# Patient Record
Sex: Male | Born: 1945 | Race: White | Hispanic: No | Smoking: Former smoker
Health system: Southern US, Community
[De-identification: ages and names within clinical notes are randomized; demographics above are authoritative.]

## PROBLEM LIST (undated history)

## (undated) DIAGNOSIS — G4730 Sleep apnea, unspecified: Secondary | ICD-10-CM

## (undated) DIAGNOSIS — IMO0001 Reserved for inherently not codable concepts without codable children: Secondary | ICD-10-CM

## (undated) DIAGNOSIS — Z973 Presence of spectacles and contact lenses: Secondary | ICD-10-CM

## (undated) DIAGNOSIS — I2699 Other pulmonary embolism without acute cor pulmonale: Principal | ICD-10-CM

## (undated) DIAGNOSIS — I82409 Acute embolism and thrombosis of unspecified deep veins of unspecified lower extremity: Secondary | ICD-10-CM

## (undated) DIAGNOSIS — N138 Other obstructive and reflux uropathy: Secondary | ICD-10-CM

## (undated) DIAGNOSIS — D693 Immune thrombocytopenic purpura: Secondary | ICD-10-CM

## (undated) DIAGNOSIS — N401 Enlarged prostate with lower urinary tract symptoms: Secondary | ICD-10-CM

## (undated) DIAGNOSIS — M1990 Unspecified osteoarthritis, unspecified site: Secondary | ICD-10-CM

## (undated) DIAGNOSIS — Z974 Presence of external hearing-aid: Secondary | ICD-10-CM

## (undated) DIAGNOSIS — D6851 Activated protein C resistance: Secondary | ICD-10-CM

## (undated) DIAGNOSIS — G20 Parkinson's disease: Principal | ICD-10-CM

## (undated) DIAGNOSIS — K219 Gastro-esophageal reflux disease without esophagitis: Secondary | ICD-10-CM

## (undated) DIAGNOSIS — C801 Malignant (primary) neoplasm, unspecified: Secondary | ICD-10-CM

## (undated) DIAGNOSIS — H269 Unspecified cataract: Secondary | ICD-10-CM

## (undated) DIAGNOSIS — Z7901 Long term (current) use of anticoagulants: Secondary | ICD-10-CM

## (undated) DIAGNOSIS — I499 Cardiac arrhythmia, unspecified: Secondary | ICD-10-CM

## (undated) DIAGNOSIS — D126 Benign neoplasm of colon, unspecified: Secondary | ICD-10-CM

## (undated) DIAGNOSIS — K449 Diaphragmatic hernia without obstruction or gangrene: Secondary | ICD-10-CM

## (undated) DIAGNOSIS — L408 Other psoriasis: Secondary | ICD-10-CM

## (undated) DIAGNOSIS — K5792 Diverticulitis of intestine, part unspecified, without perforation or abscess without bleeding: Secondary | ICD-10-CM

## (undated) HISTORY — DX: Benign neoplasm of colon, unspecified: D12.6

## (undated) HISTORY — DX: Diverticulitis of intestine, part unspecified, without perforation or abscess without bleeding: K57.92

## (undated) HISTORY — PX: PROSTATE SURGERY: SHX751

## (undated) HISTORY — DX: Activated protein C resistance: D68.51

## (undated) HISTORY — PX: HERNIA REPAIR: SHX51

## (undated) HISTORY — DX: Long term (current) use of anticoagulants: Z79.01

## (undated) HISTORY — DX: Parkinson's disease: G20

## (undated) HISTORY — DX: Gastro-esophageal reflux disease without esophagitis: K21.9

## (undated) HISTORY — DX: Benign prostatic hyperplasia with lower urinary tract symptoms: N40.1

## (undated) HISTORY — DX: Other pulmonary embolism without acute cor pulmonale: I26.99

## (undated) HISTORY — DX: Other obstructive and reflux uropathy: N13.8

## (undated) HISTORY — DX: Diaphragmatic hernia without obstruction or gangrene: K44.9

## (undated) HISTORY — PX: COLONOSCOPY: SHX174

## (undated) HISTORY — DX: Other psoriasis: L40.8

---

## 1998-06-16 DIAGNOSIS — I2699 Other pulmonary embolism without acute cor pulmonale: Secondary | ICD-10-CM

## 1998-06-16 DIAGNOSIS — I82409 Acute embolism and thrombosis of unspecified deep veins of unspecified lower extremity: Secondary | ICD-10-CM

## 1998-06-16 HISTORY — DX: Acute embolism and thrombosis of unspecified deep veins of unspecified lower extremity: I82.409

## 1998-06-16 HISTORY — DX: Other pulmonary embolism without acute cor pulmonale: I26.99

## 1999-02-07 ENCOUNTER — Ambulatory Visit (HOSPITAL_COMMUNITY): Admission: RE | Admit: 1999-02-07 | Discharge: 1999-02-07 | Payer: Self-pay | Admitting: Gastroenterology

## 1999-02-07 ENCOUNTER — Encounter (INDEPENDENT_AMBULATORY_CARE_PROVIDER_SITE_OTHER): Payer: Self-pay | Admitting: Specialist

## 1999-06-03 ENCOUNTER — Ambulatory Visit (HOSPITAL_COMMUNITY): Admission: RE | Admit: 1999-06-03 | Discharge: 1999-06-03 | Payer: Self-pay | Admitting: Gastroenterology

## 1999-06-03 ENCOUNTER — Encounter (INDEPENDENT_AMBULATORY_CARE_PROVIDER_SITE_OTHER): Payer: Self-pay

## 1999-10-22 ENCOUNTER — Inpatient Hospital Stay (HOSPITAL_COMMUNITY): Admission: EM | Admit: 1999-10-22 | Discharge: 1999-10-28 | Payer: Self-pay | Admitting: Internal Medicine

## 2000-10-01 ENCOUNTER — Encounter (INDEPENDENT_AMBULATORY_CARE_PROVIDER_SITE_OTHER): Payer: Self-pay | Admitting: Specialist

## 2000-10-01 ENCOUNTER — Ambulatory Visit (HOSPITAL_COMMUNITY): Admission: RE | Admit: 2000-10-01 | Discharge: 2000-10-01 | Payer: Self-pay | Admitting: Gastroenterology

## 2001-06-16 DIAGNOSIS — D693 Immune thrombocytopenic purpura: Secondary | ICD-10-CM

## 2001-06-16 HISTORY — DX: Immune thrombocytopenic purpura: D69.3

## 2001-10-03 ENCOUNTER — Inpatient Hospital Stay (HOSPITAL_COMMUNITY): Admission: AD | Admit: 2001-10-03 | Discharge: 2001-10-07 | Payer: Self-pay | Admitting: Family Medicine

## 2001-10-14 ENCOUNTER — Encounter: Admission: RE | Admit: 2001-10-14 | Discharge: 2001-10-14 | Payer: Self-pay | Admitting: Infectious Diseases

## 2002-06-16 HISTORY — PX: LUNG REMOVAL, PARTIAL: SHX233

## 2003-01-06 ENCOUNTER — Ambulatory Visit (HOSPITAL_COMMUNITY): Admission: RE | Admit: 2003-01-06 | Discharge: 2003-01-06 | Payer: Self-pay | Admitting: Internal Medicine

## 2003-01-29 ENCOUNTER — Inpatient Hospital Stay (HOSPITAL_COMMUNITY): Admission: RE | Admit: 2003-01-29 | Discharge: 2003-02-04 | Payer: Self-pay | Admitting: Thoracic Surgery

## 2003-01-30 ENCOUNTER — Encounter (INDEPENDENT_AMBULATORY_CARE_PROVIDER_SITE_OTHER): Payer: Self-pay | Admitting: *Deleted

## 2003-01-30 ENCOUNTER — Encounter: Payer: Self-pay | Admitting: Thoracic Surgery

## 2003-01-31 ENCOUNTER — Encounter: Payer: Self-pay | Admitting: Thoracic Surgery

## 2003-02-01 ENCOUNTER — Encounter: Payer: Self-pay | Admitting: Thoracic Surgery

## 2003-02-02 ENCOUNTER — Encounter: Payer: Self-pay | Admitting: Thoracic Surgery

## 2003-02-03 ENCOUNTER — Encounter: Payer: Self-pay | Admitting: Thoracic Surgery

## 2003-02-04 ENCOUNTER — Encounter: Payer: Self-pay | Admitting: Thoracic Surgery

## 2003-02-10 ENCOUNTER — Encounter: Admission: RE | Admit: 2003-02-10 | Discharge: 2003-02-10 | Payer: Self-pay | Admitting: Thoracic Surgery

## 2003-02-10 ENCOUNTER — Encounter: Payer: Self-pay | Admitting: Thoracic Surgery

## 2003-02-15 ENCOUNTER — Encounter: Admission: RE | Admit: 2003-02-15 | Discharge: 2003-02-15 | Payer: Self-pay | Admitting: Infectious Diseases

## 2003-03-02 ENCOUNTER — Encounter: Admission: RE | Admit: 2003-03-02 | Discharge: 2003-03-02 | Payer: Self-pay | Admitting: Thoracic Surgery

## 2003-03-02 ENCOUNTER — Encounter: Payer: Self-pay | Admitting: Thoracic Surgery

## 2003-03-30 ENCOUNTER — Encounter: Payer: Self-pay | Admitting: Thoracic Surgery

## 2003-03-30 ENCOUNTER — Encounter: Admission: RE | Admit: 2003-03-30 | Discharge: 2003-03-30 | Payer: Self-pay | Admitting: Thoracic Surgery

## 2003-04-17 ENCOUNTER — Encounter: Admission: RE | Admit: 2003-04-17 | Discharge: 2003-04-17 | Payer: Self-pay | Admitting: Infectious Diseases

## 2003-06-01 ENCOUNTER — Encounter: Admission: RE | Admit: 2003-06-01 | Discharge: 2003-06-01 | Payer: Self-pay | Admitting: Thoracic Surgery

## 2003-06-29 ENCOUNTER — Ambulatory Visit (HOSPITAL_BASED_OUTPATIENT_CLINIC_OR_DEPARTMENT_OTHER): Admission: RE | Admit: 2003-06-29 | Discharge: 2003-06-29 | Payer: Self-pay | Admitting: Emergency Medicine

## 2003-10-18 ENCOUNTER — Ambulatory Visit (HOSPITAL_COMMUNITY): Admission: RE | Admit: 2003-10-18 | Discharge: 2003-10-18 | Payer: Self-pay | Admitting: Gastroenterology

## 2003-10-18 ENCOUNTER — Encounter (INDEPENDENT_AMBULATORY_CARE_PROVIDER_SITE_OTHER): Payer: Self-pay | Admitting: Specialist

## 2003-11-29 ENCOUNTER — Encounter: Admission: RE | Admit: 2003-11-29 | Discharge: 2003-11-29 | Payer: Self-pay | Admitting: Thoracic Surgery

## 2004-05-30 ENCOUNTER — Ambulatory Visit: Payer: Self-pay | Admitting: *Deleted

## 2004-07-02 ENCOUNTER — Ambulatory Visit: Payer: Self-pay | Admitting: Internal Medicine

## 2004-07-30 ENCOUNTER — Ambulatory Visit: Payer: Self-pay | Admitting: Cardiology

## 2004-08-29 ENCOUNTER — Ambulatory Visit: Payer: Self-pay | Admitting: Cardiology

## 2004-10-01 ENCOUNTER — Ambulatory Visit: Payer: Self-pay | Admitting: Cardiology

## 2004-11-05 ENCOUNTER — Ambulatory Visit: Payer: Self-pay | Admitting: Internal Medicine

## 2004-12-10 ENCOUNTER — Ambulatory Visit: Payer: Self-pay | Admitting: Cardiology

## 2004-12-31 ENCOUNTER — Ambulatory Visit: Payer: Self-pay | Admitting: Cardiology

## 2005-01-28 ENCOUNTER — Ambulatory Visit: Payer: Self-pay | Admitting: Cardiology

## 2005-01-30 ENCOUNTER — Ambulatory Visit: Payer: Self-pay | Admitting: Internal Medicine

## 2005-02-25 ENCOUNTER — Ambulatory Visit: Payer: Self-pay | Admitting: *Deleted

## 2005-03-10 ENCOUNTER — Ambulatory Visit: Payer: Self-pay | Admitting: Internal Medicine

## 2005-04-01 ENCOUNTER — Ambulatory Visit: Payer: Self-pay | Admitting: Cardiology

## 2005-04-29 ENCOUNTER — Ambulatory Visit: Payer: Self-pay | Admitting: Internal Medicine

## 2005-05-27 ENCOUNTER — Ambulatory Visit: Payer: Self-pay | Admitting: Cardiology

## 2005-06-24 ENCOUNTER — Ambulatory Visit: Payer: Self-pay | Admitting: Cardiology

## 2005-07-22 ENCOUNTER — Ambulatory Visit: Payer: Self-pay | Admitting: Cardiology

## 2005-08-19 ENCOUNTER — Ambulatory Visit: Payer: Self-pay | Admitting: *Deleted

## 2005-08-28 ENCOUNTER — Ambulatory Visit: Payer: Self-pay | Admitting: Cardiology

## 2005-09-05 ENCOUNTER — Ambulatory Visit: Payer: Self-pay | Admitting: Cardiology

## 2005-09-16 ENCOUNTER — Ambulatory Visit: Payer: Self-pay

## 2005-10-02 ENCOUNTER — Ambulatory Visit: Payer: Self-pay | Admitting: Internal Medicine

## 2005-11-04 ENCOUNTER — Ambulatory Visit: Payer: Self-pay | Admitting: *Deleted

## 2005-12-01 ENCOUNTER — Ambulatory Visit: Payer: Self-pay | Admitting: Cardiology

## 2005-12-26 ENCOUNTER — Ambulatory Visit: Payer: Self-pay | Admitting: Cardiology

## 2006-01-23 ENCOUNTER — Ambulatory Visit: Payer: Self-pay | Admitting: Cardiology

## 2006-02-20 ENCOUNTER — Ambulatory Visit: Payer: Self-pay | Admitting: Cardiology

## 2006-03-17 ENCOUNTER — Ambulatory Visit: Payer: Self-pay | Admitting: *Deleted

## 2006-04-07 ENCOUNTER — Ambulatory Visit: Payer: Self-pay | Admitting: Internal Medicine

## 2006-05-05 ENCOUNTER — Ambulatory Visit: Payer: Self-pay | Admitting: Cardiovascular Disease

## 2006-06-02 ENCOUNTER — Ambulatory Visit: Payer: Self-pay | Admitting: Cardiology

## 2006-06-26 ENCOUNTER — Ambulatory Visit: Payer: Self-pay | Admitting: Internal Medicine

## 2006-07-23 ENCOUNTER — Ambulatory Visit: Payer: Self-pay | Admitting: Cardiology

## 2006-08-07 ENCOUNTER — Ambulatory Visit: Payer: Self-pay | Admitting: Internal Medicine

## 2007-03-03 ENCOUNTER — Observation Stay (HOSPITAL_COMMUNITY): Admission: AD | Admit: 2007-03-03 | Discharge: 2007-03-04 | Payer: Self-pay | Admitting: Emergency Medicine

## 2007-03-03 ENCOUNTER — Ambulatory Visit: Payer: Self-pay | Admitting: Family Medicine

## 2007-03-04 ENCOUNTER — Encounter: Payer: Self-pay | Admitting: Family Medicine

## 2007-03-04 ENCOUNTER — Ambulatory Visit: Payer: Self-pay | Admitting: Surgery

## 2007-03-08 ENCOUNTER — Encounter: Payer: Self-pay | Admitting: Cardiology

## 2007-03-08 LAB — CONVERTED CEMR LAB
Basophils Absolute: 0 10*3/uL (ref 0.0–0.1)
Basophils Relative: 0.4 % (ref 0.0–1.0)
Eosinophils Absolute: 0.8 10*3/uL — ABNORMAL HIGH (ref 0.0–0.6)
Eosinophils Relative: 13.5 % — ABNORMAL HIGH (ref 0.0–5.0)
HCT: 39.2 % (ref 39.0–52.0)
Hemoglobin: 13.3 g/dL (ref 13.0–17.0)
INR: 2.2 — ABNORMAL HIGH (ref 0.8–1.0)
Lymphocytes Relative: 29.4 % (ref 12.0–46.0)
MCHC: 33.9 g/dL (ref 30.0–36.0)
MCV: 95.6 fL (ref 78.0–100.0)
Monocytes Absolute: 0.5 10*3/uL (ref 0.2–0.7)
Monocytes Relative: 8.5 % (ref 3.0–11.0)
Neutro Abs: 2.7 10*3/uL (ref 1.4–7.7)
Neutrophils Relative %: 48.2 % (ref 43.0–77.0)
Platelets: 241 10*3/uL (ref 150–400)
Prothrombin Time: 18.6 s — ABNORMAL HIGH (ref 10.9–13.3)
RBC: 4.1 M/uL — ABNORMAL LOW (ref 4.22–5.81)
RDW: 12.7 % (ref 11.5–14.6)
WBC: 5.6 10*3/uL (ref 4.5–10.5)

## 2007-03-15 ENCOUNTER — Ambulatory Visit: Payer: Self-pay | Admitting: Cardiovascular Disease

## 2007-03-26 ENCOUNTER — Ambulatory Visit: Payer: Self-pay | Admitting: Cardiology

## 2007-04-06 ENCOUNTER — Ambulatory Visit: Payer: Self-pay | Admitting: Cardiology

## 2007-04-27 ENCOUNTER — Ambulatory Visit: Payer: Self-pay | Admitting: Cardiovascular Disease

## 2007-05-27 ENCOUNTER — Ambulatory Visit: Payer: Self-pay | Admitting: Cardiology

## 2007-06-24 ENCOUNTER — Ambulatory Visit: Payer: Self-pay | Admitting: Internal Medicine

## 2007-07-22 ENCOUNTER — Ambulatory Visit: Payer: Self-pay | Admitting: Cardiology

## 2007-08-19 ENCOUNTER — Ambulatory Visit: Payer: Self-pay | Admitting: Cardiovascular Disease

## 2007-09-16 ENCOUNTER — Ambulatory Visit: Payer: Self-pay | Admitting: Internal Medicine

## 2007-10-14 ENCOUNTER — Ambulatory Visit: Payer: Self-pay | Admitting: Cardiology

## 2007-11-11 ENCOUNTER — Ambulatory Visit: Payer: Self-pay | Admitting: Cardiology

## 2007-12-09 ENCOUNTER — Ambulatory Visit: Payer: Self-pay | Admitting: Cardiology

## 2007-12-09 LAB — CONVERTED CEMR LAB
INR: 6.5 (ref 0.8–1.0)
Prothrombin Time: 62.2 s (ref 10.9–13.3)

## 2007-12-14 ENCOUNTER — Ambulatory Visit: Payer: Self-pay | Admitting: Internal Medicine

## 2007-12-20 ENCOUNTER — Ambulatory Visit: Payer: Self-pay | Admitting: Internal Medicine

## 2007-12-24 ENCOUNTER — Ambulatory Visit: Payer: Self-pay | Admitting: Cardiovascular Disease

## 2007-12-31 ENCOUNTER — Ambulatory Visit: Payer: Self-pay | Admitting: Cardiology

## 2008-01-14 ENCOUNTER — Ambulatory Visit: Payer: Self-pay | Admitting: Cardiology

## 2008-02-11 ENCOUNTER — Ambulatory Visit: Payer: Self-pay | Admitting: Cardiology

## 2008-02-18 ENCOUNTER — Ambulatory Visit: Payer: Self-pay | Admitting: Cardiovascular Disease

## 2008-02-25 ENCOUNTER — Ambulatory Visit: Payer: Self-pay | Admitting: Internal Medicine

## 2008-03-03 ENCOUNTER — Ambulatory Visit: Payer: Self-pay | Admitting: Cardiology

## 2008-03-17 ENCOUNTER — Ambulatory Visit: Payer: Self-pay | Admitting: Cardiovascular Disease

## 2008-03-31 ENCOUNTER — Ambulatory Visit: Payer: Self-pay | Admitting: Cardiology

## 2008-04-28 ENCOUNTER — Ambulatory Visit: Payer: Self-pay | Admitting: Internal Medicine

## 2008-05-26 ENCOUNTER — Ambulatory Visit: Payer: Self-pay | Admitting: Cardiovascular Disease

## 2008-06-19 ENCOUNTER — Ambulatory Visit: Payer: Self-pay | Admitting: Internal Medicine

## 2008-07-17 ENCOUNTER — Ambulatory Visit: Payer: Self-pay | Admitting: Cardiology

## 2008-08-14 ENCOUNTER — Ambulatory Visit: Payer: Self-pay | Admitting: Cardiology

## 2008-09-08 ENCOUNTER — Ambulatory Visit: Payer: Self-pay | Admitting: Cardiology

## 2008-10-06 ENCOUNTER — Ambulatory Visit: Payer: Self-pay | Admitting: Cardiology

## 2008-10-27 ENCOUNTER — Ambulatory Visit: Payer: Self-pay | Admitting: Cardiology

## 2008-11-14 ENCOUNTER — Encounter: Payer: Self-pay | Admitting: *Deleted

## 2008-11-24 ENCOUNTER — Ambulatory Visit: Payer: Self-pay | Admitting: Cardiovascular Disease

## 2008-11-24 LAB — CONVERTED CEMR LAB
POC INR: 2.2
Protime: 18.2

## 2008-12-19 ENCOUNTER — Ambulatory Visit: Payer: Self-pay | Admitting: Cardiology

## 2008-12-19 LAB — CONVERTED CEMR LAB
POC INR: 2.4
Prothrombin Time: 18.8 s

## 2008-12-20 ENCOUNTER — Encounter: Payer: Self-pay | Admitting: *Deleted

## 2009-01-16 ENCOUNTER — Ambulatory Visit: Payer: Self-pay | Admitting: Cardiology

## 2009-01-16 LAB — CONVERTED CEMR LAB
POC INR: 1.9
Prothrombin Time: 17.1 s

## 2009-02-14 ENCOUNTER — Ambulatory Visit: Payer: Self-pay | Admitting: Internal Medicine

## 2009-02-14 LAB — CONVERTED CEMR LAB: POC INR: 3.1

## 2009-03-14 ENCOUNTER — Ambulatory Visit: Payer: Self-pay | Admitting: Cardiovascular Disease

## 2009-03-14 LAB — CONVERTED CEMR LAB: POC INR: 3

## 2009-04-06 ENCOUNTER — Ambulatory Visit: Payer: Self-pay | Admitting: Cardiology

## 2009-04-06 LAB — CONVERTED CEMR LAB: POC INR: 2.9

## 2009-05-04 ENCOUNTER — Ambulatory Visit: Payer: Self-pay | Admitting: Cardiology

## 2009-05-04 LAB — CONVERTED CEMR LAB: POC INR: 3.3

## 2009-06-01 ENCOUNTER — Ambulatory Visit: Payer: Self-pay | Admitting: Cardiovascular Disease

## 2009-06-01 LAB — CONVERTED CEMR LAB: POC INR: 2.9

## 2009-06-29 ENCOUNTER — Ambulatory Visit: Payer: Self-pay | Admitting: Internal Medicine

## 2009-07-27 ENCOUNTER — Encounter (INDEPENDENT_AMBULATORY_CARE_PROVIDER_SITE_OTHER): Payer: Self-pay | Admitting: Cardiology

## 2009-07-27 ENCOUNTER — Ambulatory Visit: Payer: Self-pay | Admitting: Internal Medicine

## 2009-08-16 ENCOUNTER — Ambulatory Visit: Payer: Self-pay | Admitting: Cardiology

## 2009-09-13 ENCOUNTER — Ambulatory Visit: Payer: Self-pay | Admitting: Internal Medicine

## 2009-10-11 ENCOUNTER — Ambulatory Visit: Payer: Self-pay | Admitting: Cardiology

## 2009-11-08 ENCOUNTER — Ambulatory Visit: Payer: Self-pay | Admitting: Cardiology

## 2009-11-08 LAB — CONVERTED CEMR LAB: POC INR: 2.4

## 2009-12-03 ENCOUNTER — Ambulatory Visit: Payer: Self-pay | Admitting: Vascular Surgery

## 2009-12-03 ENCOUNTER — Encounter (INDEPENDENT_AMBULATORY_CARE_PROVIDER_SITE_OTHER): Payer: Self-pay | Admitting: General Surgery

## 2009-12-03 ENCOUNTER — Inpatient Hospital Stay (HOSPITAL_COMMUNITY): Admission: EM | Admit: 2009-12-03 | Discharge: 2009-12-06 | Payer: Self-pay | Admitting: Emergency Medicine

## 2009-12-25 ENCOUNTER — Ambulatory Visit: Payer: Self-pay | Admitting: Cardiovascular Disease

## 2009-12-25 LAB — CONVERTED CEMR LAB: POC INR: 1.9

## 2010-01-18 ENCOUNTER — Ambulatory Visit: Payer: Self-pay | Admitting: Cardiovascular Disease

## 2010-02-15 ENCOUNTER — Ambulatory Visit: Payer: Self-pay | Admitting: Cardiology

## 2010-02-15 LAB — CONVERTED CEMR LAB
INR: 3.7
POC INR: 3.7

## 2010-02-25 ENCOUNTER — Ambulatory Visit: Payer: Self-pay | Admitting: Internal Medicine

## 2010-03-22 ENCOUNTER — Ambulatory Visit: Payer: Self-pay | Admitting: Cardiology

## 2010-04-19 ENCOUNTER — Ambulatory Visit: Payer: Self-pay | Admitting: Cardiology

## 2010-04-19 LAB — CONVERTED CEMR LAB: POC INR: 1.8

## 2010-05-14 ENCOUNTER — Telehealth: Payer: Self-pay | Admitting: Cardiology

## 2010-05-17 ENCOUNTER — Ambulatory Visit: Payer: Self-pay | Admitting: Cardiovascular Disease

## 2010-06-03 DIAGNOSIS — D6851 Activated protein C resistance: Secondary | ICD-10-CM | POA: Insufficient documentation

## 2010-06-03 DIAGNOSIS — N401 Enlarged prostate with lower urinary tract symptoms: Secondary | ICD-10-CM | POA: Insufficient documentation

## 2010-06-03 DIAGNOSIS — K219 Gastro-esophageal reflux disease without esophagitis: Secondary | ICD-10-CM | POA: Insufficient documentation

## 2010-06-03 DIAGNOSIS — K449 Diaphragmatic hernia without obstruction or gangrene: Secondary | ICD-10-CM | POA: Insufficient documentation

## 2010-06-03 DIAGNOSIS — D682 Hereditary deficiency of other clotting factors: Secondary | ICD-10-CM

## 2010-06-03 DIAGNOSIS — L408 Other psoriasis: Secondary | ICD-10-CM | POA: Insufficient documentation

## 2010-06-03 DIAGNOSIS — D126 Benign neoplasm of colon, unspecified: Secondary | ICD-10-CM | POA: Insufficient documentation

## 2010-06-03 DIAGNOSIS — N138 Other obstructive and reflux uropathy: Secondary | ICD-10-CM | POA: Insufficient documentation

## 2010-06-04 ENCOUNTER — Ambulatory Visit: Payer: Self-pay | Admitting: Cardiology

## 2010-06-04 ENCOUNTER — Encounter: Payer: Self-pay | Admitting: Cardiology

## 2010-06-11 ENCOUNTER — Telehealth: Payer: Self-pay | Admitting: Cardiology

## 2010-06-14 ENCOUNTER — Ambulatory Visit: Payer: Self-pay | Admitting: Cardiovascular Disease

## 2010-06-28 ENCOUNTER — Ambulatory Visit
Admission: RE | Admit: 2010-06-28 | Discharge: 2010-06-28 | Payer: Self-pay | Source: Home / Self Care | Attending: Urology | Admitting: Urology

## 2010-06-28 ENCOUNTER — Ambulatory Visit (HOSPITAL_COMMUNITY)
Admission: EM | Admit: 2010-06-28 | Discharge: 2010-06-29 | Payer: Self-pay | Source: Home / Self Care | Attending: Urology | Admitting: Urology

## 2010-07-01 LAB — PROTIME-INR
INR: 1.02 (ref 0.00–1.49)
Prothrombin Time: 13.6 seconds (ref 11.6–15.2)

## 2010-07-01 LAB — POCT HEMOGLOBIN-HEMACUE: Hemoglobin: 15.2 g/dL (ref 13.0–17.0)

## 2010-07-08 ENCOUNTER — Ambulatory Visit: Admission: RE | Admit: 2010-07-08 | Discharge: 2010-07-08 | Payer: Self-pay | Source: Home / Self Care

## 2010-07-16 NOTE — Medication Information (Signed)
Summary: rov/tm  Anticoagulant Therapy  Managed by: Cloyde Reams, RN, BSN Referring MD: Olga Millers MD Supervising MD: Excell Seltzer MD, Casimiro Needle Indication 1: Pulmonary embolus (ICD-415.19) Lab Used: [facility] Site: [facility] INR POC 1.9 INR RANGE 2 - 3  Dietary changes: no    Health status changes: no    Bleeding/hemorrhagic complications: no    Recent/future hospitalizations: no    Any changes in medication regimen? no    Recent/future dental: no  Any missed doses?: no       Is patient compliant with meds? yes       Allergies: No Known Drug Allergies  Anticoagulation Management History:      The patient is taking warfarin and comes in today for a routine follow up visit.  Negative risk factors for bleeding include an age less than 65 years old.  The bleeding index is 'low risk'.  Negative CHADS2 values include Age > 65 years old.  The start date was 10/24/1999.  His last INR was 6.5 RATIO.  Anticoagulation responsible provider: Excell Seltzer MD, Casimiro Needle.  INR POC: 1.9.  Cuvette Lot#: [id number].  Exp: 03/2011.    Anticoagulation Management Assessment/Plan:      The patient's current anticoagulation dose is Warfarin sodium 5 mg tabs: Take as directed.  The target INR is 2 - 3.  The next INR is due 02/15/2010.  Anticoagulation instructions were given to patient.  Results were reviewed/authorized by Cloyde Reams, RN, BSN.  He was notified by Cloyde Reams RN.         Prior Anticoagulation Instructions: INR 1.9 Today take 7.5mg s then resume 5mg s everyday except 7.5mg s on Mondays and Fridays. REcheck in 4 weeks.   Current Anticoagulation Instructions: INR 1.9  Take 10mg  today, then resume same dosage 5mg  daily except 7.5mg  on Mondays and Fridays.  Recheck in 4 weeks.

## 2010-07-16 NOTE — Medication Information (Signed)
Summary: rov/ewj  Anticoagulant Therapy  Managed by: Shelby Dubin, PharmD, BCPS, CPP Referring MD: Olga Millers MD Supervising MD: Gala Romney MD, Reuel Boom Indication 1: Pulmonary embolus (ICD-415.19) Lab Used: [facility] Site: [facility] INR POC 2.6 INR RANGE 2 - 3  Dietary changes: no    Health status changes: no    Bleeding/hemorrhagic complications: no    Recent/future hospitalizations: no    Any changes in medication regimen? no    Recent/future dental: no  Any missed doses?: no       Is patient compliant with meds? yes       Allergies (verified): No Known Drug Allergies  Anticoagulation Management History:      The patient is taking warfarin and comes in today for a routine follow up visit.  Negative risk factors for bleeding include an age less than 55 years old.  The bleeding index is 'low risk'.  Negative CHADS2 values include Age > 12 years old.  The start date was 10/24/1999.  His last INR was 6.5 RATIO.  Anticoagulation responsible provider: Bensimhon MD, Reuel Boom.  INR POC: 2.6.  Cuvette Lot#: [id number].  Exp: 09/2010.    Anticoagulation Management Assessment/Plan:      The patient's current anticoagulation dose is Warfarin sodium 5 mg tabs: Take as directed.  The target INR is 2 - 3.  The next INR is due 07/27/2009.  Anticoagulation instructions were given to patient.  Results were reviewed/authorized by Shelby Dubin, PharmD, BCPS, CPP.  He was notified by Ysidro Evert, Pharm D Candidate.         Prior Anticoagulation Instructions: INR 2.9  Continue on same dosage 1 tablet daily except 1.5 tablets on Mondays and Fridays.   Recheck in 4 weeks.    Current Anticoagulation Instructions: INR: 2.6 Continue with same dosage of 5mg  daily except 7.5mg  on Mondays and Fridays Recheck in 4 weeks

## 2010-07-16 NOTE — Medication Information (Signed)
Summary: rov.mp  Anticoagulant Therapy  Managed by: Cloyde Reams, RN, BSN Referring MD: Olga Millers MD Supervising MD: Jens Som MD, Arlys John Indication 1: Pulmonary embolus (ICD-415.19) Lab Used: [facility] Site: [facility] INR POC 2.5 INR RANGE 2 - 3  Dietary changes: no    Health status changes: no    Bleeding/hemorrhagic complications: no    Recent/future hospitalizations: no    Any changes in medication regimen? no    Recent/future dental: no  Any missed doses?: no       Is patient compliant with meds? yes       Allergies (verified): No Known Drug Allergies  Anticoagulation Management History:      The patient is taking warfarin and comes in today for a routine follow up visit.  Negative risk factors for bleeding include an age less than 63 years old.  The bleeding index is 'low risk'.  Negative CHADS2 values include Age > 37 years old.  The start date was 10/24/1999.  His last INR was 6.5 RATIO.  Anticoagulation responsible provider: Jens Som MD, Arlys John.  INR POC: 2.5.  Cuvette Lot#: [id number].  Exp: 10/2010.    Anticoagulation Management Assessment/Plan:      The patient's current anticoagulation dose is Warfarin sodium 5 mg tabs: Take as directed.  The target INR is 2 - 3.  The next INR is due 09/13/2009.  Anticoagulation instructions were given to patient.  Results were reviewed/authorized by Cloyde Reams, RN, BSN.  He was notified by Cloyde Reams RN.         Prior Anticoagulation Instructions: INR 2.7  Continue 1.5 tabs each Monday and Friday and 1 tab on all other days.  Recheck in 3 - 4 weeks.    Current Anticoagulation Instructions: INR 2.5  Continue on same dosage 1 tablet daily except 1.5 tablets on Mondays and Fridays.  Recheck in 4 weeks.   Prescriptions: WARFARIN SODIUM 5 MG TABS (WARFARIN SODIUM) Take as directed  #135 x 1   Entered by:   Cloyde Reams RN   Authorized by:   Ferman Hamming, MD, [facility]   Signed by:   Cloyde Reams RN on  08/16/2009   Method used:   Electronically to        [facility] (retail)       [address]       [place], [place]  [address]       Ph: [phone]       Fax: [phone]   RxID:   [id number]

## 2010-07-16 NOTE — Medication Information (Signed)
Summary: rov/ewj  Anticoagulant Therapy  Managed by: Weston Brass, PharmD Referring MD: Olga Millers MD Supervising MD: Excell Seltzer MD, Casimiro Needle Indication 1: Pulmonary embolus (ICD-415.19) Lab Used: [facility] Site: [facility] INR POC 3.0 INR RANGE 2 - 3  Dietary changes: no    Health status changes: yes       Details: pending procedure by Dr. Warner Mccreedy  Bleeding/hemorrhagic complications: no    Recent/future hospitalizations: no    Any changes in medication regimen? no    Recent/future dental: no  Any missed doses?: no       Is patient compliant with meds? yes       Allergies: No Known Drug Allergies  Anticoagulation Management History:      The patient is taking warfarin and comes in today for a routine follow up visit.  Negative risk factors for bleeding include an age less than 51 years old.  The bleeding index is 'low risk'.  Negative CHADS2 values include Age > 54 years old.  The start date was 10/24/1999.  His last INR was 3.7.  Anticoagulation responsible provider: Excell Seltzer MD, Casimiro Needle.  INR POC: 3.0.  Cuvette Lot#: [id number].  Exp: 05/2011.    Anticoagulation Management Assessment/Plan:      The patient's current anticoagulation dose is Warfarin sodium 5 mg tabs: Take as directed.  The target INR is 2 - 3.  The next INR is due 06/14/2010.  Anticoagulation instructions were given to patient.  Results were reviewed/authorized by Weston Brass, PharmD.  He was notified by Weston Brass PharmD.         Prior Anticoagulation Instructions: INR 1.8  Take 2 tablets today, then resume same dosage 1 tablet daily except  1.5 tablets on Mondays and Fridays.  Recheck in 4 weeks.   Current Anticoagulation Instructions: INR 3.0  Continue same dose of 1 tablet every day except 1 1/2 tablets on Monday and Friday.  Recheck INR in 4 weeks.
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
## 2010-07-16 NOTE — Medication Information (Signed)
Summary: rov.mp  Anticoagulant Therapy  Managed by: Shelby Dubin, PharmD, BCPS, CPP Referring MD: Olga Millers MD Supervising MD: Tenny Craw MD, Gunnar Fusi Indication 1: Pulmonary embolus (ICD-415.19) Lab Used: [facility] Site: [facility] INR POC 2.7 INR RANGE 2 - 3  Dietary changes: no    Health status changes: no    Bleeding/hemorrhagic complications: no    Recent/future hospitalizations: no    Any changes in medication regimen? yes       Details: current med update:  continues on naproxen and flexeril for cervical pain, has finished amoxicillin course and continues on flonase for URI/ear infection.    Recent/future dental: no    Comments: have finished oral typhoid vaccination round.    Current Medications (verified): 1)  Warfarin Sodium 5 Mg Tabs (Warfarin Sodium) .... Take As Directed 2)  Flomax 0.4 Mg Xr24h-Cap (Tamsulosin Hcl) .... Take 2 Tablets By Mouth Once Daily  Allergies: No Known Drug Allergies  Anticoagulation Management History:      The patient is taking warfarin and comes in today for a routine follow up visit.  Negative risk factors for bleeding include an age less than 20 years old.  The bleeding index is 'low risk'.  Negative CHADS2 values include Age > 5 years old.  The start date was 10/24/1999.  His last INR was 6.5 RATIO.  Anticoagulation responsible provider: Tenny Craw MD, Gunnar Fusi.  INR POC: 2.7.  Cuvette Lot#: [id number].  Exp: 09/2010.    Anticoagulation Management Assessment/Plan:      The patient's current anticoagulation dose is Warfarin sodium 5 mg tabs: Take as directed.  The target INR is 2 - 3.  The next INR is due 08/24/2009.  Anticoagulation instructions were given to patient.  Results were reviewed/authorized by Shelby Dubin, PharmD, BCPS, CPP.  He was notified by Shelby Dubin PharmD, BCPS, CPP.         Prior Anticoagulation Instructions: INR: 2.6 Continue with same dosage of 5mg  daily except 7.5mg  on Mondays and Fridays Recheck in 4 weeks  Current  Anticoagulation Instructions: INR 2.7  Continue 1.5 tabs each Monday and Friday and 1 tab on all other days.  Recheck in 3 - 4 weeks.

## 2010-07-16 NOTE — Medication Information (Signed)
Summary: rov/ewj  Anticoagulant Therapy  Managed by: Cloyde Reams, RN, BSN Referring MD: Olga Millers MD Supervising MD: Daleen Squibb MD,Thomas Indication 1: Pulmonary embolus (ICD-415.19) Lab Used: [facility] Site: [facility] INR POC 3.7 INR RANGE 2 - 3  Dietary changes: no    Health status changes: yes       Details: diverticulitis flareup  Bleeding/hemorrhagic complications: no    Recent/future hospitalizations: no    Any changes in medication regimen? yes       Details: started cipro on 08/29 stop date 9/07  Recent/future dental: no  Any missed doses?: no       Is patient compliant with meds? yes       Allergies: No Known Drug Allergies  Anticoagulation Management History:      The patient is taking warfarin and comes in today for a routine follow up visit.  Negative risk factors for bleeding include an age less than 64 years old.  The bleeding index is 'low risk'.  Negative CHADS2 values include Age > 59 years old.  The start date was 10/24/1999.  His last INR was 6.5 RATIO and today's INR is 3.7.  Anticoagulation responsible provider: Wall MD,Thomas.  INR POC: 3.7.  Cuvette Lot#: [id number].  Exp: 03/2011.    Anticoagulation Management Assessment/Plan:      The patient's current anticoagulation dose is Warfarin sodium 5 mg tabs: Take as directed.  The target INR is 2 - 3.  The next INR is due 02/25/2010.  Anticoagulation instructions were given to patient.  Results were reviewed/authorized by Cloyde Reams, RN, BSN.         Prior Anticoagulation Instructions: INR 1.9  Take 10mg  today, then resume same dosage 5mg  daily except 7.5mg  on Mondays and Fridays.  Recheck in 4 weeks.    Current Anticoagulation Instructions: INR 3.7 Do not take your dose today. Do a 1/2 tablet on saturday. Keep sunday the same at 1 tablet. On monday go down to 1 tablet instead of the 1.5. Continue 1 tablet on tuesday, wednesday, and thursday, and 1.5 tablets on friday.

## 2010-07-16 NOTE — Medication Information (Signed)
Summary: rov coumadin - [facility]  Anticoagulant Therapy  Managed by: Eda Keys, PharmD Referring MD: Olga Millers MD Supervising MD: Daleen Squibb MD, Maisie Fus Indication 1: Pulmonary embolus (ICD-415.19) Lab Used: [facility] Site: [facility] INR POC 2.4 INR RANGE 2 - 3  Dietary changes: no    Health status changes: no    Bleeding/hemorrhagic complications: no    Recent/future hospitalizations: no    Any changes in medication regimen? no    Recent/future dental: no  Any missed doses?: no       Is patient compliant with meds? yes       Allergies: No Known Drug Allergies  Anticoagulation Management History:      The patient is taking warfarin and comes in today for a routine follow up visit.  Negative risk factors for bleeding include an age less than 65 years old.  The bleeding index is 'low risk'.  Negative CHADS2 values include Age > 89 years old.  The start date was 10/24/1999.  His last INR was 6.5 RATIO.  Anticoagulation responsible provider: Daleen Squibb MD, Maisie Fus.  INR POC: 2.4.  Cuvette Lot#: [id number].  Exp: 01/2011.    Anticoagulation Management Assessment/Plan:      The patient's current anticoagulation dose is Warfarin sodium 5 mg tabs: Take as directed.  The target INR is 2 - 3.  The next INR is due 12/06/2009.  Anticoagulation instructions were given to patient.  Results were reviewed/authorized by Eda Keys, PharmD.  He was notified by Eda Keys.         Prior Anticoagulation Instructions: INR 2.0  Coumadin 1 and 1/2 tab on Mon and Fri 1 tab = 5mg  all other days  Current Anticoagulation Instructions: INR 2.4  Continue taking 1.5 tablets (7.5 mg) on Monday and Friday, and take 1 tablet (5 mg) all other days.  Return to clinic in 4 weeks.

## 2010-07-16 NOTE — Medication Information (Signed)
Summary: rov/sel  Anticoagulant Therapy  Managed by: Cloyde Reams, RN, BSN Referring MD: Olga Millers MD Supervising MD: Juanda Chance MD, Bruce Indication 1: Pulmonary embolus (ICD-415.19) Lab Used: [facility] Site: [facility] INR POC 1.8 INR RANGE 2 - 3  Dietary changes: no    Health status changes: no    Bleeding/hemorrhagic complications: no    Recent/future hospitalizations: no    Any changes in medication regimen? no    Recent/future dental: no  Any missed doses?: no       Is patient compliant with meds? yes       Allergies: No Known Drug Allergies  Anticoagulation Management History:      The patient is taking warfarin and comes in today for a routine follow up visit.  Negative risk factors for bleeding include an age less than 22 years old.  The bleeding index is 'low risk'.  Negative CHADS2 values include Age > 4 years old.  The start date was 10/24/1999.  His last INR was 3.7.  Anticoagulation responsible Tinsleigh Slovacek: Juanda Chance MD, Smitty Cords.  INR POC: 1.8.  Cuvette Lot#: [id number].  Exp: 04/2011.    Anticoagulation Management Assessment/Plan:      The patient's current anticoagulation dose is Warfarin sodium 5 mg tabs: Take as directed.  The target INR is 2 - 3.  The next INR is due 05/17/2010.  Anticoagulation instructions were given to patient.  Results were reviewed/authorized by Cloyde Reams, RN, BSN.  He was notified by Cloyde Reams RN.         Prior Anticoagulation Instructions: INR 2.1  Continue taking 1 tablet everyday except 1 1/2 tablets on Monday and Friday. Recheck in 4 weeks.   Current Anticoagulation Instructions: INR 1.8  Take 2 tablets today, then resume same dosage 1 tablet daily except  1.5 tablets on Mondays and Fridays.  Recheck in 4 weeks.  Prescriptions: WARFARIN SODIUM 5 MG TABS (WARFARIN SODIUM) Take as directed  #135 x 1   Entered by:   Cloyde Reams RN   Authorized by:   Ferman Hamming, MD, [facility]   Signed by:   Cloyde Reams RN on  04/19/2010   Method used:   Electronically to        [facility] (retail)       [address]       [place], [place]  [address]       Ph: [phone]       Fax: [phone]   RxID:   [id number]

## 2010-07-16 NOTE — Letter (Signed)
Summary: Handout Printed  Printed Handout:  - Coumadin Instructions-w/out Meds 

## 2010-07-16 NOTE — Progress Notes (Signed)
Summary: stop coumadin  Phone Note From Other Clinic   Caller: nurse pam Summary of Call: Per Pam Dr Warner Mccreedy pt needs to stop coumadin 5 days prior to his TURP possible 12/9 for procedure [phone] fax [phone] Initial call taken by: Edman Circle,  May 14, 2010 2:02 PM  Follow-up for Phone Call        [facility] for pt stating will need appt with Dr. Jens Som before he can be cleared for surgery.  Has not seen Dr. Jens Som for several years.  Pt has made appt on 12/20.  Follow-up by: Weston Brass PharmD,  May 15, 2010 3:10 PM

## 2010-07-16 NOTE — Medication Information (Signed)
Summary: rov/tm  Anticoagulant Therapy  Managed by: Bethena Midget, RN, BSN Referring MD: Olga Millers MD Supervising MD: Clifton James MD, Cristal Deer Indication 1: Pulmonary embolus (ICD-415.19) Lab Used: [facility] Site: [facility] INR POC 3.0 INR RANGE 2 - 3  Dietary changes: no    Health status changes: no    Bleeding/hemorrhagic complications: no    Recent/future hospitalizations: no    Any changes in medication regimen? no    Recent/future dental: no  Any missed doses?: no       Is patient compliant with meds? yes       Allergies (verified): No Known Drug Allergies  Anticoagulation Management History:      The patient is taking warfarin and comes in today for a routine follow up visit.  Negative risk factors for bleeding include an age less than 25 years old.  The bleeding index is 'low risk'.  Negative CHADS2 values include Age > 10 years old.  The start date was 10/24/1999.  His last INR was 6.5 RATIO.  Anticoagulation responsible provider: Clifton James MD, Cristal Deer.  INR POC: 3.0.  Cuvette Lot#: [id number].  Exp: 04/2010.    Anticoagulation Management Assessment/Plan:      The patient's current anticoagulation dose is Warfarin sodium 5 mg tabs: Take as directed.  The target INR is 2 - 3.  The next INR is due 04/06/2009.  Anticoagulation instructions were given to patient.  Results were reviewed/authorized by Bethena Midget, RN, BSN.  He was notified by Bethena Midget, RN, BSN.         Prior Anticoagulation Instructions: Today take 7.5mg s then resume  Current Anticoagulation Instructions: INR 3.0 Today take 2.5mg s then :  Continue 5mg s everyday except 7.5mg s on Mondays and Fridays.

## 2010-07-16 NOTE — Medication Information (Signed)
Summary: [facility]  Anticoagulant Therapy  Managed by: Weston Brass, PharmD Referring MD: Olga Millers MD Supervising MD: Juanda Chance MD, Bruce Indication 1: Pulmonary embolus (ICD-415.19) Lab Used: [facility] Site: [facility] INR POC 2.1 INR RANGE 2 - 3  Dietary changes: no    Health status changes: no    Bleeding/hemorrhagic complications: no    Recent/future hospitalizations: no    Any changes in medication regimen? no    Recent/future dental: no  Any missed doses?: no       Is patient compliant with meds? yes       Allergies: No Known Drug Allergies  Anticoagulation Management History:      The patient is taking warfarin and comes in today for a routine follow up visit.  Negative risk factors for bleeding include an age less than 59 years old.  The bleeding index is 'low risk'.  Negative CHADS2 values include Age > 70 years old.  The start date was 10/24/1999.  His last INR was 3.7.  Anticoagulation responsible provider: Juanda Chance MD, Smitty Cords.  INR POC: 2.1.  Cuvette Lot#: [id number].  Exp: 04/2011.    Anticoagulation Management Assessment/Plan:      The patient's current anticoagulation dose is Warfarin sodium 5 mg tabs: Take as directed.  The target INR is 2 - 3.  The next INR is due 04/19/2010.  Anticoagulation instructions were given to patient.  Results were reviewed/authorized by Weston Brass, PharmD.  He was notified by Ilean Skill D.         Prior Anticoagulation Instructions: INR 2.1  Continue taking 1 tablet everyday except take 1 1/2 tablets on Mondays and Fridays. Re-check INR in 4 weeks.   Current Anticoagulation Instructions: INR 2.1  Continue taking 1 tablet everyday except 1 1/2 tablets on Monday and Friday. Recheck in 4 weeks.

## 2010-07-16 NOTE — Medication Information (Signed)
Summary: rov/eac  Anticoagulant Therapy  Managed by: Leota Sauers, PharmD, BCPS, CPP Referring MD: Olga Millers MD Supervising MD: Daleen Squibb MD, Maisie Fus Indication 1: Pulmonary embolus (ICD-415.19) Lab Used: [facility] Site: [facility] INR POC 2.0 INR RANGE 2 - 3  Dietary changes: no    Health status changes: no    Bleeding/hemorrhagic complications: no    Recent/future hospitalizations: no    Any changes in medication regimen? no    Recent/future dental: no  Any missed doses?: no       Is patient compliant with meds? yes       Current Medications (verified): 1)  Warfarin Sodium 5 Mg Tabs (Warfarin Sodium) .... Take As Directed 2)  Flomax 0.4 Mg Xr24h-Cap (Tamsulosin Hcl) .... Take 2 Tablets By Mouth Once Daily  Allergies (verified): No Known Drug Allergies  Anticoagulation Management History:      The patient is taking warfarin and comes in today for a routine follow up visit.  Negative risk factors for bleeding include an age less than 60 years old.  The bleeding index is 'low risk'.  Negative CHADS2 values include Age > 100 years old.  The start date was 10/24/1999.  His last INR was 6.5 RATIO.  Anticoagulation responsible provider: Daleen Squibb MD, Maisie Fus.  INR POC: 2.0.  Cuvette Lot#: [id number].  Exp: 10/2010.    Anticoagulation Management Assessment/Plan:      The patient's current anticoagulation dose is Warfarin sodium 5 mg tabs: Take as directed.  The target INR is 2 - 3.  The next INR is due 11/08/2009.  Anticoagulation instructions were given to patient.  Results were reviewed/authorized by Leota Sauers, PharmD, BCPS, CPP.         Prior Anticoagulation Instructions: INR 2.2  Continue taking 1.5 tablets on Monday and Friday, and 1 tablet all other days.  Return to clinic in 4 weeks.    Current Anticoagulation Instructions: INR 2.0  Coumadin 1 and 1/2 tab on Mon and Fri 1 tab = 5mg  all other days

## 2010-07-16 NOTE — Medication Information (Signed)
Summary: rov/ewj  Anticoagulant Therapy  Managed by: Eda Keys, PharmD Referring MD: Olga Millers MD Supervising MD: Ladona Ridgel MD, Sharlot Gowda Indication 1: Pulmonary embolus (ICD-415.19) Lab Used: [facility] Site: [facility] INR POC 2.2 INR RANGE 2 - 3  Dietary changes: no    Health status changes: no    Bleeding/hemorrhagic complications: no    Recent/future hospitalizations: no    Any changes in medication regimen? no    Recent/future dental: no  Any missed doses?: no       Is patient compliant with meds? yes       Allergies: No Known Drug Allergies  Anticoagulation Management History:      The patient is taking warfarin and comes in today for a routine follow up visit.  Negative risk factors for bleeding include an age less than 37 years old.  The bleeding index is 'low risk'.  Negative CHADS2 values include Age > 37 years old.  The start date was 10/24/1999.  His last INR was 6.5 RATIO.  Anticoagulation responsible provider: Ladona Ridgel MD, Sharlot Gowda.  INR POC: 2.2.  Cuvette Lot#: [id number].  Exp: 10/2010.    Anticoagulation Management Assessment/Plan:      The patient's current anticoagulation dose is Warfarin sodium 5 mg tabs: Take as directed.  The target INR is 2 - 3.  The next INR is due 10/11/2009.  Anticoagulation instructions were given to patient.  Results were reviewed/authorized by Eda Keys, PharmD.  He was notified by Eda Keys.         Prior Anticoagulation Instructions: INR 2.5  Continue on same dosage 1 tablet daily except 1.5 tablets on Mondays and Fridays.  Recheck in 4 weeks.    Current Anticoagulation Instructions: INR 2.2  Continue taking 1.5 tablets on Monday and Friday, and 1 tablet all other days.  Return to clinic in 4 weeks.

## 2010-07-16 NOTE — Medication Information (Signed)
Summary: rov/mw  Anticoagulant Therapy  Managed by: Cloyde Reams, RN, BSN Referring MD: Olga Millers MD Supervising MD: Tenny Craw MD, Gunnar Fusi Indication 1: Pulmonary embolus (ICD-415.19) Lab Used: [facility] Site: [facility] INR POC 2.1 INR RANGE 2 - 3  Dietary changes: no    Health status changes: no    Bleeding/hemorrhagic complications: no    Recent/future hospitalizations: no    Any changes in medication regimen? yes       Details: Finished abx course last week  Recent/future dental: no  Any missed doses?: no       Is patient compliant with meds? yes       Allergies: No Known Drug Allergies  Anticoagulation Management History:      The patient is taking warfarin and comes in today for a routine follow up visit.  Negative risk factors for bleeding include an age less than 2 years old.  The bleeding index is 'low risk'.  Negative CHADS2 values include Age > 38 years old.  The start date was 10/24/1999.  His last INR was 3.7.  Anticoagulation responsible provider: Tenny Craw MD, Gunnar Fusi.  INR POC: 2.1.  Cuvette Lot#: [id number].  Exp: 04/2011.    Anticoagulation Management Assessment/Plan:      The patient's current anticoagulation dose is Warfarin sodium 5 mg tabs: Take as directed.  The target INR is 2 - 3.  The next INR is due 03/22/2010.  Anticoagulation instructions were given to patient.  Results were reviewed/authorized by Cloyde Reams, RN, BSN.  He was notified by Harrel Carina, PharmD candidate.         Prior Anticoagulation Instructions: INR 3.7 Do not take your dose today. Do a 1/2 tablet on saturday. Keep sunday the same at 1 tablet. On monday go down to 1 tablet instead of the 1.5. Continue 1 tablet on tuesday, wednesday, and thursday, and 1.5 tablets on friday.  Current Anticoagulation Instructions: INR 2.1  Continue taking 1 tablet everyday except take 1 1/2 tablets on Mondays and Fridays. Re-check INR in 4 weeks.

## 2010-07-18 NOTE — Assessment & Plan Note (Signed)
Summary: ec6/ gd   Visit Type:  Follow-up   History of Present Illness: 65 yo male for preoperative evaluation prior to prostate surgery. 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 He is on chronic Coumadin. He denies dyspnea on exertion, orthopnea, PND, palpitations, syncope or chest pain. He occasionally has mild pedal edema. Because of his pending surgery we were asked to evaluate.  Current Medications (verified): 1)  Warfarin Sodium 5 Mg Tabs (Warfarin Sodium) .... Take As Directed 2)  Flomax 0.4 Mg Xr24h-Cap (Tamsulosin Hcl) .... Take 2 Tablets By Mouth Once Daily 3)  Daily-Vitamin  Tabs (Multiple Vitamin) .... Take One Daily  Allergies: No Known Drug Allergies  Past History:  Past Medical History: COLONIC POLYPS HIATAL HERNIA PSORIASIS BENIGN PROSTATIC HYPERTROPHY, WITH OBSTRUCTION GERD FACTOR V LEIDEN History of pulmonary emboli in 2001 OSA  Past Surgical History: History of histoplasmosis with removal of pulmonary nodules.  Hernia repair Colonoscopy and EGD in 2005.   Family History: Reviewed history from 06/03/2010 and no changes required.  His mother died of COPD.  His father had lung cancer,   and he is a smoker.  His brother had an MI at age 51.  There is no history of diabetes, PEs in the family, coronary artery disease, hypertension, or hypercoagulable blood state.      Social History: Reviewed history from 06/03/2010 and no changes required.  He is divorced.  He has two children; one lives here in   the area.  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  He denies any alcohol or drugs.  He was a former   smoker.     Alcohol Use - no  Review of Systems       no fevers or chills, productive cough, hemoptysis, dysphasia, odynophagia, melena, hematochezia, dysuria, hematuria, rash, seizure activity,  orthopnea, PND,  claudication. Remaining systems are negative.  Vital Signs:  Patient profile:   65 year old male Height:      72 inches Weight:      268 pounds BMI:     36.48 Pulse rate:   64 / minute Pulse rhythm:   regular BP sitting:   132 / 86  (right arm)  Vitals Entered By: Jacquelin Hawking, CMA (June 04, 2010 10:04 AM)  Physical Exam  General:  Well developed/well nourished in NAD Skin warm/dry Patient not depressed No peripheral clubbing Back-normal HEENT-normal/normal eyelids Neck supple/normal carotid upstroke bilaterally; no bruits; no JVD; no thyromegaly chest - CTA/ normal expansion CV - RRR/normal S1 and S2; no murmurs, rubs or gallops;  PMI nondisplaced Abdomen -NT/ND, no HSM, no mass, + bowel sounds, no bruit 2+ femoral pulses, no bruits Ext-trace to 1+ ankle edema, no chords, 2+ DP Neuro-grossly nonfocal     EKG  Procedure date:  06/04/2010  Findings:      Sinus rhythm with no significant ST changes.  Impression & Recommendations:  Problem # 1:  PREOPERATIVE EXAMINATION (ICD-V72.84) Patient will have her upcoming prostate surgery. He does not have significant risk factors including no diabetes, hypertension, hyperlipidemia, family history and he does not smoke. He has no cardiac symptoms. I do not think he requires other ischemia evaluation prior to his surgery. Given his history of factor V Leiden and 2 previous pulmonary emboli he will need to be bridged with Lovenox while off Coumadin. I  will relate this to Coumadin clinic.  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  Problem # 3:  COUMADIN THERAPY (ICD-V58.61) Monitored in the Coumadin clinic. Goal INR 2-3. Hemoglobin monitored by his primary care. Orders: EKG w/ Interpretation (93000)  Problem # 4:  BENIGN PROSTATIC HYPERTROPHY, WITH OBSTRUCTION (ICD-600.01)  Management per urology.  Problem # 5:  GERD  (ICD-530.81)  Patient Instructions: 1)  Your physician recommends that you schedule a follow-up appointment in: YEAR WITH DR CRENSHAW 2)  Your physician recommends that you continue on your current medications as directed. Please refer to the Current Medication list given to you today. 3)  NEEDS TO HOLD COUMADIN AND BE BRIDGED WITH LOVENOX  WHEN IS SHCEDULED TO HAVE SX.

## 2010-07-18 NOTE — Medication Information (Signed)
Summary: rov/sp  Anticoagulant Therapy  Managed by: Weston Brass, PharmD Referring MD: Olga Millers MD Supervising MD: Clifton James MD, Cristal Deer Indication 1: Pulmonary embolus (ICD-415.19) Lab Used: [facility] Site: [facility] INR POC 1.7 INR RANGE 2 - 3  Dietary changes: no    Health status changes: yes       Details: Has normal blood in urine after procedure  Bleeding/hemorrhagic complications: no    Recent/future hospitalizations: yes       Details: Had procedure to remove prostate on 01/13  Any changes in medication regimen? no    Recent/future dental: no  Any missed doses?: no       Is patient compliant with meds? yes       Allergies: No Known Drug Allergies  Anticoagulation Management History:      The patient is taking warfarin and comes in today for a routine follow up visit.  Negative risk factors for bleeding include an age less than 59 years old.  The bleeding index is 'low risk'.  Negative CHADS2 values include Age > 1 years old.  The start date was 10/24/1999.  His last INR was 3.7.  Anticoagulation responsible provider: Clifton James MD, Cristal Deer.  INR POC: 1.7.  Cuvette Lot#: [id number].  Exp: 03/2011.    Anticoagulation Management Assessment/Plan:      The patient's current anticoagulation dose is Warfarin sodium 5 mg tabs: Take as directed.  The target INR is 2 - 3.  The next INR is due 07/19/2010.  Anticoagulation instructions were given to patient.  Results were reviewed/authorized by Weston Brass, PharmD.  He was notified by Linward Headland, PharmD candidate.         Prior Anticoagulation Instructions: INR 2.7  Continue same dose of 1 tablet every day except 1 1/2 tablets on Monday and Friday.   1/8- Last dose of Coumadin 1/9- No Coumadin or Lovenox 1/10- Lovenox 120mg  injection every 12 hours 1/11- Lovenox 120mg  injection every 12 hours 1/12- Lovenox 120mg  injection in AM only 1/13- Procedure.  When okay with MD, restart Coumadin.  Take 2 tablets on the  first day and 1 1/2 tablets the 2nd day then resume same dose.  Recheck INR  1 week after restarting.   Current Anticoagulation Instructions: INR 1.7 (INR goal: 2-3)  Take 1 tablet everyday except 1 and 1/2 tablets on Mondays.  Recheck in 10 days.

## 2010-07-18 NOTE — Medication Information (Signed)
Summary: rov/sp  Anticoagulant Therapy  Managed by: Weston Brass, PharmD Referring MD: Olga Millers MD Supervising MD: Clifton James MD, Cristal Deer Indication 1: Pulmonary embolus (ICD-415.19) Lab Used: [facility] Site: [facility] INR POC 2.7 INR RANGE 2 - 3  Dietary changes: no    Health status changes: no    Bleeding/hemorrhagic complications: no    Recent/future hospitalizations: yes       Details: having prostate surgery on 1/13  Any changes in medication regimen? no    Recent/future dental: no  Any missed doses?: no       Is patient compliant with meds? yes       Allergies: No Known Drug Allergies  Anticoagulation Management History:      The patient is taking warfarin and comes in today for a routine follow up visit.  Negative risk factors for bleeding include an age less than 69 years old.  The bleeding index is 'low risk'.  Negative CHADS2 values include Age > 53 years old.  The start date was 10/24/1999.  His last INR was 3.7.  Anticoagulation responsible provider: Clifton James MD, Cristal Deer.  INR POC: 2.7.  Cuvette Lot#: [id number].  Exp: 03/2011.    Anticoagulation Management Assessment/Plan:      The patient's current anticoagulation dose is Warfarin sodium 5 mg tabs: Take as directed.  The target INR is 2 - 3.  The next INR is due 07/08/2010.  Anticoagulation instructions were given to patient.  Results were reviewed/authorized by Weston Brass, PharmD.  He was notified by Weston Brass PharmD.         Prior Anticoagulation Instructions: INR 3.0  Continue same dose of 1 tablet every day except 1 1/2 tablets on Monday and Friday.  Recheck INR in 4 weeks.   Current Anticoagulation Instructions: INR 2.7  Continue same dose of 1 tablet every day except 1 1/2 tablets on Monday and Friday.   1/8- Last dose of Coumadin 1/9- No Coumadin or Lovenox 1/10- Lovenox 120mg  injection every 12 hours 1/11- Lovenox 120mg  injection every 12 hours 1/12- Lovenox 120mg  injection in AM  only 1/13- Procedure.  When okay with MD, restart Coumadin.  Take 2 tablets on the first day and 1 1/2 tablets the 2nd day then resume same dose.  Recheck INR  1 week after restarting.  Prescriptions: ENOXAPARIN SODIUM 120 MG/0.8ML SOLN (ENOXAPARIN SODIUM) Inject 1 syringe subcutaneously two times a day as directed  #5 x 0   Entered by:   Weston Brass PharmD   Authorized by:   Ferman Hamming, MD, [facility]   Signed by:   Weston Brass PharmD on 06/14/2010   Method used:   Electronically to        [facility] (retail)       [address]       [place], [place]  [address]       Ph: [phone]       Fax: [phone]   RxID:   [id number]

## 2010-07-18 NOTE — Progress Notes (Signed)
Summary: come off cumadin 5 days prior   Phone Note From Other Clinic   Caller: [facility] [phone] Request: Talk with Nurse Summary of Call: PT NEED TO COME OFF COUMADIN 5 DAY PRIOR DUE TO SURGERY- ON 1/13.  TURP. Initial call taken by: Lorne Skeens,  June 11, 2010 1:38 PM  Follow-up for Phone Call        spoke with [facility], per dr Jens Som note the pt will need to be bridged with lovenox prior to procedure. pt has an appt with the coumadin clinic on friday 06-14-10. will make sure coumadin clinic is aware Deliah Goody, RN  June 11, 2010 3:31 PM

## 2010-07-19 ENCOUNTER — Encounter (INDEPENDENT_AMBULATORY_CARE_PROVIDER_SITE_OTHER): Payer: Medicare Other

## 2010-07-19 ENCOUNTER — Encounter: Payer: Self-pay | Admitting: Cardiology

## 2010-07-19 DIAGNOSIS — Z7901 Long term (current) use of anticoagulants: Secondary | ICD-10-CM

## 2010-07-19 LAB — CONVERTED CEMR LAB: POC INR: 2.3

## 2010-07-24 NOTE — Medication Information (Signed)
Summary: rov/ewj  Anticoagulant Therapy  Managed by: Weston Brass, PharmD Referring MD: Olga Millers MD Supervising MD: Jens Som MD, Arlys John Indication 1: Pulmonary embolus (ICD-415.19) Lab Used: [facility] Site: [facility] INR POC 2.3 INR RANGE 2 - 3  Dietary changes: yes       Details: Plans on starting a "Juice diet" tomorrow.  Advised patient that this was not his best weight loss option.  Health status changes: no    Bleeding/hemorrhagic complications: yes       Details: Bleeding from prostate surgery is resolving  Recent/future hospitalizations: no    Any changes in medication regimen? yes       Details: No longer taking flomax  Recent/future dental: no  Any missed doses?: no       Is patient compliant with meds? yes       Allergies: No Known Drug Allergies  Anticoagulation Management History:      The patient is taking warfarin and comes in today for a routine follow up visit.  Negative risk factors for bleeding include an age less than 36 years old.  The bleeding index is 'low risk'.  Negative CHADS2 values include Age > 68 years old.  The start date was 10/24/1999.  His last INR was 3.7.  Anticoagulation responsible provider: Jens Som MD, Arlys John.  INR POC: 2.3.  Cuvette Lot#: [id number].  Exp: 06/2011.    Anticoagulation Management Assessment/Plan:      The patient's current anticoagulation dose is Warfarin sodium 5 mg tabs: Take as directed.  The target INR is 2 - 3.  The next INR is due 08/02/2010.  Anticoagulation instructions were given to patient.  Results were reviewed/authorized by Weston Brass, PharmD.  He was notified by Margot Chimes PharmD Candidate.         Prior Anticoagulation Instructions: INR 1.7 (INR goal: 2-3)  Take 1 tablet everyday except 1 and 1/2 tablets on Mondays.  Recheck in 10 days.  Current Anticoagulation Instructions: INR 2.3   Continue current Coumadin regimen of 1 tablet daily except for 1 1/2 tablets on Mondays

## 2010-07-30 DIAGNOSIS — D682 Hereditary deficiency of other clotting factors: Secondary | ICD-10-CM

## 2010-07-30 DIAGNOSIS — Z7901 Long term (current) use of anticoagulants: Secondary | ICD-10-CM

## 2010-08-02 ENCOUNTER — Encounter (INDEPENDENT_AMBULATORY_CARE_PROVIDER_SITE_OTHER): Payer: Medicare Other

## 2010-08-02 ENCOUNTER — Encounter: Payer: Self-pay | Admitting: Cardiology

## 2010-08-02 DIAGNOSIS — I2699 Other pulmonary embolism without acute cor pulmonale: Secondary | ICD-10-CM

## 2010-08-02 DIAGNOSIS — Z7901 Long term (current) use of anticoagulants: Secondary | ICD-10-CM

## 2010-08-07 NOTE — Medication Information (Signed)
Summary: rov/tm  Anticoagulant Therapy  Managed by: Weston Brass, PharmD Referring MD: Olga Millers MD Supervising MD: Daleen Squibb MD, Maisie Fus Indication 1: Pulmonary embolus (ICD-415.19) Lab Used: [facility] Site: [facility] INR POC 3.9 INR RANGE 2 - 3  Dietary changes: yes       Details: Started juice diet the day after his last visit.  He plans to continue this diet until 2/29.  At that point he will begin resuming his normal diet.    Health status changes: no    Bleeding/hemorrhagic complications: yes       Details: Increased bleeding since last visit but patient claims it was not enough to alarm him.  Patient has had prostate gland surgery recently.   Recent/future hospitalizations: no    Any changes in medication regimen? no    Recent/future dental: no  Any missed doses?: no       Is patient compliant with meds? yes       Allergies: No Known Drug Allergies  Anticoagulation Management History:      The patient is taking warfarin and comes in today for a routine follow up visit.  Negative risk factors for bleeding include an age less than 61 years old.  The bleeding index is 'low risk'.  Negative CHADS2 values include Age > 23 years old.  The start date was 10/24/1999.  His last INR was 3.7.  Anticoagulation responsible provider: Daleen Squibb MD, Maisie Fus.  INR POC: 3.9.  Cuvette Lot#: [id number].  Exp: 06/2011.    Anticoagulation Management Assessment/Plan:      The patient's current anticoagulation dose is Warfarin sodium 5 mg tabs: Take as directed.  The target INR is 2 - 3.  The next INR is due 08/14/2010.  Anticoagulation instructions were given to patient.  Results were reviewed/authorized by Weston Brass, PharmD.  He was notified by Margot Chimes PharmD Candidate.         Prior Anticoagulation Instructions: INR 2.3   Continue current Coumadin regimen of 1 tablet daily except for 1 1/2 tablets on Mondays  Current Anticoagulation Instructions: INR 3.9  Skip todays dose.  Then  start your new schedule of 1 tablet everyday until you come back in to have your INR rechecked in 2 weeks.

## 2010-08-14 ENCOUNTER — Encounter: Payer: Self-pay | Admitting: Cardiovascular Disease

## 2010-08-14 ENCOUNTER — Encounter (INDEPENDENT_AMBULATORY_CARE_PROVIDER_SITE_OTHER): Payer: Medicare Other

## 2010-08-14 DIAGNOSIS — I2699 Other pulmonary embolism without acute cor pulmonale: Secondary | ICD-10-CM

## 2010-08-14 DIAGNOSIS — Z7901 Long term (current) use of anticoagulants: Secondary | ICD-10-CM

## 2010-08-14 LAB — CONVERTED CEMR LAB: POC INR: 2.8

## 2010-08-22 NOTE — Medication Information (Signed)
Summary: rov/sp  Anticoagulant Therapy  Managed by: Weston Brass, PharmD Referring MD: Olga Millers MD Supervising MD: Excell Seltzer MD, Casimiro Needle Indication 1: Pulmonary embolus (ICD-415.19) Lab Used: [facility] Site: [facility] INR POC 2.8 INR RANGE 2 - 3  Dietary changes: yes       Details: Started eating his normal diet on Saturday night.  Stopped his juice diet  Health status changes: no    Bleeding/hemorrhagic complications: yes       Details: still having bleeding from his prostate  Recent/future hospitalizations: no    Any changes in medication regimen? no    Recent/future dental: no  Any missed doses?: no       Is patient compliant with meds? yes      Comments: Patient has been on an all juice diet for 20 days and has restarted eating regular food since Saturday night. Patient was also advised to follow up with MD who did his prostate surgery because he is still having bleeding.  He does not think that he will because he does not see the need to at this time.  I reinforced that to have bleeding this far out from his surgery may not be normal but it did not seem to make an impression on the patient.    Allergies: No Known Drug Allergies  Anticoagulation Management History:      The patient is taking warfarin and comes in today for a routine follow up visit.  Positive risk factors for bleeding include an age of 2 years or older.  The bleeding index is 'intermediate risk'.  Negative CHADS2 values include Age > 14 years old.  The start date was 10/24/1999.  His last INR was 3.7.  Anticoagulation responsible provider: Excell Seltzer MD, Casimiro Needle.  INR POC: 2.8.  Cuvette Lot#: [id number].  Exp: 06/2011.    Anticoagulation Management Assessment/Plan:      The patient's current anticoagulation dose is Warfarin sodium 5 mg tabs: Take as directed.  The target INR is 2 - 3.  The next INR is due 08/27/2010.  Anticoagulation instructions were given to patient.  Results were reviewed/authorized by  Weston Brass, PharmD.  He was notified by Margot Chimes PharmD Candidate.         Prior Anticoagulation Instructions: INR 3.9  Skip todays dose.  Then start your new schedule of 1 tablet everyday until you come back in to have your INR rechecked in 2 weeks.    Current Anticoagulation Instructions: INR 2.8  Continue to take 1 tablet daily.  Recheck INR in 2 weeks.

## 2010-08-27 ENCOUNTER — Encounter (INDEPENDENT_AMBULATORY_CARE_PROVIDER_SITE_OTHER): Payer: Medicare Other

## 2010-08-27 ENCOUNTER — Encounter: Payer: Self-pay | Admitting: Cardiovascular Disease

## 2010-08-27 DIAGNOSIS — I2699 Other pulmonary embolism without acute cor pulmonale: Secondary | ICD-10-CM

## 2010-08-27 DIAGNOSIS — Z7901 Long term (current) use of anticoagulants: Secondary | ICD-10-CM

## 2010-08-27 LAB — CONVERTED CEMR LAB: POC INR: 1.8

## 2010-09-01 LAB — PROTIME-INR
INR: 2.14 — ABNORMAL HIGH (ref 0.00–1.49)
INR: 2.18 — ABNORMAL HIGH (ref 0.00–1.49)
INR: 2.67 — ABNORMAL HIGH (ref 0.00–1.49)
Prothrombin Time: 23.7 seconds — ABNORMAL HIGH (ref 11.6–15.2)
Prothrombin Time: 27.6 seconds — ABNORMAL HIGH (ref 11.6–15.2)
Prothrombin Time: 28.2 seconds — ABNORMAL HIGH (ref 11.6–15.2)

## 2010-09-01 LAB — URINALYSIS, ROUTINE W REFLEX MICROSCOPIC
Bilirubin Urine: NEGATIVE
Hgb urine dipstick: NEGATIVE
Nitrite: NEGATIVE
Specific Gravity, Urine: 1.017 (ref 1.005–1.030)
pH: 6 (ref 5.0–8.0)

## 2010-09-01 LAB — CBC
HCT: 38.1 % — ABNORMAL LOW (ref 39.0–52.0)
HCT: 41.5 % (ref 39.0–52.0)
Hemoglobin: 13.1 g/dL (ref 13.0–17.0)
Hemoglobin: 13.9 g/dL (ref 13.0–17.0)
Hemoglobin: 14.3 g/dL (ref 13.0–17.0)
MCHC: 34.6 g/dL (ref 30.0–36.0)
RBC: 3.9 MIL/uL — ABNORMAL LOW (ref 4.22–5.81)
RBC: 4.16 MIL/uL — ABNORMAL LOW (ref 4.22–5.81)
RBC: 4.32 MIL/uL (ref 4.22–5.81)
RDW: 14.1 % (ref 11.5–15.5)
WBC: 7.6 10*3/uL (ref 4.0–10.5)
WBC: 7.6 10*3/uL (ref 4.0–10.5)

## 2010-09-01 LAB — BASIC METABOLIC PANEL
CO2: 25 mEq/L (ref 19–32)
Calcium: 8.2 mg/dL — ABNORMAL LOW (ref 8.4–10.5)
GFR calc Af Amer: >60 mL/min (ref >60)
GFR calc Af Amer: >60 mL/min (ref >60)
GFR calc non Af Amer: >60 mL/min (ref >60)
Glucose, Bld: 165 mg/dL — ABNORMAL HIGH (ref 70–99)
Potassium: 4.3 mEq/L (ref 3.5–5.1)
Sodium: 136 mEq/L (ref 135–145)
Sodium: 137 mEq/L (ref 135–145)

## 2010-09-01 LAB — DIFFERENTIAL
Eosinophils Relative: 0 % (ref 0–5)
Lymphocytes Relative: 12 % (ref 12–46)
Lymphs Abs: 0.9 10*3/uL (ref 0.7–4.0)
Monocytes Absolute: 0.8 10*3/uL (ref 0.1–1.0)

## 2010-09-01 LAB — APTT: aPTT: 31 seconds (ref 24–37)

## 2010-09-01 LAB — COMPREHENSIVE METABOLIC PANEL
ALT: 81 U/L — ABNORMAL HIGH (ref 0–53)
Alkaline Phosphatase: 55 U/L (ref 39–117)
BUN: 11 mg/dL (ref 6–23)
CO2: 26 mEq/L (ref 19–32)
Chloride: 107 mEq/L (ref 96–112)
Glucose, Bld: 122 mg/dL — ABNORMAL HIGH (ref 70–99)
Potassium: 3.9 mEq/L (ref 3.5–5.1)
Sodium: 138 mEq/L (ref 135–145)
Total Bilirubin: 0.7 mg/dL (ref 0.3–1.2)
Total Protein: 6.3 g/dL (ref 6.0–8.3)

## 2010-09-03 NOTE — Medication Information (Signed)
Summary: rov/cb  Anticoagulant Therapy  Managed by: Cloyde Reams, RN, BSN Referring MD: Olga Millers MD Supervising MD: Clifton James MD, Cristal Deer Indication 1: Pulmonary embolus (ICD-415.19) Lab Used: [facility] Site: [facility] INR POC 1.8 INR RANGE 2 - 3  Dietary changes: yes       Details: Resumed juice diet on Sunday.    Health status changes: no    Bleeding/hemorrhagic complications: yes       Details: Bleeding resolved from prostate surgery.   Recent/future hospitalizations: no    Any changes in medication regimen? no    Recent/future dental: no  Any missed doses?: no       Is patient compliant with meds? yes      Comments: [place] been [place] of [place] went off juice diet, but has since resumed.   Allergies: No Known Drug Allergies  Anticoagulation Management History:      The patient is taking warfarin and comes in today for a routine follow up visit.  Positive risk factors for bleeding include an age of 19 years or older.  The bleeding index is 'intermediate risk'.  Negative CHADS2 values include Age > 68 years old.  The start date was 10/24/1999.  His last INR was 3.7.  Anticoagulation responsible provider: Clifton James MD, Cristal Deer.  INR POC: 1.8.  Cuvette Lot#: [id number].  Exp: 08/2011.    Anticoagulation Management Assessment/Plan:      The patient's current anticoagulation dose is Warfarin sodium 5 mg tabs: Take as directed.  The target INR is 2 - 3.  The next INR is due 09/12/2010.  Anticoagulation instructions were given to patient.  Results were reviewed/authorized by Cloyde Reams, RN, BSN.  He was notified by Cloyde Reams RN.         Prior Anticoagulation Instructions: INR 2.8  Continue to take 1 tablet daily.  Recheck INR in 2 weeks.    Current Anticoagulation Instructions: INR 1.8  Take 1.5 tablets today, then resume same dosage 1 tablet daily.  Recheck in 2 weeks.

## 2010-09-12 ENCOUNTER — Ambulatory Visit (INDEPENDENT_AMBULATORY_CARE_PROVIDER_SITE_OTHER): Payer: Medicare Other | Admitting: *Deleted

## 2010-09-12 DIAGNOSIS — Z7901 Long term (current) use of anticoagulants: Secondary | ICD-10-CM

## 2010-09-12 DIAGNOSIS — D682 Hereditary deficiency of other clotting factors: Secondary | ICD-10-CM

## 2010-09-12 LAB — POCT INR: INR: 2.3

## 2010-09-12 NOTE — Patient Instructions (Signed)
INR 2.3 Continue taking 1 tablet (5mg ) daily.  Recheck in 4 weeks.

## 2010-09-17 NOTE — Op Note (Signed)
  NAMEJAYCOB, MCCLENTON               ACCOUNT NO.:  [id number]  MEDICAL RECORD NO.:  [id number]          PATIENT TYPE:  AMB  LOCATION:  [facility]                         FACILITY:  [facility]  PHYSICIAN:  Tiannah Greenly C. Vernie Ammons, M.D.  DATE OF BIRTH:  03-19-1946  DATE OF PROCEDURE: DATE OF DISCHARGE:                              OPERATIVE REPORT   PREOPERATIVE DIAGNOSIS:  Benign prostatic hypertrophy with bladder outlet obstruction.  POSTOPERATIVE DIAGNOSIS:  Benign prostatic hypertrophy with bladder outlet obstruction.  PROCEDURE:  Transurethral resection of the prostate (gyrus).  SURGEON:  Samba Cumba C. Vernie Ammons, M.D.  ANESTHESIA:  General.  DRAINS:  A 24-French three-way Foley catheter.  BLOOD LOSS:  Minimal.  SPECIMENS:  None.  COMPLICATIONS:  None.  INDICATIONS:  The patient is a 65 year old male with a known history of bladder outlet obstruction secondary to BPH.  He has been managed with Flomax, which was effective, but this decreased his ejaculate volume, and he wanted to get off the Flomax.  We discussed transurethral resection, which he understands will also result in retrograde ejaculation.  He would like to get off the Flomax and has elected to proceed with surgery being fully informed.  DESCRIPTION OF OPERATION:  After informed consent, the patient was brought to the major OR, placed on the table, administered general anesthesia, and then moved to the dorsal lithotomy position.  His genitalia was then sterilely prepped and draped, and an official time- out was then performed.  The 28-French resectoscope sheath with Timberlake obturator was introduced in the urethra and passed down to the area the prostatic urethra where the obturator was removed, and I inserted the 12-degree lens with resectoscope element.  This allowed me to pass the scope through the prostatic urethra, which revealed bilateral lateral lobe hypertrophy.  There was minimal median lobe present.  There was a  fairly high bladder neck.  Upon entering the bladder, I noted the mucosa appeared to be normal.  Ureteral orifices were of normal configuration and position as well.  There no tumor, stones or inflammatory lesions seen within the bladder.  I used the gyrus button to vaporize the prostate beginning at the 6 o'clock position and vaporizing back to the level of veru.  I then proceeded to treat the left lobe back to the surgical capsule and then the right lobe in a similar fashion.  Apical tissue was then vaporized, and the entire procedure was maintained proximal to the veru at all times.  At the end of the procedure, the bladder was reinspected and noted to be intact, and I therefore removed the resectoscope and inserted the 24-French three-way Foley catheter.  The patient was awakened and taken to recovery room in stable and satisfactory condition.  He tolerated the procedure well, and there were no intraoperative complications.     Graylon Amory C. Vernie Ammons, M.D.     MCO/MEDQ  D:  06/28/2010  T:  06/28/2010  Job:  [id number]  Electronically Signed by Ihor Gully M.D. on 07/03/2010 08:54:11 PM

## 2010-10-10 ENCOUNTER — Ambulatory Visit (INDEPENDENT_AMBULATORY_CARE_PROVIDER_SITE_OTHER): Payer: Medicare Other | Admitting: *Deleted

## 2010-10-10 DIAGNOSIS — D682 Hereditary deficiency of other clotting factors: Secondary | ICD-10-CM

## 2010-10-10 DIAGNOSIS — Z7901 Long term (current) use of anticoagulants: Secondary | ICD-10-CM

## 2010-10-29 NOTE — H&P (Signed)
NAMESHAWAN, CORELLA               ACCOUNT NO.:  [id number]   MEDICAL RECORD NO.:  [id number]          PATIENT TYPE:  INP   LOCATION:  3729                         FACILITY:  [facility]   PHYSICIAN:  Nestor Ramp, MD        DATE OF BIRTH:  02/06/1946   DATE OF ADMISSION:  03/03/2007  DATE OF DISCHARGE:                              HISTORY & PHYSICAL   PRIMARY CARE PHYSICIAN:  [facility], Stan Head. Cleta Alberts, M.D.   CHIEF COMPLAINT:  Bilateral pulmonary embolus.   HISTORY OF PRESENT ILLNESS:  The patient is a 65 year old white male who  presented with a past medical history significant for factor V Leiden  deficiency, history of PE in 2001, who presented to [facility]  today with a 3-day history of cough, subjective fever, a 58-month history  of myalgias, right leg swelling x1 week, which was very minimal.  The  patient had a chest x-ray performed at [facility] and noted to have  tachycardia with some decreased oxygenation, and sent to [facility] for  a CT angiogram to rule out a PE.  Noted there, the patient was noted to  have bilateral PEs on CT angiogram, with the largest being in the right  main pulmonary artery, with areas of infarction in the right lower lobe,  with no evidence of right heart strain.  The patient was admitted to the  family practice teaching service to begin anticoagulation.  Per the  patient's report, he was seen 10 months ago by [facility].  Dr.  Shelva Majestic is his doctor there.  They decided together to lower his  Coumadin dosage at experiment to see if this would be manageable without  having to have a repeat pulmonary embolus.  The patient has not been  back to the Coumadin Clinic, however, in 3 months.  He has not had his  INR checked in 3 months, so it is thought that he is subtherapeutic on  his Coumadin at this time.   REVIEW OF SYSTEMS:  No chest pain, no shortness of breath.  No nausea,  vomiting, or diarrhea.  No abdominal pain.  Positive  myalgias.  Positive  dyspnea after the CT and with oxygen in place, and positive tachycardia.   PAST MEDICAL HISTORY:  1. History of PE in 2001.  2. Factor V Leiden deficiency.  3. History of histoplasmosis, status post removal of pulmonary      nodules.  4. Gastroesophageal reflux disease.  5. Benign prostatic hypertrophy.  6. Psoriasis.  7. History of colonic polyps.  8. History of hemorrhoids.  9. Hiatal hernia.   PAST SURGICAL HISTORY:  1. Right thoracoscopic surgery in 2004 for removal of a pulmonary      nodule.  2. Colonoscopy and EGD in 2005.   ALLERGIES:  No known drug allergies.   MEDICATIONS AT HOME:  1. Flomax 0.4 mg p.o. b.i.d.  2. Coumadin 2.5 mg p.o. daily.  3. Prilosec 20 mg daily.  4. Vitamin C.  5. Vitamin B12.  6. Garlic salt tablets.  7. Apple cider vinegar tablets.  SOCIAL HISTORY:  He is divorced.  He has two children; one lives here in  the area.  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  He denies any alcohol or drugs.  He was a former  smoker.   FAMILY HISTORY:  His mother died of COPD.  His father had lung cancer,  and he is a smoker.  His brother had an MI.  There is no history of  diabetes, PEs in the family, coronary artery disease, hypertension, or  hypercoagulable blood state.   PHYSICAL EXAMINATION:  VITAL SIGNS:  Were pending at the time of  admission.  GENERAL:  He is in no acute distress, pleasant appearing, sitting up in  bed talking.  HEENT: Normocephalic, atraumatic.  Moist mucous membranes.  External  ocular muscles intact.  CARDIOVASCULAR:  He is mildly tachycardia.  Normal S1, S2.  No murmurs,  rubs or gallops.  LUNGS:  Clear to auscultation bilaterally.  No wheezes, no deficits.  ABDOMEN:  Soft, nontender, nondistended.  Positive bowel sounds.  EXTREMITIES:  He has mild peripheral changes consistent with  psoriasis.  Negative Homans' sign.  No edema.  NEUROLOGIC:  He is afocal.   LABORATORY DATA:  Creatinine was 0.93 prior to CT angiogram, BUN was 8.  CT angiogram showed bilateral PEs, largest in the main right pulmonary  artery, areas of infarction in the right lower lobe.  No evidence of  right heart strain, and small right pleural effusion.   ASSESSMENT AND PLAN:  This is a 65 year old with diagnosis of bilateral  pulmonary emboli, thought to be secondary to factor V Leiden deficiency  as well as being subtherapeutic on Coumadin; however, the INR is not  available at this time.  1. For the bilateral PEs, we will go ahead and bring this patient in      for 23-hour observation.  We will perform lower extremity Dopplers.      We will begin this patient on treatment with Lovenox at 1mg /kg q.      12 h., and then send him home with home health to have Lovenox      injections to be bridging him with his Coumadin for 5 days once he      is therapeutic.  He will begin Coumadin with a 10 mg dose today and      then continue 7.5 mg, to be followed up in the Coumadin Clinic at      [facility] later this week.  Most likely, this patient will      be here just for 23-hour observation depending on what his lower      extremity Dopplers show, and at that time then we will send him      over to [facility] for further followup of his Coumadin and      back to [facility] for followup of his other medical      conditions.  2. For his gastroesophageal reflux disease, we will continue him on      Prilosec.  3. For his BPH, we will continue him on his Flomax.  4. For prophylaxis, this patient has a factor V Leiden deficiency.  He      is not currently an IVC filter candidate due to the fact that he      does not have any cardiovascular compromise and no significant risk  for bleeding.   DISPOSITION:  Is pending the results of his lower extremity Dopplers.      Wilhemina Bonito, M.D.  Electronically Signed      Nestor Ramp, MD  Electronically Signed    ML/MEDQ  D:  03/03/2007  T:  03/04/2007  Job:  [id number]   cc:   Brett Canales A. Cleta Alberts, M.D.

## 2010-10-29 NOTE — Discharge Summary (Signed)
Stanley Wright, Stanley Wright               ACCOUNT NO.:  [id number]   MEDICAL RECORD NO.:  [id number]          PATIENT TYPE:  INP   LOCATION:  3729                         FACILITY:  [facility]   PHYSICIAN:  Nestor Ramp, MD        DATE OF BIRTH:  [birth year]   DATE OF ADMISSION:  03/03/2007  DATE OF DISCHARGE:  03/04/2007                               DISCHARGE SUMMARY   DISCHARGE DIAGNOSES:  1. Bilateral pulmonary embolism.  2. Factor V Leiden deficiency.   SECONDARY DIAGNOSES AT DISCHARGE:  1. Gastroesophageal reflux disease.  2. Benign prostatic hypertrophy.   CONSULTS:  [facility].   STUDIES:  March 03, 2007, CT angiogram of the chest revealed  bilateral pulmonary emboli with areas of infarction in the right lower  lobe.  No evidence of right heart strain.  Small right pleural  effusions.  Lower extremity Dopplers on March 04, 2007, revealed a  DVT at the right distal femoral vein, popliteal vein and proximal calf  vein, also with small scattered DVT throughout the calf veins.  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   DISCHARGE LABS:  INR at discharge was 1.2.  CBC revealed white blood  cell count 7.2, hemoglobin 13.3, platelet count 213.  BMP revealed  sodium 137, potassium 3.6, chloride 105, bicarb 28, glucose 100, BUN 9,  creatinine 0.92.   BRIEF HOSPITAL H&P:  Stanley Wright is a 65 year old white male who  presented with a past medical history significant for factor V Leiden  deficiency and history of PE in 2001, to [facility] with a 3-day  history of cough, subjective fevers and a 82-month history of myalgia and  right leg swelling.  He was noted at [facility] to have tachycardia and  decreased oxygenation, and thus was sent to [facility] for a CT  angiogram to rule out PE.   HOSPITAL COURSE:  1. Bilateral pulmonary emboli.  Patient was found to, in fact, have      bilateral pulmonary emboli on CT angiogram, with the largest  being      in the right main pulmonary artery and with areas of infarction in      the right lower lobe but no evidence of right heart strain.      Patient was admitted to begin anticoagulation.  Patient was started      on Lovenox 1 mg/kg b.i.d. subcutaneously as well as Coumadin 10 mg      for 2 days and then 7.5 mg thereafter.  Patient was subtherapeutic      with regard to his Coumadin, that he had been decreasing on his own      as an outpatient, and came in with an INR of 1.1.  At discharge, it      was up to just 1.2 but patient had just been started on his      anticoagulation.  Patient was taught how to self administer Lovenox      injections at home.  Patient was agreeable to doing these as a  bridge with his Coumadin until followup with [facility].      [facility] was consulted, as they will be following his INR      with regard to his Lovenox and Coumadin.  2. Gastroesophageal reflux disease.  Patient was continued on his      proton pump inhibitor.  3. Benign prostatic hypertrophy.  Patient was continued on his      outpatient Flomax.   DISCHARGE MEDICATIONS:  1. Flomax 0.4 mg 2 tabs p.o. daily.  2. Lovenox 1 mg/kg every 12 hours subcutaneously until followup.  3. Coumadin 10 mg on Thursday, March 04, 2007, and Friday,      March 05, 2007, followed by 7.5 mg on September 20 and 21,      2008.  Dose will be re assessed on Monday.  4. Prilosec 20 mg p.o. daily.  5. Vitamin C p.o. daily.   INSTRUCTIONS:  Diet:  No restrictions.  Activity:  No restrictions.  Followup:  Patient is to follow up his PT and INR at the lab at Dr.  Ludwig Clarks office on Monday, March 08, 2007, at 2:33 p.m., phone  number [phone].  He is also to follow up with Dr. Perrin Maltese at [facility], phone number [phone] on March 15, 2007, at  10:45 a.m.      Ancil Boozer, MD  Electronically Signed      Nestor Ramp, MD  Electronically Signed     SA/MEDQ  D:  03/04/2007  T:  03/05/2007  Job:  [id number]   cc:   [facility]  [facility]

## 2010-11-01 NOTE — Consult Note (Signed)
[facility]  Patient:    NED, KAKAR Visit Number: [id number] MRN: [id number]          Service Type: MED Location: [id number] Attending Physician:  Doneta Public Dictated by:   Lorette Ang, N.P. Proc. Date: 10/04/01 Admit Date:  10/03/2001   CC:         Dr. Vanita Ingles L. Deirdre Priest, M.D.  Dr. Ninetta Lights   Consultation Report  DATE OF BIRTH:  [birth year]  REASON FOR CONSULTATION:  Thrombocytopenia.  REFERRING PHYSICIAN:  Marshall L. Deirdre Priest, M.D.  HISTORY OF PRESENT ILLNESS:  The patient is an 65 year old man with a history of a pulmonary embolus 2001, factor V Leiden positive, BPH and GERD who was admitted on September 15, 2001 with thrombocytopenia. He is on chronic Coumadin. He reports that he was in [place] from August 23, 2001 to September 09, 2001 on a mission trip. In preparation for this trip he was started on mefloquine on August 16, 2001 on a weekly basis for four doses. He discontinued the medication on September 06, 2001 secondary to pedal edema. He reports that he had taken mefloquine on two other occasions (May of 2001 and January of 2003) with no problems. Approximately four days after returning home from [place], he developed a persistent headache and began taking Tylenol around the clock. On September 17, 2001, he developed fever with occasional chills and sweats as well as body aches. On September 14, 2001, he began Biaxin for three days with no improvement. This was followed by Cipro for three days with no change in his symptoms. On September 19, 2001, he presented to urgent care at [facility] with lab work at that time showing a hemoglobin 14.1, white blood cell count 5, platelet count 151,000. Sodium 140, potassium 3.9, creatinine 1.1, calcium 9.1, total protein 7.3, albumin 4, total bilirubin 0.4, alkaline phosphatase 160, SGOT 69, SGPT 91, GGT 209, hepatitis B negative, hepatitis C negative, and a negative  malarial smear. Followup labs on September 24, 2001 showed a hemoglobin 13.5, white blood cell count 4.3, platelets 234,000, PT 44.9/INR 4.2, alkaline phosphatase 203, SGOT 61, SGPT 87, hepatitis A negative, C and B negative, EBV negative, and a second negative malarial smear. On October 02, 2001, he noticed "blood blisters" in his mouth as well as gum bleeding, rectal bleeding with wiping after a bowel movement, blood with nose blowing and rash over his lower extremities. He presented to the urgent medical center again on October 03, 2001 with lab work at that time showing a hemoglobin of 17, white blood cell count 6.4, and a platelet count of 59,000. Hematology consult has been requested to further evaluate this patient with thrombocytopenia.  PAST MEDICAL HISTORY: 1. Pulmonary embolus 2001. 2. Factor V Leiden positive -- heterozygote. 3. BPH. 4. GERD/hiatal hernia. 5. Cirrhosis.  CURRENT MEDICATIONS:  None.  HOME MEDICATIONS: 1. Flomax. 2. Coumadin. 3. Prilosec. 4. Tylenol.  ALLERGIES:  No known drug allergies.  FAMILY HISTORY:  Mother deceased age 55 with COPD; father deceased age 65 with lung cancer; brother with history of GERD, brother who is healthy; and sister who has a history of breast cancer.  SOCIAL HISTORY:  The patient lives in [place]. He is divorced. He has two healthy children ages 38 and 23. He is a retired Emergency planning/management officer. He denies any EtOH use.  REVIEW OF SYSTEMS:  The patient denies any weight loss or anorexia. His energy has been poor. He has  had an intermittent low-grade fever for the past three weeks usually occurring 1-2 times per day. He reports occasional chills and sweats with the fevers. He had been experiencing a headache but this has resolved. He denies any vision changes. He has noticed some bleeding after blowing his nose. He has also noticed some gum bleeding. He had "blood blisters" in his mouth prior to admission. He does report some dyspnea  on exertion. He has had no cough. He denies any chest pain or peripheral edema. He has had no recent change in his bowel habits. He has noticed some blood with wiping after a bowel movement. He denies any gross hematuria. He does report his urine has been "dark."  PHYSICAL EXAMINATION:  VITAL SIGNS:  Temperature 98, heart rate 68, respirations 24, blood pressure 116/58. Oxygen saturation 95% on room air.  GENERAL:  Well-nourished very pleasant Caucasian male in no acute distress.  HEENT:  Normocephalic, atraumatic. Pupils equal, round, and reactive to light; extraocular movements are intact; sclerae anicteric; conjunctival hemorrhage left eye. A few resolving petechia over buccal mucosa; no active bleeding.  LYMPHATICS:  No palpable cervical, supraclavicular, axillary, or inguinal lymph nodes.  CHEST:  Lungs are clear bilaterally; no wheezes or rales.  CARDIOVASCULAR:  Regular rate and rhythm.  ABDOMEN:  Soft and nontender. No hepatosplenomegaly.  EXTREMITIES:  No edema. Petechia over lower extremities.  NEUROLOGICAL:  Alert and oriented x3. Follows commands. Motor strength is 5/5.  LABORATORY DATA:  Hemoglobin 13.4, white count 5.7, platelets 15,000. PT 17.8, INR 1.7, PTT 38, fibrinogen 468, D-dimer 0.23. Sodium 138, potassium 4.1, BUN 16, creatinine 1.1, glucose 110, total bilirubin 0.6, alkaline phosphatase 147, SGOT 43, SGPT 58, total protein 7.2, albumin 3.5, calcium 8.8.  Peripheral blood smear: 1. RBCs: Rare teardrop; a few targets; no increased polychromasia; no    schistocytes. 2. White blood cells: Normal. 3. Platelets: Markedly decreased; rare large platelet.  IMPRESSION: 1. Severe thrombocytopenia. 2. Febrile illness. 3. Recent travel out of the country. 4. History of pulmonary embolus 2001.  5. Factor V Leiden positive, heterozygote. 6. Elevated liver enzymes.  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 This appears to be associated  with a current febrile illness (question viral). The differential diagnosis also includes thrombocytopenia secondary to a drug effect. We believe this and a primary hematologic diagnosis such as a malignancy are less likely.  RECOMMENDATIONS: 1. Prednisone. 2. IVIG. Dr. Truett Perna discussed the risk/potential toxicity with the patient    and he agrees to proceed. 3. Followup CBC daily. 4. ID workup per Dr. Ninetta Lights. 5. Hold Coumadin.  We will continue to follow with you.  The patient seen and examined by Dr. Truett Perna; labs and peripheral blood smear reviewed. Dictated by:   Lorette Ang, N.P. Attending Physician:  Doneta Public DD:  10/04/01 TD:  10/04/01 Job: [id number]

## 2010-11-01 NOTE — Discharge Summary (Signed)
Stanley Wright. [facility]  Patient:    Stanley Wright, Stanley Wright Visit Number: [id number] MRN: [id number]          Service Type: MED Location: [phone] Attending Physician:  Doneta Public Dictated by:   Juanell Fairly, M.D. Admit Date:  10/03/2001 Discharge Date: 10/07/2001   CC:         Dr. Andee Poles; [facility] [place] Dr.  Jillyn Hidden B. Truett Perna, M.D.; Hematology  Dr. Ninetta Lights; Infectious diseases   Discharge Summary  DATE OF BIRTH:  [birth year]  ATTENDING:  Gaynell Face L. Deirdre Priest, M.D.  INTERN:  Dr. Remonia Richter.  DISCHARGE DIAGNOSES: 1. Thrombocytopenia. 2. Fevers. 3. Factor V Leiden deficiency.  DISCHARGE MEDICATIONS: 1. Prednisone 40 mg p.o. b.i.d. with meals. 2. Doxycycline 100 mg p.o. b.i.d. 3. Protonix 40 mg p.o. q.d.  FOLLOW-UP APPOINTMENTS: 1. See Dr. Ninetta Lights with infectious diseases May 1 at 4 p.m. 2. He was instructed that Dr. Danielle Dess office will call him for an    appointment on Monday. 3. He was instructed to [facility] on [facility]    or Tuesday.  HOSPITAL COURSE:  Mr. Giuliani is a 65 year old white male who recently returned from [place].  Shortly after returning, he began feeling ill. Approximately four days after returning, he had headaches for several days and then began having low grade fevers with generalized chills and some flu-like aches.  After 5-6 days of this, he went to [facility] for [facility].  At this visit, his LFTs were elevated and a malaria smear was done which was negative.  The patient was sent home with plan for getting an abdominal ultrasound.  Ultrasound was never done and the patient continued to have symptoms.  He returned to [facility] on April 11, which again showed mildly increased LFTs with negative serology for EBV, CMV, hepatitis, and malaria.  CBCs on both visits were normal.  The patients symptoms persisted and one day prior to  presentation at [facility], he began having what he described as blisters and welts in his mouth.  He also has noted small bruises on his arms and face and bright red blood per rectum, and blood from his nose.  His urine also appeared darker to him.  He returned to [facility] and was found to have a decrease in platelet count from 234 on April 11, to 59 on April 19.  Of note, the patient has Factor V Leiden deficiency and had a pulmonary embolus in the year 2000 after which he was placed on Coumadin and his INR at [facility] was 1.9.  Please see the admission H&P for further history.  PHYSICAL EXAMINATION:  VITAL SIGNS:  On admission, the patients temperature was 101.4, pulse 108, blood pressure 134/92, respirations 18, weight 227.6 pounds.  His oxygen saturation was 94% on room air.  HEENT:  He had dried blood in his nasal passages bilaterally and his oral cavity had multiple 0.5-1 cm ecchymosis on the tongue and buccal mucosa, as well as scattered petechiae.  The patient had no gross bleeding.  LUNGS:  Clear to auscultation.  CARDIOVASCULAR:  Regular rate and rhythm with no murmurs, rubs, or gallops.  EXTREMITIES:  The skin on his extremities showed multiple scattered petechiae on both upper and lower extremities, as well as his back.  RECTAL:  Dried blood around the anus that was hem positive.  LABORATORY DATA: 1. On admission, from [facility].  On April 6, his LDH was    296, AST 69, ALP 91.  GGT 209, and alkaline phosphatase 160.  His    platelet count was 151, WBC 5000, hemoglobin 14.1. 2. Hepatitis virus panel and malaria smear were negative. 3. On September 24, 2001, alkaline phosphatase was 203, AST 61, ALT 87,    platelets 234, WBC 4.3, hemoglobin 13.5, total bilirubin 0.4. 4. Again, a hepatitis panel was negative, EBV was negative, CMV was    negative, and malaria smear was negative. 5. On April 20, at [facility],  his platelet count was 59,000,    WBC 6.4, hemoglobin 17, ESR 62, INR 1.9. 6. Fever virus titer was pending at that time.  PROBLEM LIST: #1 - THROMBOCYTOPENIA:  The patients platelet count eventually dropped to 6. The patient was subsequently given a total of 10 units of platelets. Hematology and infectious diseases were both consulted and the patient was placed on prednisone and doxycycline, as well as IVIG.  The patients platelet count rose and on April 23, his platelet count was 63, at which time the IVIG was discontinued.  At the time of discharge, the patients platelet count was 94.  The patient will be discharged, therefore, on prednisone and doxycycline. He will be taking 80 mg of prednisone a day.  He is to see Dr. Truett Perna on Monday and he will be tapered off his prednisone by Dr. Danielle Dess instruction.  #2 - FEVERS:  The patient had a low grade fever during hospitalization but never after the initial presentation did he have a temperature greater than 100.5.  Multiple titers were drawn including [facility] Spotted Fever which was negative, urine culture which was negative, blood cultures which were negative to date on the day of admission, which is a total of four days so far, HIV which was negative, serum IgG which was 1160, serum IgM which was 75 to SRM.  His malaria smear showed no Plasmodium.  Pending tests include influenza A and B, adenovirus antibodies, Coccidia A, Coccidia B, three miscellaneous tests.  The patient is to see Dr. Ninetta Lights to follow up on these on May 1 at 4 p.m.  #3 - FACTOR V LEIDEN DEFICIENCY:  The patient is to remain off Coumadin until his platelet count gets greater than 100.  Hopefully, this will occur by Monday when he sees Dr. Truett Perna. Dictated by:   Juanell Fairly, M.D. Attending Physician:  Doneta Public DD:  10/07/01 TD:  10/07/01  Job: [id number]

## 2010-11-01 NOTE — H&P (Signed)
[place]  Patient:    Stanley Wright, Stanley Wright Visit Number: [id number] MRN: [id number]          Service Type: MED Location: [phone] Attending Physician:  Doneta Public Dictated by:   Michell Heinrich, M.D. Admit Date:  10/03/2001 Discharge Date: 10/07/2001                           History and Physical  SERVICE:  [facility].  ATTENDING:  Estill Batten. Deirdre Priest, M.D.  RESIDENT:  Michell Heinrich, M.D.  CHIEF COMPLAINT:  Fever and low platelets.  HISTORY OF PRESENT ILLNESS:  Mr. Fils is a 65 year old white man who recently took a trip to [place] and shortly after returning began feeling ill.  Approximately four days after returning he began having headaches for several days in a row and then began having low-grade fever with generalized chills and some flu-like aches.  After five to six days of this [facility] (September 19, 2001).  At this visit LFTs were elevated and malaria smear was done and other labs were unremarkable, and the patient was sent home with the plan for getting an abdominal ultrasound.  The ultrasound was never done and the patient continued to have the previously-stated symptoms and he represented to [facility] on September 24, 2001.  Labs at that time showed continued mildly elevated LFTs with negative serology for EBV, CMV, hepatitis viruses, and a malaria smear was again negative.  Again, CBC was normal.  The patients symptoms persisted and the night before presentation here he had onset of blisters and "welts" in his mouth.  He also noted small bruises on his arms and face, bright red blood per rectum, and blood from blowing or wiping his nose.  His urine also appeared darker to him.  He then went back [facility] and labs there showed that his platelet count had dropped from 234,000 on April 11 to  59,000.  Also of note, the patient has been on Coumadin since having a pulmonary embolus in 2000 but INR today [facility] was 1.9.  PAST MEDICAL HISTORY: 1. Pulmonary embolism in 2000; Coumadin therapy since. 2. Factor V Leiden. 3. BPH. 4. GERD. 5. Psoriasis.  PAST SURGICAL HISTORY:  Left inguinal herniorrhaphy x2 in 1998 and 1999.  MEDICATIONS: 1. Coumadin 5 mg alternating with 7.5 mg. 2. Flomax 4 mg daily. 3. Prilosec daily. 4. Tylenol 500 mg q.3-4h. for the last three weeks. 5. Mefloquine.  (He took this several days in the end of March after his    return from [place] but this was discontinued secondary to pedal edema.)  ALLERGIES:  Questionable SULFA allergy - unknown reaction.  HABITS AND SOCIAL HISTORY:  The patient is divorced for the last 10 years and lives in [place].  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  He does yearly to twice yearly mission trips to third world countries.  He quit smoking 30 years ago and denies any alcohol or drug use.  No exercise regimen and says, "I eat too much."  REVIEW OF SYSTEMS:  No weight loss, no pain, no photophobia.  His appetite is normal and he is without cough or shortness of breath.  No melena, no clouding of sensorium.  He does note worsening of his psoriasis recently.  PHYSICAL EXAMINATION:  VITAL SIGNS:  Temperature  101.4, pulse 108, blood pressure 134/92, respirations 18, weight 227.6 pounds, O2 saturation 94% on room air.  GENERAL:  He is alert and oriented x4, no apparent distress, pleasant, with appropriate conversation.  HEENT:  Pupils equal, round, and reactive to light and accommodation. Extraocular movements intact.  Nasal passages with dried blood bilaterally. Oral cavity with multiple 0.5 to 1 cm ecchymoses on the tongue and buccal mucosa and many scattered petechia.  No gross bleeding noted.  NECK:  Supple with nontender submandibular lymphadenopathy.  No thyromegaly, no JVD.  LUNGS:   Clear to auscultation bilaterally.  Nonlabored respirations.  CARDIOVASCULAR:  Showed regular rhythm and rate.  No murmur, rub, or gallop.  ABDOMEN:  Soft, nontender, nondistended.  Bowel sounds normoactive.  No hepatosplenomegaly, no masses.  EXTREMITIES:  No clubbing, cyanosis, or edema.  SKIN:  Multiple scattered petechiae on the upper and lower extremities, abdomen, and trunk.  RECTAL:  Showed dried red blood around the anus, heme positive.  LABORATORY DATA:  All of the following labs are from [facility]. The date for the following labs is September 19, 2001:  Alkaline phosphatase 160, LDH 296, AST 69, ALT 91, GGT 209.  Platelets 151,000; white blood cell count 5000; hemoglobin 14.1.  Hepatitis virus panel and malaria smear negative.  From [facility] on September 24, 2001:  Alkaline phosphatase 203, AST 61, ALT 87.  Platelets 234,000; white blood cell count 4300; hemoglobin 13.5.  T bili 0.4.  Again, hepatitis viral panel, EBV, CMV, and malaria smear negative.  From October 03, 2001 at [facility]:  Platelets 59,000; white blood cell count 6400; hemoglobin 17.  ESR 62.  INR 1.9.  Dengue fever virus titer done and pending at [facility].  IMPRESSION:  A 65 year old white male with recent trip to [place] who now has prolonged febrile illness, elevated transaminases, and thrombocytopenia.  1. Thrombocytopenia.  The differential diagnosis includes idiopathic    thrombocytopenic purpura, thrombotic thrombocytopenic purpura, hemolytic    uremic syndrome, [facility] spotted fever, dengue fever, malaria, or    other viral infections.  We will evaluate further with complete metabolic    panel.  Will check RMSF IgM and IgG, repeat malaria smear, and will avoid    any NSAIDS and hold Coumadin for now. 2. Elevated liver function tests.  Differential includes viral infection,    Tylenol toxicity, cholelithiasis/cholecystitis, alcohol ingestion, and     other rare causes.  Will need imaging of liver with either ultrasound or CT     scan.  Will follow LFTs and hold Tylenol for now. 3. Fever.  Differential is broad, including malaria, viral infection,    bacterial infection (although no clear focus of infection), and fungal    infection.  Will check blood cultures, malaria smear with fevers.  Hold    antibiotics until we have something to treat or the patient appears more    acutely ill. 4. Factor V Leiden.  This is a hypercoagulable state but we have to hold    Coumadin in the face of present bleeding potential. 5. Benign prostatic hypertrophy.  Will continue Flomax. 6. Gastroesophageal reflux disease.  Will continue PPI. Dictated by:   Michell Heinrich, M.D. Attending Physician:  Doneta Public DD:  10/04/01 TD:  10/04/01 Job: [id number]

## 2010-11-01 NOTE — H&P (Signed)
NAME:  Stanley Wright, Stanley Wright                         ACCOUNT NO.:  [id number]   MEDICAL RECORD NO.:  [id number]                   PATIENT TYPE:  INP   LOCATION:  NA                                   FACILITY:  [facility]   PHYSICIAN:  Ines Bloomer, M.D.              DATE OF BIRTH:  Jan 28, 1946   DATE OF ADMISSION:  01/29/2003  DATE OF DISCHARGE:                                HISTORY & PHYSICAL   REFERRING PHYSICIANS:  Dr. Mancel Bale and Dr. Sandrea Hughs.   CHIEF COMPLAINT:  Right middle lobe lesion, rule out carcinoma.   BRIEF HISTORY:  The patient is a 65 year old white male with a history of  pulmonary embolus in May 2000 and finding of a Leiden factor mutation in  2001 also.  He presented with left-sided chest discomfort, and a nodule was  found in 1996.  A chest x-ray obtained on December 30, 2002, found a new right  middle lobe lesion.  The patient presented with a dry cough which would not  go away.  A CT scan showed a 2 x 2 cm right middle lobe nodule, a 2.5 mm  nodule left lower lobe with tiny calcifications appearing benign, and a 3 mm  nodule of the right upper lobe.  There was mediastinal or hilar adenopathy  present.  At this point he is referred to [facility] and Dr. Edwyna Shell.  It was his  opinion that the patient should undergo right video-assisted thoracoscopy  and wedge resection of his nodule.  He is admitted for the same at this  time.   PAST MEDICAL HISTORY:  1. Leiden factor V deficiency, on chronic Coumadin.  2. History of pulmonary embolus.  3. History of idiopathic thrombocytopenic purpura, treated with steroids,     thought to be viral in origin.  4. BPH.  5. Gastroesophageal reflux disease.   SURGERY:  Inguinal hernia repair with a redo in 1999.   MEDICATIONS ON ADMISSION:  1. Coumadin 7.5 mg daily.  This was discontinued on January 25, 2003.  2. Flomax 0.4 mg p.o. q.a.m. daily.  3. Prilosec 20 mg p.o. daily q.a.m.   ALLERGIES:  SULFA DRUGS were the source of a  reported allergy, but he says  that he took something when he was in [place] in the mountains to prevent  altitude sickness, and it was sulfa-based, with no problems.   REVIEW OF SYSTEMS:  No problems with his thyroid, swallowing.  His weight  has been stable, around 222 pounds, since age 70.  No history of diabetes,  kidney problems, asthma, COPD.  No history of TIAs or CVA.  No history of  coronary artery disease.  No angina, arrhythmias, or myocardial infarction.  He did have pulmonary embolus.  No history of DVT, GI bleeding, dysuria,  hematuria.  He does have a history of gastroesophageal reflux disease.  No  history of congestive heart failure, hypertension, shortness  of breath,  dyspnea on exertion, PND, or orthopnea.   FAMILY HISTORY:  Father died with lung cancer, age 2.  His mom had COPD and  died at age 46.  One sister with breast cancer who is still living at age  60.   SOCIAL HISTORY:  He is divorced.  He has two children in good health.  He is  a retired Retail banker.  He does not use alcohol.  He used  cigarettes for approximately 8-10 years, two packs a day, quit 25 years ago.   PHYSICAL EXAMINATION:  GENERAL:  Well-nourished, well-developed white male.  Obese.  In no acute distress.  Alert and oriented.  VITAL SIGNS:  Blood pressure is 120/80 in the right arm, 124/90 in the left  arm, pulse was 84 and regular, respirations were 16 and unlabored.  HEENT:  Head:  Normocephalic.  Eyes:  PERRLA.  EOMs intact.  Fundi normal.  Ears, nose, throat, mouth:  Grossly within normal limits.  CHEST:  Clear to auscultation and percussion.  Respirations are not labored.  CARDIAC:  Regular rate and rhythm.  No murmurs, rubs, or gallops.  ABDOMEN:  Soft and nontender.  Positive bowel sounds.  No  hepatosplenomegaly.  No masses.  No bruits.  GENITOURINARY/RECTAL:  Deferred.  EXTREMITIES:  No clubbing, cyanosis, or edema.  He does have psoriasis, more  in his right leg  than his left leg.  Pulses are +2 and equal throughout  their distribution.  NEUROLOGIC:  within normal limits.   IMPRESSION:  1. Right middle lobe lesion, rule out cancer.  2. Leiden factor V deficiency, on chronic Coumadin.  3. History of pulmonary embolus.  4. History of idiopathic thrombocytopenic purpura.  5. Gastroesophageal reflux disease.  6. Benign prostatic hypertrophy.   PLAN:  The patient is scheduled for right video-assisted thoracoscopy, wedge  resection.  He will be admitted overnight and be placed on heparin prior to  his surgery.  His Coumadin has been discontinued on January 25, 2003.      Eber Hong, P.A.                 Ines Bloomer, M.D.    WDJ/MEDQ  D:  01/27/2003  T:  01/27/2003  Job:  [id number]

## 2010-11-01 NOTE — Discharge Summary (Signed)
[facility]  Patient:    Stanley Wright, Stanley Wright                      MRN: [id number] Adm. Date:  [id number] Disc. Date: 10/28/99 Attending:  Avie Echevaria Dictator:   Earley Favor, RN, MSN, ACNP CC:         Dr. Earl Lites                           Discharge Summary  DATE OF BIRTH:  14-May-1946  DISCHARGE DIAGNOSES:  Pulmonary emboli.  PROCEDURE PERFORMED:  None.  Mr. Mchan is a 65 year old white male patient of Dr. Cleta Alberts of [facility] who Mr. Minerd uses as his primary care physician.  He presented initially with upper respiratory symptoms approximately six weeks ago and was treated for  upper respiratory infection with appropriate antibiotics.  He did not improve. He continued to have shortness of breath.  Returned to see Dr. Cleta Alberts, who ordered a CT scan which was positive for pulmonary embolism.  As he has seen Dr. Sherene Sires in the  past for benign lung nodule, he was admitted to the [facility] under Dr. Rolin Barry care on Oct 22, 1999.  LABORATORY DATA:  Sodium 130, potassium 3.9, chloride 105, CO2 27, BUN 16, creatinine 0.9, glucose 90.  Hemoglobin 13.5, hematocrit 39.8, white blood cell  count 5000, platelet count 219,000.  INR 3.2 on Oct 28, 1999.  PTT was 86. Factor V ____________ is positive.  Protein C is 115.  Protein S is 170. Anticardiolipid enzymes were pending.  ALT is 23, ALT is 55.  Total bilirubin 0.7.  CK is 212, CK-MB 2.7, troponon I is less than 0.03.  CEA is less than 0.5.  12-lead EKG shows sinus rhythm.  CT of the chest shows extensive thrombi distal to the left pulmonary artery extending to the right intralobar artery.  With filling defects noted in  segmental branches of left lower lobe as well.  Also noted 5 mm nodule at left ase which has been followed in the past.  HOSPITAL COURSE: 1 - Pulmonary emboli.  The patient was admitted to [facility] with a  diagnosis of right pulmonary embolism.  Coagulopathic studies were initiated prior to anticoagulation with heparin.  Noted to have a factor V ____________ antiobody.  He was changed from heparin to Coumadin by Oct 28, 1998.  He had been on Coumadin for four days with an INR of 3.2 by date of discharge on Oct 28, 1999. He remained hemodynamically stable throughout this admission.  He will be followed up on an outpatient basis.  He also is scheduled at the Coumadin clinic on Oct 20, 1999 to have his Coumadin regulated by Dr. Jimmey Ralph.  MEDICATIONS:  1. Coumadin 5 mg 1 q.d.  2. Flomax 0.4 mg 1 q.d.  He has got a follow-up with Dr. Sherene Sires on Nov 08, 1999 at 3:45 p.m.  Diet is as tolerated.  SPECIAL INSTRUCTIONS:  Call for any problems.  DISPOSITION/CONDITION ON DISCHARGE:  Cardiovascular hemodynamically stable.  No  further complaint of shortness of breath.   Currently therapeutic on his INR with treatment with Coumadin. He will be followed up on an outpatient basis. DD:  10/28/99 TD:  10/28/99 Job: [id number]

## 2010-11-01 NOTE — H&P (Signed)
[place]  Patient:    Stanley Wright, Stanley Wright Visit Number: [id number] MRN: [id number]          Service Type: MED Location: [id number] Attending Physician:  Doneta Public Dictated by:   Michell Heinrich, M.D. Admit Date:  10/03/2001 Discharge Date: 10/07/2001                           History and Physical  SERVICE:  [facility].  CHIEF COMPLAINT:  Fever and lot platelets.  HISTORY OF PRESENT ILLNESS:  Mr. Stanley Wright is a 65 year old white man who recently took a trip to [place] and shortly after returning, began feeling ill.  Approximately four days after returning, he began having headaches for several days in a row and then began having low-grade fever with generalized chills and some fells aches.  After five to six days of this, he went to [facility] for evaluation (September 19, 2001).  At this visit, LFTs were elevated and malaria smear was done and other labs were unremarkable and the patient was sent home with the plan for getting an abdominal ultrasound.  The ultrasound was never done, the patient continued to have the previously stated symptoms and he re-presented to [facility] on September 24, 2001.  Labs at that time showed continued mildly elevated LFTs with negative serology for EBV, CMV, hepatitis viruses and a malaria smear was again negative.  Again, CBC was normal.  The patients symptoms persisted and the night before presentation here, he had onset of blisters and "whelps" in his mouth; he also noted small bruises on his arms and face, bright red blood per rectum, and blood from blowing or wiping his nose.  His urine also appeared darker to him.  He then went back to [facility] and labs there showed that his platelet count had dropped from 234,000 on September 24, 2001 to 59,000.  Also of note, the patient has been on Coumadin since having a pulmonary  embolus in 2000 but INR today at [facility] was 1.9.  PAST MEDICAL HISTORY: 1. Pulmonary embolism in 2000, on Coumadin therapy since. 2. Factor V leiden. 3. BPH. 4. GERD. 5. Psoriasis.  PAST SURGICAL HISTORY:  Left inguinal herniorrhaphy x2, in 1998 and 1999.  MEDICATIONS: 1. Coumadin 5 mg alternating with 7.5 mg. 2. Flomax 4 mg daily. 3. Prilosec daily. 4. Tylenol 500 mg every three to four hours for the last three weeks. 5. Mefloquine (he took this several days in the end of March after his return    from [place], but this was discontinued secondary to pedal edema).  ALLERGIES:  Questionable SULFA allergy, unknown reaction.  HABITS AND SOCIAL HISTORY:  The patient is divorced for the last 10 years and lives in [place].  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  He does yearly or twice-yearly mission trips to third world countries.  He quit smoking 30 years ago and denies any alcohol or drug use.  No exercise regimen and says, "I eat too much."  REVIEW OF SYSTEMS:  No weight loss.  No pain.  No photophobia.  His appetite is normal and he is without cough or shortness of breath.  No melena.  No clouding of sensorium.  He does note worsening of his psoriasis recently.  PHYSICAL EXAMINATION:  VITAL SIGNS:  Temperature 101.4, pulse 108, blood pressure 134/92, respirations 18, weight  227.6 pounds and O2 saturation 94% on room air.  GENERAL:  He is alert and oriented x4, in no apparent distress, pleasant with appropriate conversation.  HEENT:  Pupils equal, round and reactive to light and accommodation. Extraocular movements intact.  Nasal passages with dried blood bilaterally, oral cavity with multiple 0.5- to 1.0-cm ecchymoses on the tongue and buccal mucosa and many scattered petechiae.  No gross bleeding noted.  NECK:  Supple with nontender submandibular lymphadenopathy.  No thyromegaly. No JVD.  LUNGS:  Clear to auscultation bilaterally, nonlabored  respirations.  CARDIOVASCULAR:  Regular rhythm and rate.  No murmur, rub or gallop.  ABDOMEN:  Soft, nontender, nondistended.  Bowel sounds normoactive.  No hepatosplenomegaly.  No masses.  EXTREMITIES:  No clubbing, cyanosis or edema.  SKIN:  Multiple scattered petechiae on the upper and lower extremities, abdomen, and trunk.  RECTAL:  Dried red blood around the anus, heme-positive.  LABORATORY AND ACCESSORY DATA:  All the following labs are from [facility].  The dates for the following labs are September 19, 2001 and that is alkaline phosphatase 160, LDH 296, AST 69, ALT 91, GGT 209; platelets 151,000, white blood cell count 5000, hemoglobin 14.1; hepatitis viral panel and malaria smear negative.  Now from [facility] on September 24, 2001, the following labs:  Alkaline phosphatase 203, AST 61, ALT 87; platelets 234,000, white blood cell count 4300 and hemoglobin 13.5, with a total bilirubin of 0.4.  Again, hepatitis viral panel, EBV, CMV, and malaria smear negative.  Next labs from October 03, 2001 at [facility]: Platelets of 59,000, white blood cell count 6400, hemoglobin 17; ESR 62 and INR 1.9.  Dengue fever virus titer done and pending at [facility].  IMPRESSION:  A 65 year old white man with recent trip to [place] who now has prolonged febrile illness, elevated transaminases and thrombocytopenia.  1. Thrombocytopenia.  The differential diagnosis includes idiopathic    thrombocytopenia, thrombotic thrombocytopenic purpura, hemolytic uremic    syndrome, [facility] spotted fever, dengue fever, malaria, or other    viral infections.  We will evaluate further with complete metabolic panel.    We will check [facility] spotted fever IgM and IgG, repeat malaria    smear, and will avoid any nonsteroidal anti-inflammatory drugs and hold    Coumadin for now. 2. Elevated liver function tests.  Differential includes viral infection,     Tylenol toxicity, cholelithiasis/cholecystitis, alcohol ingestion, and     other rare causes.  Will need imaging of liver with either ultrasound or CT    scan.  Will follow liver function tests and hold Tylenol for now. 3. Fever.  Differential is broad including malaria, viral infection, bacterial    infection (although no clear focus of infection), and fungal infection.  We    will check blood cultures, malaria smear with fevers, hold antibiotics    until we have something to treat or the patient appears more acutely ill. 4. Factor V leiden.  This is a hypercoagulable state but we have to hold    Coumadin in the face of present bleeding potential. 5. Benign prostatic hypertrophy.  We will continue Flomax. 6. Gastroesophageal reflux disease.  We will continue proton pump inhibitor. Dictated by:   Michell Heinrich, M.D. Attending Physician:  Doneta Public DD:  10/04/01 TD:  10/04/01 Job: [id number]

## 2010-11-01 NOTE — Op Note (Signed)
NAME:  Stanley Wright, Stanley Wright                         ACCOUNT NO.:  [id number]   MEDICAL RECORD NO.:  [id number]                   PATIENT TYPE:  AMB   LOCATION:  ENDO                                 FACILITY[facility]  [facility]   PHYSICIAN:  Petra Kuba, M.D.                 DATE OF BIRTH:  1945-09-14   DATE OF PROCEDURE:  10/18/2003  DATE OF DISCHARGE:                                 OPERATIVE REPORT   PROCEDURE:  EGD with biopsy.   INDICATIONS:  A patient with abnormal gastric folds, want to recheck.  Consent was signed prior to having premedications given and after risks,  benefits, methods and options were thoroughly discussed in the office in the  past.   MEDICATIONS USED:  (Additionally)  Demerol 20 mg only.   DESCRIPTION OF PROCEDURE:  The scope was inserted by direct vision.  He did  have a small hiatal hernia.  The esophagus was normal, no sight of  esophagitis.  The scope was then passed into the stomach; advanced through a  normal antrum, normal pylorus to a normal duodenal bulb, around the celiac  to a normal second portion of the duodenum.  The ampulla was seen, which was  slightly colorless, but no other abnormality.  The scope was withdrawn back  to the bulb and a good look there ruled out abnormalities in that location.  The scope was then withdrawn back to the stomach, which was evaluated on  straight and retroflex visualization.  The abnormal folds along the greater  curve were confirmed.  Photo documentation was obtained.  High in the cardia  the hiatal hernia was confirmed.  The annularis and fundus, and remainder of  the lesser and greater curve were normal.  A few biopsies of these abnormal  folds were obtained.  The scope was then slowly withdrawn.  After air was  suctioned again,  a good look at the esophagus was normal.  The scope  removed.  The patient tolerated the procedure well and there were no obvious  immediate complications.   ENDOSCOPIC DIAGNOSES:  1. Small  hiatal hernia.  2. Greater curve abnormal folds, unchanged.  Status post biopsy.  3. Otherwise normal esophagogastroduodenoscopy.   PLAN:  Await pathology.  Continue Prilosec.  Follow up with me p.r.n.  Otherwise, return care to Dr. Cleta Alberts for the customary health care  maintenance, to include yearly rectals and guaiacs.                                               Petra Kuba, M.D.    MEM/MEDQ  D:  10/18/2003  T:  10/18/2003  Job:  [id number]   cc:   Brett Canales A. Cleta Alberts, M.D.  [address]  [place]  [place] [address]  Fax: [phone]

## 2010-11-01 NOTE — Discharge Summary (Signed)
Stanley Wright, Stanley Wright                         ACCOUNT NO.:  [id number]   MEDICAL RECORD NO.:  [id number]                   PATIENT TYPE:  INP   LOCATION:  2041                                 FACILITY:  [facility]   PHYSICIAN:  Ines Bloomer, M.D.              DATE OF BIRTH:  04/03/1946   DATE OF ADMISSION:  01/29/2003  DATE OF DISCHARGE:  02/04/2003                                 DISCHARGE SUMMARY   REFERRING PHYSICIANS:  1. Dr. Nyoka Cowden.  2. Dr. Jillyn Hidden B. Sherrill.   ADMISSION DIAGNOSES:  1. Right middle lobe lesion.  2. Leiden [factor V] on chronic Coumadin.  3. History of pulmonary embolus.  4. History of thrombocytopenia.  5. Benign prostatic hypertrophy.  6. Gastroesophageal reflux disease.   DISCHARGE DIAGNOSES:  1. 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  2. Postoperative atrial fibrillation.  3. Leiden Factor V on chronic Coumadin.  4. History of thrombocytopenia.  5. Benign prostatic hypertrophy.  6. Gastroesophageal reflux disease.  7. 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   PROCEDURE:  Right video assisted thoracoscopy, mini-thoracotomy wedge  resection of the right middle lobe January 29, 2003.   BRIEF HISTORY:  The patient is a 65 year old white male with a history of  pulmonary embolus in 2001.  Found to have a Leiden Factor mutation at that  time.  He developed left sided chest discomfort, a nodule was found in 1996.  Chest x-ray, obtained December 30, 2002 secondary to a chronic dry cough, showed  a new right middle lobe nodule.  A CT scan showed a 2 x 2-cm right middle  lobe nodule and a 2.5-mm nodule in the left lower lobe with tiny  calcifications, appearing benign and a 3-mm nodule in the right upper lobe.  No mediastinal or hilar adenopathy was found.  He continues to have a cough.  He was referred to [facility] and Dr. Edwyna Shell and it was his opinion he  should  undergo video assisted thoracoscopy and wedge resection of his nodule.  He  was admitted for the same at this time.   PAST MEDICAL HISTORY:  As above.   SURGERIES:  He has had an inguinal hernia repair in 1998 and re-done with  mesh in 1999.   MEDICATIONS:  1. Coumadin 7.5 mg every day.  2. Flomax 0.4 mg every day.  3. Prilosec 20 mg every day.   ALLERGIES:  SULFA drugs was reported to him as a child.   Further history and physical please see the dictated note.   HOSPITAL COURSE:  The patient was admitted, taken to the operating room at  which time he underwent video assisted thoracoscopy, mini-thoracotomy and  wedge resection of the nodule from the right middle lobe.  He tolerated the  procedure well.  Initial pathology showed a granuloma.  Final pathology  came  back showing yeast with either histoplasmosis or cryptococcus.  Everything  was cultured and the final pathology is pending.  He tolerated the procedure  well and returned to the recovery room 3300 in satisfactory condition.   His postoperative course was fairly benign except for some postoperative  atrial fibrillation which was treated with digoxin and amiodarone.  He was  seen in consultation by Dr. Rockey Situ. Flavia Shipper. of the infectious  disease service.  Initially it was recommended that he be started on  itraconazole.  Shortly after that, he developed rapid atrial fibrillation,  medications had to be adjusted for that.  It was subsequently determined to  hold off on any treatment for the histoplasmosis until his atrial  fibrillation was controlled and Dr. Roxan Hockey would determine whether to  initiate treatment after he returned for followup as an outpatient.  His  atrial fibrillation converted.  His chest tubes were removed and he has made  good progress without any other significant problems.   By February 03, 2003, his wounds were healing nicely.  He was mobilizing well  and it was Dr. Scheryl Darter opinion  he would be ready for discharge in the a.m.  on February 04, 2003.   He is scheduled to go home on:  1. Amiodarone 200 mg every day.  2. Digoxin 0.125 mg every day.  3. Prilosec 20 mg every day.  4. Flomax 0.4 mg every day.  5. Coumadin 7.5 mg every day.  6. Tylox 1-2 p.o. q.4h. p.r.n.   He will followup with Dr. Edwyna Shell on Friday, February 10, 2003 at 9 a.m. with a  chest x-ray at [facility] at 8 a.m. He will return to the [facility]  on February 06, 2003 or February 07, 2003 for a pro time and he is followed by  Shelby Dubin in the [facility].   DISCHARGE LABS:  As of February 01, 2003 hemoglobin is 14.1 with hematocrit of  40, white count of 8.0 and a platelet count of 132,000.  B-MET shows a  sodium of 136, potassium of 4, chloride of 102, CO2 of 27 and BUN of 11,  creatinine 1 and a glucose of 151.  Pro time on February 03, 2003 is 14.8 with  an INR of 1.2.  Patient has been restarted and has been receiving Coumadin  7.5 mg every day since his first postoperative day January 30, 2003.   CONDITION ON DISCHARGE:  Improving.        Eber Hong, P.A.                 Ines Bloomer, M.D.    WDJ/MEDQ  D:  02/03/2003  T:  02/05/2003  Job:  [id number]   cc:   Leighton Roach. Truett Perna, M.D.  [address] [facility]  [place]  [place]  [address]  Fax: [phone]   Charlaine Dalton. Sherene Sires, M.D. [facility]   Rockey Situ. Flavia Shipper., M.D.  [address]  [place]  [place] [address]  Fax: [phone]

## 2010-11-01 NOTE — Op Note (Signed)
NAME:  Stanley Wright, Stanley Wright                         ACCOUNT NO.:  [id number]   MEDICAL RECORD NO.:  [id number]                   PATIENT TYPE:  AMB   LOCATION:  ENDO                                 FACILITY:  [facility]   PHYSICIAN:  Petra Kuba, M.D.                 DATE OF BIRTH:  1945-09-03   DATE OF PROCEDURE:  10/18/2003  DATE OF DISCHARGE:                                 OPERATIVE REPORT   PROCEDURE:  Colonoscopy with hot biopsy.   INDICATION:  History of colon polyps, due for repeat screening.  Consent was  signed after risks, benefits, methods, options thoroughly discussed in the  office on multiple occasions.   MEDICINES USED:  1. Demerol 50.  2. Versed 10.  3. Phenergan 12.5.   DESCRIPTION OF PROCEDURE:  Rectal inspection was pertinent for external  hemorrhoids, small.  Digital exam was negative.  The video colonoscope was  inserted, easily advanced around the colon to the cecum.  This did require  rolling him on his back and some abdominal pressure.  On insertion, some  left-sided diverticula were seen only.  Cecum was identified by the  appendiceal orifice and the ileocecal valve.  Scope was slowly withdrawn.  The prep was adequate.  There was some liquid stool that required washing  and suctioning.  On slow withdrawal through the colon, a distal ascending  tiny polyp was seen and was hot biopsied x 1, also a distal transverse tiny  to small polyp was seen and hot biopsied x 1.  Both were put in the same  container.  Scope was slowly withdrawn.  The left-sided diverticula were  confirmed, but no other polyps, tumors, masses, or other abnormalities as we  slowly withdrew back to the rectum.  Anorectal pull-through and retroflexion  confirmed some small hemorrhoids.  The scope was reinserted a short ways up  the left side of the colon; air was suctioned and scope removed.  The  patient tolerated the procedure well.  There was no obvious immediate  complication.   ENDOSCOPIC  DIAGNOSES:  1. Internal-external small hemorrhoids.  2. Left-sided diverticula.  3. Two tiny to small polyps, one in the distal ascending, one in the distal     transverse, both hot biopsied.  4. Otherwise, within normal limits to the cecum.   PLAN:  1. Await pathology to determine future colonic screening.  2. Start half dose of Coumadin tomorrow if no problem and in one week go to     full dose.  3. Continue work-up with a recheck EGD of his normal gastric folds.                                               Petra Kuba, M.D.    MEM/MEDQ  D:  10/18/2003  T:  10/18/2003  Job:  [id number]   cc:   Brett Canales A. Cleta Alberts, M.D.  [address]  [place]  [place] [address]  Fax: [phone]

## 2010-11-01 NOTE — Op Note (Signed)
NAME:  Stanley Wright, Stanley Wright                         ACCOUNT NO.:  [id number]   MEDICAL RECORD NO.:  [id number]                   PATIENT TYPE:  INP   LOCATION:  2550                                 FACILITY:  [facility]   PHYSICIAN:  Ines Bloomer, M.D.              DATE OF BIRTH:  Dec 17, 1945   DATE OF PROCEDURE:  DATE OF DISCHARGE:                                 OPERATIVE REPORT   PREOPERATIVE DIAGNOSIS:  Right middle lobe lesion.   POSTOPERATIVE DIAGNOSIS:  Right granulomatous process, right middle lobe.   OPERATION PERFORMED:  Right video assisted thoracoscopic surgery  minithoracotomy wedge,  right middle lobe lesion.   SURGEON:  Ines Bloomer, M.D.   ASSISTANT:  Eber Hong, P.A.   ANESTHESIA:  General.   DESCRIPTION OF PROCEDURE:  After percutaneous insertion of all monitoring  lines, the patient underwent general anesthesia, was turned to the right  lateral thoracotomy position.  A dual lumen tube was inserted.  He was  prepped and draped in the usual sterile manner.  Two trocar sites were made  in the anterior and posterior axillary line in the seventh intercostal  space.  Two trocars were inserted.  A 30 degree scope was inserted and the  lesion could be seen in the medial segment of the right middle lobe.  It was  approximately 2 cm in size.  In order to resect it, a 3 to 4 cm incision was  made over the fifth intercostal space between the anterior axillary line and  toward the midaxillary line.  The latissimus was partially divided.  The  serratus was split.  A small Tuffier was inserted.  The lesion was grasped  with a Duval lung clamp and resected with the EZ-45 stapler with several  applications.  It was sent for frozen section.  While it was in for frozen  section, the fissure was dissected out and partially opened with one  application of the EZ-45 stapler.  Frozen section came back benign.  It was  a granulomatous process.  No air  leak was seen.  Two  chest tubes were placed into the trocar sites and tied  in place with 0 silk.  The chest was closed with two pericostals, #1 Vicryl  in the muscle layer, 2-0 Vicryl in the subcutaneous tissue, 3-0 Vicryl  subcuticular stitch and Dermabond for the skin.  The patient was then  transferred to the recovery room in stable condition.                                               Ines Bloomer, M.D.    DPB/MEDQ  D:  01/30/2003  T:  01/30/2003  Job:  [id number]   cc:   Casimiro Needle B. Sherene Sires, M.D. [facility]   Jillyn Hidden  Doyce Loose, M.D.  [address] [name]  [place]  [place]  [address]  Fax: [phone]

## 2010-11-01 NOTE — Procedures (Signed)
[facility]  Patient:    Stanley Wright, Stanley Wright                      MRN: [id number] Proc. Date: 10/01/00 Adm. Date:  [id number] Attending:  Nelda Marseille CC:         Lesle Chris, M.D.  Charlaine Dalton. Sherene Sires, M.D. [facility]   Procedure Report  PROCEDURE:  Esophagogastroduodenoscopy with biopsy.  ENDOSCOPIST:  Petra Kuba, M.D.  INDICATION:  Abnormal gastric folds with metaplasia on previous biopsies. Consent was signed after risks, benefits, methods, and options were thoroughly discussed in the past.  MEDICATIONS USED:  Demerol 60, Versed 7.5.  PROCEDURE:  Video endoscope was inserted by direct vision.  The esophagus was normal.  There was a moderate size hiatal hernia seen and the scope was inserted into the stomach, advanced through a normal antrum, normal pylorus, into a normal duodenal bulb, and around the celiac to a normal second portion of the duodenum.  Scope was withdrawn back to the bulb and a good look there ruled out ulcer _____ location.   Scope was withdrawn back to the stomach and along the mid greater curve, the abnormal folds were seen and appeared to be unchanged.  Photo documentation and multiple biopsies were obtained.  The proximal part of the stomach was normal.  He had some difficulty holding air.  The scope was retroflexed which did not reveal any obvious other abnormality.  After the biopsies were done, the scope was slowly withdrawn.  Again, a good look at the esophagus was normal without signs of Barretts or significant esophagitis.  The scope was removed.  The patient tolerated the procedure well.  There was no obvious immediate complication.  ENDOSCOPIC DIAGNOSES: 1. Moderate hiatal hernia. 2. Abnormal greater curve, abnormal folds, essentially unchanged, status post    biopsy. 3. Otherwise normal esophagogastroduodenoscopy.  PLAN:  Will await pathology to determine future endoscopic surveillance.  I would be happy to see  back p.r.n.  Okay to start Coumadin tomorrow and return care to Dr. Sherene Sires and Dr. Cleta Alberts. DD:  10/01/00 TD:  10/02/00 Job: [id number]

## 2010-11-08 ENCOUNTER — Ambulatory Visit (INDEPENDENT_AMBULATORY_CARE_PROVIDER_SITE_OTHER): Payer: Medicare Other | Admitting: *Deleted

## 2010-11-08 DIAGNOSIS — D682 Hereditary deficiency of other clotting factors: Secondary | ICD-10-CM

## 2010-11-08 DIAGNOSIS — Z7901 Long term (current) use of anticoagulants: Secondary | ICD-10-CM

## 2010-12-06 ENCOUNTER — Encounter: Payer: Medicare Other | Admitting: *Deleted

## 2010-12-13 ENCOUNTER — Ambulatory Visit (INDEPENDENT_AMBULATORY_CARE_PROVIDER_SITE_OTHER): Payer: Medicare Other | Admitting: *Deleted

## 2010-12-13 DIAGNOSIS — D682 Hereditary deficiency of other clotting factors: Secondary | ICD-10-CM

## 2010-12-13 DIAGNOSIS — Z7901 Long term (current) use of anticoagulants: Secondary | ICD-10-CM

## 2010-12-27 ENCOUNTER — Other Ambulatory Visit: Payer: Self-pay | Admitting: Pharmacist

## 2010-12-27 MED ORDER — WARFARIN SODIUM 5 MG PO TABS: ORAL_TABLET | ORAL | Status: DC

## 2011-01-10 ENCOUNTER — Ambulatory Visit (INDEPENDENT_AMBULATORY_CARE_PROVIDER_SITE_OTHER): Payer: Medicare Other | Admitting: *Deleted

## 2011-01-10 DIAGNOSIS — D682 Hereditary deficiency of other clotting factors: Secondary | ICD-10-CM

## 2011-01-10 DIAGNOSIS — Z7901 Long term (current) use of anticoagulants: Secondary | ICD-10-CM

## 2011-01-10 LAB — POCT INR: INR: 1.5

## 2011-01-31 ENCOUNTER — Ambulatory Visit (INDEPENDENT_AMBULATORY_CARE_PROVIDER_SITE_OTHER): Payer: Medicare Other | Admitting: *Deleted

## 2011-01-31 DIAGNOSIS — Z7901 Long term (current) use of anticoagulants: Secondary | ICD-10-CM

## 2011-01-31 DIAGNOSIS — D682 Hereditary deficiency of other clotting factors: Secondary | ICD-10-CM
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
## 2011-02-21 ENCOUNTER — Ambulatory Visit (INDEPENDENT_AMBULATORY_CARE_PROVIDER_SITE_OTHER): Payer: Medicare Other | Admitting: *Deleted

## 2011-02-21 DIAGNOSIS — Z7901 Long term (current) use of anticoagulants: Secondary | ICD-10-CM

## 2011-02-21 DIAGNOSIS — D682 Hereditary deficiency of other clotting factors: Secondary | ICD-10-CM

## 2011-02-21 LAB — POCT INR: INR: 2.1

## 2011-03-21 ENCOUNTER — Ambulatory Visit (INDEPENDENT_AMBULATORY_CARE_PROVIDER_SITE_OTHER): Payer: Medicare Other | Admitting: *Deleted

## 2011-03-21 DIAGNOSIS — Z7901 Long term (current) use of anticoagulants: Secondary | ICD-10-CM

## 2011-03-21 DIAGNOSIS — D682 Hereditary deficiency of other clotting factors: Secondary | ICD-10-CM

## 2011-03-21 LAB — POCT INR: INR: 1.9

## 2011-03-27 LAB — BASIC METABOLIC PANEL
CO2: 28
Chloride: 105
Creatinine, Ser: 0.92
GFR calc Af Amer: >60
Potassium: 3.6
Sodium: 137

## 2011-03-27 LAB — COMPREHENSIVE METABOLIC PANEL
Albumin: 3.4 — ABNORMAL LOW
Alkaline Phosphatase: 91
BUN: 8
Calcium: 9.1
Potassium: 4.2
Total Protein: 7.4

## 2011-03-27 LAB — CBC
HCT: 38.5 — ABNORMAL LOW
HCT: 41.3
Hemoglobin: 13.3
MCHC: 34
MCHC: 34.5
MCV: 93.1
Platelets: 224
RBC: 4.14 — ABNORMAL LOW
RDW: 13.2
WBC: 7.2

## 2011-03-27 LAB — BUN: BUN: 8

## 2011-03-27 LAB — PROTIME-INR
INR: 1.1
Prothrombin Time: 14

## 2011-03-27 LAB — CREATININE, SERUM
Creatinine, Ser: 0.93
GFR calc non Af Amer: >60

## 2011-04-18 ENCOUNTER — Ambulatory Visit (INDEPENDENT_AMBULATORY_CARE_PROVIDER_SITE_OTHER): Payer: Medicare Other | Admitting: *Deleted

## 2011-04-18 DIAGNOSIS — Z7901 Long term (current) use of anticoagulants: Secondary | ICD-10-CM

## 2011-04-18 DIAGNOSIS — D682 Hereditary deficiency of other clotting factors: Secondary | ICD-10-CM

## 2011-05-05 ENCOUNTER — Other Ambulatory Visit: Payer: Self-pay | Admitting: Cardiology

## 2011-05-05 MED ORDER — WARFARIN SODIUM 5 MG PO TABS: 5.0000 mg | ORAL_TABLET | ORAL | Status: DC

## 2011-05-16 ENCOUNTER — Ambulatory Visit (INDEPENDENT_AMBULATORY_CARE_PROVIDER_SITE_OTHER): Payer: Medicare Other | Admitting: *Deleted

## 2011-05-16 DIAGNOSIS — Z7901 Long term (current) use of anticoagulants: Secondary | ICD-10-CM

## 2011-05-16 DIAGNOSIS — D682 Hereditary deficiency of other clotting factors: Secondary | ICD-10-CM

## 2011-05-16 LAB — POCT INR: INR: 1.9

## 2011-06-13 ENCOUNTER — Ambulatory Visit (INDEPENDENT_AMBULATORY_CARE_PROVIDER_SITE_OTHER): Payer: Medicare Other | Admitting: *Deleted

## 2011-06-13 DIAGNOSIS — Z7901 Long term (current) use of anticoagulants: Secondary | ICD-10-CM

## 2011-06-13 DIAGNOSIS — D682 Hereditary deficiency of other clotting factors: Secondary | ICD-10-CM

## 2011-06-13 LAB — POCT INR: INR: 3

## 2011-07-11 ENCOUNTER — Ambulatory Visit (INDEPENDENT_AMBULATORY_CARE_PROVIDER_SITE_OTHER): Payer: Medicare Other | Admitting: *Deleted

## 2011-07-11 DIAGNOSIS — D682 Hereditary deficiency of other clotting factors: Secondary | ICD-10-CM

## 2011-07-11 DIAGNOSIS — Z7901 Long term (current) use of anticoagulants: Secondary | ICD-10-CM

## 2011-07-11 LAB — POCT INR: INR: 2.4

## 2011-08-08 ENCOUNTER — Ambulatory Visit (INDEPENDENT_AMBULATORY_CARE_PROVIDER_SITE_OTHER): Payer: Medicare Other

## 2011-08-08 DIAGNOSIS — D682 Hereditary deficiency of other clotting factors: Secondary | ICD-10-CM

## 2011-08-08 DIAGNOSIS — Z7901 Long term (current) use of anticoagulants: Secondary | ICD-10-CM

## 2011-08-08 LAB — POCT INR: INR: 2.2

## 2011-09-05 ENCOUNTER — Ambulatory Visit (INDEPENDENT_AMBULATORY_CARE_PROVIDER_SITE_OTHER): Payer: Medicare Other

## 2011-09-05 DIAGNOSIS — D682 Hereditary deficiency of other clotting factors: Secondary | ICD-10-CM

## 2011-09-05 DIAGNOSIS — Z7901 Long term (current) use of anticoagulants: Secondary | ICD-10-CM

## 2011-09-05 LAB — POCT INR: INR: 2.4

## 2011-09-08 ENCOUNTER — Other Ambulatory Visit: Payer: Self-pay | Admitting: Cardiology

## 2011-10-24 ENCOUNTER — Ambulatory Visit (INDEPENDENT_AMBULATORY_CARE_PROVIDER_SITE_OTHER): Payer: Medicare Other | Admitting: *Deleted

## 2011-10-24 DIAGNOSIS — D682 Hereditary deficiency of other clotting factors: Secondary | ICD-10-CM

## 2011-10-24 DIAGNOSIS — Z7901 Long term (current) use of anticoagulants: Secondary | ICD-10-CM

## 2011-10-24 LAB — POCT INR: INR: 1.8

## 2011-11-21 ENCOUNTER — Ambulatory Visit (INDEPENDENT_AMBULATORY_CARE_PROVIDER_SITE_OTHER): Payer: Medicare Other | Admitting: Pharmacist

## 2011-11-21 DIAGNOSIS — Z7901 Long term (current) use of anticoagulants: Secondary | ICD-10-CM

## 2011-11-21 DIAGNOSIS — D682 Hereditary deficiency of other clotting factors: Secondary | ICD-10-CM

## 2012-01-02 ENCOUNTER — Ambulatory Visit (INDEPENDENT_AMBULATORY_CARE_PROVIDER_SITE_OTHER): Payer: Medicare Other | Admitting: *Deleted

## 2012-01-02 DIAGNOSIS — D682 Hereditary deficiency of other clotting factors: Secondary | ICD-10-CM

## 2012-01-02 DIAGNOSIS — Z7901 Long term (current) use of anticoagulants: Secondary | ICD-10-CM

## 2012-01-20 ENCOUNTER — Other Ambulatory Visit: Payer: Self-pay | Admitting: Cardiology

## 2012-02-13 ENCOUNTER — Ambulatory Visit (INDEPENDENT_AMBULATORY_CARE_PROVIDER_SITE_OTHER): Payer: Medicare Other | Admitting: Pharmacist

## 2012-02-13 DIAGNOSIS — Z7901 Long term (current) use of anticoagulants: Secondary | ICD-10-CM

## 2012-02-13 DIAGNOSIS — D682 Hereditary deficiency of other clotting factors: Secondary | ICD-10-CM

## 2012-03-26 ENCOUNTER — Ambulatory Visit (INDEPENDENT_AMBULATORY_CARE_PROVIDER_SITE_OTHER): Payer: Medicare Other | Admitting: *Deleted

## 2012-03-26 DIAGNOSIS — D682 Hereditary deficiency of other clotting factors: Secondary | ICD-10-CM

## 2012-03-26 DIAGNOSIS — Z7901 Long term (current) use of anticoagulants: Secondary | ICD-10-CM

## 2012-05-07 ENCOUNTER — Ambulatory Visit (INDEPENDENT_AMBULATORY_CARE_PROVIDER_SITE_OTHER): Payer: Medicare Other | Admitting: *Deleted

## 2012-05-07 DIAGNOSIS — D682 Hereditary deficiency of other clotting factors: Secondary | ICD-10-CM

## 2012-05-07 DIAGNOSIS — Z7901 Long term (current) use of anticoagulants: Secondary | ICD-10-CM

## 2012-05-28 ENCOUNTER — Other Ambulatory Visit: Payer: Self-pay | Admitting: Cardiology

## 2012-06-18 ENCOUNTER — Ambulatory Visit (INDEPENDENT_AMBULATORY_CARE_PROVIDER_SITE_OTHER): Payer: Medicare Other

## 2012-06-18 DIAGNOSIS — Z7901 Long term (current) use of anticoagulants: Secondary | ICD-10-CM

## 2012-06-18 DIAGNOSIS — D682 Hereditary deficiency of other clotting factors: Secondary | ICD-10-CM

## 2012-06-18 LAB — POCT INR: INR: 2.4

## 2012-08-06 ENCOUNTER — Ambulatory Visit (INDEPENDENT_AMBULATORY_CARE_PROVIDER_SITE_OTHER): Payer: Medicare Other | Admitting: *Deleted

## 2012-08-06 DIAGNOSIS — Z7901 Long term (current) use of anticoagulants: Secondary | ICD-10-CM

## 2012-08-06 DIAGNOSIS — D682 Hereditary deficiency of other clotting factors: Secondary | ICD-10-CM

## 2012-08-06 LAB — POCT INR: INR: 3

## 2012-09-17 ENCOUNTER — Ambulatory Visit (INDEPENDENT_AMBULATORY_CARE_PROVIDER_SITE_OTHER): Payer: Medicare Other | Admitting: *Deleted

## 2012-09-17 DIAGNOSIS — Z7901 Long term (current) use of anticoagulants: Secondary | ICD-10-CM

## 2012-09-17 DIAGNOSIS — D682 Hereditary deficiency of other clotting factors: Secondary | ICD-10-CM

## 2012-09-17 LAB — POCT INR: INR: 2.7

## 2012-10-19 ENCOUNTER — Other Ambulatory Visit: Payer: Self-pay | Admitting: Cardiology

## 2012-10-29 ENCOUNTER — Ambulatory Visit (INDEPENDENT_AMBULATORY_CARE_PROVIDER_SITE_OTHER): Payer: Medicare Other | Admitting: Pharmacist

## 2012-10-29 DIAGNOSIS — Z7901 Long term (current) use of anticoagulants: Secondary | ICD-10-CM

## 2012-10-29 DIAGNOSIS — D682 Hereditary deficiency of other clotting factors: Secondary | ICD-10-CM

## 2012-12-10 ENCOUNTER — Ambulatory Visit (INDEPENDENT_AMBULATORY_CARE_PROVIDER_SITE_OTHER): Payer: Medicare Other | Admitting: *Deleted

## 2012-12-10 DIAGNOSIS — Z7901 Long term (current) use of anticoagulants: Secondary | ICD-10-CM

## 2012-12-10 DIAGNOSIS — D682 Hereditary deficiency of other clotting factors: Secondary | ICD-10-CM

## 2012-12-10 LAB — POCT INR: INR: 2.4

## 2012-12-21 ENCOUNTER — Other Ambulatory Visit: Payer: Self-pay | Admitting: Cardiology

## 2013-01-21 ENCOUNTER — Ambulatory Visit (INDEPENDENT_AMBULATORY_CARE_PROVIDER_SITE_OTHER): Payer: Medicare Other

## 2013-01-21 DIAGNOSIS — D682 Hereditary deficiency of other clotting factors: Secondary | ICD-10-CM

## 2013-01-21 DIAGNOSIS — Z7901 Long term (current) use of anticoagulants: Secondary | ICD-10-CM

## 2013-01-21 LAB — POCT INR: INR: 2.8

## 2013-03-04 ENCOUNTER — Ambulatory Visit (INDEPENDENT_AMBULATORY_CARE_PROVIDER_SITE_OTHER): Payer: Medicare Other | Admitting: Pharmacist

## 2013-03-04 DIAGNOSIS — Z7901 Long term (current) use of anticoagulants: Secondary | ICD-10-CM

## 2013-03-04 DIAGNOSIS — D682 Hereditary deficiency of other clotting factors: Secondary | ICD-10-CM

## 2013-04-15 ENCOUNTER — Ambulatory Visit (INDEPENDENT_AMBULATORY_CARE_PROVIDER_SITE_OTHER): Payer: Medicare Other | Admitting: Pharmacist

## 2013-04-15 DIAGNOSIS — Z7901 Long term (current) use of anticoagulants: Secondary | ICD-10-CM

## 2013-04-15 DIAGNOSIS — D682 Hereditary deficiency of other clotting factors: Secondary | ICD-10-CM

## 2013-04-15 LAB — POCT INR: INR: 2.7

## 2013-04-16 ENCOUNTER — Ambulatory Visit (INDEPENDENT_AMBULATORY_CARE_PROVIDER_SITE_OTHER): Payer: Medicare Other | Admitting: Emergency Medicine

## 2013-04-16 ENCOUNTER — Ambulatory Visit: Payer: Medicare Other

## 2013-04-16 VITALS — BP 132/76 | HR 79 | Temp 98.0°F | Resp 16 | Ht 72.0 in | Wt 258.0 lb

## 2013-04-16 DIAGNOSIS — M25562 Pain in left knee: Secondary | ICD-10-CM

## 2013-04-16 DIAGNOSIS — M25569 Pain in unspecified knee: Secondary | ICD-10-CM

## 2013-04-16 MED ORDER — HYDROCODONE-ACETAMINOPHEN 5-325 MG PO TABS: 1.0000 | ORAL_TABLET | Freq: Four times a day (QID) | ORAL | Status: DC | PRN
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
## 2013-04-22 ENCOUNTER — Ambulatory Visit (INDEPENDENT_AMBULATORY_CARE_PROVIDER_SITE_OTHER): Payer: Medicare Other | Admitting: Emergency Medicine

## 2013-04-22 VITALS — BP 124/68 | HR 114 | Temp 98.0°F | Resp 20 | Ht 70.75 in | Wt 258.6 lb

## 2013-04-22 DIAGNOSIS — K5732 Diverticulitis of large intestine without perforation or abscess without bleeding: Secondary | ICD-10-CM

## 2013-04-22 DIAGNOSIS — Z760 Encounter for issue of repeat prescription: Secondary | ICD-10-CM

## 2013-04-22 DIAGNOSIS — R109 Unspecified abdominal pain: Secondary | ICD-10-CM

## 2013-04-22 LAB — POCT CBC
Hemoglobin: 15.5 g/dL (ref 14.1–18.1)
MCH, POC: 32.2 pg — AB (ref 27–31.2)
MCHC: 31.3 g/dL — AB (ref 31.8–35.4)
MID (cbc): 0.6 (ref 0–0.9)
MPV: 9.1 fL (ref 0–99.8)
POC Granulocyte: 4.7 (ref 2–6.9)
POC MID %: 7.7 %M (ref 0–12)
Platelet Count, POC: 243 10*3/uL (ref 142–424)
RBC: 4.81 M/uL (ref 4.69–6.13)
WBC: 7.2 10*3/uL (ref 4.6–10.2)

## 2013-04-22 LAB — POCT URINALYSIS DIPSTICK
Bilirubin, UA: NEGATIVE
Blood, UA: NEGATIVE
Leukocytes, UA: NEGATIVE
Nitrite, UA: NEGATIVE
Urobilinogen, UA: 0.2
pH, UA: 6.5

## 2013-04-22 MED ORDER — METRONIDAZOLE 500 MG PO TABS: 500.0000 mg | ORAL_TABLET | Freq: Two times a day (BID) | ORAL | Status: DC

## 2013-04-22 MED ORDER — CIPROFLOXACIN HCL 500 MG PO TABS: 500.0000 mg | ORAL_TABLET | Freq: Two times a day (BID) | ORAL | Status: DC

## 2013-04-22 NOTE — Progress Notes (Addendum)
Subjective:    Patient ID: Stanley Wright, male    DOB: [birth year], 67 y.o.   MRN: [id number]  HPI This chart was scribed for   by Ladona Ridgel Day, Scribe. This patient was seen in room   and the patient's care was started at 1:59 PM.  HPI Comments: Stanley Wright is a 67 y.o. male who lives at home and is a retired Technical sales engineer.  Today he presents to the [facility] complaining of left sided abdominal pain which he attributes to a lingering diverticulitis which he took 3 days rx of cipro and flagyl w/minimal improvement. He reports followed by GI doctor for this problem and had barring enema and per results will not preform any surgical resections of his bowel at this time. He denies any urinary symptoms. He denies fever/chills.  History reviewed. No pertinent past medical history.  History reviewed. No pertinent past surgical history.  History reviewed. No pertinent family history.  History   Social History  . Marital Status: Divorced    Spouse Name: N/A    Number of Children: N/A  . Years of Education: N/A   Occupational History  . Not on file.   Social History Main Topics  . Smoking status: Former Games developer  . Smokeless tobacco: Not on file  . Alcohol Use: No  . Drug Use: No  . Sexual Activity: Yes   Other Topics Concern  . Not on file   Social History Narrative  . No narrative on file    No Known Allergies  Patient Active Problem List   Diagnosis Date Noted  . Long term current use of anticoagulant 07/30/2010  . COLONIC POLYPS 06/03/2010  . 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 GERD 06/03/2010  . HIATAL HERNIA 06/03/2010  . BENIGN PROSTATIC HYPERTROPHY, WITH OBSTRUCTION 06/03/2010  . PSORIASIS 06/03/2010    Results for orders placed in visit on 04/15/13  POCT INR      Result Value Range   INR 2.7      No diagnosis found.  No orders of the defined types were placed in this encounter.     Review of Systems Triage Vitals: BP 124/68  Pulse  114  Temp(Src) 98 F (36.7 C) (Oral)  Resp 20  Ht 5' 10.75" (1.797 m)  Wt 258 lb 9.6 oz (117.3 kg)  BMI 36.32 kg/m2  SpO2 97%    Objective:   Physical Exam Physical Exam  Nursing note and vitals reviewed. Constitutional: Patient is oriented to person, place, and time. Patient appears well-developed and well-nourished. No distress.  HENT:  Head: Normocephalic and atraumatic.  Neck: Neck supple. No tracheal deviation present.  Cardiovascular: Normal rate, regular rhythm and normal heart sounds.   No murmur heard. Pulmonary/Chest: Effort normal and breath sounds normal. No respiratory distress. Patient has no wheezes. Patient has no rales.  Musculoskeletal: Normal range of motion.  Neurological: Patient is alert and oriented to person, place, and time.  Skin: Skin is warm and dry.  Psychiatric: Patient has a normal mood and affect. Patient's behavior is normal.  The abdomen is obese. Bowel sounds are normal. He is tenderness to palpation left mid abdomen this is without rebound. Results for orders placed in visit on 04/22/13  POCT CBC      Result Value Range   WBC 7.2  4.6 - 10.2 K/uL   Lymph, poc 1.9  0.6 - 3.4   POC LYMPH PERCENT 27.0  10 - 50 %L   MID (cbc)  0.6  0 - 0.9   POC MID % 7.7  0 - 12 %M   POC Granulocyte 4.7  2 - 6.9   Granulocyte percent 65.3  37 - 80 %G   RBC 4.81  4.69 - 6.13 M/uL   Hemoglobin 15.5  14.1 - 18.1 g/dL   HCT, POC 78.2  95.6 - 53.7 %   MCV 103.2 (*) 80 - 97 fL   MCH, POC 32.2 (*) 27 - 31.2 pg   MCHC 31.3 (*) 31.8 - 35.4 g/dL   RDW, POC 21.3     Platelet Count, POC 243  142 - 424 K/uL   MPV 9.1  0 - 99.8 fL  POCT URINALYSIS DIPSTICK      Result Value Range   Color, UA yellow     Clarity, UA clear     Glucose, UA neg     Bilirubin, UA neg     Ketones, UA neg     Spec Grav, UA 1.015     Blood, UA neg     pH, UA 6.5     Protein, UA neg     Urobilinogen, UA 0.2     Nitrite, UA neg     Leukocytes, UA Negative          Assessment &  Plan:  I suspect he has a mild flare of his diverticulitis. We'll treat with Cipro and Flagyl. He will take each of these medications for 7 days. He would then have a one-week prescription left but he can take on his next mission trip. He understands the risk of perforation of diverticula. He will contact the Coumadin clinic regarding dosage of his medication.

## 2013-04-22 NOTE — Patient Instructions (Signed)
Diverticulitis °A diverticulum is a small pouch or sac on the colon. Diverticulosis is the presence of these diverticula on the colon. Diverticulitis is the irritation (inflammation) or infection of diverticula. °CAUSES  °The colon and its diverticula contain bacteria. If food particles block the tiny opening to a diverticulum, the bacteria inside can grow and cause an increase in pressure. This leads to infection and inflammation and is called diverticulitis. °SYMPTOMS  °· Abdominal pain and tenderness. Usually, the pain is located on the left side of your abdomen. However, it could be located elsewhere. °· Fever. °· Bloating. °· Feeling sick to your stomach (nausea). °· Throwing up (vomiting). °· Abnormal stools. °DIAGNOSIS  °Your caregiver will take a history and perform a physical exam. Since many things can cause abdominal pain, other tests may be necessary. Tests may include: °· Blood tests. °· Urine tests. °· X-ray of the abdomen. °· CT scan of the abdomen. °Sometimes, surgery is needed to determine if diverticulitis or other conditions are causing your symptoms. °TREATMENT  °Most of the time, you can be treated without surgery. Treatment includes: °· Resting the bowels by only having liquids for a few days. As you improve, you will need to eat a low-fiber diet. °· Intravenous (IV) fluids if you are losing body fluids (dehydrated). °· Antibiotic medicines that treat infections may be given. °· Pain and nausea medicine, if needed. °· Surgery if the inflamed diverticulum has burst. °HOME CARE INSTRUCTIONS  °· Try a clear liquid diet (broth, tea, or water for as long as directed by your caregiver). You may then gradually begin a low-fiber diet as tolerated.  °A low-fiber diet is a diet with less than 10 grams of fiber. Choose the foods below to reduce fiber in the diet: °· White breads, cereals, rice, and pasta. °· Cooked fruits and vegetables or soft fresh fruits and vegetables without the skin. °· Ground or  well-cooked tender beef, ham, veal, lamb, pork, or poultry. °· Eggs and seafood. °· After your diverticulitis symptoms have improved, your caregiver may put you on a high-fiber diet. A high-fiber diet includes 14 grams of fiber for every 1000 calories consumed. For a standard 2000 calorie diet, you would need 28 grams of fiber. Follow these diet guidelines to help you increase the fiber in your diet. It is important to slowly increase the amount fiber in your diet to avoid gas, constipation, and bloating. °· Choose whole-grain breads, cereals, pasta, and brown rice. °· Choose fresh fruits and vegetables with the skin on. Do not overcook vegetables because the more vegetables are cooked, the more fiber is lost. °· Choose more nuts, seeds, legumes, dried peas, beans, and lentils. °· Look for food products that have greater than 3 grams of fiber per serving on the Nutrition Facts label. °· Take all medicine as directed by your caregiver. °· If your caregiver has given you a follow-up appointment, it is very important that you go. Not going could result in lasting (chronic) or permanent injury, pain, and disability. If there is any problem keeping the appointment, call to reschedule. °SEEK MEDICAL CARE IF:  °· Your pain does not improve. °· You have a hard time advancing your diet beyond clear liquids. °· Your bowel movements do not return to normal. °SEEK IMMEDIATE MEDICAL CARE IF:  °· Your pain becomes worse. °· You have an oral temperature above 102° F (38.9° C), not controlled by medicine. °· You have repeated vomiting. °· You have bloody or black, tarry stools. °·   Symptoms that brought you to your caregiver become worse or are not getting better. °MAKE SURE YOU:  °· Understand these instructions. °· Will watch your condition. °· Will get help right away if you are not doing well or get worse. °Document Released: 03/12/2005 Document Revised: 08/25/2011 Document Reviewed: 07/08/2010 °Exit[employer]® Patient Information  ©2014 [employer]. ° °

## 2013-05-27 ENCOUNTER — Ambulatory Visit (INDEPENDENT_AMBULATORY_CARE_PROVIDER_SITE_OTHER): Payer: Medicare Other | Admitting: General Practice

## 2013-05-27 DIAGNOSIS — D682 Hereditary deficiency of other clotting factors: Secondary | ICD-10-CM

## 2013-05-27 DIAGNOSIS — Z7901 Long term (current) use of anticoagulants: Secondary | ICD-10-CM

## 2013-06-14 ENCOUNTER — Other Ambulatory Visit: Payer: Self-pay | Admitting: Cardiology

## 2013-06-21 ENCOUNTER — Other Ambulatory Visit: Payer: Self-pay | Admitting: Cardiology

## 2013-06-23 ENCOUNTER — Other Ambulatory Visit: Payer: Self-pay | Admitting: *Deleted

## 2013-06-23 MED ORDER — WARFARIN SODIUM 5 MG PO TABS: ORAL_TABLET | ORAL | Status: DC

## 2013-06-28 ENCOUNTER — Encounter: Payer: Self-pay | Admitting: Emergency Medicine

## 2013-06-28 ENCOUNTER — Ambulatory Visit (INDEPENDENT_AMBULATORY_CARE_PROVIDER_SITE_OTHER): Payer: Medicare Other | Admitting: Emergency Medicine

## 2013-06-28 VITALS — BP 134/84 | HR 61 | Temp 98.6°F | Resp 16 | Ht 70.5 in | Wt 270.0 lb

## 2013-06-28 DIAGNOSIS — R635 Abnormal weight gain: Secondary | ICD-10-CM

## 2013-06-28 DIAGNOSIS — Z1211 Encounter for screening for malignant neoplasm of colon: Secondary | ICD-10-CM

## 2013-06-28 DIAGNOSIS — K5732 Diverticulitis of large intestine without perforation or abscess without bleeding: Secondary | ICD-10-CM

## 2013-06-28 DIAGNOSIS — Z136 Encounter for screening for cardiovascular disorders: Secondary | ICD-10-CM

## 2013-06-28 DIAGNOSIS — Z139 Encounter for screening, unspecified: Secondary | ICD-10-CM

## 2013-06-28 DIAGNOSIS — G4733 Obstructive sleep apnea (adult) (pediatric): Secondary | ICD-10-CM

## 2013-06-28 DIAGNOSIS — N400 Enlarged prostate without lower urinary tract symptoms: Secondary | ICD-10-CM

## 2013-06-28 DIAGNOSIS — Z0000 Encounter for general adult medical examination without abnormal findings: Secondary | ICD-10-CM

## 2013-06-28 LAB — COMPLETE METABOLIC PANEL WITH GFR
ALT: 24 U/L (ref 0–53)
AST: 24 U/L (ref 0–37)
Albumin: 4.1 g/dL (ref 3.5–5.2)
Alkaline Phosphatase: 48 U/L (ref 39–117)
BILIRUBIN TOTAL: 0.6 mg/dL (ref 0.3–1.2)
BUN: 11 mg/dL (ref 6–23)
CO2: 26 mEq/L (ref 19–32)
Calcium: 8.9 mg/dL (ref 8.4–10.5)
Chloride: 104 mEq/L (ref 96–112)
Creat: 0.85 mg/dL (ref 0.50–1.35)
GLUCOSE: 81 mg/dL (ref 70–99)
Potassium: 4 mEq/L (ref 3.5–5.3)
Sodium: 139 mEq/L (ref 135–145)
Total Protein: 6.7 g/dL (ref 6.0–8.3)

## 2013-06-28 LAB — POCT URINALYSIS DIPSTICK
Bilirubin, UA: NEGATIVE
Glucose, UA: NEGATIVE
KETONES UA: NEGATIVE
LEUKOCYTES UA: NEGATIVE
Nitrite, UA: NEGATIVE
PH UA: 6.5
PROTEIN UA: NEGATIVE
RBC UA: NEGATIVE
Spec Grav, UA: 1.025
Urobilinogen, UA: 0.2

## 2013-06-28 LAB — CBC WITH DIFFERENTIAL/PLATELET
BASOS PCT: 0 % (ref 0–1)
Basophils Absolute: 0 10*3/uL (ref 0.0–0.1)
EOS PCT: 2 % (ref 0–5)
Eosinophils Absolute: 0.2 10*3/uL (ref 0.0–0.7)
HCT: 43.2 % (ref 39.0–52.0)
Hemoglobin: 14.9 g/dL (ref 13.0–17.0)
Lymphocytes Relative: 25 % (ref 12–46)
Lymphs Abs: 1.6 10*3/uL (ref 0.7–4.0)
MCH: 32 pg (ref 26.0–34.0)
MCHC: 34.5 g/dL (ref 30.0–36.0)
MCV: 92.9 fL (ref 78.0–100.0)
MONO ABS: 0.6 10*3/uL (ref 0.1–1.0)
MONOS PCT: 10 % (ref 3–12)
NEUTROS PCT: 63 % (ref 43–77)
Neutro Abs: 3.9 10*3/uL (ref 1.7–7.7)
Platelets: 184 10*3/uL (ref 150–400)
RBC: 4.65 MIL/uL (ref 4.22–5.81)
RDW: 14.2 % (ref 11.5–15.5)
WBC: 6.2 10*3/uL (ref 4.0–10.5)

## 2013-06-28 LAB — LIPID PANEL
Cholesterol: 207 mg/dL — ABNORMAL HIGH (ref 0–200)
HDL: 53 mg/dL (ref >39)
LDL CALC: 130 mg/dL — AB (ref 0–99)
TRIGLYCERIDES: 121 mg/dL (ref <150)
Total CHOL/HDL Ratio: 3.9 Ratio
VLDL: 24 mg/dL (ref 0–40)

## 2013-06-28 LAB — TSH: TSH: 1.967 u[IU]/mL (ref 0.350–4.500)

## 2013-06-28 LAB — IFOBT (OCCULT BLOOD): IFOBT: NEGATIVE

## 2013-06-28 MED ORDER — METRONIDAZOLE 500 MG PO TABS: 500.0000 mg | ORAL_TABLET | Freq: Three times a day (TID) | ORAL | Status: DC

## 2013-06-28 MED ORDER — DOXYCYCLINE HYCLATE 100 MG PO CAPS: ORAL_CAPSULE | ORAL | Status: DC

## 2013-06-28 MED ORDER — DIPHENOXYLATE-ATROPINE 2.5-0.025 MG PO TABS: 1.0000 | ORAL_TABLET | Freq: Four times a day (QID) | ORAL | Status: DC | PRN

## 2013-06-28 MED ORDER — CIPROFLOXACIN HCL 500 MG PO TABS: 500.0000 mg | ORAL_TABLET | Freq: Two times a day (BID) | ORAL | Status: DC

## 2013-06-28 NOTE — Progress Notes (Signed)
Subjective:    Patient ID: Stanley Wright, male    DOB: Mar 29, 1946, 68 y.o.   MRN: [id number]  HPI This chart was scribed for Stanley Wright by Lovena Le Day, Scribe. This patient was seen in room 21 and the patient's care was started at 3:00 PM.  HPI Comments: Stanley Wright is a 68 y.o. 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   He reports that he has been feeling well overall. He reports that he would like to loose some weight and has fluctuated w/his weight significantly in the past while trying to loose weight.   He would also like to upgrade C pap to A pap, he has his paperwork with him for this reason.   He has not had a shingles vaccine before. He is up to date on both the PNA and flu vaccines.  He is currently on coumadin and plans to briefly discontinue this medicine so that he can have a colonoscopy sometime soon since his last was in 2005.  He was recently was on a missions trip to [place] and did a vigorous hike in which he states did okay with but was very fatigued physically and compared it to about 30 stress tests making him moderately short of breath but had not CP.   In his last visit 11/07, he was having a questionable flare up of diverticulitis. He reports no similar symptoms today and has had no recent illnesses.   Patient Active Problem List   Diagnosis Date Noted  . Long term current use of anticoagulant 07/30/2010  . COLONIC POLYPS 06/03/2010  . 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 GERD 06/03/2010  . HIATAL HERNIA 06/03/2010  . BENIGN PROSTATIC HYPERTROPHY, WITH OBSTRUCTION 06/03/2010  . PSORIASIS 06/03/2010   Past Surgical History  Procedure Laterality Date  . Prostate surgery    . Hernia repair     Family History  Problem Relation Age of Onset  . Cancer Father   . Cancer Sister   . Heart disease Brother   . Cancer Maternal Grandmother   . Stroke Paternal  Grandfather    History   Social History  . Marital Status: Divorced    Spouse Name: N/A    Number of Children: N/A  . Years of Education: N/A   Occupational History  . Not on file.   Social History Main Topics  . Smoking status: Former Research scientist (life sciences)  . Smokeless tobacco: Not on file  . Alcohol Use: No  . Drug Use: No  . Sexual Activity: Yes   Other Topics Concern  . Not on file   Social History Narrative  . No narrative on file   No Known Allergies  Results for orders placed in visit on 05/27/13  POCT INR      Result Value Range   INR 2.8     Review of Systems  Constitutional: Negative for fever and chills.  Respiratory: Negative for cough and shortness of breath.   Cardiovascular: Negative for chest pain.  Gastrointestinal: Negative for abdominal pain.  Musculoskeletal: Negative for back pain.      Objective:   Physical Exam Nursing note and vitals reviewed. Constitutional: Patient is oriented to person, place, and time. Patient appears well-developed and well-nourished. No distress.  HENT:  Head: Normocephalic and atraumatic.  Neck: Neck supple. No tracheal deviation present.  Cardiovascular: Normal rate, regular rhythm and normal heart sounds.  No murmur heard. Pulmonary/Chest: Effort normal and breath sounds normal. No respiratory distress. Patient has no wheezes. Patient has no rales.  Musculoskeletal: Normal range of motion.  Neurological: Patient is alert and oriented to person, place, and time.  Skin: Skin is warm and dry.  Psychiatric: Patient has a normal mood and affect. Patient's behavior is normal.   Triage Vitals: BP 134/84  Pulse 61  Temp(Src) 98.6 F (37 C)  Resp 16  Ht 5' 10.5" (1.791 m)  Wt 270 lb (122.471 kg)  BMI 38.18 kg/m2  SpO2 95% Results for orders placed in visit on 06/28/13  POCT URINALYSIS DIPSTICK      Result Value Range   Color, UA yellow     Clarity, UA clear     Glucose, UA neg     Bilirubin, UA neg     Ketones, UA neg      Spec Grav, UA 1.025     Blood, UA neg     pH, UA 6.5     Protein, UA neg     Urobilinogen, UA 0.2     Nitrite, UA neg     Leukocytes, UA Negative    IFOBT (OCCULT BLOOD)      Result Value Range   IFOBT Negative     DIAGNOSTIC STUDIES: Oxygen Saturation is 95% on room air, adequate by my interpretation.       Assessment & Plan:  Biggest problem is patient's weight. I did give him refills on his prescriptions carry with him on his admission trips. He will also be on doxy prophylaxis for malaria. He was given prescriptions for Cipro and Flagyl and Lomotil. Referral made to cardiology but because I do have concerns with him being a [place] and being sure he is physically okay for these strenuous mission trips. I personally performed the services described in this documentation, which was scribed in my presence. The recorded information has been reviewed and is accurate.

## 2013-06-28 NOTE — Progress Notes (Signed)
   Subjective:    Patient ID: Stanley Wright, male    DOB: [birth year], 68 y.o.   MRN: [id number]  HPI    Review of Systems  Cardiovascular: Positive for leg swelling.  Skin: Positive for rash.       Objective:   Physical Exam        Assessment & Plan:

## 2013-06-29 LAB — PSA, MEDICARE: PSA: 1.06 ng/mL (ref <=4.00)

## 2013-07-01 ENCOUNTER — Telehealth: Payer: Self-pay

## 2013-07-01 NOTE — Telephone Encounter (Signed)
Patient presented papers to Dr. Everlene Farrier at last [place] for a [facility] machine.  He did not get the papers back.  Please find and call [name] at [phone]

## 2013-07-07 ENCOUNTER — Ambulatory Visit (INDEPENDENT_AMBULATORY_CARE_PROVIDER_SITE_OTHER): Payer: Medicare Other | Admitting: *Deleted

## 2013-07-07 DIAGNOSIS — Z7901 Long term (current) use of anticoagulants: Secondary | ICD-10-CM

## 2013-07-07 DIAGNOSIS — D682 Hereditary deficiency of other clotting factors: Secondary | ICD-10-CM

## 2013-07-07 LAB — POCT INR: INR: 2.4

## 2013-07-19 ENCOUNTER — Encounter: Payer: Medicare Other | Admitting: Emergency Medicine

## 2013-08-01 ENCOUNTER — Encounter: Payer: Self-pay | Admitting: *Deleted

## 2013-08-01 ENCOUNTER — Encounter: Payer: Self-pay | Admitting: Cardiology

## 2013-08-04 ENCOUNTER — Encounter: Payer: Self-pay | Admitting: Cardiology

## 2013-08-04 ENCOUNTER — Ambulatory Visit (INDEPENDENT_AMBULATORY_CARE_PROVIDER_SITE_OTHER): Payer: Medicare Other | Admitting: Cardiology

## 2013-08-04 VITALS — BP 144/104 | HR 92 | Ht 70.5 in | Wt 272.0 lb

## 2013-08-04 DIAGNOSIS — D682 Hereditary deficiency of other clotting factors: Secondary | ICD-10-CM | POA: Diagnosis not present

## 2013-08-04 DIAGNOSIS — Z7901 Long term (current) use of anticoagulants: Secondary | ICD-10-CM

## 2013-08-04 NOTE — Assessment & Plan Note (Signed)
Patient is scheduled for colonoscopy. They would like for him to be off of Coumadin for his procedure. Given the history of 2 pulmonary emboli I think he needs a Lovenox bridge. We will arrange this with our Coumadin clinic.

## 2013-08-04 NOTE — Assessment & Plan Note (Signed)
History of pulmonary embolusx2. Last in 2008. He will need lifelong Coumadin.

## 2013-08-04 NOTE — Patient Instructions (Signed)
Your physician wants you to follow-up in: ONE YEAR WITH DR CRENSHAW You will receive a reminder letter in the mail two months in advance. If you don't receive a letter, please call our office to schedule the follow-up appointment.  

## 2013-08-04 NOTE — Progress Notes (Signed)
     HPI: 68 year old male for evaluation of dyspnea; history of factor V Leiden with 2 previous pulmonary emboli. He is on chronic Coumadin. He has been seen in his office previously but not since December of 2011. Patient has some DOE and mild bilateral pedal edema. This is not changed. No orthopnea or PND. No chest pain, palpitations or syncope. He is scheduled for a colonoscopy and we were asked to evaluate prior to the procedure for discontinuing Coumadin.  Current Outpatient Prescriptions  Medication Sig Dispense Refill  . ciprofloxacin (CIPRO) 500 MG tablet Take 1 tablet (500 mg total) by mouth 2 (two) times daily.  28 tablet  0  . diphenoxylate-atropine (LOMOTIL) 2.5-0.025 MG per tablet Take 1 tablet by mouth 4 (four) times daily as needed for diarrhea or loose stools.  30 tablet  1  . doxycycline (VIBRAMYCIN) 100 MG capsule Take one tablet daily one day prior to your trip during the entire trip and for one month after you return  60 capsule  0  . metroNIDAZOLE (FLAGYL) 500 MG tablet Take 1 tablet (500 mg total) by mouth 3 (three) times daily.  30 tablet  2  . warfarin (COUMADIN) 5 MG tablet TAKE AS DIRECTED BY COUMADIN CLINIC  40 tablet  3   No current facility-administered medications for this visit.    No Known Allergies  Past Medical History  Diagnosis Date  . GERD (gastroesophageal reflux disease)   . BENIGN PROSTATIC HYPERTROPHY, WITH OBSTRUCTION   . COLONIC POLYPS   . Factor 5 Leiden mutation, heterozygous   . Diverticulitis   . HIATAL HERNIA   . Long term current use of anticoagulant   . PSORIASIS     Past Surgical History  Procedure Laterality Date  . Prostate surgery    . Hernia repair    . Lung removal, partial      History   Social History  . Marital Status: Divorced    Spouse Name: N/A    Number of Children: 2  . Years of Education: N/A   Occupational History  .     Social History Main Topics  . Smoking status: Former Research scientist (life sciences)  . Smokeless tobacco:  Not on file  . Alcohol Use: No  . Drug Use: No  . Sexual Activity: Yes   Other Topics Concern  . Not on file   Social History Narrative  . No narrative on file    Family History  Problem Relation Age of Onset  . Cancer Father   . Cancer Sister   . Heart disease Brother   . Cancer Maternal Grandmother   . Stroke Paternal Grandfather     ROS: no fevers or chills, productive cough, hemoptysis, dysphasia, odynophagia, melena, hematochezia, dysuria, hematuria, rash, seizure activity, orthopnea, PND, pedal edema, claudication. Remaining systems are negative.  Physical Exam:   Blood pressure 144/104, pulse 92, height 5' 10.5" (1.791 m), weight 272 lb (123.378 kg).  General:  Well developed/obese in NAD Skin warm/dry Patient not depressed No peripheral clubbing Back-normal HEENT-normal/normal eyelids Neck supple/normal carotid upstroke bilaterally; no bruits; no JVD; no thyromegaly chest - CTA/ normal expansion CV - RRR/normal S1 and S2; no murmurs, rubs or gallops;  PMI nondisplaced Abdomen -NT/ND, no HSM, no mass, + bowel sounds, no bruit 2+ femoral pulses, no bruits Ext-trace to 1+ ankle edema, no chords, 2+ DP Neuro-grossly nonfocal  ECG 06/28/2013-sinus rhythm, left axis deviation, RV conduction delay.

## 2013-08-12 ENCOUNTER — Ambulatory Visit (INDEPENDENT_AMBULATORY_CARE_PROVIDER_SITE_OTHER): Payer: Medicare Other | Admitting: *Deleted

## 2013-08-12 DIAGNOSIS — Z5181 Encounter for therapeutic drug level monitoring: Secondary | ICD-10-CM

## 2013-08-12 DIAGNOSIS — Z7901 Long term (current) use of anticoagulants: Secondary | ICD-10-CM

## 2013-08-12 DIAGNOSIS — D682 Hereditary deficiency of other clotting factors: Secondary | ICD-10-CM

## 2013-08-12 LAB — POCT INR: INR: 3

## 2013-08-12 MED ORDER — ENOXAPARIN SODIUM 120 MG/0.8ML ~~LOC~~ SOLN: 120.0000 mg | Freq: Two times a day (BID) | SUBCUTANEOUS | Status: DC

## 2013-08-12 NOTE — Patient Instructions (Signed)
08/11/2013 last day to take coumadin  08/12/2013 no coumadin no Lovenox 08/13/2013 no coumadin start Lovenox 120 mg 8pm 08/14/2013 no coumadin Lovenox 120 mg 8am and Lovenox 120mg  8pm 08/15/2013 no coumadin Lovenox 120mg  8am and Lovenox 120mg  8pm 08/16/2013 no coumadin Lovenox 120mg  8am take in am only 08/17/2013 no coumadin no lovenox day of procedure and when instructed by MD doing procedure to restart coumadin and Lovenox restart Lovenox at same dose 120mg  8am and lovenox 120mg  8pm and restart coumadin at same dose except take extra 1/2 tablet for 2 days and continue Lovenox and coumadin until seen in clinic on Monday March 9th

## 2013-08-22 ENCOUNTER — Ambulatory Visit (INDEPENDENT_AMBULATORY_CARE_PROVIDER_SITE_OTHER): Payer: Medicare Other | Admitting: Pharmacist

## 2013-08-22 DIAGNOSIS — D682 Hereditary deficiency of other clotting factors: Secondary | ICD-10-CM

## 2013-08-22 DIAGNOSIS — Z5181 Encounter for therapeutic drug level monitoring: Secondary | ICD-10-CM

## 2013-08-22 DIAGNOSIS — Z7901 Long term (current) use of anticoagulants: Secondary | ICD-10-CM

## 2013-08-22 LAB — POCT INR: INR: 1.8

## 2013-08-25 ENCOUNTER — Ambulatory Visit (INDEPENDENT_AMBULATORY_CARE_PROVIDER_SITE_OTHER): Payer: Medicare Other | Admitting: Pharmacist

## 2013-08-25 DIAGNOSIS — D682 Hereditary deficiency of other clotting factors: Secondary | ICD-10-CM

## 2013-08-25 DIAGNOSIS — Z7901 Long term (current) use of anticoagulants: Secondary | ICD-10-CM

## 2013-08-25 DIAGNOSIS — Z5181 Encounter for therapeutic drug level monitoring: Secondary | ICD-10-CM

## 2013-08-25 LAB — POCT INR: INR: 2

## 2013-09-12 ENCOUNTER — Encounter: Payer: Self-pay | Admitting: Emergency Medicine

## 2013-11-17 ENCOUNTER — Ambulatory Visit (INDEPENDENT_AMBULATORY_CARE_PROVIDER_SITE_OTHER): Payer: Medicare Other

## 2013-11-17 DIAGNOSIS — Z5181 Encounter for therapeutic drug level monitoring: Secondary | ICD-10-CM

## 2013-11-17 DIAGNOSIS — Z7901 Long term (current) use of anticoagulants: Secondary | ICD-10-CM

## 2013-11-17 DIAGNOSIS — D682 Hereditary deficiency of other clotting factors: Secondary | ICD-10-CM

## 2013-11-17 LAB — POCT INR: INR: 3.2

## 2013-11-30 ENCOUNTER — Other Ambulatory Visit: Payer: Self-pay | Admitting: *Deleted

## 2013-11-30 ENCOUNTER — Other Ambulatory Visit: Payer: Self-pay | Admitting: Cardiology

## 2013-12-15 ENCOUNTER — Ambulatory Visit (INDEPENDENT_AMBULATORY_CARE_PROVIDER_SITE_OTHER): Payer: Medicare Other | Admitting: *Deleted

## 2013-12-15 DIAGNOSIS — D682 Hereditary deficiency of other clotting factors: Secondary | ICD-10-CM

## 2013-12-15 DIAGNOSIS — Z7901 Long term (current) use of anticoagulants: Secondary | ICD-10-CM

## 2013-12-15 DIAGNOSIS — Z5181 Encounter for therapeutic drug level monitoring: Secondary | ICD-10-CM

## 2013-12-15 LAB — POCT INR: INR: 3.6

## 2014-01-05 ENCOUNTER — Ambulatory Visit (INDEPENDENT_AMBULATORY_CARE_PROVIDER_SITE_OTHER): Payer: Medicare Other | Admitting: *Deleted

## 2014-01-05 DIAGNOSIS — Z7901 Long term (current) use of anticoagulants: Secondary | ICD-10-CM

## 2014-01-05 DIAGNOSIS — D682 Hereditary deficiency of other clotting factors: Secondary | ICD-10-CM

## 2014-01-05 DIAGNOSIS — Z5181 Encounter for therapeutic drug level monitoring: Secondary | ICD-10-CM

## 2014-01-05 LAB — POCT INR: INR: 2.5

## 2014-02-03 ENCOUNTER — Ambulatory Visit (INDEPENDENT_AMBULATORY_CARE_PROVIDER_SITE_OTHER): Payer: Medicare Other | Admitting: Pharmacist

## 2014-02-03 DIAGNOSIS — D682 Hereditary deficiency of other clotting factors: Secondary | ICD-10-CM

## 2014-02-03 DIAGNOSIS — Z7901 Long term (current) use of anticoagulants: Secondary | ICD-10-CM

## 2014-02-03 DIAGNOSIS — Z5181 Encounter for therapeutic drug level monitoring: Secondary | ICD-10-CM

## 2014-02-03 LAB — POCT INR: INR: 2.3

## 2014-02-13 ENCOUNTER — Other Ambulatory Visit: Payer: Self-pay

## 2014-02-13 MED ORDER — WARFARIN SODIUM 5 MG PO TABS: ORAL_TABLET | ORAL | Status: DC

## 2014-03-17 ENCOUNTER — Ambulatory Visit (INDEPENDENT_AMBULATORY_CARE_PROVIDER_SITE_OTHER): Payer: Medicare Other | Admitting: *Deleted

## 2014-03-17 ENCOUNTER — Telehealth: Payer: Self-pay | Admitting: Cardiology

## 2014-03-17 DIAGNOSIS — Z7901 Long term (current) use of anticoagulants: Secondary | ICD-10-CM

## 2014-03-17 DIAGNOSIS — D682 Hereditary deficiency of other clotting factors: Secondary | ICD-10-CM

## 2014-03-17 DIAGNOSIS — Z5181 Encounter for therapeutic drug level monitoring: Secondary | ICD-10-CM

## 2014-03-17 LAB — POCT INR: INR: 3.2

## 2014-03-17 NOTE — Telephone Encounter (Signed)
This is patient of Dr Stanford Breed

## 2014-04-28 ENCOUNTER — Ambulatory Visit (INDEPENDENT_AMBULATORY_CARE_PROVIDER_SITE_OTHER): Payer: Medicare Other | Admitting: *Deleted

## 2014-04-28 DIAGNOSIS — D682 Hereditary deficiency of other clotting factors: Secondary | ICD-10-CM

## 2014-04-28 DIAGNOSIS — Z7901 Long term (current) use of anticoagulants: Secondary | ICD-10-CM

## 2014-04-28 DIAGNOSIS — Z5181 Encounter for therapeutic drug level monitoring: Secondary | ICD-10-CM

## 2014-04-28 LAB — POCT INR: INR: 3.5

## 2014-05-10 ENCOUNTER — Ambulatory Visit (INDEPENDENT_AMBULATORY_CARE_PROVIDER_SITE_OTHER): Payer: Medicare Other | Admitting: *Deleted

## 2014-05-10 DIAGNOSIS — Z7901 Long term (current) use of anticoagulants: Secondary | ICD-10-CM

## 2014-05-10 DIAGNOSIS — D682 Hereditary deficiency of other clotting factors: Secondary | ICD-10-CM

## 2014-05-10 DIAGNOSIS — Z5181 Encounter for therapeutic drug level monitoring: Secondary | ICD-10-CM

## 2014-05-10 LAB — POCT INR: INR: 2.6

## 2014-05-31 ENCOUNTER — Ambulatory Visit (INDEPENDENT_AMBULATORY_CARE_PROVIDER_SITE_OTHER): Payer: Medicare Other | Admitting: Pharmacist

## 2014-05-31 DIAGNOSIS — D682 Hereditary deficiency of other clotting factors: Secondary | ICD-10-CM

## 2014-05-31 DIAGNOSIS — Z7901 Long term (current) use of anticoagulants: Secondary | ICD-10-CM

## 2014-05-31 DIAGNOSIS — Z5181 Encounter for therapeutic drug level monitoring: Secondary | ICD-10-CM

## 2014-05-31 LAB — POCT INR: INR: 2.2

## 2014-07-12 ENCOUNTER — Ambulatory Visit (INDEPENDENT_AMBULATORY_CARE_PROVIDER_SITE_OTHER): Payer: Medicare HMO | Admitting: *Deleted

## 2014-07-12 DIAGNOSIS — D682 Hereditary deficiency of other clotting factors: Secondary | ICD-10-CM

## 2014-07-12 DIAGNOSIS — Z7901 Long term (current) use of anticoagulants: Secondary | ICD-10-CM

## 2014-07-12 DIAGNOSIS — Z5181 Encounter for therapeutic drug level monitoring: Secondary | ICD-10-CM

## 2014-07-12 LAB — POCT INR: INR: 2.2

## 2014-07-12 MED ORDER — WARFARIN SODIUM 5 MG PO TABS: ORAL_TABLET | ORAL | Status: DC

## 2014-08-25 ENCOUNTER — Ambulatory Visit (INDEPENDENT_AMBULATORY_CARE_PROVIDER_SITE_OTHER): Payer: Medicare HMO

## 2014-08-25 DIAGNOSIS — Z5181 Encounter for therapeutic drug level monitoring: Secondary | ICD-10-CM

## 2014-08-25 DIAGNOSIS — Z7901 Long term (current) use of anticoagulants: Secondary | ICD-10-CM

## 2014-08-25 DIAGNOSIS — D682 Hereditary deficiency of other clotting factors: Secondary | ICD-10-CM

## 2014-08-25 LAB — POCT INR
INR: 1.2
INR: 1.2

## 2014-09-01 ENCOUNTER — Ambulatory Visit (INDEPENDENT_AMBULATORY_CARE_PROVIDER_SITE_OTHER): Payer: Medicare HMO | Admitting: Pharmacist

## 2014-09-01 DIAGNOSIS — Z5181 Encounter for therapeutic drug level monitoring: Secondary | ICD-10-CM

## 2014-09-01 DIAGNOSIS — Z7901 Long term (current) use of anticoagulants: Secondary | ICD-10-CM

## 2014-09-01 DIAGNOSIS — D682 Hereditary deficiency of other clotting factors: Secondary | ICD-10-CM

## 2014-09-01 LAB — POCT INR: INR: 2

## 2014-09-18 NOTE — Progress Notes (Signed)
      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 He is on chronic Coumadin. Abdominal CT 2011 showed no aneurysm. Since last seen, the patient denies any dyspnea on exertion, orthopnea, PND, pedal edema, palpitations, syncope or chest pain.   Current Outpatient Prescriptions  Medication Sig Dispense Refill  . diphenoxylate-atropine (LOMOTIL) 2.5-0.025 MG per tablet Take 1 tablet by mouth 4 (four) times daily as needed for diarrhea or loose stools. 30 tablet 1  . doxycycline (VIBRAMYCIN) 100 MG capsule Take one tablet daily one day prior to your trip during the entire trip and for one month after you return 60 capsule 0  . warfarin (COUMADIN) 5 MG tablet Take as directed by Coumadin Clinic 120 tablet 2   No current facility-administered medications for this visit.     Past Medical History  Diagnosis Date  . GERD (gastroesophageal reflux disease)   . BENIGN PROSTATIC HYPERTROPHY, WITH OBSTRUCTION   . COLONIC POLYPS   . Factor 5 Leiden mutation, heterozygous   . Diverticulitis   . HIATAL HERNIA   . Long term current use of anticoagulant   . PSORIASIS   . Pulmonary embolus     Past Surgical History  Procedure Laterality Date  . Prostate surgery    . Hernia repair    . Lung removal, partial      History   Social History  . Marital Status: Divorced    Spouse Name: N/A  . Number of Children: 2  . Years of Education: N/A   Occupational History  .     Social History Main Topics  . Smoking status: Former Research scientist (life sciences)  . Smokeless tobacco: Not on file  . Alcohol Use: No  . Drug Use: No  . Sexual Activity: Yes   Other Topics Concern  . Not on file   Social History Narrative    ROS: no fevers or chills, productive cough, hemoptysis, dysphasia, odynophagia, melena, hematochezia, dysuria, hematuria, rash, seizure activity, orthopnea, PND, pedal edema, claudication. Remaining systems are negative.  Physical Exam: Well-developed obese in no  acute distress.  Skin is warm and dry.  HEENT is normal.  Neck is supple.  Chest is clear to auscultation with normal expansion.  Cardiovascular exam is regular rate and rhythm.  Abdominal exam nontender or distended. No masses palpated. Extremities show no edema. neuro grossly intact  ECG sinus rhythm at a rate of 90. Left axis deviation. Cannot rule out prior inferior infarct.

## 2014-09-19 ENCOUNTER — Encounter: Payer: Self-pay | Admitting: Cardiology

## 2014-09-19 ENCOUNTER — Ambulatory Visit (INDEPENDENT_AMBULATORY_CARE_PROVIDER_SITE_OTHER): Payer: Medicare HMO | Admitting: Cardiology

## 2014-09-19 VITALS — BP 122/90 | HR 90 | Ht 72.0 in | Wt 273.0 lb

## 2014-09-19 DIAGNOSIS — Z7901 Long term (current) use of anticoagulants: Secondary | ICD-10-CM | POA: Diagnosis not present

## 2014-09-19 DIAGNOSIS — I2699 Other pulmonary embolism without acute cor pulmonale: Secondary | ICD-10-CM

## 2014-09-19 NOTE — Assessment & Plan Note (Signed)
Hemoglobin monitored by primary care.

## 2014-09-19 NOTE — Patient Instructions (Signed)
Your physician wants you to follow-up in: ONE YEAR WITH DR CRENSHAW You will receive a reminder letter in the mail two months in advance. If you don't receive a letter, please call our office to schedule the follow-up appointment.  

## 2014-09-19 NOTE — Assessment & Plan Note (Signed)
Continue Coumadin. Given history of pulmonary embolus 2 he will need lifelong anticoagulation. Hemoglobin monitored by primary care.

## 2014-09-22 ENCOUNTER — Ambulatory Visit (INDEPENDENT_AMBULATORY_CARE_PROVIDER_SITE_OTHER): Payer: Medicare HMO | Admitting: *Deleted

## 2014-09-22 DIAGNOSIS — D682 Hereditary deficiency of other clotting factors: Secondary | ICD-10-CM | POA: Diagnosis not present

## 2014-09-22 DIAGNOSIS — Z5181 Encounter for therapeutic drug level monitoring: Secondary | ICD-10-CM | POA: Diagnosis not present

## 2014-09-22 DIAGNOSIS — Z7901 Long term (current) use of anticoagulants: Secondary | ICD-10-CM

## 2014-09-22 LAB — POCT INR: INR: 2.8

## 2014-10-20 ENCOUNTER — Ambulatory Visit (INDEPENDENT_AMBULATORY_CARE_PROVIDER_SITE_OTHER): Payer: Medicare HMO | Admitting: Pharmacist

## 2014-10-20 DIAGNOSIS — D682 Hereditary deficiency of other clotting factors: Secondary | ICD-10-CM

## 2014-10-20 DIAGNOSIS — Z5181 Encounter for therapeutic drug level monitoring: Secondary | ICD-10-CM

## 2014-10-20 DIAGNOSIS — Z7901 Long term (current) use of anticoagulants: Secondary | ICD-10-CM | POA: Diagnosis not present

## 2014-10-20 LAB — POCT INR: INR: 2.2

## 2014-12-08 ENCOUNTER — Ambulatory Visit (INDEPENDENT_AMBULATORY_CARE_PROVIDER_SITE_OTHER): Payer: Medicare HMO

## 2014-12-08 DIAGNOSIS — Z5181 Encounter for therapeutic drug level monitoring: Secondary | ICD-10-CM

## 2014-12-08 DIAGNOSIS — D682 Hereditary deficiency of other clotting factors: Secondary | ICD-10-CM | POA: Diagnosis not present

## 2014-12-08 DIAGNOSIS — Z7901 Long term (current) use of anticoagulants: Secondary | ICD-10-CM | POA: Diagnosis not present

## 2014-12-08 LAB — POCT INR: INR: 2.8

## 2014-12-11 ENCOUNTER — Other Ambulatory Visit: Payer: Self-pay

## 2015-01-19 ENCOUNTER — Ambulatory Visit (INDEPENDENT_AMBULATORY_CARE_PROVIDER_SITE_OTHER): Payer: Medicare HMO | Admitting: *Deleted

## 2015-01-19 DIAGNOSIS — Z5181 Encounter for therapeutic drug level monitoring: Secondary | ICD-10-CM | POA: Diagnosis not present

## 2015-01-19 DIAGNOSIS — D682 Hereditary deficiency of other clotting factors: Secondary | ICD-10-CM | POA: Diagnosis not present

## 2015-01-19 DIAGNOSIS — Z7901 Long term (current) use of anticoagulants: Secondary | ICD-10-CM

## 2015-01-19 LAB — POCT INR: INR: 2.1

## 2015-03-02 ENCOUNTER — Ambulatory Visit (INDEPENDENT_AMBULATORY_CARE_PROVIDER_SITE_OTHER): Payer: Medicare HMO | Admitting: *Deleted

## 2015-03-02 DIAGNOSIS — Z5181 Encounter for therapeutic drug level monitoring: Secondary | ICD-10-CM

## 2015-03-02 DIAGNOSIS — Z7901 Long term (current) use of anticoagulants: Secondary | ICD-10-CM | POA: Diagnosis not present

## 2015-03-02 DIAGNOSIS — D682 Hereditary deficiency of other clotting factors: Secondary | ICD-10-CM | POA: Diagnosis not present

## 2015-03-02 LAB — POCT INR: INR: 2.2

## 2015-03-20 ENCOUNTER — Encounter: Payer: Self-pay | Admitting: Emergency Medicine

## 2015-04-13 ENCOUNTER — Ambulatory Visit (INDEPENDENT_AMBULATORY_CARE_PROVIDER_SITE_OTHER): Payer: Medicare HMO | Admitting: Pharmacist

## 2015-04-13 DIAGNOSIS — D682 Hereditary deficiency of other clotting factors: Secondary | ICD-10-CM | POA: Diagnosis not present

## 2015-04-13 DIAGNOSIS — Z7901 Long term (current) use of anticoagulants: Secondary | ICD-10-CM | POA: Diagnosis not present

## 2015-04-13 DIAGNOSIS — Z5181 Encounter for therapeutic drug level monitoring: Secondary | ICD-10-CM | POA: Diagnosis not present

## 2015-04-13 LAB — POCT INR: INR: 2.4

## 2015-05-25 ENCOUNTER — Ambulatory Visit (INDEPENDENT_AMBULATORY_CARE_PROVIDER_SITE_OTHER): Payer: Medicare HMO | Admitting: *Deleted

## 2015-05-25 DIAGNOSIS — D682 Hereditary deficiency of other clotting factors: Secondary | ICD-10-CM

## 2015-05-25 DIAGNOSIS — Z5181 Encounter for therapeutic drug level monitoring: Secondary | ICD-10-CM

## 2015-05-25 DIAGNOSIS — Z7901 Long term (current) use of anticoagulants: Secondary | ICD-10-CM | POA: Diagnosis not present

## 2015-05-25 LAB — POCT INR: INR: 3

## 2015-06-12 ENCOUNTER — Encounter: Payer: Self-pay | Admitting: Emergency Medicine

## 2015-06-12 ENCOUNTER — Ambulatory Visit (INDEPENDENT_AMBULATORY_CARE_PROVIDER_SITE_OTHER): Payer: Medicare HMO | Admitting: Emergency Medicine

## 2015-06-12 VITALS — BP 131/72 | HR 96 | Temp 98.0°F | Resp 16 | Ht 70.0 in | Wt 288.6 lb

## 2015-06-12 DIAGNOSIS — N400 Enlarged prostate without lower urinary tract symptoms: Secondary | ICD-10-CM | POA: Diagnosis not present

## 2015-06-12 DIAGNOSIS — Z1159 Encounter for screening for other viral diseases: Secondary | ICD-10-CM | POA: Diagnosis not present

## 2015-06-12 DIAGNOSIS — R635 Abnormal weight gain: Secondary | ICD-10-CM | POA: Diagnosis not present

## 2015-06-12 DIAGNOSIS — Z13220 Encounter for screening for lipoid disorders: Secondary | ICD-10-CM | POA: Diagnosis not present

## 2015-06-12 LAB — POCT GLYCOSYLATED HEMOGLOBIN (HGB A1C): Hemoglobin A1C: 6.2

## 2015-06-12 LAB — COMPREHENSIVE METABOLIC PANEL
ALT: 20 U/L (ref 9–46)
AST: 18 U/L (ref 10–35)
Albumin: 3.8 g/dL (ref 3.6–5.1)
Alkaline Phosphatase: 54 U/L (ref 40–115)
BILIRUBIN TOTAL: 0.4 mg/dL (ref 0.2–1.2)
BUN: 13 mg/dL (ref 7–25)
CALCIUM: 8.7 mg/dL (ref 8.6–10.3)
CO2: 27 mmol/L (ref 20–31)
Chloride: 104 mmol/L (ref 98–110)
Creat: 0.88 mg/dL (ref 0.70–1.25)
Glucose, Bld: 97 mg/dL (ref 65–99)
Potassium: 4.2 mmol/L (ref 3.5–5.3)
Sodium: 138 mmol/L (ref 135–146)
Total Protein: 6.6 g/dL (ref 6.1–8.1)

## 2015-06-12 LAB — CBC WITH DIFFERENTIAL/PLATELET
BASOS PCT: 0 % (ref 0–1)
Basophils Absolute: 0 10*3/uL (ref 0.0–0.1)
EOS PCT: 4 % (ref 0–5)
Eosinophils Absolute: 0.3 10*3/uL (ref 0.0–0.7)
HCT: 43.9 % (ref 39.0–52.0)
Hemoglobin: 14.8 g/dL (ref 13.0–17.0)
Lymphocytes Relative: 35 % (ref 12–46)
Lymphs Abs: 2.3 10*3/uL (ref 0.7–4.0)
MCH: 32.1 pg (ref 26.0–34.0)
MCHC: 33.7 g/dL (ref 30.0–36.0)
MCV: 95.2 fL (ref 78.0–100.0)
MONO ABS: 0.7 10*3/uL (ref 0.1–1.0)
MPV: 10.8 fL (ref 8.6–12.4)
Monocytes Relative: 10 % (ref 3–12)
NEUTROS ABS: 3.4 10*3/uL (ref 1.7–7.7)
Neutrophils Relative %: 51 % (ref 43–77)
Platelets: 187 10*3/uL (ref 150–400)
RBC: 4.61 MIL/uL (ref 4.22–5.81)
RDW: 13.9 % (ref 11.5–15.5)
WBC: 6.6 10*3/uL (ref 4.0–10.5)

## 2015-06-12 LAB — LIPID PANEL
Cholesterol: 205 mg/dL — ABNORMAL HIGH (ref 125–200)
HDL: 46 mg/dL (ref >=40)
LDL CALC: 104 mg/dL (ref <130)
TRIGLYCERIDES: 274 mg/dL — AB (ref <150)
Total CHOL/HDL Ratio: 4.5 Ratio (ref <=5.0)
VLDL: 55 mg/dL — ABNORMAL HIGH (ref <30)

## 2015-06-12 LAB — GLUCOSE, POCT (MANUAL RESULT ENTRY): POC Glucose: 110 mg/dl — AB (ref 70–99)

## 2015-06-12 NOTE — Progress Notes (Signed)
By signing my name below, I, Judithe Modest, attest that this documentation has been prepared under the direction and in the presence of Nena Jordan, MD. Electronic Signed: Judithe Modest, ER Scribe. 06/12/2015. 3:00 PM.  Chief Complaint:  Chief Complaint  Patient presents with  . Office visit   HPI: Stanley Wright is a 69 y.o. male who reports today for a full physical. He states he is doing well, and is happy. He has had a flu shot. He has never had prevnar 13. He was a smoker, but stopped 50 years ago. He has sleep apnea, and uses a cpap. He state she has tried to lose weight and has not been able to so far. 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 He has bilateral edema. His last colonoscopy was in March, 2015.    Past Medical History  Diagnosis Date  . GERD (gastroesophageal reflux disease)   . BENIGN PROSTATIC HYPERTROPHY, WITH OBSTRUCTION   . COLONIC POLYPS   . Factor 5 Leiden mutation, heterozygous ([place])   . Diverticulitis   . HIATAL HERNIA   . Long term current use of anticoagulant   . PSORIASIS   . Pulmonary embolus [facility])    Past Surgical History  Procedure Laterality Date  . Prostate surgery    . Hernia repair    . Lung removal, partial     Social History   Social History  . Marital Status: Divorced    Spouse Name: N/A  . Number of Children: 2  . Years of Education: N/A   Occupational History  .     Social History Main Topics  . Smoking status: Former Research scientist (life sciences)  . Smokeless tobacco: None  . Alcohol Use: No  . Drug Use: No  . Sexual Activity: Yes   Other Topics Concern  . None   Social History Narrative   Family History  Problem Relation Age of Onset  . Cancer Father   . Cancer Sister   . Heart disease Brother   . Cancer Maternal Grandmother   . Stroke Paternal Grandfather    Not on File Prior to Admission medications   Medication Sig Start Date End Date Taking? Authorizing Provider  warfarin (COUMADIN) 5 MG tablet Take as directed by  Coumadin Clinic 07/12/14  Yes Lelon Perla, MD  diphenoxylate-atropine (LOMOTIL) 2.5-0.025 MG per tablet Take 1 tablet by mouth 4 (four) times daily as needed for diarrhea or loose stools. Patient not taking: Reported on 06/12/2015 06/28/13   Darlyne Russian, MD  doxycycline (VIBRAMYCIN) 100 MG capsule Take one tablet daily one day prior to your trip during the entire trip and for one month after you return Patient not taking: Reported on 06/12/2015 06/28/13   Darlyne Russian, MD     ROS: The patient denies fevers, chills, night sweats, unintentional weight loss, chest pain, palpitations, wheezing, dyspnea on exertion, nausea, vomiting, abdominal pain, dysuria, hematuria, melena, numbness, weakness, or tingling.   All other systems have been reviewed and were otherwise negative with the exception of those mentioned in the HPI and as above.    PHYSICAL EXAM: Filed Vitals:   06/12/15 1416  BP: 131/72  Pulse: 96  Temp: 98 F (36.7 C)  Resp: 16   Body mass index is 41.41 kg/(m^2).   General: Alert, no acute distress. Morbidly obese.  HEENT:  Normocephalic, atraumatic, oropharynx patent. Eye: Juliette Mangle [facility] Cardiovascular:  Regular rate and rhythm, no rubs murmurs or gallops.  No Carotid bruits, radial  pulse intact. No pedal edema.  Respiratory: Clear to auscultation bilaterally.  No wheezes, rales, or rhonchi.  No cyanosis, no use of accessory musculature Abdominal: No organomegaly, abdomen is soft and non-tender, positive bowel sounds. 2cm umbilical hernia.  Musculoskeletal: Gait intact. No edema, tenderness Skin: No rashes. Neurologic: Facial musculature symmetric. Psychiatric: Patient acts appropriately throughout our interaction. Lymphatic: No cervical or submandibular lymphadenopathy   LABS:  EKG/XRAY:   Primary read interpreted by Dr. Everlene Farrier at [facility].  ASSESSMENT/PLAN: His main issue continues to be weight. Routine labs were done today. Will check him back in a month and discuss  follow-up at that time.I personally performed the services described in this documentation, which was scribed in my presence. The recorded information has been reviewed and is accurate.  Gross sideeffects, risk and benefits, and alternatives of medications d/w patient. Patient is aware that all medications have potential sideeffects and we are unable to predict every sideeffect or drug-drug interaction that may occur.  [name] MD 06/12/2015 3:00 PM

## 2015-06-13 LAB — PSA, MEDICARE: PSA: 0.73 ng/mL (ref <=4.00)

## 2015-06-13 LAB — HEPATITIS C ANTIBODY: HCV AB: NEGATIVE

## 2015-07-03 ENCOUNTER — Other Ambulatory Visit: Payer: Self-pay | Admitting: Cardiology

## 2015-07-06 ENCOUNTER — Ambulatory Visit (INDEPENDENT_AMBULATORY_CARE_PROVIDER_SITE_OTHER): Payer: Medicare HMO | Admitting: *Deleted

## 2015-07-06 DIAGNOSIS — D682 Hereditary deficiency of other clotting factors: Secondary | ICD-10-CM | POA: Diagnosis not present

## 2015-07-06 DIAGNOSIS — Z5181 Encounter for therapeutic drug level monitoring: Secondary | ICD-10-CM

## 2015-07-06 DIAGNOSIS — Z7901 Long term (current) use of anticoagulants: Secondary | ICD-10-CM | POA: Diagnosis not present

## 2015-07-06 LAB — POCT INR: INR: 3.1

## 2015-07-17 ENCOUNTER — Encounter: Payer: Self-pay | Admitting: Emergency Medicine

## 2015-07-17 ENCOUNTER — Ambulatory Visit (INDEPENDENT_AMBULATORY_CARE_PROVIDER_SITE_OTHER): Payer: Medicare HMO | Admitting: Emergency Medicine

## 2015-07-17 VITALS — BP 127/78 | HR 62 | Temp 98.3°F | Resp 16 | Ht 70.0 in | Wt 274.0 lb

## 2015-07-17 DIAGNOSIS — Z7184 Encounter for health counseling related to travel: Secondary | ICD-10-CM

## 2015-07-17 DIAGNOSIS — Z23 Encounter for immunization: Secondary | ICD-10-CM | POA: Diagnosis not present

## 2015-07-17 DIAGNOSIS — R635 Abnormal weight gain: Secondary | ICD-10-CM | POA: Diagnosis not present

## 2015-07-17 DIAGNOSIS — R739 Hyperglycemia, unspecified: Secondary | ICD-10-CM | POA: Diagnosis not present

## 2015-07-17 DIAGNOSIS — Z0000 Encounter for general adult medical examination without abnormal findings: Secondary | ICD-10-CM | POA: Diagnosis not present

## 2015-07-17 MED ORDER — ZOSTER VACCINE LIVE 19400 UNT/0.65ML ~~LOC~~ SOLR: 0.6500 mL | Freq: Once | SUBCUTANEOUS | Status: DC

## 2015-07-17 MED ORDER — DIPHENOXYLATE-ATROPINE 2.5-0.025 MG PO TABS: 1.0000 | ORAL_TABLET | Freq: Four times a day (QID) | ORAL | Status: DC | PRN

## 2015-07-17 MED ORDER — CIPROFLOXACIN HCL 500 MG PO TABS: 500.0000 mg | ORAL_TABLET | Freq: Two times a day (BID) | ORAL | Status: DC

## 2015-07-17 MED ORDER — DOXYCYCLINE HYCLATE 100 MG PO TABS: 100.0000 mg | ORAL_TABLET | Freq: Two times a day (BID) | ORAL | Status: DC

## 2015-07-17 MED ORDER — METRONIDAZOLE 500 MG PO TABS: 500.0000 mg | ORAL_TABLET | Freq: Two times a day (BID) | ORAL | Status: DC

## 2015-07-17 NOTE — Progress Notes (Signed)
Patient ID: Stanley Wright, male   DOB: Jun 24, 1945, 70 y.o.   MRN: [id number]     By signing my name below, I, Zola Button, attest that this documentation has been prepared under the direction and in the presence of [name], MD.  Electronically Signed: Zola Button, Medical Scribe. 07/17/2015. 12:23 PM.   Chief Complaint:  Chief Complaint  Patient presents with  . Annual Exam    HPI: Stanley Wright is a 70 y.o. male who reports to [facility] today for an annual exam. Patient has lost 14 pounds since his last visit 1 month ago. He uses a CPAP machine, but does not take it with him when he travels on mission trips to [place]. His trips are usually about 10 days; his next trip will be in March (21-29).  Wt Readings from Last 3 Encounters:  07/17/15 274 lb (124.286 kg)  06/12/15 288 lb 9.6 oz (130.908 kg)  09/19/14 273 lb (123.832 kg)    He reports being UTD on colonoscopy. He is due for tetanus.  Past Medical History  Diagnosis Date  . GERD (gastroesophageal reflux disease)   . BENIGN PROSTATIC HYPERTROPHY, WITH OBSTRUCTION   . COLONIC POLYPS   . Factor 5 Leiden mutation, heterozygous ([place])   . Diverticulitis   . HIATAL HERNIA   . Long term current use of anticoagulant   . PSORIASIS   . Pulmonary embolus [facility])    Past Surgical History  Procedure Laterality Date  . Prostate surgery    . Hernia repair    . Lung removal, partial     Social History   Social History  . Marital Status: Divorced    Spouse Name: N/A  . Number of Children: 2  . Years of Education: N/A   Occupational History  .     Social History Main Topics  . Smoking status: Former Research scientist (life sciences)  . Smokeless tobacco: None  . Alcohol Use: No  . Drug Use: No  . Sexual Activity: Yes   Other Topics Concern  . None   Social History Narrative   Family History  Problem Relation Age of Onset  . Cancer Father   . Cancer Sister   . Heart disease Brother   . Cancer Maternal Grandmother   . Stroke Paternal  Grandfather    No Known Allergies Prior to Admission medications   Medication Sig Start Date End Date Taking? Authorizing Provider  warfarin (COUMADIN) 5 MG tablet Take 1 tablet by mouth daily or as directed by coumadin clinic 07/03/15  Yes Lelon Perla, MD  zoster vaccine live, PF, (ZOSTAVAX) 16109 UNT/0.65ML injection Inject 19,400 Units into the skin once. 07/17/15   Darlyne Russian, MD     ROS: The patient denies fevers, chills, night sweats, unintentional weight loss, chest pain, palpitations, wheezing, dyspnea on exertion, nausea, vomiting, abdominal pain, dysuria, hematuria, melena, numbness, weakness, or tingling.  All other systems have been reviewed and were otherwise negative with the exception of those mentioned in the HPI and as above.    PHYSICAL EXAM: Filed Vitals:   07/17/15 1206  BP: 127/78  Pulse: 62  Temp: 98.3 F (36.8 C)  Resp: 16   Body mass index is 39.32 kg/(m^2).   General: Alert, no acute distress. Markedly overweight. HEENT:  Normocephalic, atraumatic, oropharynx patent. Eye: Juliette Mangle [facility] Cardiovascular:  Regular rate and rhythm, no rubs murmurs or gallops.  No Carotid bruits, radial pulse intact. No pedal edema.  Respiratory: Clear to auscultation bilaterally.  No  wheezes, rales, or rhonchi.  No cyanosis, no use of accessory musculature Abdominal: No organomegaly, abdomen is soft and non-tender, positive bowel sounds.  No masses. Musculoskeletal: Gait intact. No edema, tenderness Skin: No rashes. Neurologic: Facial musculature symmetric. Psychiatric: Patient acts appropriately throughout our interaction. Lymphatic: No cervical or submandibular lymphadenopathy    LABS:  Results for orders placed or performed in visit on 07/06/15  POCT INR  Result Value Ref Range   INR 3.1     EKG/XRAY:   Primary read interpreted by Dr. [facility]lene Farrier at [facility].   ASSESSMENT/PLAN: Patient is stable. He was given Prevnar and tetanus shot today. He was given  prescriptions for doxycycline, Flagyl, Cipro, and Lomotil to have for an upcoming trip to [place].I personally performed the services described in this documentation, which was scribed in my presence. The recorded information has been reviewed and is accurate.    Gross sideeffects, risk and benefits, and alternatives of medications d/w patient. Patient is aware that all medications have potential sideeffects and we are unable to predict every sideeffect or drug-drug interaction that may occur.  [name] MD 07/17/2015 12:23 PM

## 2015-07-20 ENCOUNTER — Telehealth: Payer: Self-pay

## 2015-07-20 NOTE — Telephone Encounter (Signed)
Pharm faxed req to verify that Dr Everlene Farrier does want pt to take the cipro, advising that pt is also on warfarin. They want to know whether they should dispense medication or if we want to change to another Abx.

## 2015-07-21 NOTE — Telephone Encounter (Signed)
Call the pharmacy and have them d/c the cipro. Dispence Augmentin [phone] instead to take BID.Call [name] and tell him  we will use augmentin instead of cipro because of less effect on his coumadin.He can call me with Questions. [phone]

## 2015-07-24 NOTE — Telephone Encounter (Signed)
Spoke to patient advised to stop Cipro called in Augmentin.  Patient understood

## 2015-08-17 ENCOUNTER — Ambulatory Visit (INDEPENDENT_AMBULATORY_CARE_PROVIDER_SITE_OTHER): Payer: Medicare HMO | Admitting: *Deleted

## 2015-08-17 DIAGNOSIS — D682 Hereditary deficiency of other clotting factors: Secondary | ICD-10-CM | POA: Diagnosis not present

## 2015-08-17 DIAGNOSIS — Z7901 Long term (current) use of anticoagulants: Secondary | ICD-10-CM

## 2015-08-17 DIAGNOSIS — Z5181 Encounter for therapeutic drug level monitoring: Secondary | ICD-10-CM

## 2015-08-17 LAB — POCT INR: INR: 2.7

## 2015-09-29 NOTE — Progress Notes (Signed)
      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 He is on chronic Coumadin. Abdominal CT 2011 showed no aneurysm. Since last seen, the patient has dyspnea with more extreme activities but not with routine activities. It is relieved with rest. It is not associated with chest pain. There is no orthopnea, PND or pedal edema. There is no syncope or palpitations. There is no exertional chest pain.   Current Outpatient Prescriptions  Medication Sig Dispense Refill  . warfarin (COUMADIN) 5 MG tablet Take 1 tablet by mouth daily or as directed by coumadin clinic 90 tablet 0  . zoster vaccine live, PF, (ZOSTAVAX) 24401 UNT/0.65ML injection Inject 19,400 Units into the skin once. 1 each 0   No current facility-administered medications for this visit.     Past Medical History  Diagnosis Date  . GERD (gastroesophageal reflux disease)   . BENIGN PROSTATIC HYPERTROPHY, WITH OBSTRUCTION   . COLONIC POLYPS   . Factor 5 Leiden mutation, heterozygous ([place])   . Diverticulitis   . HIATAL HERNIA   . Long term current use of anticoagulant   . PSORIASIS   . Pulmonary embolus [facility])     Past Surgical History  Procedure Laterality Date  . Prostate surgery    . Hernia repair    . Lung removal, partial      Social History   Social History  . Marital Status: Divorced    Spouse Name: N/A  . Number of Children: 2  . Years of Education: N/A   Occupational History  .     Social History Main Topics  . Smoking status: Former Research scientist (life sciences)  . Smokeless tobacco: Not on file  . Alcohol Use: No  . Drug Use: No  . Sexual Activity: Yes   Other Topics Concern  . Not on file   Social History Narrative    Family History  Problem Relation Age of Onset  . Cancer Father   . Cancer Sister   . Heart disease Brother   . Cancer Maternal Grandmother   . Stroke Paternal Grandfather     ROS: no fevers or chills, productive cough, hemoptysis, dysphasia, odynophagia, melena,  hematochezia, dysuria, hematuria, rash, seizure activity, orthopnea, PND, pedal edema, claudication. Remaining systems are negative.  Physical Exam: Well-developed obese in no acute distress.  Skin is warm and dry.  HEENT is normal.  Neck is supple.  Chest is clear to auscultation with normal expansion.  Cardiovascular exam is irregular and tachycardic Abdominal exam nontender or distended. No masses palpated. Extremities show trace edema. neuro grossly intact  ECG Atrial fibrillation at a rate of 135. RV conduction delay. Nonspecific ST changes.

## 2015-10-01 ENCOUNTER — Encounter: Payer: Self-pay | Admitting: Cardiology

## 2015-10-01 ENCOUNTER — Ambulatory Visit (INDEPENDENT_AMBULATORY_CARE_PROVIDER_SITE_OTHER): Payer: Medicare HMO | Admitting: Cardiology

## 2015-10-01 DIAGNOSIS — I4891 Unspecified atrial fibrillation: Secondary | ICD-10-CM | POA: Diagnosis not present

## 2015-10-01 MED ORDER — DILTIAZEM HCL ER COATED BEADS 120 MG PO CP24: 120.0000 mg | ORAL_CAPSULE | Freq: Every day | ORAL | Status: DC

## 2015-10-01 NOTE — Assessment & Plan Note (Signed)
Continue Coumadin. 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

## 2015-10-01 NOTE — Assessment & Plan Note (Signed)
Patient with newly diagnosed atrial fibrillation. Check echocardiogram for LV function and TSH. He is on long-term Coumadin for his history of factor V Leiden and recurrent pulmonary emboli. We will continue. Add Cardizem CD 120 mg daily for rate control. I will have him return in the next 48 hours to see one of our extenders to make sure heart rate is controlled. I'm not sure that he is symptomatic although he states he notices some fatigue. We will reassess at future office visit to make decision concerning cardioversion versus rate control/continued anticoagulation. If he is asymptomatic I would favor rate control as he will require long-term anticoagulation regardless.

## 2015-10-01 NOTE — Patient Instructions (Signed)
Medication Instructions:   START DILTIAZEM CD 120 MG ONCE DAILY= 1ST DOSE TONIGHT  Labwork:  Your physician recommends that you return for lab work WITH ECHOCARDIOGRAM  Testing/Procedures:  Your physician has requested that you have an echocardiogram. Echocardiography is a painless test that uses sound waves to create images of your heart. It provides your doctor with information about the size and shape of your heart and how well your heart's chambers and valves are working. This procedure takes approximately one hour. There are no restrictions for this procedure.    Follow-Up:  Your physician recommends that you schedule a follow-up appointment in: ONE TO TWO DAYS WITH APP TO [place]  Your physician recommends that you schedule a follow-up appointment in: [place]

## 2015-10-03 ENCOUNTER — Ambulatory Visit (INDEPENDENT_AMBULATORY_CARE_PROVIDER_SITE_OTHER): Payer: Medicare HMO | Admitting: Cardiology

## 2015-10-03 ENCOUNTER — Encounter: Payer: Self-pay | Admitting: Cardiology

## 2015-10-03 VITALS — BP 122/84 | HR 56 | Ht 72.0 in | Wt 262.0 lb

## 2015-10-03 DIAGNOSIS — I4891 Unspecified atrial fibrillation: Secondary | ICD-10-CM

## 2015-10-03 MED ORDER — DILTIAZEM HCL ER COATED BEADS 120 MG PO CP24: 240.0000 mg | ORAL_CAPSULE | Freq: Every day | ORAL | Status: DC

## 2015-10-03 NOTE — Patient Instructions (Signed)
Medication Instructions:  Your physician recommends that you continue on your current medications as directed. Please refer to the Current Medication list given to you today.   Labwork: None ordered  Testing/Procedures: None ordered  Follow-Up: Follow up as planned with Dr.Crenshaw  May 2017  Any Other Special Instructions Will Be Listed Below (If Applicable).     If you need a refill on your cardiac medications before your next appointment, please call your pharmacy.

## 2015-10-03 NOTE — Progress Notes (Signed)
10/03/2015 Stanley Wright   [birth year]  [id number]  Primary Physician Jenny Reichmann, MD Primary Cardiologist: Dr. Stanford Breed   Reason for Visit/CC: F/u for atrial fibrillation  HPI:  The patient is a 70 year old male, followed by Dr. Stanford Breed, with a history of factor V Leiden and recurrent pulmonary emboli, on chronic anticoagulation with Coumadin which will need to be continued lifelong. Patient was recently seen by Dr. Stanford Breed 2 days ago on April 17 with complaint of dyspnea. EKG revealed newly diagnosed atrial fibrillation. Rate was elevated in the 130s and Dr. Stanford Breed added Cardizem CD 120 mg daily for rate control. He also ordered  a 2-D echocardiogram to assess LV function as well as a TSH. Dr. Stanford Breed recommended that the patient return in 48 hours to reassess heart rate.  EKG today continues to show atrial fibrillation however his heart rate is much improved at 100 bpm. His symptoms have also improved. He is less dyspneic. Blood pressure is 122/84. He reports that he has been tolerating the Cardizem well without any unusual side effects. He denies dizziness, lightheadedness, syncope/near-syncope. Echocardiogram is scheduled for April 24. He is also scheduled to get laboratory work to assess thyroid function at that time. He has been fully compliant with Coumadin.   Current Outpatient Prescriptions  Medication Sig Dispense Refill  . diltiazem (CARDIZEM CD) 120 MG 24 hr capsule Take 1 capsule (120 mg total) by mouth daily. 90 capsule 3  . warfarin (COUMADIN) 5 MG tablet Take 1 tablet by mouth daily or as directed by coumadin clinic 90 tablet 0   No current facility-administered medications for this visit.    No Known Allergies  Social History   Social History  . Marital Status: Divorced    Spouse Name: N/A  . Number of Children: 2  . Years of Education: N/A   Occupational History  .     Social History Main Topics  . Smoking status: Former Research scientist (life sciences)  . Smokeless tobacco:  Not on file  . Alcohol Use: No  . Drug Use: No  . Sexual Activity: Yes   Other Topics Concern  . Not on file   Social History Narrative     Review of Systems: General: negative for chills, fever, night sweats or weight changes.  Cardiovascular: negative for chest pain, dyspnea on exertion, edema, orthopnea, palpitations, paroxysmal nocturnal dyspnea or shortness of breath Dermatological: negative for rash Respiratory: negative for cough or wheezing Urologic: negative for hematuria Abdominal: negative for nausea, vomiting, diarrhea, bright red blood per rectum, melena, or hematemesis Neurologic: negative for visual changes, syncope, or dizziness All other systems reviewed and are otherwise negative except as noted above.    Blood pressure 122/84, pulse 56, height 6' (1.829 m), weight 262 lb (118.842 kg).  General appearance: alert, cooperative and no distress Neck: no carotid bruit and no JVD Lungs: clear to auscultation bilaterally Heart: irregularly irregular rhythm and regular rate Extremities: no LEE Pulses: 2+ and symmetric Skin: warm and dry Neurologic: Grossly normal  EKG atrial fibrillation. VR 100 bpm  ASSESSMENT AND PLAN:   1. Atrial Fibrillation: HR is improved at 100 bpm, compared to 135 bpm 2 days ago. He is tolerating Cardizem well w/o side effects. BP is stable. I've briefly discussed with Dr. Marlou Porch, DOD. He has recommended further increasing Cardizem to 240 mg daily. Patient is to notify us of any side effects or low BP. TSH and 2D echo pending. Continue Coumadin for a/c.   PLAN  Keep 1 month  f/u with Dr. Stanford Breed.   Lyda Jester PA-C 10/03/2015 2:41 PM

## 2015-10-08 ENCOUNTER — Other Ambulatory Visit (INDEPENDENT_AMBULATORY_CARE_PROVIDER_SITE_OTHER): Payer: Medicare HMO | Admitting: *Deleted

## 2015-10-08 ENCOUNTER — Ambulatory Visit (HOSPITAL_COMMUNITY): Payer: Medicare HMO | Attending: Cardiovascular Disease

## 2015-10-08 ENCOUNTER — Other Ambulatory Visit: Payer: Self-pay

## 2015-10-08 DIAGNOSIS — Z6835 Body mass index (BMI) 35.0-35.9, adult: Secondary | ICD-10-CM | POA: Insufficient documentation

## 2015-10-08 DIAGNOSIS — I517 Cardiomegaly: Secondary | ICD-10-CM | POA: Diagnosis not present

## 2015-10-08 DIAGNOSIS — I480 Paroxysmal atrial fibrillation: Secondary | ICD-10-CM | POA: Insufficient documentation

## 2015-10-08 DIAGNOSIS — I4891 Unspecified atrial fibrillation: Secondary | ICD-10-CM

## 2015-10-08 DIAGNOSIS — E669 Obesity, unspecified: Secondary | ICD-10-CM | POA: Insufficient documentation

## 2015-10-08 LAB — TSH: TSH: 2.88 mIU/L (ref 0.40–4.50)

## 2015-10-16 ENCOUNTER — Other Ambulatory Visit: Payer: Self-pay | Admitting: Cardiology

## 2015-10-16 ENCOUNTER — Telehealth: Payer: Self-pay | Admitting: *Deleted

## 2015-10-16 MED ORDER — WARFARIN SODIUM 5 MG PO TABS: ORAL_TABLET | ORAL | Status: DC

## 2015-10-18 NOTE — Progress Notes (Signed)
      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 He is on chronic Coumadin. Abdominal CT 2011 showed no aneurysm. Found to be in atrial fibrillation on recent electrocardiogram. Echocardiogram April 2017 showed mild global reduction in LV function with EF 45-50; moderate left atrial enlargement. TSH 2.88. Patient placed on Cardizem for rate control. Since last seen, He has dyspnea with vigorous activities are not routine activities. No orthopnea, PND, pedal edema, chest pain, syncope or bleeding.  Current Outpatient Prescriptions  Medication Sig Dispense Refill  . diltiazem (CARDIZEM CD) 120 MG 24 hr capsule Take 2 capsules (240 mg total) by mouth daily.    Marland Kitchen warfarin (COUMADIN) 5 MG tablet Take 1 tablet by mouth daily or as directed by coumadin clinic 90 tablet 0   No current facility-administered medications for this visit.     Past Medical History  Diagnosis Date  . GERD (gastroesophageal reflux disease)   . BENIGN PROSTATIC HYPERTROPHY, WITH OBSTRUCTION   . COLONIC POLYPS   . Factor 5 Leiden mutation, heterozygous ([place])   . Diverticulitis   . HIATAL HERNIA   . Long term current use of anticoagulant   . PSORIASIS   . Pulmonary embolus [facility])     Past Surgical History  Procedure Laterality Date  . Prostate surgery    . Hernia repair    . Lung removal, partial      Social History   Social History  . Marital Status: Divorced    Spouse Name: N/A  . Number of Children: 2  . Years of Education: N/A   Occupational History  .     Social History Main Topics  . Smoking status: Former Research scientist (life sciences)  . Smokeless tobacco: Not on file  . Alcohol Use: No  . Drug Use: No  . Sexual Activity: Yes   Other Topics Concern  . Not on file   Social History Narrative    Family History  Problem Relation Age of Onset  . Cancer Father   . Cancer Sister   . Heart disease Brother   . Cancer Maternal Grandmother   . Stroke Paternal  Grandfather     ROS: no fevers or chills, productive cough, hemoptysis, dysphasia, odynophagia, melena, hematochezia, dysuria, hematuria, rash, seizure activity, orthopnea, PND, pedal edema, claudication. Remaining systems are negative.  Physical Exam: Well-developed obese in no acute distress.  Skin is warm and dry.  HEENT is normal.  Neck is supple.  Chest is clear to auscultation with normal expansion.  Cardiovascular exam is irregular Abdominal exam nontender or distended. No masses palpated. Extremities show no edema. neuro grossly intact

## 2015-10-24 ENCOUNTER — Encounter: Payer: Self-pay | Admitting: Cardiology

## 2015-10-24 ENCOUNTER — Ambulatory Visit (INDEPENDENT_AMBULATORY_CARE_PROVIDER_SITE_OTHER): Payer: Medicare HMO | Admitting: *Deleted

## 2015-10-24 ENCOUNTER — Ambulatory Visit (INDEPENDENT_AMBULATORY_CARE_PROVIDER_SITE_OTHER): Payer: Medicare HMO | Admitting: Cardiology

## 2015-10-24 VITALS — BP 121/78 | HR 98 | Ht 72.0 in | Wt 260.1 lb

## 2015-10-24 DIAGNOSIS — Z5181 Encounter for therapeutic drug level monitoring: Secondary | ICD-10-CM

## 2015-10-24 DIAGNOSIS — D682 Hereditary deficiency of other clotting factors: Secondary | ICD-10-CM

## 2015-10-24 DIAGNOSIS — Z7901 Long term (current) use of anticoagulants: Secondary | ICD-10-CM

## 2015-10-24 DIAGNOSIS — I480 Paroxysmal atrial fibrillation: Secondary | ICD-10-CM | POA: Diagnosis not present

## 2015-10-24 LAB — POCT INR: INR: 3

## 2015-10-24 NOTE — Assessment & Plan Note (Signed)
Patient remains in atrial fibrillation. Continue Cardizem for rate control. Continue Coumadin. I discussed options at length today including rate control versus rhythm control. The duration of his atrial fibrillation is unknown. I'm not convinced he is symptomatic from this. However he would like one chance at holding sinus rhythm. We will check his INR. If greater than 2 then he will have been therapeutic for greater than 3 weeks. We will arrange cardioversion. If he does not hold sinus rhythm would favor rate control. He is on long-term Coumadin for factor V Leiden regardless. He will need a Holter monitor to make sure that rate is controlled if he pursue rate control in the future.

## 2015-10-24 NOTE — Patient Instructions (Signed)
Your physician recommends that you schedule a follow-up appointment in: 4 MONTHS WITH DR CRENSHAW  

## 2015-10-24 NOTE — Assessment & Plan Note (Signed)
Continue Coumadin. 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

## 2015-11-06 ENCOUNTER — Ambulatory Visit: Payer: Medicare HMO | Admitting: Cardiology

## 2015-11-19 ENCOUNTER — Telehealth: Payer: Self-pay | Admitting: Cardiology

## 2015-11-19 NOTE — Telephone Encounter (Signed)
Patient called states he was ready to schedule cardioversion  patient aware he will call tomorrow or th next from [place]

## 2015-11-19 NOTE — Telephone Encounter (Signed)
New message     Talk to Stanley Wright regarding being scheduled for a cardioversion

## 2015-11-20 ENCOUNTER — Other Ambulatory Visit: Payer: Self-pay | Admitting: *Deleted

## 2015-11-20 DIAGNOSIS — I4891 Unspecified atrial fibrillation: Secondary | ICD-10-CM

## 2015-11-20 NOTE — Telephone Encounter (Signed)
Spoke with pt, DCCV scheduled for 11-22-15 @ 1pm with dr croitoru. Per dr Stanford Breed the pt was instructed not to take his diltiazem Thursday morning. Instructions discussed in detail with pt over the phone. Patient voiced understanding of instructions.

## 2015-11-21 ENCOUNTER — Ambulatory Visit (INDEPENDENT_AMBULATORY_CARE_PROVIDER_SITE_OTHER): Payer: Medicare HMO | Admitting: Emergency Medicine

## 2015-11-21 ENCOUNTER — Ambulatory Visit (INDEPENDENT_AMBULATORY_CARE_PROVIDER_SITE_OTHER): Payer: Medicare HMO

## 2015-11-21 VITALS — BP 116/78 | HR 85 | Temp 97.9°F | Resp 16 | Ht 72.0 in | Wt 259.2 lb

## 2015-11-21 DIAGNOSIS — Z7189 Other specified counseling: Secondary | ICD-10-CM | POA: Diagnosis not present

## 2015-11-21 DIAGNOSIS — R739 Hyperglycemia, unspecified: Secondary | ICD-10-CM | POA: Diagnosis not present

## 2015-11-21 DIAGNOSIS — K429 Umbilical hernia without obstruction or gangrene: Secondary | ICD-10-CM | POA: Diagnosis not present

## 2015-11-21 DIAGNOSIS — M25551 Pain in right hip: Secondary | ICD-10-CM

## 2015-11-21 DIAGNOSIS — R0602 Shortness of breath: Secondary | ICD-10-CM

## 2015-11-21 DIAGNOSIS — I4819 Other persistent atrial fibrillation: Secondary | ICD-10-CM

## 2015-11-21 DIAGNOSIS — I481 Persistent atrial fibrillation: Secondary | ICD-10-CM

## 2015-11-21 DIAGNOSIS — R635 Abnormal weight gain: Secondary | ICD-10-CM | POA: Diagnosis not present

## 2015-11-21 DIAGNOSIS — M5441 Lumbago with sciatica, right side: Secondary | ICD-10-CM

## 2015-11-21 DIAGNOSIS — Z7184 Encounter for health counseling related to travel: Secondary | ICD-10-CM

## 2015-11-21 LAB — POCT GLYCOSYLATED HEMOGLOBIN (HGB A1C): HEMOGLOBIN A1C: 6.3

## 2015-11-21 LAB — PULMONARY FUNCTION TEST

## 2015-11-21 LAB — POCT CBC
GRANULOCYTE PERCENT: 57.9 % (ref 37–80)
HEMATOCRIT: 45.1 % (ref 43.5–53.7)
HEMOGLOBIN: 15.9 g/dL (ref 14.1–18.1)
Lymph, poc: 1.9 (ref 0.6–3.4)
MCH: 32.3 pg — AB (ref 27–31.2)
MCHC: 35.3 g/dL (ref 31.8–35.4)
MCV: 91.5 fL (ref 80–97)
MID (cbc): 0.5 (ref 0–0.9)
MPV: 7.9 fL (ref 0–99.8)
PLATELET COUNT, POC: 140 10*3/uL — AB (ref 142–424)
POC GRANULOCYTE: 3.4 (ref 2–6.9)
POC LYMPH PERCENT: 32.8 %L (ref 10–50)
POC MID %: 9.3 %M (ref 0–12)
RBC: 4.92 M/uL (ref 4.69–6.13)
RDW, POC: 14.6 %
WBC: 5.9 10*3/uL (ref 4.6–10.2)

## 2015-11-21 LAB — BRAIN NATRIURETIC PEPTIDE: BRAIN NATRIURETIC PEPTIDE: 99.5 pg/mL (ref <100)

## 2015-11-21 NOTE — Patient Instructions (Signed)
     IF you received an x-ray today, you will receive an invoice from [facility]. Please contact [facility] at [phone] with questions or concerns regarding your invoice.   IF you received labwork today, you will receive an invoice from [facility]. Please contact [facility] at [phone] with questions or concerns regarding your invoice.   Our billing staff will not be able to assist you with questions regarding bills from these companies.  You will be contacted with the lab results as soon as they are available. The fastest way to get your results is to activate your My Chart account. Instructions are located on the last page of this paperwork. If you have not heard from us regarding the results in 2 weeks, please contact this office.      

## 2015-11-21 NOTE — Progress Notes (Signed)
By signing my name below, I, Moises Blood, attest that this documentation has been prepared under the direction and in the presence of [name], MD. Electronically Signed: Moises Blood, [place]. 11/21/2015 , 8:31 AM .  Patient was seen in room 10 .  Chief Complaint:  Chief Complaint  Patient presents with  . Hip Pain    right hip pain, on going for the past couple of months    HPI: Stanley Wright is a 70 y.o. male who reports to [facility] today complaining of right hip pain ongoing for the past months.  Patient is having a cardioversion tomorrow with Dr. Sallyanne Kuster. He's taking coumadin and cardizem. He's been traveling and just returned 5 days ago. He notes heavy chest tightness with breathing. He hasn't informed his cardiologist about this tightness. He's a former smoker, quit several years ago.   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 He was moving them by using his buttocks to push them causing right hip pain.   He mentions an umbilical hernia over his navel.   Past Medical History  Diagnosis Date  . GERD (gastroesophageal reflux disease)   . BENIGN PROSTATIC HYPERTROPHY, WITH OBSTRUCTION   . COLONIC POLYPS   . Factor 5 Leiden mutation, heterozygous ([place])   . Diverticulitis   . HIATAL HERNIA   . Long term current use of anticoagulant   . PSORIASIS   . Pulmonary embolus [facility])    Past Surgical History  Procedure Laterality Date  . Prostate surgery    . Hernia repair    . Lung removal, partial     Social History   Social History  . Marital Status: Divorced    Spouse Name: N/A  . Number of Children: 2  . Years of Education: N/A   Occupational History  .     Social History Main Topics  . Smoking status: Former Research scientist (life sciences)  . Smokeless tobacco: None  . Alcohol Use: No  . Drug Use: No  . Sexual Activity: Yes   Other Topics Concern  . None   Social History Narrative   Family History  Problem Relation Age of Onset  . Cancer Father   . Cancer  Sister   . Heart disease Brother   . Cancer Maternal Grandmother   . Stroke Paternal Grandfather    No Known Allergies Prior to Admission medications   Medication Sig Start Date End Date Taking? Authorizing Provider  diltiazem (CARDIZEM CD) 120 MG 24 hr capsule Take 2 capsules (240 mg total) by mouth daily. 10/03/15  Yes Brittainy Erie Noe, PA-C  warfarin (COUMADIN) 5 MG tablet Take 1 tablet by mouth daily or as directed by coumadin clinic 10/16/15  Yes Lelon Perla, MD     ROS:  Constitutional: negative for fever, chills, night sweats, weight changes, or fatigue  HEENT: negative for vision changes, hearing loss, congestion, rhinorrhea, ST, epistaxis, or sinus pressure Cardiovascular: negative for chest pain or palpitations Respiratory: negative for hemoptysis, wheezing, shortness of breath, or cough; positive for chest tightness Abdominal: negative for abdominal pain, nausea, vomiting, diarrhea, or constipation Dermatological: negative for rash Musc: positive for arthralgia (right hip) Neurologic: negative for headache, dizziness, or syncope All other systems reviewed and are otherwise negative with the exception to those above and in the HPI.  PHYSICAL EXAM: Filed Vitals:   11/21/15 0815  BP: 116/78  Pulse: 85  Temp: 97.9 F (36.6 C)  Resp: 16   Body mass index is 35.15 kg/(m^2).  General: Alert, no acute distress HEENT:  Normocephalic, atraumatic, oropharynx patent. Eye: Gaylene Brooks; Overlap upper lids bilaterally Neck: supple.  Cardiovascular:  heart very irregular, no rubs murmurs or gallops.  No Carotid bruits, radial pulse intact. No pedal edema.  Respiratory: Clear to auscultation bilaterally.  No wheezes, rales, or rhonchi.  No cyanosis, no use of accessory musculature Abdominal: No organomegaly, abdomen is soft and non-tender, positive bowel sounds. No masses; obese with umbilical hernia Musculoskeletal: Gait intact. No edema, tenderness lower extremities has  varicose veins with trace edema Skin: No rashes. Neurologic: Facial musculature symmetric. Psychiatric: Patient acts appropriately throughout our interaction.  Lymphatic: No cervical or submandibular lymphadenopathy Genitourinary/Anorectal: No acute findings   LABS: Results for orders placed or performed in visit on 11/21/15  POCT CBC  Result Value Ref Range   WBC 5.9 4.6 - 10.2 K/uL   Lymph, poc 1.9 0.6 - 3.4   POC LYMPH PERCENT 32.8 10 - 50 %L   MID (cbc) 0.5 0 - 0.9   POC MID % 9.3 0 - 12 %M   POC Granulocyte 3.4 2 - 6.9   Granulocyte percent 57.9 37 - 80 %G   RBC 4.92 4.69 - 6.13 M/uL   Hemoglobin 15.9 14.1 - 18.1 g/dL   HCT, POC 45.1 43.5 - 53.7 %   MCV 91.5 80 - 97 fL   MCH, POC 32.3 (A) 27 - 31.2 pg   MCHC 35.3 31.8 - 35.4 g/dL   RDW, POC 14.6 %   Platelet Count, POC 140 (A) 142 - 424 K/uL   MPV 7.9 0 - 99.8 fL  POCT glycosylated hemoglobin (Hb A1C)  Result Value Ref Range   Hemoglobin A1C 6.3      EKG/XRAY:   Dg Chest 2 View  11/21/2015  CLINICAL DATA:  Shortness of breath EXAM: CHEST  2 VIEW COMPARISON:  PA and lateral chest x-ray of January 10, 2010 FINDINGS: The lungs are well-expanded. There is no focal infiltrate. There is apical pleural thickening bilaterally. The cardiac silhouette is mildly enlarged. The pulmonary vascularity is normal. The mediastinum is normal in width. There is mild tortuosity of the descending thoracic aorta. There is multilevel degenerative disc disease of the thoracic spine. IMPRESSION: COPD with mild interstitial prominence, stable. Stable bilateral pleural thickening. Mild cardiomegaly without pulmonary vascular congestion. Electronically Signed   By: David  Martinique M.D.   On: 11/21/2015 09:12   Dg Lumbar Spine 2-3 Views  11/21/2015  CLINICAL DATA:  Midline low back pain with right-sided sciatic symptoms. EXAM: LUMBAR SPINE - 2-3 VIEW COMPARISON:  Coronal and sagittal images through the lumbar spine from an abdominal and pelvic CT scan dated  December 03, 2009 FINDINGS: The lumbar vertebral bodies are preserved in height. There is moderate to severe degenerative disc change at L2-3 and at L5-S1. These findings have progressed since the previous CT scan. There is multilevel moderate facet joint hypertrophy. There is no spondylolisthesis. The pedicles and transverse processes are intact. The observed portions of the sacrum appear normal. IMPRESSION: Moderate to severe degenerative disc disease centered at L2-3 and L5-S1 with multilevel facet joint hypertrophy. There is no compression fracture. Electronically Signed   By: David  Martinique M.D.   On: 11/21/2015 09:15   Dg Hip Unilat W Or W/o Pelvis 2-3 Views Right  11/21/2015  CLINICAL DATA:  Midline low back pain with right-sided sciatic symptoms EXAM: DG HIP (WITH OR WITHOUT PELVIS) 2-3V RIGHT COMPARISON:  None in PACs FINDINGS: The bony pelvis is reasonably  well mineralized for age. There is no lytic or blastic lesion. The observed portions of the sacrum appear normal. The SI joints are unremarkable. AP and lateral views of the right hip reveal preservation of the joint space. The articular surfaces of the femoral head and acetabulum remains smoothly rounded. The femoral neck, intertrochanteric, and subtrochanteric regions appear normal. IMPRESSION: There is no acute or significant chronic bony abnormality of the right hip. Electronically Signed   By: David  Martinique M.D.   On: 11/21/2015 09:13     ASSESSMENT/PLAN:  Patient has 2 level severe degenerative disc disease with multiple levels of arthritis. I suspect his hip pain is more related to his back and a pinched nerve to his right hip. He is not interested in medications. He's going to see his chiropractor first and see if working with chiropractor would alleviate his pain. If he continues to have trouble he will let me know and I will make an orthopedic referral. He shows no signs of COPD on his pulmonary function test. He will follow up with  cardiologist tomorrow regarding his cardioversion if the shortness of breath is persistent we'll make a referral to pulmonary. He does have a umbilical hernia and referral made to Dr. Elinor Parkinson to have this treated.I personally performed the services described in this documentation, which was scribed in my presence. The recorded information has been reviewed and is accurate.  Gross sideeffects, risk and benefits, and alternatives of medications d/w patient. Patient is aware that all medications have potential sideeffects and we are unable to predict every sideeffect or drug-drug interaction that may occur.  [name] MD 11/21/2015 8:17 AM

## 2015-11-22 ENCOUNTER — Ambulatory Visit (HOSPITAL_COMMUNITY): Payer: Medicare HMO | Admitting: Anesthesiology

## 2015-11-22 ENCOUNTER — Encounter (HOSPITAL_COMMUNITY)
Admission: RE | Disposition: A | Discharge status: Home / Self Care | Payer: Self-pay | Source: Ambulatory Visit | Attending: Cardiovascular Disease

## 2015-11-22 ENCOUNTER — Ambulatory Visit (HOSPITAL_COMMUNITY)
Admission: RE | Admit: 2015-11-22 | Discharge: 2015-11-22 | Disposition: A | Discharge status: Home / Self Care | Payer: Medicare HMO | Source: Ambulatory Visit | Attending: Cardiovascular Disease | Admitting: Cardiovascular Disease

## 2015-11-22 ENCOUNTER — Encounter (HOSPITAL_COMMUNITY): Payer: Self-pay | Admitting: *Deleted

## 2015-11-22 DIAGNOSIS — Z7901 Long term (current) use of anticoagulants: Secondary | ICD-10-CM | POA: Diagnosis not present

## 2015-11-22 DIAGNOSIS — I481 Persistent atrial fibrillation: Secondary | ICD-10-CM

## 2015-11-22 DIAGNOSIS — Z6835 Body mass index (BMI) 35.0-35.9, adult: Secondary | ICD-10-CM | POA: Diagnosis not present

## 2015-11-22 DIAGNOSIS — Z86711 Personal history of pulmonary embolism: Secondary | ICD-10-CM | POA: Insufficient documentation

## 2015-11-22 DIAGNOSIS — I4819 Other persistent atrial fibrillation: Secondary | ICD-10-CM | POA: Insufficient documentation

## 2015-11-22 DIAGNOSIS — Z87891 Personal history of nicotine dependence: Secondary | ICD-10-CM | POA: Insufficient documentation

## 2015-11-22 DIAGNOSIS — D6851 Activated protein C resistance: Secondary | ICD-10-CM | POA: Diagnosis not present

## 2015-11-22 DIAGNOSIS — I4891 Unspecified atrial fibrillation: Secondary | ICD-10-CM | POA: Diagnosis present

## 2015-11-22 DIAGNOSIS — E669 Obesity, unspecified: Secondary | ICD-10-CM | POA: Insufficient documentation

## 2015-11-22 DIAGNOSIS — Z79899 Other long term (current) drug therapy: Secondary | ICD-10-CM | POA: Insufficient documentation

## 2015-11-22 HISTORY — PX: CARDIOVERSION: SHX1299

## 2015-11-22 LAB — PROTIME-INR
INR: 2.04 — AB (ref 0.00–1.49)
Prothrombin Time: 22.9 seconds — ABNORMAL HIGH (ref 11.6–15.2)

## 2015-11-22 SURGERY — CARDIOVERSION
Anesthesia: General

## 2015-11-22 MED ORDER — PROPOFOL 10 MG/ML IV BOLUS
INTRAVENOUS | Status: DC | PRN
  Administered 2015-11-22: 70 mg via INTRAVENOUS

## 2015-11-22 MED ORDER — SODIUM CHLORIDE 0.9 % IV SOLN: INTRAVENOUS | Status: DC

## 2015-11-22 MED ORDER — SODIUM CHLORIDE 0.9 % IV SOLN
INTRAVENOUS | Status: DC
  Administered 2015-11-22: 13:00:00 via INTRAVENOUS

## 2015-11-22 MED ORDER — LIDOCAINE HCL (CARDIAC) 20 MG/ML IV SOLN
INTRAVENOUS | Status: DC | PRN
  Administered 2015-11-22: 20 mg via INTRAVENOUS

## 2015-11-22 NOTE — Discharge Instructions (Signed)
Monitored Anesthesia Care °Monitored anesthesia care is an anesthesia service for a medical procedure. Anesthesia is the loss of the ability to feel pain. It is produced by medicines called anesthetics. It may affect a small area of your body (local anesthesia), a large area of your body (regional anesthesia), or your entire body (general anesthesia). The need for monitored anesthesia care depends your procedure, your condition, and the potential need for regional or general anesthesia. It is often provided during procedures where:  °· General anesthesia may be needed if there are complications. This is because you need special care when you are under general anesthesia.   °· You will be under local or regional anesthesia. This is so that you are able to have higher levels of anesthesia if needed.   °· You will receive calming medicines (sedatives). This is especially the case if sedatives are given to put you in a semi-conscious state of relaxation (deep sedation). This is because the amount of sedative needed to produce this state can be hard to predict. Too much of a sedative can produce general anesthesia. °Monitored anesthesia care is performed by one or more health care providers who have special training in all types of anesthesia. You will need to meet with these health care providers before your procedure. During this meeting, they will ask you about your medical history. They will also give you instructions to follow. (For example, you will need to stop eating and drinking before your procedure. You may also need to stop or change medicines you are taking.) During your procedure, your health care providers will stay with you. They will:  °· Watch your condition. This includes watching your blood pressure, breathing, and level of pain.   °· Diagnose and treat problems that occur.   °· Give medicines if they are needed. These may include calming medicines (sedatives) and anesthetics.   °· Make sure you are  comfortable.   °Having monitored anesthesia care does not necessarily mean that you will be under anesthesia. It does mean that your health care providers will be able to manage anesthesia if you need it or if it occurs. It also means that you will be able to have a different type of anesthesia than you are having if you need it. When your procedure is complete, your health care providers will continue to watch your condition. They will make sure any medicines wear off before you are allowed to go home.  °  °This information is not intended to replace advice given to you by your health care provider. Make sure you discuss any questions you have with your health care provider. °  °Document Released: 02/26/2005 Document Revised: 06/23/2014 Document Reviewed: 07/14/2012 °Elsevier Interactive Patient Education ©2016 [employer] °Electrical Cardioversion, Care After °Refer to this sheet in the next few weeks. These instructions provide you with information on caring for yourself after your procedure. Your health care provider may also give you more specific instructions. Your treatment has been planned according to current medical practices, but problems sometimes occur. Call your health care provider if you have any problems or questions after your procedure. °WHAT TO EXPECT AFTER THE PROCEDURE °After your procedure, it is typical to have the following sensations: °· Some redness on the skin where the shocks were delivered. If this is tender, a sunburn lotion or hydrocortisone cream may help. °· Possible return of an abnormal heart rhythm within hours or days after the procedure. °HOME CARE INSTRUCTIONS °· Take medicines only as directed by your health care provider.   Be sure you understand how and when to take your medicine. °· Learn how to feel your pulse and check it often. °· Limit your activity for 48 hours after the procedure or as directed by your health care provider. °· Avoid or minimize caffeine and other  stimulants as directed by your health care provider. °SEEK MEDICAL CARE IF: °· You feel like your heart is beating too fast or your pulse is not regular. °· You have any questions about your medicines. °· You have bleeding that will not stop. °SEEK IMMEDIATE MEDICAL CARE IF: °· You are dizzy or feel faint. °· It is hard to breathe or you feel short of breath. °· There is a change in discomfort in your chest. °· Your speech is slurred or you have trouble moving an arm or leg on one side of your body. °· You get a serious muscle cramp that does not go away. °· Your fingers or toes turn cold or blue. °  °This information is not intended to replace advice given to you by your health care provider. Make sure you discuss any questions you have with your health care provider. °  °Document Released: 03/23/2013 Document Revised: 06/23/2014 Document Reviewed: 03/23/2013 °Elsevier Interactive Patient Education ©2016 [employer] Inc[employer] ° °

## 2015-11-22 NOTE — Interval H&P Note (Signed)
History and Physical Interval Note:  11/22/2015 12:00 PM  Stanley Wright  has presented today for surgery, with the diagnosis of AFIB  The various methods of treatment have been discussed with the patient and family. After consideration of risks, benefits and other options for treatment, the patient has consented to  Procedure(s): CARDIOVERSION (N/A) as a surgical intervention .  The patient's history has been reviewed, patient examined, no change in status, stable for surgery.  I have reviewed the patient's chart and labs.  Questions were answered to the patient's satisfaction.     [name]

## 2015-11-22 NOTE — Anesthesia Preprocedure Evaluation (Addendum)
Anesthesia Evaluation  Patient identified by MRN, date of birth, ID band Patient awake    Reviewed: Allergy & Precautions, NPO status , Patient's Chart, lab work & pertinent test results  Airway Mallampati: II       Dental  (+) Teeth Intact   Pulmonary former smoker,    breath sounds clear to auscultation       Cardiovascular  Rhythm:Irregular Rate:Normal     Neuro/Psych    GI/Hepatic   Endo/Other    Renal/GU      Musculoskeletal   Abdominal   Peds  Hematology   Anesthesia Other Findings   Reproductive/Obstetrics                          Anesthesia Physical Anesthesia Plan  ASA: II  Anesthesia Plan: General   Post-op Pain Management:    Induction: Intravenous  Airway Management Planned: Mask  Additional Equipment:   Intra-op Plan:   Post-operative Plan:   Informed Consent: I have reviewed the patients History and Physical, chart, labs and discussed the procedure including the risks, benefits and alternatives for the proposed anesthesia with the patient or authorized representative who has indicated his/her understanding and acceptance.   Dental advisory given  Plan Discussed with: Anesthesiologist  Anesthesia Plan Comments:         Anesthesia Quick Evaluation

## 2015-11-22 NOTE — H&P (View-Only) (Signed)
By signing my name below, I, Stanley Wright, attest that this documentation has been prepared under the direction and in the presence of [name], MD. Electronically Signed: [name], Rives. 11/21/2015 , 8:31 AM .  Patient was seen in room 10 .  Chief Complaint:  Chief Complaint  Patient presents with  . Hip Pain    right hip pain, on going for the past couple of months    HPI: Stanley Wright is a 70 y.o. male who reports to [facility] today complaining of right hip pain ongoing for the past months.  Patient is having a cardioversion tomorrow with Dr. Sallyanne Kuster. He's taking coumadin and cardizem. He's been traveling and just returned 5 days ago. He notes heavy chest tightness with breathing. He hasn't informed his cardiologist about this tightness. He's a former smoker, quit several years ago.   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 He was moving them by using his buttocks to push them causing right hip pain.   He mentions an umbilical hernia over his navel.   Past Medical History  Diagnosis Date  . GERD (gastroesophageal reflux disease)   . BENIGN PROSTATIC HYPERTROPHY, WITH OBSTRUCTION   . COLONIC POLYPS   . Factor 5 Leiden mutation, heterozygous ([place])   . Diverticulitis   . HIATAL HERNIA   . Long term current use of anticoagulant   . PSORIASIS   . Pulmonary embolus [facility])    Past Surgical History  Procedure Laterality Date  . Prostate surgery    . Hernia repair    . Lung removal, partial     Social History   Social History  . Marital Status: Divorced    Spouse Name: N/A  . Number of Children: 2  . Years of Education: N/A   Occupational History  .     Social History Main Topics  . Smoking status: Former Research scientist (life sciences)  . Smokeless tobacco: None  . Alcohol Use: No  . Drug Use: No  . Sexual Activity: Yes   Other Topics Concern  . None   Social History Narrative   Family History  Problem Relation Age of Onset  . Cancer Father   . Cancer  Sister   . Heart disease Brother   . Cancer Maternal Grandmother   . Stroke Paternal Grandfather    No Known Allergies Prior to Admission medications   Medication Sig Start Date End Date Taking? Authorizing Provider  diltiazem (CARDIZEM CD) 120 MG 24 hr capsule Take 2 capsules (240 mg total) by mouth daily. 10/03/15  Yes Brittainy Erie Noe, PA-C  warfarin (COUMADIN) 5 MG tablet Take 1 tablet by mouth daily or as directed by coumadin clinic 10/16/15  Yes Lelon Perla, MD     ROS:  Constitutional: negative for fever, chills, night sweats, weight changes, or fatigue  HEENT: negative for vision changes, hearing loss, congestion, rhinorrhea, ST, epistaxis, or sinus pressure Cardiovascular: negative for chest pain or palpitations Respiratory: negative for hemoptysis, wheezing, shortness of breath, or cough; positive for chest tightness Abdominal: negative for abdominal pain, nausea, vomiting, diarrhea, or constipation Dermatological: negative for rash Musc: positive for arthralgia (right hip) Neurologic: negative for headache, dizziness, or syncope All other systems reviewed and are otherwise negative with the exception to those above and in the HPI.  PHYSICAL EXAM: Filed Vitals:   11/21/15 0815  BP: 116/78  Pulse: 85  Temp: 97.9 F (36.6 C)  Resp: 16   Body mass index is 35.15 kg/(m^2).  General: Alert, no acute distress HEENT:  Normocephalic, atraumatic, oropharynx patent. Eye: Gaylene Brooks; Overlap upper lids bilaterally Neck: supple.  Cardiovascular:  heart very irregular, no rubs murmurs or gallops.  No Carotid bruits, radial pulse intact. No pedal edema.  Respiratory: Clear to auscultation bilaterally.  No wheezes, rales, or rhonchi.  No cyanosis, no use of accessory musculature Abdominal: No organomegaly, abdomen is soft and non-tender, positive bowel sounds. No masses; obese with umbilical hernia Musculoskeletal: Gait intact. No edema, tenderness lower extremities has  varicose veins with trace edema Skin: No rashes. Neurologic: Facial musculature symmetric. Psychiatric: Patient acts appropriately throughout our interaction.  Lymphatic: No cervical or submandibular lymphadenopathy Genitourinary/Anorectal: No acute findings   LABS: Results for orders placed or performed in visit on 11/21/15  POCT CBC  Result Value Ref Range   WBC 5.9 4.6 - 10.2 K/uL   Lymph, poc 1.9 0.6 - 3.4   POC LYMPH PERCENT 32.8 10 - 50 %L   MID (cbc) 0.5 0 - 0.9   POC MID % 9.3 0 - 12 %M   POC Granulocyte 3.4 2 - 6.9   Granulocyte percent 57.9 37 - 80 %G   RBC 4.92 4.69 - 6.13 M/uL   Hemoglobin 15.9 14.1 - 18.1 g/dL   HCT, POC 45.1 43.5 - 53.7 %   MCV 91.5 80 - 97 fL   MCH, POC 32.3 (A) 27 - 31.2 pg   MCHC 35.3 31.8 - 35.4 g/dL   RDW, POC 14.6 %   Platelet Count, POC 140 (A) 142 - 424 K/uL   MPV 7.9 0 - 99.8 fL  POCT glycosylated hemoglobin (Hb A1C)  Result Value Ref Range   Hemoglobin A1C 6.3      EKG/XRAY:   Dg Chest 2 View  11/21/2015  CLINICAL DATA:  Shortness of breath EXAM: CHEST  2 VIEW COMPARISON:  PA and lateral chest x-ray of January 10, 2010 FINDINGS: The lungs are well-expanded. There is no focal infiltrate. There is apical pleural thickening bilaterally. The cardiac silhouette is mildly enlarged. The pulmonary vascularity is normal. The mediastinum is normal in width. There is mild tortuosity of the descending thoracic aorta. There is multilevel degenerative disc disease of the thoracic spine. IMPRESSION: COPD with mild interstitial prominence, stable. Stable bilateral pleural thickening. Mild cardiomegaly without pulmonary vascular congestion. Electronically Signed   By: David  Martinique M.D.   On: 11/21/2015 09:12   Dg Lumbar Spine 2-3 Views  11/21/2015  CLINICAL DATA:  Midline low back pain with right-sided sciatic symptoms. EXAM: LUMBAR SPINE - 2-3 VIEW COMPARISON:  Coronal and sagittal images through the lumbar spine from an abdominal and pelvic CT scan dated  December 03, 2009 FINDINGS: The lumbar vertebral bodies are preserved in height. There is moderate to severe degenerative disc change at L2-3 and at L5-S1. These findings have progressed since the previous CT scan. There is multilevel moderate facet joint hypertrophy. There is no spondylolisthesis. The pedicles and transverse processes are intact. The observed portions of the sacrum appear normal. IMPRESSION: Moderate to severe degenerative disc disease centered at L2-3 and L5-S1 with multilevel facet joint hypertrophy. There is no compression fracture. Electronically Signed   By: David  Martinique M.D.   On: 11/21/2015 09:15   Dg Hip Unilat W Or W/o Pelvis 2-3 Views Right  11/21/2015  CLINICAL DATA:  Midline low back pain with right-sided sciatic symptoms EXAM: DG HIP (WITH OR WITHOUT PELVIS) 2-3V RIGHT COMPARISON:  None in PACs FINDINGS: The bony pelvis is reasonably  well mineralized for age. There is no lytic or blastic lesion. The observed portions of the sacrum appear normal. The SI joints are unremarkable. AP and lateral views of the right hip reveal preservation of the joint space. The articular surfaces of the femoral head and acetabulum remains smoothly rounded. The femoral neck, intertrochanteric, and subtrochanteric regions appear normal. IMPRESSION: There is no acute or significant chronic bony abnormality of the right hip. Electronically Signed   By: David  Martinique M.D.   On: 11/21/2015 09:13     ASSESSMENT/PLAN:  Patient has 2 level severe degenerative disc disease with multiple levels of arthritis. I suspect his hip pain is more related to his back and a pinched nerve to his right hip. He is not interested in medications. He's going to see his chiropractor first and see if working with chiropractor would alleviate his pain. If he continues to have trouble he will let me know and I will make an orthopedic referral. He shows no signs of COPD on his pulmonary function test. He will follow up with  cardiologist tomorrow regarding his cardioversion if the shortness of breath is persistent we'll make a referral to pulmonary. He does have a umbilical hernia and referral made to Dr. Elinor Parkinson to have this treated.I personally performed the services described in this documentation, which was scribed in my presence. The recorded information has been reviewed and is accurate.  Gross sideeffects, risk and benefits, and alternatives of medications d/w patient. Patient is aware that all medications have potential sideeffects and we are unable to predict every sideeffect or drug-drug interaction that may occur.  [name] MD 11/21/2015 8:17 AM

## 2015-11-22 NOTE — Op Note (Signed)
Procedure: Electrical Cardioversion Indications:  Atrial Fibrillation  Procedure Details:  Consent: Risks of procedure as well as the alternatives and risks of each were explained to the (patient/caregiver).  Consent for procedure obtained.  Time Out: Verified patient identification, verified procedure, site/side was marked, verified correct patient position, special equipment/implants available, medications/allergies/relevent history reviewed, required imaging and test results available.  Performed  Patient placed on cardiac monitor, pulse oximetry, supplemental oxygen as necessary.  Sedation given: IV propofol , Dr. Linna Caprice Pacer pads placed anterior and posterior chest.  Cardioverted 1 time(s).  Cardioversion with synchronized biphasic 150J shock.  Evaluation: Findings: Post procedure EKG shows: NSR Complications: None Patient did tolerate procedure well.  Time Spent Directly with the Patient:  40 minutes   Ashlynd Michna 11/22/2015, 12:56 PM

## 2015-11-22 NOTE — Anesthesia Postprocedure Evaluation (Signed)
Anesthesia Post Note  Patient: Stanley Wright  Procedure(s) Performed: Procedure(s) (LRB): CARDIOVERSION (N/A)  Patient location during evaluation: Endoscopy Anesthesia Type: General Level of consciousness: awake, awake and alert and oriented Pain management: pain level controlled Vital Signs Assessment: post-procedure vital signs reviewed and stable Respiratory status: spontaneous breathing, respiratory function stable and nonlabored ventilation Anesthetic complications: no    Last Vitals:  Filed Vitals:   11/22/15 1315 11/22/15 1320  BP: 111/77 109/74  Pulse: 88 86  Temp:    Resp: 14 22    Last Pain: There were no vitals filed for this visit.               Enez Monahan COKER

## 2015-11-22 NOTE — Anesthesia Procedure Notes (Signed)
Procedure Name: MAC Date/Time: 11/22/2015 12:48 PM Performed by: Kyung Rudd Pre-anesthesia Checklist: Patient identified, Emergency Drugs available, Suction available, Patient being monitored and Timeout performed Patient Re-evaluated:Patient Re-evaluated prior to inductionOxygen Delivery Method: Ambu bag Preoxygenation: Pre-oxygenation with 100% oxygen Intubation Type: IV induction Ventilation: Mask ventilation without difficulty

## 2015-11-22 NOTE — Transfer of Care (Signed)
Immediate Anesthesia Transfer of Care Note  Patient: Revonda Standard  Procedure(s) Performed: Procedure(s): CARDIOVERSION (N/A)  Patient Location: Endoscopy Unit  Anesthesia Type:MAC  Level of Consciousness: awake, alert  and oriented  Airway & Oxygen Therapy: Patient Spontanous Breathing and Patient connected to nasal cannula oxygen  Post-op Assessment: Report given to RN, Post -op Vital signs reviewed and stable and Patient moving all extremities  Post vital signs: Reviewed and stable  Last Vitals:  Filed Vitals:   11/22/15 1147 11/22/15 1302  BP: 132/82   Pulse: 109 90  Temp: 36.8 C   Resp: 21 23    Last Pain: There were no vitals filed for this visit.       Complications: No apparent anesthesia complications

## 2015-11-23 ENCOUNTER — Encounter (HOSPITAL_COMMUNITY): Payer: Self-pay | Admitting: Cardiovascular Disease

## 2015-12-07 ENCOUNTER — Ambulatory Visit (INDEPENDENT_AMBULATORY_CARE_PROVIDER_SITE_OTHER): Payer: Medicare HMO | Admitting: *Deleted

## 2015-12-07 DIAGNOSIS — Z7901 Long term (current) use of anticoagulants: Secondary | ICD-10-CM

## 2015-12-07 DIAGNOSIS — D682 Hereditary deficiency of other clotting factors: Secondary | ICD-10-CM

## 2015-12-07 DIAGNOSIS — Z5181 Encounter for therapeutic drug level monitoring: Secondary | ICD-10-CM

## 2015-12-07 LAB — POCT INR: INR: 2.3

## 2015-12-10 ENCOUNTER — Telehealth: Payer: Self-pay | Admitting: *Deleted

## 2015-12-10 NOTE — Telephone Encounter (Signed)
Clearance is requested for pt to have Laparoscopic umbilical hernia repair under general anesthesia. They are also asking for directions regarding warfarin. Will forward for dr Stanford Breed review

## 2015-12-10 NOTE — Telephone Encounter (Signed)
Pt cannot stop coumadin for full 4 weeks following dccv performed 11/22/15. Then could stop 5 days prior to procedure and resume day after with lovenox bridge Kirk Ruths

## 2015-12-10 NOTE — Telephone Encounter (Signed)
Will fax this note to the number provided. 

## 2015-12-22 NOTE — Addendum Note (Signed)
Addendum  created 12/22/15 1115 by Roberts Gaudy, MD   Modules edited: Anesthesia Attestations

## 2015-12-25 ENCOUNTER — Other Ambulatory Visit: Payer: Self-pay | Admitting: General Surgery

## 2016-01-03 ENCOUNTER — Ambulatory Visit (INDEPENDENT_AMBULATORY_CARE_PROVIDER_SITE_OTHER): Payer: Medicare HMO | Admitting: *Deleted

## 2016-01-03 DIAGNOSIS — D682 Hereditary deficiency of other clotting factors: Secondary | ICD-10-CM

## 2016-01-03 DIAGNOSIS — Z5181 Encounter for therapeutic drug level monitoring: Secondary | ICD-10-CM

## 2016-01-03 DIAGNOSIS — Z7901 Long term (current) use of anticoagulants: Secondary | ICD-10-CM | POA: Diagnosis not present

## 2016-01-03 LAB — POCT INR: INR: 2.7

## 2016-01-17 ENCOUNTER — Telehealth: Payer: Self-pay | Admitting: *Deleted

## 2016-01-17 NOTE — Telephone Encounter (Signed)
Hold coumadin 5 days prior to procedure; resume day of; lovenox bridge. Kirk Ruths

## 2016-01-17 NOTE — Telephone Encounter (Signed)
Will forward this note to the number provided. 

## 2016-01-17 NOTE — Telephone Encounter (Signed)
Pt is needing clearance for bilateral upper eyelid ptosis repair. They are aslo asking for directions regarding warfarin. Will forward for dr Stanford Breed review

## 2016-01-18 ENCOUNTER — Other Ambulatory Visit: Payer: Self-pay | Admitting: Cardiology

## 2016-01-23 ENCOUNTER — Telehealth: Payer: Self-pay | Admitting: Pharmacist

## 2016-01-23 NOTE — Telephone Encounter (Signed)
Pt returned call and states that procedure is actually scheduled for this Friday (2 days from now) He has been taking his coumadin until today.   Called eye surgeon's office and LM that patient has been on Coumadin until today to see how they would like to proceed.   Will have pt remain off coumadin tonight until we hear back from surgeon tomorrow as we would not start lovenox today anyway.

## 2016-01-23 NOTE — Telephone Encounter (Signed)
Morey Hummingbird Nurse with Dr. Katharine Look White's office called to check on status of clearance.   Eye surgery is scheduled for next Friday. Per telephone note on 01/17/16 Dr. Stanford Breed cleared to hold for 5 days with lovenox bridge.   They are wanting [place] to coordinate this for the patient.

## 2016-01-23 NOTE — Telephone Encounter (Signed)
LM to go over lovenox dosing and schedule follow up appt for eye procedure.

## 2016-01-24 NOTE — Telephone Encounter (Signed)
Spoke with Morey Hummingbird at Dr. Orest Dikes.  MD is willing to do surgery tomorrow, ok with patient having held Wed/Thurs doses of warfarin.  They have already spoken with patient, and he is aware that he will restart warfarin Friday evening unless any complications arise during procedure.  Since he only held x 2 days, we will not set up any bridging with Lovenox.

## 2016-01-24 NOTE — Telephone Encounter (Signed)
LM on Morey Hummingbird - nurse with Dr. Dema Severin VM about pt still being on warfarin. Have requested call back to discuss how to proceed since procedure is scheduled for this Friday 01/25/16.

## 2016-01-24 NOTE — Telephone Encounter (Signed)
Returned call to patient about procedure - he has also not received call from office. Instructed him to call to see if he has better luck getting in contact with someone as we have left 2 messages and called 3 times.   He states he understands and will call once he speaks to someone at the [facility] office.

## 2016-02-04 ENCOUNTER — Telehealth: Payer: Self-pay | Admitting: Cardiology

## 2016-02-04 NOTE — Telephone Encounter (Signed)
Stanley Wright is calling to give the surgery Date for his hernia repair 02/25/16 he needs to have a Lovenox bridge..  Thanks

## 2016-02-04 NOTE — Telephone Encounter (Signed)
Will forward to pharm-md. 

## 2016-02-07 ENCOUNTER — Encounter: Payer: Self-pay | Admitting: Cardiology

## 2016-02-14 ENCOUNTER — Ambulatory Visit (INDEPENDENT_AMBULATORY_CARE_PROVIDER_SITE_OTHER): Payer: Medicare HMO | Admitting: *Deleted

## 2016-02-14 DIAGNOSIS — Z5181 Encounter for therapeutic drug level monitoring: Secondary | ICD-10-CM | POA: Diagnosis not present

## 2016-02-14 DIAGNOSIS — Z7901 Long term (current) use of anticoagulants: Secondary | ICD-10-CM | POA: Diagnosis not present

## 2016-02-14 DIAGNOSIS — D682 Hereditary deficiency of other clotting factors: Secondary | ICD-10-CM

## 2016-02-14 LAB — POCT INR: INR: 2.3

## 2016-02-14 MED ORDER — ENOXAPARIN SODIUM 120 MG/0.8ML ~~LOC~~ SOLN: 120.0000 mg | Freq: Two times a day (BID) | SUBCUTANEOUS | 0 refills | Status: DC

## 2016-02-14 NOTE — Patient Instructions (Addendum)
02/19/2016 Last day to take coumadin  02/20/2016 no coumadin no Lovenox 02/21/2016 no coumadin Lovenox 120mg  take 8am and then 12 hours later take Lovenox 120mg  8pm 02/22/2016 no coumadin Lovenox 120mg  take 8am and then 12 hours later take Lovenox 120 mg 8pm 02/23/2016 no coumadin Lovenox 120mg  take 8am and then 12 hours later take Lovenox 120mg  8pm 02/24/2016 no coumadin Lovenox 120mg  take 8am  Do not take lovenox in the pm 02/25/2016 day of surgery no coumadin no lovenox ask MD doing surgery when to restart coumadin and Lovenox and then restart Lovenox at same dose 120mg  at 8am and 120mg  at 8pm and continue coumadin at same dose except take extra 1/2 tablet for 2 days and continue Lovenox and  Coumadin until rechecked in coumadin clinic Sept 15th

## 2016-02-16 NOTE — Progress Notes (Signed)
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 He is on chronic Coumadin. Abdominal CT 2011 showed no aneurysm. Found to be in atrial fibrillation on recent electrocardiogram. Echocardiogram April 2017 showed mild global reduction in LV function with EF 45-50; moderate left atrial enlargement. TSH 2.88. Patient placed on Cardizem for rate control. Had successful DCCV 11/22/15. Since last seen, He notes for the last several days mild increased dyspnea. He denies pedal edema, chest pain, palpitations or syncope. Note he is only taking 120 mg of Cardizem instead of prescribed 240.  Current Outpatient Prescriptions  Medication Sig Dispense Refill  . diltiazem (CARDIZEM CD) 120 MG 24 hr capsule Take 2 capsules (240 mg total) by mouth daily. (Patient taking differently: Take 120 mg by mouth daily. )    . enoxaparin (LOVENOX) 120 MG/0.8ML injection Inject 0.8 mLs (120 mg total) into the skin every 12 (twelve) hours. 20 Syringe 0  . warfarin (COUMADIN) 5 MG tablet TAKE ONE TABLET BY MOUTH ONCE DAILY OR  AS  DIRECTED  BY  COUMADIN  CLINIC (Patient taking differently: TAKE ONE TABLET BY MOUTH ONCE DAILY) 90 tablet 0   No current facility-administered medications for this visit.      Past Medical History:  Diagnosis Date  . Arthritis   . BENIGN PROSTATIC HYPERTROPHY, WITH OBSTRUCTION   . Cancer (HCC)    skin, basal, squamous  . COLONIC POLYPS   . Diverticulitis   . DVT (deep venous thrombosis) ([place]) 2000  . Dysrhythmia    Hx Afib- 2017  . Factor 5 Leiden mutation, heterozygous ([place])   . GERD (gastroesophageal reflux disease)    not on medication  . HIATAL HERNIA   . ITP (idiopathic thrombocytopenic purpura) 2003  . Long term current use of anticoagulant   . PSORIASIS   . Pulmonary embolus ([place]) 2000  . Shortness of breath dyspnea    at times - Talking alot  . Sleep apnea     Past Surgical History:  Procedure Laterality Date  .  CARDIOVERSION N/A 11/22/2015   Procedure: CARDIOVERSION;  Surgeon: Sanda Klein, MD;  Location: [facility] ENDOSCOPY;  Service: Cardiovascular;  Laterality: N/A;  . COLONOSCOPY    . HERNIA REPAIR Left    Inguinal- x2 . mesh x1  . LUNG REMOVAL, PARTIAL Right 2004  . PROSTATE SURGERY      Social History   Social History  . Marital status: Divorced    Spouse name: N/A  . Number of children: 2  . Years of education: N/A   Occupational History  .  Retired   Social History Main Topics  . Smoking status: Former Smoker    Years: 4.00  . Smokeless tobacco: Never Used     Comment: quit approx age 78-   . Alcohol use No  . Drug use: No  . Sexual activity: Yes   Other Topics Concern  . Not on file   Social History Narrative  . No narrative on file    Family History  Problem Relation Age of Onset  . Cancer Father   . Cancer Sister   . Heart disease Brother   . Cancer Maternal Grandmother   . Stroke Paternal Grandfather     ROS: no fevers or chills, productive cough, hemoptysis, dysphasia, odynophagia, melena, hematochezia, dysuria, hematuria, rash, seizure activity, orthopnea, PND, pedal edema, claudication. Remaining systems are negative.  Physical Exam: Well-developed obese in no acute distress.  Skin is warm  and dry.  HEENT is normal.  Neck is supple.  Chest is clear to auscultation with normal expansion.  Cardiovascular exam is irregular and tachycardic Abdominal exam nontender or distended. No masses palpated. Extremities show no edema. neuro grossly intact  ECG-Atrial fibrillation at a rate of 107. No ST changes.  A/P  1 Factor V Leiden-continue Coumadin. 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  2 paroxysmal atrial fibrillation-patient is status post cardioversion. However he has developed recurrent atrial fibrillation. His heart rate is elevated today and he has some dyspnea but he is only taking 120 mg of Cardizem CD.  Increase dose to 240 mg daily. In 1 week placed 24-hour Holter monitor to make sure that rate is adequately controlled. I believe his symptoms will improve once his rate is controlled. I will see him back in 8 weeks. If he is asymptomatic with rate control we will plan rate control and anticoagulation long-term. He requires Coumadin for his factor V Leiden.  3 mild cardiomyopathy-LV function mildly reduced on recent echocardiogram. Question related to atrial fibrillation and tachycardia. Check 24-hour Holter monitor after adjustment of Cardizem. Once rate is adequately controlled we will repeat echocardiogram 3 months later to see if LV function has improved. If not we will proceed with nuclear study to screen for ischemia.  Kirk Ruths, MD

## 2016-02-19 ENCOUNTER — Encounter (HOSPITAL_COMMUNITY)
Admission: RE | Admit: 2016-02-19 | Discharge: 2016-02-19 | Disposition: A | Discharge status: Home / Self Care | Payer: Medicare HMO | Source: Ambulatory Visit | Attending: General Surgery | Admitting: General Surgery

## 2016-02-19 ENCOUNTER — Encounter (HOSPITAL_COMMUNITY): Payer: Self-pay

## 2016-02-19 DIAGNOSIS — Z86718 Personal history of other venous thrombosis and embolism: Secondary | ICD-10-CM | POA: Diagnosis not present

## 2016-02-19 DIAGNOSIS — Z01818 Encounter for other preprocedural examination: Secondary | ICD-10-CM | POA: Insufficient documentation

## 2016-02-19 DIAGNOSIS — Z86711 Personal history of pulmonary embolism: Secondary | ICD-10-CM | POA: Insufficient documentation

## 2016-02-19 DIAGNOSIS — K219 Gastro-esophageal reflux disease without esophagitis: Secondary | ICD-10-CM | POA: Diagnosis not present

## 2016-02-19 DIAGNOSIS — I4891 Unspecified atrial fibrillation: Secondary | ICD-10-CM | POA: Diagnosis not present

## 2016-02-19 DIAGNOSIS — D693 Immune thrombocytopenic purpura: Secondary | ICD-10-CM | POA: Diagnosis not present

## 2016-02-19 DIAGNOSIS — D6851 Activated protein C resistance: Secondary | ICD-10-CM | POA: Insufficient documentation

## 2016-02-19 DIAGNOSIS — Z7901 Long term (current) use of anticoagulants: Secondary | ICD-10-CM | POA: Insufficient documentation

## 2016-02-19 DIAGNOSIS — Z87891 Personal history of nicotine dependence: Secondary | ICD-10-CM | POA: Diagnosis not present

## 2016-02-19 DIAGNOSIS — G4733 Obstructive sleep apnea (adult) (pediatric): Secondary | ICD-10-CM | POA: Diagnosis not present

## 2016-02-19 DIAGNOSIS — Z01812 Encounter for preprocedural laboratory examination: Secondary | ICD-10-CM | POA: Diagnosis not present

## 2016-02-19 DIAGNOSIS — Z79899 Other long term (current) drug therapy: Secondary | ICD-10-CM | POA: Insufficient documentation

## 2016-02-19 DIAGNOSIS — K429 Umbilical hernia without obstruction or gangrene: Secondary | ICD-10-CM | POA: Diagnosis not present

## 2016-02-19 HISTORY — DX: Sleep apnea, unspecified: G47.30

## 2016-02-19 HISTORY — DX: Reserved for inherently not codable concepts without codable children: IMO0001

## 2016-02-19 HISTORY — DX: Acute embolism and thrombosis of unspecified deep veins of unspecified lower extremity: I82.409

## 2016-02-19 HISTORY — DX: Immune thrombocytopenic purpura: D69.3

## 2016-02-19 HISTORY — DX: Cardiac arrhythmia, unspecified: I49.9

## 2016-02-19 HISTORY — DX: Malignant (primary) neoplasm, unspecified: C80.1

## 2016-02-19 HISTORY — DX: Unspecified osteoarthritis, unspecified site: M19.90

## 2016-02-19 LAB — CBC WITH DIFFERENTIAL/PLATELET
BASOS ABS: 0.1 10*3/uL (ref 0.0–0.1)
Basophils Relative: 1 %
Eosinophils Absolute: 0.2 10*3/uL (ref 0.0–0.7)
Eosinophils Relative: 3 %
HCT: 46.2 % (ref 39.0–52.0)
HEMOGLOBIN: 15.4 g/dL (ref 13.0–17.0)
LYMPHS PCT: 36 %
Lymphs Abs: 2.2 10*3/uL (ref 0.7–4.0)
MCH: 32.5 pg (ref 26.0–34.0)
MCHC: 33.3 g/dL (ref 30.0–36.0)
MCV: 97.5 fL (ref 78.0–100.0)
MONOS PCT: 10 %
Monocytes Absolute: 0.6 10*3/uL (ref 0.1–1.0)
Neutro Abs: 3 10*3/uL (ref 1.7–7.7)
Neutrophils Relative %: 50 %
PLATELETS: 169 10*3/uL (ref 150–400)
RBC: 4.74 MIL/uL (ref 4.22–5.81)
RDW: 13.8 % (ref 11.5–15.5)
WBC: 6.1 10*3/uL (ref 4.0–10.5)

## 2016-02-19 LAB — BASIC METABOLIC PANEL
ANION GAP: 6 (ref 5–15)
BUN: 11 mg/dL (ref 6–20)
CO2: 25 mmol/L (ref 22–32)
Calcium: 9.1 mg/dL (ref 8.9–10.3)
Chloride: 108 mmol/L (ref 101–111)
Creatinine, Ser: 0.93 mg/dL (ref 0.61–1.24)
Glucose, Bld: 106 mg/dL — ABNORMAL HIGH (ref 65–99)
POTASSIUM: 4.1 mmol/L (ref 3.5–5.1)
SODIUM: 139 mmol/L (ref 135–145)

## 2016-02-19 NOTE — Progress Notes (Addendum)
Anesthesia Chart Review:  Pt is a 70 year old male scheduled for laparoscopic umbilical hernia repair, insertion of mesh on 02/25/2016 with Rolm Bookbinder, MD.   - Cardiologist is Kirk Ruths, MD who is aware pt to have surgery.  - PCP is [name], MD.   PMH includes:  Atrial fibrillation (s/p cardioversion 11/22/15), OSA, PE, DVT, Factor V Leiden, ITP, GERD. Former smoker. BMI 35  Medications include: diltiazem, lovenox, coumadin. Pt to stop coumadin 02/19/16 and start lovenox 02/21/16.   Preoperative labs reviewed.  PT/INR will be obtained DOS.   CXR 11/21/15: COPD with mild interstitial prominence, stable. Stable bilateral pleural thickening. Mild cardiomegaly without pulmonary vascular congestion.  EKG 11/22/15: Sinus rhythm with 1st degree A-V block. Left axis deviation. Inferior infarct, age undetermined  Echo 10/08/15:  - Left ventricle: Consider repeat EF evaluation when not in rapid afib. The cavity size was mildly dilated. Wall thickness was normal. Systolic function was mildly reduced. The estimated ejection fraction was in the range of 45% to 50%. Diffuse hypokinesis. - Left atrium: The atrium was moderately dilated. - Atrial septum: No defect or patent foramen ovale was identified. --Dr. Stanford Breed comments: "Mildly reduced LV function likely related to tachycardia; will repeat echo in the future when HR controlled"  Willeen Cass, FNP-BC [facility]/Anesthesiology Phone: [phone] 02/19/2016 1:36 PM

## 2016-02-19 NOTE — Progress Notes (Signed)
Mr Stanley Wright denies chest pain, does have some shortness of breath at times, especially when talking a lot or exerting himself. Mr Stanley Wright is stopping Coumadin today and starting Lovenox on September 7. PT/INR will be checked DOS.  Mr Stanley Wright has an appointment with Dr Stanford Breed tomorrow.

## 2016-02-19 NOTE — Pre-Procedure Instructions (Signed)
    Stanley Wright  02/19/2016    Your procedure is scheduled on Monday, September 11.  Report to [facility] Admitting at 8:00 AM               Your surgery or procedure is scheduled for 10:00 AM   Call this number if you have problems the morning of surgery:[phone]               For any other questions, please call [phone], Monday - Friday 8 AM - 4 PM.   Remember:  Do not eat food or drink liquids after midnight Sunday, September 10.  Take these medicines the morning of surgery with A SIP OF WATER:diltiazem (CARDIZEM CD).                 Stop Coumadin and begin Lovenox as instructed by Dr Donne Hazel.   Do not wear jewelry, make-up or nail polish.  Do not wear lotions, powders, or perfumes, or deodorant.  Men may shave face and neck.  Do not bring valuables to the hospital.  [facility] is not responsible for any belongings or valuables.  Contacts, dentures or bridgework may not be worn into surgery.  Leave your suitcase in the car.  After surgery it may be brought to your room.  For patients admitted to the hospital, discharge time will be determined by your treatment team.  Patients discharged the day of surgery will not be allowed to drive home.   Name and phone number of your driver:   - Special instructions: Review  [place] - Preparing For Surgery.  Please read over the following fact sheets that you were given. Review  [place] - Preparing For Surgery, Coughing and Deep Breathing, Pain Management.

## 2016-02-20 ENCOUNTER — Encounter: Payer: Self-pay | Admitting: Cardiology

## 2016-02-20 ENCOUNTER — Ambulatory Visit (INDEPENDENT_AMBULATORY_CARE_PROVIDER_SITE_OTHER): Payer: Medicare HMO | Admitting: Cardiology

## 2016-02-20 VITALS — BP 128/97 | HR 107 | Ht 72.0 in | Wt 256.0 lb

## 2016-02-20 DIAGNOSIS — D682 Hereditary deficiency of other clotting factors: Secondary | ICD-10-CM | POA: Diagnosis not present

## 2016-02-20 DIAGNOSIS — I480 Paroxysmal atrial fibrillation: Secondary | ICD-10-CM | POA: Diagnosis not present

## 2016-02-20 DIAGNOSIS — I4891 Unspecified atrial fibrillation: Secondary | ICD-10-CM

## 2016-02-20 MED ORDER — DILTIAZEM HCL ER COATED BEADS 240 MG PO CP24: 240.0000 mg | ORAL_CAPSULE | Freq: Every day | ORAL | 3 refills | Status: DC

## 2016-02-20 MED ORDER — SPIROMETER KIT
1.0000 | PACK | 1 refills | Status: DC | PRN
Start: 2016-02-20 — End: 2016-04-30

## 2016-02-20 NOTE — Patient Instructions (Signed)
Your physician has recommended you make the following change in your medication:  INCREASE DILTIAZEM  TO [place]  Your physician recommends that you schedule a follow-up appointment in:  [place] has recommended that you wear a holter monitor. Holter monitors are medical devices that record the heart's electrical activity. Doctors most often use these monitors to diagnose arrhythmias. Arrhythmias are problems with the speed or rhythm of the heartbeat. The monitor is a small, portable device. You can wear one while you do your normal daily activities. This is usually used to diagnose what is causing palpitations/syncope (passing out). IN  1  WEEK

## 2016-02-22 MED ORDER — CEFAZOLIN SODIUM-DEXTROSE 2-4 GM/100ML-% IV SOLN
2.0000 g | INTRAVENOUS | Status: AC
  Administered 2016-02-25: 2 g via INTRAVENOUS
  Filled 2016-02-22: qty 100

## 2016-02-25 ENCOUNTER — Observation Stay (HOSPITAL_COMMUNITY)
Admission: RE | Admit: 2016-02-25 | Discharge: 2016-02-26 | Disposition: A | Discharge status: Home / Self Care | Payer: Medicare HMO | Source: Ambulatory Visit | Attending: General Surgery | Admitting: General Surgery

## 2016-02-25 ENCOUNTER — Encounter (HOSPITAL_COMMUNITY)
Admission: RE | Disposition: A | Discharge status: Home / Self Care | Payer: Self-pay | Source: Ambulatory Visit | Attending: General Surgery

## 2016-02-25 ENCOUNTER — Encounter (HOSPITAL_COMMUNITY): Payer: Self-pay | Admitting: *Deleted

## 2016-02-25 ENCOUNTER — Ambulatory Visit (HOSPITAL_COMMUNITY): Payer: Medicare HMO | Admitting: Emergency Medicine

## 2016-02-25 ENCOUNTER — Ambulatory Visit (HOSPITAL_COMMUNITY): Payer: Medicare HMO | Admitting: Certified Registered Nurse Anesthetist

## 2016-02-25 DIAGNOSIS — Z86711 Personal history of pulmonary embolism: Secondary | ICD-10-CM | POA: Insufficient documentation

## 2016-02-25 DIAGNOSIS — E669 Obesity, unspecified: Secondary | ICD-10-CM | POA: Diagnosis not present

## 2016-02-25 DIAGNOSIS — G4730 Sleep apnea, unspecified: Secondary | ICD-10-CM | POA: Insufficient documentation

## 2016-02-25 DIAGNOSIS — I4891 Unspecified atrial fibrillation: Secondary | ICD-10-CM | POA: Diagnosis not present

## 2016-02-25 DIAGNOSIS — Z85820 Personal history of malignant melanoma of skin: Secondary | ICD-10-CM | POA: Diagnosis not present

## 2016-02-25 DIAGNOSIS — Z7901 Long term (current) use of anticoagulants: Secondary | ICD-10-CM | POA: Diagnosis not present

## 2016-02-25 DIAGNOSIS — Z87891 Personal history of nicotine dependence: Secondary | ICD-10-CM | POA: Diagnosis not present

## 2016-02-25 DIAGNOSIS — Z6834 Body mass index (BMI) 34.0-34.9, adult: Secondary | ICD-10-CM | POA: Diagnosis not present

## 2016-02-25 DIAGNOSIS — K429 Umbilical hernia without obstruction or gangrene: Secondary | ICD-10-CM | POA: Diagnosis not present

## 2016-02-25 HISTORY — PX: UMBILICAL HERNIA REPAIR: SHX196

## 2016-02-25 HISTORY — PX: INSERTION OF MESH: SHX5868

## 2016-02-25 LAB — PROTIME-INR
INR: 1.04
Prothrombin Time: 13.7 seconds (ref 11.4–15.2)

## 2016-02-25 SURGERY — REPAIR, HERNIA, UMBILICAL, LAPAROSCOPIC
Anesthesia: General | Site: Abdomen

## 2016-02-25 MED ORDER — DILTIAZEM HCL ER COATED BEADS 240 MG PO CP24
240.0000 mg | ORAL_CAPSULE | Freq: Every day | ORAL | Status: DC
  Administered 2016-02-26: 240 mg via ORAL
  Filled 2016-02-25: qty 1

## 2016-02-25 MED ORDER — MIDAZOLAM HCL 5 MG/5ML IJ SOLN
INTRAMUSCULAR | Status: DC | PRN
  Administered 2016-02-25: 1 mg via INTRAVENOUS

## 2016-02-25 MED ORDER — ONDANSETRON HCL 4 MG/2ML IJ SOLN: 4.0000 mg | Freq: Four times a day (QID) | INTRAMUSCULAR | Status: DC | PRN

## 2016-02-25 MED ORDER — PROPOFOL 10 MG/ML IV BOLUS
INTRAVENOUS | Status: AC
  Filled 2016-02-25: qty 20

## 2016-02-25 MED ORDER — ROCURONIUM BROMIDE 10 MG/ML (PF) SYRINGE
PREFILLED_SYRINGE | INTRAVENOUS | Status: DC | PRN
  Administered 2016-02-25: 50 mg via INTRAVENOUS
  Administered 2016-02-25: 20 mg via INTRAVENOUS

## 2016-02-25 MED ORDER — FENTANYL CITRATE (PF) 100 MCG/2ML IJ SOLN
INTRAMUSCULAR | Status: AC
  Administered 2016-02-25: 50 ug via INTRAVENOUS
  Filled 2016-02-25: qty 2

## 2016-02-25 MED ORDER — ACETAMINOPHEN 650 MG RE SUPP: 650.0000 mg | Freq: Four times a day (QID) | RECTAL | Status: DC | PRN

## 2016-02-25 MED ORDER — MORPHINE SULFATE (PF) 2 MG/ML IV SOLN
2.0000 mg | INTRAVENOUS | Status: DC | PRN
  Administered 2016-02-25: 2 mg via INTRAVENOUS

## 2016-02-25 MED ORDER — PROPOFOL 10 MG/ML IV BOLUS
INTRAVENOUS | Status: DC | PRN
  Administered 2016-02-25: 200 mg via INTRAVENOUS

## 2016-02-25 MED ORDER — PHENYLEPHRINE HCL 10 MG/ML IJ SOLN
INTRAVENOUS | Status: DC | PRN
  Administered 2016-02-25: 25 ug/min via INTRAVENOUS

## 2016-02-25 MED ORDER — SODIUM CHLORIDE 0.9 % IV SOLN: INTRAVENOUS | Status: DC

## 2016-02-25 MED ORDER — WARFARIN - PHYSICIAN DOSING INPATIENT
Freq: Every day | Status: DC
  Administered 2016-02-25: 18:00:00

## 2016-02-25 MED ORDER — BUPIVACAINE-EPINEPHRINE 0.25% -1:200000 IJ SOLN
INTRAMUSCULAR | Status: DC | PRN
  Administered 2016-02-25: 5 mL

## 2016-02-25 MED ORDER — SUGAMMADEX SODIUM 500 MG/5ML IV SOLN
INTRAVENOUS | Status: DC | PRN
  Administered 2016-02-25: 232.2 mg via INTRAVENOUS

## 2016-02-25 MED ORDER — METHOCARBAMOL 500 MG PO TABS
500.0000 mg | ORAL_TABLET | Freq: Four times a day (QID) | ORAL | Status: DC | PRN
  Administered 2016-02-25: 500 mg via ORAL
  Filled 2016-02-25 (×2): qty 1

## 2016-02-25 MED ORDER — WARFARIN SODIUM 5 MG PO TABS
5.0000 mg | ORAL_TABLET | Freq: Every day | ORAL | Status: DC
  Administered 2016-02-25: 5 mg via ORAL
  Filled 2016-02-25: qty 1

## 2016-02-25 MED ORDER — FENTANYL CITRATE (PF) 100 MCG/2ML IJ SOLN
INTRAMUSCULAR | Status: AC
  Filled 2016-02-25: qty 2

## 2016-02-25 MED ORDER — ONDANSETRON HCL 4 MG/2ML IJ SOLN: 4.0000 mg | Freq: Once | INTRAMUSCULAR | Status: DC | PRN

## 2016-02-25 MED ORDER — MORPHINE SULFATE (PF) 2 MG/ML IV SOLN
INTRAVENOUS | Status: AC
  Filled 2016-02-25: qty 1

## 2016-02-25 MED ORDER — FENTANYL CITRATE (PF) 100 MCG/2ML IJ SOLN
25.0000 ug | INTRAMUSCULAR | Status: DC | PRN
  Administered 2016-02-25: 25 ug via INTRAVENOUS
  Administered 2016-02-25: 50 ug via INTRAVENOUS
  Administered 2016-02-25 (×3): 25 ug via INTRAVENOUS

## 2016-02-25 MED ORDER — CHLORHEXIDINE GLUCONATE CLOTH 2 % EX PADS: 6.0000 | MEDICATED_PAD | Freq: Once | CUTANEOUS | Status: DC

## 2016-02-25 MED ORDER — PHENYLEPHRINE 40 MCG/ML (10ML) SYRINGE FOR IV PUSH (FOR BLOOD PRESSURE SUPPORT)
PREFILLED_SYRINGE | INTRAVENOUS | Status: DC | PRN
  Administered 2016-02-25: 80 ug via INTRAVENOUS

## 2016-02-25 MED ORDER — SIMETHICONE 80 MG PO CHEW: 40.0000 mg | CHEWABLE_TABLET | Freq: Four times a day (QID) | ORAL | Status: DC | PRN

## 2016-02-25 MED ORDER — 0.9 % SODIUM CHLORIDE (POUR BTL) OPTIME
TOPICAL | Status: DC | PRN
  Administered 2016-02-25: 1000 mL

## 2016-02-25 MED ORDER — LACTATED RINGERS IV SOLN
INTRAVENOUS | Status: DC
  Administered 2016-02-25 (×3): via INTRAVENOUS

## 2016-02-25 MED ORDER — FENTANYL CITRATE (PF) 100 MCG/2ML IJ SOLN
INTRAMUSCULAR | Status: DC | PRN
  Administered 2016-02-25 (×3): 50 ug via INTRAVENOUS

## 2016-02-25 MED ORDER — OXYCODONE HCL 5 MG PO TABS
ORAL_TABLET | ORAL | Status: AC
  Filled 2016-02-25: qty 2

## 2016-02-25 MED ORDER — OXYCODONE HCL 5 MG PO TABS
5.0000 mg | ORAL_TABLET | ORAL | Status: DC | PRN
  Administered 2016-02-25 – 2016-02-26 (×5): 10 mg via ORAL
  Filled 2016-02-25 (×4): qty 2

## 2016-02-25 MED ORDER — ENOXAPARIN SODIUM 120 MG/0.8ML ~~LOC~~ SOLN
120.0000 mg | Freq: Two times a day (BID) | SUBCUTANEOUS | Status: DC
  Administered 2016-02-25: 120 mg via SUBCUTANEOUS
  Filled 2016-02-25 (×2): qty 0.8

## 2016-02-25 MED ORDER — BUPIVACAINE-EPINEPHRINE (PF) 0.25% -1:200000 IJ SOLN
INTRAMUSCULAR | Status: AC
  Filled 2016-02-25: qty 30

## 2016-02-25 MED ORDER — ONDANSETRON HCL 4 MG/2ML IJ SOLN
INTRAMUSCULAR | Status: DC | PRN
Start: 2016-02-25 — End: 2016-02-25
  Administered 2016-02-25: 4 mg via INTRAVENOUS

## 2016-02-25 MED ORDER — MIDAZOLAM HCL 2 MG/2ML IJ SOLN
INTRAMUSCULAR | Status: AC
  Filled 2016-02-25: qty 2

## 2016-02-25 MED ORDER — ONDANSETRON 4 MG PO TBDP
4.0000 mg | ORAL_TABLET | Freq: Four times a day (QID) | ORAL | Status: DC | PRN
  Filled 2016-02-25: qty 1

## 2016-02-25 MED ORDER — FENTANYL CITRATE (PF) 100 MCG/2ML IJ SOLN
INTRAMUSCULAR | Status: AC
  Filled 2016-02-25: qty 4

## 2016-02-25 MED ORDER — LIDOCAINE 2% (20 MG/ML) 5 ML SYRINGE
INTRAMUSCULAR | Status: DC | PRN
  Administered 2016-02-25: 100 mg via INTRAVENOUS

## 2016-02-25 MED ORDER — ACETAMINOPHEN 325 MG PO TABS: 650.0000 mg | ORAL_TABLET | Freq: Four times a day (QID) | ORAL | Status: DC | PRN

## 2016-02-25 SURGICAL SUPPLY — 53 items
APPLIER CLIP 5 13 M/L LIGAMAX5 (MISCELLANEOUS)
APR CLP MED LRG 5 ANG JAW (MISCELLANEOUS)
CANISTER SUCTION 2500CC (MISCELLANEOUS) ×2 IMPLANT
CHLORAPREP W/TINT 26ML (MISCELLANEOUS) ×3 IMPLANT
CLIP APPLIE 5 13 M/L LIGAMAX5 (MISCELLANEOUS) IMPLANT
CLOSURE STERI-STRIP 1/2X4 (GAUZE/BANDAGES/DRESSINGS) ×2
CLOSURE WOUND 1/2 X4 (GAUZE/BANDAGES/DRESSINGS) ×1
CLSR STERI-STRIP ANTIMIC 1/2X4 (GAUZE/BANDAGES/DRESSINGS) ×2 IMPLANT
COVER SURGICAL LIGHT HANDLE (MISCELLANEOUS) ×3 IMPLANT
DEVICE RELIATACK FIXATION (MISCELLANEOUS) ×5 IMPLANT
DEVICE TROCAR PUNCTURE CLOSURE (ENDOMECHANICALS) ×1 IMPLANT
DRAPE INCISE IOBAN 66X45 STRL (DRAPES) ×3 IMPLANT
ELECT CAUTERY BLADE 6.4 (BLADE) ×2 IMPLANT
ELECT REM PT RETURN 9FT ADLT (ELECTROSURGICAL) ×3
ELECTRODE REM PT RTRN 9FT ADLT (ELECTROSURGICAL) ×1 IMPLANT
GAUZE SPONGE 2X2 8PLY STRL LF (GAUZE/BANDAGES/DRESSINGS) ×1 IMPLANT
GLOVE BIO SURGEON STRL SZ7 (GLOVE) ×3 IMPLANT
GLOVE BIOGEL PI IND STRL 7.5 (GLOVE) IMPLANT
GLOVE BIOGEL PI INDICATOR 7.5 (GLOVE)
GOWN STRL REUS W/ TWL LRG LVL3 (GOWN DISPOSABLE) ×3 IMPLANT
GOWN STRL REUS W/TWL LRG LVL3 (GOWN DISPOSABLE) ×9
KIT BASIN OR (CUSTOM PROCEDURE TRAY) ×3 IMPLANT
KIT ROOM TURNOVER OR (KITS) ×3 IMPLANT
LIQUID BAND (GAUZE/BANDAGES/DRESSINGS) ×5 IMPLANT
MARKER SKIN DUAL TIP RULER LAB (MISCELLANEOUS) ×3 IMPLANT
MESH VENTRALIGHT ST 6IN CRC (Mesh General) ×2 IMPLANT
NDL SPNL 22GX3.5 QUINCKE BK (NEEDLE) ×1 IMPLANT
NEEDLE SPNL 22GX3.5 QUINCKE BK (NEEDLE) ×3 IMPLANT
NS IRRIG 1000ML POUR BTL (IV SOLUTION) ×3 IMPLANT
PAD ARMBOARD 7.5X6 YLW CONV (MISCELLANEOUS) ×3 IMPLANT
PENCIL BUTTON HOLSTER BLD 10FT (ELECTRODE) ×2 IMPLANT
RELOAD ENDO RELIATCK 10 HERNIA (MISCELLANEOUS) IMPLANT
RELOAD ENDO RELIATCK 5 HERNIA (MISCELLANEOUS) IMPLANT
RELOAD RELIATACK 10 (MISCELLANEOUS) IMPLANT
RELOAD RELIATACK 5 (MISCELLANEOUS) ×9 IMPLANT
SCALPEL HARMONIC ACE (MISCELLANEOUS) IMPLANT
SCISSORS LAP 5X35 DISP (ENDOMECHANICALS) ×3 IMPLANT
SET IRRIG TUBING LAPAROSCOPIC (IRRIGATION / IRRIGATOR) IMPLANT
SLEEVE ENDOPATH XCEL 5M (ENDOMECHANICALS) ×6 IMPLANT
SPONGE GAUZE 2X2 STER 10/PKG (GAUZE/BANDAGES/DRESSINGS) ×2
STRIP CLOSURE SKIN 1/2X4 (GAUZE/BANDAGES/DRESSINGS) ×1 IMPLANT
SUT ETHIBOND NAB CT1 #1 30IN (SUTURE) ×2 IMPLANT
SUT MNCRL AB 4-0 PS2 18 (SUTURE) ×5 IMPLANT
SUT PROLENE 0 CT 1 CR/8 (SUTURE) ×3 IMPLANT
SUT VIC AB 3-0 SH 27 (SUTURE) ×3
SUT VIC AB 3-0 SH 27X BRD (SUTURE) IMPLANT
TOWEL OR 17X24 6PK STRL BLUE (TOWEL DISPOSABLE) ×3 IMPLANT
TOWEL OR 17X26 10 PK STRL BLUE (TOWEL DISPOSABLE) ×3 IMPLANT
TRAY FOLEY CATH 14FRSI W/METER (CATHETERS) ×2 IMPLANT
TRAY LAPAROSCOPIC MC (CUSTOM PROCEDURE TRAY) ×3 IMPLANT
TROCAR XCEL NON-BLD 11X100MML (ENDOMECHANICALS) IMPLANT
TROCAR XCEL NON-BLD 5MMX100MML (ENDOMECHANICALS) ×3 IMPLANT
TUBING INSUFFLATION (TUBING) ×3 IMPLANT

## 2016-02-25 NOTE — Interval H&P Note (Signed)
History and Physical Interval Note:  02/25/2016 9:19 AM  Stanley Wright  has presented today for surgery, with the diagnosis of umbilical hernia  The various methods of treatment have been discussed with the patient and family. After consideration of risks, benefits and other options for treatment, the patient has consented to  Procedure(s): [place] (N/A) INSERTION OF MESH (N/A) as a surgical intervention .  The patient's history has been reviewed, patient examined, no change in status, stable for surgery.  I have reviewed the patient's chart and labs.  Questions were answered to the patient's satisfaction.     Estefana Taylor

## 2016-02-25 NOTE — Transfer of Care (Signed)
Immediate Anesthesia Transfer of Care Note  Patient: Stanley Wright  Procedure(s) Performed: Procedure(s): LAPAROSCOPIC UMBILICAL HERNIA REPAIR (N/A) INSERTION OF MESH (N/A)  Patient Location: PACU  Anesthesia Type:General  Level of Consciousness: awake, alert , oriented and patient cooperative  Airway & Oxygen Therapy: Patient Spontanous Breathing and Patient connected to face mask oxygen  Post-op Assessment: Report given to RN and Post -op Vital signs reviewed and stable  Post vital signs: Reviewed and stable  Last Vitals:  Vitals:   02/25/16 0752 02/25/16 1130  BP: (!) 115/92   Pulse: 80   Resp: 20   Temp: 37.2 C 36.6 C    Last Pain:  Vitals:   02/25/16 0752  TempSrc: Oral      Patients Stated Pain Goal: 5 ([id number])  Complications: No apparent anesthesia complications

## 2016-02-25 NOTE — H&P (Signed)
  57 yom who has 1-2 year history of reducible umbilical hernia that mostly bothers him due to its presence. It is mostly out. he does do mission trips. He has history of afib and has recent cardioversion. 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 He is here to discuss repair. seen as consult from Dr Everlene Farrier  Other Problems Atrial Fibrillation Gastroesophageal Reflux Disease Hemorrhoids Inguinal Hernia Melanoma Sleep Apnea Umbilical Hernia Repair  Past Surgical History  Colon Polyp Removal - Colonoscopy Lung Surgery Right. Open Inguinal Hernia Surgery Left. multiple Vasectomy  Diagnostic Studies History  Colonoscopy 1-5 years ago  Allergies No Known Drug Allergies  Medication History  Cartia XT (120MG  Capsule ER 24HR, Oral) Active. Warfarin Sodium (5MG  Tablet, Oral) Active. PriLOSEC OTC (20MG  Tablet DR, Oral) Active. Medications Reconciled  Social History Alcohol use Occasional alcohol use. Caffeine use Coffee, Tea. No drug use Tobacco use Former smoker.  Family History  Breast Cancer Sister. Cancer Father. Heart Disease Brother. Respiratory Condition Father.   Review of Systems  General Present- Weight Loss. Not Present- Appetite Loss, Chills, Fatigue, Fever, Night Sweats and Weight Gain. Skin Not Present- Change in Wart/Mole, Dryness, Hives, Jaundice, New Lesions, Non-Healing Wounds, Rash and Ulcer. HEENT Present- Hearing Loss and Wears glasses/contact lenses. Not Present- Earache, Hoarseness, Nose Bleed, Oral Ulcers, Ringing in the Ears, Seasonal Allergies, Sinus Pain, Sore Throat, Visual Disturbances and Yellow Eyes. Respiratory Not Present- Bloody sputum, Chronic Cough, Difficulty Breathing, Snoring and Wheezing. Breast Not Present- Breast Mass, Breast Pain, Nipple Discharge and Skin Changes. Cardiovascular Not Present- Chest Pain, Difficulty Breathing Lying Down, Leg Cramps, Palpitations, Rapid Heart Rate, Shortness of  Breath and Swelling of Extremities. Gastrointestinal Not Present- Abdominal Pain, Bloating, Bloody Stool, Change in Bowel Habits, Chronic diarrhea, Constipation, Difficulty Swallowing, Excessive gas, Gets full quickly at meals, Hemorrhoids, Indigestion, Nausea, Rectal Pain and Vomiting. Male Genitourinary Not Present- Blood in Urine, Change in Urinary Stream, Frequency, Impotence, Nocturia, Painful Urination, Urgency and Urine Leakage. Musculoskeletal Present- Back Pain and Joint Pain. Not Present- Joint Stiffness, Muscle Pain, Muscle Weakness and Swelling of Extremities. Neurological Not Present- Decreased Memory, Fainting, Headaches, Numbness, Seizures, Tingling, Tremor, Trouble walking and Weakness. Psychiatric Not Present- Anxiety, Bipolar, Change in Sleep Pattern, Depression, Fearful and Frequent crying. Endocrine Not Present- Cold Intolerance, Excessive Hunger, Hair Changes, Heat Intolerance, Hot flashes and New Diabetes. Hematology Present- Blood Thinners. Not Present- Easy Bruising, Excessive bleeding, Gland problems, HIV and Persistent Infections.  Vitals Weight: 253 lb Height: 72in Body Surface Area: 2.35 m Body Mass Index: 34.31 kg/m  Temp.: 2F(Temporal)  Pulse: 83 (Regular)  BP: 128/82 (Sitting, Left Arm, Standard)  Physical Exam  General Mental Status-Alert. Orientation-Oriented X3. Eye Sclera/Conjunctiva - Bilateral-No scleral icterus. Chest and Lung Exam Chest and lung exam reveals -on auscultation, normal breath sounds, no adventitious sounds and normal vocal resonance. Cardiovascular Cardiovascular examination reveals -normal heart sounds, regular rate and rhythm with no murmurs. Abdomen Note: obese soft nt/nd nontender reducible umbo hernia with thinned out skin overlying it   Assessment & Plan  UMBILICAL HERNIA ([id number]) Story: Laparoscopic umbilical hernia repair with primary closure and removal of skin I do this should be prepared as long  as medically reasonable given comorbities. I think with his habitus it would be best repaired with mesh laparoscopically. we discussed this procedure, risks and recovery. I also will close defect and then remove the skin that is thin overlying the hernia.

## 2016-02-25 NOTE — Anesthesia Preprocedure Evaluation (Addendum)
Anesthesia Evaluation  Patient identified by MRN, date of birth, ID band Patient awake    Reviewed: Allergy & Precautions, NPO status , Patient's Chart, lab work & pertinent test results  Airway Mallampati: II  TM Distance: <3 FB Neck ROM: Full    Dental  (+) Teeth Intact, Dental Advisory Given   Pulmonary sleep apnea and Continuous Positive Airway Pressure Ventilation , former smoker, PE   Pulmonary exam normal breath sounds clear to auscultation       Cardiovascular + DVT  + dysrhythmias (Atrial fibrillation (s/p cardioversion 11/22/15)) Atrial Fibrillation  Rhythm:Irregular Rate:Normal  EKG 11/22/15: Sinus rhythm with 1st degree A-V block. Left axis deviation. Inferior infarct, age undetermined  Echo 10/08/15:  - Left ventricle: Consider repeat EF evaluation when not in rapid afib. The cavity size was mildly dilated. Wall thickness was normal. Systolic function was mildly reduced. The estimated ejection fraction was in the range of 45% to 50%. Diffuse hypokinesis. - Left atrium: The atrium was moderately dilated. - Atrial septum: No defect or patent foramen ovale was identified. --Dr. Stanford Breed comments: "Mildly reduced LV function likely related to tachycardia; will repeat echo in the future when HR controlled"   Neuro/Psych negative neurological ROS  negative psych ROS   GI/Hepatic Neg liver ROS, GERD  Medicated,  Endo/Other  Obesity   Renal/GU negative Renal ROS     Musculoskeletal  (+) Arthritis , Osteoarthritis,    Abdominal   Peds  Hematology  (+) Blood dyscrasia (Warfarin), , Factor 5 Leiden mutation, heterozygous ITP (idiopathic thrombocytopenic purpura)   Anesthesia Other Findings Day of surgery medications reviewed with the patient.  Reproductive/Obstetrics                            Anesthesia Physical Anesthesia Plan  ASA: III  Anesthesia Plan: General   Post-op Pain  Management:    Induction: Intravenous  Airway Management Planned: Oral ETT  Additional Equipment:   Intra-op Plan:   Post-operative Plan: Extubation in OR  Informed Consent: I have reviewed the patients History and Physical, chart, labs and discussed the procedure including the risks, benefits and alternatives for the proposed anesthesia with the patient or authorized representative who has indicated his/her understanding and acceptance.   Dental advisory given  Plan Discussed with: CRNA  Anesthesia Plan Comments: (Risks/benefits of general anesthesia discussed with patient including risk of damage to teeth, lips, gum, and tongue, nausea/vomiting, allergic reactions to medications, and the possibility of heart attack, stroke and death.  All patient questions answered.  Patient wishes to proceed.)        Anesthesia Quick Evaluation

## 2016-02-25 NOTE — Discharge Instructions (Signed)
[facility] LAPAROSCOPIC SURGERY: POST OP INSTRUCTIONS  Always review your discharge instruction sheet given to you by the facility where your surgery was performed. IF YOU HAVE DISABILITY OR FAMILY LEAVE FORMS, YOU MUST BRING THEM TO THE OFFICE FOR PROCESSING.   DO NOT GIVE THEM TO YOUR DOCTOR.  1. A prescription for pain medication may be given to you upon discharge.  Take your pain medication as prescribed, if needed.  If narcotic pain medicine is not needed, then you may take acetaminophen (Tylenol), naprosyn (Alleve), or ibuprofen (Advil) as needed. 2. Take your usually prescribed medications unless otherwise directed. 3. If you need a refill on your pain medication, please contact your pharmacy.  They will contact our office to request authorization. Prescriptions will not be filled after 5pm or on week-ends. 4. You should follow a light diet the first few days after arrival home, such as soup and crackers, etc.  Be sure to include lots of fluids daily. 5. Most patients will experience some swelling and bruising in the area of the incisions.  Ice packs will help.  Swelling and bruising can take several days to resolve.  6. It is common to experience some constipation if taking pain medication after surgery.  Increasing fluid intake and taking a stool softener (such as Colace) will usually help or prevent this problem from occurring.  A mild laxative (Milk of Magnesia or Miralax) should be taken according to package instructions if there are no bowel movements after 48 hours. 7. Unless discharge instructions indicate otherwise, you may remove your bandages 48 hours after surgery, and you may shower at that time.  You may have steri-strips (small skin tapes) in place directly over the incision.  These strips should be left on the skin for 7-10 days.  If your surgeon used skin glue on the incision, you may shower in 24 hours.  The glue will flake  off over the next 2-3 weeks.  Any sutures or staples will be removed at the office during your follow-up visit. 8. ACTIVITIES:  You may resume regular (light) daily activities beginning the next day--such as daily self-care, walking, climbing stairs--gradually increasing activities as tolerated.  You may have sexual intercourse when it is comfortable.  Refrain from any heavy lifting or straining until approved by your doctor. a. You may drive when you are no longer taking prescription pain medication, you can comfortably wear a seatbelt, and you can safely maneuver your car and apply brakes. b. RETURN TO WORK:  __________________________________________________________ 9. You should see your doctor in the office for a follow-up appointment approximately 2-3 weeks after your surgery.  Make sure that you call for this appointment within a day or two after you arrive home to insure a convenient appointment time. 10. OTHER INSTRUCTIONS: __________________________________________________________________________________________________________________________ __________________________________________________________________________________________________________________________ WHEN TO CALL YOUR DOCTOR: 1. Fever over 101.0 2. Inability to urinate 3. Continued bleeding from incision. 4. Increased pain, redness, or drainage from the incision. 5. Increasing abdominal pain  The clinic staff is available to answer your questions during regular business hours.  Please don't hesitate to call and ask to speak to one of the nurses for clinical concerns.  If you have a medical emergency, go to the nearest emergency room or call 911.  A surgeon from [facility] is always on call at the hospital. [address], [address], [place], [place]  [address] ? [address] [address], [place], [place]   [address] [phone] ? [phone] ? [phone]   [phone] Web site: [email or web address]  

## 2016-02-25 NOTE — Anesthesia Procedure Notes (Signed)
Procedure Name: Intubation Date/Time: 02/25/2016 10:08 AM Performed by: Everlean Cherry A Pre-anesthesia Checklist: Patient identified, Emergency Drugs available, Suction available and Patient being monitored Patient Re-evaluated:Patient Re-evaluated prior to inductionOxygen Delivery Method: Circle system utilized Preoxygenation: Pre-oxygenation with 100% oxygen Intubation Type: IV induction Ventilation: Mask ventilation without difficulty and Oral airway inserted - appropriate to patient size Laryngoscope Size: Sabra Heck and 2 Grade View: Grade I Tube type: Oral Tube size: 7.5 mm Number of attempts: 1 Airway Equipment and Method: Stylet Placement Confirmation: ETT inserted through vocal cords under direct vision,  positive ETCO2 and breath sounds checked- equal and bilateral Secured at: 24 cm Tube secured with: Tape Dental Injury: Teeth and Oropharynx as per pre-operative assessment

## 2016-02-25 NOTE — Op Note (Signed)
Preoperative diagnosis: Symptomatic umbilical hernia Postoperative diagnosis: Same as above Procedure: Laparoscopic umbilical hernia repair with 15 cm ventralight mesh Surgeon: Dr. Serita Grammes Anesthesia: Gen. Complications: None Estimated blood loss: Minimal Drains: None Specimens: None Sponge and needle count was correct at completion dispostion to recovery in stable condition  Indications: This is a 70 year old male as a symptomatically umbilical hernia. He is on chronic coumadin for previous pulmonary emboli. He is being done through a Lovenox bridge. We discussed options and elected to perform a laparoscopic umbilical hernia prior with mesh associated with excision of some possible hernia sac and excess skin anteriorly.  Procedure: After informed consent was obtained the patient was taken the operating room. His INR was normal. He had taken his Lovenox the night before. He was given antibiotics and sequential compression devices were placed on both legs. He was then placed under general anesthesia without complication. A Foley catheter and orogastric tube placed. He was then prepped and draped in the standard sterile surgical fashion. Surgical timeout was then performed.  I infiltrated Marcaine in his left upper quadrant. I made an incision with an 11 blade. I then inserted a 5 mm Optiview trocar under direct vision without complication. The abdomen was then insufflated to 15 mmHg pressure. I then placed 2 further 5 mm trocars in the left side of the abdomen under direct vision without complication. He had when ended up being about a 1.5 cm hernia with a large associated diastases superiorly. I reduced the contents of the hernia which were his omentum. I then used a 15 cm round ventralight mesh. I placed 4 sutures in the cardinal positions around the mesh. Irolled mesh up and inserted this through a 5 mm site. I then used the suture passer to pull the sutures up to give adequate overlap of  the hernia and spread the mesh flat. These were tied down. I then used a Reliatack device to tack the edges. The mesh was in good position and gave good overlap. Hemostasis was observed. There was no entry injury noted.I then removed my trocars and desufflated the abdomen. I then made an infraumbilical incision and removed some excess hernia sac. I closed the defect with #1 ethibond to separate the mesh from this space. I then tacked the umbilicus down with 3-0 vicryl. I closed incisions with 4-0 monocryl and glue. He tolerated well, was extubated and transferred to recovery stable.

## 2016-02-25 NOTE — OR Nursing (Signed)
oob to chair, pt remains very stable. Continuous monitoring d/c'd.

## 2016-02-26 ENCOUNTER — Encounter (HOSPITAL_COMMUNITY): Payer: Self-pay | Admitting: General Surgery

## 2016-02-26 DIAGNOSIS — K429 Umbilical hernia without obstruction or gangrene: Secondary | ICD-10-CM | POA: Diagnosis not present

## 2016-02-26 MED ORDER — OXYCODONE HCL 5 MG PO TABS: 5.0000 mg | ORAL_TABLET | ORAL | 0 refills | Status: DC | PRN

## 2016-02-26 NOTE — Progress Notes (Signed)
Revonda Standard to be D/C'd Home per MD order.  Discussed with the patient and all questions fully answered.  VSS, Skin clean, dry and intact without evidence of skin break down, no evidence of skin tears noted. IV catheter discontinued intact. Site without signs and symptoms of complications. Dressing and pressure applied.  An After Visit Summary was printed and given to the patient. Patient received prescription.  D/c education completed with patient/family including follow up instructions, medication list, d/c activities limitations if indicated, with other d/c instructions as indicated by MD - patient able to verbalize understanding, all questions fully answered.   Patient instructed to return to ED, call 911, or call MD for any changes in condition.   Patient escorted via [place], and D/C home via private auto.  Jerry Caras 02/26/2016 11:14 AM

## 2016-02-26 NOTE — Discharge Summary (Signed)
Physician Discharge Summary  Patient ID: Stanley Wright MRN: [id number] DOB/AGE: Feb 15, 1946 70 y.o.  Admit date: 02/25/2016 Discharge date: 02/26/2016  Admission Diagnoses: Umbilical hernia Hx pe  Discharge Diagnoses:  Active Problems:   Umbilical hernia   Discharged Condition: good  Hospital Course: 87 yom with cardiac comorbidities and on coumadin for history of pe underwent laparoscopic repair of symptomatic umbilical hernia with ventralight mesh.  He remained overnight due to history.  He was done through lovenox bridge.  He is doing well the following morning. Voiding, ambulating pain controlled and tolerating his diet.  Will dc home today. Already has follow up with coumadin clinic to get inr checked Friday and will remain on lovenox until therapeutic.  Consults: None  Significant Diagnostic Studies: none  Treatments: surgery  Discharge Exam: Blood pressure 97/73, pulse 87, temperature 98.6 F (37 C), temperature source Oral, resp. rate 18, weight 116.1 kg (256 lb), SpO2 94 %. GI: approp tender soft some mild ecchymosis  Disposition: 01-Home or Self Care     Medication List    TAKE these medications   diltiazem 240 MG 24 hr capsule Commonly known as:  CARDIZEM CD Take 1 capsule (240 mg total) by mouth daily.   enoxaparin 120 MG/0.8ML injection Commonly known as:  LOVENOX Inject 0.8 mLs (120 mg total) into the skin every 12 (twelve) hours.   oxyCODONE 5 MG immediate release tablet Commonly known as:  Oxy IR/ROXICODONE Take 1-2 tablets (5-10 mg total) by mouth every 4 (four) hours as needed for moderate pain.   Spirometer Kit 1 applicator by Does not apply route as needed.   warfarin 5 MG tablet Commonly known as:  COUMADIN TAKE ONE TABLET BY MOUTH ONCE DAILY OR  AS  DIRECTED  BY  COUMADIN  CLINIC What changed:  See the new instructions.      Follow-up Information    Owain Eckerman, MD In 3 weeks.   Specialty:  General Surgery Contact  information: [address] [place] [address] [phone]           Signed: Rolm Bookbinder 02/26/2016, 10:52 AM

## 2016-02-26 NOTE — Anesthesia Postprocedure Evaluation (Signed)
Anesthesia Post Note  Patient: Stanley Wright  Procedure(s) Performed: Procedure(s) (LRB): LAPAROSCOPIC UMBILICAL HERNIA REPAIR (N/A) INSERTION OF MESH (N/A)  Patient location during evaluation: PACU Anesthesia Type: General Level of consciousness: awake and alert Pain management: pain level controlled Vital Signs Assessment: post-procedure vital signs reviewed and stable Respiratory status: spontaneous breathing, nonlabored ventilation, respiratory function stable and patient connected to nasal cannula oxygen Cardiovascular status: blood pressure returned to baseline and stable Postop Assessment: no signs of nausea or vomiting Anesthetic complications: no    Last Vitals:  Vitals:   02/26/16 0405 02/26/16 0800  BP: (!) 97/57 97/73  Pulse: 90 87  Resp: 18   Temp: 37.3 C 37 C    Last Pain:  Vitals:   02/26/16 0800  TempSrc: Oral  PainSc:                  Catalina Gravel

## 2016-02-26 NOTE — Care Management Note (Signed)
Case Management Note  Patient Details  Name: Stanley Wright MRN: [id number] Date of Birth: 08-22-1945  Subjective/Objective:   Laparoscopic Repair of umbilical hernia and ventralight mesh                 Action/Plan: Discharge Planning: AVS reviewed: NCM spoke to pt and dtr at home to assist with his care. Pt independent at home. Can afford medications. Requested NCM fax dc summary to PCP. Faxed to PCP's office via Epic.  PCP- Darlyne Russian MD   Expected Discharge Date:  02/26/2016             Expected Discharge Plan:  Home/Self Care  In-House Referral:  NA  Discharge planning Services  CM Consult  Post Acute Care Choice:  NA Choice offered to:  NA  DME Arranged:  N/A DME Agency:  NA  HH Arranged:  NA HH Agency:  NA  Status of Service:  Completed, signed off  If discussed at [place] of Stay Meetings, dates discussed:    Additional Comments:  Erenest Rasher, RN 02/26/2016, 11:32 AM

## 2016-02-26 NOTE — Care Management Obs Status (Signed)
[place] NOTIFICATION   Patient Details  Name: Stanley Wright MRN: [id number] Date of Birth: 1945-12-30   Medicare Observation Status Notification Given:  Yes    Erenest Rasher, RN 02/26/2016, 11:30 AM

## 2016-02-29 ENCOUNTER — Ambulatory Visit (INDEPENDENT_AMBULATORY_CARE_PROVIDER_SITE_OTHER): Payer: Medicare HMO | Admitting: Pharmacist

## 2016-02-29 DIAGNOSIS — Z7901 Long term (current) use of anticoagulants: Secondary | ICD-10-CM

## 2016-02-29 DIAGNOSIS — Z5181 Encounter for therapeutic drug level monitoring: Secondary | ICD-10-CM

## 2016-02-29 DIAGNOSIS — D682 Hereditary deficiency of other clotting factors: Secondary | ICD-10-CM

## 2016-02-29 LAB — POCT INR: INR: 1.5

## 2016-03-03 ENCOUNTER — Encounter: Payer: Self-pay | Admitting: Cardiovascular Disease

## 2016-03-03 ENCOUNTER — Ambulatory Visit (INDEPENDENT_AMBULATORY_CARE_PROVIDER_SITE_OTHER): Payer: Medicare HMO | Admitting: *Deleted

## 2016-03-03 DIAGNOSIS — Z5181 Encounter for therapeutic drug level monitoring: Secondary | ICD-10-CM

## 2016-03-03 DIAGNOSIS — Z7901 Long term (current) use of anticoagulants: Secondary | ICD-10-CM | POA: Diagnosis not present

## 2016-03-03 DIAGNOSIS — D682 Hereditary deficiency of other clotting factors: Secondary | ICD-10-CM | POA: Diagnosis not present

## 2016-03-03 LAB — POCT INR: INR: 2.3

## 2016-03-04 ENCOUNTER — Ambulatory Visit (INDEPENDENT_AMBULATORY_CARE_PROVIDER_SITE_OTHER): Payer: Medicare HMO

## 2016-03-04 DIAGNOSIS — I4891 Unspecified atrial fibrillation: Secondary | ICD-10-CM | POA: Diagnosis not present

## 2016-03-11 ENCOUNTER — Telehealth: Payer: Self-pay | Admitting: *Deleted

## 2016-03-11 MED ORDER — DILTIAZEM HCL ER BEADS 300 MG PO CP24: 300.0000 mg | ORAL_CAPSULE | Freq: Every day | ORAL | 3 refills | Status: DC

## 2016-03-11 NOTE — Telephone Encounter (Signed)
Spoke with pt, aware of medication change. New script sent to the pharmacy  

## 2016-03-11 NOTE — Telephone Encounter (Signed)
-----   Message from Lelon Perla, MD sent at 03/10/2016  5:09 PM EDT ----- Change cardizem to 300 mg daily Kirk Ruths

## 2016-03-11 NOTE — Telephone Encounter (Signed)
Left message for pt to call.

## 2016-04-11 ENCOUNTER — Ambulatory Visit (INDEPENDENT_AMBULATORY_CARE_PROVIDER_SITE_OTHER): Payer: Medicare HMO | Admitting: Pharmacist

## 2016-04-11 DIAGNOSIS — Z5181 Encounter for therapeutic drug level monitoring: Secondary | ICD-10-CM | POA: Diagnosis not present

## 2016-04-11 DIAGNOSIS — D682 Hereditary deficiency of other clotting factors: Secondary | ICD-10-CM | POA: Diagnosis not present

## 2016-04-11 DIAGNOSIS — Z7901 Long term (current) use of anticoagulants: Secondary | ICD-10-CM

## 2016-04-11 LAB — POCT INR: INR: 2.1

## 2016-04-15 ENCOUNTER — Encounter: Payer: Self-pay | Admitting: Cardiology

## 2016-04-23 ENCOUNTER — Ambulatory Visit: Payer: Medicare HMO | Admitting: Cardiology

## 2016-04-24 NOTE — Progress Notes (Signed)
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 He is on chronic Coumadin. Abdominal CT 2011 showed no aneurysm. Found to be in atrial fibrillation on previous electrocardiogram. Echocardiogram April 2017 showed mild global reduction in LV function with EF 45-50; moderate left atrial enlargement. TSH 2.88. Patient placed on Cardizem for rate control. Had successful DCCV 11/22/15. However atrial fibrillation recurred. Holter monitor September 2017 showed atrial fibrillation with PVCs or aberrantly conducted beats. Cardizem increased to 300 mg daily. Since last seen, he denies dyspnea, chest pain, palpitations or syncope. He traveled recently to the [place] and developed pedal edema which is not uncommon when he travels.  Current Outpatient Prescriptions  Medication Sig Dispense Refill  . diltiazem (TIAZAC) 300 MG 24 hr capsule Take 1 capsule (300 mg total) by mouth daily. 90 capsule 3  . warfarin (COUMADIN) 5 MG tablet TAKE ONE TABLET BY MOUTH ONCE DAILY OR  AS  DIRECTED  BY  COUMADIN  CLINIC (Patient taking differently: TAKE ONE TABLET BY MOUTH ONCE DAILY) 90 tablet 0   No current facility-administered medications for this visit.      Past Medical History:  Diagnosis Date  . Arthritis   . BENIGN PROSTATIC HYPERTROPHY, WITH OBSTRUCTION   . Cancer (HCC)    skin, basal, squamous  . COLONIC POLYPS   . Diverticulitis   . DVT (deep venous thrombosis) ([place]) 2000  . Dysrhythmia    Hx Afib- 2017  . Factor 5 Leiden mutation, heterozygous ([place])   . GERD (gastroesophageal reflux disease)    not on medication  . HIATAL HERNIA   . ITP (idiopathic thrombocytopenic purpura) 2003  . Long term current use of anticoagulant   . PSORIASIS   . Pulmonary embolus ([place]) 2000  . Shortness of breath dyspnea    at times - Talking alot  . Sleep apnea     Past Surgical History:  Procedure Laterality Date  . CARDIOVERSION N/A 11/22/2015   Procedure:  CARDIOVERSION;  Surgeon: Sanda Klein, MD;  [facility];  Service: Cardiovascular;  Laterality: N/A;  . COLONOSCOPY    . HERNIA REPAIR Left    Inguinal- x2 . mesh x1  . INSERTION OF MESH N/A 02/25/2016   Procedure: INSERTION OF MESH;  Surgeon: Rolm Bookbinder, MD;  Location: [facility];  Service: General;  Laterality: N/A;  . LUNG REMOVAL, PARTIAL Right 2004  . PROSTATE SURGERY    . UMBILICAL HERNIA REPAIR N/A 02/25/2016   Procedure: LAPAROSCOPIC UMBILICAL HERNIA REPAIR;  Surgeon: Rolm Bookbinder, MD;  Location: [facility];  Service: General;  Laterality: N/A;    Social History   Social History  . Marital status: Divorced    Spouse name: N/A  . Number of children: 2  . Years of education: N/A   Occupational History  .  Retired   Social History Main Topics  . Smoking status: Former Smoker    Years: 4.00  . Smokeless tobacco: Never Used     Comment: quit approx age 61-   . Alcohol use No  . Drug use: No  . Sexual activity: Yes   Other Topics Concern  . Not on file   Social History Narrative  . No narrative on file    Family History  Problem Relation Age of Onset  . Cancer Father   . Cancer Sister   . Heart disease Brother   . Cancer Maternal Grandmother   . Stroke Paternal Grandfather  ROS: no fevers or chills, productive cough, hemoptysis, dysphasia, odynophagia, melena, hematochezia, dysuria, hematuria, rash, seizure activity, orthopnea, PND, pedal edema, claudication. Remaining systems are negative.  Physical Exam: Well-developed obese in no acute distress.  Skin is warm and dry.  HEENT is normal.  Neck is supple.  Chest is clear to auscultation with normal expansion.  Cardiovascular exam is irregular Abdominal exam nontender or distended. No masses palpated. Extremities show 1+ ankle edema. neuro grossly intact  ECG  A/P  1 Factor V Leiden-continue Coumadin. Patient has had recurrent pulmonary emboli and will require lifelong  anticoagulation.  2 persistent atrial fibrillation-patient developed recurrent atrial fibrillation following cardioversion. He remains asymptomatic. I would favor rate control and anticoagulation. Continue Cardizem and coumadin.  3 Cardiomyopathy- mildly reduced LV function on previous study. Will plan repeat echocardiogram in 3 months. Question if previous reduction in LV function related to atrial fibrillation with rapid ventricular response. If LV function remains decreased will plan ischemia evaluation.  Kirk Ruths, MD

## 2016-04-30 ENCOUNTER — Encounter: Payer: Self-pay | Admitting: Cardiology

## 2016-04-30 ENCOUNTER — Ambulatory Visit (INDEPENDENT_AMBULATORY_CARE_PROVIDER_SITE_OTHER): Payer: Medicare HMO | Admitting: Cardiology

## 2016-04-30 VITALS — BP 115/77 | HR 83 | Ht 72.0 in | Wt 259.1 lb

## 2016-04-30 DIAGNOSIS — I429 Cardiomyopathy, unspecified: Secondary | ICD-10-CM

## 2016-04-30 DIAGNOSIS — I4891 Unspecified atrial fibrillation: Secondary | ICD-10-CM

## 2016-04-30 MED ORDER — WARFARIN SODIUM 5 MG PO TABS: 5.0000 mg | ORAL_TABLET | Freq: Every day | ORAL | 3 refills | Status: DC

## 2016-04-30 NOTE — Patient Instructions (Signed)

## 2016-05-23 ENCOUNTER — Ambulatory Visit (INDEPENDENT_AMBULATORY_CARE_PROVIDER_SITE_OTHER): Payer: Medicare HMO | Admitting: *Deleted

## 2016-05-23 DIAGNOSIS — Z5181 Encounter for therapeutic drug level monitoring: Secondary | ICD-10-CM

## 2016-05-23 DIAGNOSIS — Z7901 Long term (current) use of anticoagulants: Secondary | ICD-10-CM | POA: Diagnosis not present

## 2016-05-23 DIAGNOSIS — D682 Hereditary deficiency of other clotting factors: Secondary | ICD-10-CM

## 2016-05-23 LAB — POCT INR: INR: 2.3

## 2016-07-23 ENCOUNTER — Ambulatory Visit (HOSPITAL_BASED_OUTPATIENT_CLINIC_OR_DEPARTMENT_OTHER): Payer: PPO

## 2016-07-29 ENCOUNTER — Ambulatory Visit (INDEPENDENT_AMBULATORY_CARE_PROVIDER_SITE_OTHER): Payer: PPO

## 2016-07-29 ENCOUNTER — Ambulatory Visit (INDEPENDENT_AMBULATORY_CARE_PROVIDER_SITE_OTHER): Payer: PPO | Admitting: Family Medicine

## 2016-07-29 VITALS — BP 118/80 | HR 96 | Temp 98.5°F | Resp 16 | Ht 70.0 in | Wt 259.0 lb

## 2016-07-29 DIAGNOSIS — I482 Chronic atrial fibrillation, unspecified: Secondary | ICD-10-CM

## 2016-07-29 DIAGNOSIS — R0600 Dyspnea, unspecified: Secondary | ICD-10-CM

## 2016-07-29 DIAGNOSIS — J439 Emphysema, unspecified: Secondary | ICD-10-CM | POA: Diagnosis not present

## 2016-07-29 DIAGNOSIS — D6851 Activated protein C resistance: Secondary | ICD-10-CM | POA: Diagnosis not present

## 2016-07-29 DIAGNOSIS — Z7901 Long term (current) use of anticoagulants: Secondary | ICD-10-CM

## 2016-07-29 LAB — POCT CBC
GRANULOCYTE PERCENT: 57.4 % (ref 37–80)
HCT, POC: 45.1 % (ref 43.5–53.7)
Hemoglobin: 16.2 g/dL (ref 14.1–18.1)
Lymph, poc: 1.6 (ref 0.6–3.4)
MCH: 33.7 pg — AB (ref 27–31.2)
MCHC: 35.9 g/dL — AB (ref 31.8–35.4)
MCV: 93.9 fL (ref 80–97)
MID (cbc): 0.3 (ref 0–0.9)
MPV: 7.9 fL (ref 0–99.8)
PLATELET COUNT, POC: 105 10*3/uL — AB (ref 142–424)
POC Granulocyte: 2.5 (ref 2–6.9)
POC LYMPH %: 36.8 % (ref 10–50)
POC MID %: 5.8 %M (ref 0–12)
RBC: 4.8 M/uL (ref 4.69–6.13)
RDW, POC: 13.7 %
WBC: 4.4 10*3/uL — AB (ref 4.6–10.2)

## 2016-07-29 NOTE — Progress Notes (Signed)
By signing my name below, I, Mesha Guinyard, attest that this documentation has been prepared under the direction and in the presence of Merri Ray, MD.  Electronically Signed: Verlee Monte, Medical Scribe. 07/29/16. 8:32 AM.  Subjective:    Patient ID: Stanley Wright, male    DOB: 1946-04-03, 71 y.o.   MRN: [id number]  HPI Chief Complaint  Patient presents with  . Shortness of Breath    Pt. says trouble breathing for several months     HPI Comments: Stanley Wright is a 71 y.o. male with a PMHx of atrial fibrillation, GERD, and BPH who presents to the [facility] complaining of dyspnea onset months. He take diltiazem and coumadin for his A. Fib. Was last seen by cardiologist Nov 15th, 2017. Does have some CHF with EF 45-50% on echo in April 2017. DCCV on June 8th 2017, but than A. Fib reoccurred. Planned for repeat echocardiogram at that visit to determine if low EF was due to A. Fib, or if persistently low may need ischemia evaluation. It does not appear he's had the echocardiogram yet. Previous cardiology note reviewed. Planning on echo tomorrow.  Pt has had intermittent non painful, non radiating SOB starting 1.5 years ago. 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 Nonexertional. Pt suspects he's having worsening SOB from getting over the flu last week. He had fever, congestion, dry cough last week; his fever has resolved but his cough and congestion still lingers. He suspects he's getting better and he suspects he's breaking up the chest congestion. Pt had an appt with his cardiologist for an echo only tomorrow. Denies chest pain, chest tightness, weakness, light-headedness, and dizziness.  Recurrent Pulmonary Emboli with Factor V Leiden: He's on life long anticoagulation. Last INR was 2.3 05/23/16. Last Coumadin check was rescheduled due to weather.  Last pulmonary embolism was in 2008 and he was on blood thinners at that time. Has been on coumadin since 2000,  but he was under treated at time of pulmonary embolism in 2008. Pt has chronic calf swelling, but it's not worsening. Pt is no longer a smoker but when he did it was 40 years long. Denies PMHx of asthma. Deneis calf pain.  Patient Active Problem List   Diagnosis Date Noted  . Umbilical hernia [id number]  . Persistent atrial fibrillation ([place])   . Atrial fibrillation ([place]) 10/01/2015  . Encounter for therapeutic drug monitoring 08/12/2013  . Long term current use of anticoagulant 07/30/2010  . COLONIC POLYPS 06/03/2010  . 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 GERD 06/03/2010  . HIATAL HERNIA 06/03/2010  . BENIGN PROSTATIC HYPERTROPHY, WITH OBSTRUCTION 06/03/2010  . PSORIASIS 06/03/2010   Past Medical History:  Diagnosis Date  . Arthritis   . BENIGN PROSTATIC HYPERTROPHY, WITH OBSTRUCTION   . Cancer (HCC)    skin, basal, squamous  . COLONIC POLYPS   . Diverticulitis   . DVT (deep venous thrombosis) ([place]) 2000  . Dysrhythmia    Hx Afib- 2017  . Factor 5 Leiden mutation, heterozygous ([place])   . GERD (gastroesophageal reflux disease)    not on medication  . HIATAL HERNIA   . ITP (idiopathic thrombocytopenic purpura) 2003  . Long term current use of anticoagulant   . PSORIASIS   . Pulmonary embolus ([place]) 2000  . Shortness of breath dyspnea    at times - Talking alot  . Sleep apnea    Past Surgical History:  Procedure Laterality Date  . CARDIOVERSION N/A  11/22/2015   Procedure: CARDIOVERSION;  Surgeon: Sanda Klein, MD;  [facility];  Service: Cardiovascular;  Laterality: N/A;  . COLONOSCOPY    . HERNIA REPAIR Left    Inguinal- x2 . mesh x1  . INSERTION OF MESH N/A 02/25/2016   Procedure: INSERTION OF MESH;  Surgeon: Rolm Bookbinder, MD;  Location: [facility];  Service: General;  Laterality: N/A;  . LUNG REMOVAL, PARTIAL Right 2004  . PROSTATE SURGERY    . UMBILICAL HERNIA REPAIR N/A 02/25/2016   Procedure: LAPAROSCOPIC UMBILICAL HERNIA REPAIR;  Surgeon: Rolm Bookbinder, MD;  Location: [facility];  Service: General;  Laterality: N/A;   Allergies  Allergen Reactions  . No Known Allergies    Prior to Admission medications   Medication Sig Start Date End Date Taking? Authorizing Provider  diltiazem (TIAZAC) 300 MG 24 hr capsule Take 1 capsule (300 mg total) by mouth daily. 03/11/16   Lelon Perla, MD  warfarin (COUMADIN) 5 MG tablet Take 1 tablet (5 mg total) by mouth daily. 04/30/16   Lelon Perla, MD   Social History   Social History  . Marital status: Divorced    Spouse name: N/A  . Number of children: 2  . Years of education: N/A   Occupational History  .  Retired   Social History Main Topics  . Smoking status: Former Smoker    Years: 4.00  . Smokeless tobacco: Never Used     Comment: quit approx age 80-   . Alcohol use No  . Drug use: No  . Sexual activity: Yes   Other Topics Concern  . Not on file   Social History Narrative  . No narrative on file   Review of Systems  Constitutional: Negative for fatigue, fever and unexpected weight change.  HENT: Positive for congestion.   Eyes: Negative for visual disturbance.  Respiratory: Positive for cough and shortness of breath. Negative for chest tightness.   Cardiovascular: Positive for leg swelling. Negative for chest pain and palpitations.  Gastrointestinal: Negative for abdominal pain and blood in stool.  Musculoskeletal: Negative for myalgias.  Neurological: Negative for dizziness, light-headedness and headaches.   Objective:  Physical Exam  Constitutional: He is oriented to person, place, and time. He appears well-developed and well-nourished. No distress.  HENT:  Head: Normocephalic and atraumatic.  Right Ear: Tympanic membrane, external ear and ear canal normal.  Left Ear: Tympanic membrane, external ear and ear canal normal.  Nose: No rhinorrhea.  Mouth/Throat: Oropharynx is clear and moist and mucous membranes are normal. No oropharyngeal exudate or posterior  oropharyngeal erythema.  Eyes: Conjunctivae and EOM are normal. Pupils are equal, round, and reactive to light.  Neck: Neck supple. No JVD present. Carotid bruit is not present.  Cardiovascular: Normal heart sounds and intact distal pulses.  A regularly irregular rhythm present. Tachycardia present.  Exam reveals no friction rub.   No murmur heard. Overall rate is mildly elevated  Pulmonary/Chest: Effort normal and breath sounds normal. No respiratory distress. He has no wheezes. He has no rhonchi. He has no rales.  Abdominal: Soft. There is no tenderness.  Musculoskeletal: He exhibits no edema.  Lymphadenopathy:    He has no cervical adenopathy.  Neurological: He is alert and oriented to person, place, and time.  Skin: Skin is warm and dry. No rash noted.  Psychiatric: He has a normal mood and affect. His behavior is normal.  Nursing note and vitals reviewed.  BP 118/80 (BP Location: Right Arm, Patient Position:  Sitting, Cuff Size: Normal)   Pulse 96   Temp 98.5 F (36.9 C) (Oral)   Resp 16   Ht 5\' 10"  (1.778 m)   Wt 259 lb (117.5 kg)   SpO2 95%   BMI 37.16 kg/m    Dg Chest 2 View  Result Date: 07/29/2016 CLINICAL DATA:  Trouble breathing for several months EXAM: CHEST  2 VIEW COMPARISON:  11/21/2015 FINDINGS: Normal heart size, mediastinal contours, and pulmonary vascularity. Lungs emphysematous with linear scarring at RIGHT middle lobe. No acute infiltrate, pleural effusion or pneumothorax. Old healed anterior RIGHT rib fractures. Bones demineralized. IMPRESSION: Emphysematous changes with scarring at RIGHT middle lobe. Electronically Signed   By: Lavonia Dana M.D.   On: 07/29/2016 09:06   [EKG Reading]: Atrial fibrillation. Rate 91. No apparent acute changes. Results for orders placed or performed in visit on 07/29/16  POCT CBC  Result Value Ref Range   WBC 4.4 (A) 4.6 - 10.2 K/uL   Lymph, poc 1.6 0.6 - 3.4   POC LYMPH PERCENT 36.8 10 - 50 %L   MID (cbc) 0.3 0 - 0.9   POC MID  % 5.8 0 - 12 %M   POC Granulocyte 2.5 2 - 6.9   Granulocyte percent 57.4 37 - 80 %G   RBC 4.80 4.69 - 6.13 M/uL   Hemoglobin 16.2 14.1 - 18.1 g/dL   HCT, POC 45.1 43.5 - 53.7 %   MCV 93.9 80 - 97 fL   MCH, POC 33.7 (A) 27 - 31.2 pg   MCHC 35.9 (A) 31.8 - 35.4 g/dL   RDW, POC 13.7 %   Platelet Count, POC 105 (A) 142 - 424 K/uL   MPV 7.9 0 - 99.8 fL   Assessment & Plan:   Stanley Wright is a 71 y.o. male Dyspnea, unspecified type - Plan: DG Chest 2 View, EKG 12-Lead, POCT CBC, Pro b natriuretic peptide, Ambulatory referral to Pulmonology  Chronic atrial fibrillation ([place]) - Plan: EKG 12-Lead, Protime-INR  Chronic anticoagulation - Plan: Protime-INR  Factor V Leiden mutation ([place])  History of A. fib, overall appears to be fairly well rate controlled today, factor 5 Leiden mutation with history of remote PE now with dyspnea at rest for  approximately year and a half. Chest x-ray without acute findings, CBC reassuring, EKG without apparent changes.  - discussed repeat CT scanning of his chest even though he is on Coumadin as history of PE, but that was declined at this time. Advised let me know if he changes his mind, and certainly if no other source found from a cardiac standpoint, would recommend CT scanning.  - Keep appointment with cardiology for echocardiogram tomorrow, BNP ordered.  - Check PT/INR  -Refer to pulmonary to discuss dyspnea further in light of emphysematous change on chest x-ray, may have a component of COPD.  - ER/RTC precautions discussed.  No orders of the defined types were placed in this encounter.  Patient Instructions    For shortness of breath - keep echocardiogram appointment tomorrow and let them know you have been having shortness of breath to schedule appointment with cardiologist within a week if possible.   I would recommend a ct scan of your chest to rule out a blood clot in the lung. Let me know if the cardiologist does not identify a cause of your  shortness of breath, but I can order that anytime.  If any chest pain, would recommend that test right away.   I would recommend meeting  with pulmonologist to discuss shortness of breath further as there were some signs of possible emphysema on your chest xray.   Return to the clinic or go to the nearest emergency room if any of your symptoms worsen or new symptoms occur.   IF you received an x-ray today, you will receive an invoice from [facility]. Please contact [facility] at [phone] with questions or concerns regarding your invoice.   IF you received labwork today, you will receive an invoice from [facility]. Please contact [facility] at [phone] with questions or concerns regarding your invoice.   Our billing staff will not be able to assist you with questions regarding bills from these companies.  You will be contacted with the lab results as soon as they are available. The fastest way to get your results is to activate your My Chart account. Instructions are located on the last page of this paperwork. If you have not heard from [place] regarding the results in 2 weeks, please contact this office.       I personally performed the services described in this documentation, which was scribed in my presence. The recorded information has been reviewed and considered for accuracy and completeness, addended by me as needed, and agree with information above.  Signed,   Merri Ray, MD [facility] [place].  07/29/16 9:30 AM

## 2016-07-29 NOTE — Patient Instructions (Addendum)
  For shortness of breath - keep echocardiogram appointment tomorrow and let them know you have been having shortness of breath to schedule appointment with cardiologist within a week if possible.   I would recommend a ct scan of your chest to rule out a blood clot in the lung. Let me know if the cardiologist does not identify a cause of your shortness of breath, but I can order that anytime.  If any chest pain, would recommend that test right away.   I would recommend meeting with pulmonologist to discuss shortness of breath further as there were some signs of possible emphysema on your chest xray.   Return to the clinic or go to the nearest emergency room if any of your symptoms worsen or new symptoms occur.   IF you received an x-ray today, you will receive an invoice from [facility]. Please contact [facility] at [phone] with questions or concerns regarding your invoice.   IF you received labwork today, you will receive an invoice from [facility]. Please contact [facility] at [phone] with questions or concerns regarding your invoice.   Our billing staff will not be able to assist you with questions regarding bills from these companies.  You will be contacted with the lab results as soon as they are available. The fastest way to get your results is to activate your My Chart account. Instructions are located on the last page of this paperwork. If you have not heard from [place] regarding the results in 2 weeks, please contact this office.

## 2016-07-30 ENCOUNTER — Ambulatory Visit (HOSPITAL_BASED_OUTPATIENT_CLINIC_OR_DEPARTMENT_OTHER)
Admission: RE | Admit: 2016-07-30 | Discharge: 2016-07-30 | Disposition: A | Discharge status: Home / Self Care | Payer: PPO | Source: Ambulatory Visit | Attending: Cardiology | Admitting: Cardiology

## 2016-07-30 DIAGNOSIS — I340 Nonrheumatic mitral (valve) insufficiency: Secondary | ICD-10-CM | POA: Diagnosis not present

## 2016-07-30 DIAGNOSIS — I4891 Unspecified atrial fibrillation: Secondary | ICD-10-CM | POA: Insufficient documentation

## 2016-07-30 DIAGNOSIS — I429 Cardiomyopathy, unspecified: Secondary | ICD-10-CM

## 2016-07-30 LAB — PROTIME-INR
INR: 1.4 — ABNORMAL HIGH (ref 0.8–1.2)
Prothrombin Time: 14.2 s — ABNORMAL HIGH (ref 9.1–12.0)

## 2016-07-30 LAB — PRO B NATRIURETIC PEPTIDE: NT-PRO BNP: 792 pg/mL — AB (ref 0–376)

## 2016-08-01 ENCOUNTER — Telehealth: Payer: Self-pay | Admitting: *Deleted

## 2016-08-01 DIAGNOSIS — R931 Abnormal findings on diagnostic imaging of heart and coronary circulation: Secondary | ICD-10-CM

## 2016-08-01 NOTE — Telephone Encounter (Signed)
-----   Message from Lelon Perla, MD sent at 07/30/2016  3:50 PM EST ----- LV function mildly reduced; schedule lexiscan nuclear study to screen for ischemia Kirk Ruths

## 2016-08-01 NOTE — Telephone Encounter (Signed)
Spoke with pt, aware of echo results. Follow up scheduled for nuclear stress test. Instructions discussed with the patient.

## 2016-08-06 ENCOUNTER — Encounter (HOSPITAL_COMMUNITY): Payer: PPO

## 2016-08-06 ENCOUNTER — Telehealth (HOSPITAL_COMMUNITY): Payer: Self-pay

## 2016-08-06 NOTE — Telephone Encounter (Signed)
Encounter complete. 

## 2016-08-07 ENCOUNTER — Encounter (HOSPITAL_COMMUNITY): Payer: Self-pay | Admitting: *Deleted

## 2016-08-07 ENCOUNTER — Ambulatory Visit (HOSPITAL_COMMUNITY)
Admission: RE | Admit: 2016-08-07 | Discharge: 2016-08-07 | Disposition: A | Discharge status: Home / Self Care | Payer: PPO | Source: Ambulatory Visit | Attending: Cardiovascular Disease | Admitting: Cardiovascular Disease

## 2016-08-07 DIAGNOSIS — Z86711 Personal history of pulmonary embolism: Secondary | ICD-10-CM | POA: Insufficient documentation

## 2016-08-07 DIAGNOSIS — Z6837 Body mass index (BMI) 37.0-37.9, adult: Secondary | ICD-10-CM | POA: Insufficient documentation

## 2016-08-07 DIAGNOSIS — Z86718 Personal history of other venous thrombosis and embolism: Secondary | ICD-10-CM | POA: Diagnosis not present

## 2016-08-07 DIAGNOSIS — I509 Heart failure, unspecified: Secondary | ICD-10-CM | POA: Insufficient documentation

## 2016-08-07 DIAGNOSIS — G4733 Obstructive sleep apnea (adult) (pediatric): Secondary | ICD-10-CM | POA: Diagnosis not present

## 2016-08-07 DIAGNOSIS — I480 Paroxysmal atrial fibrillation: Secondary | ICD-10-CM | POA: Diagnosis not present

## 2016-08-07 DIAGNOSIS — E669 Obesity, unspecified: Secondary | ICD-10-CM | POA: Insufficient documentation

## 2016-08-07 DIAGNOSIS — Z8249 Family history of ischemic heart disease and other diseases of the circulatory system: Secondary | ICD-10-CM | POA: Diagnosis not present

## 2016-08-07 DIAGNOSIS — I429 Cardiomyopathy, unspecified: Secondary | ICD-10-CM | POA: Diagnosis not present

## 2016-08-07 DIAGNOSIS — R931 Abnormal findings on diagnostic imaging of heart and coronary circulation: Secondary | ICD-10-CM | POA: Diagnosis not present

## 2016-08-07 DIAGNOSIS — Z87891 Personal history of nicotine dependence: Secondary | ICD-10-CM | POA: Diagnosis not present

## 2016-08-07 LAB — MYOCARDIAL PERFUSION IMAGING
CHL CUP RESTING HR STRESS: 133 {beats}/min
NUC STRESS TID: 1.07
Peak HR: 157 {beats}/min
SDS: 6
SRS: 1
SSS: 6

## 2016-08-07 MED ORDER — REGADENOSON 0.4 MG/5ML IV SOLN
0.4000 mg | Freq: Once | INTRAVENOUS | Status: AC
  Administered 2016-08-07: 0.4 mg via INTRAVENOUS

## 2016-08-07 MED ORDER — AMINOPHYLLINE 25 MG/ML IV SOLN
75.0000 mg | Freq: Once | INTRAVENOUS | Status: AC
  Administered 2016-08-07: 75 mg via INTRAVENOUS

## 2016-08-07 MED ORDER — TECHNETIUM TC 99M TETROFOSMIN IV KIT
30.1000 | PACK | Freq: Once | INTRAVENOUS | Status: AC | PRN
  Administered 2016-08-07: 30.1 via INTRAVENOUS
  Filled 2016-08-07: qty 31

## 2016-08-07 MED ORDER — TECHNETIUM TC 99M TETROFOSMIN IV KIT
10.1000 | PACK | Freq: Once | INTRAVENOUS | Status: AC | PRN
  Administered 2016-08-07: 10.1 via INTRAVENOUS
  Filled 2016-08-07: qty 11

## 2016-08-07 NOTE — Progress Notes (Signed)
Dr Claiborne Billings reviewed Myoview study. Pt did not take his medicines the morning of the test. Ok per Dr Claiborne Billings to discharge pt home. Pt instructed to take his meds upon arrival home.

## 2016-08-22 ENCOUNTER — Ambulatory Visit (INDEPENDENT_AMBULATORY_CARE_PROVIDER_SITE_OTHER): Payer: PPO | Admitting: Internal Medicine

## 2016-08-22 ENCOUNTER — Encounter: Payer: Self-pay | Admitting: Internal Medicine

## 2016-08-22 VITALS — BP 112/80 | HR 62 | Ht 71.0 in | Wt 271.0 lb

## 2016-08-22 DIAGNOSIS — G4733 Obstructive sleep apnea (adult) (pediatric): Secondary | ICD-10-CM | POA: Diagnosis not present

## 2016-08-22 DIAGNOSIS — R0609 Other forms of dyspnea: Secondary | ICD-10-CM

## 2016-08-22 DIAGNOSIS — R0600 Dyspnea, unspecified: Secondary | ICD-10-CM

## 2016-08-22 DIAGNOSIS — Z9989 Dependence on other enabling machines and devices: Secondary | ICD-10-CM | POA: Diagnosis not present

## 2016-08-22 NOTE — Patient Instructions (Addendum)
Your breathing problem is not related to your lungs but more related to weight or fluid retention or reflux   Try prilosec otc 20mg   Take 30-60 min before first meal of the day and Pepcid ac (famotidine) 20 mg one @  bedtime for at least a month   GERD (REFLUX)  is an extremely common cause of respiratory symptoms just like yours , many times with no obvious heartburn at all.    It can be treated with medication, but also with lifestyle changes including elevation of the head of your bed (ideally with 6 inch  bed blocks),  Smoking cessation, avoidance of late meals, excessive alcohol, and avoid fatty foods, chocolate, peppermint, colas, red wine, and acidic juices such as orange juice.  NO MINT OR MENTHOL PRODUCTS SO NO COUGH DROPS  USE SUGARLESS CANDY INSTEAD (Jolley ranchers or Stover's or Life Savers) or even ice chips will also do - the key is to swallow to prevent all throat clearing. NO OIL BASED VITAMINS - use powdered substitutes.    If your problems at night continue, you will need to see a sleep specialist and I would happy to refer you or let your primary care doctor can do this  Pulmonary follow up is not needed

## 2016-08-22 NOTE — Progress Notes (Signed)
Subjective:     Patient ID: Stanley Wright, male   DOB: [birth year],    MRN: [id number]  HPI  89 yowm quit smoking 1987  On lifelong coumadin for F V Leiden s/p R lung bx 2007 [place] = benign/ afib with ef 45/  referred to pulmonary clinic 08/22/2016 by Dr   August Albino for sob   08/22/2016 1st [facility] office visit/ Stanley Wright   Chief Complaint  Patient presents with  . Pulmonary Consult    Referred by Dr. Merri Ray. Pt c/o SOB for the past 1 1/2 yrs, worse since he had Influenza in Feb 2018. Pt states he only gets SOB when he talks or lies down. He also c/o prod cough with clear sputum, starts once he lies down.   onset x 1.5 years prior to OV  =  MMRC1 = can walk nl pace, flat grade, can't hurry or go uphills or steps s sob    Assoc with cough non productive x worse breathing supine and with voice use   Stopped gerd rx about the time started noting problems with voice and sleep but denies HB and doesn't think he needs prilosec   No obvious day to day or daytime variability or assoc excess/ purulent sputum or mucus plugs or hemoptysis or cp or chest tightness, subjective wheeze or overt sinus or hb symptoms. No unusual exp hx or h/o childhood pna/ asthma or knowledge of premature birth.  Sleeping ok without nocturnal  or early am exacerbation  of respiratory  c/o's or need for noct saba. Also denies any obvious fluctuation of symptoms with weather or environmental changes or other aggravating or alleviating factors except as outlined above   Current Medications, Allergies, Complete Past Medical History, Past Surgical History, Family History, and Social History were reviewed in [employer] record.  ROS  The following are not active complaints unless bolded sore throat, dysphagia, dental problems, itching, sneezing,  nasal congestion or excess/ purulent secretions, ear ache,   fever, chills, sweats, unintended wt loss, classically pleuritic or exertional cp,   orthopnea pnd or leg swelling, presyncope, palpitations, abdominal pain, anorexia, nausea, vomiting, diarrhea  or change in bowel or bladder habits, change in stools or urine, dysuria,hematuria,  rash, arthralgias, visual complaints, headache, numbness, weakness or ataxia or problems with walking or coordination,  change in mood/affect or memory.             Review of Systems     Objective:   Physical Exam  amb wm very abnormal affect  Wt Readings from Last 3 Encounters:  08/22/16 271 lb (122.9 kg)  08/07/16 259 lb (117.5 kg)  07/29/16 259 lb (117.5 kg)    Vital signs reviewed    HEENT: nl dentition, turbinates bilaterally, and oropharynx. Nl external ear canals without cough reflex   NECK :  without JVD/Nodes/TM/ nl carotid upstrokes bilaterally   LUNGS: no acc muscle use,  Nl contour chest which is clear to A and P bilaterally without cough on insp or exp maneuvers   CV:  RRR  no s3 or murmur or increase in P2, and 1-2+ ptting both lower ext pitting edema   ABD:  soft and nontender with nl inspiratory excursion in the supine position. No bruits or organomegaly appreciated, bowel sounds nl  MS:  Nl gait/ ext warm without deformities, calf tenderness, cyanosis or clubbing No obvious joint restrictions   SKIN: warm and dry without lesions    NEURO:  alert, approp, nl  sensorium with  no motor or cerebellar deficits apparent.     CXR PA and Lateral:   08/22/2016 :    I personally reviewed images and agree with radiology impression as follows:    Emphysematous changes with scarring at RIGHT middle lobe.  Labs ordered/ reviewed:      Chemistry      Component Value Date/Time   NA 139 02/19/2016 0848   K 4.1 02/19/2016 0848   CL 108 02/19/2016 0848   CO2 25 02/19/2016 0848   BUN 11 02/19/2016 0848   CREATININE 0.93 02/19/2016 0848   CREATININE 0.88 06/12/2015 1505      Component Value Date/Time   CALCIUM 9.1 02/19/2016 0848   ALKPHOS 54 06/12/2015 1505   AST  18 06/12/2015 1505   ALT 20 06/12/2015 1505   BILITOT 0.4 06/12/2015 1505        Lab Results  Component Value Date   WBC 4.4 (A) 07/29/2016   HGB 16.2 07/29/2016   HCT 45.1 07/29/2016   MCV 93.9 07/29/2016   PLT 169 02/19/2016       Lab Results  Component Value Date   TSH 2.88 10/08/2015     Lab Results  Component Value Date   PROBNP 792 (H) 07/29/2016              Assessment:

## 2016-08-23 DIAGNOSIS — Z9989 Dependence on other enabling machines and devices: Secondary | ICD-10-CM | POA: Insufficient documentation

## 2016-08-23 DIAGNOSIS — G4733 Obstructive sleep apnea (adult) (pediatric): Secondary | ICD-10-CM | POA: Insufficient documentation

## 2016-08-23 NOTE — Assessment & Plan Note (Signed)
Not apparently using cpap consistently but says machine "works fine" but can't tol the mask > consider different masks or refer to sleep specialist prn

## 2016-08-23 NOTE — Assessment & Plan Note (Signed)
Body mass index is 37.8 kg/m.  Trending up  Lab Results  Component Value Date   TSH 2.88 16/38/4536     Complicated by OSA and contributing to gerd risk/ doe/reviewed the need and the process to achieve and maintain neg calorie balance > defer f/u primary care including intermittently monitoring thyroid status

## 2016-08-23 NOTE — Assessment & Plan Note (Addendum)
08/22/2016  Walked RA x 3 laps @ 185 ft each stopped due to  End of study, fast pace, no sob or desat    - Spirometry 08/22/2016  Nl s curvature   Symptoms are markedly disproportionate to objective findings and not clear this is a lung problem but pt does appear to have difficult airway management issues. DDX of  difficult airways management almost all start with A and  include Adherence, Ace Inhibitors, Acid Reflux, Active Sinus Disease, Alpha 1 Antitripsin deficiency, Anxiety masquerading as Airways dz,  ABPA,  Allergy(esp in young), Aspiration (esp in elderly), Adverse effects of meds,  Active smokers, A bunch of PE's (a small clot burden can't cause this syndrome unless there is already severe underlying pulm or vascular dz with poor reserve) plus two Bs  = Bronchiectasis and Beta blocker use..and one C= CHF   Adherence is always the initial "prime suspect" and is a multilayered concern that requires a "trust but verify" approach in every patient - starting with knowing how to use medications, especially inhalers, correctly, keeping up with refills and understanding the fundamental difference between maintenance and prns vs those medications only taken for a very short course and then stopped and not refilled.   ? Acid (or non-acid) GERD > always difficult to exclude as up to 75% of pts in some series report no assoc GI/ Heartburn symptoms> rec max (24h)  acid suppression and diet restrictions/ reviewed and instructions given in writing.   ? Allergy/ asthma > consider further w/u perhaps with Methacholine if not improving but lack of variability with weather/ seasons makes this much less likely   ? Active sinus dz/ rhinitis > consider CT sinus with allergy w/u if not better  ? Anxiety > usually at the bottom of this list of usual suspects but should be much higher on this pt's based on H and P and note already on psychotropics .    ? A bunch of PE's > Pt with [facility] leiden but very unlikely on coumadin  for AF  ? chf >  Not obvious on cxr now but he is very edematous > rx per cards planned  Total time devoted to counseling  > 50 % of initial 60 min office visit:  review case with pt/ discussion of options/alternatives/ personally creating written customized instructions  in presence of pt  then going over those specific  Instructions directly with the pt including how to use all of the meds but in particular covering each new medication in detail and the difference between the maintenance= "automatic" meds and the prns using an action plan format for the latter (If this problem/symptom => do that organization reading Left to right).  Please see AVS from this visit for a full list of these instructions which I personally wrote for this pt and  are unique to this visit.

## 2016-09-23 ENCOUNTER — Ambulatory Visit (INDEPENDENT_AMBULATORY_CARE_PROVIDER_SITE_OTHER): Payer: PPO | Admitting: Family Medicine

## 2016-09-23 ENCOUNTER — Ambulatory Visit (INDEPENDENT_AMBULATORY_CARE_PROVIDER_SITE_OTHER): Payer: PPO

## 2016-09-23 ENCOUNTER — Telehealth: Payer: Self-pay | Admitting: *Deleted

## 2016-09-23 ENCOUNTER — Ambulatory Visit (INDEPENDENT_AMBULATORY_CARE_PROVIDER_SITE_OTHER): Payer: PPO | Admitting: *Deleted

## 2016-09-23 VITALS — BP 138/84 | HR 70 | Temp 98.3°F | Resp 22 | Ht 70.0 in | Wt 270.0 lb

## 2016-09-23 DIAGNOSIS — R109 Unspecified abdominal pain: Secondary | ICD-10-CM

## 2016-09-23 DIAGNOSIS — Z7901 Long term (current) use of anticoagulants: Secondary | ICD-10-CM

## 2016-09-23 DIAGNOSIS — D682 Hereditary deficiency of other clotting factors: Secondary | ICD-10-CM | POA: Diagnosis not present

## 2016-09-23 DIAGNOSIS — R0789 Other chest pain: Secondary | ICD-10-CM

## 2016-09-23 DIAGNOSIS — Z5181 Encounter for therapeutic drug level monitoring: Secondary | ICD-10-CM | POA: Diagnosis not present

## 2016-09-23 DIAGNOSIS — S2232XA Fracture of one rib, left side, initial encounter for closed fracture: Secondary | ICD-10-CM | POA: Diagnosis not present

## 2016-09-23 LAB — POCT URINALYSIS DIP (MANUAL ENTRY)
BILIRUBIN UA: NEGATIVE
BILIRUBIN UA: NEGATIVE
GLUCOSE UA: NEGATIVE
Leukocytes, UA: NEGATIVE
Nitrite, UA: NEGATIVE
Protein Ur, POC: 30 — AB
RBC UA: NEGATIVE
SPEC GRAV UA: 1.025 (ref 1.030–1.035)
Urobilinogen, UA: 0.2 (ref ?–2.0)
pH, UA: 6 (ref 5.0–8.0)

## 2016-09-23 LAB — POC MICROSCOPIC URINALYSIS (UMFC): MUCUS RE: ABSENT

## 2016-09-23 LAB — POCT INR: INR: 2.6

## 2016-09-23 MED ORDER — CYCLOBENZAPRINE HCL 5 MG PO TABS: ORAL_TABLET | ORAL | 0 refills | Status: DC

## 2016-09-23 MED ORDER — TRAMADOL HCL 50 MG PO TABS: 50.0000 mg | ORAL_TABLET | Freq: Four times a day (QID) | ORAL | 0 refills | Status: DC | PRN

## 2016-09-23 NOTE — Patient Instructions (Addendum)
Your urine test did not indicate any blood, so less likely kidney stone. X-ray did show a ninth rib fracture on the left side which is likely the cause of your pain. Heat or ice to affected areas needed, see other information below. If needed for pain, you can take the tramadol up to every 6 hours. Recheck in the next 1-2 weeks, sooner if any worsening of pain, shortness of breath, fevers, or other worsening symptoms. If you need a refill of the tramadol, return to recheck pain and refill med at that time.    Rib Fracture A rib fracture is a break or crack in one of the bones of the ribs. The ribs are a group of long, curved bones that wrap around your chest and attach to your spine. They protect your lungs and other organs in the chest cavity. A broken or cracked rib is often painful, but most do not cause other problems. Most rib fractures heal on their own over time. However, rib fractures can be more serious if multiple ribs are broken or if broken ribs move out of place and push against other structures. What are the causes?  A direct blow to the chest. For example, this could happen during contact sports, a car accident, or a fall against a hard object.  Repetitive movements with high force, such as pitching a baseball or having severe coughing spells. What are the signs or symptoms?  Pain when you breathe in or cough.  Pain when someone presses on the injured area. How is this diagnosed? Your caregiver will perform a physical exam. Various imaging tests may be ordered to confirm the diagnosis and to look for related injuries. These tests may include a chest X-ray, computed tomography (CT), magnetic resonance imaging (MRI), or a bone scan. How is this treated? Rib fractures usually heal on their own in 1-3 months. The longer healing period is often associated with a continued cough or other aggravating activities. During the healing period, pain control is very important. Medication is  usually given to control pain. Hospitalization or surgery may be needed for more severe injuries, such as those in which multiple ribs are broken or the ribs have moved out of place. Follow these instructions at home:  Avoid strenuous activity and any activities or movements that cause pain. Be careful during activities and avoid bumping the injured rib.  Gradually increase activity as directed by your caregiver.  Only take over-the-counter or prescription medications as directed by your caregiver. Do not take other medications without asking your caregiver first.  Apply ice to the injured area for the first 1-2 days after you have been treated or as directed by your caregiver. Applying ice helps to reduce inflammation and pain.  Put ice in a plastic bag.  Place a towel between your skin and the bag.  Leave the ice on for 15-20 minutes at a time, every 2 hours while you are awake.  Perform deep breathing as directed by your caregiver. This will help prevent pneumonia, which is a common complication of a broken rib. Your caregiver may instruct you to:  Take deep breaths several times a day.  Try to cough several times a day, holding a pillow against the injured area.  Use a device called an incentive spirometer to practice deep breathing several times a day.  Drink enough fluids to keep your urine clear or pale yellow. This will help you avoid constipation.  Do not wear a rib belt or binder.  These restrict breathing, which can lead to pneumonia. Get help right away if:  You have a fever.  You have difficulty breathing or shortness of breath.  You develop a continual cough, or you cough up thick or bloody sputum.  You feel sick to your stomach (nausea), throw up (vomit), or have abdominal pain.  You have worsening pain not controlled with medications. This information is not intended to replace advice given to you by your health care provider. Make sure you discuss any questions  you have with your health care provider. Document Released: 06/02/2005 Document Revised: 11/08/2015 Document Reviewed: 08/04/2012 Elsevier Interactive Patient Education  2017 [employer].   IF you received an x-ray today, you will receive an invoice from [facility]. Please contact [facility] at [phone] with questions or concerns regarding your invoice.   IF you received labwork today, you will receive an invoice from [facility]. Please contact [facility] at [phone] with questions or concerns regarding your invoice.   Our billing staff will not be able to assist you with questions regarding bills from these companies.  You will be contacted with the lab results as soon as they are available. The fastest way to get your results is to activate your My Chart account. Instructions are located on the last page of this paperwork. If you have not heard from [place] regarding the results in 2 weeks, please contact this office.

## 2016-09-23 NOTE — Telephone Encounter (Signed)
Spoke with patient to inform him that AVS is mailed. Patient unable to schedule 1 week appt at this time. He stated that he will call the office in the am 09/24/16 to schedule.

## 2016-09-23 NOTE — Progress Notes (Signed)
Subjective:  By signing my name below, I, Essence Howell, attest that this documentation has been prepared under the direction and in the presence of Wendie Agreste, MD Electronically Signed: Ladene Artist, ED Scribe 09/23/2016 at 2:13 PM.   Patient ID: Revonda Standard, male    DOB: 1945/07/03, 71 y.o.   MRN: [id number]  Chief Complaint  Patient presents with  . Flank Pain    Left side, possible kidney stone   HPI LEEAM CEDRONE is a 71 y.o. male who presents to Primary Care at [facility] complaining of gradual onset of constant left flank pain onset 3 days ago. Pt describes pain as a sharp sensation that radiates into his abdomen with movement, deep breathing and coughing. No known injury. No treatments tried PTA. Pt denies rash, cough, new sob. He denies h/o shingles or previous kidney stones.   Patient Active Problem List   Diagnosis Date Noted  . Morbid obesity due to excess calories ([place]) 08/23/2016  . OSA on CPAP 08/23/2016  . Dyspnea on exertion 08/22/2016  . Umbilical hernia 94/[id number]/9628  . Persistent atrial fibrillation ([place])   . Atrial fibrillation ([place]) 10/01/2015  . Encounter for therapeutic drug monitoring 08/12/2013  . Long term current use of anticoagulant 07/30/2010  . COLONIC POLYPS 06/03/2010  . 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 GERD 06/03/2010  . HIATAL HERNIA 06/03/2010  . BENIGN PROSTATIC HYPERTROPHY, WITH OBSTRUCTION 06/03/2010  . PSORIASIS 06/03/2010   Past Medical History:  Diagnosis Date  . Arthritis   . BENIGN PROSTATIC HYPERTROPHY, WITH OBSTRUCTION   . Cancer (HCC)    skin, basal, squamous  . COLONIC POLYPS   . Diverticulitis   . DVT (deep venous thrombosis) ([place]) 2000  . Dysrhythmia    Hx Afib- 2017  . Factor 5 Leiden mutation, heterozygous ([place])   . GERD (gastroesophageal reflux disease)    not on medication  . HIATAL HERNIA   . ITP (idiopathic thrombocytopenic purpura) 2003  . Long term current use of anticoagulant   . PSORIASIS   .  Pulmonary embolus ([place]) 2000  . Shortness of breath dyspnea    at times - Talking alot  . Sleep apnea    Past Surgical History:  Procedure Laterality Date  . CARDIOVERSION N/A 11/22/2015   Procedure: CARDIOVERSION;  Surgeon: Sanda Klein, MD;  Location: [facility];  Service: Cardiovascular;  Laterality: N/A;  . COLONOSCOPY    . HERNIA REPAIR Left    Inguinal- x2 . mesh x1  . INSERTION OF MESH N/A 02/25/2016   Procedure: INSERTION OF MESH;  Surgeon: Rolm Bookbinder, MD;  Location: [facility];  Service: General;  Laterality: N/A;  . LUNG REMOVAL, PARTIAL Right 2004  . PROSTATE SURGERY    . UMBILICAL HERNIA REPAIR N/A 02/25/2016   Procedure: LAPAROSCOPIC UMBILICAL HERNIA REPAIR;  Surgeon: Rolm Bookbinder, MD;  Location: [facility];  Service: General;  Laterality: N/A;   Allergies  Allergen Reactions  . Tylenol [Acetaminophen] Other (See Comments)    Pt states he had a DNA test completed that stated he should never take tylenol.   Prior to Admission medications   Medication Sig Start Date End Date Taking? Authorizing Provider  diltiazem (TIAZAC) 300 MG 24 hr capsule Take 1 capsule (300 mg total) by mouth daily. 03/11/16  Yes Lelon Perla, MD  warfarin (COUMADIN) 5 MG tablet Take 1 tablet (5 mg total) by mouth daily. 04/30/16  Yes Lelon Perla, MD   Social History   Social History  .  Marital status: Divorced    Spouse name: N/A  . Number of children: 2  . Years of education: N/A   Occupational History  .  Retired   Social History Main Topics  . Smoking status: Former Smoker    Packs/day: 1.00    Quit date: 06/16/1985  . Smokeless tobacco: Never Used     Comment: quit approx age 89-   . Alcohol use No  . Drug use: No  . Sexual activity: Yes   Other Topics Concern  . Not on file   Social History Narrative  . No narrative on file   Review of Systems  Respiratory: Negative for cough and shortness of breath.   Genitourinary: Positive for flank pain.  Skin: Negative for  rash.      Objective:   Physical Exam  Constitutional: He is oriented to person, place, and time. He appears well-developed and well-nourished. No distress.  HENT:  Head: Normocephalic and atraumatic.  Eyes: Conjunctivae and EOM are normal.  Neck: Neck supple. No tracheal deviation present.  Cardiovascular: Normal rate.   Pulmonary/Chest: Effort normal. No respiratory distress.  Abdominal: Soft. There is no tenderness.  Musculoskeletal: Normal range of motion.  Pt describes area of pain in the mid axillary line/ lower rib margin. Discomfort with rotation to the L.   Neurological: He is alert and oriented to person, place, and time.  Skin: Skin is warm and dry.  Slight erythema from rubbing on skin. No blistering. 1 small erythematous papule at the posterior axillary line, similar area. Skin is not sensitive in the affected area, no burning or tingling.  Psychiatric: He has a normal mood and affect. His behavior is normal.  Nursing note and vitals reviewed.  Vitals:   09/23/16 1345  BP: 138/84  Pulse: 70  Resp: (!) 22  Temp: 98.3 F (36.8 C)  TempSrc: Oral  SpO2: 96%  Weight: 270 lb (122.5 kg)  Height: 5\' 10"  (1.778 m)   Results for orders placed or performed in visit on 09/23/16  POCT urinalysis dipstick  Result Value Ref Range   Color, UA yellow yellow   Clarity, UA clear clear   Glucose, UA negative negative   Bilirubin, UA negative negative   Ketones, POC UA negative negative   Spec Grav, UA 1.025 1.030 - 1.035   Blood, UA negative negative   pH, UA 6.0 5.0 - 8.0   Protein Ur, POC =30 (A) negative   Urobilinogen, UA 0.2 Negative - 2.0   Nitrite, UA Negative Negative   Leukocytes, UA Negative Negative   Dg Ribs Unilateral W/chest Left  Result Date: 09/23/2016 CLINICAL DATA:  Left flank pain, evaluate for kidney stones EXAM: LEFT RIBS AND CHEST - 3+ VIEW COMPARISON:  Chest x-ray of 07/29/2016 FINDINGS: On left rib detail films there is a questionable fracture  involving the left lateral ninth rib on one view. No other evidence of left rib fracture is seen. The heart is within upper limits normal. IMPRESSION: Cannot exclude a nondisplaced fracture of the left lateral ninth rib. Electronically Signed   By: Ivar Drape M.D.   On: 09/23/2016 14:51   Dg Abd 1 View  Result Date: 09/23/2016 CLINICAL DATA:  Left flank pain, evaluate for kidney stone EXAM: ABDOMEN - 1 VIEW COMPARISON:  Lumbar spine films of 11/21/2015 and CT abdomen and pelvis of 12/03/2009 FINDINGS: Unfortunately for the assessment of renal calculi, there is a large amount of feces throughout the transverse colon with bowel gas. Renal calculi  therefore are difficult to evaluate in this patient by plain film. There is a questionable calcification overlying the medial right kidney of 8 mm in diameter which could represent right pelvic calculus or an opacity within overlapping bowel. No calcifications are seen along the expected courses of the ureters. No bony abnormality is seen. IMPRESSION: 1. Large amount of feces overlying the renal outlines hinders assessment of renal calculi. 2. Cannot exclude 8 mm calculus overlying the region of the right renal pelvis although this opacity may well be within overlying the transverse colon. Electronically Signed   By: Ivar Drape M.D.   On: 09/23/2016 14:49      Assessment & Plan:   WYETH HOFFER is a 71 y.o. male Left flank pain - Plan: POCT Microscopic Urinalysis ([facility]), POCT urinalysis dipstick, DG Abd 1 View  Left-sided chest wall pain - Plan: DG Ribs Unilateral W/Chest Left  Closed fracture of one rib of left side, initial encounter  Suspected ninth rib fracture on left as that is in the area of his discomfort. No known specific injury, but he may have had a contusion with lifting boxes. X-ray noted with possible nephrolith on the right, but he is asymptomatic on that side, and overlying stool may be cause.   -Tramadol prescribed as needed for more  severe pain, rib fracture handout given with RTC/ER precautions.  -Recheck in the next 2 weeks. Sooner if needed   -Stool softeners and constipation treatment discussed  Meds ordered this encounter  Medications  . DISCONTD: cyclobenzaprine (FLEXERIL) 5 MG tablet    Sig: 1 pill by mouth up to every 8 hours as needed. Start with one pill by mouth each bedtime as needed due to sedation    Dispense:  15 tablet    Refill:  0  . traMADol (ULTRAM) 50 MG tablet    Sig: Take 1 tablet (50 mg total) by mouth every 6 (six) hours as needed.    Dispense:  20 tablet    Refill:  0   Patient Instructions   Your urine test did not indicate any blood, so less likely kidney stone. X-ray did show a ninth rib fracture on the left side which is likely the cause of your pain. Heat or ice to affected areas needed, see other information below. If needed for pain, you can take the tramadol up to every 6 hours. Recheck in the next 1-2 weeks, sooner if any worsening of pain, shortness of breath, fevers, or other worsening symptoms. If you need a refill of the tramadol, return to recheck pain and refill med at that time.    Rib Fracture A rib fracture is a break or crack in one of the bones of the ribs. The ribs are a group of long, curved bones that wrap around your chest and attach to your spine. They protect your lungs and other organs in the chest cavity. A broken or cracked rib is often painful, but most do not cause other problems. Most rib fractures heal on their own over time. However, rib fractures can be more serious if multiple ribs are broken or if broken ribs move out of place and push against other structures. What are the causes?  A direct blow to the chest. For example, this could happen during contact sports, a car accident, or a fall against a hard object.  Repetitive movements with high force, such as pitching a baseball or having severe coughing spells. What are the signs or symptoms?  Pain  when  you breathe in or cough.  Pain when someone presses on the injured area. How is this diagnosed? Your caregiver will perform a physical exam. Various imaging tests may be ordered to confirm the diagnosis and to look for related injuries. These tests may include a chest X-ray, computed tomography (CT), magnetic resonance imaging (MRI), or a bone scan. How is this treated? Rib fractures usually heal on their own in 1-3 months. The longer healing period is often associated with a continued cough or other aggravating activities. During the healing period, pain control is very important. Medication is usually given to control pain. Hospitalization or surgery may be needed for more severe injuries, such as those in which multiple ribs are broken or the ribs have moved out of place. Follow these instructions at home:  Avoid strenuous activity and any activities or movements that cause pain. Be careful during activities and avoid bumping the injured rib.  Gradually increase activity as directed by your caregiver.  Only take over-the-counter or prescription medications as directed by your caregiver. Do not take other medications without asking your caregiver first.  Apply ice to the injured area for the first 1-2 days after you have been treated or as directed by your caregiver. Applying ice helps to reduce inflammation and pain.  Put ice in a plastic bag.  Place a towel between your skin and the bag.  Leave the ice on for 15-20 minutes at a time, every 2 hours while you are awake.  Perform deep breathing as directed by your caregiver. This will help prevent pneumonia, which is a common complication of a broken rib. Your caregiver may instruct you to:  Take deep breaths several times a day.  Try to cough several times a day, holding a pillow against the injured area.  Use a device called an incentive spirometer to practice deep breathing several times a day.  Drink enough fluids to keep your  urine clear or pale yellow. This will help you avoid constipation.  Do not wear a rib belt or binder. These restrict breathing, which can lead to pneumonia. Get help right away if:  You have a fever.  You have difficulty breathing or shortness of breath.  You develop a continual cough, or you cough up thick or bloody sputum.  You feel sick to your stomach (nausea), throw up (vomit), or have abdominal pain.  You have worsening pain not controlled with medications. This information is not intended to replace advice given to you by your health care provider. Make sure you discuss any questions you have with your health care provider. Document Released: 06/02/2005 Document Revised: 11/08/2015 Document Reviewed: 08/04/2012 Elsevier Interactive Patient Education  2017 [employer].   IF you received an x-ray today, you will receive an invoice from [facility] Radiology. Please contact [facility] Radiology at [phone] with questions or concerns regarding your invoice.   IF you received labwork today, you will receive an invoice from [place]. Please contact [facility] at [phone] with questions or concerns regarding your invoice.   Our billing staff will not be able to assist you with questions regarding bills from these companies.  You will be contacted with the lab results as soon as they are available. The fastest way to get your results is to activate your My Chart account. Instructions are located on the last page of this paperwork. If you have not heard from [place] regarding the results in 2 weeks, please contact this office.      I  personally performed the services described in this documentation, which was scribed in my presence. The recorded information has been reviewed and considered for accuracy and completeness, addended by me as needed, and agree with information above.  Signed,   Merri Ray, MD Primary [facility] at [place].  09/24/16 3:24  PM

## 2016-11-07 ENCOUNTER — Ambulatory Visit (INDEPENDENT_AMBULATORY_CARE_PROVIDER_SITE_OTHER): Payer: PPO | Admitting: Pharmacist

## 2016-11-07 DIAGNOSIS — Z7901 Long term (current) use of anticoagulants: Secondary | ICD-10-CM | POA: Diagnosis not present

## 2016-11-07 DIAGNOSIS — Z5181 Encounter for therapeutic drug level monitoring: Secondary | ICD-10-CM | POA: Diagnosis not present

## 2016-11-07 DIAGNOSIS — D682 Hereditary deficiency of other clotting factors: Secondary | ICD-10-CM | POA: Diagnosis not present

## 2016-11-07 LAB — POCT INR: INR: 2.1

## 2016-12-03 DIAGNOSIS — M9903 Segmental and somatic dysfunction of lumbar region: Secondary | ICD-10-CM | POA: Diagnosis not present

## 2016-12-03 DIAGNOSIS — M545 Low back pain: Secondary | ICD-10-CM | POA: Diagnosis not present

## 2016-12-03 DIAGNOSIS — M9901 Segmental and somatic dysfunction of cervical region: Secondary | ICD-10-CM | POA: Diagnosis not present

## 2016-12-03 DIAGNOSIS — M9904 Segmental and somatic dysfunction of sacral region: Secondary | ICD-10-CM | POA: Diagnosis not present

## 2016-12-05 DIAGNOSIS — M545 Low back pain: Secondary | ICD-10-CM | POA: Diagnosis not present

## 2016-12-05 DIAGNOSIS — M9904 Segmental and somatic dysfunction of sacral region: Secondary | ICD-10-CM | POA: Diagnosis not present

## 2016-12-05 DIAGNOSIS — M9903 Segmental and somatic dysfunction of lumbar region: Secondary | ICD-10-CM | POA: Diagnosis not present

## 2016-12-05 DIAGNOSIS — M9901 Segmental and somatic dysfunction of cervical region: Secondary | ICD-10-CM | POA: Diagnosis not present

## 2016-12-08 DIAGNOSIS — M9901 Segmental and somatic dysfunction of cervical region: Secondary | ICD-10-CM | POA: Diagnosis not present

## 2016-12-08 DIAGNOSIS — M545 Low back pain: Secondary | ICD-10-CM | POA: Diagnosis not present

## 2016-12-08 DIAGNOSIS — M9903 Segmental and somatic dysfunction of lumbar region: Secondary | ICD-10-CM | POA: Diagnosis not present

## 2016-12-08 DIAGNOSIS — M9904 Segmental and somatic dysfunction of sacral region: Secondary | ICD-10-CM | POA: Diagnosis not present

## 2016-12-10 DIAGNOSIS — M545 Low back pain: Secondary | ICD-10-CM | POA: Diagnosis not present

## 2016-12-10 DIAGNOSIS — M9901 Segmental and somatic dysfunction of cervical region: Secondary | ICD-10-CM | POA: Diagnosis not present

## 2016-12-10 DIAGNOSIS — M9904 Segmental and somatic dysfunction of sacral region: Secondary | ICD-10-CM | POA: Diagnosis not present

## 2016-12-10 DIAGNOSIS — M9903 Segmental and somatic dysfunction of lumbar region: Secondary | ICD-10-CM | POA: Diagnosis not present

## 2016-12-12 DIAGNOSIS — M545 Low back pain: Secondary | ICD-10-CM | POA: Diagnosis not present

## 2016-12-12 DIAGNOSIS — M9904 Segmental and somatic dysfunction of sacral region: Secondary | ICD-10-CM | POA: Diagnosis not present

## 2016-12-12 DIAGNOSIS — M9901 Segmental and somatic dysfunction of cervical region: Secondary | ICD-10-CM | POA: Diagnosis not present

## 2016-12-12 DIAGNOSIS — M9903 Segmental and somatic dysfunction of lumbar region: Secondary | ICD-10-CM | POA: Diagnosis not present

## 2016-12-15 DIAGNOSIS — M9904 Segmental and somatic dysfunction of sacral region: Secondary | ICD-10-CM | POA: Diagnosis not present

## 2016-12-15 DIAGNOSIS — M9903 Segmental and somatic dysfunction of lumbar region: Secondary | ICD-10-CM | POA: Diagnosis not present

## 2016-12-15 DIAGNOSIS — M545 Low back pain: Secondary | ICD-10-CM | POA: Diagnosis not present

## 2016-12-15 DIAGNOSIS — M9901 Segmental and somatic dysfunction of cervical region: Secondary | ICD-10-CM | POA: Diagnosis not present

## 2016-12-29 DIAGNOSIS — M545 Low back pain: Secondary | ICD-10-CM | POA: Diagnosis not present

## 2016-12-29 DIAGNOSIS — M9901 Segmental and somatic dysfunction of cervical region: Secondary | ICD-10-CM | POA: Diagnosis not present

## 2016-12-29 DIAGNOSIS — M9903 Segmental and somatic dysfunction of lumbar region: Secondary | ICD-10-CM | POA: Diagnosis not present

## 2016-12-29 DIAGNOSIS — M9904 Segmental and somatic dysfunction of sacral region: Secondary | ICD-10-CM | POA: Diagnosis not present

## 2016-12-30 ENCOUNTER — Ambulatory Visit (INDEPENDENT_AMBULATORY_CARE_PROVIDER_SITE_OTHER): Payer: PPO | Admitting: *Deleted

## 2016-12-30 DIAGNOSIS — D682 Hereditary deficiency of other clotting factors: Secondary | ICD-10-CM

## 2016-12-30 DIAGNOSIS — Z7901 Long term (current) use of anticoagulants: Secondary | ICD-10-CM | POA: Diagnosis not present

## 2016-12-30 DIAGNOSIS — Z5181 Encounter for therapeutic drug level monitoring: Secondary | ICD-10-CM

## 2016-12-30 LAB — POCT INR: INR: 3.1

## 2017-01-02 DIAGNOSIS — M9903 Segmental and somatic dysfunction of lumbar region: Secondary | ICD-10-CM | POA: Diagnosis not present

## 2017-01-02 DIAGNOSIS — M545 Low back pain: Secondary | ICD-10-CM | POA: Diagnosis not present

## 2017-01-02 DIAGNOSIS — M9901 Segmental and somatic dysfunction of cervical region: Secondary | ICD-10-CM | POA: Diagnosis not present

## 2017-01-02 DIAGNOSIS — M9904 Segmental and somatic dysfunction of sacral region: Secondary | ICD-10-CM | POA: Diagnosis not present

## 2017-01-07 DIAGNOSIS — M9904 Segmental and somatic dysfunction of sacral region: Secondary | ICD-10-CM | POA: Diagnosis not present

## 2017-01-07 DIAGNOSIS — M9901 Segmental and somatic dysfunction of cervical region: Secondary | ICD-10-CM | POA: Diagnosis not present

## 2017-01-07 DIAGNOSIS — M9903 Segmental and somatic dysfunction of lumbar region: Secondary | ICD-10-CM | POA: Diagnosis not present

## 2017-01-07 DIAGNOSIS — M545 Low back pain: Secondary | ICD-10-CM | POA: Diagnosis not present

## 2017-01-14 DIAGNOSIS — M9904 Segmental and somatic dysfunction of sacral region: Secondary | ICD-10-CM | POA: Diagnosis not present

## 2017-01-14 DIAGNOSIS — M9901 Segmental and somatic dysfunction of cervical region: Secondary | ICD-10-CM | POA: Diagnosis not present

## 2017-01-14 DIAGNOSIS — M545 Low back pain: Secondary | ICD-10-CM | POA: Diagnosis not present

## 2017-01-14 DIAGNOSIS — M9903 Segmental and somatic dysfunction of lumbar region: Secondary | ICD-10-CM | POA: Diagnosis not present

## 2017-01-21 DIAGNOSIS — M9904 Segmental and somatic dysfunction of sacral region: Secondary | ICD-10-CM | POA: Diagnosis not present

## 2017-01-21 DIAGNOSIS — M545 Low back pain: Secondary | ICD-10-CM | POA: Diagnosis not present

## 2017-01-21 DIAGNOSIS — M9901 Segmental and somatic dysfunction of cervical region: Secondary | ICD-10-CM | POA: Diagnosis not present

## 2017-01-21 DIAGNOSIS — M9903 Segmental and somatic dysfunction of lumbar region: Secondary | ICD-10-CM | POA: Diagnosis not present

## 2017-01-28 DIAGNOSIS — M9903 Segmental and somatic dysfunction of lumbar region: Secondary | ICD-10-CM | POA: Diagnosis not present

## 2017-01-28 DIAGNOSIS — M9901 Segmental and somatic dysfunction of cervical region: Secondary | ICD-10-CM | POA: Diagnosis not present

## 2017-01-28 DIAGNOSIS — M9904 Segmental and somatic dysfunction of sacral region: Secondary | ICD-10-CM | POA: Diagnosis not present

## 2017-01-28 DIAGNOSIS — M545 Low back pain: Secondary | ICD-10-CM | POA: Diagnosis not present

## 2017-02-04 DIAGNOSIS — M545 Low back pain: Secondary | ICD-10-CM | POA: Diagnosis not present

## 2017-02-04 DIAGNOSIS — M9904 Segmental and somatic dysfunction of sacral region: Secondary | ICD-10-CM | POA: Diagnosis not present

## 2017-02-04 DIAGNOSIS — M9903 Segmental and somatic dysfunction of lumbar region: Secondary | ICD-10-CM | POA: Diagnosis not present

## 2017-02-04 DIAGNOSIS — M9901 Segmental and somatic dysfunction of cervical region: Secondary | ICD-10-CM | POA: Diagnosis not present

## 2017-02-06 ENCOUNTER — Ambulatory Visit (INDEPENDENT_AMBULATORY_CARE_PROVIDER_SITE_OTHER): Payer: PPO | Admitting: *Deleted

## 2017-02-06 DIAGNOSIS — D682 Hereditary deficiency of other clotting factors: Secondary | ICD-10-CM

## 2017-02-06 DIAGNOSIS — Z7901 Long term (current) use of anticoagulants: Secondary | ICD-10-CM

## 2017-02-06 DIAGNOSIS — Z5181 Encounter for therapeutic drug level monitoring: Secondary | ICD-10-CM

## 2017-02-06 LAB — POCT INR: INR: 2.2

## 2017-02-11 DIAGNOSIS — M9901 Segmental and somatic dysfunction of cervical region: Secondary | ICD-10-CM | POA: Diagnosis not present

## 2017-02-11 DIAGNOSIS — M9903 Segmental and somatic dysfunction of lumbar region: Secondary | ICD-10-CM | POA: Diagnosis not present

## 2017-02-11 DIAGNOSIS — M545 Low back pain: Secondary | ICD-10-CM | POA: Diagnosis not present

## 2017-02-11 DIAGNOSIS — M9904 Segmental and somatic dysfunction of sacral region: Secondary | ICD-10-CM | POA: Diagnosis not present

## 2017-02-18 DIAGNOSIS — M545 Low back pain: Secondary | ICD-10-CM | POA: Diagnosis not present

## 2017-02-18 DIAGNOSIS — M9901 Segmental and somatic dysfunction of cervical region: Secondary | ICD-10-CM | POA: Diagnosis not present

## 2017-02-18 DIAGNOSIS — M9904 Segmental and somatic dysfunction of sacral region: Secondary | ICD-10-CM | POA: Diagnosis not present

## 2017-02-18 DIAGNOSIS — M9903 Segmental and somatic dysfunction of lumbar region: Secondary | ICD-10-CM | POA: Diagnosis not present

## 2017-02-25 DIAGNOSIS — M9904 Segmental and somatic dysfunction of sacral region: Secondary | ICD-10-CM | POA: Diagnosis not present

## 2017-02-25 DIAGNOSIS — M545 Low back pain: Secondary | ICD-10-CM | POA: Diagnosis not present

## 2017-02-25 DIAGNOSIS — M9901 Segmental and somatic dysfunction of cervical region: Secondary | ICD-10-CM | POA: Diagnosis not present

## 2017-02-25 DIAGNOSIS — M9903 Segmental and somatic dysfunction of lumbar region: Secondary | ICD-10-CM | POA: Diagnosis not present

## 2017-03-04 DIAGNOSIS — M545 Low back pain: Secondary | ICD-10-CM | POA: Diagnosis not present

## 2017-03-04 DIAGNOSIS — M9904 Segmental and somatic dysfunction of sacral region: Secondary | ICD-10-CM | POA: Diagnosis not present

## 2017-03-04 DIAGNOSIS — M9903 Segmental and somatic dysfunction of lumbar region: Secondary | ICD-10-CM | POA: Diagnosis not present

## 2017-03-04 DIAGNOSIS — M9901 Segmental and somatic dysfunction of cervical region: Secondary | ICD-10-CM | POA: Diagnosis not present

## 2017-03-13 DIAGNOSIS — H2513 Age-related nuclear cataract, bilateral: Secondary | ICD-10-CM | POA: Diagnosis not present

## 2017-03-13 DIAGNOSIS — H35362 Drusen (degenerative) of macula, left eye: Secondary | ICD-10-CM | POA: Diagnosis not present

## 2017-03-13 DIAGNOSIS — H35032 Hypertensive retinopathy, left eye: Secondary | ICD-10-CM | POA: Diagnosis not present

## 2017-03-13 DIAGNOSIS — I708 Atherosclerosis of other arteries: Secondary | ICD-10-CM | POA: Diagnosis not present

## 2017-03-18 DIAGNOSIS — M545 Low back pain: Secondary | ICD-10-CM | POA: Diagnosis not present

## 2017-03-18 DIAGNOSIS — M9901 Segmental and somatic dysfunction of cervical region: Secondary | ICD-10-CM | POA: Diagnosis not present

## 2017-03-18 DIAGNOSIS — M9903 Segmental and somatic dysfunction of lumbar region: Secondary | ICD-10-CM | POA: Diagnosis not present

## 2017-03-18 DIAGNOSIS — M9904 Segmental and somatic dysfunction of sacral region: Secondary | ICD-10-CM | POA: Diagnosis not present

## 2017-03-20 ENCOUNTER — Ambulatory Visit (INDEPENDENT_AMBULATORY_CARE_PROVIDER_SITE_OTHER): Payer: PPO | Admitting: *Deleted

## 2017-03-20 DIAGNOSIS — Z7901 Long term (current) use of anticoagulants: Secondary | ICD-10-CM

## 2017-03-20 DIAGNOSIS — D682 Hereditary deficiency of other clotting factors: Secondary | ICD-10-CM

## 2017-03-20 DIAGNOSIS — Z5181 Encounter for therapeutic drug level monitoring: Secondary | ICD-10-CM

## 2017-03-20 LAB — POCT INR: INR: 2.2

## 2017-04-03 ENCOUNTER — Other Ambulatory Visit: Payer: Self-pay | Admitting: Cardiology

## 2017-04-06 ENCOUNTER — Ambulatory Visit: Payer: PPO

## 2017-04-10 ENCOUNTER — Ambulatory Visit: Payer: PPO

## 2017-04-23 NOTE — Telephone Encounter (Signed)
error 

## 2017-04-28 ENCOUNTER — Telehealth: Payer: Self-pay

## 2017-04-28 NOTE — Telephone Encounter (Signed)
Called patient to schedule AWV and received no answer. Mailbox full.

## 2017-04-29 DIAGNOSIS — M9904 Segmental and somatic dysfunction of sacral region: Secondary | ICD-10-CM | POA: Diagnosis not present

## 2017-04-29 DIAGNOSIS — M545 Low back pain: Secondary | ICD-10-CM | POA: Diagnosis not present

## 2017-04-29 DIAGNOSIS — M9901 Segmental and somatic dysfunction of cervical region: Secondary | ICD-10-CM | POA: Diagnosis not present

## 2017-04-29 DIAGNOSIS — M9903 Segmental and somatic dysfunction of lumbar region: Secondary | ICD-10-CM | POA: Diagnosis not present

## 2017-05-01 ENCOUNTER — Ambulatory Visit (INDEPENDENT_AMBULATORY_CARE_PROVIDER_SITE_OTHER): Payer: PPO | Admitting: *Deleted

## 2017-05-01 DIAGNOSIS — D682 Hereditary deficiency of other clotting factors: Secondary | ICD-10-CM | POA: Diagnosis not present

## 2017-05-01 DIAGNOSIS — Z5181 Encounter for therapeutic drug level monitoring: Secondary | ICD-10-CM | POA: Diagnosis not present

## 2017-05-01 DIAGNOSIS — Z7901 Long term (current) use of anticoagulants: Secondary | ICD-10-CM | POA: Diagnosis not present

## 2017-05-01 LAB — POCT INR: INR: 2.3

## 2017-05-01 NOTE — Patient Instructions (Signed)
Continue same dosage of 1 tablet everyday. Recheck INR in 6 weeks. Call with any questions if gets any new medications or scheduled for any procedures  [phone]

## 2017-05-08 ENCOUNTER — Other Ambulatory Visit: Payer: Self-pay | Admitting: Cardiology

## 2017-05-12 ENCOUNTER — Other Ambulatory Visit: Payer: Self-pay | Admitting: Cardiology

## 2017-05-12 NOTE — Telephone Encounter (Signed)
REFILL 

## 2017-05-13 DIAGNOSIS — M9901 Segmental and somatic dysfunction of cervical region: Secondary | ICD-10-CM | POA: Diagnosis not present

## 2017-05-13 DIAGNOSIS — M9903 Segmental and somatic dysfunction of lumbar region: Secondary | ICD-10-CM | POA: Diagnosis not present

## 2017-05-13 DIAGNOSIS — M9904 Segmental and somatic dysfunction of sacral region: Secondary | ICD-10-CM | POA: Diagnosis not present

## 2017-05-13 DIAGNOSIS — M545 Low back pain: Secondary | ICD-10-CM | POA: Diagnosis not present

## 2017-06-03 DIAGNOSIS — M9904 Segmental and somatic dysfunction of sacral region: Secondary | ICD-10-CM | POA: Diagnosis not present

## 2017-06-03 DIAGNOSIS — M545 Low back pain: Secondary | ICD-10-CM | POA: Diagnosis not present

## 2017-06-03 DIAGNOSIS — M9901 Segmental and somatic dysfunction of cervical region: Secondary | ICD-10-CM | POA: Diagnosis not present

## 2017-06-03 DIAGNOSIS — M9903 Segmental and somatic dysfunction of lumbar region: Secondary | ICD-10-CM | POA: Diagnosis not present

## 2017-06-04 ENCOUNTER — Telehealth: Payer: Self-pay

## 2017-06-04 NOTE — Telephone Encounter (Signed)
Called to schedule patient for AWV. Left message on voicemail to call back.

## 2017-06-12 ENCOUNTER — Ambulatory Visit (INDEPENDENT_AMBULATORY_CARE_PROVIDER_SITE_OTHER): Payer: PPO | Admitting: Pharmacist

## 2017-06-12 DIAGNOSIS — Z5181 Encounter for therapeutic drug level monitoring: Secondary | ICD-10-CM | POA: Diagnosis not present

## 2017-06-12 DIAGNOSIS — D682 Hereditary deficiency of other clotting factors: Secondary | ICD-10-CM | POA: Diagnosis not present

## 2017-06-12 DIAGNOSIS — Z7901 Long term (current) use of anticoagulants: Secondary | ICD-10-CM

## 2017-06-12 LAB — POCT INR: INR: 2.7

## 2017-06-12 NOTE — Patient Instructions (Signed)
Description   Continue same dosage of 1 tablet every day. Recheck INR in 6 weeks. Call with any questions if gets any new medications or scheduled for any procedures  [phone]    

## 2017-06-17 DIAGNOSIS — M47812 Spondylosis without myelopathy or radiculopathy, cervical region: Secondary | ICD-10-CM | POA: Diagnosis not present

## 2017-06-17 DIAGNOSIS — G44209 Tension-type headache, unspecified, not intractable: Secondary | ICD-10-CM | POA: Diagnosis not present

## 2017-06-17 DIAGNOSIS — M9904 Segmental and somatic dysfunction of sacral region: Secondary | ICD-10-CM | POA: Diagnosis not present

## 2017-06-17 DIAGNOSIS — M545 Low back pain: Secondary | ICD-10-CM | POA: Diagnosis not present

## 2017-06-17 DIAGNOSIS — M542 Cervicalgia: Secondary | ICD-10-CM | POA: Diagnosis not present

## 2017-06-17 DIAGNOSIS — M9901 Segmental and somatic dysfunction of cervical region: Secondary | ICD-10-CM | POA: Diagnosis not present

## 2017-06-17 DIAGNOSIS — M9903 Segmental and somatic dysfunction of lumbar region: Secondary | ICD-10-CM | POA: Diagnosis not present

## 2017-07-01 DIAGNOSIS — M545 Low back pain: Secondary | ICD-10-CM | POA: Diagnosis not present

## 2017-07-01 DIAGNOSIS — M9903 Segmental and somatic dysfunction of lumbar region: Secondary | ICD-10-CM | POA: Diagnosis not present

## 2017-07-01 DIAGNOSIS — M9901 Segmental and somatic dysfunction of cervical region: Secondary | ICD-10-CM | POA: Diagnosis not present

## 2017-07-01 DIAGNOSIS — M9904 Segmental and somatic dysfunction of sacral region: Secondary | ICD-10-CM | POA: Diagnosis not present

## 2017-07-10 DIAGNOSIS — E119 Type 2 diabetes mellitus without complications: Secondary | ICD-10-CM | POA: Diagnosis not present

## 2017-07-10 DIAGNOSIS — E1142 Type 2 diabetes mellitus with diabetic polyneuropathy: Secondary | ICD-10-CM | POA: Diagnosis not present

## 2017-07-10 DIAGNOSIS — B351 Tinea unguium: Secondary | ICD-10-CM | POA: Diagnosis not present

## 2017-07-15 DIAGNOSIS — G44209 Tension-type headache, unspecified, not intractable: Secondary | ICD-10-CM | POA: Diagnosis not present

## 2017-07-15 DIAGNOSIS — M542 Cervicalgia: Secondary | ICD-10-CM | POA: Diagnosis not present

## 2017-07-15 DIAGNOSIS — M47812 Spondylosis without myelopathy or radiculopathy, cervical region: Secondary | ICD-10-CM | POA: Diagnosis not present

## 2017-07-15 DIAGNOSIS — M9901 Segmental and somatic dysfunction of cervical region: Secondary | ICD-10-CM | POA: Diagnosis not present

## 2017-07-17 ENCOUNTER — Telehealth: Payer: Self-pay

## 2017-07-17 NOTE — Telephone Encounter (Signed)
Left message for patient to return call to schedule AWV and office visit with Dr. Carlota Raspberry.

## 2017-07-24 ENCOUNTER — Ambulatory Visit (INDEPENDENT_AMBULATORY_CARE_PROVIDER_SITE_OTHER): Payer: PPO | Admitting: *Deleted

## 2017-07-24 DIAGNOSIS — D682 Hereditary deficiency of other clotting factors: Secondary | ICD-10-CM | POA: Diagnosis not present

## 2017-07-24 DIAGNOSIS — Z5181 Encounter for therapeutic drug level monitoring: Secondary | ICD-10-CM

## 2017-07-24 DIAGNOSIS — Z7901 Long term (current) use of anticoagulants: Secondary | ICD-10-CM | POA: Diagnosis not present

## 2017-07-24 LAB — POCT INR: INR: 2.9

## 2017-07-24 NOTE — Patient Instructions (Signed)
Description   Continue same dosage of 1 tablet every day. Recheck INR in 6 weeks. Call with any questions if gets any new medications or scheduled for any procedures  [phone]    

## 2017-07-30 DIAGNOSIS — M9903 Segmental and somatic dysfunction of lumbar region: Secondary | ICD-10-CM | POA: Diagnosis not present

## 2017-07-30 DIAGNOSIS — M9901 Segmental and somatic dysfunction of cervical region: Secondary | ICD-10-CM | POA: Diagnosis not present

## 2017-07-30 DIAGNOSIS — G44209 Tension-type headache, unspecified, not intractable: Secondary | ICD-10-CM | POA: Diagnosis not present

## 2017-07-30 DIAGNOSIS — M47812 Spondylosis without myelopathy or radiculopathy, cervical region: Secondary | ICD-10-CM | POA: Diagnosis not present

## 2017-07-30 DIAGNOSIS — M542 Cervicalgia: Secondary | ICD-10-CM | POA: Diagnosis not present

## 2017-07-30 DIAGNOSIS — M545 Low back pain: Secondary | ICD-10-CM | POA: Diagnosis not present

## 2017-07-30 DIAGNOSIS — M9904 Segmental and somatic dysfunction of sacral region: Secondary | ICD-10-CM | POA: Diagnosis not present

## 2017-08-13 DIAGNOSIS — M9901 Segmental and somatic dysfunction of cervical region: Secondary | ICD-10-CM | POA: Diagnosis not present

## 2017-08-13 DIAGNOSIS — G44209 Tension-type headache, unspecified, not intractable: Secondary | ICD-10-CM | POA: Diagnosis not present

## 2017-08-13 DIAGNOSIS — M47812 Spondylosis without myelopathy or radiculopathy, cervical region: Secondary | ICD-10-CM | POA: Diagnosis not present

## 2017-08-13 DIAGNOSIS — M542 Cervicalgia: Secondary | ICD-10-CM | POA: Diagnosis not present

## 2017-08-14 ENCOUNTER — Other Ambulatory Visit: Payer: Self-pay | Admitting: Cardiology

## 2017-08-19 ENCOUNTER — Other Ambulatory Visit: Payer: Self-pay | Admitting: Cardiology

## 2017-09-03 DIAGNOSIS — M47814 Spondylosis without myelopathy or radiculopathy, thoracic region: Secondary | ICD-10-CM | POA: Diagnosis not present

## 2017-09-03 DIAGNOSIS — M9902 Segmental and somatic dysfunction of thoracic region: Secondary | ICD-10-CM | POA: Diagnosis not present

## 2017-09-03 DIAGNOSIS — M546 Pain in thoracic spine: Secondary | ICD-10-CM | POA: Diagnosis not present

## 2017-09-03 DIAGNOSIS — M9903 Segmental and somatic dysfunction of lumbar region: Secondary | ICD-10-CM | POA: Diagnosis not present

## 2017-09-04 ENCOUNTER — Ambulatory Visit (INDEPENDENT_AMBULATORY_CARE_PROVIDER_SITE_OTHER): Payer: PPO | Admitting: *Deleted

## 2017-09-04 DIAGNOSIS — Z7901 Long term (current) use of anticoagulants: Secondary | ICD-10-CM | POA: Diagnosis not present

## 2017-09-04 DIAGNOSIS — D682 Hereditary deficiency of other clotting factors: Secondary | ICD-10-CM

## 2017-09-04 DIAGNOSIS — Z5181 Encounter for therapeutic drug level monitoring: Secondary | ICD-10-CM

## 2017-09-04 LAB — POCT INR: INR: 2.7

## 2017-09-04 NOTE — Patient Instructions (Signed)
Description   Continue same dosage of 1 tablet every day. Recheck INR in 6 weeks. Call with any questions if gets any new medications or scheduled for any procedures  [phone]    

## 2017-09-07 DIAGNOSIS — M47814 Spondylosis without myelopathy or radiculopathy, thoracic region: Secondary | ICD-10-CM | POA: Diagnosis not present

## 2017-09-07 DIAGNOSIS — M9903 Segmental and somatic dysfunction of lumbar region: Secondary | ICD-10-CM | POA: Diagnosis not present

## 2017-09-07 DIAGNOSIS — M546 Pain in thoracic spine: Secondary | ICD-10-CM | POA: Diagnosis not present

## 2017-09-07 DIAGNOSIS — M9902 Segmental and somatic dysfunction of thoracic region: Secondary | ICD-10-CM | POA: Diagnosis not present

## 2017-09-09 DIAGNOSIS — M546 Pain in thoracic spine: Secondary | ICD-10-CM | POA: Diagnosis not present

## 2017-09-09 DIAGNOSIS — M47814 Spondylosis without myelopathy or radiculopathy, thoracic region: Secondary | ICD-10-CM | POA: Diagnosis not present

## 2017-09-09 DIAGNOSIS — M9903 Segmental and somatic dysfunction of lumbar region: Secondary | ICD-10-CM | POA: Diagnosis not present

## 2017-09-09 DIAGNOSIS — M9902 Segmental and somatic dysfunction of thoracic region: Secondary | ICD-10-CM | POA: Diagnosis not present

## 2017-09-10 DIAGNOSIS — M47814 Spondylosis without myelopathy or radiculopathy, thoracic region: Secondary | ICD-10-CM | POA: Diagnosis not present

## 2017-09-10 DIAGNOSIS — M9902 Segmental and somatic dysfunction of thoracic region: Secondary | ICD-10-CM | POA: Diagnosis not present

## 2017-09-10 DIAGNOSIS — M9903 Segmental and somatic dysfunction of lumbar region: Secondary | ICD-10-CM | POA: Diagnosis not present

## 2017-09-10 DIAGNOSIS — M546 Pain in thoracic spine: Secondary | ICD-10-CM | POA: Diagnosis not present

## 2017-09-14 DIAGNOSIS — M47814 Spondylosis without myelopathy or radiculopathy, thoracic region: Secondary | ICD-10-CM | POA: Diagnosis not present

## 2017-09-14 DIAGNOSIS — M9902 Segmental and somatic dysfunction of thoracic region: Secondary | ICD-10-CM | POA: Diagnosis not present

## 2017-09-14 DIAGNOSIS — M9903 Segmental and somatic dysfunction of lumbar region: Secondary | ICD-10-CM | POA: Diagnosis not present

## 2017-09-14 DIAGNOSIS — M546 Pain in thoracic spine: Secondary | ICD-10-CM | POA: Diagnosis not present

## 2017-09-15 ENCOUNTER — Other Ambulatory Visit: Payer: Self-pay | Admitting: Cardiology

## 2017-09-15 NOTE — Telephone Encounter (Signed)
Rx has been sent to the pharmacy electronically. ° °

## 2017-09-16 ENCOUNTER — Other Ambulatory Visit: Payer: Self-pay | Admitting: *Deleted

## 2017-09-16 DIAGNOSIS — M546 Pain in thoracic spine: Secondary | ICD-10-CM | POA: Diagnosis not present

## 2017-09-16 DIAGNOSIS — M9903 Segmental and somatic dysfunction of lumbar region: Secondary | ICD-10-CM | POA: Diagnosis not present

## 2017-09-16 DIAGNOSIS — M9902 Segmental and somatic dysfunction of thoracic region: Secondary | ICD-10-CM | POA: Diagnosis not present

## 2017-09-16 DIAGNOSIS — M47814 Spondylosis without myelopathy or radiculopathy, thoracic region: Secondary | ICD-10-CM | POA: Diagnosis not present

## 2017-09-16 MED ORDER — DILTIAZEM HCL ER BEADS 300 MG PO CP24: 300.0000 mg | ORAL_CAPSULE | Freq: Every day | ORAL | 0 refills | Status: DC

## 2017-09-16 NOTE — Telephone Encounter (Signed)
Patient left a msg on the refill vm requesting a refill on diltiazem.

## 2017-09-17 DIAGNOSIS — M546 Pain in thoracic spine: Secondary | ICD-10-CM | POA: Diagnosis not present

## 2017-09-17 DIAGNOSIS — M9902 Segmental and somatic dysfunction of thoracic region: Secondary | ICD-10-CM | POA: Diagnosis not present

## 2017-09-17 DIAGNOSIS — M9903 Segmental and somatic dysfunction of lumbar region: Secondary | ICD-10-CM | POA: Diagnosis not present

## 2017-09-17 DIAGNOSIS — M47814 Spondylosis without myelopathy or radiculopathy, thoracic region: Secondary | ICD-10-CM | POA: Diagnosis not present

## 2017-10-07 NOTE — Progress Notes (Signed)
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 He is on chronic Coumadin. Abdominal CT 2011 showed no aneurysm. Found to be in atrial fibrillation on previous electrocardiogram. TSH 2.88. Patient placed on Cardizem for rate control. Had successful DCCV 11/22/15. However atrial fibrillation recurred. Holter monitor September 2017 showed atrial fibrillation with PVCs or aberrantly conducted beats. Cardizem increased to 300 mg daily. Echo 2/18 showed EF 45-50, mildly dilated aortic root and mild LAE. Nuclear study 2/18 showed no no ischemia or infarction. Since last seen, patient occasionally has some dyspnea on exertion but no orthopnea, PND, palpitations, syncope, chest pain or bleeding.  He does have chronic pedal edema which worsens when he flys.    Current Outpatient Medications  Medication Sig Dispense Refill  . diltiazem (TIAZAC) 300 MG 24 hr capsule Take 1 capsule (300 mg total) by mouth daily. 90 capsule 0  . traMADol (ULTRAM) 50 MG tablet Take 1 tablet (50 mg total) by mouth every 6 (six) hours as needed. 20 tablet 0  . warfarin (COUMADIN) 5 MG tablet TAKE 1 TABLET BY MOUTH ONCE DAILY OR  AS  DIRECTED  BY  COUMADIN  CLINIC 90 tablet 0   No current facility-administered medications for this visit.      Past Medical History:  Diagnosis Date  . Arthritis   . BENIGN PROSTATIC HYPERTROPHY, WITH OBSTRUCTION   . Cancer (HCC)    skin, basal, squamous  . COLONIC POLYPS   . Diverticulitis   . DVT (deep venous thrombosis) ([place]) 2000  . Dysrhythmia    Hx Afib- 2017  . Factor 5 Leiden mutation, heterozygous ([place])   . GERD (gastroesophageal reflux disease)    not on medication  . HIATAL HERNIA   . ITP (idiopathic thrombocytopenic purpura) 2003  . Long term current use of anticoagulant   . PSORIASIS   . Pulmonary embolus ([place]) 2000  . Shortness of breath dyspnea    at times - Talking alot  . Sleep apnea     Past Surgical History:    Procedure Laterality Date  . CARDIOVERSION N/A 11/22/2015   Procedure: CARDIOVERSION;  Surgeon: Sanda Klein, MD;  [facility];  Service: Cardiovascular;  Laterality: N/A;  . COLONOSCOPY    . HERNIA REPAIR Left    Inguinal- x2 . mesh x1  . INSERTION OF MESH N/A 02/25/2016   Procedure: INSERTION OF MESH;  Surgeon: Rolm Bookbinder, MD;  Location: [facility];  Service: General;  Laterality: N/A;  . LUNG REMOVAL, PARTIAL Right 2004  . PROSTATE SURGERY    . UMBILICAL HERNIA REPAIR N/A 02/25/2016   Procedure: LAPAROSCOPIC UMBILICAL HERNIA REPAIR;  Surgeon: Rolm Bookbinder, MD;  Location: [facility];  Service: General;  Laterality: N/A;    Social History   Socioeconomic History  . Marital status: Divorced    Spouse name: Not on file  . Number of children: 2  . Years of education: Not on file  . Highest education level: Not on file  Occupational History    Employer: RETIRED  Social Needs  . Financial resource strain: Not on file  . Food insecurity:    Worry: Not on file    Inability: Not on file  . Transportation needs:    Medical: Not on file    Non-medical: Not on file  Tobacco Use  . Smoking status: Former Smoker    Packs/day: 1.00    Last attempt to quit: 06/16/1985    Years  since quitting: 32.3  . Smokeless tobacco: Never Used  . Tobacco comment: quit approx age 73-   Substance and Sexual Activity  . Alcohol use: No  . Drug use: No  . Sexual activity: Yes  Lifestyle  . Physical activity:    Days per week: Not on file    Minutes per session: Not on file  . Stress: Not on file  Relationships  . Social connections:    Talks on phone: Not on file    Gets together: Not on file    Attends religious service: Not on file    Active member of club or organization: Not on file    Attends meetings of clubs or organizations: Not on file    Relationship status: Not on file  . Intimate partner violence:    Fear of current or ex partner: Not on file    Emotionally abused:  Not on file    Physically abused: Not on file    Forced sexual activity: Not on file  Other Topics Concern  . Not on file  Social History Narrative  . Not on file    Family History  Problem Relation Age of Onset  . Cancer Father   . Cancer Sister   . Heart disease Brother   . Cancer Maternal Grandmother   . Stroke Paternal Grandfather     ROS: no fevers or chills, productive cough, hemoptysis, dysphasia, odynophagia, melena, hematochezia, dysuria, hematuria, rash, seizure activity, orthopnea, PND, claudication. Remaining systems are negative.  Physical Exam: Well-developed obese in no acute distress.  Skin is warm and dry.  HEENT is normal.  Neck is supple.  Chest is clear to auscultation with normal expansion.  Cardiovascular exam is irregular Abdominal exam nontender or distended. No masses palpated. Extremities show 1+ edema. neuro grossly intact  ECG-atrial fibrillation at a rate of 77.  Cannot rule out prior inferior infarct.  Personally reviewed  A/P  1 permanent atrial fibrillation-we have elected rate control and anticoagulation.  Continue present dose of Cardizem.  Continue Coumadin.  2 cardiomyopathy-mildly reduced on prior echocardiogram.  Nuclear study showed no ischemia.  Question related to atrial fibrillation with elevated rate in the past.  Continue Cardizem at present dose.  I would likely repeat echocardiogram when he returns in 1 year.  3 factor V Leiden-continue Coumadin.  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  4 chronic pedal edema-we discussed adding low-dose diuretic today but he would prefer to avoid at this point.  We will add in the future as needed.  Kirk Ruths, MD

## 2017-10-13 DIAGNOSIS — B351 Tinea unguium: Secondary | ICD-10-CM | POA: Diagnosis not present

## 2017-10-13 DIAGNOSIS — M79675 Pain in left toe(s): Secondary | ICD-10-CM | POA: Diagnosis not present

## 2017-10-13 DIAGNOSIS — M79674 Pain in right toe(s): Secondary | ICD-10-CM | POA: Diagnosis not present

## 2017-10-14 ENCOUNTER — Encounter: Payer: Self-pay | Admitting: Cardiology

## 2017-10-14 ENCOUNTER — Ambulatory Visit (INDEPENDENT_AMBULATORY_CARE_PROVIDER_SITE_OTHER): Payer: PPO | Admitting: Cardiology

## 2017-10-14 VITALS — BP 112/77 | HR 93 | Ht 72.0 in | Wt 255.0 lb

## 2017-10-14 DIAGNOSIS — I429 Cardiomyopathy, unspecified: Secondary | ICD-10-CM

## 2017-10-14 DIAGNOSIS — I480 Paroxysmal atrial fibrillation: Secondary | ICD-10-CM | POA: Diagnosis not present

## 2017-10-14 DIAGNOSIS — R609 Edema, unspecified: Secondary | ICD-10-CM

## 2017-10-14 NOTE — Patient Instructions (Signed)
Your physician wants you to follow-up in: ONE YEAR WITH DR CRENSHAW You will receive a reminder letter in the mail two months in advance. If you don't receive a letter, please call our office to schedule the follow-up appointment.   If you need a refill on your cardiac medications before your next appointment, please call your pharmacy.  

## 2017-10-15 DIAGNOSIS — M9903 Segmental and somatic dysfunction of lumbar region: Secondary | ICD-10-CM | POA: Diagnosis not present

## 2017-10-15 DIAGNOSIS — M9902 Segmental and somatic dysfunction of thoracic region: Secondary | ICD-10-CM | POA: Diagnosis not present

## 2017-10-15 DIAGNOSIS — M47814 Spondylosis without myelopathy or radiculopathy, thoracic region: Secondary | ICD-10-CM | POA: Diagnosis not present

## 2017-10-15 DIAGNOSIS — M546 Pain in thoracic spine: Secondary | ICD-10-CM | POA: Diagnosis not present

## 2017-10-16 ENCOUNTER — Ambulatory Visit (INDEPENDENT_AMBULATORY_CARE_PROVIDER_SITE_OTHER): Payer: PPO | Admitting: Pharmacist

## 2017-10-16 DIAGNOSIS — Z5181 Encounter for therapeutic drug level monitoring: Secondary | ICD-10-CM | POA: Diagnosis not present

## 2017-10-16 DIAGNOSIS — D682 Hereditary deficiency of other clotting factors: Secondary | ICD-10-CM | POA: Diagnosis not present

## 2017-10-16 DIAGNOSIS — Z7901 Long term (current) use of anticoagulants: Secondary | ICD-10-CM

## 2017-10-16 LAB — POCT INR: INR: 5.1

## 2017-10-16 NOTE — Patient Instructions (Signed)
Description   Skip your Coumadin today and tomorrow, then continue same dosage of 1 tablet every day. Recheck INR in 2 weeks. Call with any questions if gets any new medications or scheduled for any procedures  [phone]

## 2017-10-22 DIAGNOSIS — M47814 Spondylosis without myelopathy or radiculopathy, thoracic region: Secondary | ICD-10-CM | POA: Diagnosis not present

## 2017-10-22 DIAGNOSIS — M9903 Segmental and somatic dysfunction of lumbar region: Secondary | ICD-10-CM | POA: Diagnosis not present

## 2017-10-22 DIAGNOSIS — M546 Pain in thoracic spine: Secondary | ICD-10-CM | POA: Diagnosis not present

## 2017-10-22 DIAGNOSIS — M9902 Segmental and somatic dysfunction of thoracic region: Secondary | ICD-10-CM | POA: Diagnosis not present

## 2017-10-26 DIAGNOSIS — M9902 Segmental and somatic dysfunction of thoracic region: Secondary | ICD-10-CM | POA: Diagnosis not present

## 2017-10-26 DIAGNOSIS — M47814 Spondylosis without myelopathy or radiculopathy, thoracic region: Secondary | ICD-10-CM | POA: Diagnosis not present

## 2017-10-26 DIAGNOSIS — M546 Pain in thoracic spine: Secondary | ICD-10-CM | POA: Diagnosis not present

## 2017-10-26 DIAGNOSIS — M9903 Segmental and somatic dysfunction of lumbar region: Secondary | ICD-10-CM | POA: Diagnosis not present

## 2017-10-30 ENCOUNTER — Ambulatory Visit (INDEPENDENT_AMBULATORY_CARE_PROVIDER_SITE_OTHER): Payer: PPO | Admitting: *Deleted

## 2017-10-30 DIAGNOSIS — Z7901 Long term (current) use of anticoagulants: Secondary | ICD-10-CM | POA: Diagnosis not present

## 2017-10-30 DIAGNOSIS — D682 Hereditary deficiency of other clotting factors: Secondary | ICD-10-CM | POA: Diagnosis not present

## 2017-10-30 DIAGNOSIS — Z5181 Encounter for therapeutic drug level monitoring: Secondary | ICD-10-CM | POA: Diagnosis not present

## 2017-10-30 LAB — POCT INR: INR: 2.2

## 2017-10-30 NOTE — Patient Instructions (Signed)
Description   Continue same dosage of 1 tablet every day. Recheck INR in 4 weeks. Call with any questions if gets any new medications or scheduled for any procedures  [phone]

## 2017-11-12 DIAGNOSIS — M9903 Segmental and somatic dysfunction of lumbar region: Secondary | ICD-10-CM | POA: Diagnosis not present

## 2017-11-12 DIAGNOSIS — M47814 Spondylosis without myelopathy or radiculopathy, thoracic region: Secondary | ICD-10-CM | POA: Diagnosis not present

## 2017-11-12 DIAGNOSIS — M9902 Segmental and somatic dysfunction of thoracic region: Secondary | ICD-10-CM | POA: Diagnosis not present

## 2017-11-12 DIAGNOSIS — M546 Pain in thoracic spine: Secondary | ICD-10-CM | POA: Diagnosis not present

## 2017-11-16 DIAGNOSIS — M9903 Segmental and somatic dysfunction of lumbar region: Secondary | ICD-10-CM | POA: Diagnosis not present

## 2017-11-16 DIAGNOSIS — M546 Pain in thoracic spine: Secondary | ICD-10-CM | POA: Diagnosis not present

## 2017-11-16 DIAGNOSIS — M9902 Segmental and somatic dysfunction of thoracic region: Secondary | ICD-10-CM | POA: Diagnosis not present

## 2017-11-16 DIAGNOSIS — M47814 Spondylosis without myelopathy or radiculopathy, thoracic region: Secondary | ICD-10-CM | POA: Diagnosis not present

## 2017-11-17 DIAGNOSIS — M47814 Spondylosis without myelopathy or radiculopathy, thoracic region: Secondary | ICD-10-CM | POA: Diagnosis not present

## 2017-11-17 DIAGNOSIS — M9902 Segmental and somatic dysfunction of thoracic region: Secondary | ICD-10-CM | POA: Diagnosis not present

## 2017-11-17 DIAGNOSIS — M546 Pain in thoracic spine: Secondary | ICD-10-CM | POA: Diagnosis not present

## 2017-11-17 DIAGNOSIS — M9903 Segmental and somatic dysfunction of lumbar region: Secondary | ICD-10-CM | POA: Diagnosis not present

## 2017-11-19 DIAGNOSIS — M9902 Segmental and somatic dysfunction of thoracic region: Secondary | ICD-10-CM | POA: Diagnosis not present

## 2017-11-19 DIAGNOSIS — M546 Pain in thoracic spine: Secondary | ICD-10-CM | POA: Diagnosis not present

## 2017-11-19 DIAGNOSIS — M9903 Segmental and somatic dysfunction of lumbar region: Secondary | ICD-10-CM | POA: Diagnosis not present

## 2017-11-19 DIAGNOSIS — M47814 Spondylosis without myelopathy or radiculopathy, thoracic region: Secondary | ICD-10-CM | POA: Diagnosis not present

## 2017-11-23 DIAGNOSIS — M4692 Unspecified inflammatory spondylopathy, cervical region: Secondary | ICD-10-CM | POA: Diagnosis not present

## 2017-11-23 DIAGNOSIS — H02831 Dermatochalasis of right upper eyelid: Secondary | ICD-10-CM | POA: Diagnosis not present

## 2017-11-23 DIAGNOSIS — H0279 Other degenerative disorders of eyelid and periocular area: Secondary | ICD-10-CM | POA: Diagnosis not present

## 2017-11-23 DIAGNOSIS — H02834 Dermatochalasis of left upper eyelid: Secondary | ICD-10-CM | POA: Diagnosis not present

## 2017-11-23 DIAGNOSIS — M546 Pain in thoracic spine: Secondary | ICD-10-CM | POA: Diagnosis not present

## 2017-11-23 DIAGNOSIS — M9902 Segmental and somatic dysfunction of thoracic region: Secondary | ICD-10-CM | POA: Diagnosis not present

## 2017-11-23 DIAGNOSIS — H53483 Generalized contraction of visual field, bilateral: Secondary | ICD-10-CM | POA: Diagnosis not present

## 2017-11-23 DIAGNOSIS — M47814 Spondylosis without myelopathy or radiculopathy, thoracic region: Secondary | ICD-10-CM | POA: Diagnosis not present

## 2017-11-23 DIAGNOSIS — H02423 Myogenic ptosis of bilateral eyelids: Secondary | ICD-10-CM | POA: Diagnosis not present

## 2017-11-23 DIAGNOSIS — H02413 Mechanical ptosis of bilateral eyelids: Secondary | ICD-10-CM | POA: Diagnosis not present

## 2017-11-23 DIAGNOSIS — H57813 Brow ptosis, bilateral: Secondary | ICD-10-CM | POA: Diagnosis not present

## 2017-11-23 DIAGNOSIS — M9903 Segmental and somatic dysfunction of lumbar region: Secondary | ICD-10-CM | POA: Diagnosis not present

## 2017-11-25 DIAGNOSIS — M546 Pain in thoracic spine: Secondary | ICD-10-CM | POA: Diagnosis not present

## 2017-11-25 DIAGNOSIS — M9902 Segmental and somatic dysfunction of thoracic region: Secondary | ICD-10-CM | POA: Diagnosis not present

## 2017-11-25 DIAGNOSIS — M9903 Segmental and somatic dysfunction of lumbar region: Secondary | ICD-10-CM | POA: Diagnosis not present

## 2017-11-25 DIAGNOSIS — M47814 Spondylosis without myelopathy or radiculopathy, thoracic region: Secondary | ICD-10-CM | POA: Diagnosis not present

## 2017-11-26 DIAGNOSIS — M47814 Spondylosis without myelopathy or radiculopathy, thoracic region: Secondary | ICD-10-CM | POA: Diagnosis not present

## 2017-11-26 DIAGNOSIS — M9902 Segmental and somatic dysfunction of thoracic region: Secondary | ICD-10-CM | POA: Diagnosis not present

## 2017-11-26 DIAGNOSIS — M546 Pain in thoracic spine: Secondary | ICD-10-CM | POA: Diagnosis not present

## 2017-11-26 DIAGNOSIS — M9903 Segmental and somatic dysfunction of lumbar region: Secondary | ICD-10-CM | POA: Diagnosis not present

## 2017-11-27 ENCOUNTER — Ambulatory Visit (INDEPENDENT_AMBULATORY_CARE_PROVIDER_SITE_OTHER): Payer: PPO | Admitting: *Deleted

## 2017-11-27 DIAGNOSIS — I481 Persistent atrial fibrillation: Secondary | ICD-10-CM

## 2017-11-27 DIAGNOSIS — R4781 Slurred speech: Secondary | ICD-10-CM | POA: Diagnosis not present

## 2017-11-27 DIAGNOSIS — I4819 Other persistent atrial fibrillation: Secondary | ICD-10-CM

## 2017-11-27 DIAGNOSIS — Z5181 Encounter for therapeutic drug level monitoring: Secondary | ICD-10-CM | POA: Diagnosis not present

## 2017-11-27 DIAGNOSIS — Z86711 Personal history of pulmonary embolism: Secondary | ICD-10-CM | POA: Diagnosis not present

## 2017-11-27 DIAGNOSIS — I482 Chronic atrial fibrillation: Secondary | ICD-10-CM | POA: Diagnosis not present

## 2017-11-27 DIAGNOSIS — R2689 Other abnormalities of gait and mobility: Secondary | ICD-10-CM | POA: Diagnosis not present

## 2017-11-27 DIAGNOSIS — D6851 Activated protein C resistance: Secondary | ICD-10-CM | POA: Diagnosis not present

## 2017-11-27 DIAGNOSIS — Z7901 Long term (current) use of anticoagulants: Secondary | ICD-10-CM | POA: Diagnosis not present

## 2017-11-27 LAB — POCT INR: INR: 2.1 (ref 2.0–3.0)

## 2017-11-27 NOTE — Patient Instructions (Signed)
Description   Continue same dosage of 1 tablet every day. Recheck INR in 4 weeks. Call with any questions if gets any new medications or scheduled for any procedures  [phone]

## 2017-11-28 ENCOUNTER — Other Ambulatory Visit: Payer: Self-pay | Admitting: Physician Assistant

## 2017-11-28 DIAGNOSIS — R2689 Other abnormalities of gait and mobility: Secondary | ICD-10-CM

## 2017-11-28 DIAGNOSIS — R4781 Slurred speech: Secondary | ICD-10-CM

## 2017-11-30 DIAGNOSIS — M47814 Spondylosis without myelopathy or radiculopathy, thoracic region: Secondary | ICD-10-CM | POA: Diagnosis not present

## 2017-11-30 DIAGNOSIS — M9903 Segmental and somatic dysfunction of lumbar region: Secondary | ICD-10-CM | POA: Diagnosis not present

## 2017-11-30 DIAGNOSIS — M546 Pain in thoracic spine: Secondary | ICD-10-CM | POA: Diagnosis not present

## 2017-11-30 DIAGNOSIS — M9902 Segmental and somatic dysfunction of thoracic region: Secondary | ICD-10-CM | POA: Diagnosis not present

## 2017-12-01 ENCOUNTER — Other Ambulatory Visit: Payer: Self-pay | Admitting: Cardiology

## 2017-12-02 DIAGNOSIS — M546 Pain in thoracic spine: Secondary | ICD-10-CM | POA: Diagnosis not present

## 2017-12-02 DIAGNOSIS — M9902 Segmental and somatic dysfunction of thoracic region: Secondary | ICD-10-CM | POA: Diagnosis not present

## 2017-12-02 DIAGNOSIS — M47814 Spondylosis without myelopathy or radiculopathy, thoracic region: Secondary | ICD-10-CM | POA: Diagnosis not present

## 2017-12-02 DIAGNOSIS — M9903 Segmental and somatic dysfunction of lumbar region: Secondary | ICD-10-CM | POA: Diagnosis not present

## 2017-12-03 DIAGNOSIS — M546 Pain in thoracic spine: Secondary | ICD-10-CM | POA: Diagnosis not present

## 2017-12-03 DIAGNOSIS — M9902 Segmental and somatic dysfunction of thoracic region: Secondary | ICD-10-CM | POA: Diagnosis not present

## 2017-12-03 DIAGNOSIS — M47814 Spondylosis without myelopathy or radiculopathy, thoracic region: Secondary | ICD-10-CM | POA: Diagnosis not present

## 2017-12-03 DIAGNOSIS — M9903 Segmental and somatic dysfunction of lumbar region: Secondary | ICD-10-CM | POA: Diagnosis not present

## 2017-12-07 DIAGNOSIS — M9903 Segmental and somatic dysfunction of lumbar region: Secondary | ICD-10-CM | POA: Diagnosis not present

## 2017-12-07 DIAGNOSIS — M546 Pain in thoracic spine: Secondary | ICD-10-CM | POA: Diagnosis not present

## 2017-12-07 DIAGNOSIS — M9902 Segmental and somatic dysfunction of thoracic region: Secondary | ICD-10-CM | POA: Diagnosis not present

## 2017-12-07 DIAGNOSIS — M47814 Spondylosis without myelopathy or radiculopathy, thoracic region: Secondary | ICD-10-CM | POA: Diagnosis not present

## 2017-12-09 ENCOUNTER — Ambulatory Visit
Admission: RE | Admit: 2017-12-09 | Discharge: 2017-12-09 | Disposition: A | Discharge status: Home / Self Care | Payer: PPO | Source: Ambulatory Visit | Attending: Physician Assistant | Admitting: Physician Assistant

## 2017-12-09 DIAGNOSIS — R2689 Other abnormalities of gait and mobility: Secondary | ICD-10-CM

## 2017-12-09 DIAGNOSIS — R4781 Slurred speech: Secondary | ICD-10-CM

## 2017-12-09 DIAGNOSIS — M9902 Segmental and somatic dysfunction of thoracic region: Secondary | ICD-10-CM | POA: Diagnosis not present

## 2017-12-09 DIAGNOSIS — M9903 Segmental and somatic dysfunction of lumbar region: Secondary | ICD-10-CM | POA: Diagnosis not present

## 2017-12-09 DIAGNOSIS — M47814 Spondylosis without myelopathy or radiculopathy, thoracic region: Secondary | ICD-10-CM | POA: Diagnosis not present

## 2017-12-09 DIAGNOSIS — M546 Pain in thoracic spine: Secondary | ICD-10-CM | POA: Diagnosis not present

## 2017-12-09 MED ORDER — GADOBENATE DIMEGLUMINE 529 MG/ML IV SOLN
20.0000 mL | Freq: Once | INTRAVENOUS | Status: AC | PRN
  Administered 2017-12-09: 20 mL via INTRAVENOUS

## 2017-12-10 DIAGNOSIS — M546 Pain in thoracic spine: Secondary | ICD-10-CM | POA: Diagnosis not present

## 2017-12-10 DIAGNOSIS — M9903 Segmental and somatic dysfunction of lumbar region: Secondary | ICD-10-CM | POA: Diagnosis not present

## 2017-12-10 DIAGNOSIS — M9902 Segmental and somatic dysfunction of thoracic region: Secondary | ICD-10-CM | POA: Diagnosis not present

## 2017-12-10 DIAGNOSIS — M47814 Spondylosis without myelopathy or radiculopathy, thoracic region: Secondary | ICD-10-CM | POA: Diagnosis not present

## 2017-12-22 ENCOUNTER — Encounter (HOSPITAL_COMMUNITY): Payer: Self-pay

## 2017-12-22 ENCOUNTER — Inpatient Hospital Stay (HOSPITAL_COMMUNITY)
Admission: EM | Admit: 2017-12-22 | Discharge: 2017-12-26 | DRG: 871 | Disposition: A | Discharge status: Home Health | Payer: PPO | Attending: Family Medicine | Admitting: Family Medicine

## 2017-12-22 ENCOUNTER — Emergency Department (HOSPITAL_COMMUNITY): Payer: PPO

## 2017-12-22 DIAGNOSIS — I481 Persistent atrial fibrillation: Secondary | ICD-10-CM | POA: Diagnosis not present

## 2017-12-22 DIAGNOSIS — R402142 Coma scale, eyes open, spontaneous, at arrival to emergency department: Secondary | ICD-10-CM | POA: Diagnosis present

## 2017-12-22 DIAGNOSIS — I4819 Other persistent atrial fibrillation: Secondary | ICD-10-CM | POA: Diagnosis present

## 2017-12-22 DIAGNOSIS — G4733 Obstructive sleep apnea (adult) (pediatric): Secondary | ICD-10-CM | POA: Diagnosis not present

## 2017-12-22 DIAGNOSIS — Z7901 Long term (current) use of anticoagulants: Secondary | ICD-10-CM

## 2017-12-22 DIAGNOSIS — K219 Gastro-esophageal reflux disease without esophagitis: Secondary | ICD-10-CM | POA: Diagnosis not present

## 2017-12-22 DIAGNOSIS — D6851 Activated protein C resistance: Secondary | ICD-10-CM | POA: Diagnosis not present

## 2017-12-22 DIAGNOSIS — D693 Immune thrombocytopenic purpura: Secondary | ICD-10-CM | POA: Diagnosis not present

## 2017-12-22 DIAGNOSIS — R000 Tachycardia, unspecified: Secondary | ICD-10-CM | POA: Diagnosis not present

## 2017-12-22 DIAGNOSIS — Z79899 Other long term (current) drug therapy: Secondary | ICD-10-CM

## 2017-12-22 DIAGNOSIS — R509 Fever, unspecified: Secondary | ICD-10-CM | POA: Diagnosis not present

## 2017-12-22 DIAGNOSIS — Z86718 Personal history of other venous thrombosis and embolism: Secondary | ICD-10-CM | POA: Diagnosis not present

## 2017-12-22 DIAGNOSIS — J181 Lobar pneumonia, unspecified organism: Secondary | ICD-10-CM | POA: Diagnosis not present

## 2017-12-22 DIAGNOSIS — R402362 Coma scale, best motor response, obeys commands, at arrival to emergency department: Secondary | ICD-10-CM | POA: Diagnosis not present

## 2017-12-22 DIAGNOSIS — J189 Pneumonia, unspecified organism: Secondary | ICD-10-CM | POA: Diagnosis not present

## 2017-12-22 DIAGNOSIS — R0689 Other abnormalities of breathing: Secondary | ICD-10-CM | POA: Diagnosis not present

## 2017-12-22 DIAGNOSIS — Z902 Acquired absence of lung [part of]: Secondary | ICD-10-CM | POA: Diagnosis not present

## 2017-12-22 DIAGNOSIS — N400 Enlarged prostate without lower urinary tract symptoms: Secondary | ICD-10-CM | POA: Diagnosis not present

## 2017-12-22 DIAGNOSIS — R001 Bradycardia, unspecified: Secondary | ICD-10-CM | POA: Diagnosis not present

## 2017-12-22 DIAGNOSIS — A4151 Sepsis due to Escherichia coli [E. coli]: Secondary | ICD-10-CM | POA: Diagnosis not present

## 2017-12-22 DIAGNOSIS — Z9989 Dependence on other enabling machines and devices: Secondary | ICD-10-CM

## 2017-12-22 DIAGNOSIS — R0902 Hypoxemia: Secondary | ICD-10-CM | POA: Diagnosis not present

## 2017-12-22 DIAGNOSIS — A419 Sepsis, unspecified organism: Secondary | ICD-10-CM

## 2017-12-22 DIAGNOSIS — Z87891 Personal history of nicotine dependence: Secondary | ICD-10-CM | POA: Diagnosis not present

## 2017-12-22 DIAGNOSIS — I482 Chronic atrial fibrillation: Secondary | ICD-10-CM | POA: Diagnosis not present

## 2017-12-22 DIAGNOSIS — R0602 Shortness of breath: Secondary | ICD-10-CM | POA: Diagnosis not present

## 2017-12-22 DIAGNOSIS — R7989 Other specified abnormal findings of blood chemistry: Secondary | ICD-10-CM | POA: Diagnosis not present

## 2017-12-22 DIAGNOSIS — Z86711 Personal history of pulmonary embolism: Secondary | ICD-10-CM

## 2017-12-22 DIAGNOSIS — I4891 Unspecified atrial fibrillation: Secondary | ICD-10-CM | POA: Diagnosis not present

## 2017-12-22 DIAGNOSIS — R402252 Coma scale, best verbal response, oriented, at arrival to emergency department: Secondary | ICD-10-CM | POA: Diagnosis present

## 2017-12-22 LAB — CBC
HCT: 41 % (ref 39.0–52.0)
HEMOGLOBIN: 13.8 g/dL (ref 13.0–17.0)
MCH: 32.9 pg (ref 26.0–34.0)
MCHC: 33.7 g/dL (ref 30.0–36.0)
MCV: 97.6 fL (ref 78.0–100.0)
Platelets: 101 10*3/uL — ABNORMAL LOW (ref 150–400)
RBC: 4.2 MIL/uL — AB (ref 4.22–5.81)
RDW: 14 % (ref 11.5–15.5)
WBC: 6.9 10*3/uL (ref 4.0–10.5)

## 2017-12-22 LAB — PROTIME-INR
INR: 2.96
PROTHROMBIN TIME: 30.6 s — AB (ref 11.4–15.2)

## 2017-12-22 LAB — BASIC METABOLIC PANEL
ANION GAP: 6 (ref 5–15)
BUN: 16 mg/dL (ref 8–23)
CO2: 22 mmol/L (ref 22–32)
Calcium: 8.1 mg/dL — ABNORMAL LOW (ref 8.9–10.3)
Chloride: 110 mmol/L (ref 98–111)
Creatinine, Ser: 1.12 mg/dL (ref 0.61–1.24)
GFR calc Af Amer: >60 mL/min (ref >60)
GLUCOSE: 163 mg/dL — AB (ref 70–99)
Potassium: 3.5 mmol/L (ref 3.5–5.1)
Sodium: 138 mmol/L (ref 135–145)

## 2017-12-22 LAB — I-STAT CG4 LACTIC ACID, ED: Lactic Acid, Venous: 1.79 mmol/L (ref 0.5–1.9)

## 2017-12-22 MED ORDER — IBUPROFEN 400 MG PO TABS
400.0000 mg | ORAL_TABLET | Freq: Once | ORAL | Status: AC
  Administered 2017-12-23: 400 mg via ORAL
  Filled 2017-12-22: qty 1

## 2017-12-22 MED ORDER — SODIUM CHLORIDE 0.9 % IV SOLN
1.0000 g | Freq: Once | INTRAVENOUS | Status: AC
  Administered 2017-12-23: 1 g via INTRAVENOUS
  Filled 2017-12-22: qty 10

## 2017-12-22 MED ORDER — SODIUM CHLORIDE 0.9 % IV BOLUS
1000.0000 mL | Freq: Once | INTRAVENOUS | Status: AC
  Administered 2017-12-23: 1000 mL via INTRAVENOUS

## 2017-12-22 MED ORDER — SODIUM CHLORIDE 0.9 % IV SOLN
INTRAVENOUS | Status: DC | PRN
  Administered 2017-12-23: via INTRAVENOUS

## 2017-12-22 MED ORDER — SODIUM CHLORIDE 0.9 % IV SOLN
500.0000 mg | Freq: Once | INTRAVENOUS | Status: AC
  Administered 2017-12-23: 500 mg via INTRAVENOUS
  Filled 2017-12-22: qty 500

## 2017-12-22 NOTE — ED Provider Notes (Addendum)
[facility] EMERGENCY DEPARTMENT Provider Note   CSN: [id number] Arrival date & time: 12/22/17  2213     History   Chief Complaint Chief Complaint  Patient presents with  . Atrial Fibrillation    HPI Stanley Wright is a 72 y.o. male.  Patient presents to the ER for evaluation of fever.  Patient reports that the fever started yesterday.  He has been running a fever and feeling achy all over, denies cough, shortness of breath, abdominal pain, vomiting, diarrhea.  Patient is concerned because he just got back from [place] 2 days ago and there was a dengue fever outbreak while he was there.  Patient is brought to the emergency department by EMS.  He was noted to be in atrial fibrillation with a rapid ventricular response.  He was administered adenosine with no improvement, then got Lopressor 5 mg IV with significant improvement of his heart rate.  Patient reports that he has a history of atrial fibrillation and is in it chronically.  He takes Coumadin.     Past Medical History:  Diagnosis Date  . Arthritis   . BENIGN PROSTATIC HYPERTROPHY, WITH OBSTRUCTION   . Cancer (HCC)    skin, basal, squamous  . COLONIC POLYPS   . Diverticulitis   . DVT (deep venous thrombosis) ([place]) 2000  . Dysrhythmia    Hx Afib- 2017  . Factor 5 Leiden mutation, heterozygous ([place])   . GERD (gastroesophageal reflux disease)    not on medication  . HIATAL HERNIA   . ITP (idiopathic thrombocytopenic purpura) 2003  . Long term current use of anticoagulant   . PSORIASIS   . Pulmonary embolus ([place]) 2000  . Shortness of breath dyspnea    at times - Talking alot  . Sleep apnea     Patient Active Problem List   Diagnosis Date Noted  . Morbid obesity due to excess calories ([place]) 08/23/2016  . OSA on CPAP 08/23/2016  . Dyspnea on exertion 08/22/2016  . Umbilical hernia 60/03/9322  . Persistent atrial fibrillation ([place])   . Atrial fibrillation ([place]) 10/01/2015  . Encounter for  therapeutic drug monitoring 08/12/2013  . Long term current use of anticoagulant 07/30/2010  . COLONIC POLYPS 06/03/2010  . 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 GERD 06/03/2010  . HIATAL HERNIA 06/03/2010  . BENIGN PROSTATIC HYPERTROPHY, WITH OBSTRUCTION 06/03/2010  . PSORIASIS 06/03/2010    Past Surgical History:  Procedure Laterality Date  . CARDIOVERSION N/A 11/22/2015   Procedure: CARDIOVERSION;  Surgeon: Sanda Klein, MD;  [facility];  Service: Cardiovascular;  Laterality: N/A;  . COLONOSCOPY    . HERNIA REPAIR Left    Inguinal- x2 . mesh x1  . INSERTION OF MESH N/A 02/25/2016   Procedure: INSERTION OF MESH;  Surgeon: Rolm Bookbinder, MD;  Location: [facility];  Service: General;  Laterality: N/A;  . LUNG REMOVAL, PARTIAL Right 2004  . PROSTATE SURGERY    . UMBILICAL HERNIA REPAIR N/A 02/25/2016   Procedure: LAPAROSCOPIC UMBILICAL HERNIA REPAIR;  Surgeon: Rolm Bookbinder, MD;  Location: [facility];  Service: General;  Laterality: N/A;        Home Medications    Prior to Admission medications   Medication Sig Start Date End Date Taking? Authorizing Provider  diltiazem (TIAZAC) 300 MG 24 hr capsule Take 1 capsule (300 mg total) by mouth daily. 09/16/17   Lelon Perla, MD  traMADol (ULTRAM) 50 MG tablet Take 1 tablet (50 mg total) by mouth every 6 (six)  hours as needed. 09/23/16   Wendie Agreste, MD  warfarin (COUMADIN) 5 MG tablet TAKE 1 TABLET BY MOUTH ONCE DAILY OR  AS  DIRECTED  BY  COUMADIN  CLINIC 12/01/17   Lelon Perla, MD    Family History Family History  Problem Relation Age of Onset  . Cancer Father   . Cancer Sister   . Heart disease Brother   . Cancer Maternal Grandmother   . Stroke Paternal Grandfather     Social History Social History   Tobacco Use  . Smoking status: Former Smoker    Packs/day: 1.00    Last attempt to quit: 06/16/1985    Years since quitting: 32.5  . Smokeless tobacco: Never Used  . Tobacco comment: quit approx age 51-    Substance Use Topics  . Alcohol use: No  . Drug use: No     Allergies   Tylenol [acetaminophen]   Review of Systems Review of Systems  Constitutional: Positive for chills and fever.  Musculoskeletal: Positive for myalgias.  All other systems reviewed and are negative.    Physical Exam Updated Vital Signs BP 108/64   Pulse (!) 110   Temp (!) 102 F (38.9 C) (Oral)   Resp (!) 31   Ht 6' (1.829 m)   Wt 117 kg (258 lb)   SpO2 93%   BMI 34.99 kg/m   Physical Exam  Constitutional: He is oriented to person, place, and time. He appears well-developed and well-nourished. No distress.  HENT:  Head: Normocephalic and atraumatic.  Right Ear: Hearing normal.  Left Ear: Hearing normal.  Nose: Nose normal.  Mouth/Throat: Oropharynx is clear and moist and mucous membranes are normal.  Eyes: Pupils are equal, round, and reactive to light. Conjunctivae and EOM are normal.  Neck: Normal range of motion. Neck supple.  Cardiovascular: S1 normal and S2 normal. An irregularly irregular rhythm present. Tachycardia present. Exam reveals no gallop and no friction rub.  No murmur heard. Pulmonary/Chest: Effort normal. Tachypnea noted. No respiratory distress. He has decreased breath sounds. He exhibits no tenderness.  Abdominal: Soft. Normal appearance and bowel sounds are normal. There is no hepatosplenomegaly. There is no tenderness. There is no rebound, no guarding, no tenderness at McBurney's point and negative Murphy's sign. No hernia.  Musculoskeletal: Normal range of motion.  Neurological: He is alert and oriented to person, place, and time. He has normal strength. No cranial nerve deficit or sensory deficit. Coordination normal. GCS eye subscore is 4. GCS verbal subscore is 5. GCS motor subscore is 6.  Skin: Skin is warm, dry and intact. No rash noted. No cyanosis.  Psychiatric: He has a normal mood and affect. His speech is normal and behavior is normal. Thought content normal.    Nursing note and vitals reviewed.    ED Treatments / Results  Labs (all labs ordered are listed, but only abnormal results are displayed) Labs Reviewed  BASIC METABOLIC PANEL - Abnormal; Notable for the following components:      Result Value   Glucose, Bld 163 (*)    Calcium 8.1 (*)    All other components within normal limits  CBC - Abnormal; Notable for the following components:   RBC 4.20 (*)    Platelets 101 (*)    All other components within normal limits  PROTIME-INR - Abnormal; Notable for the following components:   Prothrombin Time 30.6 (*)    All other components within normal limits  CULTURE, BLOOD (ROUTINE X 2)  CULTURE, BLOOD (ROUTINE X 2)  URINE CULTURE  URINALYSIS, ROUTINE W REFLEX MICROSCOPIC  I-STAT CG4 LACTIC ACID, ED    EKG EKG Interpretation  Date/Time:  Tuesday December 22 2017 22:29:59 EDT Ventricular Rate:  106 PR Interval:    QRS Duration: 96 QT Interval:  330 QTC Calculation: 439 R Axis:   -113 Text Interpretation:  Atrial fibrillation Abnormal R-wave progression, late transition Inferior infarct, old Since last tracing afib now present Confirmed by Noemi Chapel [phone]) on 12/22/2017 10:39:26 PM   Radiology Dg Chest Portable 1 View  Result Date: 12/22/2017 CLINICAL DATA:  Shortness of breath.  Atrial fibrillation, fever. EXAM: PORTABLE CHEST 1 VIEW COMPARISON:  Radiograph 09/23/2016 FINDINGS: Unchanged cardiomegaly and mediastinal contours. Patchy retrocardiac consolidation. Streaky right infrahilar opacities. No pulmonary edema. Possible small left pleural effusion. No pneumothorax. Patient's chin obscures the apices. Remote right rib fractures. IMPRESSION: Patchy left lung base opacity may represent pneumonia in the setting of fever. Streaky right infrahilar atelectasis. Consider PA and lateral views when patient is able. Electronically Signed   By: Jeb Levering M.D.   On: 12/22/2017 22:43    Procedures Procedures (including critical care  time)  Medications Ordered in ED Medications  sodium chloride 0.9 % bolus 1,000 mL (has no administration in time range)  cefTRIAXone (ROCEPHIN) 1 g in sodium chloride 0.9 % 100 mL IVPB (has no administration in time range)  azithromycin (ZITHROMAX) 500 mg in sodium chloride 0.9 % 250 mL IVPB (has no administration in time range)  ibuprofen (ADVIL,MOTRIN) tablet 400 mg (has no administration in time range)  0.9 %  sodium chloride infusion (has no administration in time range)     Initial Impression / Assessment and Plan / ED Course  I have reviewed the triage vital signs and the nursing notes.  Pertinent labs & imaging results that were available during my care of the patient were reviewed by me and considered in my medical decision making (see chart for details).     Patient presents to the emergency department for evaluation of fever.  Patient recently traveled to [place] for a mission trip.  He returned home 2 days ago and then the following day developed a fever.  Patient is concerned because there was a dengue fever outbreak where he was.  He has not had any obvious symptoms that would explain his current fever.  He was tachycardic at arrival.  This was after receiving Lopressor for atrial fibrillation with rapid ventricular response.  Patient chronically in A. fib only with rate control.  He takes Coumadin and is therapeutic.  Patient was tachypneic at arrival.  Blood pressures in the low 100s, not clearly hypotensive but this does qualify for sepsis.  Chest x-ray suspicious for right lower lobe pneumonia.  Initiated on community-acquired pneumonia treatment.  Discussed with Dr. Megan Salon, on-call for infectious disease.  Recommends treating the pneumonia, no additional therapy necessary at this time based on the patient's recent travel.  We will admit the patient to hospitalist service for further management of sepsis and pneumonia.  He is currently rate controlled.    CHADs-VASc =  3   Discussed specific testing for dengue fever with lab.  [facility] was contacted.  Specifically requesting codes for RT-PCR and NS 1 testing.  Apparently the individual who would know these codes is not present at night, this will need to be pursued in the morning.  [facility] was able to provide a code for IgG and IgM panel, this was ordered.  CRITICAL  CARE Performed by: Orpah Greek   Total critical care time: 30 minutes  Critical care time was exclusive of separately billable procedures and treating other patients.  Critical care was necessary to treat or prevent imminent or life-threatening deterioration.  Critical care was time spent personally by me on the following activities: development of treatment plan with patient and/or surrogate as well as nursing, discussions with consultants, evaluation of patient's response to treatment, examination of patient, obtaining history from patient or surrogate, ordering and performing treatments and interventions, ordering and review of laboratory studies, ordering and review of radiographic studies, pulse oximetry and re-evaluation of patient's condition.   Final Clinical Impressions(s) / ED Diagnoses   Final diagnoses:  Atrial fibrillation with RVR ([place])  Community acquired pneumonia of right lower lobe of lung ([place])  Sepsis, due to unspecified organism [facility])    ED Discharge Orders    None       Rasheeda Mulvehill, Gwenyth Allegra, MD 12/23/17 0000    Orpah Greek, MD 12/23/17 0113    Orpah Greek, MD 12/23/17 [id number]

## 2017-12-22 NOTE — ED Triage Notes (Signed)
Pt coming from home by ems due c/o Sob when ems arrived pt was in afib with a rate of 180- 200 ems gave 6 of adenosine, then 12 of adenosine which did not help the rate, then was given 5 mg of metoprolol which brought the rate down to 112-115. Pt also just traveled out side the country to [place] and just got back which over there they are having dengue fever going around. Pt at home had a fever of 101.1.

## 2017-12-23 ENCOUNTER — Other Ambulatory Visit: Payer: Self-pay

## 2017-12-23 DIAGNOSIS — Z9989 Dependence on other enabling machines and devices: Secondary | ICD-10-CM | POA: Diagnosis not present

## 2017-12-23 DIAGNOSIS — G4733 Obstructive sleep apnea (adult) (pediatric): Secondary | ICD-10-CM | POA: Diagnosis present

## 2017-12-23 DIAGNOSIS — R509 Fever, unspecified: Secondary | ICD-10-CM | POA: Diagnosis present

## 2017-12-23 DIAGNOSIS — Z79899 Other long term (current) drug therapy: Secondary | ICD-10-CM | POA: Diagnosis not present

## 2017-12-23 DIAGNOSIS — I4891 Unspecified atrial fibrillation: Secondary | ICD-10-CM | POA: Diagnosis not present

## 2017-12-23 DIAGNOSIS — Z87891 Personal history of nicotine dependence: Secondary | ICD-10-CM | POA: Diagnosis not present

## 2017-12-23 DIAGNOSIS — Z902 Acquired absence of lung [part of]: Secondary | ICD-10-CM | POA: Diagnosis not present

## 2017-12-23 DIAGNOSIS — Z86718 Personal history of other venous thrombosis and embolism: Secondary | ICD-10-CM | POA: Diagnosis not present

## 2017-12-23 DIAGNOSIS — R402362 Coma scale, best motor response, obeys commands, at arrival to emergency department: Secondary | ICD-10-CM | POA: Diagnosis present

## 2017-12-23 DIAGNOSIS — D6851 Activated protein C resistance: Secondary | ICD-10-CM | POA: Diagnosis present

## 2017-12-23 DIAGNOSIS — R402252 Coma scale, best verbal response, oriented, at arrival to emergency department: Secondary | ICD-10-CM | POA: Diagnosis present

## 2017-12-23 DIAGNOSIS — R402142 Coma scale, eyes open, spontaneous, at arrival to emergency department: Secondary | ICD-10-CM | POA: Diagnosis present

## 2017-12-23 DIAGNOSIS — D693 Immune thrombocytopenic purpura: Secondary | ICD-10-CM | POA: Diagnosis present

## 2017-12-23 DIAGNOSIS — I481 Persistent atrial fibrillation: Secondary | ICD-10-CM

## 2017-12-23 DIAGNOSIS — J181 Lobar pneumonia, unspecified organism: Secondary | ICD-10-CM

## 2017-12-23 DIAGNOSIS — A419 Sepsis, unspecified organism: Secondary | ICD-10-CM | POA: Diagnosis not present

## 2017-12-23 DIAGNOSIS — N400 Enlarged prostate without lower urinary tract symptoms: Secondary | ICD-10-CM | POA: Diagnosis present

## 2017-12-23 DIAGNOSIS — K219 Gastro-esophageal reflux disease without esophagitis: Secondary | ICD-10-CM | POA: Diagnosis present

## 2017-12-23 DIAGNOSIS — I482 Chronic atrial fibrillation: Secondary | ICD-10-CM | POA: Diagnosis not present

## 2017-12-23 DIAGNOSIS — A4151 Sepsis due to Escherichia coli [E. coli]: Secondary | ICD-10-CM | POA: Diagnosis present

## 2017-12-23 DIAGNOSIS — Z86711 Personal history of pulmonary embolism: Secondary | ICD-10-CM | POA: Diagnosis not present

## 2017-12-23 DIAGNOSIS — J189 Pneumonia, unspecified organism: Secondary | ICD-10-CM | POA: Diagnosis present

## 2017-12-23 DIAGNOSIS — Z7901 Long term (current) use of anticoagulants: Secondary | ICD-10-CM | POA: Diagnosis not present

## 2017-12-23 LAB — URINALYSIS, ROUTINE W REFLEX MICROSCOPIC
BILIRUBIN URINE: NEGATIVE
Bacteria, UA: NONE SEEN
Glucose, UA: NEGATIVE mg/dL
Hgb urine dipstick: NEGATIVE
KETONES UR: NEGATIVE mg/dL
LEUKOCYTES UA: NEGATIVE
Nitrite: NEGATIVE
Protein, ur: 30 mg/dL — AB
SPECIFIC GRAVITY, URINE: 1.032 — AB (ref 1.005–1.030)
pH: 5 (ref 5.0–8.0)

## 2017-12-23 LAB — HEPATIC FUNCTION PANEL
ALT: 70 U/L — ABNORMAL HIGH (ref 0–44)
AST: 75 U/L — ABNORMAL HIGH (ref 15–41)
Albumin: 2.8 g/dL — ABNORMAL LOW (ref 3.5–5.0)
Alkaline Phosphatase: 82 U/L (ref 38–126)
BILIRUBIN DIRECT: 0.9 mg/dL — AB (ref 0.0–0.2)
BILIRUBIN INDIRECT: 0.7 mg/dL (ref 0.3–0.9)
Total Bilirubin: 1.6 mg/dL — ABNORMAL HIGH (ref 0.3–1.2)
Total Protein: 5.5 g/dL — ABNORMAL LOW (ref 6.5–8.1)

## 2017-12-23 LAB — BLOOD CULTURE ID PANEL (REFLEXED)
Acinetobacter baumannii: NOT DETECTED
CANDIDA ALBICANS: NOT DETECTED
CANDIDA KRUSEI: NOT DETECTED
Candida glabrata: NOT DETECTED
Candida parapsilosis: NOT DETECTED
Candida tropicalis: NOT DETECTED
Carbapenem resistance: NOT DETECTED
ENTEROBACTER CLOACAE COMPLEX: NOT DETECTED
ENTEROCOCCUS SPECIES: NOT DETECTED
ESCHERICHIA COLI: DETECTED — AB
Enterobacteriaceae species: DETECTED — AB
Haemophilus influenzae: NOT DETECTED
KLEBSIELLA OXYTOCA: NOT DETECTED
KLEBSIELLA PNEUMONIAE: NOT DETECTED
LISTERIA MONOCYTOGENES: NOT DETECTED
Neisseria meningitidis: NOT DETECTED
PSEUDOMONAS AERUGINOSA: NOT DETECTED
Proteus species: NOT DETECTED
STAPHYLOCOCCUS AUREUS BCID: NOT DETECTED
STREPTOCOCCUS PYOGENES: NOT DETECTED
Serratia marcescens: NOT DETECTED
Staphylococcus species: NOT DETECTED
Streptococcus agalactiae: NOT DETECTED
Streptococcus pneumoniae: NOT DETECTED
Streptococcus species: NOT DETECTED

## 2017-12-23 LAB — CBC
HCT: 40.4 % (ref 39.0–52.0)
HEMOGLOBIN: 13.1 g/dL (ref 13.0–17.0)
MCH: 32.4 pg (ref 26.0–34.0)
MCHC: 32.4 g/dL (ref 30.0–36.0)
MCV: 100 fL (ref 78.0–100.0)
Platelets: 94 10*3/uL — ABNORMAL LOW (ref 150–400)
RBC: 4.04 MIL/uL — AB (ref 4.22–5.81)
RDW: 14.4 % (ref 11.5–15.5)
WBC: 7.3 10*3/uL (ref 4.0–10.5)

## 2017-12-23 LAB — STREP PNEUMONIAE URINARY ANTIGEN: STREP PNEUMO URINARY ANTIGEN: NEGATIVE

## 2017-12-23 LAB — BASIC METABOLIC PANEL
Anion gap: 7 (ref 5–15)
BUN: 13 mg/dL (ref 8–23)
CHLORIDE: 113 mmol/L — AB (ref 98–111)
CO2: 20 mmol/L — AB (ref 22–32)
Calcium: 7.8 mg/dL — ABNORMAL LOW (ref 8.9–10.3)
Creatinine, Ser: 1.03 mg/dL (ref 0.61–1.24)
GFR calc non Af Amer: >60 mL/min (ref >60)
Glucose, Bld: 192 mg/dL — ABNORMAL HIGH (ref 70–99)
POTASSIUM: 3.5 mmol/L (ref 3.5–5.1)
SODIUM: 140 mmol/L (ref 135–145)

## 2017-12-23 LAB — PROTIME-INR
INR: 3.16
PROTHROMBIN TIME: 32.2 s — AB (ref 11.4–15.2)

## 2017-12-23 LAB — HIV ANTIBODY (ROUTINE TESTING W REFLEX): HIV SCREEN 4TH GENERATION: NONREACTIVE

## 2017-12-23 LAB — I-STAT CG4 LACTIC ACID, ED: Lactic Acid, Venous: 1.73 mmol/L (ref 0.5–1.9)

## 2017-12-23 MED ORDER — METOPROLOL TARTRATE 5 MG/5ML IV SOLN
5.0000 mg | Freq: Once | INTRAVENOUS | Status: AC
  Administered 2017-12-23: 5 mg via INTRAVENOUS
  Filled 2017-12-23: qty 5

## 2017-12-23 MED ORDER — SODIUM CHLORIDE 0.9 % IV SOLN: 1.0000 g | INTRAVENOUS | Status: DC

## 2017-12-23 MED ORDER — ACETAMINOPHEN 325 MG PO TABS
650.0000 mg | ORAL_TABLET | Freq: Four times a day (QID) | ORAL | Status: DC | PRN
  Administered 2017-12-23: 650 mg via ORAL
  Filled 2017-12-23 (×2): qty 2

## 2017-12-23 MED ORDER — SODIUM CHLORIDE 0.9 % IV BOLUS
1000.0000 mL | Freq: Once | INTRAVENOUS | Status: AC
  Administered 2017-12-23: 1000 mL via INTRAVENOUS

## 2017-12-23 MED ORDER — SODIUM CHLORIDE 0.9 % IV SOLN
INTRAVENOUS | Status: DC
  Administered 2017-12-23 – 2017-12-25 (×6): via INTRAVENOUS

## 2017-12-23 MED ORDER — SODIUM CHLORIDE 0.9 % IV SOLN
2.0000 g | INTRAVENOUS | Status: DC
  Administered 2017-12-23 – 2017-12-26 (×4): 2 g via INTRAVENOUS
  Filled 2017-12-23 (×5): qty 20

## 2017-12-23 MED ORDER — AZITHROMYCIN 500 MG PO TABS
500.0000 mg | ORAL_TABLET | ORAL | Status: DC
  Administered 2017-12-24 – 2017-12-26 (×3): 500 mg via ORAL
  Filled 2017-12-23 (×3): qty 1

## 2017-12-23 NOTE — Plan of Care (Signed)
  Problem: Education: Goal: Knowledge of General Education information will improve Outcome: Progressing   Problem: Health Behavior/Discharge Planning: Goal: Ability to manage health-related needs will improve Outcome: Progressing   Problem: Clinical Measurements: Goal: Ability to maintain clinical measurements within normal limits will improve Outcome: Progressing   

## 2017-12-23 NOTE — H&P (Signed)
History and Physical    Stanley Wright LEX:[id number] DOB: 1946/06/16 DOA: 12/22/2017  PCP: Wendie Agreste, MD  Patient coming from: Home  I have personally briefly reviewed patient's old medical records in [place]  Chief Complaint: Fever  HPI: Stanley Wright is a 72 y.o. 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  Patient recently returned from [place] x2 days ago where there was an outbreak of dengue.  Patient reports that the fever started yesterday.  He has been running a fever and feeling achy all over, denies cough, shortness of breath, abdominal pain, vomiting, diarrhea.  Brought to ED by EMS.  A.Fib RVR with rate 180, improved with 5mg  IV lopressor.  ED Course: Tm 102.  CXR shows possible early LLL PNA.  INR 2.9.  Platelets 101.  WBC 6.9.  BPs low 100s.  Dr. Megan Salon, on-call for infectious disease.  Recommends treating the pneumonia, no additional therapy necessary at this time based on the patient's recent travel.  Review of Systems: As per HPI otherwise 10 point review of systems negative.   Past Medical History:  Diagnosis Date  . Arthritis   . BENIGN PROSTATIC HYPERTROPHY, WITH OBSTRUCTION   . Cancer (HCC)    skin, basal, squamous  . COLONIC POLYPS   . Diverticulitis   . DVT (deep venous thrombosis) ([place]) 2000  . Dysrhythmia    Hx Afib- 2017  . Factor 5 Leiden mutation, heterozygous ([place])   . GERD (gastroesophageal reflux disease)    not on medication  . HIATAL HERNIA   . ITP (idiopathic thrombocytopenic purpura) 2003  . Long term current use of anticoagulant   . PSORIASIS   . Pulmonary embolus ([place]) 2000  . Shortness of breath dyspnea    at times - Talking alot  . Sleep apnea     Past Surgical History:  Procedure Laterality Date  . CARDIOVERSION N/A 11/22/2015   Procedure: CARDIOVERSION;  Surgeon: Sanda Klein, MD;  Location: [facility];  Service:  Cardiovascular;  Laterality: N/A;  . COLONOSCOPY    . HERNIA REPAIR Left    Inguinal- x2 . mesh x1  . INSERTION OF MESH N/A 02/25/2016   Procedure: INSERTION OF MESH;  Surgeon: Rolm Bookbinder, MD;  Location: [facility];  Service: General;  Laterality: N/A;  . LUNG REMOVAL, PARTIAL Right 2004  . PROSTATE SURGERY    . UMBILICAL HERNIA REPAIR N/A 02/25/2016   Procedure: LAPAROSCOPIC UMBILICAL HERNIA REPAIR;  Surgeon: Rolm Bookbinder, MD;  Location: [facility];  Service: General;  Laterality: N/A;     reports that he quit smoking about 32 years ago. He smoked 1.00 pack per day. He has never used smokeless tobacco. He reports that he does not drink alcohol or use drugs.  Allergies  Allergen Reactions  . Tylenol [Acetaminophen] Other (See Comments)    Pt states he had a DNA test completed that stated he should never take tylenol.    Family History  Problem Relation Age of Onset  . Cancer Father   . Cancer Sister   . Heart disease Brother   . Cancer Maternal Grandmother   . Stroke Paternal Grandfather      Prior to Admission medications   Medication Sig Start Date End Date Taking? Authorizing Provider  diltiazem (TIAZAC) 300 MG 24 hr capsule Take 1 capsule (300 mg total) by mouth daily. 09/16/17   Lelon Perla, MD  traMADol (  ULTRAM) 50 MG tablet Take 1 tablet (50 mg total) by mouth every 6 (six) hours as needed. 09/23/16   Wendie Agreste, MD  warfarin (COUMADIN) 5 MG tablet TAKE 1 TABLET BY MOUTH ONCE DAILY OR  AS  DIRECTED  BY  COUMADIN  CLINIC 12/01/17   Lelon Perla, MD    Physical Exam: Vitals:   12/22/17 2245 12/22/17 2300 12/22/17 2315 12/23/17 0052  BP: 104/73 109/63 108/64   Pulse: (!) 107 (!) 111 (!) 110   Resp: (!) 26 (!) 34 (!) 31   Temp:    99.4 F (37.4 C)  TempSrc:    Oral  SpO2: 93% 93% 93%   Weight:      Height:        Constitutional: NAD, calm, comfortable Eyes: PERRL, lids and conjunctivae normal ENMT: Mucous membranes are moist. Posterior pharynx  clear of any exudate or lesions.Normal dentition.  Neck: normal, supple, no masses, no thyromegaly Respiratory: clear to auscultation bilaterally, no wheezing, no crackles. Normal respiratory effort. No accessory muscle use.  Cardiovascular: Regular rate and rhythm, no murmurs / rubs / gallops. No extremity edema. 2+ pedal pulses. No carotid bruits.  Abdomen: no tenderness, no masses palpated. No hepatosplenomegaly. Bowel sounds positive.  Musculoskeletal: no clubbing / cyanosis. No joint deformity upper and lower extremities. Good ROM, no contractures. Normal muscle tone.  Skin: no rashes, lesions, ulcers. No induration Neurologic: CN 2-12 grossly intact. Sensation intact, DTR normal. Strength 5/5 in all 4.  Psychiatric: Normal judgment and insight. Alert and oriented x 3. Normal mood.    Labs on Admission: I have personally reviewed following labs and imaging studies  CBC: Recent Labs  Lab 12/22/17 2233  WBC 6.9  HGB 13.8  HCT 41.0  MCV 97.6  PLT 628*   Basic Metabolic Panel: Recent Labs  Lab 12/22/17 2233  NA 138  K 3.5  CL 110  CO2 22  GLUCOSE 163*  BUN 16  CREATININE 1.12  CALCIUM 8.1*   GFR: Estimated Creatinine Clearance: 78.8 mL/min (by C-G formula based on SCr of 1.12 mg/dL). Liver Function Tests: No results for input(s): AST, ALT, ALKPHOS, BILITOT, PROT, ALBUMIN in the last 168 hours. No results for input(s): LIPASE, AMYLASE in the last 168 hours. No results for input(s): AMMONIA in the last 168 hours. Coagulation Profile: Recent Labs  Lab 12/22/17 2233  INR 2.96   Cardiac Enzymes: No results for input(s): CKTOTAL, CKMB, CKMBINDEX, TROPONINI in the last 168 hours. BNP (last 3 results) No results for input(s): PROBNP in the last 8760 hours. HbA1C: No results for input(s): HGBA1C in the last 72 hours. CBG: No results for input(s): GLUCAP in the last 168 hours. Lipid Profile: No results for input(s): CHOL, HDL, LDLCALC, TRIG, CHOLHDL, LDLDIRECT in the  last 72 hours. Thyroid Function Tests: No results for input(s): TSH, T4TOTAL, FREET4, T3FREE, THYROIDAB in the last 72 hours. Anemia Panel: No results for input(s): VITAMINB12, FOLATE, FERRITIN, TIBC, IRON, RETICCTPCT in the last 72 hours. Urine analysis:    Component Value Date/Time   COLORURINE AMBER (A) 12/23/2017 0041   APPEARANCEUR CLEAR 12/23/2017 0041   LABSPEC 1.032 (H) 12/23/2017 0041   PHURINE 5.0 12/23/2017 0041   GLUCOSEU NEGATIVE 12/23/2017 0041   HGBUR NEGATIVE 12/23/2017 0041   BILIRUBINUR NEGATIVE 12/23/2017 0041   BILIRUBINUR negative 09/23/2016 1432   BILIRUBINUR neg 06/28/2013 1518   KETONESUR NEGATIVE 12/23/2017 0041   PROTEINUR 30 (A) 12/23/2017 0041   UROBILINOGEN 0.2 09/23/2016 1432   UROBILINOGEN  0.2 12/03/2009 0043   NITRITE NEGATIVE 12/23/2017 0041   LEUKOCYTESUR NEGATIVE 12/23/2017 0041    Radiological Exams on Admission: Dg Chest Portable 1 View  Result Date: 12/22/2017 CLINICAL DATA:  Shortness of breath.  Atrial fibrillation, fever. EXAM: PORTABLE CHEST 1 VIEW COMPARISON:  Radiograph 09/23/2016 FINDINGS: Unchanged cardiomegaly and mediastinal contours. Patchy retrocardiac consolidation. Streaky right infrahilar opacities. No pulmonary edema. Possible small left pleural effusion. No pneumothorax. Patient's chin obscures the apices. Remote right rib fractures. IMPRESSION: Patchy left lung base opacity may represent pneumonia in the setting of fever. Streaky right infrahilar atelectasis. Consider PA and lateral views when patient is able. Electronically Signed   By: Jeb Levering M.D.   On: 12/22/2017 22:43    EKG: Independently reviewed.  Assessment/Plan Principal Problem:   Sepsis ([place]) Active Problems:   Persistent atrial fibrillation (HCC)   OSA on CPAP   Community acquired pneumonia of left lower lobe of lung ([place])    1. Sepsis - LLL CAP vs dengue 1. IVF: 1L bolus in ED, will order 2nd L bolus and 125 cc/hr NS 2. LLL CAP - 1. PNA  pathway 2. IVF as above 3. Rocephin / azithro 4. Cultures pending 3. A.Fib - 1. Hold home cardizem 2. Use short acting PRN labetalol or cardizem gtt if needed 3. Holding home coumadin till we are certain that dengue hemorrhagic fever not a possibility 1. Keep eye on platelet count 2. Micro lab closed at night so dont know how to order blood test for this and we apparently cant find out tonight. 4. Daily INR 5. Use heparin gtt if needed  DVT prophylaxis: Coumadin INR 2.9 Code Status: Full Family Communication: Family at bedside Disposition Plan: Home after admit Consults called: EDP spoke with Dr. Megan Salon Admission status: Admit to inpatient   Etta Quill DO [facility] Pager [phone] Only works nights!  If 7AM-7PM, please contact the primary day team physician taking care of patient  [email or web address] Password [email or web address]  12/23/2017, 1:26 AM

## 2017-12-23 NOTE — Plan of Care (Signed)
  Problem: Education: Goal: Knowledge of General Education information will improve 12/23/2017 2344 by Drenda Freeze, RN Outcome: Progressing 12/23/2017 2118 by Drenda Freeze, RN Outcome: Progressing   Problem: Health Behavior/Discharge Planning: Goal: Ability to manage health-related needs will improve 12/23/2017 2344 by Drenda Freeze, RN Outcome: Progressing 12/23/2017 2118 by Drenda Freeze, RN Outcome: Progressing   Problem: Clinical Measurements: Goal: Ability to maintain clinical measurements within normal limits will improve 12/23/2017 2344 by Drenda Freeze, RN Outcome: Progressing 12/23/2017 2118 by Drenda Freeze, RN Outcome: Progressing

## 2017-12-23 NOTE — Progress Notes (Signed)
PHARMACY - PHYSICIAN COMMUNICATION CRITICAL VALUE ALERT - BLOOD CULTURE IDENTIFICATION (BCID)  Stanley Wright is an 72 y.o. male who [facility] to [facility] on 12/22/2017 with a chief complaint of fever  Assessment:   72 yo M presents with fever. CXR shows possible PNA but now with 3/4 blood cx's showing gram variable rods. BCID reports as E. Coli. Septic picture most likely due to E. Coli bacteremia.  Name of physician (or Provider) Contacted: Maurene Capes  Current antibiotics: Ceftriaxone and azithromycin  Changes to prescribed antibiotics recommended:  Increase ceftriaxone 2g IV Q24h Consider stopping azithromycin  Results for orders placed or performed during the hospital encounter of 12/22/17  Blood Culture ID Panel (Reflexed) (Collected: 12/22/2017 10:37 PM)  Result Value Ref Range   Enterococcus species NOT DETECTED NOT DETECTED   Listeria monocytogenes NOT DETECTED NOT DETECTED   Staphylococcus species NOT DETECTED NOT DETECTED   Staphylococcus aureus NOT DETECTED NOT DETECTED   Streptococcus species NOT DETECTED NOT DETECTED   Streptococcus agalactiae NOT DETECTED NOT DETECTED   Streptococcus pneumoniae NOT DETECTED NOT DETECTED   Streptococcus pyogenes NOT DETECTED NOT DETECTED   Acinetobacter baumannii NOT DETECTED NOT DETECTED   Enterobacteriaceae species DETECTED (A) NOT DETECTED   Enterobacter cloacae complex NOT DETECTED NOT DETECTED   Escherichia coli DETECTED (A) NOT DETECTED   Klebsiella oxytoca NOT DETECTED NOT DETECTED   Klebsiella pneumoniae NOT DETECTED NOT DETECTED   Proteus species NOT DETECTED NOT DETECTED   Serratia marcescens NOT DETECTED NOT DETECTED   Carbapenem resistance NOT DETECTED NOT DETECTED   Haemophilus influenzae NOT DETECTED NOT DETECTED   Neisseria meningitidis NOT DETECTED NOT DETECTED   Pseudomonas aeruginosa NOT DETECTED NOT DETECTED   Candida albicans NOT DETECTED NOT DETECTED   Candida glabrata NOT DETECTED NOT DETECTED   Candida  krusei NOT DETECTED NOT DETECTED   Candida parapsilosis NOT DETECTED NOT DETECTED   Candida tropicalis NOT DETECTED NOT DETECTED    Kaila Devries J 12/23/2017  2:50 PM

## 2017-12-23 NOTE — Progress Notes (Signed)
Daughter Dusi  Is POA and would like her father to be eval. For dec. mobility and freq. Falls and dec in cognitive And states "My father will not tell the truth when ask question." She lives next door to him and is finding it more difficult to care for him. She need some help at home.Her phone number is [phone]

## 2017-12-23 NOTE — Progress Notes (Signed)
Patient seen and evaluated, chart reviewed, please see EMR for updated orders. Please see full H&P dictated by admitting physician Dr Alcario Drought for same date of service.     72 year old male admitted with fevers and concerns about left lower lobe pneumonia  Blood cultures with E. coli which is not typically a common cause of pneumonia, UA does not suggest UTI  Continue IV Rocephin and azithromycin for presumed left-sided pneumonia for now pending further clinical and lab data  Recent travel to areas with endemic dengue hemorrhagic fevers, however low clinical index of suspicion for dating hemorrhagic fever at this time  Plan of Care discussed with patient and his older sister at bedside, questions answered  Patient seen and evaluated, chart reviewed, please see EMR for updated orders. Please see full H&P dictated by admitting physician Dr Alcario Drought for same date of service

## 2017-12-24 LAB — URINE CULTURE: CULTURE: NO GROWTH

## 2017-12-24 LAB — PROTIME-INR
INR: 2.01
PROTHROMBIN TIME: 22.6 s — AB (ref 11.4–15.2)

## 2017-12-24 MED ORDER — METOPROLOL TARTRATE 25 MG PO TABS
25.0000 mg | ORAL_TABLET | Freq: Once | ORAL | Status: AC
  Administered 2017-12-24: 25 mg via ORAL
  Filled 2017-12-24: qty 1

## 2017-12-24 MED ORDER — DILTIAZEM HCL ER COATED BEADS 180 MG PO CP24
300.0000 mg | ORAL_CAPSULE | Freq: Every day | ORAL | Status: DC
  Administered 2017-12-24 – 2017-12-25 (×2): 300 mg via ORAL
  Filled 2017-12-24 (×2): qty 1

## 2017-12-24 MED ORDER — DILTIAZEM HCL 25 MG/5ML IV SOLN
10.0000 mg | Freq: Once | INTRAVENOUS | Status: AC
  Administered 2017-12-24: 10 mg via INTRAVENOUS
  Filled 2017-12-24: qty 5

## 2017-12-24 MED ORDER — GUAIFENESIN ER 600 MG PO TB12
600.0000 mg | ORAL_TABLET | Freq: Two times a day (BID) | ORAL | Status: DC
  Administered 2017-12-24 – 2017-12-26 (×5): 600 mg via ORAL
  Filled 2017-12-24 (×5): qty 1

## 2017-12-24 MED ORDER — METOPROLOL TARTRATE 5 MG/5ML IV SOLN
5.0000 mg | Freq: Once | INTRAVENOUS | Status: AC
  Administered 2017-12-24: 5 mg via INTRAVENOUS
  Filled 2017-12-24: qty 5

## 2017-12-24 MED ORDER — LEVALBUTEROL HCL 0.63 MG/3ML IN NEBU
0.6300 mg | INHALATION_SOLUTION | Freq: Three times a day (TID) | RESPIRATORY_TRACT | Status: AC
  Administered 2017-12-24 – 2017-12-25 (×4): 0.63 mg via RESPIRATORY_TRACT
  Filled 2017-12-24 (×5): qty 3

## 2017-12-24 NOTE — Progress Notes (Signed)
Patient in A Fib with RVR heart rate 140's- 150's MD notified awaiting orders.

## 2017-12-24 NOTE — Progress Notes (Signed)
Patient heart rate 140's -150's A Fib with RVR MD notified awaiting orders.

## 2017-12-24 NOTE — Progress Notes (Signed)
Patient in A Fib with rate 130's- 140's MD notified awaiting orders.

## 2017-12-24 NOTE — Evaluation (Signed)
Physical Therapy Evaluation Patient Details Name: Stanley Wright MRN: [id number] DOB: 1945-11-09 Today's Date: 12/24/2017   History of Present Illness  Pt adm with sepsis due to PNA, afib with RVR. Pt with recent return from mission trip to [place]. Question of dengue fever but that thought unlikely. PMH - afib, DVT, PE, arthritis  Clinical Impression  Pt presents to PT with balance deficits and a history of falls (pt reported no falls to me but daughter reports he has frequent falls). Pt with shuffling gait and expect falls may occur when he catches his feet. Pt denies this ever happens.  Feel pt could benefit from OPPT to address balance issues. May also benefit from outpt neuro eval as daughter reports a decline in overall mobility and balance. Will attempt to have pt use assistive device during next PT session but not sure if he will be receptive to this.     Follow Up Recommendations Outpatient PT    Equipment Recommendations  Other (comment)(To be determined)    Recommendations for Other Services OT consult     Precautions / Restrictions Precautions Precautions: Fall Restrictions Weight Bearing Restrictions: No      Mobility  Bed Mobility               General bed mobility comments: Pt up in chair  Transfers Overall transfer level: Needs assistance Equipment used: None Transfers: Sit to/from Stand Sit to Stand: Supervision         General transfer comment: Incr time and effort to come up from low recliner  Ambulation/Gait Ambulation/Gait assistance: Supervision Gait Distance (Feet): 350 Feet Assistive device: IV Pole;None Gait Pattern/deviations: Step-through pattern;Decreased step length - right;Decreased step length - left;Shuffle;Trunk flexed Gait velocity: decr Gait velocity interpretation: 1.31 - 2.62 ft/sec, indicative of limited community ambulator General Gait Details: Assist for safety. Pt with low clearance of both feet during swing    Stairs            Wheelchair Mobility    Modified Rankin (Stroke Patients Only)       Balance Overall balance assessment: Needs assistance Sitting-balance support: No upper extremity supported;Feet supported Sitting balance-Leahy Scale: Good     Standing balance support: No upper extremity supported;During functional activity Standing balance-Leahy Scale: Fair Standing balance comment: with dynamic movements pt hesitant and would use UE support if available                             Pertinent Vitals/Pain Pain Assessment: No/denies pain    Home Living Family/patient expects to be discharged to:: Private residence Living Arrangements: Alone Available Help at Discharge: Family;Available PRN/intermittently Type of Home: House Home Access: Stairs to enter Entrance Stairs-Rails: None(son in Sports coach to build rail) Technical brewer of Steps: 3 Home Layout: One level;Other (Comment)(1 step down to sun room) Home Equipment: None      Prior Function Level of Independence: Independent         Comments: Amb without assistive device. Pt denies falls but daughter reports multiple falls.     Hand Dominance        Extremity/Trunk Assessment   Upper Extremity Assessment Upper Extremity Assessment: Defer to OT evaluation    Lower Extremity Assessment Lower Extremity Assessment: Generalized weakness       Communication   Communication: HOH  Cognition Arousal/Alertness: Awake/alert Behavior During Therapy: WFL for tasks assessed/performed Overall Cognitive Status: Within Functional Limits for tasks assessed  General Comments: Pt denies falls at home but daughter reports multiple falls      General Comments      Exercises     Assessment/Plan    PT Assessment Patient needs continued PT services  PT Problem List Decreased strength;Decreased balance;Decreased mobility;Decreased safety  awareness       PT Treatment Interventions DME instruction;Gait training;Stair training;Functional mobility training;Therapeutic activities;Therapeutic exercise;Balance training;Patient/family education    PT Goals (Current goals can be found in the Care Plan section)  Acute Rehab PT Goals Patient Stated Goal: return home PT Goal Formulation: With patient Time For Goal Achievement: 12/31/17 Potential to Achieve Goals: Good    Frequency Min 3X/week   Barriers to discharge Inaccessible home environment;Decreased caregiver support Lives alone and has stairs to enter    Co-evaluation               AM-PAC PT "6 Clicks" Daily Activity  Outcome Measure Difficulty turning over in bed (including adjusting bedclothes, sheets and blankets)?: A Little Difficulty moving from lying on back to sitting on the side of the bed? : A Little Difficulty sitting down on and standing up from a chair with arms (e.g., wheelchair, bedside commode, etc,.)?: A Little Help needed moving to and from a bed to chair (including a wheelchair)?: A Little Help needed walking in hospital room?: A Little Help needed climbing 3-5 steps with a railing? : A Little 6 Click Score: 18    End of Session   Activity Tolerance: Patient tolerated treatment well Patient left: in chair;with call bell/phone within reach;with family/visitor present   PT Visit Diagnosis: Unsteadiness on feet (R26.81);Other abnormalities of gait and mobility (R26.89);Repeated falls (R29.6)    Time: 0981-1914 PT Time Calculation (min) (ACUTE ONLY): 22 min   Charges:   PT Evaluation $PT Eval Moderate Complexity: 1 Mod     PT G CodesMarland Kitchen        [facility] [place] 12/24/2017, 4:30 PM

## 2017-12-24 NOTE — Progress Notes (Signed)
Pt. Refused cpap for tonight. 

## 2017-12-24 NOTE — Progress Notes (Signed)
Patient Demographics:    Stanley Wright, is a 72 y.o. male, DOB - 1945-12-09, KZL:[id number]  Admit date - 12/22/2017   Admitting Physician Etta Quill, DO  Outpatient Primary MD for the patient is Wendie Agreste, MD  LOS - 1   Chief Complaint  Patient presents with  . 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 coli bacteremia -- ????  Significance, blood cultures with E. coli which is not typically a common cause of pneumonia, UA does not suggest UTI, urine culture negative,  consider ID consult continue Rocephin for now  3) elevated LFTs-recent travel to [place], check for hepatitis profile, check liver ultrasound,   4) tropical diseases- Recent travel to areas with endemic dengue hemorrhagic fevers, however low clinical index of suspicion for dengue hemorrhagic fever at this time, follow CBC including platelets closely  5) chronic A. Fib--- RVR noted, restart Cardizem 300 daily, give one-time metoprolol 25 p.o., Coumadin on hold, consider heparin bridge, however monitor platelets  closely  Code Status : full  Disposition Plan  : home   Consults  : Consider  ID consult   DVT Prophylaxis  : SCDs  Lab Results  Component Value Date   PLT 94 (L) 12/23/2017    Inpatient Medications  Scheduled Meds: . azithromycin  500 mg Oral Q24H  . diltiazem  300 mg Oral Daily  . guaiFENesin  600 mg Oral BID  . levalbuterol  0.63 mg Nebulization TID   Continuous Infusions: . sodium chloride Stopped (12/23/17 0344)  . sodium chloride 125 mL/hr at 12/24/17 0545  . cefTRIAXone (ROCEPHIN)  IV Stopped (12/23/17 1718)   PRN Meds:.sodium chloride, acetaminophen    Anti-infectives (From admission, onward)   Start     Dose/Rate Route Frequency Ordered Stop   12/24/17 1000  azithromycin (ZITHROMAX) tablet 500 mg     500 mg Oral Every 24 hours 12/23/17 0115 12/31/17 0959   12/24/17 0100  cefTRIAXone (ROCEPHIN) 1 g in sodium chloride 0.9 % 100 mL IVPB  Status:  Discontinued     1 g 200 mL/hr over 30 Minutes Intravenous Every 24 hours 12/23/17 0115 12/23/17 1453   12/23/17 1600  cefTRIAXone (ROCEPHIN) 2 g in sodium chloride 0.9 % 100 mL IVPB     2 g 200 mL/hr over 30 Minutes Intravenous Every 24 hours 12/23/17 1453 12/30/17 1559   12/23/17 0000  cefTRIAXone (ROCEPHIN) 1 g in sodium chloride 0.9 % 100  mL IVPB     1 g 200 mL/hr over 30 Minutes Intravenous  Once 12/22/17 2349 12/23/17 0133   12/23/17 0000  azithromycin (ZITHROMAX) 500 mg in sodium chloride 0.9 % 250 mL IVPB     500 mg 250 mL/hr over 60 Minutes Intravenous  Once 12/22/17 2349 12/23/17 0134        Objective:   Vitals:   12/24/17 0347 12/24/17 0730 12/24/17 0842 12/24/17 0843  BP: 123/89     Pulse: (!) 132  (!) 131   Resp: (!) 31  16   Temp: (!) 100.8 F (38.2 C)     TempSrc: Oral     SpO2: 92%  91% 93%  Weight:  121.8 kg (268 lb 8.3 oz)    Height:        Wt Readings from Last 3 Encounters:  12/24/17 121.8 kg (268 lb 8.3 oz)  10/14/17 115.7 kg (255 lb)  09/23/16 122.5 kg (270 lb)      Intake/Output Summary (Last 24 hours) at 12/24/2017 1328 Last data filed at 12/24/2017 0400 Gross per 24 hour  Intake 2594.81 ml  Output 400 ml  Net 2194.81 ml     Physical Exam  Gen:- Awake Alert,  In no apparent distress  HEENT:- [place].AT, No sclera icterus Neck-Supple Neck,No JVD,.  Lungs-diminished in bases, no wheezing CV- S1, S2 normal, irregularly irregular, heart rate in the 120s to 130s Abd-  +ve B.Sounds, Abd Soft, No tenderness,    Extremity/Skin:-1+ pitting edema, good pulses Psych-affect is appropriate, oriented x3 Neuro-no new focal deficits, no tremors   Data Review:   Micro Results Recent Results (from the past 240 hour(s))  Culture, blood (Routine x 2)     Status: Abnormal (Preliminary result)   Collection Time: 12/22/17 10:37 PM  Result Value Ref Range Status   Specimen Description BLOOD LEFT HAND  Final   Special Requests   Final    BOTTLES DRAWN AEROBIC AND ANAEROBIC Blood Culture adequate volume   Culture  Setup Time (A)  Final    GRAM VARIABLE ROD IN BOTH AEROBIC AND ANAEROBIC BOTTLES Organism ID to follow CRITICAL RESULT CALLED TO, READ BACK BY AND VERIFIED WITH: Karlene Einstein PharmD 14:50 12/23/17 ([email or web address])    Culture (A)  Final    ESCHERICHIA COLI SUSCEPTIBILITIES TO FOLLOW Performed at [facility], [address], [place], [place] [address]    Report Status PENDING  Incomplete  Blood Culture ID Panel (Reflexed)     Status: Abnormal   Collection Time: 12/22/17 10:37 PM  Result Value Ref Range Status   Enterococcus species NOT DETECTED NOT DETECTED Final   Listeria monocytogenes NOT DETECTED NOT DETECTED Final   Staphylococcus species NOT DETECTED NOT DETECTED Final   Staphylococcus aureus NOT DETECTED NOT DETECTED Final   Streptococcus species NOT DETECTED NOT DETECTED Final   Streptococcus agalactiae NOT DETECTED NOT DETECTED Final   Streptococcus pneumoniae NOT DETECTED NOT DETECTED Final   Streptococcus pyogenes NOT DETECTED  NOT DETECTED Final   Acinetobacter baumannii NOT DETECTED NOT DETECTED Final   Enterobacteriaceae species DETECTED (A) NOT DETECTED Final    Comment: Enterobacteriaceae represent a large family of gram-negative bacteria, not a single organism. CRITICAL RESULT CALLED TO, READ BACK BY AND VERIFIED WITH: Karlene Einstein PharmD 14:50 12/23/17 ([email or web address])    Enterobacter cloacae complex NOT DETECTED NOT DETECTED Final   Escherichia coli DETECTED (A) NOT DETECTED Final    Comment: CRITICAL RESULT CALLED TO, READ BACK BY AND VERIFIED WITH: N.  [place]lder PharmD 14:50 12/23/17 (wilsonm)    Klebsiella oxytoca NOT DETECTED NOT DETECTED Final   Klebsiella pneumoniae NOT DETECTED NOT DETECTED Final   Proteus species NOT DETECTED NOT DETECTED Final   Serratia marcescens NOT DETECTED NOT DETECTED Final   Carbapenem resistance NOT DETECTED NOT DETECTED Final   Haemophilus influenzae NOT DETECTED NOT DETECTED Final   Neisseria meningitidis NOT DETECTED NOT DETECTED Final   Pseudomonas aeruginosa NOT DETECTED NOT DETECTED Final   Candida albicans NOT DETECTED NOT DETECTED Final   Candida glabrata NOT DETECTED NOT DETECTED Final   Candida krusei NOT DETECTED NOT DETECTED Final   Candida parapsilosis NOT DETECTED NOT DETECTED Final   Candida tropicalis NOT DETECTED NOT DETECTED Final    Comment: Performed at [facility], [place] [address] [place], [place] [address]  Culture, blood (Routine x 2)     Status: Abnormal (Preliminary result)   Collection Time: 12/22/17 10:43 PM  Result Value Ref Range Status   Specimen Description BLOOD RIGHT HAND  Final   Special Requests   Final    BOTTLES DRAWN AEROBIC AND ANAEROBIC Blood Culture adequate volume   Culture  Setup Time (A)  Final    GRAM VARIABLE ROD IN BOTH AEROBIC AND ANAEROBIC BOTTLES CRITICAL VALUE NOTED.  VALUE IS CONSISTENT WITH PREVIOUSLY REPORTED AND CALLED VALUE. Performed at [facility], [place] [address] [place], [place] [address]     Culture GRAM VARIABLE ROD (A)  Final   Report Status PENDING  Incomplete  Urine Culture     Status: None   Collection Time: 12/23/17 12:41 AM  Result Value Ref Range Status   Specimen Description URINE, CLEAN CATCH  Final   Special Requests NONE  Final   Culture   Final    NO GROWTH Performed at [facility], [place] [address] [place], [place] [address]    Report Status 12/24/2017 FINAL  Final    Radiology Reports Mr Jeri Cos BJ Contrast  Result Date: 12/10/2017 CLINICAL DATA:  Slurred speech and gait disturbance over the last 3 months. Hearing loss. Weakness. Creatinine was obtained on site at [facility] at [address] Results: Creatinine 1.2 mg/dL. EXAM: MRI HEAD WITHOUT AND WITH CONTRAST TECHNIQUE: Multiplanar, multiecho pulse sequences of the brain and surrounding structures were obtained without and with intravenous contrast. CONTRAST:  83mL MULTIHANCE GADOBENATE DIMEGLUMINE 529 MG/ML IV SOLN COMPARISON:  None. FINDINGS: Brain: Diffusion imaging does not show any acute or subacute infarction. The brainstem and cerebellum are normal. Cerebral hemispheres show moderate chronic small-vessel ischemic changes of the deep and subcortical white matter. No cortical or large vessel territory infarction. No mass lesion, hemorrhage, hydrocephalus or extra-axial collection. After contrast administration, no abnormal enhancement occurs. Vascular: Major vessels at the base of the brain show flow. Skull and upper cervical spine: Negative Sinuses/Orbits: Clear/normal Other: None IMPRESSION: No acute or reversible finding. No specific cause of the presenting symptoms is identified. Moderate chronic small-vessel ischemic changes of the cerebral hemispheric white matter. Electronically Signed   By: Nelson Chimes M.D.   On: 12/10/2017 09:29   Dg Chest Portable 1 View  Result Date: 12/22/2017 CLINICAL DATA:  Shortness of breath.  Atrial fibrillation, fever. EXAM: PORTABLE CHEST 1 VIEW  COMPARISON:  Radiograph 09/23/2016 FINDINGS: Unchanged cardiomegaly and mediastinal contours. Patchy retrocardiac consolidation. Streaky right infrahilar opacities. No pulmonary edema. Possible small left pleural effusion. No pneumothorax. Patient's chin obscures the apices. Remote right rib fractures. IMPRESSION: Patchy left lung base  opacity may represent pneumonia in the setting of fever. Streaky right infrahilar atelectasis. Consider PA and lateral views when patient is able. Electronically Signed   By: Jeb Levering M.D.   On: 12/22/2017 22:43     CBC Recent Labs  Lab 12/22/17 2233 12/23/17 0331  WBC 6.9 7.3  HGB 13.8 13.1  HCT 41.0 40.4  PLT 101* 94*  MCV 97.6 100.0  MCH 32.9 32.4  MCHC 33.7 32.4  RDW 14.0 14.4    Chemistries  Recent Labs  Lab 12/22/17 2233 12/23/17 0330 12/23/17 0331  NA 138  --  140  K 3.5  --  3.5  CL 110  --  113*  CO2 22  --  20*  GLUCOSE 163*  --  192*  BUN 16  --  13  CREATININE 1.12  --  1.03  CALCIUM 8.1*  --  7.8*  AST  --  75*  --   ALT  --  70*  --   ALKPHOS  --  82  --   BILITOT  --  1.6*  --    ------------------------------------------------------------------------------------------------------------------ No results for input(s): CHOL, HDL, LDLCALC, TRIG, CHOLHDL, LDLDIRECT in the last 72 hours.  Lab Results  Component Value Date   HGBA1C 6.3 11/21/2015   ------------------------------------------------------------------------------------------------------------------ No results for input(s): TSH, T4TOTAL, T3FREE, THYROIDAB in the last 72 hours.  Invalid input(s): FREET3 ------------------------------------------------------------------------------------------------------------------ No results for input(s): VITAMINB12, FOLATE, FERRITIN, TIBC, IRON, RETICCTPCT in the last 72 hours.  Coagulation profile Recent Labs  Lab 12/22/17 2233 12/23/17 0330 12/24/17 0436  INR 2.96 3.16 2.01    No results for input(s): DDIMER  in the last 72 hours.  Cardiac Enzymes No results for input(s): CKMB, TROPONINI, MYOGLOBIN in the last 168 hours.  Invalid input(s): CK ------------------------------------------------------------------------------------------------------------------    Component Value Date/Time   BNP 99.5 11/21/2015 0849     Roxan Hockey M.D on 12/24/2017 at 1:28 PM  Between 7am to 7pm - Pager - [phone]  After 7pm go to [email or web address] - password [facility]  [facility] -  Office  [phone]

## 2017-12-24 NOTE — Progress Notes (Signed)
RT set up CPAP and placed on patient with 4L o2 bled into circuit. RT will monitor as needed.

## 2017-12-24 NOTE — Plan of Care (Signed)
  Problem: Education: Goal: Knowledge of General Education information will improve Outcome: Progressing   Problem: Health Behavior/Discharge Planning: Goal: Ability to manage health-related needs will improve Outcome: Progressing   Problem: Clinical Measurements: Goal: Ability to maintain clinical measurements within normal limits will improve Outcome: Progressing   

## 2017-12-25 ENCOUNTER — Inpatient Hospital Stay (HOSPITAL_COMMUNITY): Payer: PPO

## 2017-12-25 DIAGNOSIS — I482 Chronic atrial fibrillation: Secondary | ICD-10-CM

## 2017-12-25 LAB — CBC
HEMATOCRIT: 43.3 % (ref 39.0–52.0)
Hemoglobin: 14.3 g/dL (ref 13.0–17.0)
MCH: 32 pg (ref 26.0–34.0)
MCHC: 33 g/dL (ref 30.0–36.0)
MCV: 96.9 fL (ref 78.0–100.0)
PLATELETS: 107 10*3/uL — AB (ref 150–400)
RBC: 4.47 MIL/uL (ref 4.22–5.81)
RDW: 14.2 % (ref 11.5–15.5)
WBC: 4.8 10*3/uL (ref 4.0–10.5)

## 2017-12-25 LAB — COMPREHENSIVE METABOLIC PANEL
ALK PHOS: 95 U/L (ref 38–126)
ALT: 60 U/L — ABNORMAL HIGH (ref 0–44)
ANION GAP: 11 (ref 5–15)
AST: 53 U/L — ABNORMAL HIGH (ref 15–41)
Albumin: 3 g/dL — ABNORMAL LOW (ref 3.5–5.0)
BUN: 9 mg/dL (ref 8–23)
CO2: 24 mmol/L (ref 22–32)
CREATININE: 0.93 mg/dL (ref 0.61–1.24)
Calcium: 8.3 mg/dL — ABNORMAL LOW (ref 8.9–10.3)
Chloride: 104 mmol/L (ref 98–111)
GFR calc non Af Amer: >60 mL/min (ref >60)
Glucose, Bld: 103 mg/dL — ABNORMAL HIGH (ref 70–99)
Potassium: 3.4 mmol/L — ABNORMAL LOW (ref 3.5–5.1)
SODIUM: 139 mmol/L (ref 135–145)
Total Bilirubin: 1.2 mg/dL (ref 0.3–1.2)
Total Protein: 6.5 g/dL (ref 6.5–8.1)

## 2017-12-25 LAB — HEPATITIS PANEL, ACUTE
HCV Ab: <0.1 s/co ratio (ref 0.0–0.9)
HEP A IGM: NEGATIVE
HEP B C IGM: NEGATIVE
Hepatitis B Surface Ag: NEGATIVE

## 2017-12-25 LAB — PROTIME-INR
INR: 1.43
Prothrombin Time: 17.4 seconds — ABNORMAL HIGH (ref 11.4–15.2)

## 2017-12-25 LAB — MAGNESIUM: Magnesium: 1.9 mg/dL (ref 1.7–2.4)

## 2017-12-25 MED ORDER — WARFARIN - PHARMACIST DOSING INPATIENT: Freq: Every day | Status: DC

## 2017-12-25 MED ORDER — METOPROLOL TARTRATE 25 MG PO TABS
25.0000 mg | ORAL_TABLET | Freq: Two times a day (BID) | ORAL | Status: DC
  Administered 2017-12-25 – 2017-12-26 (×2): 25 mg via ORAL
  Filled 2017-12-25 (×2): qty 1

## 2017-12-25 MED ORDER — POTASSIUM CHLORIDE CRYS ER 20 MEQ PO TBCR
40.0000 meq | EXTENDED_RELEASE_TABLET | Freq: Once | ORAL | Status: AC
  Administered 2017-12-25: 40 meq via ORAL
  Filled 2017-12-25: qty 2

## 2017-12-25 MED ORDER — DILTIAZEM HCL 60 MG PO TABS
30.0000 mg | ORAL_TABLET | Freq: Four times a day (QID) | ORAL | Status: AC
  Administered 2017-12-25: 30 mg via ORAL
  Filled 2017-12-25: qty 1

## 2017-12-25 MED ORDER — DILTIAZEM HCL ER COATED BEADS 360 MG PO CP24
360.0000 mg | ORAL_CAPSULE | Freq: Every day | ORAL | Status: DC
  Administered 2017-12-26: 360 mg via ORAL
  Filled 2017-12-25: qty 2
  Filled 2017-12-25: qty 1

## 2017-12-25 MED ORDER — DILTIAZEM HCL 60 MG PO TABS: 30.0000 mg | ORAL_TABLET | Freq: Four times a day (QID) | ORAL | Status: DC

## 2017-12-25 MED ORDER — METOPROLOL TARTRATE 25 MG PO TABS
25.0000 mg | ORAL_TABLET | Freq: Once | ORAL | Status: AC
  Administered 2017-12-25: 25 mg via ORAL
  Filled 2017-12-25: qty 1

## 2017-12-25 MED ORDER — ENOXAPARIN SODIUM 60 MG/0.6ML ~~LOC~~ SOLN
60.0000 mg | Freq: Every day | SUBCUTANEOUS | Status: DC
  Administered 2017-12-25 – 2017-12-26 (×2): 60 mg via SUBCUTANEOUS
  Filled 2017-12-25 (×2): qty 0.6

## 2017-12-25 MED ORDER — METOPROLOL TARTRATE 5 MG/5ML IV SOLN
5.0000 mg | Freq: Once | INTRAVENOUS | Status: AC
  Administered 2017-12-25: 5 mg via INTRAVENOUS
  Filled 2017-12-25: qty 5

## 2017-12-25 MED ORDER — WARFARIN SODIUM 7.5 MG PO TABS
7.5000 mg | ORAL_TABLET | Freq: Once | ORAL | Status: AC
  Administered 2017-12-25: 7.5 mg via ORAL
  Filled 2017-12-25: qty 1

## 2017-12-25 NOTE — Progress Notes (Signed)
Physical Therapy Treatment Patient Details Name: Stanley Wright MRN: [id number] DOB: [birth year] Today's Date: 12/25/2017    History of Present Illness Pt adm with sepsis due to PNA, afib with RVR. Pt with recent return from mission trip to [place]. Question of dengue fever but that thought unlikely. PMH - afib, DVT, PE, arthritis    PT Comments    Pt admitted with above diagnosis. Pt currently with functional limitations due to the deficits listed below (see PT Problem List). Pt was able to ambulate with min guard assist with PT educating in use of cane but pt refusing to use it correctly.  Given that pt shuffles feet, pt would benefit from use of RW but declines that as well.  He feels that it is the hospital environment and "I will be fine when I get home."  Pt is aware of PT recommendation to use RW.  Pt HR 96--138 bpm with activity.  Will continue to follow acutely.  Pt will benefit from skilled PT to increase their independence and safety with mobility to allow discharge to the venue listed below.     Follow Up Recommendations  Outpatient PT     Equipment Recommendations  Rolling walker with 5" wheels(pt refuses to use RW)    Recommendations for Other Services OT consult     Precautions / Restrictions Precautions Precautions: Fall Restrictions Weight Bearing Restrictions: No    Mobility  Bed Mobility               General bed mobility comments: Pt up in chair  Transfers Overall transfer level: Needs assistance Equipment used: None Transfers: Sit to/from Stand Sit to Stand: Supervision         General transfer comment: Incr time and effort to come up from low recliner  Ambulation/Gait Ambulation/Gait assistance: Min guard;Min assist Gait Distance (Feet): 250 Feet Assistive device: None;Straight cane Gait Pattern/deviations: Step-through pattern;Decreased step length - right;Decreased step length - left;Shuffle;Trunk flexed Gait velocity: decr Gait  velocity interpretation: 1.31 - 2.62 ft/sec, indicative of limited community ambulator General Gait Details: Assist for safety. Pt with low clearance of both feet during swing.  ATtempted to educate pt to use the cane however he continued to carry it at times.  Pt states he does not need a cane or walker even though notable LOB at times which he self corrected.  Pt makes excuses saying it was because you gave me the cane and you were holding onto me and this is not like home.  Pt aware that PT recommends RW use for ultimate safety but pt declines.    Stairs             Wheelchair Mobility    Modified Rankin (Stroke Patients Only)       Balance Overall balance assessment: Needs assistance Sitting-balance support: No upper extremity supported;Feet supported Sitting balance-Leahy Scale: Good     Standing balance support: No upper extremity supported;During functional activity Standing balance-Leahy Scale: Fair Standing balance comment: with dynamic movements pt hesitant and would use UE support if available                            Cognition Arousal/Alertness: Awake/alert Behavior During Therapy: WFL for tasks assessed/performed Overall Cognitive Status: Within Functional Limits for tasks assessed  General Comments: Pt denies falls at home but daughter reports multiple falls      Exercises General Exercises - Lower Extremity Ankle Circles/Pumps: AROM;Both;10 reps;Supine Long Arc Quad: AROM;Both;10 reps;Seated    General Comments General comments (skin integrity, edema, etc.): HR 96-138 bpm with activity.  Nurse aware. O2 94% and above on RA therefore left O2 off.       Pertinent Vitals/Pain Pain Assessment: No/denies pain    Home Living                      Prior Function            PT Goals (current goals can now be found in the care plan section) Acute Rehab PT Goals Patient Stated Goal:  return home Progress towards PT goals: Progressing toward goals    Frequency    Min 3X/week      PT Plan Current plan remains appropriate    Co-evaluation              AM-PAC PT "6 Clicks" Daily Activity  Outcome Measure  Difficulty turning over in bed (including adjusting bedclothes, sheets and blankets)?: A Little Difficulty moving from lying on back to sitting on the side of the bed? : A Little Difficulty sitting down on and standing up from a chair with arms (e.g., wheelchair, bedside commode, etc,.)?: A Little Help needed moving to and from a bed to chair (including a wheelchair)?: A Little Help needed walking in hospital room?: A Little Help needed climbing 3-5 steps with a railing? : A Little 6 Click Score: 18    End of Session Equipment Utilized During Treatment: Gait belt Activity Tolerance: Patient tolerated treatment well Patient left: in chair;with call bell/phone within reach Nurse Communication: Mobility status PT Visit Diagnosis: Unsteadiness on feet (R26.81);Other abnormalities of gait and mobility (R26.89);Repeated falls (R29.6)     Time: 9485-4627 PT Time Calculation (min) (ACUTE ONLY): 23 min  Charges:  $Gait Training: 8-22 mins $Therapeutic Exercise: 8-22 mins                    G Codes:       Stanley Wright,PT Acute Rehabilitation [phone] (pager)    Stanley Wright 12/25/2017, 1:21 PM

## 2017-12-25 NOTE — Progress Notes (Signed)
PT Cancellation Note  Patient Details Name: SAILOR HAUGHN MRN: [id number] DOB: [birth year]   Cancelled Treatment:    Reason Eval/Treat Not Completed: Medical issues which prohibited therapy(HR 110-151 bpm at rest.  Nurse to give meds. Return in hour)   Denice Paradise 12/25/2017, 10:40 AM Amanda Cockayne Acute Rehabilitation [phone] (pager)

## 2017-12-25 NOTE — Progress Notes (Signed)
OT Cancellation Note  Patient Details Name: Stanley Wright MRN: [id number] DOB: [birth year]   Cancelled Treatment:    Reason Eval/Treat Not Completed: Patient at procedure or test/ unavailable.   Will reattempt.  Niklas Chretien [place], OTR/L [phone]   Lucille Passy M 12/25/2017, 1:46 PM

## 2017-12-25 NOTE — Progress Notes (Signed)
[place] for warfarin Indication: afib/DVT  Allergies  Allergen Reactions  . Tylenol [Acetaminophen] Other (See Comments)    Pt states he had a DNA test completed that stated he should never take tylenol.    Patient Measurements: Height: 6' (182.9 cm) Weight: 266 lb 1.5 oz (120.7 kg) IBW/kg (Calculated) : 77.6   Vital Signs: Temp: 99.1 F (37.3 C) (07/12 0809) Temp Source: Oral (07/12 0809) BP: 132/76 (07/12 0809) Pulse Rate: 128 (07/12 0809)  Labs: Recent Labs    12/22/17 2233 12/23/17 0330 12/23/17 0331 12/24/17 0436 12/25/17 0319  HGB 13.8  --  13.1  --  14.3  HCT 41.0  --  40.4  --  43.3  PLT 101*  --  94*  --  107*  LABPROT 30.6* 32.2*  --  22.6* 17.4*  INR 2.96 3.16  --  2.01 1.43  CREATININE 1.12  --  1.03  --  0.93    Estimated Creatinine Clearance: 96.3 mL/min (by C-G formula based on SCr of 0.93 mg/dL).   Medical History: Past Medical History:  Diagnosis Date  . Arthritis   . BENIGN PROSTATIC HYPERTROPHY, WITH OBSTRUCTION   . Cancer (HCC)    skin, basal, squamous  . COLONIC POLYPS   . Diverticulitis   . DVT (deep venous thrombosis) ([place]) 2000  . Dysrhythmia    Hx Afib- 2017  . Factor 5 Leiden mutation, heterozygous ([place])   . GERD (gastroesophageal reflux disease)    not on medication  . HIATAL HERNIA   . ITP (idiopathic thrombocytopenic purpura) 2003  . Long term current use of anticoagulant   . PSORIASIS   . Pulmonary embolus ([place]) 2000  . Shortness of breath dyspnea    at times - Talking alot  . Sleep apnea     Medications:  Medications Prior to Admission  Medication Sig Dispense Refill Last Dose  . diltiazem (TIAZAC) 300 MG 24 hr capsule Take 1 capsule (300 mg total) by mouth daily. 90 capsule 0 12/22/2017 at Unknown time  . traMADol (ULTRAM) 50 MG tablet Take 1 tablet (50 mg total) by mouth every 6 (six) hours as needed. (Patient taking differently: Take 50 mg by mouth every 6 (six) hours as  needed for moderate pain. ) 20 tablet 0 unk  . warfarin (COUMADIN) 5 MG tablet TAKE 1 TABLET BY MOUTH ONCE DAILY OR  AS  DIRECTED  BY  COUMADIN  CLINIC 90 tablet 1 12/22/2017 at 1400   Scheduled:  . azithromycin  500 mg Oral Q24H  . diltiazem  300 mg Oral Daily  . enoxaparin (LOVENOX) injection  60 mg Subcutaneous Daily  . guaiFENesin  600 mg Oral BID  . levalbuterol  0.63 mg Nebulization TID  . metoprolol tartrate  25 mg Oral Once  . potassium chloride  40 mEq Oral Once  . potassium chloride  40 mEq Oral Once    Assessment: 72 yo male with PNA and on coumadin PTA for afib (also hx factor V liden history of DVT).  Pharmacy consulted to dose coumadin. He is also on lovenox (prophylactic dose) -INR= 1.43 -plt= 107, hg= 14.3  Home coumadin dose: 5mg /day (last clinic visit 10/30/17; INR was 2.2 with goal INR 2-3)   Goal of Therapy:  INR 2-3 Monitor platelets by anticoagulation protocol: Yes   Plan:  -Coumadin 7.5mg  po today -Daily PT/INR  Hildred Laser, PharmD Clinical Pharmacist Please check Amion for pharmacy contact number

## 2017-12-25 NOTE — Progress Notes (Signed)
Patient Demographics:    Stanley Wright, is a 72 y.o. male, DOB - [birth year], ASN:[id number]  Admit date - 12/22/2017   Admitting Physician Etta Quill, DO  Outpatient Primary MD for the patient is Wendie Agreste, MD  LOS - 2   Chief Complaint  Patient presents with  . 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 coli bacteremia -- ????  Significance, blood cultures with E. coli which is not typically a common cause of pneumonia, UA does not suggest UTI, urine culture negative,  D/w Dr Megan Salon  ID --- , will  continue Rocephin , and discharged home on [place] for a total of 7 days of antibiotics   3)Elevated LFTs-recent travel to [place], acute viral hepatitis profile is negative, liver ultrasound without significant abnormalities, follow LFTs which appears to be trending down already, avoid hepatotoxic agents  4)Possible Tropical diseases- Recent travel to areas with endemic dengue hemorrhagic fevers, however low clinical index of suspicion for dengue  hemorrhagic fever at this time, follow CBC including platelets closely, mild thrombocytopenia with platelet count around 100 K noted  5) chronic A. Fib--- RVR noted, control remains challenging so increase Cardizem to 360 daily, give metoprolol 25 mg p.o. twice daily , restart Coumadin for anticoagulation  Code Status : full  Disposition Plan  : home   Consults  :    DVT Prophylaxis  : Lovenox given that INR subtherapeutic  Lab Results  Component Value Date   PLT 107 (L) 12/25/2017    Inpatient Medications  Scheduled Meds: . azithromycin  500 mg Oral Q24H  . [START ON 12/26/2017] diltiazem  360 mg Oral Daily  . enoxaparin (LOVENOX) injection  60 mg Subcutaneous Daily  . guaiFENesin  600 mg Oral BID  . levalbuterol  0.63 mg Nebulization TID  . metoprolol tartrate  25 mg Oral BID  . warfarin  7.5 mg Oral ONCE-1800  . Warfarin - Pharmacist Dosing Inpatient   Does not apply q1800   Continuous Infusions: . sodium chloride Stopped (12/23/17 0344)  . sodium chloride 125 mL/hr at 12/25/17 0400  . cefTRIAXone (ROCEPHIN)  IV 2 g (12/25/17 1741)   PRN Meds:.sodium chloride, acetaminophen    Anti-infectives (From admission, onward)   Start     Dose/Rate Route Frequency Ordered Stop   12/24/17 1000  azithromycin (ZITHROMAX) tablet 500 mg     500 mg Oral Every 24 hours 12/23/17  0115 12/31/17 0959   12/24/17 0100  cefTRIAXone (ROCEPHIN) 1 g in sodium chloride 0.9 % 100 mL IVPB  Status:  Discontinued     1 g 200 mL/hr over 30 Minutes Intravenous Every 24 hours 12/23/17 0115 12/23/17 1453   12/23/17 1600  cefTRIAXone (ROCEPHIN) 2 g in sodium chloride 0.9 % 100 mL IVPB     2 g 200 mL/hr over 30 Minutes Intravenous Every 24 hours 12/23/17 1453 12/30/17 1559   12/23/17 0000  cefTRIAXone (ROCEPHIN) 1 g in sodium chloride 0.9 % 100 mL IVPB     1 g 200 mL/hr over 30 Minutes Intravenous  Once 12/22/17 2349 12/23/17 0133   12/23/17 0000  azithromycin (ZITHROMAX) 500 mg in sodium chloride 0.9  % 250 mL IVPB     500 mg 250 mL/hr over 60 Minutes Intravenous  Once 12/22/17 2349 12/23/17 0134        Objective:   Vitals:   12/25/17 0412 12/25/17 0809 12/25/17 1100 12/25/17 1620  BP: 140/86 132/76 120/81 114/80  Pulse: 98 (!) 128 100   Resp: (!) 30 (!) 22 (!) 27 (!) 26  Temp: 98.6 F (37 C) 99.1 F (37.3 C) 98.4 F (36.9 C) 99.1 F (37.3 C)  TempSrc: Oral Oral Oral Oral  SpO2: 90% 94% 95% 91%  Weight: 120.7 kg (266 lb 1.5 oz)     Height:        Wt Readings from Last 3 Encounters:  12/25/17 120.7 kg (266 lb 1.5 oz)  10/14/17 115.7 kg (255 lb)  09/23/16 122.5 kg (270 lb)     Intake/Output Summary (Last 24 hours) at 12/25/2017 1858 Last data filed at 12/25/2017 1825 Gross per 24 hour  Intake 2132.21 ml  Output 3425 ml  Net -1292.79 ml     Physical Exam  Gen:- Awake Alert,  In no apparent distress  HEENT:- [place].AT, No sclera icterus Neck-Supple Neck,No JVD,.  Lungs-improving air movement, no wheezing CV- S1, S2 normal, irregularly irregular, heart rate in the  130s Abd-  +ve B.Sounds, Abd Soft, No tenderness,    Extremity/Skin:-1+ pitting edema, good pulses Psych-affect is appropriate, oriented x3 Neuro-no new focal deficits, no tremors   Data Review:   Micro Results Recent Results (from the past 240 hour(s))  Culture, blood (Routine x 2)     Status: Abnormal (Preliminary result)   Collection Time: 12/22/17 10:37 PM  Result Value Ref Range Status   Specimen Description BLOOD LEFT HAND  Final   Special Requests   Final    BOTTLES DRAWN AEROBIC AND ANAEROBIC Blood Culture adequate volume   Culture  Setup Time (A)  Final    GRAM VARIABLE ROD IN BOTH AEROBIC AND ANAEROBIC BOTTLES CRITICAL RESULT CALLED TO, READ BACK BY AND VERIFIED WITH: Karlene Einstein PharmD 14:50 12/23/17 ([email or web address]) Performed at [facility], [address] [place], [place] [address]    Culture ESCHERICHIA COLI (A)  Final   Report Status PENDING  Incomplete   Organism ID, Bacteria  ESCHERICHIA COLI  Final      Susceptibility   Escherichia coli - MIC*    AMPICILLIN >=32 RESISTANT Resistant     CEFAZOLIN <=4 SENSITIVE Sensitive     CEFEPIME <=1 SENSITIVE Sensitive     CEFTAZIDIME <=1 SENSITIVE Sensitive     CEFTRIAXONE <=1 SENSITIVE Sensitive     CIPROFLOXACIN <=0.25 SENSITIVE Sensitive     GENTAMICIN <=1 SENSITIVE Sensitive     IMIPENEM <=0.25 SENSITIVE Sensitive     TRIMETH/SULFA >=320  RESISTANT Resistant     AMPICILLIN/SULBACTAM 16 INTERMEDIATE Intermediate     PIP/TAZO 8 SENSITIVE Sensitive     Extended ESBL NEGATIVE Sensitive     * ESCHERICHIA COLI  Blood Culture ID Panel (Reflexed)     Status: Abnormal   Collection Time: 12/22/17 10:37 PM  Result Value Ref Range Status   Enterococcus species NOT DETECTED NOT DETECTED Final   Listeria monocytogenes NOT DETECTED NOT DETECTED Final   Staphylococcus species NOT DETECTED NOT DETECTED Final   Staphylococcus aureus NOT DETECTED NOT DETECTED Final   Streptococcus species NOT DETECTED NOT DETECTED Final   Streptococcus agalactiae NOT DETECTED NOT DETECTED Final   Streptococcus pneumoniae NOT DETECTED NOT DETECTED Final   Streptococcus pyogenes NOT DETECTED NOT DETECTED Final   Acinetobacter baumannii NOT DETECTED NOT DETECTED Final   Enterobacteriaceae species DETECTED (A) NOT DETECTED Final    Comment: Enterobacteriaceae represent a large family of gram-negative bacteria, not a single organism. CRITICAL RESULT CALLED TO, READ BACK BY AND VERIFIED WITH: Karlene Einstein PharmD 14:50 12/23/17 (wilsonm)    Enterobacter cloacae complex NOT DETECTED NOT DETECTED Final   Escherichia coli DETECTED (A) NOT DETECTED Final    Comment: CRITICAL RESULT CALLED TO, READ BACK BY AND VERIFIED WITH: Karlene Einstein PharmD 14:50 12/23/17 ([email or web address])    Klebsiella oxytoca NOT DETECTED NOT DETECTED Final   Klebsiella pneumoniae NOT DETECTED NOT DETECTED Final   Proteus species NOT DETECTED NOT DETECTED Final   Serratia marcescens NOT  DETECTED NOT DETECTED Final   Carbapenem resistance NOT DETECTED NOT DETECTED Final   Haemophilus influenzae NOT DETECTED NOT DETECTED Final   Neisseria meningitidis NOT DETECTED NOT DETECTED Final   Pseudomonas aeruginosa NOT DETECTED NOT DETECTED Final   Candida albicans NOT DETECTED NOT DETECTED Final   Candida glabrata NOT DETECTED NOT DETECTED Final   Candida krusei NOT DETECTED NOT DETECTED Final   Candida parapsilosis NOT DETECTED NOT DETECTED Final   Candida tropicalis NOT DETECTED NOT DETECTED Final    Comment: Performed at [facility], [place] [address], [place], [place] [address]  Culture, blood (Routine x 2)     Status: Abnormal (Preliminary result)   Collection Time: 12/22/17 10:43 PM  Result Value Ref Range Status   Specimen Description BLOOD RIGHT HAND  Final   Special Requests   Final    BOTTLES DRAWN AEROBIC AND ANAEROBIC Blood Culture adequate volume   Culture  Setup Time (A)  Final    GRAM VARIABLE ROD IN BOTH AEROBIC AND ANAEROBIC BOTTLES CRITICAL VALUE NOTED.  VALUE IS CONSISTENT WITH PREVIOUSLY REPORTED AND CALLED VALUE. Performed at [facility], [place] [address] [place], [place] [address]    Culture ESCHERICHIA COLI (A)  Final   Report Status PENDING  Incomplete  Urine Culture     Status: None   Collection Time: 12/23/17 12:41 AM  Result Value Ref Range Status   Specimen Description URINE, CLEAN CATCH  Final   Special Requests NONE  Final   Culture   Final    NO GROWTH Performed at [facility], [place] [address] [place], [place] [address]    Report Status 12/24/2017 FINAL  Final    Radiology Reports Dg Chest 2 View  Result Date: 12/25/2017 CLINICAL DATA:  Shortness of breath EXAM: CHEST - 2 VIEW COMPARISON:  December 22, 2017 FINDINGS: There is no edema or consolidation. There is mild bibasilar atelectasis. Heart is upper normal in size with pulmonary vascularity normal. No adenopathy. There old  healed rib fractures on the right. IMPRESSION:  Slight bibasilar atelectasis. No edema or consolidation. Stable cardiac silhouette. Electronically Signed   By: Lowella Grip III M.D.   On: 12/25/2017 08:52   Mr Jeri Cos MH Contrast  Result Date: 12/10/2017 CLINICAL DATA:  Slurred speech and gait disturbance over the last 3 months. Hearing loss. Weakness. Creatinine was obtained on site at [facility] at [address] Results: Creatinine 1.2 mg/dL. EXAM: MRI HEAD WITHOUT AND WITH CONTRAST TECHNIQUE: Multiplanar, multiecho pulse sequences of the brain and surrounding structures were obtained without and with intravenous contrast. CONTRAST:  46mL MULTIHANCE GADOBENATE DIMEGLUMINE 529 MG/ML IV SOLN COMPARISON:  None. FINDINGS: Brain: Diffusion imaging does not show any acute or subacute infarction. The brainstem and cerebellum are normal. Cerebral hemispheres show moderate chronic small-vessel ischemic changes of the deep and subcortical white matter. No cortical or large vessel territory infarction. No mass lesion, hemorrhage, hydrocephalus or extra-axial collection. After contrast administration, no abnormal enhancement occurs. Vascular: Major vessels at the base of the brain show flow. Skull and upper cervical spine: Negative Sinuses/Orbits: Clear/normal Other: None IMPRESSION: No acute or reversible finding. No specific cause of the presenting symptoms is identified. Moderate chronic small-vessel ischemic changes of the cerebral hemispheric white matter. Electronically Signed   By: Nelson Chimes M.D.   On: 12/10/2017 09:29   Dg Chest Portable 1 View  Result Date: 12/22/2017 CLINICAL DATA:  Shortness of breath.  Atrial fibrillation, fever. EXAM: PORTABLE CHEST 1 VIEW COMPARISON:  Radiograph 09/23/2016 FINDINGS: Unchanged cardiomegaly and mediastinal contours. Patchy retrocardiac consolidation. Streaky right infrahilar opacities. No pulmonary edema. Possible small left pleural effusion. No pneumothorax. Patient's chin obscures the apices.  Remote right rib fractures. IMPRESSION: Patchy left lung base opacity may represent pneumonia in the setting of fever. Streaky right infrahilar atelectasis. Consider PA and lateral views when patient is able. Electronically Signed   By: Jeb Levering M.D.   On: 12/22/2017 22:43   US Liver Doppler  Result Date: 12/25/2017 CLINICAL DATA:  Elevated liver function tests. EXAM: DUPLEX ULTRASOUND OF LIVER TECHNIQUE: Color and duplex Doppler ultrasound was performed to evaluate the hepatic in-flow and out-flow vessels. COMPARISON:  None. FINDINGS: Liver: Normal parenchymal echogenicity. Normal hepatic contour without nodularity. No focal lesion, mass or intrahepatic biliary ductal dilatation. Portal Vein Velocities Main:  26.6 cm/sec Right:  21.0 cm/sec Left:  18.7 cm/sec Normal hepatopetal flow is noted in the portal veins. Hepatic Vein Velocities Right:  12.7 cm/sec Middle:  35.7 cm/sec Left:  27.0 cm/sec Normal hepatofugal flow is noted in the hepatic veins. IVC: Present and patent with normal respiratory phasicity. Hepatic Artery Velocity:  41.0 cm/sec Splenic Vein Velocity:  22.0 cm/sec Varices: Absent. Ascites: Absent. Spleen appears to be normal in size and appearance. IMPRESSION: No evidence of portal, hepatic or splenic venous thrombosis or occlusion. No definite abnormality seen in the right upper quadrant of the abdomen. Electronically Signed   By: Marijo Conception, M.D.   On: 12/25/2017 15:36     CBC Recent Labs  Lab 12/22/17 2233 12/23/17 0331 12/25/17 0319  WBC 6.9 7.3 4.8  HGB 13.8 13.1 14.3  HCT 41.0 40.4 43.3  PLT 101* 94* 107*  MCV 97.6 100.0 96.9  MCH 32.9 32.4 32.0  MCHC 33.7 32.4 33.0  RDW 14.0 14.4 14.2    Chemistries  Recent Labs  Lab 12/22/17 2233 12/23/17 0330 12/23/17 0331 12/25/17 0319 12/25/17 0843  NA 138  --  140 139  --  K 3.5  --  3.5 3.4*  --   CL 110  --  113* 104  --   CO2 22  --  20* 24  --   GLUCOSE 163*  --  192* 103*  --   BUN 16  --  13 9  --     CREATININE 1.12  --  1.03 0.93  --   CALCIUM 8.1*  --  7.8* 8.3*  --   MG  --   --   --   --  1.9  AST  --  75*  --  53*  --   ALT  --  70*  --  60*  --   ALKPHOS  --  82  --  95  --   BILITOT  --  1.6*  --  1.2  --    ------------------------------------------------------------------------------------------------------------------ No results for input(s): CHOL, HDL, LDLCALC, TRIG, CHOLHDL, LDLDIRECT in the last 72 hours.  Lab Results  Component Value Date   HGBA1C 6.3 11/21/2015   ------------------------------------------------------------------------------------------------------------------ No results for input(s): TSH, T4TOTAL, T3FREE, THYROIDAB in the last 72 hours.  Invalid input(s): FREET3 ------------------------------------------------------------------------------------------------------------------ No results for input(s): VITAMINB12, FOLATE, FERRITIN, TIBC, IRON, RETICCTPCT in the last 72 hours.  Coagulation profile Recent Labs  Lab 12/22/17 2233 12/23/17 0330 12/24/17 0436 12/25/17 0319  INR 2.96 3.16 2.01 1.43    No results for input(s): DDIMER in the last 72 hours.  Cardiac Enzymes No results for input(s): CKMB, TROPONINI, MYOGLOBIN in the last 168 hours.  Invalid input(s): CK ------------------------------------------------------------------------------------------------------------------    Component Value Date/Time   BNP 99.5 11/21/2015 0849     Roxan Hockey M.D on 12/25/2017 at 6:58 PM  Between 7am to 7pm - Pager - [phone]  After 7pm go to [email or web address] - password [facility]  [facility] -  Office  [phone]

## 2017-12-25 NOTE — Progress Notes (Signed)
Patient refuses the use of CPAP. RT informed patient if he changes his mind have RN contact RT.

## 2017-12-25 NOTE — Consult Note (Signed)
   [facility] CM Inpatient Consult   12/25/2017  MAMIE DIIORIO 13-Oct-1945 [id number]   Currently off the unit.  Patient currently at [facility] level. Patient in the [employer] plan. Will follow up as time allows.  Patient admitted with HCAP.   Natividad Brood, RN BSN [facility] Liaison  [phone] business mobile phone Toll free office [phone]

## 2017-12-25 NOTE — Progress Notes (Signed)
Pt heart rate since shift change this morning has been ranging from 120s to 150s, pt is not noted to be in any acute distress at this time. Paged attending MD Dr. Denton Brick to awaiting returned call or new orders from him. Pt was just sitting in his chair this morning with son at bedside. Pt denies any pain at this time. Will continue to monitor pt status at this time. Pt was placed bak on 2 liters of oxygen via nasal cannula.

## 2017-12-25 NOTE — Consult Note (Signed)
[facility] CM Primary Care Navigator  12/25/2017  Stanley Wright April 18, 1946 [id number]    Met with patientatthe bedside to identify possible discharge needs.  Patient reportshaving"fever/ chills and weakness- almost passed out"which had ledto this admission.(sepsis, community-acquired pneumonia of left lower lobe of lung)  Patient endorsesDr. Merri Ray with [facility]  as hisprimary care provider although he mentioned that he started to see Martyn Ehrich, PA at [facility] 3 weeks prior to this admission.   [facility] [address] [facility] without difficulty.   Patient reportsmanaginghisownmedicationsat homeusing "pill box" system filled once a week.  Patientmentioned thathe has been driving prior to admission, butdaughter [facility]) can provide transportationto hisdoctors' appointmentsif needed after discharge.  Patient livesalone and independent with self care but daughter (lives next door) or son Quillian Quince- lives close by) will be able to provide him assistance when needed, per patient.  Anticipated dischargeplanishome with Outpatient rehab per therapy recommendation.  Patientvoicedunderstanding to see primary care providerfor a post discharge follow-up appointment within 1- 2 weeksor sooner if needs arise.Patient letter (with PCP's contact number) was provided ashisreminder.  Explained to patientregardingTHN CM services available for health management at home buthe denies any current needs or concerns at this time. However, patient had  verbally agreedforEMMI Pneumonia calls tofollow-up with his recovery.  Referralmade forEMMI Pneumonia Calls after discharge.  Patientexpressed understandingto seekreferral to [facility] care managementfrom primary care provider ifdeemed necessary and appropriateforanyservices in thefuture.  [facility] care  management information provided for future needs thatpatientmay have.    For additional questions please contact:  Edwena Felty A. Shamon Lobo, BSN, RN-BC [facility] PRIMARY CARE Navigator Cell: [phone]

## 2017-12-26 DIAGNOSIS — Z9989 Dependence on other enabling machines and devices: Secondary | ICD-10-CM

## 2017-12-26 DIAGNOSIS — G4733 Obstructive sleep apnea (adult) (pediatric): Secondary | ICD-10-CM

## 2017-12-26 LAB — CBC
HEMATOCRIT: 40.8 % (ref 39.0–52.0)
Hemoglobin: 13.6 g/dL (ref 13.0–17.0)
MCH: 32.2 pg (ref 26.0–34.0)
MCHC: 33.3 g/dL (ref 30.0–36.0)
MCV: 96.7 fL (ref 78.0–100.0)
PLATELETS: 118 10*3/uL — AB (ref 150–400)
RBC: 4.22 MIL/uL (ref 4.22–5.81)
RDW: 14.2 % (ref 11.5–15.5)
WBC: 4.7 10*3/uL (ref 4.0–10.5)

## 2017-12-26 LAB — CULTURE, BLOOD (ROUTINE X 2)
SPECIAL REQUESTS: ADEQUATE
Special Requests: ADEQUATE

## 2017-12-26 LAB — COMPREHENSIVE METABOLIC PANEL
ALT: 53 U/L — ABNORMAL HIGH (ref 0–44)
AST: 37 U/L (ref 15–41)
Albumin: 2.9 g/dL — ABNORMAL LOW (ref 3.5–5.0)
Alkaline Phosphatase: 89 U/L (ref 38–126)
Anion gap: 8 (ref 5–15)
BILIRUBIN TOTAL: 0.9 mg/dL (ref 0.3–1.2)
BUN: 11 mg/dL (ref 8–23)
CO2: 26 mmol/L (ref 22–32)
CREATININE: 0.95 mg/dL (ref 0.61–1.24)
Calcium: 8.5 mg/dL — ABNORMAL LOW (ref 8.9–10.3)
Chloride: 107 mmol/L (ref 98–111)
GFR calc Af Amer: >60 mL/min (ref >60)
Glucose, Bld: 102 mg/dL — ABNORMAL HIGH (ref 70–99)
Potassium: 3.9 mmol/L (ref 3.5–5.1)
Sodium: 141 mmol/L (ref 135–145)
TOTAL PROTEIN: 6.2 g/dL — AB (ref 6.5–8.1)

## 2017-12-26 LAB — PROTIME-INR
INR: 1.41
PROTHROMBIN TIME: 17.1 s — AB (ref 11.4–15.2)

## 2017-12-26 MED ORDER — CEFDINIR 300 MG PO CAPS: 300.0000 mg | ORAL_CAPSULE | Freq: Two times a day (BID) | ORAL | 0 refills | Status: DC

## 2017-12-26 MED ORDER — DILTIAZEM HCL ER COATED BEADS 360 MG PO CP24: 360.0000 mg | ORAL_CAPSULE | Freq: Every day | ORAL | 3 refills | Status: DC

## 2017-12-26 MED ORDER — WARFARIN SODIUM 6 MG PO TABS: 6.0000 mg | ORAL_TABLET | Freq: Every day | ORAL | 1 refills | Status: DC

## 2017-12-26 MED ORDER — METOPROLOL TARTRATE 25 MG PO TABS: 25.0000 mg | ORAL_TABLET | Freq: Two times a day (BID) | ORAL | 1 refills | Status: DC

## 2017-12-26 MED ORDER — WARFARIN SODIUM 3 MG PO TABS: 6.0000 mg | ORAL_TABLET | Freq: Once | ORAL | Status: DC

## 2017-12-26 MED ORDER — GUAIFENESIN ER 600 MG PO TB12: 600.0000 mg | ORAL_TABLET | Freq: Two times a day (BID) | ORAL | 0 refills | Status: DC

## 2017-12-26 MED ORDER — ALBUTEROL SULFATE HFA 108 (90 BASE) MCG/ACT IN AERS: 1.0000 | INHALATION_SPRAY | Freq: Four times a day (QID) | RESPIRATORY_TRACT | 0 refills | Status: DC | PRN

## 2017-12-26 MED ORDER — AZITHROMYCIN 500 MG PO TABS: 500.0000 mg | ORAL_TABLET | Freq: Every day | ORAL | 0 refills | Status: DC

## 2017-12-26 NOTE — Progress Notes (Signed)
Pt given AVS handout . Pt verbalized understanding. All questions answered. Peripheral IV's removed. Pt stable at time of discharge. Pt transported via wheelchair by NT.

## 2017-12-26 NOTE — Progress Notes (Signed)
Stanley Wright was walked in the hall for approximately 150 feet.  Beginning VS, B/P 129/101, HR 109.  Mid VS, B/P 139/100 HR varied between 82-109.  End VS. B/P 125/99 HR 96.  Tolerated well.

## 2017-12-26 NOTE — Plan of Care (Signed)
Care plans reviewed and patient is progressing.  

## 2017-12-26 NOTE — Evaluation (Signed)
Occupational Therapy Evaluation Patient Details Name: Stanley Wright MRN: [id number] DOB: April 29, 1946 Today's Date: 12/26/2017    History of Present Illness Pt adm with sepsis due to PNA, afib with RVR. Pt with recent return from mission trip to [place]. Question of dengue fever but that thought unlikely. PMH - afib, DVT, PE, arthritis   Clinical Impression   Pt admitted with the above, he demonstrates the below listed deficits.  He is able to perform ADLs mod I, but demonstrates obvious balance deficits, which today he will acknowledge.  Reinforced PTs recommendations for AD when ambulating, and today, pt states he will give it some thought.  Pt has a walk in shower with built in seat, but he uses tub/shower.  Recommend he use walk in shower, and he states he likely will. Reviewed energy conservation strategies.  He reports he plans to follow up with OPOT for his balance, and has an appointment with neurology in Sept due to progressive weakness and voice changes over the past 6-9 mos.   All education completed.   No further acute OT needs.      Follow Up Recommendations  No OT follow up;Supervision - Intermittent    Equipment Recommendations  None recommended by OT    Recommendations for Other Services       Precautions / Restrictions Precautions Precautions: Fall      Mobility Bed Mobility                  Transfers Overall transfer level: Modified independent               General transfer comment: Pt ambulating in room.  He does loose balance with turns, but does catch himself.   Today he acknowledges that he has fallen and often has to catch himself due to impaired balance.  Reiterated PT recommendations to use RW at home, and he states he will consider using that or a cane.  Discussed risk of falls, and he states he is well aware of the risks, and knows he's at risk for falls.       Balance Overall balance assessment: Needs assistance Sitting-balance support:  No upper extremity supported;Feet supported Sitting balance-Leahy Scale: Good     Standing balance support: During functional activity Standing balance-Leahy Scale: Fair                             ADL either performed or assessed with clinical judgement   ADL Overall ADL's : At baseline;Modified independent                                       General ADL Comments: Pt with noted balance deficits, which he acknowledges.  He has a tub shower combo that he uses for showers, and uses towel rack to stabilize himself during transfer - cautioned him against this and discussed the risks of it not holding him.  He does have  walk in shower - discussed recommendation to use this instead of tub, and he states he will consider it.   He has built in seat in walk in shower.   Discussed in length, risk of falls in shower, and he verbalized understanding      Vision Baseline Vision/History: Wears glasses Wears Glasses: At all times Patient Visual Report: No change from baseline       Perception  Praxis      Pertinent Vitals/Pain Pain Assessment: No/denies pain     Hand Dominance Right   Extremity/Trunk Assessment Upper Extremity Assessment Upper Extremity Assessment: Generalized weakness   Lower Extremity Assessment Lower Extremity Assessment: Defer to PT evaluation   Cervical / Trunk Assessment Cervical / Trunk Assessment: Normal   Communication Communication Communication: HOH   Cognition Arousal/Alertness: Awake/alert Behavior During Therapy: WFL for tasks assessed/performed Overall Cognitive Status: Within Functional Limits for tasks assessed                                     General Comments  discussed energy conservation techniques and need to take rest breaks if he fatigues.  He voiced understanding.  Pt acknowledges he has had a significant decline in function over the past 6-9 mos including decreased balance, decreased  breath support, voice changes, sleep disturbance, lability, among other things.  He states his primary care MD is aware, and he has an appointment with neurology in Sept.     Exercises     Shoulder Instructions      Home Living Family/patient expects to be discharged to:: Private residence Living Arrangements: Alone Available Help at Discharge: Family;Available PRN/intermittently Type of Home: House Home Access: Stairs to enter [employer] of Steps: 3 Entrance Stairs-Rails: None Home Layout: One level;Other (Comment)     Bathroom Shower/Tub: Walk-in shower;Tub/shower unit   [employer]: Standard     Home Equipment: Shower seat - built in   Additional Comments: he says he has access to walkers and canes       Prior Functioning/Environment Level of Independence: Independent        Comments: Amb without assistive device. Pt denies falls but daughter reports multiple falls.        OT Problem List: Impaired balance (sitting and/or standing);Decreased activity tolerance;Decreased strength      OT Treatment/Interventions:      OT Goals(Current goals can be found in the care plan section) Acute Rehab OT Goals Patient Stated Goal: go home today  OT Goal Formulation: All assessment and education complete, DC therapy  OT Frequency:     Barriers to D/C:            Co-evaluation              AM-PAC PT "6 Clicks" Daily Activity     Outcome Measure Help from another person eating meals?: None Help from another person taking care of personal grooming?: None Help from another person toileting, which includes using toliet, bedpan, or urinal?: None Help from another person bathing (including washing, rinsing, drying)?: None Help from another person to put on and taking off regular upper body clothing?: None Help from another person to put on and taking off regular lower body clothing?: None 6 Click Score: 24   End of Session    Activity Tolerance:  Patient tolerated treatment well Patient left: in chair;with call bell/phone within reach  OT Visit Diagnosis: Unsteadiness on feet (R26.81)                Time: 1610-9604 OT Time Calculation (min): 30 min Charges:  OT General Charges $OT Visit: 1 Visit OT Evaluation $OT Eval Moderate Complexity: 1 Mod OT Treatments $Self Care/Home Management : 8-22 mins G-Codes:     [facility], OTR/L [phone]   Lucille Passy M 12/26/2017, 3:55 PM

## 2017-12-26 NOTE — Discharge Instructions (Signed)
1)While on Coumadin/Warfarin Avoid ibuprofen/Advil/Aleve/Motrin/Goody Powders/Naproxen/BC powders/Meloxicam/Diclofenac/Indomethacin and other Nonsteroidal anti-inflammatory medications as these will make you more likely to bleed and can cause stomach ulcers, can also cause Kidney problems.   2) repeat PT/INR test on 12/28/2017, your primary care doctor will adjust your Coumadin dosage after that   3) take Coumadin 6 mg every evening until your primary care doctor changes your dosage after your PT/INR result is available on 12/28/2017  4) please note that your Cardizem has been increased to 360 mg daily due to fast heart rate  5)Very low-salt diet advised

## 2017-12-26 NOTE — Discharge Summary (Signed)
Stanley Wright, is a 72 y.o. male  DOB [birth year]  MRN [id number].  Admission date:  12/22/2017  Admitting Physician  Etta Quill, DO  Discharge Date:  12/26/2017   Primary MD  Wendie Agreste, MD  Recommendations for primary care physician for things to follow:   1)While on Coumadin/Warfarin Avoid ibuprofen/Advil/Aleve/Motrin/Goody Powders/Naproxen/BC powders/Meloxicam/Diclofenac/Indomethacin and other Nonsteroidal anti-inflammatory medications as these will make you more likely to bleed and can cause stomach ulcers, can also cause Kidney problems.   2)Repeat PT/INR test on 12/28/2017, your primary care doctor will adjust your Coumadin dosage after that   3)Take Coumadin 6 mg every evening until your primary care doctor changes your dosage after your PT/INR result is available on 12/28/2017  4)Please note that your Cardizem has been increased to 360 mg daily due to fast heart rate  5)Very low-salt diet advised    Admission Diagnosis  Atrial fibrillation with RVR ([place]) [I48.91] Sepsis, due to unspecified organism ([place]) [A41.9] Community acquired pneumonia of right lower lobe of lung ([place]) [J18.1] Community acquired pneumonia of left lower lobe of lung ([place]) [J18.1]   Discharge Diagnosis  Atrial fibrillation with RVR ([place]) [I48.91] Sepsis, due to unspecified organism ([place]) [A41.9] Community acquired pneumonia of right lower lobe of lung ([place]) [J18.1] Community acquired pneumonia of left lower lobe of lung ([place]) [J18.1]    Principal Problem:   Sepsis ([place]) Active Problems:   Persistent atrial fibrillation ([place])   OSA on CPAP   Community acquired pneumonia of left lower lobe of lung ([place])      Past Medical History:  Diagnosis Date  . Arthritis   . BENIGN PROSTATIC HYPERTROPHY, WITH OBSTRUCTION   . Cancer (HCC)    skin, basal, squamous  . COLONIC POLYPS   . Diverticulitis   . DVT (deep  venous thrombosis) ([place]) 2000  . Dysrhythmia    Hx Afib- 2017  . Factor 5 Leiden mutation, heterozygous ([place])   . GERD (gastroesophageal reflux disease)    not on medication  . HIATAL HERNIA   . ITP (idiopathic thrombocytopenic purpura) 2003  . Long term current use of anticoagulant   . PSORIASIS   . Pulmonary embolus ([place]) 2000  . Shortness of breath dyspnea    at times - Talking alot  . Sleep apnea     Past Surgical History:  Procedure Laterality Date  . CARDIOVERSION N/A 11/22/2015   Procedure: CARDIOVERSION;  Surgeon: Sanda Klein, MD;  Location: [facility];  Service: Cardiovascular;  Laterality: N/A;  . COLONOSCOPY    . HERNIA REPAIR Left    Inguinal- x2 . mesh x1  . INSERTION OF MESH N/A 02/25/2016   Procedure: INSERTION OF MESH;  Surgeon: Rolm Bookbinder, MD;  Location: [facility];  Service: General;  Laterality: N/A;  . LUNG REMOVAL, PARTIAL Right 2004  . PROSTATE SURGERY    . UMBILICAL HERNIA REPAIR N/A 02/25/2016   Procedure: LAPAROSCOPIC UMBILICAL HERNIA REPAIR;  Surgeon: Rolm Bookbinder, MD;  Location: [facility];  Service: General;  Laterality: N/A;  HPI  from the history and physical done on the day of admission:     Chief Complaint: Fever  HPI: Stanley Wright is a 72 y.o. 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  Patient recently returned from [place] x2 days ago where there was an outbreak of dengue.  Patient reports that the fever started yesterday. He has been running a fever and feeling achy all over, denies cough, shortness of breath, abdominal pain, vomiting, diarrhea.  Brought to ED by EMS.  A.Fib RVR with rate 180, improved with 5mg  IV lopressor.  ED Course: Tm 102.  CXR shows possible early LLL PNA.  INR 2.9.  Platelets 101.  WBC 6.9.  BPs low 100s.  Dr. Megan Salon, on-call for infectious disease.  Recommends treating the pneumonia, no additional therapy  necessary at this time based on the patient's recent travel.      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 azithromycin for an additional 3 days and p.o. Omnicef for an additional 5 days  2) E. coli Bacteremia --  blood cultures with E. coli , UA does not suggest UTI, urine culture negative,  D/w Dr Megan Salon  ID ---   treated with IV Rocephin from 12/23/17 through 12/26/2017 inclusive, and Discharged home on [place] for an additional 5 days  of antibiotics   3)Elevated LFTs- recent travel to [place], acute viral hepatitis profile is negative, liver ultrasound without significant abnormalities,  LFTs are trending down nicely , avoid hepatotoxic agents  4)Possible Tropical diseases- Recent travel to areas with endemic dengue hemorrhagic fevers, however low clinical index of suspicion for dengue hemorrhagic fever at this time, follow CBC including platelets closely, mild thrombocytopenia with platelet count around 100 K noted, repeat CBC with PCP on 12/28/2017  5) chronic A. Fib--- rate control much improved,, we increased Cardizem to 360 daily, continue metoprolol 25 mg p.o. twice daily , restart Coumadin for anticoagulation, recheck INR with PCP on 12/28/2017  Code Status : full  Disposition Plan  : home with home health RN and PT    Discharge Condition: stable  Follow UP--- PCP on 12/28/2017 for INR check    Consults obtained -curbside/phone conversation with  ID Diet and Activity recommendation:  As advised  Discharge Instructions     Discharge Instructions    Call MD for:  difficulty breathing, headache or visual disturbances   Complete by:  As directed    Call MD for:  persistant dizziness or light-headedness   Complete by:  As directed    Call MD for:  persistant nausea and vomiting   Complete by:  As directed    Call MD for:  severe uncontrolled pain   Complete by:  As directed    Call MD for:  temperature >100.4   Complete by:  As directed    Diet - low sodium heart healthy   Complete by:  As directed    Discharge instructions   Complete by:  As directed  1)While on Coumadin/Warfarin Avoid ibuprofen/Advil/Aleve/Motrin/Goody Powders/Naproxen/BC powders/Meloxicam/Diclofenac/Indomethacin and other Nonsteroidal anti-inflammatory medications as these will make you more likely to bleed and can cause stomach ulcers, can also cause Kidney problems.   2) repeat PT/INR test on 12/28/2017, your primary care doctor will adjust your Coumadin dosage after that   3) take Coumadin 6 mg every evening until your primary care doctor changes your dosage after your PT/INR result is available on 12/28/2017  4) please note that your Cardizem has been increased to 360 mg daily due to fast heart rate  5)Very low-salt diet advised   Increase activity slowly   Complete by:  As directed         Discharge Medications     Allergies as of 12/26/2017      Reactions   Tylenol [acetaminophen] Other (See Comments)   Pt states he had a DNA test completed that stated he should never take tylenol.      Medication List    STOP taking these medications   diltiazem 300 MG 24 hr capsule Commonly known as:  TIAZAC     TAKE these medications   albuterol 108 (90 Base) MCG/ACT inhaler Commonly known as:  PROVENTIL HFA;VENTOLIN HFA Inhale 1 puff into the lungs every 6 (six) hours as needed for wheezing or shortness of breath.   azithromycin 500 MG  tablet Commonly known as:  ZITHROMAX Take 1 tablet (500 mg total) by mouth daily.   cefdinir 300 MG capsule Commonly known as:  OMNICEF Take 1 capsule (300 mg total) by mouth 2 (two) times daily.   diltiazem 360 MG 24 hr capsule Commonly known as:  CARDIZEM CD Take 1 capsule (360 mg total) by mouth daily. Start taking on:  12/27/2017   guaiFENesin 600 MG 12 hr tablet Commonly known as:  MUCINEX Take 1 tablet (600 mg total) by mouth 2 (two) times daily.   metoprolol tartrate 25 MG tablet Commonly known as:  LOPRESSOR Take 1 tablet (25 mg total) by mouth 2 (two) times daily.   traMADol 50 MG tablet Commonly known as:  ULTRAM Take 1 tablet (50 mg total) by mouth every 6 (six) hours as needed. What changed:  reasons to take this   warfarin 5 MG tablet Commonly known as:  COUMADIN Take as directed. If you are unsure how to take this medication, talk to your nurse or doctor. Original instructions:  TAKE 1 TABLET BY MOUTH ONCE DAILY OR  AS  DIRECTED  BY  COUMADIN  CLINIC What changed:  Another medication with the same name was added. Make sure you understand how and when to take each.   warfarin 6 MG tablet Commonly known as:  COUMADIN Take as directed. If you are unsure how to take this medication, talk to your nurse or doctor. Original instructions:  Take 1 tablet (6 mg total) by mouth daily at 6 PM. Until INR check on 12/28/17 What changed:  You were already taking a medication with the same name, and this prescription was added. Make sure you understand how and when to take each.       Major procedures and Radiology Reports - PLEASE review detailed and final reports for all details, in brief -   Dg Chest 2 View  Result Date: 12/25/2017 CLINICAL DATA:  Shortness of breath EXAM: CHEST - 2 VIEW COMPARISON:  December 22, 2017 FINDINGS: There is no edema or consolidation. There is mild bibasilar atelectasis. Heart is upper normal in size with pulmonary vascularity normal. No  adenopathy.  There old healed rib fractures on the right. IMPRESSION: Slight bibasilar atelectasis. No edema or consolidation. Stable cardiac silhouette. Electronically Signed   By: Lowella Grip III M.D.   On: 12/25/2017 08:52   Mr Jeri Cos XF Contrast  Result Date: 12/10/2017 CLINICAL DATA:  Slurred speech and gait disturbance over the last 3 months. Hearing loss. Weakness. Creatinine was obtained on site at [facility] at [address] Results: Creatinine 1.2 mg/dL. EXAM: MRI HEAD WITHOUT AND WITH CONTRAST TECHNIQUE: Multiplanar, multiecho pulse sequences of the brain and surrounding structures were obtained without and with intravenous contrast. CONTRAST:  46mL MULTIHANCE GADOBENATE DIMEGLUMINE 529 MG/ML IV SOLN COMPARISON:  None. FINDINGS: Brain: Diffusion imaging does not show any acute or subacute infarction. The brainstem and cerebellum are normal. Cerebral hemispheres show moderate chronic small-vessel ischemic changes of the deep and subcortical white matter. No cortical or large vessel territory infarction. No mass lesion, hemorrhage, hydrocephalus or extra-axial collection. After contrast administration, no abnormal enhancement occurs. Vascular: Major vessels at the base of the brain show flow. Skull and upper cervical spine: Negative Sinuses/Orbits: Clear/normal Other: None IMPRESSION: No acute or reversible finding. No specific cause of the presenting symptoms is identified. Moderate chronic small-vessel ischemic changes of the cerebral hemispheric white matter. Electronically Signed   By: Nelson Chimes M.D.   On: 12/10/2017 09:29   Dg Chest Portable 1 View  Result Date: 12/22/2017 CLINICAL DATA:  Shortness of breath.  Atrial fibrillation, fever. EXAM: PORTABLE CHEST 1 VIEW COMPARISON:  Radiograph 09/23/2016 FINDINGS: Unchanged cardiomegaly and mediastinal contours. Patchy retrocardiac consolidation. Streaky right infrahilar opacities. No pulmonary edema. Possible small left pleural effusion.  No pneumothorax. Patient's chin obscures the apices. Remote right rib fractures. IMPRESSION: Patchy left lung base opacity may represent pneumonia in the setting of fever. Streaky right infrahilar atelectasis. Consider PA and lateral views when patient is able. Electronically Signed   By: Jeb Levering M.D.   On: 12/22/2017 22:43   US Liver Doppler  Result Date: 12/25/2017 CLINICAL DATA:  Elevated liver function tests. EXAM: DUPLEX ULTRASOUND OF LIVER TECHNIQUE: Color and duplex Doppler ultrasound was performed to evaluate the hepatic in-flow and out-flow vessels. COMPARISON:  None. FINDINGS: Liver: Normal parenchymal echogenicity. Normal hepatic contour without nodularity. No focal lesion, mass or intrahepatic biliary ductal dilatation. Portal Vein Velocities Main:  26.6 cm/sec Right:  21.0 cm/sec Left:  18.7 cm/sec Normal hepatopetal flow is noted in the portal veins. Hepatic Vein Velocities Right:  12.7 cm/sec Middle:  35.7 cm/sec Left:  27.0 cm/sec Normal hepatofugal flow is noted in the hepatic veins. IVC: Present and patent with normal respiratory phasicity. Hepatic Artery Velocity:  41.0 cm/sec Splenic Vein Velocity:  22.0 cm/sec Varices: Absent. Ascites: Absent. Spleen appears to be normal in size and appearance. IMPRESSION: No evidence of portal, hepatic or splenic venous thrombosis or occlusion. No definite abnormality seen in the right upper quadrant of the abdomen. Electronically Signed   By: Marijo Conception, M.D.   On: 12/25/2017 15:36    Micro Results   Recent Results (from the past 240 hour(s))  Culture, blood (Routine x 2)     Status: Abnormal   Collection Time: 12/22/17 10:37 PM  Result Value Ref Range Status   Specimen Description BLOOD LEFT HAND  Final   Special Requests   Final    BOTTLES DRAWN AEROBIC AND ANAEROBIC Blood Culture adequate volume   Culture  Setup Time (A)  Final    GRAM VARIABLE ROD IN  BOTH AEROBIC AND ANAEROBIC BOTTLES CRITICAL RESULT CALLED TO, READ BACK BY  AND VERIFIED WITH: Karlene Einstein PharmD 14:50 12/23/17 ([email or web address]) Performed at [facility], [place] [address], [place], [place] [address]    Culture ESCHERICHIA COLI (A)  Final   Report Status 12/26/2017 FINAL  Final   Organism ID, Bacteria ESCHERICHIA COLI  Final      Susceptibility   Escherichia coli - MIC*    AMPICILLIN >=32 RESISTANT Resistant     CEFAZOLIN <=4 SENSITIVE Sensitive     CEFEPIME <=1 SENSITIVE Sensitive     CEFTAZIDIME <=1 SENSITIVE Sensitive     CEFTRIAXONE <=1 SENSITIVE Sensitive     CIPROFLOXACIN <=0.25 SENSITIVE Sensitive     GENTAMICIN <=1 SENSITIVE Sensitive     IMIPENEM <=0.25 SENSITIVE Sensitive     TRIMETH/SULFA >=320 RESISTANT Resistant     AMPICILLIN/SULBACTAM 16 INTERMEDIATE Intermediate     PIP/TAZO 8 SENSITIVE Sensitive     Extended ESBL NEGATIVE Sensitive     * ESCHERICHIA COLI  Blood Culture ID Panel (Reflexed)     Status: Abnormal   Collection Time: 12/22/17 10:37 PM  Result Value Ref Range Status   Enterococcus species NOT DETECTED NOT DETECTED Final   Listeria monocytogenes NOT DETECTED NOT DETECTED Final   Staphylococcus species NOT DETECTED NOT DETECTED Final   Staphylococcus aureus NOT DETECTED NOT DETECTED Final   Streptococcus species NOT DETECTED NOT DETECTED Final   Streptococcus agalactiae NOT DETECTED NOT DETECTED Final   Streptococcus pneumoniae NOT DETECTED NOT DETECTED Final   Streptococcus pyogenes NOT DETECTED NOT DETECTED Final   Acinetobacter baumannii NOT DETECTED NOT DETECTED Final   Enterobacteriaceae species DETECTED (A) NOT DETECTED Final    Comment: Enterobacteriaceae represent a large family of gram-negative bacteria, not a single organism. CRITICAL RESULT CALLED TO, READ BACK BY AND VERIFIED WITH: Karlene Einstein PharmD 14:50 12/23/17 ([email or web address])    Enterobacter cloacae complex NOT DETECTED NOT DETECTED Final   Escherichia coli DETECTED (A) NOT DETECTED Final    Comment: CRITICAL RESULT CALLED TO, READ BACK BY AND  VERIFIED WITH: Karlene Einstein PharmD 14:50 12/23/17 ([email or web address])    Klebsiella oxytoca NOT DETECTED NOT DETECTED Final   Klebsiella pneumoniae NOT DETECTED NOT DETECTED Final   Proteus species NOT DETECTED NOT DETECTED Final   Serratia marcescens NOT DETECTED NOT DETECTED Final   Carbapenem resistance NOT DETECTED NOT DETECTED Final   Haemophilus influenzae NOT DETECTED NOT DETECTED Final   Neisseria meningitidis NOT DETECTED NOT DETECTED Final   Pseudomonas aeruginosa NOT DETECTED NOT DETECTED Final   Candida albicans NOT DETECTED NOT DETECTED Final   Candida glabrata NOT DETECTED NOT DETECTED Final   Candida krusei NOT DETECTED NOT DETECTED Final   Candida parapsilosis NOT DETECTED NOT DETECTED Final   Candida tropicalis NOT DETECTED NOT DETECTED Final    Comment: Performed at [facility], [place] [address], [place], [place] [address]  Culture, blood (Routine x 2)     Status: Abnormal   Collection Time: 12/22/17 10:43 PM  Result Value Ref Range Status   Specimen Description BLOOD RIGHT HAND  Final   Special Requests   Final    BOTTLES DRAWN AEROBIC AND ANAEROBIC Blood Culture adequate volume   Culture  Setup Time (A)  Final    GRAM VARIABLE ROD IN BOTH AEROBIC AND ANAEROBIC BOTTLES CRITICAL VALUE NOTED.  VALUE IS CONSISTENT WITH PREVIOUSLY REPORTED AND CALLED VALUE.    Culture (A)  Final    ESCHERICHIA COLI SUSCEPTIBILITIES PERFORMED ON  PREVIOUS CULTURE WITHIN THE LAST 5 DAYS. Performed at [facility], [place] [address], [place], [place] [address]    Report Status 12/26/2017 FINAL  Final  Urine Culture     Status: None   Collection Time: 12/23/17 12:41 AM  Result Value Ref Range Status   Specimen Description URINE, CLEAN CATCH  Final   Special Requests NONE  Final   Culture   Final    NO GROWTH Performed at [facility], [place] [address], [place], [place] [address]    Report Status 12/24/2017 FINAL  Final       Today   Subjective    Stanley Wright today  has no new complaints, voiding well, no chest pains palpitations or significant dyspnea on exertion, Stanley Wright was walked in the hall for approximately 150 feet.  Beginning VS, B/P 129/101, HR 109.  Mid VS, B/P 139/100 HR varied between 82-109.  End VS. B/P 125/99 HR 96.  Tolerated well.       Patient has been seen and examined prior to discharge   Objective   Blood pressure (!) 135/99, pulse 81, temperature 98.9 F (37.2 C), temperature source Oral, resp. rate 17, height 6' (1.829 m), weight 118.8 kg (261 lb 14.5 oz), SpO2 94 %.   Intake/Output Summary (Last 24 hours) at 12/26/2017 1550 Last data filed at 12/26/2017 1400 Gross per 24 hour  Intake 1804.47 ml  Output 1975 ml  Net -170.53 ml    Exam  Gen:- Awake Alert,  In no apparent distress  HEENT:- [place].AT, No sclera icterus Neck-Supple Neck,No JVD,.  Lungs- improving air movement, no wheezing CV- S1, S2 normal, irregularly irregular, rate 90 Abd-  +ve B.Sounds, Abd Soft, No tenderness,    Extremity/Skin:- trace pitting edema, good pulses Psych-affect is appropriate, oriented x3 Neuro-no new focal deficits, no tremors     Data Review   CBC w Diff:  Lab Results  Component Value Date   WBC 4.7 12/26/2017   HGB 13.6 12/26/2017   HCT 40.8 12/26/2017   PLT 118 (L) 12/26/2017   LYMPHOPCT 36 02/19/2016   MONOPCT 10 02/19/2016   EOSPCT 3 02/19/2016   BASOPCT 1 02/19/2016    CMP:  Lab Results  Component Value Date   NA 141 12/26/2017   K 3.9 12/26/2017   CL 107 12/26/2017   CO2 26 12/26/2017   BUN 11 12/26/2017   CREATININE 0.95 12/26/2017   CREATININE 0.88 06/12/2015   PROT 6.2 (L) 12/26/2017   ALBUMIN 2.9 (L) 12/26/2017   BILITOT 0.9 12/26/2017   ALKPHOS 89 12/26/2017   AST 37 12/26/2017   ALT 53 (H) 12/26/2017  .   Total Discharge time is about 33 minutes  Roxan Hockey M.D on 12/26/2017 at 3:50 PM   Go to [email or web address] - password [email or web address] for contact info  [facility] - Office   [phone]

## 2017-12-26 NOTE — Progress Notes (Signed)
[facility] for warfarin Indication: afib/DVT  Allergies  Allergen Reactions  . Tylenol [Acetaminophen] Other (See Comments)    Pt states he had a DNA test completed that stated he should never take tylenol.    Patient Measurements: Height: 6' (182.9 cm) Weight: 261 lb 14.5 oz (118.8 kg) IBW/kg (Calculated) : 77.6   Vital Signs: Temp: 98.9 F (37.2 C) (07/13 0800) Temp Source: Oral (07/13 0800) BP: 123/93 (07/13 0437) Pulse Rate: 92 (07/13 0437)  Labs: Recent Labs    12/24/17 0436 12/25/17 0319 12/26/17 0347  HGB  --  14.3 13.6  HCT  --  43.3 40.8  PLT  --  107* 118*  LABPROT 22.6* 17.4* 17.1*  INR 2.01 1.43 1.41  CREATININE  --  0.93 0.95    Estimated Creatinine Clearance: 93.5 mL/min (by C-G formula based on SCr of 0.95 mg/dL).   Medical History: Past Medical History:  Diagnosis Date  . Arthritis   . BENIGN PROSTATIC HYPERTROPHY, WITH OBSTRUCTION   . Cancer (HCC)    skin, basal, squamous  . COLONIC POLYPS   . Diverticulitis   . DVT (deep venous thrombosis) ([place]) 2000  . Dysrhythmia    Hx Afib- 2017  . Factor 5 Leiden mutation, heterozygous ([place])   . GERD (gastroesophageal reflux disease)    not on medication  . HIATAL HERNIA   . ITP (idiopathic thrombocytopenic purpura) 2003  . Long term current use of anticoagulant   . PSORIASIS   . Pulmonary embolus ([place]) 2000  . Shortness of breath dyspnea    at times - Talking alot  . Sleep apnea     Medications:  Medications Prior to Admission  Medication Sig Dispense Refill Last Dose  . diltiazem (TIAZAC) 300 MG 24 hr capsule Take 1 capsule (300 mg total) by mouth daily. 90 capsule 0 12/22/2017 at Unknown time  . traMADol (ULTRAM) 50 MG tablet Take 1 tablet (50 mg total) by mouth every 6 (six) hours as needed. (Patient taking differently: Take 50 mg by mouth every 6 (six) hours as needed for moderate pain. ) 20 tablet 0 unk  . warfarin (COUMADIN) 5 MG tablet TAKE 1 TABLET  BY MOUTH ONCE DAILY OR  AS  DIRECTED  BY  COUMADIN  CLINIC 90 tablet 1 12/22/2017 at 1400   Scheduled:  . azithromycin  500 mg Oral Q24H  . diltiazem  360 mg Oral Daily  . enoxaparin (LOVENOX) injection  60 mg Subcutaneous Daily  . guaiFENesin  600 mg Oral BID  . levalbuterol  0.63 mg Nebulization TID  . metoprolol tartrate  25 mg Oral BID  . Warfarin - Pharmacist Dosing Inpatient   Does not apply q1800    Assessment: 72 yo male with PNA and on coumadin PTA for afib (also hx factor V liden history of DVT).  Pharmacy consulted to dose coumadin. He is also on lovenox (prophylactic dose).  INR remains stable from 1.43 to 1.41. Warfarin was restarted on 7/12. Hgb 13.6, plt 118. No s/sx of bleeding documented. On concurrent enoxaparin 60 mg daily until INR therapeutic along with azithromycin, which can impact warfarin sensitivity.   Home coumadin dose: 5mg /day (last clinic visit 10/30/17; INR was 2.2 with goal INR 2-3)  Goal of Therapy:  INR 2-3 Monitor platelets by anticoagulation protocol: Yes   Plan:  -Coumadin 6 mg po today -Daily PT/INR, s/sx of bleeding   Doylene Canard, PharmD Clinical Pharmacist  Pager: [phone] Phone: [phone] Please check Amion  for pharmacy contact number

## 2017-12-30 ENCOUNTER — Ambulatory Visit: Payer: PPO | Admitting: Emergency Medicine

## 2017-12-30 DIAGNOSIS — I482 Chronic atrial fibrillation: Secondary | ICD-10-CM | POA: Diagnosis not present

## 2017-12-30 DIAGNOSIS — Z7901 Long term (current) use of anticoagulants: Secondary | ICD-10-CM | POA: Diagnosis not present

## 2017-12-30 DIAGNOSIS — Z8619 Personal history of other infectious and parasitic diseases: Secondary | ICD-10-CM | POA: Diagnosis not present

## 2017-12-30 DIAGNOSIS — R2689 Other abnormalities of gait and mobility: Secondary | ICD-10-CM | POA: Diagnosis not present

## 2018-01-06 ENCOUNTER — Ambulatory Visit: Payer: PPO | Admitting: Pharmacist

## 2018-01-06 DIAGNOSIS — Z5181 Encounter for therapeutic drug level monitoring: Secondary | ICD-10-CM

## 2018-01-06 DIAGNOSIS — I481 Persistent atrial fibrillation: Secondary | ICD-10-CM | POA: Diagnosis not present

## 2018-01-06 DIAGNOSIS — I4819 Other persistent atrial fibrillation: Secondary | ICD-10-CM

## 2018-01-06 LAB — POCT INR: INR: 2 (ref 2.0–3.0)

## 2018-01-06 NOTE — Patient Instructions (Signed)
Description   Continue same dosage of 1 tablet every day. Recheck INR in 6 weeks. Call with any questions if gets any new medications or scheduled for any procedures  [phone]    

## 2018-01-08 ENCOUNTER — Ambulatory Visit: Payer: PPO | Attending: Physician Assistant | Admitting: Rehabilitative and Restorative Service Providers"

## 2018-01-08 DIAGNOSIS — R2689 Other abnormalities of gait and mobility: Secondary | ICD-10-CM | POA: Insufficient documentation

## 2018-01-08 DIAGNOSIS — R2681 Unsteadiness on feet: Secondary | ICD-10-CM

## 2018-01-08 NOTE — Therapy (Signed)
[facility] [address] [place], [place], [address] Phone: [phone]   Fax:  [phone]  Physical Therapy Evaluation  Patient Details  Name: Stanley Wright MRN: [id number] Date of Birth: 1945/12/19 Referring Provider: Maude Leriche, PA   Encounter Date: 01/08/2018  PT End of Session - 01/08/18 1100    Visit Number  1    Number of Visits  17    Date for PT Re-Evaluation  04/08/18    PT Start Time  1025    PT Stop Time  1104    PT Time Calculation (min)  39 min       Past Medical History:  Diagnosis Date  . Arthritis   . BENIGN PROSTATIC HYPERTROPHY, WITH OBSTRUCTION   . Cancer (HCC)    skin, basal, squamous  . COLONIC POLYPS   . Diverticulitis   . DVT (deep venous thrombosis) ([place]) 2000  . Dysrhythmia    Hx Afib- 2017  . Factor 5 Leiden mutation, heterozygous ([place])   . GERD (gastroesophageal reflux disease)    not on medication  . HIATAL HERNIA   . ITP (idiopathic thrombocytopenic purpura) 2003  . Long term current use of anticoagulant   . PSORIASIS   . Pulmonary embolus ([place]) 2000  . Shortness of breath dyspnea    at times - Talking alot  . Sleep apnea     Past Surgical History:  Procedure Laterality Date  . CARDIOVERSION N/A 11/22/2015   Procedure: CARDIOVERSION;  Surgeon: Sanda Klein, MD;  [facility];  Service: Cardiovascular;  Laterality: N/A;  . COLONOSCOPY    . HERNIA REPAIR Left    Inguinal- x2 . mesh x1  . INSERTION OF MESH N/A 02/25/2016   Procedure: INSERTION OF MESH;  Surgeon: Rolm Bookbinder, MD;  Location: [facility];  Service: General;  Laterality: N/A;  . LUNG REMOVAL, PARTIAL Right 2004  . PROSTATE SURGERY    . UMBILICAL HERNIA REPAIR N/A 02/25/2016   Procedure: LAPAROSCOPIC UMBILICAL HERNIA REPAIR;  Surgeon: Rolm Bookbinder, MD;  Location: [facility];  Service: General;  Laterality: N/A;    There were no vitals filed for this visit.   Subjective Assessment -  01/08/18 1025    Subjective  The patient reports "I've been going downhill rapidly" for the past 2 years.  He made a list of issues that hinder him:  Not able to talk loudly anymore, Walking/turns/balance (has had one fall and "lots of close ones"), "vision has gone down another click", he notices drooling.  He reports difficulty sleeping x years "I just can't sleep, I wake up." He has a sleep apnea machine that he uses some.      Pertinent History  sleep apnea, a-fib, factor V deficiency    Patient Stated Goals  "so I don't have the fear of falling everytime"; balance.    Currently in Pain?  No/denies Some h/o hip issues         [facility] PT Assessment - 01/08/18 1031      Assessment   Medical Diagnosis  Shuffling gait, falls eval    Referring Provider  Maude Leriche, PA    Onset Date/Surgical Date  01/06/18    Hand Dominance  Right    Prior Therapy  in acute care for recent admission      Precautions   Precautions  Fall      Restrictions   Weight Bearing Restrictions  No      Balance Screen   Has the  patient fallen in the past 6 months  Yes    How many times?  1    Has the patient had a decrease in activity level because of a fear of falling?   Yes    Is the patient reluctant to leave their home because of a fear of falling?   No      Home Film/video editor residence    Living Arrangements  Alone daughter + 6 kids lives near    Type of [place] to enter    Entrance Stairs-Number of Steps  2    [place]  -- planning to put one up    [place]  One level      Prior Function   Level of Independence  Independent    Leisure  mowing yard, caring for [employer]   Overall Cognitive Status  Within Functional Limits for tasks assessed      Sensation   Light Touch  Appears Intact denies numbness or tingling      Coordination   Gross Motor Movements are Fluid and Coordinated  -- Has full oculomotor ROM  with smooth pursuit    Finger Nose Finger Test  Symmetrical bilaterally, slowed pace on rapid alternating movement, finger opposition WNLs      Posture/Postural Control   Posture/Postural Control  Postural limitations    Posture Comments  Neck flexion with mild left rotation as holding position at neck with some R head tilt.      ROM / Strength   AROM / PROM / Strength  AROM;Strength      AROM   Overall AROM   Within functional limits for tasks performed      Strength   Strength Assessment Site  Shoulder;Elbow;Hip;Knee;Ankle    Right/Left Shoulder  Right;Left    Right Shoulder Flexion  5/5    Left Shoulder Flexion  5/5    Right/Left Elbow  Right;Left    Right Elbow Flexion  5/5    Left Elbow Flexion  5/5    Right/Left Hip  Right;Left    Right Hip Flexion  5/5    Left Hip Flexion  5/5    Right/Left Knee  Right;Left    Right Knee Flexion  5/5    Right Knee Extension  5/5    Left Knee Flexion  5/5    Left Knee Extension  5/5    Right/Left Ankle  Right;Left    Right Ankle Dorsiflexion  5/5    Left Ankle Dorsiflexion  5/5      Palpation   Palpation comment  Hips get sore at times; has a hospital bed at home to help with breathing (difficulty with speech and breathing 2 years ago).      Special Tests   Other special tests  Push Test= posteriorly takes 3 steps to recover when force removed.      Bed Mobility   Bed Mobility  Rolling Right;Rolling Left;Supine to Sit;Sit to Supine    Rolling Right  Independent    Rolling Left  Independent    Supine to Sit  Independent    Sit to Supine  Independent      Transfers   Transfers  Sit to Stand;Stand to Sit    Sit to Stand  6: Modified independent (Device/Increase time)    Five time sit to stand comments   19.84 seconds using legs against mat  table    Stand to Sit  6: Modified independent (Device/Increase time)      Ambulation/Gait   Ambulation/Gait  Yes    Ambulation/Gait Assistance  6: Modified independent (Device/Increase  time) slowed pace during transitions    Ambulation Distance (Feet)  200 Feet    Assistive device  None    Gait Pattern  -- IR of hips, dec'd speed on R LE during gait    Ambulation Surface  Level;Indoor    Gait velocity  3.11 ft/sec R foot IR *patient feels this is due to hip soreness    Stairs  Yes    Stairs Assistance  6: Modified independent (Device/Increase time)    Stair Management Technique  Two rails;Alternating pattern      Standardized Balance Assessment   Standardized Balance Assessment  Berg Balance Test;Timed Up and Go Test      Berg Balance Test   Sit to Stand  Able to stand  independently using hands    Standing Unsupported  Able to stand safely 2 minutes    Sitting with Back Unsupported but Feet Supported on Floor or Stool  Able to sit safely and securely 2 minutes    Stand to Sit  Sits safely with minimal use of hands    Transfers  Able to transfer safely, definite need of hands    Standing Unsupported with Eyes Closed  Able to stand 10 seconds safely    Standing Ubsupported with Feet Together  Able to place feet together independently and stand 1 minute safely    From Standing, Reach Forward with Outstretched Arm  Can reach confidently >25 cm (10")    From Standing Position, Pick up Object from Floor  Able to pick up shoe safely and easily    From Standing Position, Turn to Look Behind Over each Shoulder  Looks behind from both sides and weight shifts well    Turn 360 Degrees  Able to turn 360 degrees safely but slowly requires 18 steps each direction    Standing Unsupported, Alternately Place Feet on Step/Stool  Able to complete >2 steps/needs minimal assist    Standing Unsupported, One Foot in Front  Able to take small step independently and hold 30 seconds    Standing on One Leg  Tries to lift leg/unable to hold 3 seconds but remains standing independently    Total Score  44    Berg comment:  44/56      Timed Up and Go Test   TUG  -- 12.28 seconds                 Objective measurements completed on examination: See above findings.                PT Short Term Goals - 01/08/18 1249      PT SHORT TERM GOAL #1   Title  The patient will return demo HEP for large amplitude movements, transitional movements for HEP.  (STG target date 02/07/2018)    Time  4    Period  Weeks    Target Date  02/07/18      PT SHORT TERM GOAL #2   Title  The patient will improve Berg balance score from 44/56 to > or equal to 48/56 to demo dec'ing risk for falls.    Time  4    Period  Weeks    Target Date  02/07/18      PT SHORT TERM GOAL #3   Title  The patient will perform 360 degree turn with < or equal to 12 steps (baseline is 18 steps).    Time  4    Period  Weeks    Target Date  02/07/18      PT SHORT TERM GOAL #4   Title  The patient will be further assessed on FGA and goal to follow.    Time  4    Period  Weeks    Target Date  02/07/18        PT Long Term Goals - 01/08/18 1308      PT LONG TERM GOAL #1   Title  The patient will be indep with progression of HEP.  (LTG target date 03/09/2018)    Time  8    Period  Weeks    Target Date  03/09/18      PT LONG TERM GOAL #2   Title  The patient will improve 5 time sit<>stand to < or equal to 15 seconds (baseline 19.84 seconds).    Time  8    Period  Weeks    Target Date  03/09/18      PT LONG TERM GOAL #3   Title  The patient will be further assessed on FGA and LTG to follow.    Time  8    Period  Weeks    Target Date  03/09/18      PT LONG TERM GOAL #4   Title  The patient will perform 360 degree turn with < or equal to 8 steps (baseline is 18 steps).    Time  8    Period  Weeks    Target Date  03/09/18             Plan - 01/08/18 1321    Clinical Impression Statement  The patient is a 72 year old male presenting to OP physical therapy with 2 year progression of symptoms beginning with difficulty projecting speech, shortness of breath  and more  recently impacting walking and turns.  He notes a fear of falling and having to be deliberate with movements.  The patient demonstrates impairments in ability to perform 360 degree turns, fall risk per Merrilee Jansky, slowed performance of 5 times sit<>stand, postural changes notable in neck, and timing issues noted during gait.    History and Personal Factors relevant to plan of care:  sleep apnea, a-fib, factor V    Clinical Presentation  Evolving    Clinical Presentation due to:  progressive nature of symptoms per patient report, unsteadiness worsening    Clinical Decision Making  Moderate    Rehab Potential  Good    PT Frequency  2x / week eval +    PT Duration  8 weeks    PT Treatment/Interventions  ADLs/Self Care Home Management;Therapeutic exercise;Balance training;Neuromuscular re-education;Gait training;Therapeutic activities;Patient/family education;Stair training;DME Instruction;Manual techniques    PT Next Visit Plan  Large amplitude movements, postural strengthening (seated PWR exercises?), high level balance, work on 180/360 degree turns, complete FGA, establish HEP    Consulted and Agree with Plan of Care  Patient       Patient will benefit from skilled therapeutic intervention in order to improve the following deficits and impairments:  Abnormal gait, Decreased endurance, Decreased activity tolerance, Decreased balance, Postural dysfunction  Visit Diagnosis: Other abnormalities of gait and mobility  Unsteadiness on feet     Problem List Patient Active Problem List   Diagnosis Date Noted  . Community acquired pneumonia of left lower  lobe of lung ([place]) 12/23/2017  . Sepsis ([place]) 12/23/2017  . Morbid obesity due to excess calories ([place]) 08/23/2016  . OSA on CPAP 08/23/2016  . Dyspnea on exertion 08/22/2016  . Umbilical hernia 36/68/1594  . Persistent atrial fibrillation ([place])   . Encounter for therapeutic drug monitoring 08/12/2013  . Long term current use of anticoagulant  07/30/2010  . COLONIC POLYPS 06/03/2010  . 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 GERD 06/03/2010  . HIATAL HERNIA 06/03/2010  . BENIGN PROSTATIC HYPERTROPHY, WITH OBSTRUCTION 06/03/2010  . PSORIASIS 06/03/2010    Marinell Igarashi, PT 01/08/2018, 1:28 PM  [facility] [address] [place], [place], [address] Phone: [phone]   Fax:  [phone]  Name: Stanley Wright MRN: [id number] Date of Birth: 1945/06/28

## 2018-01-15 ENCOUNTER — Telehealth: Payer: Self-pay

## 2018-01-15 NOTE — Telephone Encounter (Signed)
Tried to reach pt re: surgical clearance and the need to make an appt. Pt's voicemail hasn't been set up and could not leave a message. Will leave in preop call back pool until someone contacts pt.  Called  [facility], spoke with Ginger and she has been made aware that pt's surgery will need to be postponed until we can get in touch with the pt to get him an appt. Ginger will relay the message as well, if she gets the pt on the phone.

## 2018-01-15 NOTE — Telephone Encounter (Signed)
   Primary Cardiologist:Brian Stanford Breed, MD  Chart reviewed as part of pre-operative protocol coverage. Because of Orbie L Prime's past medical history and time since last visit, he/she will require a follow-up visit in order to better assess preoperative cardiovascular risk. Patient has history of permanent atrial fib, factor V Leiden deficiency, prior DVT/PE, OSA, LV dysfunction, recently admitted for community acquired pneumonia, bacteremia, abnormal liver function panel, possible tropical disease, and atrial fibrillation with rapid ventricular response. 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 Therefore, likely needs Lovenox bridging. Given that we received request today for surgery on 8/6, this does not give [place] enough time to coordinate this. Furthermore, given recent complicated hospitalization, he will require an appointment before we can provide clearance.  Pre-op covering staff: - Please schedule appointment and call patient to inform them. - Please contact requesting surgeon's office via preferred method (i.e, phone, fax) to inform them of need for appointment prior to surgery.  Charlie Pitter, PA-C  01/15/2018, 1:58 PM

## 2018-01-15 NOTE — Telephone Encounter (Signed)
   [facility] Pre-operative Risk Assessment    Request for surgical clearance:  1. What type of surgery is being performed? 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 When is this surgery scheduled? 01/19/18   3. What type of clearance is required (medical clearance vs. Pharmacy clearance to hold med vs. Both)? Both  4. Are there any medications that need to be held prior to surgery and how long? Coumadin   5. Practice name and name of physician performing surgery? [facility]   6. What is your office phone number- [phone]    7.   What is your office fax number [phone]  8.   Anesthesia type (None, local, MAC, general) ?  MAC   Ena Dawley 01/15/2018, 1:18 PM  _________________________________________________________________   (provider comments below)

## 2018-01-18 NOTE — Telephone Encounter (Signed)
Lmtcb for pt to schedule an appt.

## 2018-01-19 NOTE — Telephone Encounter (Signed)
Left message today to call back to schedule appt for surgery clearance. Message has been x 2 by our office in attempt to schedule appt.

## 2018-01-21 ENCOUNTER — Ambulatory Visit: Payer: PPO | Attending: Physician Assistant | Admitting: Rehabilitative and Restorative Service Providers"

## 2018-01-21 ENCOUNTER — Encounter: Payer: Self-pay | Admitting: Rehabilitative and Restorative Service Providers"

## 2018-01-21 DIAGNOSIS — R2689 Other abnormalities of gait and mobility: Secondary | ICD-10-CM | POA: Insufficient documentation

## 2018-01-21 DIAGNOSIS — M9903 Segmental and somatic dysfunction of lumbar region: Secondary | ICD-10-CM | POA: Diagnosis not present

## 2018-01-21 DIAGNOSIS — R2681 Unsteadiness on feet: Secondary | ICD-10-CM | POA: Diagnosis not present

## 2018-01-21 DIAGNOSIS — M47814 Spondylosis without myelopathy or radiculopathy, thoracic region: Secondary | ICD-10-CM | POA: Diagnosis not present

## 2018-01-21 DIAGNOSIS — M546 Pain in thoracic spine: Secondary | ICD-10-CM | POA: Diagnosis not present

## 2018-01-21 DIAGNOSIS — R471 Dysarthria and anarthria: Secondary | ICD-10-CM | POA: Insufficient documentation

## 2018-01-21 DIAGNOSIS — M9902 Segmental and somatic dysfunction of thoracic region: Secondary | ICD-10-CM | POA: Diagnosis not present

## 2018-01-21 NOTE — Telephone Encounter (Signed)
Attempted to reach pt but call went straight to voicemail .

## 2018-01-21 NOTE — Patient Instructions (Signed)
Access Code: [email or web address]  URL: [email or web address]  Date: 01/21/2018  Prepared by: Rudell Cobb   Exercises Clamshell - 10-15 reps - 1 sets - 3 hold - 1-2x daily - 7x weekly Supine Chin Tuck - 10 reps - 3 sets - 1x daily - 7x weekly Seated Piriformis Stretch - 3 reps - 1 sets - 30 hold - 1-2x daily - 7x weekly Sit to Stand - 3-5 reps - 1 sets - 2x daily - 7x weekly Side Lunge with Rotation - 10 reps - 1 sets - 1x daily - 7x weekly

## 2018-01-21 NOTE — Therapy (Addendum)
[facility] [address] [place], [place], [address] Phone: [phone]   Fax:  [phone]  Physical Therapy Treatment  Patient Details  Name: Stanley Wright MRN: [id number] Date of Birth: [birth year] Referring Provider: Maude Leriche, PA   Encounter Date: 01/21/2018  PT End of Session - 01/21/18 1148    Visit Number  2    Number of Visits  17    Date for PT Re-Evaluation  04/08/18    PT Start Time  0848    PT Stop Time  0932    PT Time Calculation (min)  44 min    Equipment Utilized During Treatment  Gait belt    Activity Tolerance  Patient tolerated treatment well    Behavior During Therapy  [facility] for tasks assessed/performed       Past Medical History:  Diagnosis Date  . Arthritis   . BENIGN PROSTATIC HYPERTROPHY, WITH OBSTRUCTION   . Cancer (HCC)    skin, basal, squamous  . COLONIC POLYPS   . Diverticulitis   . DVT (deep venous thrombosis) ([place]) 2000  . Dysrhythmia    Hx Afib- 2017  . Factor 5 Leiden mutation, heterozygous ([place])   . GERD (gastroesophageal reflux disease)    not on medication  . HIATAL HERNIA   . ITP (idiopathic thrombocytopenic purpura) 2003  . Long term current use of anticoagulant   . PSORIASIS   . Pulmonary embolus ([place]) 2000  . Shortness of breath dyspnea    at times - Talking alot  . Sleep apnea     Past Surgical History:  Procedure Laterality Date  . CARDIOVERSION N/A 11/22/2015   Procedure: CARDIOVERSION;  Surgeon: Sanda Klein, MD;  [facility];  Service: Cardiovascular;  Laterality: N/A;  . COLONOSCOPY    . HERNIA REPAIR Left    Inguinal- x2 . mesh x1  . INSERTION OF MESH N/A 02/25/2016   Procedure: INSERTION OF MESH;  Surgeon: Rolm Bookbinder, MD;  Location: [facility];  Service: General;  Laterality: N/A;  . LUNG REMOVAL, PARTIAL Right 2004  . PROSTATE SURGERY    . UMBILICAL HERNIA REPAIR N/A 02/25/2016   Procedure: LAPAROSCOPIC UMBILICAL HERNIA REPAIR;   Surgeon: Rolm Bookbinder, MD;  Location: [facility];  Service: General;  Laterality: N/A;    There were no vitals filed for this visit.  Subjective Assessment - 01/21/18 0851    Subjective  The patient reports he thinks he is doing a little better feeling like his confidence has improved since his evaluation here.  He notes continued left hip pain that is aggravated by laying on it or walking.    Pertinent History  sleep apnea, a-fib, factor V deficiency    Patient Stated Goals  "so I don't have the fear of falling everytime"; balance.    Currently in Pain?  Yes    Pain Score  5     Pain Location  Hip    Pain Orientation  Left    Pain Descriptors / Indicators  Aching;Tender    Pain Type  Chronic pain    Pain Onset  More than a month ago    Pain Frequency  Intermittent    Aggravating Factors   when first transitioning to walking, laying on the left side    Pain Relieving Factors  resting                       [facility] Adult PT Treatment/Exercise - 01/21/18 8127  Ambulation/Gait   Ambulation/Gait  Yes    Ambulation/Gait Assistance  6: Modified independent (Device/Increase time)    Ambulation Distance (Feet)  200 Feet    Assistive device  None    Ambulation Surface  Level;Indoor    Gait Comments  Patient encouraged upright posture with verbal cues.        Neuro Re-ed    Neuro Re-ed Details   Standing activities emphasizing large amplitude movements stepping laterally with hip rotation (lateral lunge) and return to midline dec'ing UE support with CGA. Standing lateral weight shifting for wide marches, however this irritates L hip.    Anterior lunges alternating R and L sides working on push off to return to midline.      Exercises   Exercises  Other Exercises    Other Exercises   SIDELYING:  right side, performing left hip extension with passive overpressure for hip flexor stretch, left IT band stretch with tenderness to palpation noted, attempted hip abduction with L LE  and patient has compensatory rolling and hip flexion.  Therefore, modified to clam shell L x 10 reps. Left sidelying, right hip abduction x 10 reps and clamshells x 10 reps.  SUPINE:; pitiformis stretch with assist for positioning.   Chin tuck with manual techniques on proper motion. SEATED:  piriformis stretch. Seated upper back retraction moving shoulders into horizontal abduction.  STANDING:  sit<.stand (patient reports the rectangle on the bottom of his back gets painful with this activity), PT worked on technique scooting forward and maintaining upright chest while initiating movement from hips.  Added low reps to HEP (3-5 to begin).             PT Education - 01/21/18 1148    Education Details  HEP: clamshells, sit<>stand, chin tucks, piriformis stretch and side lunge    Person(s) Educated  Patient    Methods  Explanation;Demonstration;Handout    Comprehension  Returned demonstration;Verbalized understanding            PT Short Term Goals - 01/21/18 1247      PT SHORT TERM GOAL #1   Title  The patient will return demo HEP for large amplitude movements, transitional movements for HEP.  (STG target date 02/07/2018)    Time  4    Period  Weeks      PT SHORT TERM GOAL #2   Title  The patient will improve Berg balance score from 44/56 to > or equal to 48/56 to demo dec'ing risk for falls.    Time  4    Period  Weeks      PT SHORT TERM GOAL #3   Title  The patient will perform 360 degree turn with < or equal to 12 steps (baseline is 18 steps).    Time  4    Period  Weeks      PT SHORT TERM GOAL #4   Title  The patient will be further assessed on FGA and goal to follow.    Time  4    Period  Weeks      PT Long Term Goals - 01/21/18 1247      PT LONG TERM GOAL #1   Title  The patient will be indep with progression of HEP.  (LTG target date 03/09/2018)    Time  8    Period  Weeks      PT LONG TERM GOAL #2   Title  The patient will improve 5 time sit<>stand to < or  equal to 15 seconds (baseline 19.84 seconds).    Time  8    Period  Weeks      PT LONG TERM GOAL #3   Title  The patient will be further assessed on FGA and LTG to follow.    Time  8    Period  Weeks      PT LONG TERM GOAL #4   Title  The patient will perform 360 degree turn with < or equal to 8 steps (baseline is 18 steps).    Time  8    Period  Weeks      *addended due to goals not pulling forward after epic upgrade.       Plan - 01/21/18 1155    Clinical Impression Statement  The patient reports L hip discomfort is limiting walking and lying on the left side.  PT worked on L hip strengthening/stretching for home and also added exercise for neck positioning.  Patient tolerated activities well in therapy and PT to continue to focus on larger amplitude movements, posture, LE strengthening, and general mobility.     PT Treatment/Interventions  ADLs/Self Care Home Management;Therapeutic exercise;Balance training;Neuromuscular re-education;Gait training;Therapeutic activities;Patient/family education;Stair training;DME Instruction;Manual techniques    PT Next Visit Plan  Large amplitude movements, postural strengthening, high level balance, work on 180/360 degree turns, complete FGA, check HEP if patient has questions.    Consulted and Agree with Plan of Care  Patient       Patient will benefit from skilled therapeutic intervention in order to improve the following deficits and impairments:  Abnormal gait, Decreased endurance, Decreased activity tolerance, Decreased balance, Postural dysfunction  Visit Diagnosis: Other abnormalities of gait and mobility     Problem List Patient Active Problem List   Diagnosis Date Noted  . Community acquired pneumonia of left lower lobe of lung ([place]) 12/23/2017  . Sepsis ([place]) 12/23/2017  . Morbid obesity due to excess calories ([place]) 08/23/2016  . OSA on CPAP 08/23/2016  . Dyspnea on exertion 08/22/2016  . Umbilical hernia 27/51/7001  .  Persistent atrial fibrillation ([place])   . Encounter for therapeutic drug monitoring 08/12/2013  . Long term current use of anticoagulant 07/30/2010  . COLONIC POLYPS 06/03/2010  . 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 GERD 06/03/2010  . HIATAL HERNIA 06/03/2010  . BENIGN PROSTATIC HYPERTROPHY, WITH OBSTRUCTION 06/03/2010  . PSORIASIS 06/03/2010    Salam Micucci, PT 01/21/2018, 11:59 AM  [facility] [address] [place], [place], [address] Phone: [phone]   Fax:  [phone]  Name: JALEEN FINCH MRN: [id number] Date of Birth: 12/15/1945

## 2018-01-25 DIAGNOSIS — M546 Pain in thoracic spine: Secondary | ICD-10-CM | POA: Diagnosis not present

## 2018-01-25 DIAGNOSIS — M9903 Segmental and somatic dysfunction of lumbar region: Secondary | ICD-10-CM | POA: Diagnosis not present

## 2018-01-25 DIAGNOSIS — M47814 Spondylosis without myelopathy or radiculopathy, thoracic region: Secondary | ICD-10-CM | POA: Diagnosis not present

## 2018-01-25 DIAGNOSIS — M9902 Segmental and somatic dysfunction of thoracic region: Secondary | ICD-10-CM | POA: Diagnosis not present

## 2018-01-26 ENCOUNTER — Encounter: Payer: Self-pay | Admitting: Physical Therapy

## 2018-01-26 ENCOUNTER — Ambulatory Visit: Payer: PPO | Admitting: Physical Therapy

## 2018-01-26 DIAGNOSIS — R2689 Other abnormalities of gait and mobility: Secondary | ICD-10-CM | POA: Diagnosis not present

## 2018-01-26 DIAGNOSIS — R2681 Unsteadiness on feet: Secondary | ICD-10-CM

## 2018-01-26 NOTE — Telephone Encounter (Signed)
I got through to Stanley Wright. He has an appointment 9/25 with Dr Stanford Breed.  Kerin Ransom PA-C 01/26/2018 2:43 PM

## 2018-01-26 NOTE — Patient Instructions (Addendum)
Access Code: [email or web address]  URL: [email or web address]  Date: 01/27/2018  Prepared by: Barry Brunner   Exercises  Seated Piriformis Stretch - 3 reps - 1 sets - 30 hold - 1-2x daily - 7x weekly  Cervical retraction and scapular adduction at doorframe - 10 reps - 1 sets - 5 hold - 1x daily - 7x weekly   UPDATED Sit to Stand - 3-5 reps - 1 sets - 2x daily - 7x weekly  Standing Single Leg Stance with Unilateral Counter Support - 3 reps - 1 sets - 10-30 seconds hold - 1x daily - 7x weekly   ADDED Supine Hamstring Stretch with Strap - 3 reps - 1 sets - 30 seconds hold - 1-2x daily - 7x weekly  ADDED Seated Hamstring Stretch - 3 reps - 1 sets - 30 seconds hold - 1-2x daily - 7x weekly   ADDED (but forgot to print for patient)

## 2018-01-27 NOTE — Therapy (Signed)
[facility] [address] [place], [place], [address] Phone: [phone]   Fax:  [phone]  Physical Therapy Treatment  Patient Details  Name: Stanley Wright MRN: [id number] Date of Birth: 22-Jan-1946 Referring Provider: Maude Leriche, PA   Encounter Date: 01/26/2018  PT End of Session - 01/26/18 1811    Visit Number  3    Number of Visits  17    Date for PT Re-Evaluation  04/08/18    PT Start Time  1532    PT Stop Time  1620    PT Time Calculation (min)  48 min    Equipment Utilized During Treatment  --    Activity Tolerance  Patient tolerated treatment well   denied incr pain at end of session   Behavior During Therapy  [facility] for tasks assessed/performed       Past Medical History:  Diagnosis Date  . Arthritis   . BENIGN PROSTATIC HYPERTROPHY, WITH OBSTRUCTION   . Cancer (HCC)    skin, basal, squamous  . COLONIC POLYPS   . Diverticulitis   . DVT (deep venous thrombosis) ([place]) 2000  . Dysrhythmia    Hx Afib- 2017  . Factor 5 Leiden mutation, heterozygous ([place])   . GERD (gastroesophageal reflux disease)    not on medication  . HIATAL HERNIA   . ITP (idiopathic thrombocytopenic purpura) 2003  . Long term current use of anticoagulant   . PSORIASIS   . Pulmonary embolus ([place]) 2000  . Shortness of breath dyspnea    at times - Talking alot  . Sleep apnea     Past Surgical History:  Procedure Laterality Date  . CARDIOVERSION N/A 11/22/2015   Procedure: CARDIOVERSION;  Surgeon: Sanda Klein, MD;  [facility];  Service: Cardiovascular;  Laterality: N/A;  . COLONOSCOPY    . HERNIA REPAIR Left    Inguinal- x2 . mesh x1  . INSERTION OF MESH N/A 02/25/2016   Procedure: INSERTION OF MESH;  Surgeon: Rolm Bookbinder, MD;  Location: [facility];  Service: General;  Laterality: N/A;  . LUNG REMOVAL, PARTIAL Right 2004  . PROSTATE SURGERY    . UMBILICAL HERNIA REPAIR N/A 02/25/2016   Procedure:  LAPAROSCOPIC UMBILICAL HERNIA REPAIR;  Surgeon: Rolm Bookbinder, MD;  Location: [facility];  Service: General;  Laterality: N/A;    There were no vitals filed for this visit.  Subjective Assessment - 01/26/18 1534    Subjective  Didn't do his exercises after it began to cause left thigh pain and felt like there was no sense doing them if they caused pain. Has an appointment to see Dr. Jannifer Franklin    Pertinent History  sleep apnea, a-fib, factor V deficiency    Patient Stated Goals  "so I don't have the fear of falling everytime"; balance.    Currently in Pain?  No/denies   sitting at rest or when walking   Pain Onset  --                       [facility] Adult PT Treatment/Exercise - 01/26/18 1753      Ambulation/Gait   Ambulation/Gait Assistance  6: Modified independent (Device/Increase time)    Ambulation Distance (Feet)  240 Feet    Assistive device  None    Gait Pattern  Step-through pattern;Decreased arm swing - left;Decreased dorsiflexion - left;Left foot flat;Decreased trunk rotation;Wide base of support;Poor foot clearance - left    Ambulation Surface  Indoor  Gait Comments  vc for increasing LUE swing with minimal results; Lt foot noted to "scuff" the floor at metatarsal heads with then landing foot flat; he denies "catching" his toe or pitching forwards; vc for emphasizing heel strike and pt able to improve rt heelstrike however very limited left heelstrike; assessed DF strength 4+ left, 5 right; PROM in sitting with knee extended to 5-10 degrees bilaterally      Neck Exercises: Standing   Neck Retraction  5 reps;5 secs x 2 sets   Neck Retraction Limitations  at wall and then at doorframe adding scapular squeeze      Knee/Hip Exercises: Stretches   Active Hamstring Stretch  Both;3 reps;30 seconds; 3 sets   Active Hamstring Stretch Limitations  supine holding behind knee vs supine with strap around foot vs sitting at edge of chair, knee extended and hip flexion forward           Balance Exercises - 01/26/18 1750      Balance Exercises: Standing   SLS  Eyes open;Intermittent upper extremity support;3 reps;10 secs;20 secs   <10 sec on RLE; >10 sec on LLE   Turning  Right;Left;10 reps   90 degrees attempting to lead with foot on side of turn       PT Education - 01/26/18 1809    Education Details  discussed rationale for clamshell exercise and side lunge, however pt reports too painful during and after exercises (lateral left thigh pain due to lumbar issues); updated HEP to SLS for hip strength and balance (plus additional updates)    Person(s) Educated  Patient    Methods  Explanation;Demonstration;Verbal cues;Handout    Comprehension  Verbalized understanding;Returned demonstration;Verbal cues required;Need further instruction       PT Short Term Goals - 01/21/18 1247      PT SHORT TERM GOAL #1   Title  The patient will return demo HEP for large amplitude movements, transitional movements for HEP.  (STG target date 02/07/2018)    Time  4    Period  Weeks      PT SHORT TERM GOAL #2   Title  The patient will improve Berg balance score from 44/56 to > or equal to 48/56 to demo dec'ing risk for falls.    Time  4    Period  Weeks      PT SHORT TERM GOAL #3   Title  The patient will perform 360 degree turn with < or equal to 12 steps (baseline is 18 steps).    Time  4    Period  Weeks      PT SHORT TERM GOAL #4   Title  The patient will be further assessed on FGA and goal to follow.    Time  4    Period  Weeks        PT Long Term Goals - 01/21/18 1247      PT LONG TERM GOAL #1   Title  The patient will be indep with progression of HEP.  (LTG target date 03/09/2018)    Time  8    Period  Weeks      PT LONG TERM GOAL #2   Title  The patient will improve 5 time sit<>stand to < or equal to 15 seconds (baseline 19.84 seconds).    Time  8    Period  Weeks      PT LONG TERM GOAL #3   Title  The patient will be further assessed on FGA  and  LTG to follow.    Time  8    Period  Weeks      PT LONG TERM GOAL #4   Title  The patient will perform 360 degree turn with < or equal to 8 steps (baseline is 18 steps).    Time  8    Period  Weeks            Plan - 01/26/18 1815    Clinical Impression Statement  Patient reported completing recent HEP for 2 days with increased pain and inability to lie on either side disrupting his sleep. Focused session on revising HEP to continue to focus on stretching hamstrings, cervical and thoracic spine; strengthening LEs and balance. Gait training to improve bil foot clearance to reduce fall risk with limited results with LLE.     PT Treatment/Interventions  ADLs/Self Care Home Management;Therapeutic exercise;Balance training;Neuromuscular re-education;Gait training;Therapeutic activities;Patient/family education;Stair training;DME Instruction;Manual techniques    PT Next Visit Plan  (8/13 instructed pt that seated hamstring stretch is less preferrable and supine is better for him, and then forgot to print the seated version for him. If he wants to have seated version as an option, please print from his [place]: [id number]) check HEP if patient has questions;  work on 180/360 degree turns; Large amplitude movements, postural strengthening, high level balance,, complete FGA, .    Consulted and Agree with Plan of Care  Patient       Patient will benefit from skilled therapeutic intervention in order to improve the following deficits and impairments:  Abnormal gait, Decreased endurance, Decreased activity tolerance, Decreased balance, Postural dysfunction  Visit Diagnosis: Other abnormalities of gait and mobility  Unsteadiness on feet     Problem List Patient Active Problem List   Diagnosis Date Noted  . Community acquired pneumonia of left lower lobe of lung ([place]) 12/23/2017  . Sepsis ([place]) 12/23/2017  . Morbid obesity due to excess calories ([place]) 08/23/2016  . OSA on  CPAP 08/23/2016  . Dyspnea on exertion 08/22/2016  . Umbilical hernia 66/59/9357  . Persistent atrial fibrillation ([place])   . Encounter for therapeutic drug monitoring 08/12/2013  . Long term current use of anticoagulant 07/30/2010  . COLONIC POLYPS 06/03/2010  . 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 GERD 06/03/2010  . HIATAL HERNIA 06/03/2010  . BENIGN PROSTATIC HYPERTROPHY, WITH OBSTRUCTION 06/03/2010  . PSORIASIS 06/03/2010    Stanley Wright, PT 01/27/2018, 7:44 AM  [facility] [address] [place], [place], [address] Phone: [phone]   Fax:  [phone]  Name: Stanley Wright MRN: [id number] Date of Birth: 06-May-1946

## 2018-01-28 ENCOUNTER — Encounter: Payer: Self-pay | Admitting: Physical Therapy

## 2018-01-28 ENCOUNTER — Ambulatory Visit: Payer: PPO | Admitting: Physical Therapy

## 2018-01-28 DIAGNOSIS — M9903 Segmental and somatic dysfunction of lumbar region: Secondary | ICD-10-CM | POA: Diagnosis not present

## 2018-01-28 DIAGNOSIS — R2689 Other abnormalities of gait and mobility: Secondary | ICD-10-CM

## 2018-01-28 DIAGNOSIS — M47814 Spondylosis without myelopathy or radiculopathy, thoracic region: Secondary | ICD-10-CM | POA: Diagnosis not present

## 2018-01-28 DIAGNOSIS — M546 Pain in thoracic spine: Secondary | ICD-10-CM | POA: Diagnosis not present

## 2018-01-28 DIAGNOSIS — R2681 Unsteadiness on feet: Secondary | ICD-10-CM

## 2018-01-28 DIAGNOSIS — M9902 Segmental and somatic dysfunction of thoracic region: Secondary | ICD-10-CM | POA: Diagnosis not present

## 2018-01-28 NOTE — Patient Instructions (Addendum)
Turning in Place: Solid Surface    Standing in place, lead with head and turn slowly making quarter turns toward left.  Think about a clock:  Step-together, step and turn Repeat __3__ times per session. Also, repeat the turn to the right side.  Do _2-3___ sessions per day.   Copyright  VHI. All rights reserved.    Also provided P[employer]! [employer] Handout for rocking side to side (with written instructions for lateral rocking x 5, rock and lift x 5 reps, then quarter turn-step together, step and go) to change direcitons

## 2018-01-28 NOTE — Therapy (Signed)
[facility] [address] [place], [place], [address] Phone: [phone]   Fax:  [phone]  Physical Therapy Treatment  Patient Details  Name: Stanley Wright MRN: [id number] Date of Birth: May 23, 1946 Referring Provider: Maude Leriche, PA   Encounter Date: 01/28/2018  PT End of Session - 01/28/18 1051    Visit Number  4    Number of Visits  17    Date for PT Re-Evaluation  04/08/18    PT Start Time  0938    PT Stop Time  1016    PT Time Calculation (min)  38 min    Activity Tolerance  Patient tolerated treatment well   denied incr pain at end of session   Behavior During Therapy  [facility] for tasks assessed/performed       Past Medical History:  Diagnosis Date  . Arthritis   . BENIGN PROSTATIC HYPERTROPHY, WITH OBSTRUCTION   . Cancer (HCC)    skin, basal, squamous  . COLONIC POLYPS   . Diverticulitis   . DVT (deep venous thrombosis) ([place]) 2000  . Dysrhythmia    Hx Afib- 2017  . Factor 5 Leiden mutation, heterozygous ([place])   . GERD (gastroesophageal reflux disease)    not on medication  . HIATAL HERNIA   . ITP (idiopathic thrombocytopenic purpura) 2003  . Long term current use of anticoagulant   . PSORIASIS   . Pulmonary embolus ([place]) 2000  . Shortness of breath dyspnea    at times - Talking alot  . Sleep apnea     Past Surgical History:  Procedure Laterality Date  . CARDIOVERSION N/A 11/22/2015   Procedure: CARDIOVERSION;  Surgeon: Sanda Klein, MD;  [facility];  Service: Cardiovascular;  Laterality: N/A;  . COLONOSCOPY    . HERNIA REPAIR Left    Inguinal- x2 . mesh x1  . INSERTION OF MESH N/A 02/25/2016   Procedure: INSERTION OF MESH;  Surgeon: Rolm Bookbinder, MD;  Location: [facility];  Service: General;  Laterality: N/A;  . LUNG REMOVAL, PARTIAL Right 2004  . PROSTATE SURGERY    . UMBILICAL HERNIA REPAIR N/A 02/25/2016   Procedure: LAPAROSCOPIC UMBILICAL HERNIA REPAIR;  Surgeon:  Rolm Bookbinder, MD;  Location: [facility];  Service: General;  Laterality: N/A;    There were no vitals filed for this visit.  Subjective Assessment - 01/28/18 1038    Subjective  Main problem is turning from standing position, like when I'm at the sink shaving.  I feel like these three visits have already been helpful.    Pertinent History  sleep apnea, a-fib, factor V deficiency    Patient Stated Goals  "so I don't have the fear of falling everytime"; balance.    Currently in Pain?  No/denies                       [facility] Adult PT Treatment/Exercise - 01/28/18 0001      Ambulation/Gait   Ambulation/Gait  Yes    Ambulation/Gait Assistance  6: Modified independent (Device/Increase time)    Ambulation Distance (Feet)  400 Feet   with turns:  marching turns, wide U-turns   Assistive device  None    Gait Pattern  Step-through pattern;Decreased arm swing - left;Decreased dorsiflexion - left;Left foot flat;Decreased trunk rotation;Wide base of support;Poor foot clearance - left    Ambulation Surface  Indoor    Gait Comments  Cues for heelstrike and foot clearance with gait.  Pt reports "I  don't have trouble with his walking, like you may think I do; it's just the turns."      High Level Balance   High Level Balance Activities  Turns    High Level Balance Comments  Practiced at counter, simulating finishing shaving tasks:  with wide BOS:  lateral rocking side to side x 5 reps, then rock and lift (to bend knee and lift from floor) x 5 reps, then quarter turn (step together, step and go) -turn to 3:00, then to 5:00 on R; turn to 9:00, then 7:00 on L to initiate and complete 180 degree turn, to avoid crossing feet to turn.  Practiced multiple reps of this activity, progressing to carrying cones with turning activity.  Pt needs cues to complete sequence and avoid crossing feet.      Neuro Re-ed    Neuro Re-ed Details   Standing activities to emphasize weightshifting and large  movements:  at counter:  PWR! Up x 10 reps with wide BOS and cues for upright posture, PWR! Rock for [employer] and reaching, then [employer]! Step x 5-10 reps each side;  pt needs cues to slow pace and increase step length of lateral stepping activity.   Single limb stance 4 reps 10 seconds with minimal UE support; then partial tandem/tandem stance 10 seconds each with minimal UE support.       Standing at counter:  Stagger stance forward/back weightshifting 10 reps, 2 sets, each foot position      PT Education - 01/28/18 1051    Education Details  [place] and turns added to HEP    Person(s) Educated  Patient    Methods  Explanation;Demonstration;Handout    Comprehension  Verbalized understanding;Returned demonstration;Verbal cues required;Need further instruction       PT Short Term Goals - 01/21/18 1247      PT SHORT TERM GOAL #1   Title  The patient will return demo HEP for large amplitude movements, transitional movements for HEP.  (STG target date 02/07/2018)    Time  4    Period  Weeks      PT SHORT TERM GOAL #2   Title  The patient will improve Berg balance score from 44/56 to > or equal to 48/56 to demo dec'ing risk for falls.    Time  4    Period  Weeks      PT SHORT TERM GOAL #3   Title  The patient will perform 360 degree turn with < or equal to 12 steps (baseline is 18 steps).    Time  4    Period  Weeks      PT SHORT TERM GOAL #4   Title  The patient will be further assessed on FGA and goal to follow.    Time  4    Period  Weeks        PT Long Term Goals - 01/21/18 1247      PT LONG TERM GOAL #1   Title  The patient will be indep with progression of HEP.  (LTG target date 03/09/2018)    Time  8    Period  Weeks      PT LONG TERM GOAL #2   Title  The patient will improve 5 time sit<>stand to < or equal to 15 seconds (baseline 19.84 seconds).    Time  8    Period  Weeks      PT LONG TERM GOAL #3   Title  The patient will be  further  assessed on FGA and LTG to follow.    Time  8    Period  Weeks      PT LONG TERM GOAL #4   Title  The patient will perform 360 degree turn with < or equal to 8 steps (baseline is 18 steps).    Time  8    Period  Weeks            Plan - 01/28/18 1058    Clinical Impression Statement  Treatment session focused today on wegithsfhiting and turns, specifically practicing 180 degree turns to initiate gait from standing position at counter activities.  With repetition and practice, pt is able to perform with less cues.  With weightshifting standing activities, pt needs cues for slowed pace and increased amplitude of movement.  Pt will continue to benefit from skilled physical therapy to address balance, weightshifting, turns to initiate gait.    PT Treatment/Interventions  ADLs/Self Care Home Management;Therapeutic exercise;Balance training;Neuromuscular re-education;Gait training;Therapeutic activities;Patient/family education;Stair training;DME Instruction;Manual techniques    PT Next Visit Plan  Follow-up work on 180 and 360 turns, large amplitude movements, postural strengthening, high level balance; ?complete FGA    Consulted and Agree with Plan of Care  Patient       Patient will benefit from skilled therapeutic intervention in order to improve the following deficits and impairments:  Abnormal gait, Decreased endurance, Decreased activity tolerance, Decreased balance, Postural dysfunction  Visit Diagnosis: Unsteadiness on feet  Other abnormalities of gait and mobility     Problem List Patient Active Problem List   Diagnosis Date Noted  . Community acquired pneumonia of left lower lobe of lung ([place]) 12/23/2017  . Sepsis ([place]) 12/23/2017  . Morbid obesity due to excess calories ([place]) 08/23/2016  . OSA on CPAP 08/23/2016  . Dyspnea on exertion 08/22/2016  . Umbilical hernia 65/46/5035  . Persistent atrial fibrillation ([place])   . Encounter for therapeutic drug monitoring  08/12/2013  . Long term current use of anticoagulant 07/30/2010  . COLONIC POLYPS 06/03/2010  . 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 GERD 06/03/2010  . HIATAL HERNIA 06/03/2010  . BENIGN PROSTATIC HYPERTROPHY, WITH OBSTRUCTION 06/03/2010  . PSORIASIS 06/03/2010    Jalisia Puchalski W. 01/28/2018, 1:06 PM  Frazier Butt., PT   [facility] [address] [place], [place], [address] Phone: [phone]   Fax:  [phone]  Name: CHAMAR BROUGHTON MRN: [id number] Date of Birth: 18-Sep-1945

## 2018-01-29 ENCOUNTER — Encounter

## 2018-02-01 ENCOUNTER — Ambulatory Visit: Payer: PPO | Admitting: Physical Therapy

## 2018-02-01 ENCOUNTER — Encounter: Payer: Self-pay | Admitting: Physical Therapy

## 2018-02-01 DIAGNOSIS — R2689 Other abnormalities of gait and mobility: Secondary | ICD-10-CM | POA: Diagnosis not present

## 2018-02-01 DIAGNOSIS — M47814 Spondylosis without myelopathy or radiculopathy, thoracic region: Secondary | ICD-10-CM | POA: Diagnosis not present

## 2018-02-01 DIAGNOSIS — R2681 Unsteadiness on feet: Secondary | ICD-10-CM

## 2018-02-01 DIAGNOSIS — M9903 Segmental and somatic dysfunction of lumbar region: Secondary | ICD-10-CM | POA: Diagnosis not present

## 2018-02-01 DIAGNOSIS — M546 Pain in thoracic spine: Secondary | ICD-10-CM | POA: Diagnosis not present

## 2018-02-01 DIAGNOSIS — M9902 Segmental and somatic dysfunction of thoracic region: Secondary | ICD-10-CM | POA: Diagnosis not present

## 2018-02-02 NOTE — Therapy (Signed)
[facility] [address] [place], [place], [address] Phone: [phone]   Fax:  [phone]  Physical Therapy Treatment  Patient Details  Name: Stanley Wright MRN: [id number] Date of Birth: 1945-10-01 Referring Provider: Maude Leriche, PA   Encounter Date: 02/01/2018  PT End of Session - 02/02/18 1805    Visit Number  5    Number of Visits  17    Date for PT Re-Evaluation  04/08/18    PT Start Time  1020    PT Stop Time  1104    PT Time Calculation (min)  44 min    Activity Tolerance  Patient tolerated treatment well   denied incr pain at end of session   Behavior During Therapy  [facility] for tasks assessed/performed       Past Medical History:  Diagnosis Date  . Arthritis   . BENIGN PROSTATIC HYPERTROPHY, WITH OBSTRUCTION   . Cancer (HCC)    skin, basal, squamous  . COLONIC POLYPS   . Diverticulitis   . DVT (deep venous thrombosis) ([place]) 2000  . Dysrhythmia    Hx Afib- 2017  . Factor 5 Leiden mutation, heterozygous ([place])   . GERD (gastroesophageal reflux disease)    not on medication  . HIATAL HERNIA   . ITP (idiopathic thrombocytopenic purpura) 2003  . Long term current use of anticoagulant   . PSORIASIS   . Pulmonary embolus ([place]) 2000  . Shortness of breath dyspnea    at times - Talking alot  . Sleep apnea     Past Surgical History:  Procedure Laterality Date  . CARDIOVERSION N/A 11/22/2015   Procedure: CARDIOVERSION;  Surgeon: Sanda Klein, MD;  [facility];  Service: Cardiovascular;  Laterality: N/A;  . COLONOSCOPY    . HERNIA REPAIR Left    Inguinal- x2 . mesh x1  . INSERTION OF MESH N/A 02/25/2016   Procedure: INSERTION OF MESH;  Surgeon: Rolm Bookbinder, MD;  Location: [facility];  Service: General;  Laterality: N/A;  . LUNG REMOVAL, PARTIAL Right 2004  . PROSTATE SURGERY    . UMBILICAL HERNIA REPAIR N/A 02/25/2016   Procedure: LAPAROSCOPIC UMBILICAL HERNIA REPAIR;  Surgeon:  Rolm Bookbinder, MD;  Location: [facility];  Service: General;  Laterality: N/A;    There were no vitals filed for this visit.  Subjective Assessment - 02/01/18 1021    Subjective  I've been practicing the turns and I've modified it a bit.  Have Dr. appointment on Wednesday with Dr. Jannifer Franklin and I want to know what to ask him.    Pertinent History  sleep apnea, a-fib, factor V deficiency    Patient Stated Goals  "so I don't have the fear of falling everytime"; balance.    Currently in Pain?  Yes    Pain Score  5     Pain Location  Hip    Pain Orientation  Left    Pain Descriptors / Indicators  Aching    Pain Type  Chronic pain    Pain Onset  More than a month ago    Pain Frequency  Intermittent    Aggravating Factors   when first transitioning to walking, laying on the left side    Pain Relieving Factors  resting         [facility] PT Assessment - 02/02/18 1748      Functional Gait  Assessment   Gait assessed   Yes    Gait Level Surface  Walks 20 ft,  slow speed, abnormal gait pattern, evidence for imbalance or deviates 10-15 in outside of the 12 in walkway width. Requires more than 7 sec to ambulate 20 ft.   9.38   Change in Gait Speed  Able to change speed, demonstrates mild gait deviations, deviates 6-10 in outside of the 12 in walkway width, or no gait deviations, unable to achieve a major change in velocity, or uses a change in velocity, or uses an assistive device.    Gait with Horizontal Head Turns  Performs head turns with moderate changes in gait velocity, slows down, deviates 10-15 in outside 12 in walkway width but recovers, can continue to walk.    Gait with Vertical Head Turns  Performs task with moderate change in gait velocity, slows down, deviates 10-15 in outside 12 in walkway width but recovers, can continue to walk.    Gait and Pivot Turn  Pivot turns safely within 3 sec and stops quickly with no loss of balance.    Step Over Obstacle  Is able to step over one shoe box (4.5  in total height) but must slow down and adjust steps to clear box safely. May require verbal cueing.    Gait with Narrow Base of Support  Ambulates less than 4 steps heel to toe or cannot perform without assistance.    Gait with Eyes Closed  Walks 20 ft, slow speed, abnormal gait pattern, evidence for imbalance, deviates 10-15 in outside 12 in walkway width. Requires more than 9 sec to ambulate 20 ft.    Ambulating Backwards  Walks 20 ft, slow speed, abnormal gait pattern, evidence for imbalance, deviates 10-15 in outside 12 in walkway width.   27.72   Steps  Alternating feet, must use rail.    Total Score  13    FGA comment:  Scores <22/30 indicate increased fall risk.                   [facility] Adult PT Treatment/Exercise - 02/02/18 1748      High Level Balance   High Level Balance Activities  Turns    High Level Balance Comments  Reviewed turning practice from last visit, with pt return demo understanding of rocking, rocking and lifting lower extremities, and rocking to step and initiate turn.  Cues provided for large amplitude rocking and lifting (higher foot clearance from floor).  Patient able to turn full 360 degree turn to R and L (x 2 reps each) in 6 steps, then 5 steps (compared to 18 steps at eval)      Self-Care   Self-Care  Other Self-Care Comments    Other Self-Care Comments   Answered pt's questions regarding upcoming neurologist appointment:  in regards to what he shoulde be asking neurologist.  PT reviewed his PT eval and pt has well-documented mobility (balance, falls, turns), vision, lower voice volume, sleep difficulties in PT eval (and pt keeps notes on his phone.  Encouraged patient to discuss these symptoms in detail with Dr. Jannifer Franklin, including length of time since they began, severity of symptoms, and how they are impacting daily activities.  Pt also notes today he is having difficulty with walking backwards (especially pulling or carrying things backwards) and he is  concerned about forward head posture.  Encouraged pt to mention these things to Dr. Jannifer Franklin as well.        Neuro Re-ed    Neuro Re-ed Details   Standing activities to emphasize weightshifting and large movements:  at counter:  PWR! Up x 10 reps with wide BOS and cues for upright posture, PWR! Rock for [employer] and reaching, (modified) PWR! Twist for weightshifting and reaching across midline, then PWR! Step x 5-10 reps each side;  pt needs cues to slow pace and increase step length of lateral stepping activity.   Pt does report increased pain L hip with lateral stepping          Balance Exercises - 02/02/18 1802      Balance Exercises: Standing   Stepping Strategy  Posterior;UE support;10 reps    Retro Gait  Upper extremity support;3 reps   Forward/back walking at counter, cues for posture   Sidestepping  2 reps   R and L at counter, pt c/o L hip pain   Other Standing Exercises  Stagger stance forward/back weightshifting x 10 reps each foot position for anterior/posterior weightshifting through hips        PT Education - 02/02/18 1804    Education Details  Discussion on how to talk to doctor about his mobility issues, voice volume, sleep; (pt did verbally give permission for PT to forward note to patient, but PT forgot to have patient fill out form for release of info); educated patient in safety with walking backwards and to avoid walking backwards and pulling/lifting items to avoid falls    Person(s) Educated  Patient    Methods  Explanation    Comprehension  Verbalized understanding       PT Short Term Goals - 01/21/18 1247      PT SHORT TERM GOAL #1   Title  The patient will return demo HEP for large amplitude movements, transitional movements for HEP.  (STG target date 02/07/2018)    Time  4    Period  Weeks      PT SHORT TERM GOAL #2   Title  The patient will improve Berg balance score from 44/56 to > or equal to 48/56 to demo dec'ing risk for falls.    Time   4    Period  Weeks      PT SHORT TERM GOAL #3   Title  The patient will perform 360 degree turn with < or equal to 12 steps (baseline is 18 steps).    Time  4    Period  Weeks      PT SHORT TERM GOAL #4   Title  The patient will be further assessed on FGA and goal to follow.    Time  4    Period  Weeks        PT Long Term Goals - 01/21/18 1247      PT LONG TERM GOAL #1   Title  The patient will be indep with progression of HEP.  (LTG target date 03/09/2018)    Time  8    Period  Weeks      PT LONG TERM GOAL #2   Title  The patient will improve 5 time sit<>stand to < or equal to 15 seconds (baseline 19.84 seconds).    Time  8    Period  Weeks      PT LONG TERM GOAL #3   Title  The patient will be further assessed on FGA and LTG to follow.    Time  8    Period  Weeks      PT LONG TERM GOAL #4   Title  The patient will perform 360 degree turn with < or equal to 8 steps (baseline is  18 steps).    Time  8    Period  Weeks            Plan - 02/02/18 1806    Clinical Impression Statement  Completed Functional Gait Assessment (FGA) today, with score 13/30, which indicates increased fall risk (scores <22/30 indicate increased fall risk).  Pt able to return demo turning technique and reports practicing at home.  Today, he notes difficulty with backwards walking, especially with pulling items or carrying things in the backwards direction; he also notes continued difficulty with holding head up during standing exercises (he tends to hold head flexed down toward chest and able to hold upright briefly with cues).  Pt will continue to benefit from skilled PT to further address gait, posture, and turns.    PT Treatment/Interventions  ADLs/Self Care Home Management;Therapeutic exercise;Balance training;Neuromuscular re-education;Gait training;Therapeutic activities;Patient/family education;Stair training;DME Instruction;Manual techniques    PT Next Visit Plan  Continue work on turning,  backwards walking, and postural strengthening; add to HEP as needed; follow-up on visit with Dr. Arlyss Gandy and Agree with Plan of Care  Patient       Patient will benefit from skilled therapeutic intervention in order to improve the following deficits and impairments:  Abnormal gait, Decreased endurance, Decreased activity tolerance, Decreased balance, Postural dysfunction  Visit Diagnosis: Unsteadiness on feet  Other abnormalities of gait and mobility     Problem List Patient Active Problem List   Diagnosis Date Noted  . Community acquired pneumonia of left lower lobe of lung ([place]) 12/23/2017  . Sepsis ([name]) 12/23/2017  . Morbid obesity due to excess calories ([place]) 08/23/2016  . OSA on CPAP 08/23/2016  . Dyspnea on exertion 08/22/2016  . Umbilical hernia 74/05/8785  . Persistent atrial fibrillation ([place])   . Encounter for therapeutic drug monitoring 08/12/2013  . Long term current use of anticoagulant 07/30/2010  . COLONIC POLYPS 06/03/2010  . 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 GERD 06/03/2010  . HIATAL HERNIA 06/03/2010  . BENIGN PROSTATIC HYPERTROPHY, WITH OBSTRUCTION 06/03/2010  . PSORIASIS 06/03/2010    Ronae Noell W. 02/02/2018, 6:15 PM Frazier Butt., PT  [facility] [address] [place], [place], [address] Phone: [phone]   Fax:  [phone]  Name: Stanley Wright MRN: [id number] Date of Birth: 1945/08/27

## 2018-02-03 ENCOUNTER — Other Ambulatory Visit: Payer: Self-pay | Admitting: Cardiology

## 2018-02-03 ENCOUNTER — Encounter: Payer: Self-pay | Admitting: Neurology

## 2018-02-03 ENCOUNTER — Ambulatory Visit (INDEPENDENT_AMBULATORY_CARE_PROVIDER_SITE_OTHER): Payer: PPO | Admitting: Neurology

## 2018-02-03 DIAGNOSIS — G20 Parkinson's disease: Secondary | ICD-10-CM

## 2018-02-03 DIAGNOSIS — G20A1 Parkinson's disease without dyskinesia, without mention of fluctuations: Secondary | ICD-10-CM

## 2018-02-03 HISTORY — DX: Parkinson's disease without dyskinesia, without mention of fluctuations: G20.A1

## 2018-02-03 HISTORY — DX: Parkinson's disease: G20

## 2018-02-03 MED ORDER — CARBIDOPA-LEVODOPA 25-100 MG PO TABS: 1.0000 | ORAL_TABLET | Freq: Three times a day (TID) | ORAL | 3 refills | Status: DC

## 2018-02-03 NOTE — Patient Instructions (Signed)
Begin Sinemet 25/100 mg taking 1/2 tablet three times a day for 3 weeks, then start one full tablet three times a day.  Sinemet (carbidopa) may result in confusion or hallucinations, drowsiness, nausea, or dizziness. If any significant side effects are noted, please contact our office. Sinemet may not be well absorbed when taken with high protein meals, if tolerated it is best to take 30-45 minutes before you eat.

## 2018-02-03 NOTE — Progress Notes (Signed)
Reason for visit: Gait disorder  Referring physician: Dr. Marga Hoots is a 72 y.o. male  History of present illness:  Stanley Wright is a 72 year old right-handed white male with a history of a progressive gait disorder that he claims began about 2 years ago.  The patient has more difficulty with his left leg than his right, he sometimes feels as if he is shuffling his feet, he has to shuffle to turn to prevent falls.  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  The patient denies any problems with swallowing but he does have some drooling on occasion.  He currently is in physical therapy.  He has had a fall 3 to 4 weeks ago, he did not sustain a significant injury.  He does have a history of atrial fibrillation, he underwent MRI of the brain on 10 December 2017 that shows mild small vessel disease.  The patient does report some back pain and some left hip pain, he also notes some neck pain, he has noted that his head is dropping down, he has difficulty keeping his head up.  He has noted that his handwriting has changed, it has become somewhat sloppy.  The patient reports no numbness or weakness of the extremities.  He denies issues controlling the bowels or the bladder.  Given the changes above, the patient is sent to this office for an evaluation.  He has not noted any significant change in memory.  The patient has not noted any tremors.  Past Medical History:  Diagnosis Date  . Arthritis   . BENIGN PROSTATIC HYPERTROPHY, WITH OBSTRUCTION   . Cancer (HCC)    skin, basal, squamous  . COLONIC POLYPS   . Diverticulitis   . DVT (deep venous thrombosis) ([place]) 2000  . Dysrhythmia    Hx Afib- 2017  . Factor 5 Leiden mutation, heterozygous ([place])   . GERD (gastroesophageal reflux disease)    not on medication  . HIATAL HERNIA   . ITP (idiopathic thrombocytopenic purpura) 2003  . Long term  current use of anticoagulant   . PSORIASIS   . Pulmonary embolus ([place]) 2000  . Shortness of breath dyspnea    at times - Talking alot  . Sleep apnea     Past Surgical History:  Procedure Laterality Date  . CARDIOVERSION N/A 11/22/2015   Procedure: CARDIOVERSION;  Surgeon: Sanda Klein, MD;  [facility];  Service: Cardiovascular;  Laterality: N/A;  . COLONOSCOPY    . HERNIA REPAIR Left    Inguinal- x2 . mesh x1  . INSERTION OF MESH N/A 02/25/2016   Procedure: INSERTION OF MESH;  Surgeon: Rolm Bookbinder, MD;  Location: [facility];  Service: General;  Laterality: N/A;  . LUNG REMOVAL, PARTIAL Right 2004  . PROSTATE SURGERY    . UMBILICAL HERNIA REPAIR N/A 02/25/2016   Procedure: LAPAROSCOPIC UMBILICAL HERNIA REPAIR;  Surgeon: Rolm Bookbinder, MD;  [facility];  Service: General;  Laterality: N/A;    Family History  Problem Relation Age of Onset  . Cancer Father   . Cancer Sister   . Heart disease Brother   . Cancer Maternal Grandmother   . Stroke Paternal Grandfather     Social history:  reports that he quit smoking about 72 years ago. He smoked 1.00 pack per day. He has never used smokeless tobacco. He reports that he does  not drink alcohol or use drugs.  Medications:  Prior to Admission medications   Medication Sig Start Date End Date Taking? Authorizing Provider  diltiazem (CARDIZEM CD) 360 MG 24 hr capsule Take 1 capsule (360 mg total) by mouth daily. Patient taking differently: Take 300 mg by mouth daily.  12/27/17  Yes Emokpae, Courage, MD  warfarin (COUMADIN) 5 MG tablet TAKE 1 TABLET BY MOUTH ONCE DAILY OR  AS  DIRECTED  BY  COUMADIN  CLINIC 12/01/17  Yes Crenshaw, Denice Bors, MD  albuterol (PROVENTIL HFA;VENTOLIN HFA) 108 (90 Base) MCG/ACT inhaler Inhale 1 puff into the lungs every 6 (six) hours as needed for wheezing or shortness of breath. Patient not taking: Reported on 01/08/2018 12/26/17   Roxan Hockey, MD  azithromycin (ZITHROMAX) 500 MG tablet Take 1  tablet (500 mg total) by mouth daily. Patient not taking: Reported on 01/08/2018 12/26/17   Roxan Hockey, MD  cefdinir (OMNICEF) 300 MG capsule Take 1 capsule (300 mg total) by mouth 2 (two) times daily. Patient not taking: Reported on 01/08/2018 12/26/17   Roxan Hockey, MD  guaiFENesin (MUCINEX) 600 MG 12 hr tablet Take 1 tablet (600 mg total) by mouth 2 (two) times daily. Patient not taking: Reported on 01/08/2018 12/26/17   Roxan Hockey, MD  metoprolol tartrate (LOPRESSOR) 25 MG tablet Take 1 tablet (25 mg total) by mouth 2 (two) times daily. Patient not taking: Reported on 02/03/2018 12/26/17   Roxan Hockey, MD  traMADol (ULTRAM) 50 MG tablet Take 1 tablet (50 mg total) by mouth every 6 (six) hours as needed. Patient not taking: Reported on 01/08/2018 09/23/16   Wendie Agreste, MD      Allergies  Allergen Reactions  . Tylenol [Acetaminophen] Other (See Comments)    Pt states he had a DNA test completed that stated he should never take tylenol.    ROS:  Out of a complete 14 system review of symptoms, the patient complains only of the following symptoms, and all other reviewed systems are negative.  Swelling in the legs Hearing loss Skin rash, birthmarks Loss of vision Weakness Not enough sleep Insomnia, sleepiness  Blood pressure 131/82, pulse 61, height 6' (1.829 m), weight 260 lb 8 oz (118.2 kg), SpO2 95 %.  Physical Exam  General: The patient is alert and cooperative at the time of the examination.  The patient is markedly obese.  Eyes: Pupils are equal, round, and reactive to light. Discs are flat bilaterally.  Neck: The neck is supple, no carotid bruits are noted.  Respiratory: The respiratory examination is clear.  Cardiovascular: The cardiovascular examination reveals an irregularly irregular heart rhythm, no obvious murmurs or rubs are noted.  Skin: Extremities are with 2+ edema below the knees bilaterally.  Neurologic Exam  Mental status: The  patient is alert and oriented x 3 at the time of the examination. The patient has apparent normal recent and remote memory, with an apparently normal attention span and concentration ability.  Cranial nerves: Facial symmetry is present. There is good sensation of the face to pinprick and soft touch bilaterally. The strength of the facial muscles and the muscles to head turning and shoulder shrug are normal bilaterally. Speech is well enunciated, no aphasia or dysarthria is noted. Extraocular movements are full. Visual fields are full. The tongue is midline, and the patient has symmetric elevation of the soft palate. No obvious hearing deficits are noted.  Mild masking of the face is seen.  Motor: The motor testing reveals 5 over  5 strength of all 4 extremities. Good symmetric motor tone is noted throughout.  Sensory: Sensory testing is intact to pinprick, soft touch, vibration sensation, and position sense on all 4 extremities. No evidence of extinction is noted.  Coordination: Cerebellar testing reveals good finger-nose-finger and heel-to-shin bilaterally.  Gait and station: The patient is able to rise from a seated position with some difficulty with his arms crossed.  Once up, he is able to ambulate without assistance, he has good stride, but there is a relative decrease in arm swing on the left as compared to the right.  With turning, the patient will shuffle his feet.  Romberg is negative.  Tandem gait is slightly unsteady.  Reflexes: Deep tendon reflexes are symmetric and normal bilaterally. Toes are downgoing bilaterally.   MRI brain 12/09/17:  IMPRESSION: No acute or reversible finding. No specific cause of the presenting symptoms is identified. Moderate chronic small-vessel ischemic changes of the cerebral hemispheric white matter.  * MRI scan images were reviewed online. I agree with the written report.    Assessment/Plan:  1.  Parkinson's disease  The patient appears to have  features of Parkinson's disease with masking of the face, a mild dropped head syndrome, decreased voice amplitude, change in walking and posture, and decreased arm swing on the left.  The patient will be started on low-dose Sinemet, eventually Requip or Mirapex will be added to this in the future.  The patient will start taking the 25/100 mg tablets of Sinemet, he will take 1/2 tablet 3 times daily for 3 weeks and then go to 1 full tablet 3 times daily.  He will follow-up in 5 months.  He will call for any dose adjustments.  Jill Alexanders MD 02/03/2018 9:30 AM  [facility] [address] [place], [place] [address]  Phone [phone] Fax [phone]

## 2018-02-04 ENCOUNTER — Encounter: Payer: Self-pay | Admitting: Physical Therapy

## 2018-02-04 ENCOUNTER — Ambulatory Visit: Payer: PPO | Admitting: Physical Therapy

## 2018-02-04 DIAGNOSIS — M47814 Spondylosis without myelopathy or radiculopathy, thoracic region: Secondary | ICD-10-CM | POA: Diagnosis not present

## 2018-02-04 DIAGNOSIS — R2689 Other abnormalities of gait and mobility: Secondary | ICD-10-CM | POA: Diagnosis not present

## 2018-02-04 DIAGNOSIS — R2681 Unsteadiness on feet: Secondary | ICD-10-CM

## 2018-02-04 DIAGNOSIS — M9902 Segmental and somatic dysfunction of thoracic region: Secondary | ICD-10-CM | POA: Diagnosis not present

## 2018-02-04 DIAGNOSIS — M9903 Segmental and somatic dysfunction of lumbar region: Secondary | ICD-10-CM | POA: Diagnosis not present

## 2018-02-04 DIAGNOSIS — M546 Pain in thoracic spine: Secondary | ICD-10-CM | POA: Diagnosis not present

## 2018-02-04 NOTE — Therapy (Signed)
[facility] [address] [place], [place], [address] Phone: [phone]   Fax:  [phone]  Physical Therapy Treatment  Patient Details  Name: Stanley Wright MRN: [id number] Date of Birth: Nov 04, 1945 Referring Provider: Maude Leriche, PA   Encounter Date: 02/04/2018  PT End of Session - 02/04/18 1849    Visit Number  6    Number of Visits  17    Date for PT Re-Evaluation  04/08/18    Authorization Type  [employer]    PT Start Time  0945   pt arrived early for 10:15 appt; PT available   PT Stop Time  1030    PT Time Calculation (min)  45 min    Activity Tolerance  Patient tolerated treatment well   denied incr pain at end of session   Behavior During Therapy  [facility] for tasks assessed/performed       Past Medical History:  Diagnosis Date  . Arthritis   . BENIGN PROSTATIC HYPERTROPHY, WITH OBSTRUCTION   . Cancer (HCC)    skin, basal, squamous  . COLONIC POLYPS   . Diverticulitis   . DVT (deep venous thrombosis) ([place]) 2000  . Dysrhythmia    Hx Afib- 2017  . Factor 5 Leiden mutation, heterozygous ([place])   . GERD (gastroesophageal reflux disease)    not on medication  . HIATAL HERNIA   . ITP (idiopathic thrombocytopenic purpura) 2003  . Long term current use of anticoagulant   . Parkinson's disease ([place]) 02/03/2018  . PSORIASIS   . Pulmonary embolus ([place]) 2000  . Shortness of breath dyspnea    at times - Talking alot  . Sleep apnea     Past Surgical History:  Procedure Laterality Date  . CARDIOVERSION N/A 11/22/2015   Procedure: CARDIOVERSION;  Surgeon: Sanda Klein, MD;  [facility];  Service: Cardiovascular;  Laterality: N/A;  . COLONOSCOPY    . HERNIA REPAIR Left    Inguinal- x2 . mesh x1  . INSERTION OF MESH N/A 02/25/2016   Procedure: INSERTION OF MESH;  Surgeon: Rolm Bookbinder, MD;  Location: [facility];  Service: General;  Laterality: N/A;  . LUNG REMOVAL, PARTIAL Right 2004   . PROSTATE SURGERY    . UMBILICAL HERNIA REPAIR N/A 02/25/2016   Procedure: LAPAROSCOPIC UMBILICAL HERNIA REPAIR;  Surgeon: Rolm Bookbinder, MD;  Location: [facility];  Service: General;  Laterality: N/A;    There were no vitals filed for this visit.  Subjective Assessment - 02/04/18 0951    Subjective  Saw Dr Jannifer Franklin yesterday. Re: Parkinson's diagnosis "not a big deal. Puts a name to it." My walking has improved and don't need to focus on that when I'm here. Still need to work on turning.     Pertinent History  sleep apnea, a-fib, factor V deficiency    Patient Stated Goals  "so I don't have the fear of falling everytime"; balance.    Currently in Pain?  No/denies    Pain Onset  --         [facility] PT Assessment - 02/04/18 0001      Berg Balance Test   Sit to Stand  Able to stand without using hands and stabilize independently    Standing Unsupported  Able to stand safely 2 minutes    Sitting with Back Unsupported but Feet Supported on Floor or Stool  Able to sit safely and securely 2 minutes    Stand to Sit  Sits safely with minimal use  of hands    Transfers  Able to transfer safely, minor use of hands    Standing Unsupported with Eyes Closed  Able to stand 10 seconds safely    Standing Ubsupported with Feet Together  Able to place feet together independently and stand 1 minute safely    From Standing, Reach Forward with Outstretched Arm  Can reach confidently >25 cm (10")    From Standing Position, Pick up Object from Floor  Able to pick up shoe safely and easily    From Standing Position, Turn to Look Behind Over each Shoulder  Looks behind from both sides and weight shifts well    Turn 360 Degrees  Able to turn 360 degrees safely but slowly   5.1 sec; 8 steps   Standing Unsupported, Alternately Place Feet on Step/Stool  Able to stand independently and safely and complete 8 steps in 20 seconds    Standing Unsupported, One Foot in Front  Able to plae foot ahead of the other  independently and hold 30 seconds    Standing on One Leg  Able to lift leg independently and hold 5-10 seconds   5.7. 6.6   Total Score  52                      [facility]) - 02/04/18 1906    PWR! exercises  Moves in supine;Moves in standing    PWR! Up  10    PWR! Rock  10    PWR! Twist  10    PWR! Step  4    Comments  Supine: limited time, however pt reporting standing PWR! exercises do not feel challenging therefore intiated education in supine PWR! Which pt enjoyed; incr time and tactile cues for PWR! Up    PWR! Rock  10    Comments  Standing: reviewed slowing pace and opening hands wide with stretching and turning head to look at his hand          PT Education - 02/04/18 1847    Education Details  Provided pt with August newsletter for [employer] Parkinson's support group (which includes many resources). Patient appreciative. Discussed we can discuss Parkinson's exercise classes in more detail at another time. Educated on role of OT and SLP with dealing with PD. Patient is interested in having a SLP evaluation done--frequently has to repeat things for people because they cannot understand him. Denied need for OT at this time.     Person(s) Educated  Patient    Methods  Explanation;Handout    Comprehension  Verbalized understanding;Need further instruction       PT Short Term Goals - 02/04/18 1851      PT SHORT TERM GOAL #1   Title  The patient will return demo HEP for large amplitude movements, transitional movements for HEP.  (STG target date 02/07/2018)    Time  4    Period  Weeks    Status  Achieved      PT SHORT TERM GOAL #2   Title  The patient will improve Berg balance score from 44/56 to > or equal to 48/56 to demo dec'ing risk for falls.    Baseline  02/04/18 52/56    Time  4    Period  Weeks    Status  Achieved      PT SHORT TERM GOAL #3   Title  The patient will perform 360 degree turn with < or equal to 12 steps (baseline is  18 steps).     Baseline  02/04/18 8 steps    Time  4    Period  Weeks    Status  Achieved      PT SHORT TERM GOAL #4   Title  The patient will be further assessed on FGA and goal to follow.    Baseline  02/01/18 13/30    Time  4    Period  Weeks    Status  Achieved        PT Long Term Goals - 02/04/18 1854      PT LONG TERM GOAL #1   Title  The patient will be indep with progression of HEP.  (LTG target date 03/09/2018)    Time  8    Period  Weeks      PT LONG TERM GOAL #2   Title  The patient will improve 5 time sit<>stand to < or equal to 15 seconds (baseline 19.84 seconds).    Time  8    Period  Weeks      PT LONG TERM GOAL #3   Title  The patient will be further assessed on FGA and LTG to follow. 02/04/18 Patient will improve FGA to >=17/30 (4 pts being [place] for PD)    Time  8    Period  Weeks    Status  Revised      PT LONG TERM GOAL #4   Title  The patient will perform 360 degree turn with < or equal to 8 steps (baseline is 18 steps). 02/04/18 8 steps (assessed at STGs)    Time  8    Period  Weeks    Status  Achieved            Plan - 02/04/18 1858    Clinical Impression Statement  STGs assessed with pt meeting 4 of 4 goals. Where appropriate LTGs were updated/revised. Patient was given diagnosis of Parkinson's Disease yesterday and had no specific questions today. Provided list of references (online, support groups) for patient to consider (or to provide to his family). Patient maintains a positive attitude and has already made significant progress.     Rehab Potential  Good    PT Frequency  2x / week    PT Duration  8 weeks    PT Treatment/Interventions  ADLs/Self Care Home Management;Therapeutic exercise;Balance training;Neuromuscular re-education;Gait training;Therapeutic activities;Patient/family education;Stair training;DME Instruction;Manual techniques    PT Next Visit Plan  Continue work on turning, backwards walking, and postural strengthening; review supine PWR!  exercises given 8/22 and review standing with emphasis on stretching and slowing down    Recommended Other Services  Discussed SLP referral with patient and will seek order from physician    Consulted and Agree with Plan of Care  Patient       Patient will benefit from skilled therapeutic intervention in order to improve the following deficits and impairments:  Abnormal gait, Decreased endurance, Decreased activity tolerance, Decreased balance, Postural dysfunction  Visit Diagnosis: Unsteadiness on feet  Other abnormalities of gait and mobility     Problem List Patient Active Problem List   Diagnosis Date Noted  . Parkinson's disease ([place]) 02/03/2018  . Community acquired pneumonia of left lower lobe of lung ([place]) 12/23/2017  . Sepsis ([place]) 12/23/2017  . Morbid obesity due to excess calories ([place]) 08/23/2016  . OSA on CPAP 08/23/2016  . Dyspnea on exertion 08/22/2016  . Umbilical hernia 23/76/2831  . Persistent atrial fibrillation ([place])   . Encounter for  therapeutic drug monitoring 08/12/2013  . Long term current use of anticoagulant 07/30/2010  . COLONIC POLYPS 06/03/2010  . 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 GERD 06/03/2010  . HIATAL HERNIA 06/03/2010  . BENIGN PROSTATIC HYPERTROPHY, WITH OBSTRUCTION 06/03/2010  . PSORIASIS 06/03/2010    Rexanne Mano, PT 02/04/2018, 7:09 PM  [facility] [address] [place], [place], [address] Phone: [phone]   Fax:  [phone]  Name: TRAVAS SCHEXNAYDER MRN: [id number] Date of Birth: [birth year]

## 2018-02-05 ENCOUNTER — Telehealth: Payer: Self-pay | Admitting: Neurology

## 2018-02-05 DIAGNOSIS — G20 Parkinson's disease: Secondary | ICD-10-CM

## 2018-02-05 NOTE — Telephone Encounter (Signed)
-----   Message from Rexanne Mano, PT sent at 02/04/2018  7:15 PM EDT ----- Dr. Jannifer Franklin, Mr. Kem Hensen would benefit from SLP evaluation for phonation/Parkinson's related speech changes .   If you agree, please place an order in [facility] in [facility] or fax the order to [phone]. Thank you,  Barry Brunner, PT Outpatient Neurorehabilitation [address] [place], [place] [address] [phone]

## 2018-02-05 NOTE — Telephone Encounter (Signed)
I called the patient.  The physical therapist has recommended speech therapy to improve voice amplitude, I called the patient and he is amenable to this therapy.  I will order speech therapy for him.

## 2018-02-08 ENCOUNTER — Ambulatory Visit: Payer: PPO

## 2018-02-08 ENCOUNTER — Ambulatory Visit: Payer: PPO | Admitting: Physical Therapy

## 2018-02-08 ENCOUNTER — Encounter: Payer: Self-pay | Admitting: Physical Therapy

## 2018-02-08 DIAGNOSIS — R2681 Unsteadiness on feet: Secondary | ICD-10-CM

## 2018-02-08 DIAGNOSIS — R471 Dysarthria and anarthria: Secondary | ICD-10-CM

## 2018-02-08 DIAGNOSIS — R2689 Other abnormalities of gait and mobility: Secondary | ICD-10-CM | POA: Diagnosis not present

## 2018-02-08 DIAGNOSIS — M546 Pain in thoracic spine: Secondary | ICD-10-CM | POA: Diagnosis not present

## 2018-02-08 DIAGNOSIS — M9902 Segmental and somatic dysfunction of thoracic region: Secondary | ICD-10-CM | POA: Diagnosis not present

## 2018-02-08 DIAGNOSIS — M9903 Segmental and somatic dysfunction of lumbar region: Secondary | ICD-10-CM | POA: Diagnosis not present

## 2018-02-08 DIAGNOSIS — M47814 Spondylosis without myelopathy or radiculopathy, thoracic region: Secondary | ICD-10-CM | POA: Diagnosis not present

## 2018-02-08 NOTE — Therapy (Signed)
[facility] [address] [place], [place], [address] Phone: [phone]   Fax:  [phone]  Speech Language Pathology Evaluation  Patient Details  Name: Stanley Wright MRN: [id number] Date of Birth: [birth year] Referring Provider: Neldon Newport., MD   Encounter Date: 02/08/2018  End of Session - 02/08/18 1200    Visit Number  1    Number of Visits  17    Date for SLP Re-Evaluation  04/30/18    SLP Start Time  0850    SLP Stop Time   0930    SLP Time Calculation (min)  40 min    Activity Tolerance  Patient tolerated treatment well       Past Medical History:  Diagnosis Date  . Arthritis   . BENIGN PROSTATIC HYPERTROPHY, WITH OBSTRUCTION   . Cancer (HCC)    skin, basal, squamous  . COLONIC POLYPS   . Diverticulitis   . DVT (deep venous thrombosis) ([place]) 2000  . Dysrhythmia    Hx Afib- 2017  . Factor 5 Leiden mutation, heterozygous ([place])   . GERD (gastroesophageal reflux disease)    not on medication  . HIATAL HERNIA   . ITP (idiopathic thrombocytopenic purpura) 2003  . Long term current use of anticoagulant   . Parkinson's disease ([place]) 02/03/2018  . PSORIASIS   . Pulmonary embolus ([place]) 2000  . Shortness of breath dyspnea    at times - Talking alot  . Sleep apnea     Past Surgical History:  Procedure Laterality Date  . CARDIOVERSION N/A 11/22/2015   Procedure: CARDIOVERSION;  Surgeon: Sanda Klein, MD;  [facility];  Service: Cardiovascular;  Laterality: N/A;  . COLONOSCOPY    . HERNIA REPAIR Left    Inguinal- x2 . mesh x1  . INSERTION OF MESH N/A 02/25/2016   Procedure: INSERTION OF MESH;  Surgeon: Rolm Bookbinder, MD;  Location: [facility];  Service: General;  Laterality: N/A;  . LUNG REMOVAL, PARTIAL Right 2004  . PROSTATE SURGERY    . UMBILICAL HERNIA REPAIR N/A 02/25/2016   Procedure: LAPAROSCOPIC UMBILICAL HERNIA REPAIR;  Surgeon: Rolm Bookbinder, MD;  Location: [place] [facility];  Service:  General;  Laterality: N/A;    There were no vitals filed for this visit.  Subjective Assessment - 02/08/18 0905    Subjective  "I've noticed it for about 3 years now. 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 Pt denies difficulty with meals but endorses drooling.      Balance Screen   Has  the patient fallen in the past 6 months  --   currently in PT     Prior Functional Status   Cognitive/Linguistic Baseline  Within functional limits      Cognition   Overall Cognitive Status  Within Functional Limits for tasks assessed      Auditory Comprehension   Overall Auditory Comprehension  Appears within functional limits for tasks assessed      Verbal Expression   Overall Verbal Expression  Appears within functional limits for tasks assessed      Oral Motor/Sensory Function   Overall Oral Motor/Sensory Function  Impaired    Labial ROM  Within Functional Limits    Labial Symmetry  Within Functional Limits    Labial Strength  Reduced    Labial Coordination  Reduced    Lingual ROM  Reduced right;Reduced left   improved with cues to hit back teeth   Lingual Symmetry  Within Functional Limits    Lingual Strength  Reduced Right   slight   Velum  Within Functional Limits      Motor Speech   Overall  Motor Speech  Impaired    Respiration  Impaired    Level of Impairment  Phrase    Phonation  Low vocal intensity   5 minutes conversation was mid 60s dB   Articulation  Impaired    Level of Impairment  Phrase   short rushes of speech   Intelligibility  Intelligibility reduced    Conversation  --   95%   Effective Techniques  Increased vocal intensity   loud /a/ -upper 80sdB; 45-sec monologue- average low 70s dB                     SLP Education - 02/08/18 1159    Education Details  loud /a/, think "LOUD" when talking    Person(s) Educated  Patient    Methods  Explanation;Demonstration;Verbal cues    Comprehension  Verbalized understanding;Returned demonstration;Need further instruction;Verbal cues required       SLP Short Term Goals - 02/08/18 1204      SLP SHORT TERM GOAL #1   Title  pt will produce loud /a/ at average low 90s dB over 4 sessions    Time  4    Period  Weeks    Status  New      SLP SHORT TERM GOAL #2   Title  pt will generate 18/20 sentences with WNL average volume over 3 sessions    Time  4    Period  Weeks    Status  New      SLP SHORT TERM GOAL #3   Title  pt will use abdominal breathing at rest 85% success over 2 sessions    Time  4    Period  Weeks    Status  New      SLP SHORT TERM GOAL #4   Title  In 7 minutes simple conversation pt will achieve average speech volume of low 70s dB over two sessions    Time  4    Period  Weeks    Status  New       SLP Long Term Goals - 02/08/18 1206      SLP LONG TERM GOAL #1   Title  Pt will generate loud /a/ with average of low 90s dB over 6 sessions    Time  8    Period  Weeks    Status  New  SLP LONG TERM GOAL #2   Title  pt will use abdominal breathing 70% of the time in simple-mod complex conversation over three sessions    Time  8    Period  Weeks    Status  New      SLP LONG TERM GOAL #3   Title  In 10 minutes simple-mod complex conversation pt will achieve average  speech volume of low 70s dB over three sessions    Time  8    Period  Weeks    Status  New       Plan - 02/08/18 1200    Clinical Impression Statement  Pt presents today with lower than normal voice volume average mid 60s db in 5 minutes sipmle to mod complex conversation, along with rare short rushes of speech, which in conjucntion degraded speech intelligibility to 95%. However after loud /a/ production SLP told pt to focus on the effort necessary to produce loud /a/ and pt's conversational intensity improved to low 70s dB with a 45 second monologue task. This performance suggests pt is a good candidate for ST. He would benefit from skilled ST focusing on pt's goal of improving commnicative ability with family and in the community.    Speech Therapy Frequency  2x / week    Duration  --   8 weeks, or 17 visits   Treatment/Interventions  SLP instruction and feedback;Compensatory strategies;Patient/family education;Internal/external aids;Functional tasks;Cueing hierarchy;Environmental controls    Potential to Achieve Goals  Good       Patient will benefit from skilled therapeutic intervention in order to improve the following deficits and impairments:   Dysarthria and anarthria - Plan: SLP plan of care cert/re-cert    Problem List Patient Active Problem List   Diagnosis Date Noted  . Parkinson's disease ([place]) 02/03/2018  . Community acquired pneumonia of left lower lobe of lung ([place]) 12/23/2017  . Sepsis ([place]) 12/23/2017  . Morbid obesity due to excess calories ([place]) 08/23/2016  . OSA on CPAP 08/23/2016  . Dyspnea on exertion 08/22/2016  . Umbilical hernia 49/44/9675  . Persistent atrial fibrillation ([place])   . Encounter for therapeutic drug monitoring 08/12/2013  . Long term current use of anticoagulant 07/30/2010  . COLONIC POLYPS 06/03/2010  . 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 GERD 06/03/2010  . HIATAL HERNIA 06/03/2010  . BENIGN PROSTATIC HYPERTROPHY, WITH OBSTRUCTION  06/03/2010  . PSORIASIS 06/03/2010    [place] ,[place], [place]  02/08/2018, 12:13 PM  [facility] [address] [place], [place], [address] Phone: [phone]   Fax:  [phone]  Name: CHASETON YEPIZ MRN: [id number] Date of Birth: 1945-08-15

## 2018-02-08 NOTE — Patient Instructions (Signed)
  Practice loud "ah" 5 times, twice a day.  When you talk to others, focus on "LOUD"!

## 2018-02-08 NOTE — Therapy (Signed)
[facility] [address] [place], [place], [address] Phone: [phone]   Fax:  [phone]  Physical Therapy Treatment  Patient Details  Name: Stanley Wright MRN: [id number] Date of Birth: Sep 02, 1945 Referring Provider: Maude Leriche, PA   Encounter Date: 02/08/2018  PT End of Session - 02/08/18 2142    Visit Number  7    Number of Visits  17    Date for PT Re-Evaluation  04/08/18    Authorization Type  [employer]    PT Start Time  1022    PT Stop Time  1048   Pt requests to end early due to hip pain    PT Time Calculation (min)  26 min    Activity Tolerance  Patient limited by pain   denied incr pain at end of session   Behavior During Therapy  [facility] for tasks assessed/performed       Past Medical History:  Diagnosis Date  . Arthritis   . BENIGN PROSTATIC HYPERTROPHY, WITH OBSTRUCTION   . Cancer (HCC)    skin, basal, squamous  . COLONIC POLYPS   . Diverticulitis   . DVT (deep venous thrombosis) ([place]) 2000  . Dysrhythmia    Hx Afib- 2017  . Factor 5 Leiden mutation, heterozygous ([place])   . GERD (gastroesophageal reflux disease)    not on medication  . HIATAL HERNIA   . ITP (idiopathic thrombocytopenic purpura) 2003  . Long term current use of anticoagulant   . Parkinson's disease ([place]) 02/03/2018  . PSORIASIS   . Pulmonary embolus ([place]) 2000  . Shortness of breath dyspnea    at times - Talking alot  . Sleep apnea     Past Surgical History:  Procedure Laterality Date  . CARDIOVERSION N/A 11/22/2015   Procedure: CARDIOVERSION;  Surgeon: Sanda Klein, MD;  [facility];  Service: Cardiovascular;  Laterality: N/A;  . COLONOSCOPY    . HERNIA REPAIR Left    Inguinal- x2 . mesh x1  . INSERTION OF MESH N/A 02/25/2016   Procedure: INSERTION OF MESH;  Surgeon: Rolm Bookbinder, MD;  Location: [place];  Service: General;  Laterality: N/A;  . LUNG REMOVAL, PARTIAL Right 2004  . PROSTATE  SURGERY    . UMBILICAL HERNIA REPAIR N/A 02/25/2016   Procedure: LAPAROSCOPIC UMBILICAL HERNIA REPAIR;  Surgeon: Rolm Bookbinder, MD;  Location: [facility];  Service: General;  Laterality: N/A;    There were no vitals filed for this visit.  Subjective Assessment - 02/08/18 1024    Subjective  having to use the cane today due to excrutiating pain in L hip since last visit.    Pertinent History  sleep apnea, a-fib, factor V deficiency    Patient Stated Goals  "so I don't have the fear of falling everytime"; balance.    Currently in Pain?  Yes    Pain Score  9     Pain Location  Hip    Pain Orientation  Left    Pain Descriptors / Indicators  Aching;Stabbing    Pain Type  Chronic pain    Pain Onset  More than a month ago   worse since last last week   Pain Frequency  Intermittent    Aggravating Factors   walking    Pain Relieving Factors  have not been able to slow it down this weekend at all                   Self Care:  Assessed pt's L hip pain (see above ratings in subjective portion):  Pt is tender to palpation along L iliotibial band, especially proximal femur area.  PT provides massage along IT band and does palpate tight, tender areas, but pt does not report any significant increase in pain along these areas.  Pt also notes pain to palpation along distal anterior thigh.  PT questions if there is some IT band involvement, and educated patient in massage to IT band for pain control.  Attempted seated hip external rotation stretch, with pt able to achieve and hold on RLE, unable to achieve position on LLE.  Pt reports increased pain in LLE weightbearing with gait.  Educated patient in fall prevention in home environment, including proper use of assistive device for safety (especially on days when more unstable due to pain)  Answered pt's questions regarding PD medication and role of neurologist versus MOvement Disorders specialist in Parkinson's disease  patients.            PT Education - 02/08/18 2141    Education Details  Discussed pt's pain in relation to decreased funcitonal mobility, recommendations for MD follow-up due to acute nature and high intensity of pain with weightbearing; fall prevention education    Person(s) Educated  Patient    Methods  Explanation    Comprehension  Verbalized understanding       PT Short Term Goals - 02/04/18 1851      PT SHORT TERM GOAL #1   Title  The patient will return demo HEP for large amplitude movements, transitional movements for HEP.  (STG target date 02/07/2018)    Time  4    Period  Weeks    Status  Achieved      PT SHORT TERM GOAL #2   Title  The patient will improve Berg balance score from 44/56 to > or equal to 48/56 to demo dec'ing risk for falls.    Baseline  02/04/18 52/56    Time  4    Period  Weeks    Status  Achieved      PT SHORT TERM GOAL #3   Title  The patient will perform 360 degree turn with < or equal to 12 steps (baseline is 18 steps).    Baseline  02/04/18 8 steps    Time  4    Period  Weeks    Status  Achieved      PT SHORT TERM GOAL #4   Title  The patient will be further assessed on FGA and goal to follow.    Baseline  02/01/18 13/30    Time  4    Period  Weeks    Status  Achieved        PT Long Term Goals - 02/04/18 1854      PT LONG TERM GOAL #1   Title  The patient will be indep with progression of HEP.  (LTG target date 03/09/2018)    Time  8    Period  Weeks      PT LONG TERM GOAL #2   Title  The patient will improve 5 time sit<>stand to < or equal to 15 seconds (baseline 19.84 seconds).    Time  8    Period  Weeks      PT LONG TERM GOAL #3   Title  The patient will be further assessed on FGA and LTG to follow. 02/04/18 Patient will improve FGA to >=17/30 (4 pts being [place] for PD)  Time  8    Period  Weeks    Status  Revised      PT LONG TERM GOAL #4   Title  The patient will perform 360 degree turn with < or equal to 8 steps  (baseline is 18 steps). 02/04/18 8 steps (assessed at STGs)    Time  8    Period  Weeks    Status  Achieved            Plan - 02/08/18 2143    Clinical Impression Statement  Pt limited with mobility today due to significant pain in L hip and L quads.  He feels this is the worst it has ever been, and he is using cane due to increased difficulty with LLE weightbearing.  While patient does have history of L hip pain, this pain appears more acute in nature and more intense.  He does not report any falls since last PT visit.  Recommended if pain worsens or continues to limit mobility, he should follow up with MD.    Rehab Potential  Good    PT Frequency  2x / week    PT Duration  8 weeks    PT Treatment/Interventions  ADLs/Self Care Home Management;Therapeutic exercise;Balance training;Neuromuscular re-education;Gait training;Therapeutic activities;Patient/family education;Stair training;DME Instruction;Manual techniques    PT Next Visit Plan  Follow up on L hip pain (has pt followed up with MD?); continue to work on postural strengthening, turning, backwards walking; review supine PWR! Moves given 02/04/18    Consulted and Agree with Plan of Care  Patient       Patient will benefit from skilled therapeutic intervention in order to improve the following deficits and impairments:  Abnormal gait, Decreased endurance, Decreased activity tolerance, Decreased balance, Postural dysfunction  Visit Diagnosis: Unsteadiness on feet     Problem List Patient Active Problem List   Diagnosis Date Noted  . Parkinson's disease ([place]) 02/03/2018  . Community acquired pneumonia of left lower lobe of lung ([place]) 12/23/2017  . Sepsis ([place]) 12/23/2017  . Morbid obesity due to excess calories ([place]) 08/23/2016  . OSA on CPAP 08/23/2016  . Dyspnea on exertion 08/22/2016  . Umbilical hernia 95/63/8756  . Persistent atrial fibrillation ([place])   . Encounter for therapeutic drug monitoring 08/12/2013  . Long  term current use of anticoagulant 07/30/2010  . COLONIC POLYPS 06/03/2010  . 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 GERD 06/03/2010  . HIATAL HERNIA 06/03/2010  . BENIGN PROSTATIC HYPERTROPHY, WITH OBSTRUCTION 06/03/2010  . PSORIASIS 06/03/2010    Maggy Wyble W. 02/08/2018, 9:47 PM  Frazier Butt., PT   [facility] [address] [place], [place], [address] Phone: [phone]   Fax:  [phone]  Name: ELIU BATCH MRN: [id number] Date of Birth: 24-Jun-1945

## 2018-02-10 ENCOUNTER — Ambulatory Visit: Payer: PPO | Admitting: Physical Therapy

## 2018-02-10 ENCOUNTER — Ambulatory Visit: Payer: PPO

## 2018-02-10 ENCOUNTER — Encounter: Payer: Self-pay | Admitting: Neurology

## 2018-02-10 DIAGNOSIS — M546 Pain in thoracic spine: Secondary | ICD-10-CM | POA: Diagnosis not present

## 2018-02-10 DIAGNOSIS — M47814 Spondylosis without myelopathy or radiculopathy, thoracic region: Secondary | ICD-10-CM | POA: Diagnosis not present

## 2018-02-10 DIAGNOSIS — M9902 Segmental and somatic dysfunction of thoracic region: Secondary | ICD-10-CM | POA: Diagnosis not present

## 2018-02-10 DIAGNOSIS — M9903 Segmental and somatic dysfunction of lumbar region: Secondary | ICD-10-CM | POA: Diagnosis not present

## 2018-02-11 DIAGNOSIS — M47814 Spondylosis without myelopathy or radiculopathy, thoracic region: Secondary | ICD-10-CM | POA: Diagnosis not present

## 2018-02-11 DIAGNOSIS — M546 Pain in thoracic spine: Secondary | ICD-10-CM | POA: Diagnosis not present

## 2018-02-11 DIAGNOSIS — M9903 Segmental and somatic dysfunction of lumbar region: Secondary | ICD-10-CM | POA: Diagnosis not present

## 2018-02-11 DIAGNOSIS — M9902 Segmental and somatic dysfunction of thoracic region: Secondary | ICD-10-CM | POA: Diagnosis not present

## 2018-02-16 ENCOUNTER — Ambulatory Visit: Payer: PPO | Admitting: Physical Therapy

## 2018-02-16 ENCOUNTER — Ambulatory Visit (INDEPENDENT_AMBULATORY_CARE_PROVIDER_SITE_OTHER): Payer: PPO

## 2018-02-16 DIAGNOSIS — I4819 Other persistent atrial fibrillation: Secondary | ICD-10-CM

## 2018-02-16 DIAGNOSIS — I481 Persistent atrial fibrillation: Secondary | ICD-10-CM | POA: Diagnosis not present

## 2018-02-16 DIAGNOSIS — Z5181 Encounter for therapeutic drug level monitoring: Secondary | ICD-10-CM

## 2018-02-16 LAB — POCT INR: INR: 2.1 (ref 2.0–3.0)

## 2018-02-16 NOTE — Patient Instructions (Signed)
Description   Continue same dosage of 1 tablet every day. Recheck INR in 6 weeks. Call with any questions if gets any new medications or scheduled for any procedures  [phone]    

## 2018-02-17 DIAGNOSIS — M9903 Segmental and somatic dysfunction of lumbar region: Secondary | ICD-10-CM | POA: Diagnosis not present

## 2018-02-17 DIAGNOSIS — M47814 Spondylosis without myelopathy or radiculopathy, thoracic region: Secondary | ICD-10-CM | POA: Diagnosis not present

## 2018-02-17 DIAGNOSIS — M9902 Segmental and somatic dysfunction of thoracic region: Secondary | ICD-10-CM | POA: Diagnosis not present

## 2018-02-17 DIAGNOSIS — M546 Pain in thoracic spine: Secondary | ICD-10-CM | POA: Diagnosis not present

## 2018-02-18 ENCOUNTER — Ambulatory Visit: Payer: PPO | Admitting: Physical Therapy

## 2018-02-18 DIAGNOSIS — M546 Pain in thoracic spine: Secondary | ICD-10-CM | POA: Diagnosis not present

## 2018-02-18 DIAGNOSIS — M9902 Segmental and somatic dysfunction of thoracic region: Secondary | ICD-10-CM | POA: Diagnosis not present

## 2018-02-18 DIAGNOSIS — M9903 Segmental and somatic dysfunction of lumbar region: Secondary | ICD-10-CM | POA: Diagnosis not present

## 2018-02-18 DIAGNOSIS — M47814 Spondylosis without myelopathy or radiculopathy, thoracic region: Secondary | ICD-10-CM | POA: Diagnosis not present

## 2018-02-22 ENCOUNTER — Encounter: Payer: Self-pay | Admitting: Physical Therapy

## 2018-02-22 ENCOUNTER — Ambulatory Visit: Payer: PPO | Attending: Physician Assistant | Admitting: Physical Therapy

## 2018-02-22 ENCOUNTER — Ambulatory Visit: Payer: PPO | Admitting: Speech Pathology

## 2018-02-22 DIAGNOSIS — R471 Dysarthria and anarthria: Secondary | ICD-10-CM | POA: Diagnosis not present

## 2018-02-22 DIAGNOSIS — M47814 Spondylosis without myelopathy or radiculopathy, thoracic region: Secondary | ICD-10-CM | POA: Diagnosis not present

## 2018-02-22 DIAGNOSIS — R2681 Unsteadiness on feet: Secondary | ICD-10-CM | POA: Diagnosis not present

## 2018-02-22 DIAGNOSIS — M546 Pain in thoracic spine: Secondary | ICD-10-CM | POA: Diagnosis not present

## 2018-02-22 DIAGNOSIS — M9903 Segmental and somatic dysfunction of lumbar region: Secondary | ICD-10-CM | POA: Diagnosis not present

## 2018-02-22 DIAGNOSIS — R2689 Other abnormalities of gait and mobility: Secondary | ICD-10-CM

## 2018-02-22 DIAGNOSIS — M9902 Segmental and somatic dysfunction of thoracic region: Secondary | ICD-10-CM | POA: Diagnosis not present

## 2018-02-22 NOTE — Patient Instructions (Addendum)
HIP / KNEE: Extension - Sit to Stand    From a higher height than a normal chair, but lower than your bed (approx 24"):  Sitting, lean chest forward, raise hips up from surface. Straighten hips and knees. Weight bear equally on left and right sides. Backs of legs should not push off surface.  You will want to make sure to scoot to the edge, tuck your feet under your knees, and fully stand tall.  Slowly lower down to sit. _5-10__ reps per set, __2_ sets per day.    Copyright  VHI. All rights reserved.  Toe / Heel Raise    Gently rock back on heels and raise toes. Then rock forward on toes and raise heels. Repeat sequence _2 sets of 10___ times per session. Do __5__ sessions per week.  Copyright  VHI. All rights reserved.  Backward Walking    Along counter, walk forward, then walk backward, toes of each foot coming down first. Take even strides, one foot beyond the other. Make sure you have a clear pathway with no obstructions when you do this.  Repeat 3-5 times the length of the counter-slow and controlled.   Copyright  VHI. All rights reserved.  Single Step: Forward / Backward    Lifting foot off floor, take one step slowly forward with right leg. Then swing through and take one step backward.  Repeat 10 times on one leg, then switch leg positions. Repeat __10__ times with other leg. Do _1-2___ sessions per day.   Copyright  VHI. All rights reserved.

## 2018-02-22 NOTE — Therapy (Signed)
[facility] [address] [place], [place], [address] Phone: [phone]   Fax:  [phone]  Physical Therapy Treatment  Patient Details  Name: Stanley Wright MRN: [id number] Date of Birth: February 01, 1946 Referring Provider: Maude Leriche, PA   Encounter Date: 02/22/2018  PT End of Session - 02/22/18 1212    Visit Number  8    Number of Visits  17    Date for PT Re-Evaluation  04/08/18    Authorization Type  [employer]    PT Start Time  1017    PT Stop Time  1101    PT Time Calculation (min)  44 min    Activity Tolerance  Patient tolerated treatment well   No increase in hip pain during PT session today   Behavior During Therapy  [facility] for tasks assessed/performed       Past Medical History:  Diagnosis Date  . Arthritis   . BENIGN PROSTATIC HYPERTROPHY, WITH OBSTRUCTION   . Cancer (HCC)    skin, basal, squamous  . COLONIC POLYPS   . Diverticulitis   . DVT (deep venous thrombosis) ([place]) 2000  . Dysrhythmia    Hx Afib- 2017  . Factor 5 Leiden mutation, heterozygous ([place])   . GERD (gastroesophageal reflux disease)    not on medication  . HIATAL HERNIA   . ITP (idiopathic thrombocytopenic purpura) 2003  . Long term current use of anticoagulant   . Parkinson's disease ([place]) 02/03/2018  . PSORIASIS   . Pulmonary embolus ([place]) 2000  . Shortness of breath dyspnea    at times - Talking alot  . Sleep apnea     Past Surgical History:  Procedure Laterality Date  . CARDIOVERSION N/A 11/22/2015   Procedure: CARDIOVERSION;  Surgeon: Sanda Klein, MD;  [facility];  Service: Cardiovascular;  Laterality: N/A;  . COLONOSCOPY    . HERNIA REPAIR Left    Inguinal- x2 . mesh x1  . INSERTION OF MESH N/A 02/25/2016   Procedure: INSERTION OF MESH;  Surgeon: Rolm Bookbinder, MD;  Location: [place];  Service: General;  Laterality: N/A;  . LUNG REMOVAL, PARTIAL Right 2004  . PROSTATE SURGERY    . UMBILICAL  HERNIA REPAIR N/A 02/25/2016   Procedure: LAPAROSCOPIC UMBILICAL HERNIA REPAIR;  Surgeon: Rolm Bookbinder, MD;  Location: [facility];  Service: General;  Laterality: N/A;    There were no vitals filed for this visit.  Subjective Assessment - 02/22/18 1020    Subjective  Will be seeing Dr. Kristeen Miss for my back/hip pain on October 3rd.  I cancelled last week for PT to try to calm the nerves.    Pertinent History  sleep apnea, a-fib, factor V deficiency    Patient Stated Goals  "so I don't have the fear of falling everytime"; balance.    Currently in Pain?  Yes    Pain Score  3     Pain Location  Hip    Pain Orientation  Left    Pain Descriptors / Indicators  Aching   grabbing   Pain Onset  More than a month ago   worse since last last week   Pain Frequency  Intermittent    Aggravating Factors   certain exercises including hip rotation stretch, and leg kicks    Pain Relieving Factors  Icy hot, pain patches, hamstring stretches (supine)  [facility] Adult PT Treatment/Exercise - 02/22/18 0001      Transfers   Transfers  Sit to Stand;Stand to Sit    Sit to Stand  6: Modified independent (Device/Increase time);With upper extremity assist;From chair/3-in-1;Without upper extremity assist;From bed   No UE support from elevate bed surface   Stand to Sit  6: Modified independent (Device/Increase time);With upper extremity assist;To chair/3-in-1;Without upper extremity assist;To bed;Uncontrolled descent   Cues to slow descent into sitting   Number of Reps  Other reps (comment);Other sets (comment)   5 reps from 24" mat, 5 reps from 18" chair w/ UE support   Transfer Cueing  Cues for forward scoot, for foot placement, for forward lean and upright standing, for functional lower extremity strengthening.  Pt has no c/o hip pain with elevated surface sit<>stand, but c/o pain in L hip and thigh with sit<>stand from 18" chair.          Balance Exercises - 02/22/18  1024      Balance Exercises: Standing   Wall Bumps  Hip    Wall Bumps-Hips  Eyes opened;Anterior/posterior;10 reps   2 sets   Stepping Strategy  Anterior;Posterior;10 reps;UE support   Progress to forward/back stepping 10 reps    Gait with Head Turns  Forward;Upper extremity support;3 reps   Progress to hallway walking, head nods and head turns   Tandem Gait  Forward;Upper extremity support;1 rep    Retro Gait  Upper extremity support;5 reps   Forward/back walking at Psychologist, forensic in place x 10 reps, 2 sets    Heel Raises Limitations  2 sets x 10 reps    Toe Raise Limitations  2 sets x 10 reps    Other Standing Exercises  Stagger stance forward and back weightshifting x 10 reps, with bilateral UE support, cues for slowed pace to increase weightshifting.  Explained how marching in place, forward/back stepping, and stagger stance weightshifting can be used to initiate gait in times of gait hesitation.      Throughout standing exercises, pt requires cues to slow pace of movement patterns.  PT Education - 02/22/18 1211    Education Details  Updates to HEP-see instructions    Person(s) Educated  Patient    Methods  Explanation;Demonstration;Handout    Comprehension  Verbalized understanding;Returned demonstration       PT Short Term Goals - 02/04/18 1851      PT SHORT TERM GOAL #1   Title  The patient will return demo HEP for large amplitude movements, transitional movements for HEP.  (STG target date 02/07/2018)    Time  4    Period  Weeks    Status  Achieved      PT SHORT TERM GOAL #2   Title  The patient will improve Berg balance score from 44/56 to > or equal to 48/56 to demo dec'ing risk for falls.    Baseline  02/04/18 52/56    Time  4    Period  Weeks    Status  Achieved      PT SHORT TERM GOAL #3   Title  The patient will perform 360 degree turn with < or equal to 12 steps (baseline is 18 steps).    Baseline  02/04/18 8 steps    Time  4     Period  Weeks    Status  Achieved      PT SHORT TERM GOAL #4   Title  The patient will be  further assessed on FGA and goal to follow.    Baseline  02/01/18 13/30    Time  4    Period  Weeks    Status  Achieved        PT Long Term Goals - 02/04/18 1854      PT LONG TERM GOAL #1   Title  The patient will be indep with progression of HEP.  (LTG target date 03/09/2018)    Time  8    Period  Weeks      PT LONG TERM GOAL #2   Title  The patient will improve 5 time sit<>stand to < or equal to 15 seconds (baseline 19.84 seconds).    Time  8    Period  Weeks      PT LONG TERM GOAL #3   Title  The patient will be further assessed on FGA and LTG to follow. 02/04/18 Patient will improve FGA to >=17/30 (4 pts being [place] for PD)    Time  8    Period  Weeks    Status  Revised      PT LONG TERM GOAL #4   Title  The patient will perform 360 degree turn with < or equal to 8 steps (baseline is 18 steps). 02/04/18 8 steps (assessed at STGs)    Time  8    Period  Weeks    Status  Achieved            Plan - 02/22/18 1212    Clinical Impression Statement  Pt overall with improved movement and less pain today (pt missed session last week due to severe back and hip pain, and reports he is to see Dr. Ellene Route on 03/18/18).  Skilled PT session focused on dynamic gait and balance activities, especially in anterior/posterior direction (avoided lateral stepping activities today, as that has seemed to aggravate his hip pain in the past); also focused on functional strengthening exercises from elevated mat height.  Pt will continue to benefit from skilled PT to address balance and gait activities, particularly movement patterns that patient can use in his daily activities.    Rehab Potential  Good    PT Frequency  2x / week    PT Duration  8 weeks    PT Treatment/Interventions  ADLs/Self Care Home Management;Therapeutic exercise;Balance training;Neuromuscular re-education;Gait training;Therapeutic  activities;Patient/family education;Stair training;DME Instruction;Manual techniques    PT Next Visit Plan  Review HEP additions from 02/22/18 visit; continue to work on postural strengthening, turning, backwards walking (may try some of today's standing activities on compliant surfaces?)    Recommended Other Services  Pt reports he is seeing Dr. Carles Collet on 02/25/18    Consulted and Agree with Plan of Care  Patient       Patient will benefit from skilled therapeutic intervention in order to improve the following deficits and impairments:  Abnormal gait, Decreased endurance, Decreased activity tolerance, Decreased balance, Postural dysfunction  Visit Diagnosis: Unsteadiness on feet  Other abnormalities of gait and mobility     Problem List Patient Active Problem List   Diagnosis Date Noted  . Parkinson's disease ([place]) 02/03/2018  . Community acquired pneumonia of left lower lobe of lung ([place]) 12/23/2017  . Sepsis ([place]) 12/23/2017  . Morbid obesity due to excess calories ([place]) 08/23/2016  . OSA on CPAP 08/23/2016  . Dyspnea on exertion 08/22/2016  . Umbilical hernia 62/26/3335  . Persistent atrial fibrillation ([place])   . Encounter for therapeutic drug monitoring 08/12/2013  . Long  term current use of anticoagulant 07/30/2010  . COLONIC POLYPS 06/03/2010  . 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 GERD 06/03/2010  . HIATAL HERNIA 06/03/2010  . BENIGN PROSTATIC HYPERTROPHY, WITH OBSTRUCTION 06/03/2010  . PSORIASIS 06/03/2010    MARRIOTT,AMY W. 02/22/2018, 12:17 PM  Frazier Butt., PT   [facility] [address] [place], [place], [address] Phone: [phone]   Fax:  [phone]  Name: MOSTYN VARNELL MRN: [id number] Date of Birth: 1946-06-03

## 2018-02-22 NOTE — Therapy (Signed)
[facility] [address] [place], [place], [address] Phone: [phone]   Fax:  [phone]  Speech Language Pathology Treatment  Patient Details  Name: Stanley Wright MRN: [id number] Date of Birth: 06-26-1945 Referring Provider: Neldon Newport., MD   Encounter Date: 02/22/2018  End of Session - 02/22/18 1100    Visit Number  2    Number of Visits  17    Date for SLP Re-Evaluation  04/30/18    SLP Start Time  0846    SLP Stop Time   0927    SLP Time Calculation (min)  41 min    Activity Tolerance  Patient tolerated treatment well       Past Medical History:  Diagnosis Date  . Arthritis   . BENIGN PROSTATIC HYPERTROPHY, WITH OBSTRUCTION   . Cancer (HCC)    skin, basal, squamous  . COLONIC POLYPS   . Diverticulitis   . DVT (deep venous thrombosis) ([place]) 2000  . Dysrhythmia    Hx Afib- 2017  . Factor 5 Leiden mutation, heterozygous ([place])   . GERD (gastroesophageal reflux disease)    not on medication  . HIATAL HERNIA   . ITP (idiopathic thrombocytopenic purpura) 2003  . Long term current use of anticoagulant   . Parkinson's disease ([place]) 02/03/2018  . PSORIASIS   . Pulmonary embolus ([place]) 2000  . Shortness of breath dyspnea    at times - Talking alot  . Sleep apnea     Past Surgical History:  Procedure Laterality Date  . CARDIOVERSION N/A 11/22/2015   Procedure: CARDIOVERSION;  Surgeon: Sanda Klein, MD;  [facility];  Service: Cardiovascular;  Laterality: N/A;  . COLONOSCOPY    . HERNIA REPAIR Left    Inguinal- x2 . mesh x1  . INSERTION OF MESH N/A 02/25/2016   Procedure: INSERTION OF MESH;  Surgeon: Rolm Bookbinder, MD;  Location: [facility];  Service: General;  Laterality: N/A;  . LUNG REMOVAL, PARTIAL Right 2004  . PROSTATE SURGERY    . UMBILICAL HERNIA REPAIR N/A 02/25/2016   Procedure: LAPAROSCOPIC UMBILICAL HERNIA REPAIR;  Surgeon: Rolm Bookbinder, MD;  Location: [facility];  Service:  General;  Laterality: N/A;    There were no vitals filed for this visit.  Subjective Assessment - 02/22/18 0847    Subjective  "I need it" (re: speech therapy"    Currently in Pain?  No/denies            ADULT SLP TREATMENT - 02/22/18 0846      General Information   Behavior/Cognition  Alert;Cooperative      Treatment Provided   Treatment provided  Cognitive-Linquistic      Pain Assessment   Pain Assessment  No/denies pain      Cognitive-Linquistic Treatment   Treatment focused on  Dysarthria;Voice    Skilled Treatment  Pt entered treatment room with conversation in low to mid 60s dB intially. Pt told SLP he has been practicing loud /a/. Initial loud /a/ in upper 80s dB. With verbal cue x1 for effort level, subsequent productions average 92dB at 30 cm; pt using abdominal breathing without cues. In word and phrase level tasks, pt required mod A initially for increased intensity, and slow rate to allow more frequent breaths. Averaged low 70s dB in these tasks. In phrase and sentence level tasks, when SLP told pt to "shout," effort improved significantly and pt automatically slowed rate and breath support improved; intensity was 74 dB on average. SLP  provided auditory feedback for pt who agreed his voice sounded more like him when he feels he is shouting. SLP educated re: sensory changes with PD and role this plays in pt's voice. Pt reported he wasn't heard initially when he went through the drive-through this morning. SLP gave pt phrase and short sentence level tasks for home practice, as well as his frequent drive-through orders.       Assessment / Recommendations / Plan   Plan  Continue with current plan of care      Progression Toward Goals   Progression toward goals  Progressing toward goals       SLP Education - 02/22/18 1100    Education Details  sensory changes with PD, think "shout"    Person(s) Educated  Patient    Methods  Explanation;Demonstration;Verbal cues     Comprehension  Verbalized understanding;Returned demonstration;Need further instruction;Verbal cues required       SLP Short Term Goals - 02/22/18 1102      SLP SHORT TERM GOAL #1   Title  pt will produce loud /a/ at average low 90s dB over 4 sessions    Time  4    Period  Weeks    Status  On-going      SLP SHORT TERM GOAL #2   Title  pt will generate 18/20 sentences with WNL average volume over 3 sessions    Time  4    Period  Weeks    Status  On-going      SLP SHORT TERM GOAL #3   Title  pt will use abdominal breathing at rest 85% success over 2 sessions    Time  4    Period  Weeks    Status  On-going      SLP SHORT TERM GOAL #4   Title  In 7 minutes simple conversation pt will achieve average speech volume of low 70s dB over two sessions    Time  4    Period  Weeks    Status  On-going       SLP Long Term Goals - 02/22/18 1102      SLP LONG TERM GOAL #1   Title  Pt will generate loud /a/ with average of low 90s dB over 6 sessions    Time  8    Period  Weeks    Status  On-going      SLP LONG TERM GOAL #2   Title  pt will use abdominal breathing 70% of the time in simple-mod complex conversation over three sessions    Time  8    Period  Weeks    Status  On-going      SLP LONG TERM GOAL #3   Title  In 10 minutes simple-mod complex conversation pt will achieve average speech volume of low 70s dB over three sessions    Time  8    Period  Weeks    Status  On-going       Plan - 02/22/18 1101    Clinical Impression Statement  Pt presents today with lower than normal voice volume average mid 60s db , along with rare short rushes of speech, which in conjunction degraded speech intelligibility to 95%. However after loud /a/ production SLP told pt to focus on the effort necessary to produce loud /a/ and pt's vocalvintensity improved to low-mid 70s dB in phrase and sentence level tasks. He would benefit from skilled ST focusing on pt's goal of improving commnicative  ability with family and in the community.    Speech Therapy Frequency  2x / week    Treatment/Interventions  SLP instruction and feedback;Compensatory strategies;Patient/family education;Internal/external aids;Functional tasks;Cueing hierarchy;Environmental controls    Potential to Achieve Goals  Good    SLP Home Exercise Plan  provided    Consulted and Agree with Plan of Care  Patient       Patient will benefit from skilled therapeutic intervention in order to improve the following deficits and impairments:   Dysarthria and anarthria    Problem List Patient Active Problem List   Diagnosis Date Noted  . Parkinson's disease ([place]) 02/03/2018  . Community acquired pneumonia of left lower lobe of lung ([place]) 12/23/2017  . Sepsis ([facility]) 12/23/2017  . Morbid obesity due to excess calories ([place]) 08/23/2016  . OSA on CPAP 08/23/2016  . Dyspnea on exertion 08/22/2016  . Umbilical hernia 44/96/7591  . Persistent atrial fibrillation ([place])   . Encounter for therapeutic drug monitoring 08/12/2013  . Long term current use of anticoagulant 07/30/2010  . COLONIC POLYPS 06/03/2010  . 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 GERD 06/03/2010  . HIATAL HERNIA 06/03/2010  . BENIGN PROSTATIC HYPERTROPHY, WITH OBSTRUCTION 06/03/2010  . PSORIASIS 06/03/2010   Deneise Lever, [place], [place] Speech-Language Pathologist  Aliene Altes 02/22/2018, 11:03 AM  [facility] [address] [place], [place], [address] Phone: [phone]   Fax:  [phone]   Name: Stanley Wright MRN: [id number] Date of Birth: Feb 17, 1946

## 2018-02-22 NOTE — Patient Instructions (Signed)
Keep practicing your loud "ahh" 5 times, twice a day.  After your loud "ahh", read out LOUD tasks given by your therapist.  Try these phrases, too. You can add some more of your own. Try to think of phrases you say every day. Think SHOUT!  1. Good morning!  2. [place] and egg biscuit  3. And a regular iced tea, please.  4. A sausage, egg and cheese biscuit.  5. And a medium iced tea.

## 2018-02-22 NOTE — Progress Notes (Signed)
Stanley Wright was seen today in the movement disorders clinic for neurologic consultation at the request of Scifres, Earlie Server, [place].  The consultation is for the evaluation of PD.  Pt previously seen by Dr. Jannifer Franklin but only seen one time for consultation.  The records that were made available to me were reviewed.  Pt saw Dr. Jannifer Franklin on 02/03/18 for shuffling gait and balance change.  Patient reports that for symptom was speech change/loss of volume and that was about 3 years ago.  He noticed gait/balance change about 1 year ago.  Pt was dx with PD and started on carbidopa/levodopa 25/100 and worked to 1 po tid. He is not sure if the medication helps but just started 1 full pill 2 days ago   Specific Symptoms:  Tremor: No. Family hx of similar:  No. Voice: softly spoken x 3 years Sleep: trouble staying asleep  Vivid Dreams:  Yes.  , but started when started when he started CBG gummies  Acting out dreams:  No. Wet Pillows: No. Postural symptoms:  Yes.  , sounds like has start hesitation  Falls?  Yes.  , just one time 5-6 weeks ago; tripped over cord on shop vac  Bradykinesia symptoms: shuffling gait and difficulty getting out of a chair (shuffling is mostly at home with shoes off) Loss of smell:  Yes.   Loss of taste:  No. Urinary Incontinence:  No. Difficulty Swallowing:  No. Handwriting, micrographia: Yes.   Trouble with ADL's:  No.  Trouble buttoning clothing: No. Depression:  No. Memory changes:  No. Hallucinations:  No.  visual distortions: No. N/V:  No. Lightheaded:  No.  Syncope: No. Diplopia:  No. Dyskinesia:  No. Prior exposure to reglan/antipsychotics: No.  MRI of the brain with and without gadolinium was performed on December 09, 2017.  There was evidence of atrophy and mild to mod small vessel disease.  I personally reviewed the films.  PREVIOUS MEDICATIONS: Sinemet  ALLERGIES:   Allergies  Allergen Reactions  . Tylenol [Acetaminophen] Other (See Comments)    Pt  states he had a DNA test completed that stated he should never take tylenol.    CURRENT MEDICATIONS:  Outpatient Encounter Medications as of 02/25/2018  Medication Sig  . carbidopa-levodopa (SINEMET IR) 25-100 MG tablet Take 1 tablet by mouth 3 (three) times daily.  Marland Kitchen diltiazem (CARDIZEM CD) 360 MG 24 hr capsule Take 1 capsule (360 mg total) by mouth daily. (Patient taking differently: Take 300 mg by mouth daily. )  . warfarin (COUMADIN) 5 MG tablet TAKE 1 TABLET BY MOUTH ONCE DAILY OR  AS  DIRECTED  BY  COUMADIN  CLINIC  . [DISCONTINUED] albuterol (PROVENTIL HFA;VENTOLIN HFA) 108 (90 Base) MCG/ACT inhaler Inhale 1 puff into the lungs every 6 (six) hours as needed for wheezing or shortness of breath. (Patient not taking: Reported on 01/08/2018)  . [DISCONTINUED] azithromycin (ZITHROMAX) 500 MG tablet Take 1 tablet (500 mg total) by mouth daily. (Patient not taking: Reported on 01/08/2018)  . [DISCONTINUED] guaiFENesin (MUCINEX) 600 MG 12 hr tablet Take 1 tablet (600 mg total) by mouth 2 (two) times daily. (Patient not taking: Reported on 01/08/2018)  . [DISCONTINUED] metoprolol tartrate (LOPRESSOR) 25 MG tablet Take 1 tablet (25 mg total) by mouth 2 (two) times daily. (Patient not taking: Reported on 02/03/2018)  . [DISCONTINUED] traMADol (ULTRAM) 50 MG tablet Take 1 tablet (50 mg total) by mouth every 6 (six) hours as needed. (Patient not taking: Reported on 01/08/2018)   No  facility-administered encounter medications on file as of 02/25/2018.     PAST MEDICAL HISTORY:   Past Medical History:  Diagnosis Date  . Arthritis   . BENIGN PROSTATIC HYPERTROPHY, WITH OBSTRUCTION   . Cancer (HCC)    skin, basal, squamous  . COLONIC POLYPS   . Diverticulitis   . DVT (deep venous thrombosis) ([place]) 2000  . Dysrhythmia    Hx Afib- 2017  . Factor 5 Leiden mutation, heterozygous ([place])   . GERD (gastroesophageal reflux disease)    not on medication  . HIATAL HERNIA   . ITP (idiopathic thrombocytopenic  purpura) 2003  . Long term current use of anticoagulant   . Parkinson's disease ([place]) 02/03/2018  . PSORIASIS   . Pulmonary embolus ([place]) 2000  . Shortness of breath dyspnea    at times - Talking alot  . Sleep apnea     PAST SURGICAL HISTORY:   Past Surgical History:  Procedure Laterality Date  . CARDIOVERSION N/A 11/22/2015   Procedure: CARDIOVERSION;  Surgeon: Sanda Klein, MD;  [facility];  Service: Cardiovascular;  Laterality: N/A;  . COLONOSCOPY    . HERNIA REPAIR Left    Inguinal- x2 . mesh x1  . INSERTION OF MESH N/A 02/25/2016   Procedure: INSERTION OF MESH;  Surgeon: Rolm Bookbinder, MD;  Location: [facility];  Service: General;  Laterality: N/A;  . LUNG REMOVAL, PARTIAL Right 2004   was fungal and not cancerous  . PROSTATE SURGERY    . UMBILICAL HERNIA REPAIR N/A 02/25/2016   Procedure: LAPAROSCOPIC UMBILICAL HERNIA REPAIR;  Surgeon: Rolm Bookbinder, MD;  Location: [facility];  Service: General;  Laterality: N/A;    SOCIAL HISTORY:   Social History   Socioeconomic History  . Marital status: Divorced    Spouse name: Not on file  . Number of children: 2  . Years of education: Masters  . Highest education level: Not on file  Occupational History    Employer: RETIRED    Comment: police officer  Social Needs  . Financial resource strain: Not on file  . Food insecurity:    Worry: Not on file    Inability: Not on file  . Transportation needs:    Medical: Not on file    Non-medical: Not on file  Tobacco Use  . Smoking status: Former Smoker    Packs/day: 1.00    Last attempt to quit: 06/16/1985    Years since quitting: 32.7  . Smokeless tobacco: Never Used  . Tobacco comment: quit approx age 82-   Substance and Sexual Activity  . Alcohol use: No  . Drug use: No  . Sexual activity: Yes  Lifestyle  . Physical activity:    Days per week: Not on file    Minutes per session: Not on file  . Stress: Not on file  Relationships  . Social connections:    Talks  on phone: Not on file    Gets together: Not on file    Attends religious service: Not on file    Active member of club or organization: Not on file    Attends meetings of clubs or organizations: Not on file    Relationship status: Not on file  . Intimate partner violence:    Fear of current or ex partner: Not on file    Emotionally abused: Not on file    Physically abused: Not on file    Forced sexual activity: Not on file  Other Topics Concern  . Not on file  Social History Narrative   Lives alone   Caffeine use: Coffee daily   Right handed     FAMILY HISTORY:   Family Status  Relation Name Status  . Mother  Deceased  . Father  Deceased  . Sister  Alive  . Brother  Deceased  . Daughter  Alive  . Son  Alive  . MGM  Deceased  . MGF  Deceased  . PGM  Deceased  . PGF  Deceased    ROS:  Review of Systems  Constitutional: Negative.   HENT: Negative.   Eyes: Negative.   Respiratory: Positive for shortness of breath (chronic).   Cardiovascular: Negative.   Gastrointestinal: Negative.   Genitourinary: Negative.   Musculoskeletal: Positive for back pain and neck pain.  Skin: Negative.   Neurological: Negative.   Psychiatric/Behavioral: Negative.     PHYSICAL EXAMINATION:    VITALS:   Vitals:   02/25/18 0822  BP: 100/62  Pulse: 66  SpO2: 93%  Weight: 263 lb (119.3 kg)  Height: 6' (1.829 m)    GEN:  The patient appears stated age and is in NAD. HEENT:  Normocephalic, atraumatic.  The mucous membranes are moist. The superficial temporal arteries are without ropiness or tenderness. CV:  RRR Lungs:  CTAB Neck/HEME:  There are no carotid bruits bilaterally.  Neck is turned to the left and flexed with chin toward the chest  Neurological examination:  Orientation: The patient is alert and oriented x3. Fund of knowledge is appropriate.  Recent and remote memory are intact.  Attention and concentration are normal.    Able to name objects and repeat phrases. Cranial  nerves: There is good facial symmetry.  There is facial hypomimia.  There is R pseudoptosis.  Pupils are equal round and reactive to light bilaterally. Fundoscopic exam is attempted but the disc margins are not well visualized bilaterally. Extraocular muscles are intact. The visual fields are full to confrontational testing. The speech is fluent and clear. Soft palate rises symmetrically and there is no tongue deviation. Hearing is intact to conversational tone. Sensation: Sensation is intact to light and pinprick throughout (facial, trunk, extremities). Vibration is intact at the bilateral big toe. There is no extinction with double simultaneous stimulation. There is no sensory dermatomal level identified. Motor: Strength is 5/5 in the bilateral upper and lower extremities.   Shoulder shrug is equal and symmetric.  There is no pronator drift. Deep tendon reflexes: Deep tendon reflexes are 2/4 at the bilateral biceps, triceps, brachioradialis, patella and trace of the bilateral achilles. Plantar responses are downgoing bilaterally.  Movement examination: Tone: There is normal tone in the bilateral upper extremities.  The tone in the lower extremities is normal.  Abnormal movements: None Coordination:  There is mild decremation with RAM's, seen with most rapid alternating movements on the right and heel and toe taps bilaterally. Gait and Station: The patient has mild difficulty arising out of a deep-seated chair without the use of the hands.  He is able to do this once he sits on the very edge of the chair and leans forward.  He slightly drags the left leg with ambulation.  Stride length is good.  He has a negative pull test.     Chemistry      Component Value Date/Time   NA 141 12/26/2017 0347   K 3.9 12/26/2017 0347   CL 107 12/26/2017 0347   CO2 26 12/26/2017 0347   BUN 11 12/26/2017 0347   CREATININE 0.95 12/26/2017 0347  CREATININE 0.88 06/12/2015 1505      Component Value Date/Time    CALCIUM 8.5 (L) 12/26/2017 0347   ALKPHOS 89 12/26/2017 0347   AST 37 12/26/2017 0347   ALT 53 (H) 12/26/2017 0347   BILITOT 0.9 12/26/2017 0347     Lab Results  Component Value Date   WBC 4.7 12/26/2017   HGB 13.6 12/26/2017   HCT 40.8 12/26/2017   MCV 96.7 12/26/2017   PLT 118 (L) 12/26/2017   Lab Results  Component Value Date   TSH 2.88 10/08/2015     ASSESSMENT/PLAN:  1.  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  He certainly may.  It is somewhat concerning that he has cervical dystonia, as that can be seen early in some of the atypical states.  I did ask him to hold his medication when he comes in next visit (for 24 hours).  -We discussed the diagnosis as well as pathophysiology of the disease.  We discussed treatment options as well as prognostic indicators.  Patient education was provided.  -We discussed that it used to be thought that levodopa would increase risk of melanoma but now it is believed that Parkinsons itself likely increases risk of melanoma. he is to get regular skin checks.  -Greater than 50% of the 60 minute visit was spent in counseling answering questions and talking about what to expect now as well as in the future.  We talked about medication options as well as potential future surgical options.  We talked about safety in the home.  -For now, he will continue carbidopa/levodopa 25/100, 1 tablet 3 times per day.  -He will continue PT/OT/ST. We talked about the importance of safe, cardiovascular exercise in Parkinson's disease.  -We discussed community resources in the area including patient support groups and community exercise programs for PD and pt education was provided to the patient.  -Patient met with my social worker today.   2.  Cervical dystonia  -I talked to the patient about the nature and pathophysiology.  The patient is having trouble with  ADL's and with rotation of the head in daily life for driving.   We talked about treatments.  We talked about the value of botox.  The patient was educated on the botulinum toxin the black blox warning and given a copy of the botox patient medication guide.  The patient understands that this warning states that there have been reported cases of the Botox extending beyond the injection site and creating adverse effects, similar to those of botulism. This included loss of strength, trouble walking, hoarseness, trouble saying words clearly, loss of bladder control, trouble breathing, trouble swallowing, diplopia, blurry vision and ptosis. Most of the distant spread of Botox was happening in patients, primarily children, who received medication for spasticity or for cervical dystonia. The patient expressed understanding.  He wishes to hold on that for now  3.   LBP and R hip pain  -has appt to see Dr. Ellene Route  4.  Follow up is anticipated in the next few months, sooner should new neurologic issues arise.   Cc:  Scifres, Dorothy, [employer]

## 2018-02-24 ENCOUNTER — Ambulatory Visit: Payer: PPO | Admitting: Physical Therapy

## 2018-02-24 ENCOUNTER — Ambulatory Visit: Payer: PPO

## 2018-02-24 ENCOUNTER — Encounter: Payer: Self-pay | Admitting: Physical Therapy

## 2018-02-24 DIAGNOSIS — R2689 Other abnormalities of gait and mobility: Secondary | ICD-10-CM

## 2018-02-24 DIAGNOSIS — R2681 Unsteadiness on feet: Secondary | ICD-10-CM

## 2018-02-24 DIAGNOSIS — R471 Dysarthria and anarthria: Secondary | ICD-10-CM

## 2018-02-24 NOTE — Therapy (Signed)
[facility] [address] [place], [place], [address] Phone: [phone]   Fax:  [phone]  Physical Therapy Treatment  Patient Details  Name: Stanley Wright MRN: [id number] Date of Birth: August 28, 1945 Referring Provider: Maude Leriche, PA   Encounter Date: 02/24/2018  PT End of Session - 02/24/18 1014    Visit Number  9    Number of Visits  17    Date for PT Re-Evaluation  04/08/18    Authorization Type  [employer]    PT Start Time  1011    PT Stop Time  1055    PT Time Calculation (min)  44 min    Activity Tolerance  Patient tolerated treatment well    Behavior During Therapy  [facility] for tasks assessed/performed       Past Medical History:  Diagnosis Date  . Arthritis   . BENIGN PROSTATIC HYPERTROPHY, WITH OBSTRUCTION   . Cancer (HCC)    skin, basal, squamous  . COLONIC POLYPS   . Diverticulitis   . DVT (deep venous thrombosis) ([place]) 2000  . Dysrhythmia    Hx Afib- 2017  . Factor 5 Leiden mutation, heterozygous ([place])   . GERD (gastroesophageal reflux disease)    not on medication  . HIATAL HERNIA   . ITP (idiopathic thrombocytopenic purpura) 2003  . Long term current use of anticoagulant   . Parkinson's disease ([place]) 02/03/2018  . PSORIASIS   . Pulmonary embolus ([place]) 2000  . Shortness of breath dyspnea    at times - Talking alot  . Sleep apnea     Past Surgical History:  Procedure Laterality Date  . CARDIOVERSION N/A 11/22/2015   Procedure: CARDIOVERSION;  Surgeon: Sanda Klein, MD;  [facility];  Service: Cardiovascular;  Laterality: N/A;  . COLONOSCOPY    . HERNIA REPAIR Left    Inguinal- x2 . mesh x1  . INSERTION OF MESH N/A 02/25/2016   Procedure: INSERTION OF MESH;  Surgeon: Rolm Bookbinder, MD;  Location: [facility];  Service: General;  Laterality: N/A;  . LUNG REMOVAL, PARTIAL Right 2004  . PROSTATE SURGERY    . UMBILICAL HERNIA REPAIR N/A 02/25/2016   Procedure:  LAPAROSCOPIC UMBILICAL HERNIA REPAIR;  Surgeon: Rolm Bookbinder, MD;  Location: [facility];  Service: General;  Laterality: N/A;    There were no vitals filed for this visit.  Subjective Assessment - 02/24/18 1013    Subjective  felt well after last session. Sees Dr. Carles Collet tomorrow morning.     Pertinent History  sleep apnea, a-fib, factor V deficiency    Patient Stated Goals  "so I don't have the fear of falling everytime"; balance.    Currently in Pain?  Yes    Pain Score  3    with walking   Pain Location  Hip    Pain Orientation  Left    Pain Descriptors / Indicators  Aching;Sore    Pain Type  Chronic pain                            Balance Exercises - 02/24/18 1029      Balance Exercises: Standing   Standing Eyes Closed  Narrow base of support (BOS);Foam/compliant surface;3 reps;30 secs   no UE support   Step Ups  Forward;6 inch;UE support 1;UE support 2   foam - toe taps (in // bars)   Gait with Head Turns  Forward;Foam/compliant surface;4 reps   20'  Tandem Gait  Forward;Foam/compliant surface;4 reps   20'   Retro Gait  Foam/compliant surface;4 reps   20'   Sit to Stand Time  sit to stand from mat table x 10 with AirEx under feet - supervision    Other Standing Exercises  stagger stance on AirEx with A/P weight shifts x 20 each; fwd/bwd step over foam x 15 each LE; lateral step on/off foam x 10 each LE - slight increased L hip pain        PT Education - 02/24/18 1155    Education Details  Education on safety and impulsivity for reduced fall risk; discussion to continue HEP    Person(s) Educated  Patient    Methods  Explanation    Comprehension  Verbalized understanding       PT Short Term Goals - 02/04/18 1851      PT SHORT TERM GOAL #1   Title  The patient will return demo HEP for large amplitude movements, transitional movements for HEP.  (STG target date 02/07/2018)    Time  4    Period  Weeks    Status  Achieved      PT SHORT TERM GOAL  #2   Title  The patient will improve Berg balance score from 44/56 to > or equal to 48/56 to demo dec'ing risk for falls.    Baseline  02/04/18 52/56    Time  4    Period  Weeks    Status  Achieved      PT SHORT TERM GOAL #3   Title  The patient will perform 360 degree turn with < or equal to 12 steps (baseline is 18 steps).    Baseline  02/04/18 8 steps    Time  4    Period  Weeks    Status  Achieved      PT SHORT TERM GOAL #4   Title  The patient will be further assessed on FGA and goal to follow.    Baseline  02/01/18 13/30    Time  4    Period  Weeks    Status  Achieved        PT Long Term Goals - 02/04/18 1854      PT LONG TERM GOAL #1   Title  The patient will be indep with progression of HEP.  (LTG target date 03/09/2018)    Time  8    Period  Weeks      PT LONG TERM GOAL #2   Title  The patient will improve 5 time sit<>stand to < or equal to 15 seconds (baseline 19.84 seconds).    Time  8    Period  Weeks      PT LONG TERM GOAL #3   Title  The patient will be further assessed on FGA and LTG to follow. 02/04/18 Patient will improve FGA to >=17/30 (4 pts being [place] for PD)    Time  8    Period  Weeks    Status  Revised      PT LONG TERM GOAL #4   Title  The patient will perform 360 degree turn with < or equal to 8 steps (baseline is 18 steps). 02/04/18 8 steps (assessed at STGs)    Time  8    Period  Weeks    Status  Achieved            Plan - 02/24/18 1157    Clinical Impression Statement  PT session  today focusing on progression of balance/gait on compliant surfaces, weight shifting, as well as intermittent SL balance with stepping activities. Continued L hip pain limiting lateral movement in session. Patient relying on UE support as well as momentum throuhgout session with cueing throughout to slow movement. Patient also impulsive at times during session increasing his fall risk. Patient to continue to benefit from PT to progress safety, balance, gait, and  general LE strengthening.     Rehab Potential  Good    PT Frequency  2x / week    PT Duration  8 weeks    PT Treatment/Interventions  ADLs/Self Care Home Management;Therapeutic exercise;Balance training;Neuromuscular re-education;Gait training;Therapeutic activities;Patient/family education;Stair training;DME Instruction;Manual techniques    PT Next Visit Plan  continued work on compliant surfaces, turning    Consulted and Agree with Plan of Care  Patient       Patient will benefit from skilled therapeutic intervention in order to improve the following deficits and impairments:  Abnormal gait, Decreased endurance, Decreased activity tolerance, Decreased balance, Postural dysfunction  Visit Diagnosis: Unsteadiness on feet  Other abnormalities of gait and mobility     Problem List Patient Active Problem List   Diagnosis Date Noted  . Parkinson's disease ([place]) 02/03/2018  . Community acquired pneumonia of left lower lobe of lung ([place]) 12/23/2017  . Sepsis ([place]) 12/23/2017  . Morbid obesity due to excess calories ([place]) 08/23/2016  . OSA on CPAP 08/23/2016  . Dyspnea on exertion 08/22/2016  . Umbilical hernia 55/37/4827  . Persistent atrial fibrillation ([place])   . Encounter for therapeutic drug monitoring 08/12/2013  . Long term current use of anticoagulant 07/30/2010  . COLONIC POLYPS 06/03/2010  . 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 GERD 06/03/2010  . HIATAL HERNIA 06/03/2010  . BENIGN PROSTATIC HYPERTROPHY, WITH OBSTRUCTION 06/03/2010  . PSORIASIS 06/03/2010    Lanney Gins, PT, DPT 02/24/18 12:04 PM Pager: [phone]   [facility] [address] [place], [place], [address] Phone: [phone]   Fax:  [phone]  Name: JAMAIL CULLERS MRN: [id number] Date of Birth: 16-Aug-1945

## 2018-02-24 NOTE — Patient Instructions (Signed)
   Practice your abdominal breathing 15 minutes twice each day

## 2018-02-24 NOTE — Therapy (Signed)
[facility] [address] [place], [place], [address] Phone: [phone]   Fax:  [phone]  Speech Language Pathology Treatment  Patient Details  Name: Stanley Wright MRN: [id number] Date of Birth: 07-20-1945 Referring Provider: Neldon Newport., MD   Encounter Date: 02/24/2018  End of Session - 02/24/18 1014    Visit Number  3    Number of Visits  17    Date for SLP Re-Evaluation  04/30/18    SLP Start Time  0847    SLP Stop Time   0931    SLP Time Calculation (min)  44 min    Activity Tolerance  Patient tolerated treatment well       Past Medical History:  Diagnosis Date  . Arthritis   . BENIGN PROSTATIC HYPERTROPHY, WITH OBSTRUCTION   . Cancer (HCC)    skin, basal, squamous  . COLONIC POLYPS   . Diverticulitis   . DVT (deep venous thrombosis) ([place]) 2000  . Dysrhythmia    Hx Afib- 2017  . Factor 5 Leiden mutation, heterozygous ([place])   . GERD (gastroesophageal reflux disease)    not on medication  . HIATAL HERNIA   . ITP (idiopathic thrombocytopenic purpura) 2003  . Long term current use of anticoagulant   . Parkinson's disease ([place]) 02/03/2018  . PSORIASIS   . Pulmonary embolus ([place]) 2000  . Shortness of breath dyspnea    at times - Talking alot  . Sleep apnea     Past Surgical History:  Procedure Laterality Date  . CARDIOVERSION N/A 11/22/2015   Procedure: CARDIOVERSION;  Surgeon: Sanda Klein, MD;  [facility];  Service: Cardiovascular;  Laterality: N/A;  . COLONOSCOPY    . HERNIA REPAIR Left    Inguinal- x2 . mesh x1  . INSERTION OF MESH N/A 02/25/2016   Procedure: INSERTION OF MESH;  Surgeon: Rolm Bookbinder, MD;  Location: [facility];  Service: General;  Laterality: N/A;  . LUNG REMOVAL, PARTIAL Right 2004  . PROSTATE SURGERY    . UMBILICAL HERNIA REPAIR N/A 02/25/2016   Procedure: LAPAROSCOPIC UMBILICAL HERNIA REPAIR;  Surgeon: Rolm Bookbinder, MD;  Location: [facility];  Service:  General;  Laterality: N/A;    There were no vitals filed for this visit.  Subjective Assessment - 02/24/18 0854    Subjective  "That's why I'm here for." (to practice louder speech)            ADULT SLP TREATMENT - 02/24/18 0856      General Information   Behavior/Cognition  Alert;Cooperative;Pleasant mood      Treatment Provided   Treatment provided  Cognitive-Linquistic      Cognitive-Linquistic Treatment   Treatment focused on  Dysarthria;Voice    Skilled Treatment  Pt indicated to SLP that he feels he was louder at the drive thru yesterday. Told SLP that he gave a short talk yesterday and he ran out of air. SLP targeted abdominal breathing (AB) with pt and he completed 90% at rest with more shallow breaths. When cued by SLP pt produced deeper breaths with more chest breathing than solely AB. With loud /a/ pt produced "uh" until cued by SLP to open mouth/mandible and produced at average upper 80s dB. Pt produced stranied/strangled voice with sentence and word responese and so SLP shaped pt's responses with "hey" feeling push/strength from abdominal region - pt's productions improved however pt still req'd use of "hey" to assist muscle memory. SLP provided pt with word responses as homework  to reinforce abdominal breathing, louder voice without strain/strangle quality.       Assessment / Recommendations / Plan   Plan  Continue with current plan of care      Progression Toward Goals   Progression toward goals  Progressing toward goals       SLP Education - 02/24/18 1013    Education Details  abdominal push necessary and not vocal tightness for incr'd loudness    Person(s) Educated  Patient    Methods  Explanation;Demonstration;Verbal cues    Comprehension  Verbalized understanding;Returned demonstration;Verbal cues required;Need further instruction       SLP Short Term Goals - 02/24/18 1016      SLP SHORT TERM GOAL #1   Title  pt will produce loud /a/ at average low 90s  dB over 4 sessions    Time  4    Period  Weeks    Status  On-going      SLP SHORT TERM GOAL #2   Title  pt will generate 18/20 sentences with WNL average volume over 3 sessions    Time  4    Period  Weeks    Status  On-going      SLP SHORT TERM GOAL #3   Title  pt will use abdominal breathing at rest 85% success over 2 sessions    Time  4    Period  Weeks    Status  On-going      SLP SHORT TERM GOAL #4   Title  In 7 minutes simple conversation pt will achieve average speech volume of low 70s dB over two sessions    Time  4    Period  Weeks    Status  On-going       SLP Long Term Goals - 02/24/18 1016      SLP LONG TERM GOAL #1   Title  d    Time  8    Period  Weeks    Status  On-going      SLP LONG TERM GOAL #2   Title  pt will use abdominal breathing 70% of the time in simple-mod complex conversation over three sessions    Time  8    Period  Weeks    Status  On-going      SLP LONG TERM GOAL #3   Title  In 10 minutes simple-mod complex conversation pt will achieve average speech volume of low 70s dB over three sessions    Time  8    Period  Weeks    Status  On-going       Plan - 02/24/18 1014    Clinical Impression Statement  Pt presents today with cont'd suboptimal conversational voice volume with average mid 60s db. Pt also produced strained/strangled voice when attempting louder speech so SLP shaping this today to normalize vocal quality. He would benefit from skilled ST focusing on pt's goal of improving commnicative ability with family and in the community.    Speech Therapy Frequency  2x / week    Duration  --   8 weeks/17 session   Treatment/Interventions  SLP instruction and feedback;Compensatory strategies;Patient/family education;Internal/external aids;Functional tasks;Cueing hierarchy;Environmental controls    Potential to Achieve Goals  Good       Patient will benefit from skilled therapeutic intervention in order to improve the following deficits  and impairments:   Dysarthria and anarthria    Problem List Patient Active Problem List   Diagnosis Date Noted  . Parkinson's  disease ([place]) 02/03/2018  . Community acquired pneumonia of left lower lobe of lung ([place]) 12/23/2017  . Sepsis ([place]) 12/23/2017  . Morbid obesity due to excess calories ([place]) 08/23/2016  . OSA on CPAP 08/23/2016  . Dyspnea on exertion 08/22/2016  . Umbilical hernia 59/02/3111  . Persistent atrial fibrillation ([place])   . Encounter for therapeutic drug monitoring 08/12/2013  . Long term current use of anticoagulant 07/30/2010  . COLONIC POLYPS 06/03/2010  . 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 GERD 06/03/2010  . HIATAL HERNIA 06/03/2010  . BENIGN PROSTATIC HYPERTROPHY, WITH OBSTRUCTION 06/03/2010  . PSORIASIS 06/03/2010    Jaxton Casale ,MS, CCC-SLP  02/24/2018, 10:17 AM  [facility] [address] [place], [place], [address] Phone: [phone]   Fax:  [phone]   Name: Stanley Wright MRN: [id number] Date of Birth: January 14, 1946

## 2018-02-25 ENCOUNTER — Ambulatory Visit: Payer: PPO | Admitting: Neurology

## 2018-02-25 ENCOUNTER — Encounter: Payer: Self-pay | Admitting: Neurology

## 2018-02-25 ENCOUNTER — Encounter: Payer: Self-pay | Admitting: Psychology

## 2018-02-25 VITALS — BP 100/62 | HR 66 | Ht 72.0 in | Wt 263.0 lb

## 2018-02-25 DIAGNOSIS — M545 Low back pain: Secondary | ICD-10-CM

## 2018-02-25 DIAGNOSIS — M9902 Segmental and somatic dysfunction of thoracic region: Secondary | ICD-10-CM | POA: Diagnosis not present

## 2018-02-25 DIAGNOSIS — M9903 Segmental and somatic dysfunction of lumbar region: Secondary | ICD-10-CM | POA: Diagnosis not present

## 2018-02-25 DIAGNOSIS — G20 Parkinson's disease: Secondary | ICD-10-CM

## 2018-02-25 DIAGNOSIS — M546 Pain in thoracic spine: Secondary | ICD-10-CM | POA: Diagnosis not present

## 2018-02-25 DIAGNOSIS — M47814 Spondylosis without myelopathy or radiculopathy, thoracic region: Secondary | ICD-10-CM | POA: Diagnosis not present

## 2018-02-25 NOTE — Progress Notes (Signed)
I met with the patient while he was in the clinic today.  We talked about resources for Parkinson's disease in the community.  He currently is getting physical therapy at neuro rehab.  We talked about the importance of having an established exercise program when he transitions out of neuro rehab.  I shared the community exercise programs that are available.  In addition, I encouraged him to come to the power of her Parkinson's support group next week so he can start to get integrated into the community.  He brought a planner with him that so he organizes his medical appointments. 

## 2018-02-25 NOTE — Patient Instructions (Signed)
Hold your carbidopa/levodopa 25/100 24 hours before I next see you  Great to meet you!

## 2018-03-01 ENCOUNTER — Ambulatory Visit: Payer: PPO | Admitting: Physical Therapy

## 2018-03-01 ENCOUNTER — Ambulatory Visit: Payer: PPO | Admitting: Speech Pathology

## 2018-03-01 ENCOUNTER — Encounter: Payer: Self-pay | Admitting: Physical Therapy

## 2018-03-01 DIAGNOSIS — R2681 Unsteadiness on feet: Secondary | ICD-10-CM

## 2018-03-01 DIAGNOSIS — R471 Dysarthria and anarthria: Secondary | ICD-10-CM

## 2018-03-01 DIAGNOSIS — M9902 Segmental and somatic dysfunction of thoracic region: Secondary | ICD-10-CM | POA: Diagnosis not present

## 2018-03-01 DIAGNOSIS — M546 Pain in thoracic spine: Secondary | ICD-10-CM | POA: Diagnosis not present

## 2018-03-01 DIAGNOSIS — M47814 Spondylosis without myelopathy or radiculopathy, thoracic region: Secondary | ICD-10-CM | POA: Diagnosis not present

## 2018-03-01 DIAGNOSIS — R2689 Other abnormalities of gait and mobility: Secondary | ICD-10-CM

## 2018-03-01 DIAGNOSIS — M9903 Segmental and somatic dysfunction of lumbar region: Secondary | ICD-10-CM | POA: Diagnosis not present

## 2018-03-01 NOTE — Therapy (Signed)
[facility] [address] [place], [place], [address] Phone: [phone]   Fax:  [phone]  Speech Language Pathology Treatment  Patient Details  Name: Stanley Wright MRN: [id number] Date of Birth: 1946-01-10 Referring Provider: Neldon Newport., MD   Encounter Date: 03/01/2018  End of Session - 03/01/18 1752    Visit Number  4    Number of Visits  17    Date for SLP Re-Evaluation  04/30/18    SLP Start Time  6301    SLP Stop Time   1145    SLP Time Calculation (min)  43 min    Activity Tolerance  Patient tolerated treatment well       Past Medical History:  Diagnosis Date  . Arthritis   . BENIGN PROSTATIC HYPERTROPHY, WITH OBSTRUCTION   . Cancer (HCC)    skin, basal, squamous  . COLONIC POLYPS   . Diverticulitis   . DVT (deep venous thrombosis) ([place]) 2000  . Dysrhythmia    Hx Afib- 2017  . Factor 5 Leiden mutation, heterozygous ([place])   . GERD (gastroesophageal reflux disease)    not on medication  . HIATAL HERNIA   . ITP (idiopathic thrombocytopenic purpura) 2003  . Long term current use of anticoagulant   . Parkinson's disease ([place]) 02/03/2018  . PSORIASIS   . Pulmonary embolus ([place]) 2000  . Shortness of breath dyspnea    at times - Talking alot  . Sleep apnea     Past Surgical History:  Procedure Laterality Date  . CARDIOVERSION N/A 11/22/2015   Procedure: CARDIOVERSION;  Surgeon: Sanda Klein, MD;  [facility];  Service: Cardiovascular;  Laterality: N/A;  . COLONOSCOPY    . HERNIA REPAIR Left    Inguinal- x2 . mesh x1  . INSERTION OF MESH N/A 02/25/2016   Procedure: INSERTION OF MESH;  Surgeon: Rolm Bookbinder, MD;  Location: [facility];  Service: General;  Laterality: N/A;  . LUNG REMOVAL, PARTIAL Right 2004   was fungal and not cancerous  . PROSTATE SURGERY    . UMBILICAL HERNIA REPAIR N/A 02/25/2016   Procedure: LAPAROSCOPIC UMBILICAL HERNIA REPAIR;  Surgeon: Rolm Bookbinder, MD;   Location: [facility];  Service: General;  Laterality: N/A;    There were no vitals filed for this visit.  Subjective Assessment - 03/01/18 1101    Subjective  "Excuse me, hello!" (pt self-corrects loudness)    Currently in Pain?  No/denies            ADULT SLP TREATMENT - 03/01/18 1100      General Information   Behavior/Cognition  Alert;Cooperative;Pleasant mood      Treatment Provided   Treatment provided  Cognitive-Linquistic      Pain Assessment   Pain Assessment  No/denies pain      Cognitive-Linquistic Treatment   Treatment focused on  Dysarthria;Voice    Skilled Treatment  Pt's greeting to SLP was WNL, however subsequent conversation was sub-WNL, and pt noted to speak on residual capacity. SLP used loud /a/ to recalibrate pt's loudness in spontaneous speech. Pt required cues for respiration/phonation coordination to avoid strained vocal quality, and occasional cues to shape /a/ instead of "uh" or "o." Average was in upper 80s dB. Pt required mod cues in word and short sentence tasks for abdominal breathing and to avoid speaking on residual capacity to reduce strain.      Assessment / Recommendations / Plan   Plan  Continue with current plan of care  Progression Toward Goals   Progression toward goals  Progressing toward goals         SLP Short Term Goals - 03/01/18 1753      SLP SHORT TERM GOAL #1   Title  pt will produce loud /a/ at average low 90s dB over 4 sessions    Time  3    Period  Weeks    Status  On-going      SLP SHORT TERM GOAL #2   Title  pt will generate 18/20 sentences with WNL average volume over 3 sessions    Time  3    Period  Weeks    Status  On-going      SLP SHORT TERM GOAL #3   Title  pt will use abdominal breathing at rest 85% success over 2 sessions    Time  3    Period  Weeks    Status  On-going      SLP SHORT TERM GOAL #4   Title  In 7 minutes simple conversation pt will achieve average speech volume of low 70s dB over two  sessions    Time  3    Period  Weeks    Status  On-going       SLP Long Term Goals - 03/01/18 1754      SLP LONG TERM GOAL #1   Title  Pt will generate loud /a/ with average of low 90s dB over 6 sessions    Time  7    Period  Weeks    Status  On-going      SLP LONG TERM GOAL #2   Title  pt will use abdominal breathing 70% of the time in simple-mod complex conversation over three sessions    Time  7    Period  Weeks    Status  On-going      SLP LONG TERM GOAL #3   Title  In 10 minutes simple-mod complex conversation pt will achieve average speech volume of low 70s dB over three sessions    Time  7    Period  Weeks    Status  On-going       Plan - 03/01/18 1753    Clinical Impression Statement  Pt presents today with cont'd suboptimal conversational voice volume with average mid 60s db. Pt also produced strained/strangled voice when attempting louder speech so SLP shaping this today to normalize vocal quality. He would benefit from skilled ST focusing on pt's goal of improving commnicative ability with family and in the community.    Speech Therapy Frequency  2x / week    Treatment/Interventions  SLP instruction and feedback;Compensatory strategies;Patient/family education;Internal/external aids;Functional tasks;Cueing hierarchy;Environmental controls    Potential to Achieve Goals  Good       Patient will benefit from skilled therapeutic intervention in order to improve the following deficits and impairments:   Dysarthria and anarthria    Problem List Patient Active Problem List   Diagnosis Date Noted  . Parkinson's disease ([place]) 02/03/2018  . Community acquired pneumonia of left lower lobe of lung ([place]) 12/23/2017  . Sepsis ([place]) 12/23/2017  . Morbid obesity due to excess calories ([place]) 08/23/2016  . OSA on CPAP 08/23/2016  . Dyspnea on exertion 08/22/2016  . Umbilical hernia 95/62/1308  . Persistent atrial fibrillation ([place])   . Encounter for therapeutic drug  monitoring 08/12/2013  . Long term current use of anticoagulant 07/30/2010  . COLONIC POLYPS 06/03/2010  . 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  GERD 06/03/2010  . HIATAL HERNIA 06/03/2010  . BENIGN PROSTATIC HYPERTROPHY, WITH OBSTRUCTION 06/03/2010  . PSORIASIS 06/03/2010   Deneise Lever, [place], [place] 03/01/2018, 5:54 PM  [facility] [address] [place], [place], [address] Phone: [phone]   Fax:  [phone]   Name: Stanley Wright MRN: [id number] Date of Birth: 1946/02/06

## 2018-03-01 NOTE — Therapy (Signed)
[facility] [address] [place], [place], [address] Phone: [phone]   Fax:  [phone]  Physical Therapy Treatment  Patient Details  Name: Stanley Wright MRN: [id number] Date of Birth: 05/05/1946 Referring Provider: Dorothy Scifres, PA   Encounter Date: 03/01/2018  PT End of Session - 03/01/18 1503    Visit Number  10    Number of Visits  17    Date for PT Re-Evaluation  04/08/18    Authorization Type  [employer]    PT Start Time  1017    PT Stop Time  1059    PT Time Calculation (min)  42 min    Activity Tolerance  Patient tolerated treatment well    Behavior During Therapy  WFL for tasks assessed/performed       Past Medical History:  Diagnosis Date  . Arthritis   . BENIGN PROSTATIC HYPERTROPHY, WITH OBSTRUCTION   . Cancer (HCC)    skin, basal, squamous  . COLONIC POLYPS   . Diverticulitis   . DVT (deep venous thrombosis) (HCC) 2000  . Dysrhythmia    Hx Afib- 2017  . Factor 5 Leiden mutation, heterozygous (HCC)   . GERD (gastroesophageal reflux disease)    not on medication  . HIATAL HERNIA   . ITP (idiopathic thrombocytopenic purpura) 2003  . Long term current use of anticoagulant   . Parkinson's disease (HCC) 02/03/2018  . PSORIASIS   . Pulmonary embolus (HCC) 2000  . Shortness of breath dyspnea    at times - Talking alot  . Sleep apnea     Past Surgical History:  Procedure Laterality Date  . CARDIOVERSION N/A 11/22/2015   Procedure: CARDIOVERSION;  Surgeon: Mihai Croitoru, MD;  [facility];  Service: Cardiovascular;  Laterality: N/A;  . COLONOSCOPY    . HERNIA REPAIR Left    Inguinal- x2 . mesh x1  . INSERTION OF MESH N/A 02/25/2016   Procedure: INSERTION OF MESH;  Surgeon: Matthew Wakefield, MD;  Location: [facility];  Service: General;  Laterality: N/A;  . LUNG REMOVAL, PARTIAL Right 2004   was fungal and not cancerous  . PROSTATE SURGERY    . UMBILICAL HERNIA REPAIR  N/A 02/25/2016   Procedure: LAPAROSCOPIC UMBILICAL HERNIA REPAIR;  Surgeon: Matthew Wakefield, MD;  [facility];  Service: General;  Laterality: N/A;    There were no vitals filed for this visit.  Subjective Assessment - 03/01/18 1019    Subjective  A lot has happened since last time I saw you.  Felt good yesterday and I walked a mile.    Pertinent History  sleep apnea, a-fib, factor V deficiency    Patient Stated Goals  "so I don't have the fear of falling everytime"; balance.    Currently in Pain?  No/denies    Pain Score  --   just tender   Pain Location  Hip    Pain Orientation  Left    Pain Descriptors / Indicators  Tender                       [facility] Adult PT Treatment/Exercise - 03/01/18 0001      Transfers   Transfers  Sit to Stand;Stand to Sit    Sit to Stand  6: Modified independent (Device/Increase time);With upper extremity assist;From chair/3-in-1;Without upper extremity assist;From bed   No UE support from bed surface   Five time sit to stand comments   13.22   pushes   back against chair   Stand to Sit  6: Modified independent (Device/Increase time);With upper extremity assist;To chair/3-in-1;Without upper extremity assist;To bed;Uncontrolled descent    Number of Reps  Other sets (comment);Other reps (comment)   3 sets of 5 reps   Transfer Cueing  From chair, cued patient for forward scoot, increased forward lean, foot positioning, for decreased posterior lean/pushing through back of legs into chair.      Ambulation/Gait   Ambulation/Gait  Yes    Ambulation/Gait Assistance  6: Modified independent (Device/Increase time);5: Supervision    Ambulation Distance (Feet)  800 Feet   outdoors, 200 ft indoors   Assistive device  None    Gait Pattern  Step-through pattern;Decreased arm swing - left;Decreased dorsiflexion - left;Left foot flat;Decreased trunk rotation;Wide base of support;Poor foot clearance - left    Ambulation Surface   Indoor;Level;Outdoor;Unlevel    Gait velocity  8.2 sec = 4 ft/sec    Gait Comments  With outdoor gait, PT provides supervision and cues for increased step length, heelstrike, and upright posture.  Cues for negotiation of decline/incline of ramp.        [facility]) - 03/01/18 1034    PWR! exercises  Moves in sitting;Moves in standing    PWR! Up  x 10 reps supported at counter    PWR! Rock  x 10 reps each side with lateral reaching, then x 10 reps anterior/posterior weightshifting    PWR! Twist  x 10 reps each side    PWR Step  x 10 reps forward/diagonal step   UEs supported at counter   Comments  Standing PWR! Moves modified at [employer]! Sit to Stand  x 5 reps-forward hands to increase forward momentum, then upright posture to stand    PWR! Up  x 10    PWR! Rock  x 10 reps each side    PWR! Twist  x 5 reps each side    PWR! Step  marching in place x 10 reps each side    Comments  PWR! Moves in sitting-focus on posture, neck motion      Full 360 turns:  6-7 steps to R and to L.  Above PWR! Moves performed for posture, neck and trunk flexibility, weightshifting and initiation of stepping.      PT Short Term Goals - 02/04/18 1851      PT SHORT TERM GOAL #1   Title  The patient will return demo HEP for large amplitude movements, transitional movements for HEP.  (STG target date 02/07/2018)    Time  4    Period  Weeks    Status  Achieved      PT SHORT TERM GOAL #2   Title  The patient will improve Berg balance score from 44/56 to > or equal to 48/56 to demo dec'ing risk for falls.    Baseline  02/04/18 52/56    Time  4    Period  Weeks    Status  Achieved      PT SHORT TERM GOAL #3   Title  The patient will perform 360 degree turn with < or equal to 12 steps (baseline is 18 steps).    Baseline  02/04/18 8 steps    Time  4    Period  Weeks    Status  Achieved      PT SHORT TERM GOAL #4   Title  The patient will be further assessed on FGA and goal to follow.  Baseline  02/01/18 13/30    Time  4    Period  Weeks    Status  Achieved        PT Long Term Goals - 02/04/18 1854      PT LONG TERM GOAL #1   Title  The patient will be indep with progression of HEP.  (LTG target date 03/09/2018)    Time  8    Period  Weeks      PT LONG TERM GOAL #2   Title  The patient will improve 5 time sit<>stand to < or equal to 15 seconds (baseline 19.84 seconds).    Time  8    Period  Weeks      PT LONG TERM GOAL #3   Title  The patient will be further assessed on FGA and LTG to follow. 02/04/18 Patient will improve FGA to >=17/30 (4 pts being MDC for PD)    Time  8    Period  Weeks    Status  Revised      PT LONG TERM GOAL #4   Title  The patient will perform 360 degree turn with < or equal to 8 steps (baseline is 18 steps). 02/04/18 8 steps (assessed at STGs)    Time  8    Period  Weeks    Status  Achieved            Plan - 03/01/18 1504    Clinical Impression Statement  10th Visit Progress Note:  Pt seeen for 10 visits of PT since PT eval on 01/21/18.  Treatment sessions have been focused on posture, balance and funcitonal activities.  During the course of PT, pt has seen neurologist and has received diagnosis of Parkinson's disease.  He has started on Sinemet.  Pt has met all STGs and is on target towards LTGs.  He continues to beneift from skilled PT to address balance, transfers, posture, and gait towards LTGs and for improved functional mobility/decreased fall risk.    Rehab Potential  Good    PT Frequency  2x / week    PT Duration  8 weeks    PT Treatment/Interventions  ADLs/Self Care Home Management;Therapeutic exercise;Balance training;Neuromuscular re-education;Gait training;Therapeutic activities;Patient/family education;Stair training;DME Instruction;Manual techniques    PT Next Visit Plan  PWR! Moves in sitting, standing/modified quadruped and potentially add for HEP; compliant surfaces with gait and turns    Consulted and Agree with Plan  of Care  Patient       Patient will benefit from skilled therapeutic intervention in order to improve the following deficits and impairments:  Abnormal gait, Decreased endurance, Decreased activity tolerance, Decreased balance, Postural dysfunction  Visit Diagnosis: Unsteadiness on feet  Other abnormalities of gait and mobility     Problem List Patient Active Problem List   Diagnosis Date Noted  . Parkinson's disease (HCC) 02/03/2018  . Community acquired pneumonia of left lower lobe of lung (HCC) 12/23/2017  . Sepsis (HCC) 12/23/2017  . Morbid obesity due to excess calories (HCC) 08/23/2016  . OSA on CPAP 08/23/2016  . Dyspnea on exertion 08/22/2016  . Umbilical hernia 02/25/2016  . Persistent atrial fibrillation (HCC)   . Encounter for therapeutic drug monitoring 08/12/2013  . Long term current use of anticoagulant 07/30/2010  . COLONIC POLYPS 06/03/2010  . 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 GERD 06/03/2010  . HIATAL HERNIA 06/03/2010  . BENIGN PROSTATIC HYPERTROPHY, WITH OBSTRUCTION 06/03/2010  . PSORIASIS 06/03/2010    MARRIOTT,AMY W. 03/01/2018, 3:07 PM    [facility] [address] [place], [place], [address] Phone: [phone]   Fax:  [phone]  Name: JDEN WANT MRN: [id number] Date of Birth: Jun 08, 1946

## 2018-03-02 NOTE — Progress Notes (Signed)
HPI: FU permanent atrial fibrillation and history of factor V Leiden with 2 previous pulmonary emboli. He is on chronic Coumadin. Abdominal CT 2011 showed no aneurysm. Found to be in atrial fibrillation onpreviouselectrocardiogram. TSH 2.88. Patient placed on Cardizem for rate control. Had successful DCCV 11/22/15.However atrial fibrillation recurred. Holter monitor September 2017 showed atrial fibrillation with PVCs or aberrantly conducted beats. Cardizem increased to 300 mg daily.Echo 2/18 showed EF 45-50, mildly dilated aortic root and mild LAE. Nuclear study 2/18 showed no no ischemia or infarction.   Patient admitted July 2019 with pneumonia and E. coli bacteremia.  He was in atrial fibrillation and medications were advanced.  Since last seen,  he has mild dyspnea on exertion.  No orthopnea, PND, chest pain, palpitations or syncope.  Mild pedal edema which is chronic.  Current Outpatient Medications  Medication Sig Dispense Refill  . carbidopa-levodopa (SINEMET IR) 25-100 MG tablet Take 1 tablet by mouth 3 (three) times daily. 90 tablet 3  . diltiazem (CARDIZEM CD) 240 MG 24 hr capsule Take 240 mg by mouth daily.    Marland Kitchen warfarin (COUMADIN) 5 MG tablet TAKE 1 TABLET BY MOUTH ONCE DAILY OR  AS  DIRECTED  BY  COUMADIN  CLINIC 90 tablet 1  . diltiazem (CARDIZEM CD) 360 MG 24 hr capsule Take 1 capsule (360 mg total) by mouth daily. (Patient not taking: Reported on 03/10/2018) 30 capsule 3   No current facility-administered medications for this visit.      Past Medical History:  Diagnosis Date  . Arthritis   . BENIGN PROSTATIC HYPERTROPHY, WITH OBSTRUCTION   . Cancer (HCC)    skin, basal, squamous  . COLONIC POLYPS   . Diverticulitis   . DVT (deep venous thrombosis) ([place]) 2000  . Dysrhythmia    Hx Afib- 2017  . Factor 5 Leiden mutation, heterozygous ([place])   . GERD (gastroesophageal reflux disease)    not on medication  . HIATAL HERNIA   . ITP (idiopathic thrombocytopenic purpura)  2003  . Long term current use of anticoagulant   . Parkinson's disease ([place]) 02/03/2018  . PSORIASIS   . Pulmonary embolus ([place]) 2000  . Shortness of breath dyspnea    at times - Talking alot  . Sleep apnea     Past Surgical History:  Procedure Laterality Date  . CARDIOVERSION N/A 11/22/2015   Procedure: CARDIOVERSION;  Surgeon: Sanda Klein, MD;  [facility];  Service: Cardiovascular;  Laterality: N/A;  . COLONOSCOPY    . HERNIA REPAIR Left    Inguinal- x2 . mesh x1  . INSERTION OF MESH N/A 02/25/2016   Procedure: INSERTION OF MESH;  Surgeon: Rolm Bookbinder, MD;  Location: [facility];  Service: General;  Laterality: N/A;  . LUNG REMOVAL, PARTIAL Right 2004   was fungal and not cancerous  . PROSTATE SURGERY    . UMBILICAL HERNIA REPAIR N/A 02/25/2016   Procedure: LAPAROSCOPIC UMBILICAL HERNIA REPAIR;  Surgeon: Rolm Bookbinder, MD;  Location: [facility];  Service: General;  Laterality: N/A;    Social History   Socioeconomic History  . Marital status: Divorced    Spouse name: Not on file  . Number of children: 2  . Years of education: Masters  . Highest education level: Not on file  Occupational History    Employer: RETIRED    Comment: police officer  Social Needs  . Financial resource strain: Not on file  . Food insecurity:    Worry: Not on file  Inability: Not on file  . Transportation needs:    Medical: Not on file    Non-medical: Not on file  Tobacco Use  . Smoking status: Former Smoker    Packs/day: 1.00    Last attempt to quit: 06/16/1985    Years since quitting: 32.7  . Smokeless tobacco: Never Used  . Tobacco comment: quit approx age 69-   Substance and Sexual Activity  . Alcohol use: No  . Drug use: No  . Sexual activity: Yes  Lifestyle  . Physical activity:    Days per week: Not on file    Minutes per session: Not on file  . Stress: Not on file  Relationships  . Social connections:    Talks on phone: Not on file    Gets together: Not on file      Attends religious service: Not on file    Active member of club or organization: Not on file    Attends meetings of clubs or organizations: Not on file    Relationship status: Not on file  . Intimate partner violence:    Fear of current or ex partner: Not on file    Emotionally abused: Not on file    Physically abused: Not on file    Forced sexual activity: Not on file  Other Topics Concern  . Not on file  Social History Narrative   Lives alone   Caffeine use: Coffee daily   Right handed     Family History  Problem Relation Age of Onset  . Cancer Father   . Breast cancer Sister   . Heart disease Brother   . Cancer Maternal Grandmother   . Stroke Paternal Grandfather     ROS: no fevers or chills, productive cough, hemoptysis, dysphasia, odynophagia, melena, hematochezia, dysuria, hematuria, rash, seizure activity, orthopnea, PND, claudication. Remaining systems are negative.  Physical Exam: Well-developed obese in no acute distress.  Skin is warm and dry.  HEENT is normal.  Neck is supple.  Chest is clear to auscultation with normal expansion.  Cardiovascular exam is irregular  Abdominal exam nontender or distended. No masses palpated. Extremities show 1+ ankle edema. neuro grossly intact  A/P  1 permanent atrial fibrillation-plan to continue Cardizem for rate control but will increase from 240 to 300 mg daily.  Continue Coumadin.  2 cardiomyopathy-mildly reduced LV function on last echocardiogram.  Previous nuclear study showed no ischemia.  I will plan to repeat his echocardiogram to reassess LV function.  Question if mildly reduced LV function previously was related to tachycardia.  3 factor V Leiden-continue Coumadin.  He has had previous recurrent pulmonary emboli and will need lifelong anticoagulation.  4 chronic pedal edema-continue fluid restriction and low-sodium diet.  No change compared to previous.  5  preoperative evaluation prior to eyelid  surgery-patient will need to be off Coumadin.  Given hypercoagulable state and permanent atrial fibrillation I would recommend Lovenox bridge.  We will arrange with our Coumadin clinic.  Kirk Ruths, MD

## 2018-03-03 ENCOUNTER — Encounter: Payer: PPO | Admitting: Physical Therapy

## 2018-03-08 ENCOUNTER — Ambulatory Visit: Payer: PPO | Admitting: Physical Therapy

## 2018-03-08 ENCOUNTER — Ambulatory Visit: Payer: PPO

## 2018-03-08 DIAGNOSIS — R2681 Unsteadiness on feet: Secondary | ICD-10-CM | POA: Diagnosis not present

## 2018-03-08 DIAGNOSIS — R471 Dysarthria and anarthria: Secondary | ICD-10-CM

## 2018-03-08 DIAGNOSIS — M47814 Spondylosis without myelopathy or radiculopathy, thoracic region: Secondary | ICD-10-CM | POA: Diagnosis not present

## 2018-03-08 DIAGNOSIS — R2689 Other abnormalities of gait and mobility: Secondary | ICD-10-CM

## 2018-03-08 DIAGNOSIS — M546 Pain in thoracic spine: Secondary | ICD-10-CM | POA: Diagnosis not present

## 2018-03-08 DIAGNOSIS — M9903 Segmental and somatic dysfunction of lumbar region: Secondary | ICD-10-CM | POA: Diagnosis not present

## 2018-03-08 DIAGNOSIS — M9902 Segmental and somatic dysfunction of thoracic region: Secondary | ICD-10-CM | POA: Diagnosis not present

## 2018-03-08 NOTE — Therapy (Signed)
[name] [address] [name], [place], [address] Phone: [phone]   Fax:  [phone]  Speech Language Pathology Treatment  Patient Details  Name: Stanley Wright MRN: [id number] Date of Birth: 12/10/1945 Referring Provider: Neldon Wright., MD   Encounter Date: 03/08/2018  End of Session - 03/08/18 1544    Visit Number  5    Number of Visits  17    Date for SLP Re-Evaluation  04/30/18    SLP Start Time  1103    SLP Stop Time   1145    SLP Time Calculation (min)  42 min    Activity Tolerance  Patient tolerated treatment well       Past Medical History:  Diagnosis Date  . Arthritis   . BENIGN PROSTATIC HYPERTROPHY, WITH OBSTRUCTION   . Cancer (HCC)    skin, basal, squamous  . COLONIC POLYPS   . Diverticulitis   . DVT (deep venous thrombosis) ([place]) 2000  . Dysrhythmia    Hx Afib- 2017  . Factor 5 Leiden mutation, heterozygous ([place])   . GERD (gastroesophageal reflux disease)    not on medication  . HIATAL HERNIA   . ITP (idiopathic thrombocytopenic purpura) 2003  . Long term current use of anticoagulant   . Parkinson's disease ([place]) 02/03/2018  . PSORIASIS   . Pulmonary embolus ([place]) 2000  . Shortness of breath dyspnea    at times - Talking alot  . Sleep apnea     Past Surgical History:  Procedure Laterality Date  . CARDIOVERSION N/A 11/22/2015   Procedure: CARDIOVERSION;  Surgeon: Sanda Klein, MD;  [facility];  Service: Cardiovascular;  Laterality: N/A;  . COLONOSCOPY    . HERNIA REPAIR Left    Inguinal- x2 . mesh x1  . INSERTION OF MESH N/A 02/25/2016   Procedure: INSERTION OF MESH;  Surgeon: Rolm Bookbinder, MD;  Location: [facility];  Service: General;  Laterality: N/A;  . LUNG REMOVAL, PARTIAL Right 2004   was fungal and not cancerous  . PROSTATE SURGERY    . UMBILICAL HERNIA REPAIR N/A 02/25/2016   Procedure: LAPAROSCOPIC UMBILICAL HERNIA REPAIR;  Surgeon: Rolm Bookbinder, MD;   Location: [facility];  Service: General;  Laterality: N/A;    There were no vitals filed for this visit.  Subjective Assessment - 03/08/18 1109    Subjective  "Hello!" (louder voice)    Currently in Pain?  No/denies            ADULT SLP TREATMENT - 03/08/18 1109      General Information   Behavior/Cognition  Alert;Cooperative;Pleasant mood      Treatment Provided   Treatment provided  Cognitive-Linquistic      Cognitive-Linquistic Treatment   Treatment focused on  Dysarthria;Voice    Skilled Treatment  SLP used loud /a/ to recalibrate pt's conversational loudness. Pt's average in mid 90s dB. SLP then used structured tasks to habitualize pt's louder speech . Pt with strangled voice quality at word level, not improving at phrase/sentence level. SLP used long vowels /u/ and /o/ to attempt to shape WNL vocal quality instead of strangled/strained voice.  By session end, at word level, pt could hear difference between strained/strangled and WNL voice quality with approx 80% success. Pt to practice with WNL voice quality at word level.      Assessment / Recommendations / Plan   Plan  Continue with current plan of care      Progression Toward Goals  Progression toward goals  Progressing toward goals         SLP Short Term Goals - 03/08/18 1546      SLP SHORT TERM GOAL #1   Title  pt will produce loud /a/ at average low 90s dB over 4 sessions    Time  2    Period  Weeks    Status  On-going      SLP SHORT TERM GOAL #2   Title  pt will generate 18/20 sentences with WNL average volume over 3 sessions    Time  2    Period  Weeks    Status  On-going      SLP SHORT TERM GOAL #3   Title  pt will use abdominal breathing at rest 85% success over 2 sessions    Time  2    Period  Weeks    Status  On-going      SLP SHORT TERM GOAL #4   Title  In 7 minutes simple conversation pt will achieve average speech volume of low 70s dB over two sessions    Time  2    Period  Weeks     Status  On-going       SLP Long Term Goals - 03/08/18 1546      SLP LONG TERM GOAL #1   Title  Pt will generate loud /a/ with average of low 90s dB over 6 sessions    Time  6    Period  Weeks    Status  On-going      SLP LONG TERM GOAL #2   Title  pt will use abdominal breathing 70% of the time in simple-mod complex conversation over three sessions    Time  6    Period  Weeks    Status  On-going      SLP LONG TERM GOAL #3   Title  In 10 minutes simple-mod complex conversation pt will achieve average speech volume of low 70s dB over three sessions    Time  6    Period  Weeks    Status  On-going       Plan - 03/08/18 1544    Clinical Impression Statement  Pt presents today with cont'd suboptimal conversational voice volume with average mid 60s db. Pt also produced cont'd strained/strangled voice when attempting louder speech so SLP shaping this today to normalize vocal quality. He would benefit from skilled ST focusing on pt's goal of improving commnicative ability with family and in the community.    Speech Therapy Frequency  2x / week    Duration  --   8 weeks/17 sessions   Treatment/Interventions  SLP instruction and feedback;Compensatory strategies;Patient/family education;Internal/external aids;Functional tasks;Cueing hierarchy;Environmental controls    Potential to Achieve Goals  Good       Patient will benefit from skilled therapeutic intervention in order to improve the following deficits and impairments:   Dysarthria and anarthria    Problem List Patient Active Problem List   Diagnosis Date Noted  . Parkinson's disease ([place]) 02/03/2018  . Community acquired pneumonia of left lower lobe of lung ([place]) 12/23/2017  . Sepsis ([place]) 12/23/2017  . Morbid obesity due to excess calories ([place]) 08/23/2016  . OSA on CPAP 08/23/2016  . Dyspnea on exertion 08/22/2016  . Umbilical hernia 19/14/7829  . Persistent atrial fibrillation ([place])   . Encounter for therapeutic drug  monitoring 08/12/2013  . Long term current use of anticoagulant 07/30/2010  . COLONIC POLYPS 06/03/2010  .  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 GERD 06/03/2010  . HIATAL HERNIA 06/03/2010  . BENIGN PROSTATIC HYPERTROPHY, WITH OBSTRUCTION 06/03/2010  . PSORIASIS 06/03/2010    [facility] ,[place], CCC-SLP  03/08/2018, 3:46 PM  [facility] [address] [place], [place], [address] Phone: [phone]   Fax:  [phone]   Name: Stanley Wright MRN: [id number] Date of Birth: 04-Apr-1946

## 2018-03-09 NOTE — Therapy (Signed)
[facility] [address] [place], [place], [address] Phone: [phone]   Fax:  [phone]  Physical Therapy Treatment  Patient Details  Name: Stanley Wright MRN: [id number] Date of Birth: 1946-01-02 Referring Provider: Maude Leriche, PA   Encounter Date: 03/08/2018  PT End of Session - 03/09/18 1456    Visit Number  11    Number of Visits  17    Date for PT Re-Evaluation  04/08/18    Authorization Type  [employer]    PT Start Time  1018    PT Stop Time  1101    PT Time Calculation (min)  43 min    Activity Tolerance  Patient tolerated treatment well    Behavior During Therapy  [facility] for tasks assessed/performed       Past Medical History:  Diagnosis Date  . Arthritis   . BENIGN PROSTATIC HYPERTROPHY, WITH OBSTRUCTION   . Cancer (HCC)    skin, basal, squamous  . COLONIC POLYPS   . Diverticulitis   . DVT (deep venous thrombosis) ([place]) 2000  . Dysrhythmia    Hx Afib- 2017  . Factor 5 Leiden mutation, heterozygous ([place])   . GERD (gastroesophageal reflux disease)    not on medication  . HIATAL HERNIA   . ITP (idiopathic thrombocytopenic purpura) 2003  . Long term current use of anticoagulant   . Parkinson's disease ([place]) 02/03/2018  . PSORIASIS   . Pulmonary embolus ([place]) 2000  . Shortness of breath dyspnea    at times - Talking alot  . Sleep apnea     Past Surgical History:  Procedure Laterality Date  . CARDIOVERSION N/A 11/22/2015   Procedure: CARDIOVERSION;  Surgeon: Sanda Klein, MD;  [facility];  Service: Cardiovascular;  Laterality: N/A;  . COLONOSCOPY    . HERNIA REPAIR Left    Inguinal- x2 . mesh x1  . INSERTION OF MESH N/A 02/25/2016   Procedure: INSERTION OF MESH;  Surgeon: Rolm Bookbinder, MD;  Location: [place];  Service: General;  Laterality: N/A;  . LUNG REMOVAL, PARTIAL Right 2004   was fungal and not cancerous  . PROSTATE SURGERY    . UMBILICAL HERNIA REPAIR  N/A 02/25/2016   Procedure: LAPAROSCOPIC UMBILICAL HERNIA REPAIR;  Surgeon: Rolm Bookbinder, MD;  Location: [facility];  Service: General;  Laterality: N/A;    There were no vitals filed for this visit.  Subjective Assessment - 03/09/18 1446    Subjective  Have a few questions for you as we work today.  My hip is not bothering me at all today.    Pertinent History  sleep apnea, a-fib, factor V deficiency    Patient Stated Goals  "so I don't have the fear of falling everytime"; balance.    Currently in Pain?  No/denies                       [facility] Adult PT Treatment/Exercise - 03/09/18 0001      Self-Care   Self-Care  Other Self-Care Comments    Other Self-Care Comments   Answered pt's questions regarding medications and PD symptoms.  He currently feels that the addition of Sinemet has improved his movement patterns, but PT did discuss importance of documenting/talking to MD about any symptoms changes.  Made sure that patient is aware to avoid taking Sinemet directly with food.   Answered pt's questions regarding PD and exercise-including benefits (will talk more about optimal HEP and continued fitness  upon d/c from PT).  Pt also has questions regarding homeopathic remedies and CBD/hemp for PD-PT referred further questions on that to MD, as PT is unaware of research supporting-        [facility]) - 03/08/18 1033    PWR! exercises  Moves in sitting;Moves in [place];Moves in standing    PWR! Up  At parallel bars x 10 reps    PWR! Rock  Forward/back at parallel bars x [employer]! Twist  x 10 reps each side    PWR! Step  Forward/diagonal step at parallel bars x 10 reps    Comments  Modified quadruped PWR! Moves, performed at outside of parallel bars    PWR! Up  x 10 reps    PWR! Rock  x 10 reps rock and reach    [employer]! Up and Rock performed for posture, and weightshifting    PWR! Up  x 10    PWR! Rock  x 10 reps each side    PWR! Twist  x 10 reps each  side    PWR! Step  single limb step out and in x 10 reps    Comments  PWR! Moves in sitting for posture, weightshifting, trunk flexibility, and initial stepping          PT Education - 03/09/18 1455    Education Details  PD education-regarding medication, exercise, update to HEP    Person(s) Educated  Patient    Methods  Explanation;Demonstration;Handout    Comprehension  Verbalized understanding;Returned demonstration       PT Short Term Goals - 02/04/18 1851      PT SHORT TERM GOAL #1   Title  The patient will return demo HEP for large amplitude movements, transitional movements for HEP.  (STG target date 02/07/2018)    Time  4    Period  Weeks    Status  Achieved      PT SHORT TERM GOAL #2   Title  The patient will improve Berg balance score from 44/56 to > or equal to 48/56 to demo dec'ing risk for falls.    Baseline  02/04/18 52/56    Time  4    Period  Weeks    Status  Achieved      PT SHORT TERM GOAL #3   Title  The patient will perform 360 degree turn with < or equal to 12 steps (baseline is 18 steps).    Baseline  02/04/18 8 steps    Time  4    Period  Weeks    Status  Achieved      PT SHORT TERM GOAL #4   Title  The patient will be further assessed on FGA and goal to follow.    Baseline  02/01/18 13/30    Time  4    Period  Weeks    Status  Achieved        PT Long Term Goals - 02/04/18 1854      PT LONG TERM GOAL #1   Title  The patient will be indep with progression of HEP.  (LTG target date 03/09/2018)    Time  8    Period  Weeks      PT LONG TERM GOAL #2   Title  The patient will improve 5 time sit<>stand to < or equal to 15 seconds (baseline 19.84 seconds).    Time  8    Period  Weeks  PT LONG TERM GOAL #3   Title  The patient will be further assessed on FGA and LTG to follow. 02/04/18 Patient will improve FGA to >=17/30 (4 pts being [place] for PD)    Time  8    Period  Weeks    Status  Revised      PT LONG TERM GOAL #4   Title  The patient  will perform 360 degree turn with < or equal to 8 steps (baseline is 18 steps). 02/04/18 8 steps (assessed at STGs)    Time  8    Period  Weeks    Status  Achieved            Plan - 03/09/18 1458    Clinical Impression Statement  Updated pt's HEP to include PWR! Moves ([facility]) in sitting and modified quadruped, and standing positions for improved posture, weightshifting, trunk flexibility, and stepping.  Addressed patients PD-related questions, and will assess how he does with PWR! Moves to see if PWR! Moves community classes would be appropriate.    Rehab Potential  Good    PT Frequency  2x / week    PT Duration  8 weeks    PT Treatment/Interventions  ADLs/Self Care Home Management;Therapeutic exercise;Balance training;Neuromuscular re-education;Gait training;Therapeutic activities;Patient/family education;Stair training;DME Instruction;Manual techniques    PT Next Visit Plan  Review HEP; discuss walking program, optimal community fitnes upon d/c from PT; this is week 7 of 8-plan for d/c next week (likely)    Consulted and Agree with Plan of Care  Patient       Patient will benefit from skilled therapeutic intervention in order to improve the following deficits and impairments:  Abnormal gait, Decreased endurance, Decreased activity tolerance, Decreased balance, Postural dysfunction  Visit Diagnosis: Other abnormalities of gait and mobility  Unsteadiness on feet     Problem List Patient Active Problem List   Diagnosis Date Noted  . Parkinson's disease ([place]) 02/03/2018  . Community acquired pneumonia of left lower lobe of lung ([place]) 12/23/2017  . Sepsis ([place]) 12/23/2017  . Morbid obesity due to excess calories ([place]) 08/23/2016  . OSA on CPAP 08/23/2016  . Dyspnea on exertion 08/22/2016  . Umbilical hernia 28/36/6294  . Persistent atrial fibrillation ([place])   . Encounter for therapeutic drug monitoring 08/12/2013  . Long term current use of anticoagulant  07/30/2010  . COLONIC POLYPS 06/03/2010  . 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 GERD 06/03/2010  . HIATAL HERNIA 06/03/2010  . BENIGN PROSTATIC HYPERTROPHY, WITH OBSTRUCTION 06/03/2010  . PSORIASIS 06/03/2010    Aaditya Letizia W. 03/09/2018, 3:00 PM  Frazier Butt., PT   [facility] [address] [place], [place], [address] Phone: [phone]   Fax:  [phone]  Name: Stanley Wright MRN: [id number] Date of Birth: 1945-11-11

## 2018-03-10 ENCOUNTER — Encounter: Payer: Self-pay | Admitting: Physical Therapy

## 2018-03-10 ENCOUNTER — Ambulatory Visit: Payer: PPO | Admitting: Cardiology

## 2018-03-10 ENCOUNTER — Ambulatory Visit: Payer: PPO

## 2018-03-10 ENCOUNTER — Encounter: Payer: Self-pay | Admitting: Cardiology

## 2018-03-10 ENCOUNTER — Ambulatory Visit: Payer: PPO | Admitting: Physical Therapy

## 2018-03-10 VITALS — BP 128/66 | HR 86 | Ht 72.0 in | Wt 264.0 lb

## 2018-03-10 DIAGNOSIS — R471 Dysarthria and anarthria: Secondary | ICD-10-CM

## 2018-03-10 DIAGNOSIS — R2681 Unsteadiness on feet: Secondary | ICD-10-CM

## 2018-03-10 DIAGNOSIS — I482 Chronic atrial fibrillation: Secondary | ICD-10-CM

## 2018-03-10 DIAGNOSIS — R2689 Other abnormalities of gait and mobility: Secondary | ICD-10-CM

## 2018-03-10 DIAGNOSIS — I429 Cardiomyopathy, unspecified: Secondary | ICD-10-CM

## 2018-03-10 DIAGNOSIS — I4821 Permanent atrial fibrillation: Secondary | ICD-10-CM

## 2018-03-10 MED ORDER — DILTIAZEM HCL ER COATED BEADS 300 MG PO CP24: 300.0000 mg | ORAL_CAPSULE | Freq: Every day | ORAL | 3 refills | Status: DC

## 2018-03-10 NOTE — Patient Instructions (Signed)
Medication Instructions:   INCREASE DILTIAZEM TO 300 MG ONCE DAILY  Testing/Procedures:  Your physician has requested that you have an echocardiogram. Echocardiography is a painless test that uses sound waves to create images of your heart. It provides your doctor with information about the size and shape of your heart and how well your heart's chambers and valves are working. This procedure takes approximately one hour. There are no restrictions for this procedure.  AT THE [facility]  Follow-Up:  Your physician wants you to follow-up in: [place] will receive a reminder letter in the mail two months in advance. If you don't receive a letter, please call our office to schedule the follow-up appointment.   If you need a refill on your cardiac medications before your next appointment, please call your pharmacy.

## 2018-03-10 NOTE — Patient Instructions (Addendum)
Optimal Fitness Program after Therapy for People with Parkinson's Disease  1)  Therapy Home Exercise Program  -Do these Exercises DAILY as instructed by your therapist  -Big, deliberate effort with exercises  -These exercises are important to perform consistently, even when therapist has  finished, because these therapy exercises often address your specific  Parkinson's difficulties   2)  Walking  -  Work up to walking 3-5 times per week, 20-30 minutes per day  -This can be done at home, driveway, quiet street or an indoor track  -Focus should be on your Best posture, arm swing, step length for your best  walking pattern  3)  Aerobic Exercise  -Work up to 3-5 times per week, 30 minutes per day  -This can be stationary bike, seated stepper machine, elliptical machine  -Work up to 7-8/10 intensity during the exercise, at minimal to moderate     Resistance           (Exercise) Monday Tuesday Wednesday Thursday Friday Saturday Sunday   PWR! Moves sitting           PWR! Moves Standing           PWR! Moves at [employer]! Moves on your back           Standing balance and posture                      Walking           Recumbent bike

## 2018-03-10 NOTE — Patient Instructions (Signed)
  Please complete the assigned speech therapy homework prior to your next session and return it to the speech therapist at your next visit.  

## 2018-03-10 NOTE — Therapy (Signed)
[facility] [address] [place], [place], [address] Phone: [phone]   Fax:  [phone]  Speech Language Pathology Treatment  Patient Details  Name: Stanley Wright MRN: [id number] Date of Birth: Feb 28, 1946 Referring Provider: Neldon Newport., MD   Encounter Date: 03/10/2018  End of Session - 03/10/18 1652    Visit Number  6    Number of Visits  17    Date for SLP Re-Evaluation  04/30/18    SLP Start Time  1147    SLP Stop Time   1230    SLP Time Calculation (min)  43 min    Activity Tolerance  Patient tolerated treatment well       Past Medical History:  Diagnosis Date  . Arthritis   . BENIGN PROSTATIC HYPERTROPHY, WITH OBSTRUCTION   . Cancer (HCC)    skin, basal, squamous  . COLONIC POLYPS   . Diverticulitis   . DVT (deep venous thrombosis) ([place]) 2000  . Dysrhythmia    Hx Afib- 2017  . Factor 5 Leiden mutation, heterozygous ([place])   . GERD (gastroesophageal reflux disease)    not on medication  . HIATAL HERNIA   . ITP (idiopathic thrombocytopenic purpura) 2003  . Long term current use of anticoagulant   . Parkinson's disease ([place]) 02/03/2018  . PSORIASIS   . Pulmonary embolus ([place]) 2000  . Shortness of breath dyspnea    at times - Talking alot  . Sleep apnea     Past Surgical History:  Procedure Laterality Date  . CARDIOVERSION N/A 11/22/2015   Procedure: CARDIOVERSION;  Surgeon: Sanda Klein, MD;  [facility];  Service: Cardiovascular;  Laterality: N/A;  . COLONOSCOPY    . HERNIA REPAIR Left    Inguinal- x2 . mesh x1  . INSERTION OF MESH N/A 02/25/2016   Procedure: INSERTION OF MESH;  Surgeon: Rolm Bookbinder, MD;  Location: [facility];  Service: General;  Laterality: N/A;  . LUNG REMOVAL, PARTIAL Right 2004   was fungal and not cancerous  . PROSTATE SURGERY    . UMBILICAL HERNIA REPAIR N/A 02/25/2016   Procedure: LAPAROSCOPIC UMBILICAL HERNIA REPAIR;  Surgeon: Rolm Bookbinder, MD;   Location: [facility];  Service: General;  Laterality: N/A;    There were no vitals filed for this visit.  Subjective Assessment - 03/10/18 1148    Subjective  "I noticed people are asking me less to repeat myself."    Currently in Pain?  No/denies            ADULT SLP TREATMENT - 03/10/18 1150      General Information   Behavior/Cognition  Alert;Cooperative;Pleasant mood      Treatment Provided   Treatment provided  Cognitive-Linquistic      Cognitive-Linquistic Treatment   Treatment focused on  Dysarthria;Voice    Skilled Treatment  SLP again used loud /a/ to recalibrate pt's loudness in conversational speech. SLP targeted abdominal breathing and incr'd volume without strangled vocal quality. Pt incr'd awareness otday of WNL voicing and the need for a full breath prior to speaking. Pt provided sentence responses with initial 55% WNL voice quality improved to 85% when pt used abdominal breathing prior to providing his answer.      Assessment / Recommendations / Plan   Plan  Continue with current plan of care      Progression Toward Goals   Progression toward goals  Progressing toward goals       SLP Education - 03/10/18 1652  Education Details  you need to breath before you talk    Person(s) Educated  Patient    Methods  Explanation    Comprehension  Verbalized understanding;Need further instruction;Returned demonstration       SLP Short Term Goals - 03/10/18 1653      SLP SHORT TERM GOAL #1   Title  pt will produce loud /a/ at average low 90s dB over 4 sessions    Time  2    Period  Weeks    Status  On-going      SLP SHORT TERM GOAL #2   Title  pt will generate 18/20 sentences with WNL average volume over 3 sessions    Time  2    Period  Weeks    Status  On-going      SLP SHORT TERM GOAL #3   Title  pt will use abdominal breathing at rest 85% success over 2 sessions    Time  2    Period  Weeks    Status  On-going      SLP SHORT TERM GOAL #4   Title  In 7  minutes simple conversation pt will achieve average speech volume of low 70s dB over two sessions    Time  2    Period  Weeks    Status  On-going       SLP Long Term Goals - 03/10/18 1653      SLP LONG TERM GOAL #1   Title  Pt will generate loud /a/ with average of low 90s dB over 6 sessions    Time  6    Period  Weeks    Status  On-going      SLP LONG TERM GOAL #2   Title  pt will use abdominal breathing 70% of the time in simple-mod complex conversation over three sessions    Time  6    Period  Weeks    Status  On-going      SLP LONG TERM GOAL #3   Title  In 10 minutes simple-mod complex conversation pt will achieve average speech volume of low 70s dB over three sessions    Time  6    Period  Weeks    Status  On-going       Plan - 03/10/18 1652    Clinical Impression Statement  Pt presents today with cont'd suboptimal conversational voice volume with average mid 60s db. Pt strained/strangled voice occurred  less often today, and moreso when pt focused on abdominal breathing.  He would benefit from skilled ST focusing on pt's goal of improving commnicative ability with family and in the community.    Speech Therapy Frequency  2x / week    Duration  --   8 weeks/17 sessions   Treatment/Interventions  SLP instruction and feedback;Compensatory strategies;Patient/family education;Internal/external aids;Functional tasks;Cueing hierarchy;Environmental controls    Potential to Achieve Goals  Good       Patient will benefit from skilled therapeutic intervention in order to improve the following deficits and impairments:   Dysarthria and anarthria    Problem List Patient Active Problem List   Diagnosis Date Noted  . Parkinson's disease ([place]) 02/03/2018  . Community acquired pneumonia of left lower lobe of lung ([place]) 12/23/2017  . Sepsis ([place]) 12/23/2017  . Morbid obesity due to excess calories ([place]) 08/23/2016  . OSA on CPAP 08/23/2016  . Dyspnea on exertion 08/22/2016  .  Umbilical hernia 12/07/7626  . Persistent atrial fibrillation ([place])   .  Encounter for therapeutic drug monitoring 08/12/2013  . Long term current use of anticoagulant 07/30/2010  . COLONIC POLYPS 06/03/2010  . 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 GERD 06/03/2010  . HIATAL HERNIA 06/03/2010  . BENIGN PROSTATIC HYPERTROPHY, WITH OBSTRUCTION 06/03/2010  . PSORIASIS 06/03/2010    [facility] ,[place], CCC-SLP  03/10/2018, 4:54 PM  [facility] [address] [place], [place], [address] Phone: [phone]   Fax:  [phone]   Name: Stanley Wright MRN: [id number] Date of Birth: 05-26-1946

## 2018-03-11 DIAGNOSIS — M9902 Segmental and somatic dysfunction of thoracic region: Secondary | ICD-10-CM | POA: Diagnosis not present

## 2018-03-11 DIAGNOSIS — M47814 Spondylosis without myelopathy or radiculopathy, thoracic region: Secondary | ICD-10-CM | POA: Diagnosis not present

## 2018-03-11 DIAGNOSIS — M9903 Segmental and somatic dysfunction of lumbar region: Secondary | ICD-10-CM | POA: Diagnosis not present

## 2018-03-11 DIAGNOSIS — M546 Pain in thoracic spine: Secondary | ICD-10-CM | POA: Diagnosis not present

## 2018-03-11 NOTE — Therapy (Signed)
[facility] [address] [place], [place], [address] Phone: [phone]   Fax:  [phone]  Physical Therapy Treatment  Patient Details  Name: Stanley Wright MRN: [id number] Date of Birth: Oct 24, 1945 Referring Provider (PT): Maude Leriche, [place]   Encounter Date: 03/10/2018  PT End of Session - 03/11/18 1810    Visit Number  12    Number of Visits  17    Date for PT Re-Evaluation  04/08/18    Authorization Type  [employer]    PT Start Time  1104    PT Stop Time  1145    PT Time Calculation (min)  41 min    Activity Tolerance  Patient tolerated treatment well    Behavior During Therapy  [facility] for tasks assessed/performed       Past Medical History:  Diagnosis Date  . Arthritis   . BENIGN PROSTATIC HYPERTROPHY, WITH OBSTRUCTION   . Cancer (HCC)    skin, basal, squamous  . COLONIC POLYPS   . Diverticulitis   . DVT (deep venous thrombosis) ([place]) 2000  . Dysrhythmia    Hx Afib- 2017  . Factor 5 Leiden mutation, heterozygous ([place])   . GERD (gastroesophageal reflux disease)    not on medication  . HIATAL HERNIA   . ITP (idiopathic thrombocytopenic purpura) 2003  . Long term current use of anticoagulant   . Parkinson's disease ([place]) 02/03/2018  . PSORIASIS   . Pulmonary embolus ([place]) 2000  . Shortness of breath dyspnea    at times - Talking alot  . Sleep apnea     Past Surgical History:  Procedure Laterality Date  . CARDIOVERSION N/A 11/22/2015   Procedure: CARDIOVERSION;  Surgeon: Sanda Klein, MD;  [facility];  Service: Cardiovascular;  Laterality: N/A;  . COLONOSCOPY    . HERNIA REPAIR Left    Inguinal- x2 . mesh x1  . INSERTION OF MESH N/A 02/25/2016   Procedure: INSERTION OF MESH;  Surgeon: Rolm Bookbinder, MD;  Location: [facility];  Service: General;  Laterality: N/A;  . LUNG REMOVAL, PARTIAL Right 2004   was fungal and not cancerous  . PROSTATE SURGERY    . UMBILICAL HERNIA  REPAIR N/A 02/25/2016   Procedure: LAPAROSCOPIC UMBILICAL HERNIA REPAIR;  Surgeon: Rolm Bookbinder, MD;  Location: [place];  Service: General;  Laterality: N/A;    There were no vitals filed for this visit.  Subjective Assessment - 03/10/18 1106    Subjective  No changes since last visit.  Did none of the new exercises yet.    Pertinent History  sleep apnea, a-fib, factor V deficiency    Patient Stated Goals  "so I don't have the fear of falling everytime"; balance.    Currently in Pain?  No/denies                   Neuro Re-education:  Review of HEP from [facility]! Moves in sitting, modified quadruped and standing provided last visit.  Pt return demonstrates understanding of HEP.     [facility] Adult PT Treatment/Exercise - 03/11/18 0001      Self-Care   Self-Care  Other Self-Care Comments    Other Self-Care Comments   Discussed optimal fitness upon d/c from PT:  provided patient with info on rationale for performing PT exercises, walking, and aerobic activity regularly and provided exercise chart for tracking.  Provided information on PWR! Moves weekly exercise class.      Exercises   Exercises  Knee/Hip  Knee/Hip Exercises: Aerobic   Stepper  Seated Sci[employer] Stepper, Level 1.8, lower extremities/4 extremities, x 6 minutes, with RPM 80-100, cues for increased interval.   Discussed intensity of exercise:  pt rates intensity of aerobic exercise as 7/10             PT Education - 03/11/18 1810    Education Details  optimal PD exercises post d/c from PT; PWR! MOves exercise class, exercise chart    Person(s) Educated  Patient    Methods  Explanation;Demonstration;Handout    Comprehension  Returned demonstration;Verbalized understanding       PT Short Term Goals - 02/04/18 1851      PT SHORT TERM GOAL #1   Title  The patient will return demo HEP for large amplitude movements, transitional movements for HEP.  (STG target date 02/07/2018)    Time  4    Period  Weeks     Status  Achieved      PT SHORT TERM GOAL #2   Title  The patient will improve Berg balance score from 44/56 to > or equal to 48/56 to demo dec'ing risk for falls.    Baseline  02/04/18 52/56    Time  4    Period  Weeks    Status  Achieved      PT SHORT TERM GOAL #3   Title  The patient will perform 360 degree turn with < or equal to 12 steps (baseline is 18 steps).    Baseline  02/04/18 8 steps    Time  4    Period  Weeks    Status  Achieved      PT SHORT TERM GOAL #4   Title  The patient will be further assessed on FGA and goal to follow.    Baseline  02/01/18 13/30    Time  4    Period  Weeks    Status  Achieved        PT Long Term Goals - 02/04/18 1854      PT LONG TERM GOAL #1   Title  The patient will be indep with progression of HEP.  (LTG target date 03/09/2018)    Time  8    Period  Weeks      PT LONG TERM GOAL #2   Title  The patient will improve 5 time sit<>stand to < or equal to 15 seconds (baseline 19.84 seconds).    Time  8    Period  Weeks      PT LONG TERM GOAL #3   Title  The patient will be further assessed on FGA and LTG to follow. 02/04/18 Patient will improve FGA to >=17/30 (4 pts being [place] for PD)    Time  8    Period  Weeks    Status  Revised      PT LONG TERM GOAL #4   Title  The patient will perform 360 degree turn with < or equal to 8 steps (baseline is 18 steps). 02/04/18 8 steps (assessed at STGs)    Time  8    Period  Weeks    Status  Achieved            Plan - 03/11/18 1811    Clinical Impression Statement  Skilled PT session focused on education in optimal PD fitness upon d/c from PT and review of HEP from last visit.  Pt feels he will be ready for discharge from PT next visit.  Rehab Potential  Good    PT Frequency  2x / week    PT Duration  8 weeks    PT Treatment/Interventions  ADLs/Self Care Home Management;Therapeutic exercise;Balance training;Neuromuscular re-education;Gait training;Therapeutic activities;Patient/family  education;Stair training;DME Instruction;Manual techniques    PT Next Visit Plan  Check LTGs and plan for d/c next visit.    Consulted and Agree with Plan of Care  Patient       Patient will benefit from skilled therapeutic intervention in order to improve the following deficits and impairments:  Abnormal gait, Decreased endurance, Decreased activity tolerance, Decreased balance, Postural dysfunction  Visit Diagnosis: Unsteadiness on feet  Other abnormalities of gait and mobility     Problem List Patient Active Problem List   Diagnosis Date Noted  . Parkinson's disease ([place]) 02/03/2018  . Community acquired pneumonia of left lower lobe of lung ([place]) 12/23/2017  . Sepsis ([place]) 12/23/2017  . Morbid obesity due to excess calories ([place]) 08/23/2016  . OSA on CPAP 08/23/2016  . Dyspnea on exertion 08/22/2016  . Umbilical hernia 03/50/0938  . Persistent atrial fibrillation ([place])   . Encounter for therapeutic drug monitoring 08/12/2013  . Long term current use of anticoagulant 07/30/2010  . COLONIC POLYPS 06/03/2010  . 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 GERD 06/03/2010  . HIATAL HERNIA 06/03/2010  . BENIGN PROSTATIC HYPERTROPHY, WITH OBSTRUCTION 06/03/2010  . PSORIASIS 06/03/2010    MARRIOTT,AMY W. 03/11/2018, 6:14 PM  Frazier Butt., PT   [facility] [address] [place], [place], [address] Phone: [phone]   Fax:  [phone]  Name: Stanley Wright MRN: [id number] Date of Birth: 10/06/1945

## 2018-03-12 ENCOUNTER — Ambulatory Visit (HOSPITAL_BASED_OUTPATIENT_CLINIC_OR_DEPARTMENT_OTHER)
Admission: RE | Admit: 2018-03-12 | Discharge: 2018-03-12 | Disposition: A | Discharge status: Home / Self Care | Payer: PPO | Source: Ambulatory Visit | Attending: Cardiology | Admitting: Cardiology

## 2018-03-12 DIAGNOSIS — G20 Parkinson's disease: Secondary | ICD-10-CM | POA: Insufficient documentation

## 2018-03-12 DIAGNOSIS — I429 Cardiomyopathy, unspecified: Secondary | ICD-10-CM | POA: Insufficient documentation

## 2018-03-12 DIAGNOSIS — R0602 Shortness of breath: Secondary | ICD-10-CM | POA: Diagnosis not present

## 2018-03-12 DIAGNOSIS — I4891 Unspecified atrial fibrillation: Secondary | ICD-10-CM | POA: Insufficient documentation

## 2018-03-12 NOTE — Progress Notes (Signed)
  Echocardiogram 2D Echocardiogram has been performed.  Aaniya Sterba T Ryosuke Ericksen 03/12/2018, 10:26 AM

## 2018-03-15 ENCOUNTER — Ambulatory Visit: Payer: PPO

## 2018-03-15 ENCOUNTER — Encounter: Payer: Self-pay | Admitting: Physical Therapy

## 2018-03-15 ENCOUNTER — Ambulatory Visit: Payer: PPO | Admitting: Physical Therapy

## 2018-03-15 DIAGNOSIS — R471 Dysarthria and anarthria: Secondary | ICD-10-CM

## 2018-03-15 DIAGNOSIS — R2689 Other abnormalities of gait and mobility: Secondary | ICD-10-CM

## 2018-03-15 DIAGNOSIS — M9902 Segmental and somatic dysfunction of thoracic region: Secondary | ICD-10-CM | POA: Diagnosis not present

## 2018-03-15 DIAGNOSIS — M546 Pain in thoracic spine: Secondary | ICD-10-CM | POA: Diagnosis not present

## 2018-03-15 DIAGNOSIS — R2681 Unsteadiness on feet: Secondary | ICD-10-CM

## 2018-03-15 DIAGNOSIS — M9903 Segmental and somatic dysfunction of lumbar region: Secondary | ICD-10-CM | POA: Diagnosis not present

## 2018-03-15 DIAGNOSIS — M47814 Spondylosis without myelopathy or radiculopathy, thoracic region: Secondary | ICD-10-CM | POA: Diagnosis not present

## 2018-03-15 NOTE — Therapy (Signed)
[facility] [address] [place], [place], [address] Phone: [phone]   Fax:  [phone]  Speech Language Pathology Treatment  Patient Details  Name: Stanley Wright MRN: [id number] Date of Birth: 1946-05-27 Referring Provider (SLP): Neldon [place]., MD   Encounter Date: 03/15/2018  End of Session - 03/15/18 1211    Visit Number  7    Number of Visits  17    Date for SLP Re-Evaluation  04/30/18    SLP Start Time  41    SLP Stop Time   1135    SLP Time Calculation (min)  45 min    Activity Tolerance  Patient tolerated treatment well       Past Medical History:  Diagnosis Date  . Arthritis   . BENIGN PROSTATIC HYPERTROPHY, WITH OBSTRUCTION   . Cancer (HCC)    skin, basal, squamous  . COLONIC POLYPS   . Diverticulitis   . DVT (deep venous thrombosis) ([place]) 2000  . Dysrhythmia    Hx Afib- 2017  . Factor 5 Leiden mutation, heterozygous ([place])   . GERD (gastroesophageal reflux disease)    not on medication  . HIATAL HERNIA   . ITP (idiopathic thrombocytopenic purpura) 2003  . Long term current use of anticoagulant   . Parkinson's disease ([place]) 02/03/2018  . PSORIASIS   . Pulmonary embolus ([place]) 2000  . Shortness of breath dyspnea    at times - Talking alot  . Sleep apnea     Past Surgical History:  Procedure Laterality Date  . CARDIOVERSION N/A 11/22/2015   Procedure: CARDIOVERSION;  Surgeon: Sanda Klein, MD;  [facility];  Service: Cardiovascular;  Laterality: N/A;  . COLONOSCOPY    . HERNIA REPAIR Left    Inguinal- x2 . mesh x1  . INSERTION OF MESH N/A 02/25/2016   Procedure: INSERTION OF MESH;  Surgeon: Rolm Bookbinder, MD;  Location: [facility];  Service: General;  Laterality: N/A;  . LUNG REMOVAL, PARTIAL Right 2004   was fungal and not cancerous  . PROSTATE SURGERY    . UMBILICAL HERNIA REPAIR N/A 02/25/2016   Procedure: LAPAROSCOPIC UMBILICAL HERNIA REPAIR;  Surgeon: Rolm Bookbinder,  MD;  Location: [facility];  Service: General;  Laterality: N/A;    There were no vitals filed for this visit.  Subjective Assessment - 03/15/18 1056    Subjective  "Bad weekend, couldn't breathe, couldn't talk from my butt - not loud at all."    Currently in Pain?  No/denies            ADULT SLP TREATMENT - 03/15/18 1057      General Information   Behavior/Cognition  Alert;Cooperative;Pleasant mood      Treatment Provided   Treatment provided  Cognitive-Linquistic      Cognitive-Linquistic Treatment   Treatment focused on  Dysarthria;Voice    Skilled Treatment  SLP again used loud /a/ to recalibrate pt's loudness in conversational speech; average low 90s dB. SLP cont'd to target abdominal breathing and incr'd volume without strangled vocal quality in sentence responses. SLP used auditory feedback with digital recorder due to pt stating "I feel like I'm shouting." SLP practiced pt's drive thru order with him and told him to practice it, feeling like he was shouting. SLP also reiterated to pt that he will feel like he's shouting however it will sound as it did onthe digital recorder - WNL. Abdominal breathing (AB) improved pt's vocal quality again; pt successwith WNL volume improved with fading  cues to point of consistent use ofWNL volume at sentence level 85%, and breath prior to utterance 100%.       Assessment / Recommendations / Plan   Plan  Continue with current plan of care      Progression Toward Goals   Progression toward goals  Progressing toward goals       SLP Education - 03/15/18 1211    Education Details  LOUD - and loud only    Person(s) Educated  Patient    Methods  Explanation    Comprehension  Verbal cues required;Verbalized understanding;Returned demonstration;Need further instruction       SLP Short Term Goals - 03/15/18 1214      SLP SHORT TERM GOAL #1   Title  pt will produce loud /a/ at average low 90s dB over 4 sessions    Baseline  03-15-18    Time  1     Period  Weeks    Status  On-going      SLP SHORT TERM GOAL #2   Title  pt will generate 18/20 sentences with WNL average volume over 3 sessions    Baseline  03-15-18    Time  1    Period  Weeks    Status  On-going      SLP SHORT TERM GOAL #3   Title  pt will use abdominal breathing at rest 85% success over 2 sessions    Baseline  03-15-18    Time  1    Period  Weeks    Status  On-going      SLP SHORT TERM GOAL #4   Title  In 7 minutes simple conversation pt will achieve average speech volume of low 70s dB over two sessions    Time  1    Period  Weeks    Status  On-going       SLP Long Term Goals - 03/15/18 1216      SLP LONG TERM GOAL #1   Title  Pt will generate loud /a/ with average of low 90s dB over 6 sessions    Time  5    Period  Weeks    Status  On-going      SLP LONG TERM GOAL #2   Title  pt will use abdominal breathing 70% of the time in simple-mod complex conversation over three sessions    Time  5    Period  Weeks    Status  On-going      SLP LONG TERM GOAL #3   Title  In 10 minutes simple-mod complex conversation pt will achieve average speech volume of low 70s dB over three sessions    Time  5    Period  Weeks    Status  On-going       Plan - 03/15/18 1212    Clinical Impression Statement  Pt presents today with cont'd suboptimal conversational voice volume with average mid 60s db. Pt strained/strangled voice occurred  less often today with incr'd focus on abdominal breathing. SLP reiterated to pt to think about LOUDNESS only, not diction, not "Getting the words out" (as pt stated). He would benefit from skilled ST focusing on pt's goal of improving commnicative ability with family and in the community.    Speech Therapy Frequency  2x / week    Duration  --   8 weeks/17 sessions   Treatment/Interventions  SLP instruction and feedback;Compensatory strategies;Patient/family education;Internal/external aids;Functional tasks;Cueing hierarchy;Environmental  controls    Potential  to Achieve Goals  Good       Patient will benefit from skilled therapeutic intervention in order to improve the following deficits and impairments:   Dysarthria and anarthria    Problem List Patient Active Problem List   Diagnosis Date Noted  . Parkinson's disease ([place]) 02/03/2018  . Community acquired pneumonia of left lower lobe of lung ([place]) 12/23/2017  . Sepsis ([place]) 12/23/2017  . Morbid obesity due to excess calories ([place]) 08/23/2016  . OSA on CPAP 08/23/2016  . Dyspnea on exertion 08/22/2016  . Umbilical hernia 66/11/43  . Persistent atrial fibrillation ([place])   . Encounter for therapeutic drug monitoring 08/12/2013  . Long term current use of anticoagulant 07/30/2010  . COLONIC POLYPS 06/03/2010  . 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 GERD 06/03/2010  . HIATAL HERNIA 06/03/2010  . BENIGN PROSTATIC HYPERTROPHY, WITH OBSTRUCTION 06/03/2010  . PSORIASIS 06/03/2010    [facility] ,[place], [place]  03/15/2018, 12:17 PM  [facility] [address] [place], [place], [address] Phone: [phone]   Fax:  [phone]   Name: Stanley Wright MRN: [id number] Date of Birth: Nov 14, 1945

## 2018-03-15 NOTE — Therapy (Signed)
[facility] [address] [place], [place], [address] Phone: [phone]   Fax:  [phone]  Physical Therapy Treatment  Patient Details  Name: Stanley Wright MRN: [id number] Date of Birth: 10/11/1945 Referring Provider (PT): Maude Leriche, [place]   Encounter Date: 03/15/2018  PT End of Session - 03/15/18 1119    Visit Number  13    Number of Visits  17    Date for PT Re-Evaluation  04/08/18    Authorization Type  [employer]    PT Start Time  1020    PT Stop Time  1047   d/c visit-goals checked and session ended   PT Time Calculation (min)  27 min    Activity Tolerance  Patient tolerated treatment well    Behavior During Therapy  [facility] for tasks assessed/performed       Past Medical History:  Diagnosis Date  . Arthritis   . BENIGN PROSTATIC HYPERTROPHY, WITH OBSTRUCTION   . Cancer (HCC)    skin, basal, squamous  . COLONIC POLYPS   . Diverticulitis   . DVT (deep venous thrombosis) ([place]) 2000  . Dysrhythmia    Hx Afib- 2017  . Factor 5 Leiden mutation, heterozygous ([place])   . GERD (gastroesophageal reflux disease)    not on medication  . HIATAL HERNIA   . ITP (idiopathic thrombocytopenic purpura) 2003  . Long term current use of anticoagulant   . Parkinson's disease ([place]) 02/03/2018  . PSORIASIS   . Pulmonary embolus ([place]) 2000  . Shortness of breath dyspnea    at times - Talking alot  . Sleep apnea     Past Surgical History:  Procedure Laterality Date  . CARDIOVERSION N/A 11/22/2015   Procedure: CARDIOVERSION;  Surgeon: Sanda Klein, MD;  [facility];  Service: Cardiovascular;  Laterality: N/A;  . COLONOSCOPY    . HERNIA REPAIR Left    Inguinal- x2 . mesh x1  . INSERTION OF MESH N/A 02/25/2016   Procedure: INSERTION OF MESH;  Surgeon: Rolm Bookbinder, MD;  Location: [facility];  Service: General;  Laterality: N/A;  . LUNG REMOVAL, PARTIAL Right 2004   was fungal and not cancerous  .  PROSTATE SURGERY    . UMBILICAL HERNIA REPAIR N/A 02/25/2016   Procedure: LAPAROSCOPIC UMBILICAL HERNIA REPAIR;  Surgeon: Rolm Bookbinder, MD;  Location: [facility];  Service: General;  Laterality: N/A;    There were no vitals filed for this visit.  Subjective Assessment - 03/15/18 1022    Subjective  Had a good weekend-been some forgetfulness over the weekend.  Did mostly walking over the weekend.    Pertinent History  sleep apnea, a-fib, factor V deficiency    Patient Stated Goals  "so I don't have the fear of falling everytime"; balance.    Currently in Pain?  No/denies         [facility] PT Assessment - 03/15/18 1024      Functional Gait  Assessment   Gait assessed   Yes    Gait Level Surface  Walks 20 ft in less than 7 sec but greater than 5.5 sec, uses assistive device, slower speed, mild gait deviations, or deviates 6-10 in outside of the 12 in walkway width.   5.85   Change in Gait Speed  Able to smoothly change walking speed without loss of balance or gait deviation. Deviate no more than 6 in outside of the 12 in walkway width.    Gait with Horizontal Head Turns  Performs head turns smoothly with slight change in gait velocity (eg, minor disruption to smooth gait path), deviates 6-10 in outside 12 in walkway width, or uses an assistive device.    Gait with Vertical Head Turns  Performs task with slight change in gait velocity (eg, minor disruption to smooth gait path), deviates 6 - 10 in outside 12 in walkway width or uses assistive device    Gait and Pivot Turn  Pivot turns safely within 3 sec and stops quickly with no loss of balance.    Step Over Obstacle  Is able to step over one shoe box (4.5 in total height) without changing gait speed. No evidence of imbalance.    Gait with Narrow Base of Support  Is able to ambulate for 10 steps heel to toe with no staggering.    Gait with Eyes Closed  Walks 20 ft, uses assistive device, slower speed, mild gait deviations, deviates 6-10 in outside  12 in walkway width. Ambulates 20 ft in less than 9 sec but greater than 7 sec.    Ambulating Backwards  Walks 20 ft, slow speed, abnormal gait pattern, evidence for imbalance, deviates 10-15 in outside 12 in walkway width.   19.21   Steps  Alternating feet, must use rail.    Total Score  22                   [facility] Adult PT Treatment/Exercise - 03/15/18 1024      Transfers   Transfers  Sit to Stand;Stand to Sit    Sit to Stand  6: Modified independent (Device/Increase time);With upper extremity assist;From chair/3-in-1;Without upper extremity assist;From bed    Five time sit to stand comments   15.66   8.41 sec with UE support   Stand to Sit  6: Modified independent (Device/Increase time);With upper extremity assist;To chair/3-in-1;Without upper extremity assist;To bed;Uncontrolled descent      Ambulation/Gait   Ambulation/Gait  Yes    Ambulation/Gait Assistance  6: Modified independent (Device/Increase time);5: Supervision    Ambulation Distance (Feet)  300 Feet    Assistive device  None    Gait Pattern  Step-through pattern;Decreased arm swing - left;Decreased dorsiflexion - left;Left foot flat;Decreased trunk rotation;Wide base of support;Poor foot clearance - left    Ambulation Surface  Level;Indoor    Gait velocity  9.12 sec = 3.6 ft/sec    Gait Comments  Practiced turns:  pt turns 360 degrees R and L 2 reps each, 6-8 steps to complete full turn.      Standardized Balance Assessment   Standardized Balance Assessment  Timed Up and Go Test      Timed Up and Go Test   TUG  Normal TUG    Normal TUG (seconds)  11.84      Self-Care   Self-Care  Other Self-Care Comments    Other Self-Care Comments   Reviewed exercise information provided last visit-pt verb. understanding.  Pt verbalizes plan for joining [employer] exercise class in early November.  Discussed POC, progress towards goals and plans for d/c today.  He is agreeable to return PT eval in 6-9 months.              PT Education - 03/15/18 1118    Education Details  Progress towards goals, d/c from PT; return PT eval in 6-9 months    Person(s) Educated  Patient    Methods  Explanation    Comprehension  Verbalized understanding  PT Short Term Goals - 02/04/18 1851      PT SHORT TERM GOAL #1   Title  The patient will return demo HEP for large amplitude movements, transitional movements for HEP.  (STG target date 02/07/2018)    Time  4    Period  Weeks    Status  Achieved      PT SHORT TERM GOAL #2   Title  The patient will improve Berg balance score from 44/56 to > or equal to 48/56 to demo dec'ing risk for falls.    Baseline  02/04/18 52/56    Time  4    Period  Weeks    Status  Achieved      PT SHORT TERM GOAL #3   Title  The patient will perform 360 degree turn with < or equal to 12 steps (baseline is 18 steps).    Baseline  02/04/18 8 steps    Time  4    Period  Weeks    Status  Achieved      PT SHORT TERM GOAL #4   Title  The patient will be further assessed on FGA and goal to follow.    Baseline  02/01/18 13/30    Time  4    Period  Weeks    Status  Achieved        PT Long Term Goals - 03/15/18 1023      PT LONG TERM GOAL #1   Title  The patient will be indep with progression of HEP.  (LTG target date 03/09/2018)    Time  8    Period  Weeks    Status  Achieved      PT LONG TERM GOAL #2   Title  The patient will improve 5 time sit<>stand to < or equal to 15 seconds (baseline 19.84 seconds).    Baseline  13.22 sec 03/01/18, 15.66 sec 03/15/18    Time  8    Period  Weeks    Status  Partially Met      PT LONG TERM GOAL #3   Title  The patient will be further assessed on FGA and LTG to follow. 02/04/18 Patient will improve FGA to >=17/30 (4 pts being [place] for PD)    Baseline  22/30  03/15/18    Time  8    Period  Weeks    Status  Achieved      PT LONG TERM GOAL #4   Title  The patient will perform 360 degree turn with < or equal to 8 steps (baseline is  18 steps). 02/04/18 8 steps (assessed at STGs)    Time  8    Period  Weeks    Status  Achieved            Plan - 03/15/18 1120    Clinical Impression Statement  Pt has met LTG 1, 3, 4.  LTG 2 for 5 x sit<>stand partially met, with overall progress from initial 5x sit<>Stand score of >19 seconds.  Overall, pt demo improved functional mobility and decreased fall risk.  He is appropriate for d/c this visit.    Rehab Potential  Good    PT Frequency  2x / week    PT Duration  8 weeks    PT Treatment/Interventions  ADLs/Self Care Home Management;Therapeutic exercise;Balance training;Neuromuscular re-education;Gait training;Therapeutic activities;Patient/family education;Stair training;DME Instruction;Manual techniques    PT Next Visit Plan  Discharge PT this visit.  Plan for return PT eval in  6-9 months.    Consulted and Agree with Plan of Care  Patient       Patient will benefit from skilled therapeutic intervention in order to improve the following deficits and impairments:  Abnormal gait, Decreased endurance, Decreased activity tolerance, Decreased balance, Postural dysfunction  Visit Diagnosis: Unsteadiness on feet  Other abnormalities of gait and mobility     Problem List Patient Active Problem List   Diagnosis Date Noted  . Parkinson's disease ([place]) 02/03/2018  . Community acquired pneumonia of left lower lobe of lung ([place]) 12/23/2017  . Sepsis ([place]) 12/23/2017  . Morbid obesity due to excess calories ([place]) 08/23/2016  . OSA on CPAP 08/23/2016  . Dyspnea on exertion 08/22/2016  . Umbilical hernia 54/98/2641  . Persistent atrial fibrillation ([place])   . Encounter for therapeutic drug monitoring 08/12/2013  . Long term current use of anticoagulant 07/30/2010  . COLONIC POLYPS 06/03/2010  . 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 GERD 06/03/2010  . HIATAL HERNIA 06/03/2010  . BENIGN PROSTATIC HYPERTROPHY, WITH OBSTRUCTION 06/03/2010  . PSORIASIS 06/03/2010    Damonica Chopra  W. 03/15/2018, 11:22 AM  Frazier Butt PT   [facility] [address] [place], [place], [address] Phone: [phone]   Fax:  [phone]  Name: Stanley Wright MRN: [id number] Date of Birth: June 13, 1946   PHYSICAL THERAPY DISCHARGE SUMMARY  Visits from Start of Care: 13  Current functional level related to goals / functional outcomes: PT Long Term Goals - 03/15/18 1023      PT LONG TERM GOAL #1   Title  The patient will be indep with progression of HEP.  (LTG target date 03/09/2018)    Time  8    Period  Weeks    Status  Achieved      PT LONG TERM GOAL #2   Title  The patient will improve 5 time sit<>stand to < or equal to 15 seconds (baseline 19.84 seconds).    Baseline  13.22 sec 03/01/18, 15.66 sec 03/15/18    Time  8    Period  Weeks    Status  Partially Met      PT LONG TERM GOAL #3   Title  The patient will be further assessed on FGA and LTG to follow. 02/04/18 Patient will improve FGA to >=17/30 (4 pts being [place] for PD)    Baseline  22/30  03/15/18    Time  8    Period  Weeks    Status  Achieved      PT LONG TERM GOAL #4   Title  The patient will perform 360 degree turn with < or equal to 8 steps (baseline is 18 steps). 02/04/18 8 steps (assessed at STGs)    Time  8    Period  Weeks    Status  Achieved      Pt has met 3 of 4 LTGs, improving FGA, 5x sit<>stand score and turning ability.   Remaining deficits: Posture, transfers, foot clearance/step length-improving   Education / Equipment: Educated in [employer], Parkinson's community information, community fitness options.    Plan: Patient agrees to discharge.  Patient goals were met. Patient is being discharged due to meeting the stated rehab goals.  ?????Plan for return PT eval in 6-9 months due to progressive nature of disease process.    Mady Haagensen, PT 03/15/18 11:25 AM Phone: [phone] Fax: [phone]

## 2018-03-18 DIAGNOSIS — M47816 Spondylosis without myelopathy or radiculopathy, lumbar region: Secondary | ICD-10-CM | POA: Diagnosis not present

## 2018-03-18 DIAGNOSIS — M4712 Other spondylosis with myelopathy, cervical region: Secondary | ICD-10-CM | POA: Diagnosis not present

## 2018-03-18 DIAGNOSIS — M47812 Spondylosis without myelopathy or radiculopathy, cervical region: Secondary | ICD-10-CM | POA: Diagnosis not present

## 2018-03-18 DIAGNOSIS — M4716 Other spondylosis with myelopathy, lumbar region: Secondary | ICD-10-CM | POA: Diagnosis not present

## 2018-03-22 ENCOUNTER — Ambulatory Visit: Payer: PPO | Attending: Physician Assistant | Admitting: Speech Pathology

## 2018-03-22 DIAGNOSIS — M546 Pain in thoracic spine: Secondary | ICD-10-CM | POA: Diagnosis not present

## 2018-03-22 DIAGNOSIS — M9903 Segmental and somatic dysfunction of lumbar region: Secondary | ICD-10-CM | POA: Diagnosis not present

## 2018-03-22 DIAGNOSIS — R471 Dysarthria and anarthria: Secondary | ICD-10-CM | POA: Insufficient documentation

## 2018-03-22 DIAGNOSIS — M9902 Segmental and somatic dysfunction of thoracic region: Secondary | ICD-10-CM | POA: Diagnosis not present

## 2018-03-22 DIAGNOSIS — M47814 Spondylosis without myelopathy or radiculopathy, thoracic region: Secondary | ICD-10-CM | POA: Diagnosis not present

## 2018-03-22 NOTE — Therapy (Signed)
[facility] [address] [place], [place], [address] Phone: [phone]   Fax:  [phone]  Speech Language Pathology Treatment  Patient Details  Name: Stanley Wright MRN: [id number] Date of Birth: December 04, 1945 Referring Provider (SLP): Neldon Newport., MD   Encounter Date: 03/22/2018  End of Session - 03/22/18 0916    Visit Number  8    Number of Visits  17    Date for SLP Re-Evaluation  04/30/18    SLP Start Time  0808    SLP Stop Time   0852    SLP Time Calculation (min)  44 min    Activity Tolerance  Patient tolerated treatment well       Past Medical History:  Diagnosis Date  . Arthritis   . BENIGN PROSTATIC HYPERTROPHY, WITH OBSTRUCTION   . Cancer (HCC)    skin, basal, squamous  . COLONIC POLYPS   . Diverticulitis   . DVT (deep venous thrombosis) ([facility]) 2000  . Dysrhythmia    Hx Afib- 2017  . Factor 5 Leiden mutation, heterozygous ([facility])   . GERD (gastroesophageal reflux disease)    not on medication  . HIATAL HERNIA   . ITP (idiopathic thrombocytopenic purpura) 2003  . Long term current use of anticoagulant   . Parkinson's disease ([facility]) 02/03/2018  . PSORIASIS   . Pulmonary embolus ([facility]) 2000  . Shortness of breath dyspnea    at times - Talking alot  . Sleep apnea     Past Surgical History:  Procedure Laterality Date  . CARDIOVERSION N/A 11/22/2015   Procedure: CARDIOVERSION;  Surgeon: Sanda Klein, MD;  [facility];  Service: Cardiovascular;  Laterality: N/A;  . COLONOSCOPY    . HERNIA REPAIR Left    Inguinal- x2 . mesh x1  . INSERTION OF MESH N/A 02/25/2016   Procedure: INSERTION OF MESH;  Surgeon: Rolm Bookbinder, MD;  Location: [facility];  Service: General;  Laterality: N/A;  . LUNG REMOVAL, PARTIAL Right 2004   was fungal and not cancerous  . PROSTATE SURGERY    . UMBILICAL HERNIA REPAIR N/A 02/25/2016   Procedure: LAPAROSCOPIC UMBILICAL HERNIA REPAIR;  Surgeon: Rolm Bookbinder,  MD;  Location: [facility];  Service: General;  Laterality: N/A;    There were no vitals filed for this visit.  Subjective Assessment - 03/22/18 0809    Subjective  "Someone told me, 'your voice was stronger today and I could hear you.'"    Currently in Pain?  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 Began session in seated position, occasional min-mod A to avoid holding his breath prior to initiating /a/, which resulted in a more strained vocal quality. Average 90dB with improved quality after initial cues. Occasional min A for AB in sentences (90%) and to avoid holding his breath prior to spontaneous responses (1-2 sentence) (75%). 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 Mod-max A required for AB. Worked with pt on  standing in one position, using written sentences to improve focus on AB. Pt success in this manner was 85% accuracy, rare min A for "strong" voice.      Assessment / Recommendations / Plan   Plan  Continue with current plan of care      Progression Toward Goals   Progression toward goals  Progressing toward goals       SLP Education - 03/22/18 0917    Education Details  consider using some written key words and sentences when speaking in front of a group to help refocus on abdominal breathing    Person(s) Educated  Patient    Methods  Explanation    Comprehension  Verbalized understanding;Verbal cues required;Need further instruction;Returned demonstration       SLP Short Term Goals - 03/22/18 0918      SLP SHORT  TERM GOAL #1   Title  pt will produce loud /a/ at average low 90s dB over 4 sessions    Baseline  03-15-18, 03/22/18    Time  1    Period  Weeks    Status  Partially Met      SLP SHORT TERM GOAL #2   Title  pt will generate 18/20 sentences with WNL average volume over 3 sessions    Baseline  03-15-18, 03/22/18    Time  1    Period  Weeks    Status  Partially Met      SLP SHORT TERM GOAL #3   Title  pt will use abdominal breathing at rest 85% success over 2 sessions    Baseline  03-15-18, 03/22/18    Time  1    Period  Weeks    Status  Achieved      SLP SHORT TERM GOAL #4   Title  In 7 minutes simple conversation pt will achieve average speech volume of low 70s dB over two sessions    Time  1    Period  Weeks    Status  Not Met       SLP Long Term Goals - 03/22/18 0919      SLP LONG TERM GOAL #1   Title  Pt will generate loud /a/ with average of low 90s dB over 6 sessions    Time  4    Period  Weeks    Status  On-going      SLP LONG TERM GOAL #2   Title  pt will use abdominal breathing 70% of the time in simple-mod complex conversation over three sessions    Time  4    Period  Weeks    Status  On-going      SLP LONG TERM GOAL #3   Title  In 10 minutes simple-mod complex conversation pt will achieve average speech volume of low 70s dB over three sessions    Time  4    Period  Weeks    Status  On-going       Plan - 03/22/18 0916    Clinical Impression Statement  Pt presents today with cont'd suboptimal conversational voice volume with average mid 60s db. Pt strained/strangled voice occurred  less often today with incr'd focus on abdominal breathing. SLP reiterated to pt to think about LOUDNESS only, not diction, not "Getting the words out" (as pt stated). He would benefit from skilled ST focusing on pt's goal of improving commnicative ability with family and in the community.    Speech Therapy Frequency  2x / week  Treatment/Interventions  SLP instruction and  feedback;Compensatory strategies;Patient/family education;Internal/external aids;Functional tasks;Cueing hierarchy;Environmental controls    Potential to Achieve Goals  Good       Patient will benefit from skilled therapeutic intervention in order to improve the following deficits and impairments:   Dysarthria and anarthria    Problem List Patient Active Problem List   Diagnosis Date Noted  . Parkinson's disease ([facility]) 02/03/2018  . Community acquired pneumonia of left lower lobe of lung ([facility]) 12/23/2017  . Sepsis ([facility]) 12/23/2017  . Morbid obesity due to excess calories ([facility]) 08/23/2016  . OSA on CPAP 08/23/2016  . Dyspnea on exertion 08/22/2016  . Umbilical hernia 02/98/4730  . Persistent atrial fibrillation   . Encounter for therapeutic drug monitoring 08/12/2013  . Long term current use of anticoagulant 07/30/2010  . COLONIC POLYPS 06/03/2010  . 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 GERD 06/03/2010  . HIATAL HERNIA 06/03/2010  . BENIGN PROSTATIC HYPERTROPHY, WITH OBSTRUCTION 06/03/2010  . PSORIASIS 06/03/2010   Deneise Lever, [place], [place] E Aaliyana Fredericks 03/22/2018, 9:19 AM  [facility] [address] [place], [place], [address] Phone: [phone]   Fax:  [phone]   Name: Stanley Wright MRN: [id number] Date of Birth: 15-Jan-1946

## 2018-03-22 NOTE — Patient Instructions (Signed)
   When you are doing your Ahhhhs, try not to hold your breath at the "top of the hill." Keep the air flowing and start your Ahhhh. You can even try "Haaaaaaaaahhh" if this helps.

## 2018-03-24 DIAGNOSIS — M4712 Other spondylosis with myelopathy, cervical region: Secondary | ICD-10-CM | POA: Diagnosis not present

## 2018-03-24 DIAGNOSIS — M542 Cervicalgia: Secondary | ICD-10-CM | POA: Diagnosis not present

## 2018-03-24 DIAGNOSIS — M545 Low back pain: Secondary | ICD-10-CM | POA: Diagnosis not present

## 2018-03-24 DIAGNOSIS — M4716 Other spondylosis with myelopathy, lumbar region: Secondary | ICD-10-CM | POA: Diagnosis not present

## 2018-03-25 ENCOUNTER — Ambulatory Visit: Payer: PPO | Admitting: Speech Pathology

## 2018-03-25 DIAGNOSIS — R471 Dysarthria and anarthria: Secondary | ICD-10-CM

## 2018-03-25 NOTE — Therapy (Signed)
[facility] [address] [place], [place], [address] Phone: [phone]   Fax:  [phone]  Speech Language Pathology Treatment  Patient Details  Name: Stanley Wright MRN: [id number] Date of Birth: 21-Oct-1945 Referring Provider (SLP): Neldon Newport., MD   Encounter Date: 03/25/2018  End of Session - 03/25/18 1139    Visit Number  9    Number of Visits  17    Date for SLP Re-Evaluation  04/30/18    SLP Start Time  87    SLP Stop Time   1100    SLP Time Calculation (min)  41 min    Activity Tolerance  Patient tolerated treatment well       Past Medical History:  Diagnosis Date  . Arthritis   . BENIGN PROSTATIC HYPERTROPHY, WITH OBSTRUCTION   . Cancer (HCC)    skin, basal, squamous  . COLONIC POLYPS   . Diverticulitis   . DVT (deep venous thrombosis) ([facility]) 2000  . Dysrhythmia    Hx Afib- 2017  . Factor 5 Leiden mutation, heterozygous ([facility])   . GERD (gastroesophageal reflux disease)    not on medication  . HIATAL HERNIA   . ITP (idiopathic thrombocytopenic purpura) 2003  . Long term current use of anticoagulant   . Parkinson's disease ([facility]) 02/03/2018  . PSORIASIS   . Pulmonary embolus ([facility]) 2000  . Shortness of breath dyspnea    at times - Talking alot  . Sleep apnea     Past Surgical History:  Procedure Laterality Date  . CARDIOVERSION N/A 11/22/2015   Procedure: CARDIOVERSION;  Surgeon: Sanda Klein, MD;  [facility];  Service: Cardiovascular;  Laterality: N/A;  . COLONOSCOPY    . HERNIA REPAIR Left    Inguinal- x2 . mesh x1  . INSERTION OF MESH N/A 02/25/2016   Procedure: INSERTION OF MESH;  Surgeon: Rolm Bookbinder, MD;  Location: [facility];  Service: General;  Laterality: N/A;  . LUNG REMOVAL, PARTIAL Right 2004   was fungal and not cancerous  . PROSTATE SURGERY    . UMBILICAL HERNIA REPAIR N/A 02/25/2016   Procedure: LAPAROSCOPIC UMBILICAL HERNIA REPAIR;  Surgeon: Rolm Bookbinder, MD;  Location: [facility];  Service: General;  Laterality: N/A;    There were no vitals filed for this visit.  Subjective Assessment - 03/25/18 1022    Subjective  "I had 2 MRIs yesterday." (neck and lower back)    Currently in Pain?  No/denies            ADULT SLP TREATMENT - 03/25/18 1020      General Information   Behavior/Cognition  Alert;Cooperative      Treatment Provided   Treatment provided  Cognitive-Linquistic      Cognitive-Linquistic Treatment   Treatment focused on  Dysarthria;Voice    Skilled Treatment  Pt reports difficulty coordinating his breathing with /a/ this morning. Mod cues required today initially for coordination of respiration/phonation and laryngeal strain. SLP used "Hey" to help pt with abdominal recruitment and pt subsequently achieved average low 90s dB, vocal quality WNL. Pt clearing throat frequently today; he reports congestion after lying flat for MRI yesterday. SLP encouraged pt to sip water vs throat clearing. Sentence level responses averaged low 70s dB, mod I in task but usual min-mod A for carryover into spontaneous responses between structured tasks. Pt requested to work on his scripture reading for Sunday while standing. In initial attempt, pt noted to pause but not take additional breaths,  and so SLP worked with pt on tandem breathing (tactile and demonstration cues) while reading, additionally using visual aid (highlighter to ID "pause and breathe" locations in the passage). By end of the second repetition of the passage, pt accuracy was ~80% with support cues faded to occasional min A.      Assessment / Recommendations / Plan   Plan  Continue with current plan of care      Progression Toward Goals   Progression toward goals  Progressing toward goals         SLP Short Term Goals - 03/25/18 1140      SLP SHORT TERM GOAL #1   Title  pt will produce loud /a/ at average low 90s dB over 4 sessions    Status  Partially Met       SLP SHORT TERM GOAL #2   Title  pt will generate 18/20 sentences with WNL average volume over 3 sessions    Status  Partially Met      SLP SHORT TERM GOAL #3   Title  pt will use abdominal breathing at rest 85% success over 2 sessions    Status  Achieved      SLP SHORT TERM GOAL #4   Title  In 7 minutes simple conversation pt will achieve average speech volume of low 70s dB over two sessions    Status  Not Met       SLP Long Term Goals - 03/25/18 1141      SLP LONG TERM GOAL #1   Title  Pt will generate loud /a/ with average of low 90s dB over 6 sessions    Time  4    Period  Weeks    Status  On-going      SLP LONG TERM GOAL #2   Title  pt will use abdominal breathing 70% of the time in simple-mod complex conversation over three sessions    Time  4    Period  Weeks    Status  On-going      SLP LONG TERM GOAL #3   Title  In 10 minutes simple-mod complex conversation pt will achieve average speech volume of low 70s dB over three sessions    Time  4    Period  Weeks    Status  On-going       Plan - 03/25/18 1139    Clinical Impression Statement  Pt presents today with cont'd suboptimal conversational voice volume with average mid 60s db. Pt strained/strangled voice occurred  less often today with incr'd focus on abdominal breathing. When progressing to tasks of increased length/complexity, mod cues required for coordinating respiration/phonation. He would benefit from skilled ST focusing on pt's goal of improving commnicative ability with family and in the community.    Speech Therapy Frequency  2x / week    Duration  --   8 weeks or 17 visits   Treatment/Interventions  SLP instruction and feedback;Compensatory strategies;Patient/family education;Internal/external aids;Functional tasks;Cueing hierarchy;Environmental controls    Potential to Achieve Goals  Good       Patient will benefit from skilled therapeutic intervention in order to improve the following deficits and  impairments:   Dysarthria and anarthria    Problem List Patient Active Problem List   Diagnosis Date Noted  . Parkinson's disease (HCC) 02/03/2018  . Community acquired pneumonia of left lower lobe of lung (HCC) 12/23/2017  . Sepsis (HCC) 12/23/2017  . Morbid obesity due to excess calories ([facility]) 08/23/2016  .   OSA on CPAP 08/23/2016  . Dyspnea on exertion 08/22/2016  . Umbilical hernia 02/25/2016  . Persistent atrial fibrillation   . Encounter for therapeutic drug monitoring 08/12/2013  . Long term current use of anticoagulant 07/30/2010  . COLONIC POLYPS 06/03/2010  . 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 GERD 06/03/2010  . HIATAL HERNIA 06/03/2010  . BENIGN PROSTATIC HYPERTROPHY, WITH OBSTRUCTION 06/03/2010  . PSORIASIS 06/03/2010   Mary Beth Bardin, MS, CCC-SLP Speech-Language Pathologist  Mary E Bardin 03/25/2018, 11:41 AM  [facility] [address] [place], [place], [address] Phone: [phone]   Fax:  [phone]   Name: Verner L Tewell MRN: [id number] Date of Birth: 01/04/1946 

## 2018-03-26 ENCOUNTER — Ambulatory Visit (INDEPENDENT_AMBULATORY_CARE_PROVIDER_SITE_OTHER): Payer: PPO | Admitting: *Deleted

## 2018-03-26 DIAGNOSIS — I4819 Other persistent atrial fibrillation: Secondary | ICD-10-CM | POA: Diagnosis not present

## 2018-03-26 DIAGNOSIS — Z5181 Encounter for therapeutic drug level monitoring: Secondary | ICD-10-CM | POA: Diagnosis not present

## 2018-03-26 LAB — POCT INR: INR: 2.1 (ref 2.0–3.0)

## 2018-03-26 NOTE — Patient Instructions (Signed)
Description   Continue same dosage of 1 tablet every day. Recheck INR in 6 weeks. Call with any questions if gets any new medications or scheduled for any procedures  [phone]    

## 2018-03-29 ENCOUNTER — Ambulatory Visit: Payer: PPO

## 2018-03-29 DIAGNOSIS — M62830 Muscle spasm of back: Secondary | ICD-10-CM | POA: Diagnosis not present

## 2018-03-29 DIAGNOSIS — R471 Dysarthria and anarthria: Secondary | ICD-10-CM

## 2018-03-29 DIAGNOSIS — M9901 Segmental and somatic dysfunction of cervical region: Secondary | ICD-10-CM | POA: Diagnosis not present

## 2018-03-29 DIAGNOSIS — M47816 Spondylosis without myelopathy or radiculopathy, lumbar region: Secondary | ICD-10-CM | POA: Diagnosis not present

## 2018-03-29 DIAGNOSIS — M542 Cervicalgia: Secondary | ICD-10-CM | POA: Diagnosis not present

## 2018-03-29 NOTE — Patient Instructions (Signed)
  Please complete the assigned speech therapy homework prior to your next session and return it to the speech therapist at your next visit.  

## 2018-03-29 NOTE — Therapy (Addendum)
[facility] [address] [place], [place], [address] Phone: [phone]   Fax:  [phone]  Speech Language Pathology Treatment  Patient Details  Name: Stanley Wright MRN: [id number] Date of Birth: Nov 05, 1945 Referring Provider (SLP): Neldon Newport., MD   Encounter Date: 03/29/2018  End of Session - 03/29/18 1045    Visit Number  10    Number of Visits  17    Date for SLP Re-Evaluation  04/30/18    SLP Start Time  0850    SLP Stop Time   0930    SLP Time Calculation (min)  40 min    Activity Tolerance  Patient tolerated treatment well       Past Medical History:  Diagnosis Date  . Arthritis   . BENIGN PROSTATIC HYPERTROPHY, WITH OBSTRUCTION   . Cancer (HCC)    skin, basal, squamous  . COLONIC POLYPS   . Diverticulitis   . DVT (deep venous thrombosis) ([facility]) 2000  . Dysrhythmia    Hx Afib- 2017  . Factor 5 Leiden mutation, heterozygous ([facility])   . GERD (gastroesophageal reflux disease)    not on medication  . HIATAL HERNIA   . ITP (idiopathic thrombocytopenic purpura) 2003  . Long term current use of anticoagulant   . Parkinson's disease ([facility]) 02/03/2018  . PSORIASIS   . Pulmonary embolus ([facility]) 2000  . Shortness of breath dyspnea    at times - Talking alot  . Sleep apnea     Past Surgical History:  Procedure Laterality Date  . CARDIOVERSION N/A 11/22/2015   Procedure: CARDIOVERSION;  Surgeon: Sanda Klein, MD;  [facility];  Service: Cardiovascular;  Laterality: N/A;  . COLONOSCOPY    . HERNIA REPAIR Left    Inguinal- x2 . mesh x1  . INSERTION OF MESH N/A 02/25/2016   Procedure: INSERTION OF MESH;  Surgeon: Rolm Bookbinder, MD;  Location: [facility];  Service: General;  Laterality: N/A;  . LUNG REMOVAL, PARTIAL Right 2004   was fungal and not cancerous  . PROSTATE SURGERY    . UMBILICAL HERNIA REPAIR N/A 02/25/2016   Procedure: LAPAROSCOPIC UMBILICAL HERNIA REPAIR;  Surgeon: Rolm Bookbinder, MD;  Location: [facility];  Service: General;  Laterality: N/A;    There were no vitals filed for this visit.  Subjective Assessment - 03/29/18 0810    Subjective  "I told her I wasn't interested in the back and forth, but being louder when presenting in a group." Pt relates SLP told him that interpersonal loudness in conversation will help with communication to a group.     Currently in Pain?  No/denies            ADULT SLP TREATMENT - 03/29/18 0811      General Information   Behavior/Cognition  Alert;Cooperative;Pleasant mood      Treatment Provided   Treatment provided  Cognitive-Linquistic      Cognitive-Linquistic Treatment   Treatment focused on  Dysarthria;Voice    Skilled Treatment  Loud /a/ facilitated with loud "hey" initially, to feel appropriate abdominal push. Average /a/ low 90s dB with appropriate abdominal push with min A rarely for full breath. SLP assisted pt with breathing with his scripture reading; pt, by second attempt was not taking any "cheat breaths" as he called them, and had success with adequate breath. Sentence responses were completed with SLP rare min cueing for pt to take full breath prior to responding with LOUD voice. Pt with volume average upper  60s dB.       Assessment / Recommendations / Plan   Plan  Continue with current plan of care      Progression Toward Goals   Progression toward goals  Progressing toward goals         SLP Short Term Goals - 03/25/18 1140      SLP SHORT TERM GOAL #1   Title  pt will produce loud /a/ at average low 90s dB over 4 sessions    Status  Partially Met      SLP SHORT TERM GOAL #2   Title  pt will generate 18/20 sentences with WNL average volume over 3 sessions    Status  Partially Met      SLP SHORT TERM GOAL #3   Title  pt will use abdominal breathing at rest 85% success over 2 sessions    Status  Achieved      SLP SHORT TERM GOAL #4   Title  In 7 minutes simple conversation pt will achieve  average speech volume of low 70s dB over two sessions    Status  Not Met       SLP Long Term Goals - 03/29/18 1047      SLP LONG TERM GOAL #1   Title  Pt will generate loud /a/ with average of low 90s dB over 6 sessions    Baseline  03-29-18    Time  3    Period  Weeks    Status  On-going      SLP LONG TERM GOAL #2   Title  pt will use abdominal breathing 70% of the time in 5 minutes simple-mod complex conversation     Time  3    Period  Weeks    Status  On-going      SLP LONG TERM GOAL #3   Title  In 10 minutes simple-mod complex conversation pt will achieve average speech volume of low 70s dB over three sessions    Time  3    Period  Weeks    Status  On-going      SLP LONG TERM GOAL #4   Title  pt will use abdominal breathing 80% of the time in 8 minutes simple conversation    Time  3    Period  Weeks    Status  New       Plan - 03/29/18 1045    Clinical Impression Statement  Pt presents today with cont'd suboptimal conversational voice volume with average mid 60s db. Pt strained/strangled voice occurred minimally today. Louder, near-WNL loud speech in sentence responses today, however when pt speaking between responses, suboptimal loudness heard consistently. Pt would continue to benefit from skilled ST focusing on pt's goal of improving commnicative ability with family and in the community.    Speech Therapy Frequency  2x / week    Duration  --   8 weeks or 17 visits   Treatment/Interventions  SLP instruction and feedback;Compensatory strategies;Patient/family education;Internal/external aids;Functional tasks;Cueing hierarchy;Environmental controls    Potential to Achieve Goals  Good       Patient will benefit from skilled therapeutic intervention in order to improve the following deficits and impairments:   Dysarthria and anarthria   Speech Therapy Progress Note  Dates of Reporting Period: 02-09-18 to present  Subjective Statement: Pt has worked with [facility] for 10  sessions focusing on speech loudness due to changes from Parkinson's disease.  Objective Measurements: Pt's average loud /a/ measurement has improved  since evaluation, and pt's average loudness in sentence responses has improved to near-WNL/WNL.   Goal Update: See goals above.  Plan: Cont to see pt for approx 4-6 more weeks to maximize pt's speech loudness, including appropriate breathing technique for WNL speech loudenss  Reason Skilled Services are Required: Pt has not mastered WNL loudness in spontaneous speech.   Problem List Patient Active Problem List   Diagnosis Date Noted  . Parkinson's disease ([facility]) 02/03/2018  . Community acquired pneumonia of left lower lobe of lung ([facility]) 12/23/2017  . Sepsis ([facility]) 12/23/2017  . Morbid obesity due to excess calories ([facility]) 08/23/2016  . OSA on CPAP 08/23/2016  . Dyspnea on exertion 08/22/2016  . Umbilical hernia 63/06/6008  . Persistent atrial fibrillation   . Encounter for therapeutic drug monitoring 08/12/2013  . Long term current use of anticoagulant 07/30/2010  . COLONIC POLYPS 06/03/2010  . 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 GERD 06/03/2010  . HIATAL HERNIA 06/03/2010  . BENIGN PROSTATIC HYPERTROPHY, WITH OBSTRUCTION 06/03/2010  . PSORIASIS 06/03/2010    SCHINKE,CARL ,MS, [place]  03/29/2018, 10:49 AM  [facility] [address] [place], [place], [address] Phone: [phone]   Fax:  [phone]   Name: ASHKAN CHAMBERLAND MRN: [id number] Date of Birth: 02-Jun-1946

## 2018-03-30 DIAGNOSIS — H57813 Brow ptosis, bilateral: Secondary | ICD-10-CM | POA: Diagnosis not present

## 2018-03-30 DIAGNOSIS — H02423 Myogenic ptosis of bilateral eyelids: Secondary | ICD-10-CM | POA: Diagnosis not present

## 2018-03-30 DIAGNOSIS — H53483 Generalized contraction of visual field, bilateral: Secondary | ICD-10-CM | POA: Diagnosis not present

## 2018-03-30 DIAGNOSIS — H0279 Other degenerative disorders of eyelid and periocular area: Secondary | ICD-10-CM | POA: Diagnosis not present

## 2018-03-30 DIAGNOSIS — H02831 Dermatochalasis of right upper eyelid: Secondary | ICD-10-CM | POA: Diagnosis not present

## 2018-03-30 DIAGNOSIS — H02413 Mechanical ptosis of bilateral eyelids: Secondary | ICD-10-CM | POA: Diagnosis not present

## 2018-03-30 DIAGNOSIS — H02834 Dermatochalasis of left upper eyelid: Secondary | ICD-10-CM | POA: Diagnosis not present

## 2018-04-01 ENCOUNTER — Ambulatory Visit: Payer: PPO | Admitting: Speech Pathology

## 2018-04-01 DIAGNOSIS — R471 Dysarthria and anarthria: Secondary | ICD-10-CM | POA: Diagnosis not present

## 2018-04-01 NOTE — Therapy (Signed)
[facility] [address] [place], [place], [address] Phone: [phone]   Fax:  [phone]  Speech Language Pathology Treatment  Patient Details  Name: Stanley Wright MRN: [id number] Date of Birth: [birth year] Referring Provider (SLP): Neldon Newport., MD   Encounter Date: 04/01/2018  End of Session - 04/01/18 0843    Visit Number  11    Number of Visits  17    Date for SLP Re-Evaluation  04/30/18    SLP Start Time  0801    SLP Stop Time   0845    SLP Time Calculation (min)  44 min    Activity Tolerance  Patient tolerated treatment well       Past Medical History:  Diagnosis Date  . Arthritis   . BENIGN PROSTATIC HYPERTROPHY, WITH OBSTRUCTION   . Cancer (HCC)    skin, basal, squamous  . COLONIC POLYPS   . Diverticulitis   . DVT (deep venous thrombosis) ([place]) 2000  . Dysrhythmia    Hx Afib- 2017  . Factor 5 Leiden mutation, heterozygous ([place])   . GERD (gastroesophageal reflux disease)    not on medication  . HIATAL HERNIA   . ITP (idiopathic thrombocytopenic purpura) 2003  . Long term current use of anticoagulant   . Parkinson's disease ([facility]) 02/03/2018  . PSORIASIS   . Pulmonary embolus ([facility]) 2000  . Shortness of breath dyspnea    at times - Talking alot  . Sleep apnea     Past Surgical History:  Procedure Laterality Date  . CARDIOVERSION N/A 11/22/2015   Procedure: CARDIOVERSION;  Surgeon: Sanda Klein, MD;  [facility];  Service: Cardiovascular;  Laterality: N/A;  . COLONOSCOPY    . HERNIA REPAIR Left    Inguinal- x2 . mesh x1  . INSERTION OF MESH N/A 02/25/2016   Procedure: INSERTION OF MESH;  Surgeon: Rolm Bookbinder, MD;  Location: [facility];  Service: General;  Laterality: N/A;  . LUNG REMOVAL, PARTIAL Right 2004   was fungal and not cancerous  . PROSTATE SURGERY    . UMBILICAL HERNIA REPAIR N/A 02/25/2016   Procedure: LAPAROSCOPIC UMBILICAL HERNIA REPAIR;  Surgeon: Rolm Bookbinder, MD;  Location: [facility];  Service: General;  Laterality: N/A;    There were no vitals filed for this visit.  Subjective Assessment - 04/01/18 0802    Subjective  "Breathe and go." Pt re: told SLP he has been trying to think less about coordinating his breathing    Currently in Pain?  No/denies            ADULT SLP TREATMENT - 04/01/18 0801      General Information   Behavior/Cognition  Alert;Cooperative;Pleasant mood    Patient Positioning  Upright in chair      Treatment Provided   Treatment provided  Cognitive-Linquistic      Pain Assessment   Pain Assessment  No/denies pain      Cognitive-Linquistic Treatment   Treatment focused on  Dysarthria;Voice    Skilled Treatment  Pt entered with conversational volume sub-WNL. Loud /a/ to recalibrate loudness and abdominal effort in conversation. Average low 90s dB for loud /a/ with rare min A for full breath. Rare min A for  full breath/vocal effort in sentence responses, average mid 70s dB. Usual min-mod A for carryover in spontaneous responses between structured tasks, initially, though as task progressed, pt self-monitored for incomplete breath/insufficient effort ~60% of the time. 5 minute monologue with averaged upper 60s dB  with rare min non-verbal cue for breathing. In initial scripture reading, pt accuracy for full, complete breaths was 50%. Pt attempted again, with focus on eliminating "cheat breaths", 80% accuracy, average low 70s dB.      Assessment / Recommendations / Plan   Plan  Continue with current plan of care      Progression Toward Goals   Progression toward goals  Progressing toward goals         SLP Short Term Goals - 04/01/18 0844      SLP SHORT TERM GOAL #1   Title  pt will produce loud /a/ at average low 90s dB over 4 sessions    Status  Partially Met      SLP SHORT TERM GOAL #2   Title  pt will generate 18/20 sentences with WNL average volume over 3 sessions    Status  Partially Met       SLP SHORT TERM GOAL #3   Title  pt will use abdominal breathing at rest 85% success over 2 sessions    Status  Achieved      SLP SHORT TERM GOAL #4   Title  In 7 minutes simple conversation pt will achieve average speech volume of low 70s dB over two sessions    Status  Not Met       SLP Long Term Goals - 04/01/18 0844      SLP LONG TERM GOAL #1   Title  Pt will generate loud /a/ with average of low 90s dB over 6 sessions    Baseline  03-29-18, 04-01-18    Time  3    Period  Weeks    Status  On-going      SLP LONG TERM GOAL #2   Title  pt will use abdominal breathing 70% of the time in 5 minutes simple-mod complex conversation     Time  3    Period  Weeks    Status  On-going      SLP LONG TERM GOAL #3   Title  In 10 minutes simple-mod complex conversation pt will achieve average speech volume of low 70s dB over three sessions    Time  3    Period  Weeks    Status  On-going      SLP LONG TERM GOAL #4   Title  pt will use abdominal breathing 80% of the time in 8 minutes simple conversation    Time  3    Period  Weeks    Status  On-going       Plan - 04/01/18 0843    Clinical Impression Statement  Pt presents today with cont'd suboptimal conversational voice volume with average mid 60s db. Pt strained/strangled voice occurred minimally today. Louder, near-WNL loud speech in sentence responses today, however when pt speaking between responses, suboptimal loudness heard consistently. Pt would continue to benefit from skilled ST focusing on pt's goal of improving commnicative ability with family and in the community.    Speech Therapy Frequency  2x / week    Duration  --   8 weeks, or 17 visits   Treatment/Interventions  SLP instruction and feedback;Compensatory strategies;Patient/family education;Internal/external aids;Functional tasks;Cueing hierarchy;Environmental controls    Potential to Achieve Goals  Good       Patient will benefit from skilled therapeutic  intervention in order to improve the following deficits and impairments:   Dysarthria and anarthria    Problem List Patient Active Problem List   Diagnosis Date  Noted  . Parkinson's disease ([place]) 02/03/2018  . Community acquired pneumonia of left lower lobe of lung ([place]) 12/23/2017  . Sepsis ([place]) 12/23/2017  . Morbid obesity due to excess calories ([facility]) 08/23/2016  . OSA on CPAP 08/23/2016  . Dyspnea on exertion 08/22/2016  . Umbilical hernia 93/81/0175  . Persistent atrial fibrillation   . Encounter for therapeutic drug monitoring 08/12/2013  . Long term current use of anticoagulant 07/30/2010  . COLONIC POLYPS 06/03/2010  . 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 GERD 06/03/2010  . HIATAL HERNIA 06/03/2010  . BENIGN PROSTATIC HYPERTROPHY, WITH OBSTRUCTION 06/03/2010  . PSORIASIS 06/03/2010   Deneise Lever, [place], [place] 04/01/2018, 8:45 AM  [facility] [address] [place], [place], [address] Phone: [phone]   Fax:  [phone]   Name: Stanley Wright MRN: [id number] Date of Birth: 1945/06/24

## 2018-04-05 DIAGNOSIS — M9901 Segmental and somatic dysfunction of cervical region: Secondary | ICD-10-CM | POA: Diagnosis not present

## 2018-04-05 DIAGNOSIS — M542 Cervicalgia: Secondary | ICD-10-CM | POA: Diagnosis not present

## 2018-04-05 DIAGNOSIS — M47816 Spondylosis without myelopathy or radiculopathy, lumbar region: Secondary | ICD-10-CM | POA: Diagnosis not present

## 2018-04-05 DIAGNOSIS — M62830 Muscle spasm of back: Secondary | ICD-10-CM | POA: Diagnosis not present

## 2018-04-07 ENCOUNTER — Ambulatory Visit: Payer: PPO | Admitting: Speech Pathology

## 2018-04-07 DIAGNOSIS — R471 Dysarthria and anarthria: Secondary | ICD-10-CM | POA: Diagnosis not present

## 2018-04-07 NOTE — Therapy (Signed)
[facility] [address] [place], [place], [address] Phone: [phone]   Fax:  [phone]  Speech Language Pathology Treatment  Patient Details  Name: Stanley Wright MRN: [id number] Date of Birth: April 12, 1946 Referring Provider (SLP): Neldon Newport., MD   Encounter Date: 04/07/2018  End of Session - 04/07/18 0814    Visit Number  12    Number of Visits  17    Date for SLP Re-Evaluation  04/30/18    SLP Start Time  0804    SLP Stop Time   0850    SLP Time Calculation (min)  46 min    Activity Tolerance  Patient tolerated treatment well       Past Medical History:  Diagnosis Date  . Arthritis   . BENIGN PROSTATIC HYPERTROPHY, WITH OBSTRUCTION   . Cancer (HCC)    skin, basal, squamous  . COLONIC POLYPS   . Diverticulitis   . DVT (deep venous thrombosis) ([facility]) 2000  . Dysrhythmia    Hx Afib- 2017  . Factor 5 Leiden mutation, heterozygous ([place])   . GERD (gastroesophageal reflux disease)    not on medication  . HIATAL HERNIA   . ITP (idiopathic thrombocytopenic purpura) 2003  . Long term current use of anticoagulant   . Parkinson's disease ([facility]) 02/03/2018  . PSORIASIS   . Pulmonary embolus ([facility]) 2000  . Shortness of breath dyspnea    at times - Talking alot  . Sleep apnea     Past Surgical History:  Procedure Laterality Date  . CARDIOVERSION N/A 11/22/2015   Procedure: CARDIOVERSION;  Surgeon: Sanda Klein, MD;  [facility];  Service: Cardiovascular;  Laterality: N/A;  . COLONOSCOPY    . HERNIA REPAIR Left    Inguinal- x2 . mesh x1  . INSERTION OF MESH N/A 02/25/2016   Procedure: INSERTION OF MESH;  Surgeon: Rolm Bookbinder, MD;  Location: [facility];  Service: General;  Laterality: N/A;  . LUNG REMOVAL, PARTIAL Right 2004   was fungal and not cancerous  . PROSTATE SURGERY    . UMBILICAL HERNIA REPAIR N/A 02/25/2016   Procedure: LAPAROSCOPIC UMBILICAL HERNIA REPAIR;  Surgeon: Rolm Bookbinder, MD;  Location: [facility];  Service: General;  Laterality: N/A;    There were no vitals filed for this visit.  Subjective Assessment - 04/07/18 0807    Subjective  "Talking to individuals... I did poorly. I didn't breathe properly."    Currently in Pain?  No/denies            ADULT SLP TREATMENT - 04/07/18 0804      General Information   Behavior/Cognition  Alert;Cooperative;Pleasant mood      Treatment Provided   Treatment provided  Cognitive-Linquistic      Pain Assessment   Pain Assessment  No/denies pain      Cognitive-Linquistic Treatment   Treatment focused on  Dysarthria;Voice    Skilled Treatment  Conversational volume was sub-WNL when pt entered treatment room. SLP used loud "Hey" and /a/ to recalibrate pt's loudness and abdominal effort in conversation. Pt slow to initiate these tasks, SLP suspects due to cognitive burden of attempting to coordinate breathing. Ultimately he was able to complete, with cues to "breathe and go," and average was 91 dB at 30 cm. Sentence level question and answer with significant loudness fade and delayed initiation. SLP cued pt to focus on "shout" vs breathing, and automaticity improved. He maintained average in low-mid 70s dB with occasional min A  in sentence level spontaneous response. Carryover to short conversations (3-5 minutes each) with focus on "shouting" was effective with occasional min-mod A, averaging low 70s dB. Pt told SLP he is practicing 1-2 times per day; SLP reinforced need to practice 2x per day for at least 20 min each time to improve carryover.      Assessment / Recommendations / Plan   Plan  Continue with current plan of care      Progression Toward Goals   Progression toward goals  Progressing toward goals         SLP Short Term Goals - 04/07/18 0842      SLP SHORT TERM GOAL #1   Title  pt will produce loud /a/ at average low 90s dB over 4 sessions    Status  Partially Met      SLP SHORT TERM GOAL #2    Title  pt will generate 18/20 sentences with WNL average volume over 3 sessions    Status  Partially Met      SLP SHORT TERM GOAL #3   Title  pt will use abdominal breathing at rest 85% success over 2 sessions    Status  Achieved      SLP SHORT TERM GOAL #4   Title  In 7 minutes simple conversation pt will achieve average speech volume of low 70s dB over two sessions    Status  Not Met       SLP Long Term Goals - 04/07/18 0843      SLP LONG TERM GOAL #1   Title  Pt will generate loud /a/ with average of low 90s dB over 6 sessions    Baseline  03-29-18, 04-01-18, 04-07-18    Time  2    Period  Weeks    Status  On-going      SLP LONG TERM GOAL #2   Title  pt will use abdominal breathing 70% of the time in 5 minutes simple-mod complex conversation     Time  2    Period  Weeks    Status  On-going      SLP LONG TERM GOAL #3   Title  In 10 minutes simple-mod complex conversation pt will achieve average speech volume of low 70s dB over three sessions    Time  2    Period  Weeks    Status  On-going      SLP LONG TERM GOAL #4   Title  pt will use abdominal breathing 80% of the time in 8 minutes simple conversation    Time  2    Period  Weeks    Status  On-going       Plan - 04/07/18 0925    Clinical Impression Statement  Pt presents today with cont'd suboptimal conversational voice volume with average mid 60s db. Pt strained/strangled voice occurred minimally today. Louder, near-WNL loud speech in sentence responses today, however when pt speaking between responses, suboptimal loudness heard consistently. Cues for "shouting" were successful in improving loudness and abdominal effort and minimized the delay in pt's responses which occur more frequently with focus on abdominal breathing. Pt would continue to benefit from skilled ST focusing on pt's goal of improving commnicative ability with family and in the community.    Speech Therapy Frequency  2x / week    Duration  --   8  weeks or 17 visits   Treatment/Interventions  SLP instruction and feedback;Compensatory strategies;Patient/family education;Internal/external aids;Functional tasks;Cueing hierarchy;Environmental controls  Potential to Achieve Goals  Good    Potential Considerations  Ability to learn/carryover information    SLP Home Exercise Plan  provided    Consulted and Agree with Plan of Care  Patient       Patient will benefit from skilled therapeutic intervention in order to improve the following deficits and impairments:   Dysarthria and anarthria    Problem List Patient Active Problem List   Diagnosis Date Noted  . Parkinson's disease ([facility]) 02/03/2018  . Community acquired pneumonia of left lower lobe of lung ([facility]) 12/23/2017  . Sepsis ([place]) 12/23/2017  . Morbid obesity due to excess calories ([place]) 08/23/2016  . OSA on CPAP 08/23/2016  . Dyspnea on exertion 08/22/2016  . Umbilical hernia 03/75/4360  . Persistent atrial fibrillation   . Encounter for therapeutic drug monitoring 08/12/2013  . Long term current use of anticoagulant 07/30/2010  . COLONIC POLYPS 06/03/2010  . 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 GERD 06/03/2010  . HIATAL HERNIA 06/03/2010  . BENIGN PROSTATIC HYPERTROPHY, WITH OBSTRUCTION 06/03/2010  . PSORIASIS 06/03/2010   Deneise Lever, [place], [place] Speech-Language Pathologist  Aliene Altes 04/07/2018, 9:28 AM  [facility] [address] [place], [place], [address] Phone: [phone]   Fax:  [phone]   Name: Stanley Wright MRN: [id number] Date of Birth: Jun 23, 1945

## 2018-04-08 ENCOUNTER — Ambulatory Visit: Payer: PPO

## 2018-04-08 DIAGNOSIS — R471 Dysarthria and anarthria: Secondary | ICD-10-CM | POA: Diagnosis not present

## 2018-04-08 NOTE — Therapy (Signed)
[facility] [address] [place], [place], [address] Phone: [phone]   Fax:  [phone]  Speech Language Pathology Treatment  Patient Details  Name: Stanley Wright MRN: [id number] Date of Birth: [birth year] Referring Provider (SLP): Neldon Newport., MD   Encounter Date: 04/08/2018  End of Session - 04/08/18 0843    Visit Number  13    Number of Visits  17    Date for SLP Re-Evaluation  04/30/18    SLP Start Time  0807    SLP Stop Time   0847    SLP Time Calculation (min)  40 min    Activity Tolerance  Patient tolerated treatment well       Past Medical History:  Diagnosis Date  . Arthritis   . BENIGN PROSTATIC HYPERTROPHY, WITH OBSTRUCTION   . Cancer (HCC)    skin, basal, squamous  . COLONIC POLYPS   . Diverticulitis   . DVT (deep venous thrombosis) ([facility]) 2000  . Dysrhythmia    Hx Afib- 2017  . Factor 5 Leiden mutation, heterozygous ([facility])   . GERD (gastroesophageal reflux disease)    not on medication  . HIATAL HERNIA   . ITP (idiopathic thrombocytopenic purpura) 2003  . Long term current use of anticoagulant   . Parkinson's disease ([facility]) 02/03/2018  . PSORIASIS   . Pulmonary embolus ([place]) 2000  . Shortness of breath dyspnea    at times - Talking alot  . Sleep apnea     Past Surgical History:  Procedure Laterality Date  . CARDIOVERSION N/A 11/22/2015   Procedure: CARDIOVERSION;  Surgeon: Sanda Klein, MD;  [facility];  Service: Cardiovascular;  Laterality: N/A;  . COLONOSCOPY    . HERNIA REPAIR Left    Inguinal- x2 . mesh x1  . INSERTION OF MESH N/A 02/25/2016   Procedure: INSERTION OF MESH;  Surgeon: Rolm Bookbinder, MD;  Location: [facility];  Service: General;  Laterality: N/A;  . LUNG REMOVAL, PARTIAL Right 2004   was fungal and not cancerous  . PROSTATE SURGERY    . UMBILICAL HERNIA REPAIR N/A 02/25/2016   Procedure: LAPAROSCOPIC UMBILICAL HERNIA REPAIR;  Surgeon: Rolm Bookbinder, MD;  Location: [facility];  Service: General;  Laterality: N/A;    There were no vitals filed for this visit.  Subjective Assessment - 04/08/18 1547    Subjective  "I have to breathe to shout."            ADULT SLP TREATMENT - 04/08/18 0809      General Information   Behavior/Cognition  Alert;Cooperative;Pleasant mood      Treatment Provided   Treatment provided  Cognitive-Linquistic      Cognitive-Linquistic Treatment   Treatment focused on  Dysarthria;Voice    Skilled Treatment  SLP used loud /a/ to initially incr pt's effort and loudness in order to recalibrate his speaking loudness in conversation. In semi-sructured multi sentence responses pt's average was low 70s dB. In simple-mod complex but familiar conversation of 10 minutes pt maintained average low 70s dB with cues to incr. volume x3. In more unfamiliar topics of 4 minutes pt req'd rare min cues for loudness; average loudness was upper 60s - low 70s with more frequent speaking on residual lung volume.     Assessment / Recommendations / Plan   Plan  Continue with current plan of care      Progression Toward Goals   Progression toward goals  Progressing toward goals  SLP Short Term Goals - 04/07/18 0272      SLP SHORT TERM GOAL #1   Title  pt will produce loud /a/ at average low 90s dB over 4 sessions    Status  Partially Met      SLP SHORT TERM GOAL #2   Title  pt will generate 18/20 sentences with WNL average volume over 3 sessions    Status  Partially Met      SLP SHORT TERM GOAL #3   Title  pt will use abdominal breathing at rest 85% success over 2 sessions    Status  Achieved      SLP SHORT TERM GOAL #4   Title  In 7 minutes simple conversation pt will achieve average speech volume of low 70s dB over two sessions    Status  Not Met       SLP Long Term Goals - 04/08/18 0849      SLP LONG TERM GOAL #1   Title  Pt will generate loud /a/ with average of low 90s dB over 6 sessions     Baseline  03-29-18, 04-01-18, 04-07-18, 04-08-18    Time  2    Period  Weeks    Status  On-going      SLP LONG TERM GOAL #2   Title  pt will use abdominal breathing 70% of the time in 5 minutes simple-mod complex conversation     Time  2    Period  Weeks    Status  On-going      SLP LONG TERM GOAL #3   Title  In 10 minutes simple-mod complex conversation pt will achieve average speech volume of low 70s dB over three sessions    Time  2    Period  Weeks    Status  On-going      SLP LONG TERM GOAL #4   Title  pt will use abdominal breathing 80% of the time in 8 minutes simple conversation    Time  2    Period  Weeks    Status  On-going       Plan - 04/08/18 0847    Clinical Impression Statement  Pt strained/strangled voice occurred minimally today, and in familiar conversation and less-familar conversation pt maintained WNL volume for short periods (<10 minutes). When pt speaking between responses, more WNL loudness heard intermittently. "I've been practicing a lot," pt reported. Pt would continue to benefit from skilled ST focusing on pt's goal of improving commnicative ability with family and in the community.    Speech Therapy Frequency  2x / week    Duration  --   8 weeks or 17 visits   Treatment/Interventions  SLP instruction and feedback;Compensatory strategies;Patient/family education;Internal/external aids;Functional tasks;Cueing hierarchy;Environmental controls    Potential to Achieve Goals  Good    Potential Considerations  Ability to learn/carryover information       Patient will benefit from skilled therapeutic intervention in order to improve the following deficits and impairments:   Dysarthria and anarthria    Problem List Patient Active Problem List   Diagnosis Date Noted  . Parkinson's disease ([place]) 02/03/2018  . Community acquired pneumonia of left lower lobe of lung ([place]) 12/23/2017  . Sepsis ([place]) 12/23/2017  . Morbid obesity due to excess calories  ([place]) 08/23/2016  . OSA on CPAP 08/23/2016  . Dyspnea on exertion 08/22/2016  . Umbilical hernia 53/66/4403  . Persistent atrial fibrillation   . Encounter for therapeutic drug monitoring 08/12/2013  .  Long term current use of anticoagulant 07/30/2010  . COLONIC POLYPS 06/03/2010  . 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 GERD 06/03/2010  . HIATAL HERNIA 06/03/2010  . BENIGN PROSTATIC HYPERTROPHY, WITH OBSTRUCTION 06/03/2010  . PSORIASIS 06/03/2010    [place] ,[place], CCC-SLP  04/08/2018, 4:52 PM  [facility] [address] [place], [place], [address] Phone: [phone]   Fax:  [phone]   Name: BRITTAIN HOSIE MRN: [id number] Date of Birth: 1945-10-02

## 2018-04-08 NOTE — Patient Instructions (Signed)
  Please complete the assigned speech therapy homework prior to your next session and return it to the speech therapist at your next visit.  

## 2018-04-12 ENCOUNTER — Ambulatory Visit: Payer: PPO | Admitting: Speech Pathology

## 2018-04-12 DIAGNOSIS — M9901 Segmental and somatic dysfunction of cervical region: Secondary | ICD-10-CM | POA: Diagnosis not present

## 2018-04-12 DIAGNOSIS — M47816 Spondylosis without myelopathy or radiculopathy, lumbar region: Secondary | ICD-10-CM | POA: Diagnosis not present

## 2018-04-12 DIAGNOSIS — R471 Dysarthria and anarthria: Secondary | ICD-10-CM

## 2018-04-12 DIAGNOSIS — M542 Cervicalgia: Secondary | ICD-10-CM | POA: Diagnosis not present

## 2018-04-12 DIAGNOSIS — M62830 Muscle spasm of back: Secondary | ICD-10-CM | POA: Diagnosis not present

## 2018-04-12 NOTE — Therapy (Signed)
[facility] [address] [place], [place], [address] Phone: [phone]   Fax:  [phone]  Speech Language Pathology Treatment  Patient Details  Name: Stanley Wright MRN: [id number] Date of Birth: May 22, 1946 Referring Provider (SLP): Neldon Newport., MD   Encounter Date: 04/12/2018  End of Session - 04/12/18 0840    Visit Number  14    Number of Visits  17    Date for SLP Re-Evaluation  04/30/18    SLP Start Time  0804    SLP Stop Time   0845    SLP Time Calculation (min)  41 min    Activity Tolerance  Patient tolerated treatment well       Past Medical History:  Diagnosis Date  . Arthritis   . BENIGN PROSTATIC HYPERTROPHY, WITH OBSTRUCTION   . Cancer (HCC)    skin, basal, squamous  . COLONIC POLYPS   . Diverticulitis   . DVT (deep venous thrombosis) ([place]) 2000  . Dysrhythmia    Hx Afib- 2017  . Factor 5 Leiden mutation, heterozygous ([place])   . GERD (gastroesophageal reflux disease)    not on medication  . HIATAL HERNIA   . ITP (idiopathic thrombocytopenic purpura) 2003  . Long term current use of anticoagulant   . Parkinson's disease ([place]) 02/03/2018  . PSORIASIS   . Pulmonary embolus ([place]) 2000  . Shortness of breath dyspnea    at times - Talking alot  . Sleep apnea     Past Surgical History:  Procedure Laterality Date  . CARDIOVERSION N/A 11/22/2015   Procedure: CARDIOVERSION;  Surgeon: Sanda Klein, MD;  [facility];  Service: Cardiovascular;  Laterality: N/A;  . COLONOSCOPY    . HERNIA REPAIR Left    Inguinal- x2 . mesh x1  . INSERTION OF MESH N/A 02/25/2016   Procedure: INSERTION OF MESH;  Surgeon: Rolm Bookbinder, MD;  Location: [facility];  Service: General;  Laterality: N/A;  . LUNG REMOVAL, PARTIAL Right 2004   was fungal and not cancerous  . PROSTATE SURGERY    . UMBILICAL HERNIA REPAIR N/A 02/25/2016   Procedure: LAPAROSCOPIC UMBILICAL HERNIA REPAIR;  Surgeon: Rolm Bookbinder, MD;  Location: [facility];  Service: General;  Laterality: N/A;    There were no vitals filed for this visit.  Subjective Assessment - 04/12/18 0809    Subjective  Pt self-corrected sub WNL greeting to SLP.    Currently in Pain?  No/denies            ADULT SLP TREATMENT - 04/12/18 0805      General Information   Behavior/Cognition  Alert;Cooperative;Pleasant mood      Treatment Provided   Treatment provided  Cognitive-Linquistic      Pain Assessment   Pain Assessment  No/denies pain      Cognitive-Linquistic Treatment   Treatment focused on  Dysarthria;Voice    Skilled Treatment  Pt told SLP he is having an "off" day due to not getting enough sleep and feeling tired. Reports he is having a hard time "getting enough air" to talk loud. SLP used loud "Hey" and /a/ to increase pt's effort and loudness in speech tasks. Sentence level responses averaged low 70s dB when allowing for self-correction (pt making 1-2 subsequent attempts when noting sub WNL loudness). In simple to mod-complex conversation, average was upper 60s dB with occasional non-verbal cues.        Assessment / Recommendations / Plan   Plan  Continue with  current plan of care      Progression Toward Goals   Progression toward goals  Progressing toward goals         SLP Short Term Goals - 04/12/18 0837      SLP SHORT TERM GOAL #1   Title  pt will produce loud /a/ at average low 90s dB over 4 sessions    Status  Partially Met      SLP SHORT TERM GOAL #2   Title  pt will generate 18/20 sentences with WNL average volume over 3 sessions    Status  Partially Met      SLP SHORT TERM GOAL #3   Title  pt will use abdominal breathing at rest 85% success over 2 sessions    Status  Achieved      SLP SHORT TERM GOAL #4   Title  In 7 minutes simple conversation pt will achieve average speech volume of low 70s dB over two sessions    Status  Not Met       SLP Long Term Goals - 04/12/18 0837      SLP  LONG TERM GOAL #1   Title  Pt will generate loud /a/ with average of low 90s dB over 6 sessions    Baseline  03-29-18, 04-01-18, 04-07-18, 04-08-18, 04-12-18    Time  1    Period  Weeks   or 17 visits, for all LTGs   Status  On-going      SLP LONG TERM GOAL #2   Title  pt will use abdominal breathing 70% of the time in 5 minutes simple-mod complex conversation     Time  1    Period  Weeks    Status  On-going      SLP LONG TERM GOAL #3   Title  In 10 minutes simple-mod complex conversation pt will achieve average speech volume of low 70s dB over three sessions    Time  1    Period  Weeks    Status  On-going      SLP LONG TERM GOAL #4   Title  pt will use abdominal breathing 80% of the time in 8 minutes simple conversation    Time  1    Period  Weeks    Status  On-going       Plan - 04/12/18 0944    Clinical Impression Statement  Pt strained/strangled voice occurred minimally today, and in familiar conversation and less-familar conversation pt maintained WNL volume for short periods (<10 minutes), though occasional non-verbal cues required. Pt awareness of sub-WNL vocal intensity improving, with pt attempting self-correction in sentence level tasks >90% of the time. Pt would continue to benefit from skilled ST focusing on pt's goal of improving commnicative ability with family and in the community.    Speech Therapy Frequency  2x / week    Duration  --   8 weeks or 17 visits   Treatment/Interventions  SLP instruction and feedback;Compensatory strategies;Patient/family education;Internal/external aids;Functional tasks;Cueing hierarchy;Environmental controls    Potential to Achieve Goals  Good    Potential Considerations  Ability to learn/carryover information       Patient will benefit from skilled therapeutic intervention in order to improve the following deficits and impairments:   Dysarthria and anarthria    Problem List Patient Active Problem List   Diagnosis Date Noted   . Parkinson's disease ([facility]) 02/03/2018  . Community acquired pneumonia of left lower lobe of lung ([facility]) 12/23/2017  .  Sepsis ([place]) 12/23/2017  . Morbid obesity due to excess calories ([facility]) 08/23/2016  . OSA on CPAP 08/23/2016  . Dyspnea on exertion 08/22/2016  . Umbilical hernia 74/60/0298  . Persistent atrial fibrillation   . Encounter for therapeutic drug monitoring 08/12/2013  . Long term current use of anticoagulant 07/30/2010  . COLONIC POLYPS 06/03/2010  . 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 GERD 06/03/2010  . HIATAL HERNIA 06/03/2010  . BENIGN PROSTATIC HYPERTROPHY, WITH OBSTRUCTION 06/03/2010  . PSORIASIS 06/03/2010   Deneise Lever, [place], [place] Speech-Language Pathologist  Aliene Altes 04/12/2018, 9:48 AM  [facility] [address] [place], [place], [address] Phone: [phone]   Fax:  [phone]   Name: Stanley Wright MRN: [id number] Date of Birth: [birth year]

## 2018-04-15 ENCOUNTER — Ambulatory Visit: Payer: PPO | Admitting: Speech Pathology

## 2018-04-15 DIAGNOSIS — R471 Dysarthria and anarthria: Secondary | ICD-10-CM | POA: Diagnosis not present

## 2018-04-15 NOTE — Therapy (Signed)
[facility] [address] [place], [place], [address] Phone: [phone]   Fax:  [phone]  Speech Language Pathology Treatment  Patient Details  Name: Stanley Wright MRN: [id number] Date of Birth: December 29, 1945 Referring Provider (SLP): Neldon Newport., MD   Encounter Date: 04/15/2018  End of Session - 04/15/18 1003    Visit Number  15    Number of Visits  17    Date for SLP Re-Evaluation  04/30/18    SLP Start Time  0805    SLP Stop Time   0845    SLP Time Calculation (min)  40 min    Activity Tolerance  Patient tolerated treatment well       Past Medical History:  Diagnosis Date  . Arthritis   . BENIGN PROSTATIC HYPERTROPHY, WITH OBSTRUCTION   . Cancer (HCC)    skin, basal, squamous  . COLONIC POLYPS   . Diverticulitis   . DVT (deep venous thrombosis) ([facility]) 2000  . Dysrhythmia    Hx Afib- 2017  . Factor 5 Leiden mutation, heterozygous ([place])   . GERD (gastroesophageal reflux disease)    not on medication  . HIATAL HERNIA   . ITP (idiopathic thrombocytopenic purpura) 2003  . Long term current use of anticoagulant   . Parkinson's disease ([facility]) 02/03/2018  . PSORIASIS   . Pulmonary embolus ([place] [facility]) 2000  . Shortness of breath dyspnea    at times - Talking alot  . Sleep apnea     Past Surgical History:  Procedure Laterality Date  . CARDIOVERSION N/A 11/22/2015   Procedure: CARDIOVERSION;  Surgeon: Sanda Klein, MD;  [facility];  Service: Cardiovascular;  Laterality: N/A;  . COLONOSCOPY    . HERNIA REPAIR Left    Inguinal- x2 . mesh x1  . INSERTION OF MESH N/A 02/25/2016   Procedure: INSERTION OF MESH;  Surgeon: Rolm Bookbinder, MD;  Location: [facility];  Service: General;  Laterality: N/A;  . LUNG REMOVAL, PARTIAL Right 2004   was fungal and not cancerous  . PROSTATE SURGERY    . UMBILICAL HERNIA REPAIR N/A 02/25/2016   Procedure: LAPAROSCOPIC UMBILICAL HERNIA REPAIR;  Surgeon: Rolm Bookbinder, MD;  Location: [facility];  Service: General;  Laterality: N/A;    There were no vitals filed for this visit.  Subjective Assessment - 04/15/18 0812    Subjective  "I have an agenda today." WNL vocal intensity    Currently in Pain?  No/denies            ADULT SLP TREATMENT - 04/15/18 0805      General Information   Behavior/Cognition  Alert;Cooperative;Pleasant mood      Treatment Provided   Treatment provided  Cognitive-Linquistic      Pain Assessment   Pain Assessment  No/denies pain      Cognitive-Linquistic Treatment   Treatment focused on  Dysarthria;Voice    Skilled Treatment  Pt entered [facility] room conversing with SLP in WNL vocal intensity. In 15 minutes simple-mod complex conversation about a familiar topic, pt maintained average in low 70s dB; occasional non-verbal cues required in final 5 min of conversation. SLP had pt perform loud /a/ prior to moving to louder treatment area to emphasize level of effort needed to maintain adequate volume in louder environment (average 92 dB, good vocal quality). In simple conversation and spontaneous sentence-level responses in mod noisy environment, pt required usual gestural cues, and occasional verbal cues to increase effort and for abdominal breathing.  Assessment / Recommendations / Plan   Plan  Continue with current plan of care      Progression Toward Goals   Progression toward goals  Progressing toward goals       SLP Education - 04/15/18 1002    Education Details  perceived effort level with /a/: maintain in conversation    Person(s) Educated  Patient    Methods  Explanation    Comprehension  Verbalized understanding;Verbal cues required;Need further instruction       SLP Short Term Goals - 04/15/18 6256      SLP SHORT TERM GOAL #1   Title  pt will produce loud /a/ at average low 90s dB over 4 sessions    Status  Partially Met      SLP SHORT TERM GOAL #2   Title  pt will generate 18/20 sentences with  WNL average volume over 3 sessions    Status  Partially Met      SLP SHORT TERM GOAL #3   Title  pt will use abdominal breathing at rest 85% success over 2 sessions    Status  Achieved      SLP SHORT TERM GOAL #4   Title  In 7 minutes simple conversation pt will achieve average speech volume of low 70s dB over two sessions    Status  Not Met       SLP Long Term Goals - 04/15/18 0823      SLP LONG TERM GOAL #1   Title  Pt will generate loud /a/ with average of low 90s dB over 6 sessions    Baseline  03-29-18, 04-01-18, 04-07-18, 04-08-18, 04-12-18, 10/31    Time  1    Period  --   or 17 visits, for all LTGs   Status  Achieved      SLP LONG TERM GOAL #2   Title  pt will use abdominal breathing 70% of the time in 5 minutes simple-mod complex conversation     Time  1    Period  Weeks    Status  Achieved      SLP LONG TERM GOAL #3   Title  In 10 minutes simple-mod complex conversation pt will achieve average speech volume of low 70s dB over three sessions    Baseline  04/15/18    Time  1    Period  Weeks    Status  On-going      SLP LONG TERM GOAL #4   Title  pt will use abdominal breathing 80% of the time in 8 minutes simple conversation    Time  1    Period  Weeks    Status  On-going       Plan - 04/15/18 1003    Clinical Impression Statement  Pt strained/strangled voice occurred minimally today, and in familiar conversation and less-familar conversation pt maintained WNL volume for short periods (15 minutes for familiar topic) though occasional non-verbal cues required. Cues required more frequently for carryover outside of ST room. Pt would continue to benefit from skilled ST focusing on pt's goal of improving commnicative ability with family and in the community.    Speech Therapy Frequency  2x / week    Duration  --   8 weeks or 17 visits   Treatment/Interventions  SLP instruction and feedback;Compensatory strategies;Patient/family education;Internal/external  aids;Functional tasks;Cueing hierarchy;Environmental controls    Potential to Achieve Goals  Good    Potential Considerations  Ability to learn/carryover information  Patient will benefit from skilled therapeutic intervention in order to improve the following deficits and impairments:   Dysarthria and anarthria    Problem List Patient Active Problem List   Diagnosis Date Noted  . Parkinson's disease ([facility]) 02/03/2018  . Community acquired pneumonia of left lower lobe of lung ([facility]) 12/23/2017  . Sepsis ([facility]) 12/23/2017  . Morbid obesity due to excess calories ([facility]) 08/23/2016  . OSA on CPAP 08/23/2016  . Dyspnea on exertion 08/22/2016  . Umbilical hernia 41/08/129  . Persistent atrial fibrillation   . Encounter for therapeutic drug monitoring 08/12/2013  . Long term current use of anticoagulant 07/30/2010  . COLONIC POLYPS 06/03/2010  . 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 GERD 06/03/2010  . HIATAL HERNIA 06/03/2010  . BENIGN PROSTATIC HYPERTROPHY, WITH OBSTRUCTION 06/03/2010  . PSORIASIS 06/03/2010   Deneise Lever, [place], [place] 04/15/2018, 10:08 AM  [facility] [address] [place], [place], [address] Phone: [phone]   Fax:  [phone]   Name: Stanley Wright MRN: [id number] Date of Birth: Jun 20, 1945

## 2018-04-19 DIAGNOSIS — M9901 Segmental and somatic dysfunction of cervical region: Secondary | ICD-10-CM | POA: Diagnosis not present

## 2018-04-19 DIAGNOSIS — M47816 Spondylosis without myelopathy or radiculopathy, lumbar region: Secondary | ICD-10-CM | POA: Diagnosis not present

## 2018-04-19 DIAGNOSIS — M62830 Muscle spasm of back: Secondary | ICD-10-CM | POA: Diagnosis not present

## 2018-04-19 DIAGNOSIS — M542 Cervicalgia: Secondary | ICD-10-CM | POA: Diagnosis not present

## 2018-04-21 ENCOUNTER — Ambulatory Visit: Payer: PPO | Attending: Physician Assistant

## 2018-04-21 DIAGNOSIS — R471 Dysarthria and anarthria: Secondary | ICD-10-CM | POA: Diagnosis not present

## 2018-04-21 NOTE — Patient Instructions (Signed)
  Please complete the assigned speech therapy homework prior to your next session and return it to the speech therapist at your next visit.  

## 2018-04-21 NOTE — Therapy (Signed)
[facility] [address] [place], [place], [address] Phone: [phone]   Fax:  [phone]  Speech Language Pathology Treatment  Patient Details  Name: Stanley Wright MRN: [id number] Date of Birth: [birth year] Referring Provider (SLP): Neldon Newport., MD   Encounter Date: 04/21/2018  End of Session - 04/21/18 1106    Visit Number  16    Number of Visits  17    Date for SLP Re-Evaluation  04/30/18    SLP Start Time  0848    SLP Stop Time   0930    SLP Time Calculation (min)  42 min    Activity Tolerance  Patient tolerated treatment well       Past Medical History:  Diagnosis Date  . Arthritis   . BENIGN PROSTATIC HYPERTROPHY, WITH OBSTRUCTION   . Cancer (HCC)    skin, basal, squamous  . COLONIC POLYPS   . Diverticulitis   . DVT (deep venous thrombosis) ([facility]) 2000  . Dysrhythmia    Hx Afib- 2017  . Factor 5 Leiden mutation, heterozygous ([facility])   . GERD (gastroesophageal reflux disease)    not on medication  . HIATAL HERNIA   . ITP (idiopathic thrombocytopenic purpura) 2003  . Long term current use of anticoagulant   . Parkinson's disease ([facility]) 02/03/2018  . PSORIASIS   . Pulmonary embolus ([facility]) 2000  . Shortness of breath dyspnea    at times - Talking alot  . Sleep apnea     Past Surgical History:  Procedure Laterality Date  . CARDIOVERSION N/A 11/22/2015   Procedure: CARDIOVERSION;  Surgeon: Sanda Klein, MD;  [facility];  Service: Cardiovascular;  Laterality: N/A;  . COLONOSCOPY    . HERNIA REPAIR Left    Inguinal- x2 . mesh x1  . INSERTION OF MESH N/A 02/25/2016   Procedure: INSERTION OF MESH;  Surgeon: Rolm Bookbinder, MD;  Location: [facility];  Service: General;  Laterality: N/A;  . LUNG REMOVAL, PARTIAL Right 2004   was fungal and not cancerous  . PROSTATE SURGERY    . UMBILICAL HERNIA REPAIR N/A 02/25/2016   Procedure: LAPAROSCOPIC UMBILICAL HERNIA REPAIR;  Surgeon: Rolm Bookbinder, MD;  Location: [facility];  Service: General;  Laterality: N/A;    There were no vitals filed for this visit.  Subjective Assessment - 04/21/18 0853    Subjective  "I seem to have lost some of (the louder talking."    Currently in Pain?  No/denies            ADULT SLP TREATMENT - 04/21/18 0854      General Information   Behavior/Cognition  Alert;Cooperative;Pleasant mood      Treatment Provided   Treatment provided  Cognitive-Linquistic      Pain Assessment   Pain Assessment  No/denies pain      Cognitive-Linquistic Treatment   Treatment focused on  Dysarthria;Voice    Skilled Treatment  Pt entered ST room with WNL volume talking about how he is "having trouble today." Pt concerned with "not enough air", perseverates re: this today, in conversation of simple-mod complex topic/s for 7-8 minutes - average loudness upper 60s dB. SLP told pt to focus on "LOUD" and not breathing because pt focus will not be on louder speech. Prior to this pt reported focus was on breathing and he had greater frequency strained/strangled voice than with focus on "loud". Pt worked with sentence responses with "loud" as focus with average upper 60s-low 70s average loudness.  Pt cancelled tomorrow's session due to visiting his brother at the beach.        Assessment / Recommendations / Plan   Plan  Continue with current plan of care      Progression Toward Goals   Progression toward goals  Progressing toward goals       SLP Education - 04/21/18 1106    Education Details  focus on loud and not breathing    Person(s) Educated  Patient    Methods  Explanation;Demonstration;Verbal cues    Comprehension  Verbalized understanding       SLP Short Term Goals - 04/15/18 8144      SLP SHORT TERM GOAL #1   Title  pt will produce loud /a/ at average low 90s dB over 4 sessions    Status  Partially Met      SLP SHORT TERM GOAL #2   Title  pt will generate 18/20 sentences with WNL average volume  over 3 sessions    Status  Partially Met      SLP SHORT TERM GOAL #3   Title  pt will use abdominal breathing at rest 85% success over 2 sessions    Status  Achieved      SLP SHORT TERM GOAL #4   Title  In 7 minutes simple conversation pt will achieve average speech volume of low 70s dB over two sessions    Status  Not Met       SLP Long Term Goals - 04/21/18 1114      SLP LONG TERM GOAL #1   Title  Pt will generate loud /a/ with average of low 90s dB over 6 sessions    Period  --   or 17 visits, for all LTGs   Status  Achieved      SLP LONG TERM GOAL #2   Title  pt will use abdominal breathing 70% of the time in 5 minutes simple-mod complex conversation     Time  1    Period  Weeks    Status  Achieved      SLP LONG TERM GOAL #3   Title  In 10 minutes simple-mod complex conversation pt will achieve average speech volume of low 70s dB over three sessions    Baseline  04/15/18    Time  1    Period  Weeks    Status  On-going      SLP LONG TERM GOAL #4   Title  pt will use abdominal breathing 80% of the time in 8 minutes simple conversation    Time  1    Period  Weeks    Status  On-going       Plan - 04/21/18 1107    Clinical Impression Statement  Pt had incr'd difficulty maintaining WNL volume speech in structured tasks - stated he was concentrating on breathing and "getting more air." SLP educated/re-educated pt with thinking about "loud" (not "shout" due to strained/strangeld voice). Pt would continue to benefit from skilled ST focusing on pt's goal of improving commnicative ability with family and in the community.    Speech Therapy Frequency  2x / week    Duration  --   8 weeks or 17 visits   Treatment/Interventions  SLP instruction and feedback;Compensatory strategies;Patient/family education;Internal/external aids;Functional tasks;Cueing hierarchy;Environmental controls    Potential to Achieve Goals  Good    Potential Considerations  Ability to learn/carryover  information       Patient will benefit from skilled  therapeutic intervention in order to improve the following deficits and impairments:   Dysarthria and anarthria    Problem List Patient Active Problem List   Diagnosis Date Noted  . Parkinson's disease ([place]) 02/03/2018  . Community acquired pneumonia of left lower lobe of lung ([facility]) 12/23/2017  . Sepsis ([place]) 12/23/2017  . Morbid obesity due to excess calories ([place]) 08/23/2016  . OSA on CPAP 08/23/2016  . Dyspnea on exertion 08/22/2016  . Umbilical hernia 44/71/5806  . Persistent atrial fibrillation   . Encounter for therapeutic drug monitoring 08/12/2013  . Long term current use of anticoagulant 07/30/2010  . COLONIC POLYPS 06/03/2010  . 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 GERD 06/03/2010  . HIATAL HERNIA 06/03/2010  . BENIGN PROSTATIC HYPERTROPHY, WITH OBSTRUCTION 06/03/2010  . PSORIASIS 06/03/2010    Mattison Stuckey ,MS, CCC-SLP  04/21/2018, 11:14 AM  [facility] [address] [place], [place], [address] Phone: [phone]   Fax:  [phone]   Name: LEONE PUTMAN MRN: [id number] Date of Birth: April 15, 1946

## 2018-04-26 ENCOUNTER — Ambulatory Visit: Payer: PPO

## 2018-04-26 DIAGNOSIS — M9901 Segmental and somatic dysfunction of cervical region: Secondary | ICD-10-CM | POA: Diagnosis not present

## 2018-04-26 DIAGNOSIS — M47816 Spondylosis without myelopathy or radiculopathy, lumbar region: Secondary | ICD-10-CM | POA: Diagnosis not present

## 2018-04-26 DIAGNOSIS — R471 Dysarthria and anarthria: Secondary | ICD-10-CM | POA: Diagnosis not present

## 2018-04-26 DIAGNOSIS — M62830 Muscle spasm of back: Secondary | ICD-10-CM | POA: Diagnosis not present

## 2018-04-26 DIAGNOSIS — M542 Cervicalgia: Secondary | ICD-10-CM | POA: Diagnosis not present

## 2018-04-26 NOTE — Therapy (Signed)
[facility] [address] [place], [place], [address] Phone: [phone]   Fax:  [phone]  Speech Language Pathology Treatment  Patient Details  Name: Stanley Wright MRN: [id number] Date of Birth: Jun 29, 1945 Referring Provider (SLP): Neldon Newport., MD   Encounter Date: 04/26/2018  End of Session - 04/26/18 0904    Visit Number  17    Number of Visits  25   Date for SLP Re-Evaluation  06/11/18    SLP Start Time  0805    SLP Stop Time   0846    SLP Time Calculation (min)  41 min    Activity Tolerance  Patient tolerated treatment well       Past Medical History:  Diagnosis Date  . Arthritis   . BENIGN PROSTATIC HYPERTROPHY, WITH OBSTRUCTION   . Cancer (HCC)    skin, basal, squamous  . COLONIC POLYPS   . Diverticulitis   . DVT (deep venous thrombosis) ([place]) 2000  . Dysrhythmia    Hx Afib- 2017  . Factor 5 Leiden mutation, heterozygous ([place])   . GERD (gastroesophageal reflux disease)    not on medication  . HIATAL HERNIA   . ITP (idiopathic thrombocytopenic purpura) 2003  . Long term current use of anticoagulant   . Parkinson's disease ([place]) 02/03/2018  . PSORIASIS   . Pulmonary embolus ([place]) 2000  . Shortness of breath dyspnea    at times - Talking alot  . Sleep apnea     Past Surgical History:  Procedure Laterality Date  . CARDIOVERSION N/A 11/22/2015   Procedure: CARDIOVERSION;  Surgeon: Sanda Klein, MD;  [facility];  Service: Cardiovascular;  Laterality: N/A;  . COLONOSCOPY    . HERNIA REPAIR Left    Inguinal- x2 . mesh x1  . INSERTION OF MESH N/A 02/25/2016   Procedure: INSERTION OF MESH;  Surgeon: Rolm Bookbinder, MD;  Location: [facility];  Service: General;  Laterality: N/A;  . LUNG REMOVAL, PARTIAL Right 2004   was fungal and not cancerous  . PROSTATE SURGERY    . UMBILICAL HERNIA REPAIR N/A 02/25/2016   Procedure: LAPAROSCOPIC UMBILICAL HERNIA REPAIR;  Surgeon: Rolm Bookbinder, MD;  Location: [facility];  Service: General;  Laterality: N/A;    There were no vitals filed for this visit.  Subjective Assessment - 04/26/18 0814    Subjective  "Sunday school went well."    Currently in Pain?  No/denies            ADULT SLP TREATMENT - 04/26/18 0815      General Information   Behavior/Cognition  Alert;Pleasant mood;Cooperative      Treatment Provided   Treatment provided  Cognitive-Linquistic      Cognitive-Linquistic Treatment   Treatment focused on  Dysarthria;Voice    Skilled Treatment  Pt entered ST room with loudness WNL for first 5 minutes then decr'd slightly. Pt mentioned he wa snot getting enough air and SLP encouraged him to think "LOUD". Loud /a/ was used to recalibrate pt's loudness in conversation with average in upper 80s dB. AFterwards conversation was in upper 60s low 70s dB with SLP min cues occasionally for loudness. In two-three sentence responses focusing on louder, WNL speech, pt maintained volume in upper 60s low 70s independently. Short rushes of speech rarely noted but did not degrade intelligibilty of pt's message. Homework provided for 2-3 sentence responses in hopes pt will incr volumes to be consistently in lower 70s dB.  Assessment / Recommendations / Plan   Plan  Continue with current plan of care      Progression Toward Goals   Progression toward goals  Progressing toward goals   renewal sent today        SLP Short Term Goals - 04/15/18 1962      SLP SHORT TERM GOAL #1   Title  pt will produce loud /a/ at average low 90s dB over 4 sessions    Status  Partially Met      SLP SHORT TERM GOAL #2   Title  pt will generate 18/20 sentences with WNL average volume over 3 sessions    Status  Partially Met      SLP SHORT TERM GOAL #3   Title  pt will use abdominal breathing at rest 85% success over 2 sessions    Status  Achieved      SLP SHORT TERM GOAL #4   Title  In 7 minutes simple conversation pt will  achieve average speech volume of low 70s dB over two sessions    Status  Not Met       SLP Long Term Goals - 04/26/18 0908      SLP LONG TERM GOAL #1   Title  Pt will generate loud /a/ with average of low 90s dB over 6 sessions    Period  --   or 17 visits, for all LTGs   Status  Achieved      SLP LONG TERM GOAL #2   Title  pt will use abdominal breathing 70% of the time in 5 minutes simple-mod complex conversation     Status  Achieved      SLP LONG TERM GOAL #3   Title  In 10 minutes simple-mod complex conversation pt will achieve average speech volume of low 70s dB over four sessions    Baseline  04/15/18    Time  4    Period  Weeks   beginning 04-28-18, for all LTGs not achieved   Status  Revised      SLP LONG TERM GOAL #4   Title  pt will use abdominal breathing 80% of the time in 8 minutes simple conversation, over 3 sessions    Time  4    Period  Weeks    Status  On-going       Plan - 04/26/18 0905    Clinical Impression Statement  Pt presents today with cont'd suboptimal conversational voice volume in >90 seconds conversations- volume in mid to upper 60s db. This is an improvement, however, over his original state. With occasional nonverbal cues pt can maintain WNL volumes for extended conversation, however he requires more skilled ST to be able to gain consistency in using louder voice. Strained/strangled voice occurred minimally today. As stated prior, pt would continue to benefit from skilled ST focusing on pt's goal of improving commnicative ability with family and in the community.    Speech Therapy Frequency  2x / week    Duration  4 weeks   or 8 more visits   Treatment/Interventions  SLP instruction and feedback;Compensatory strategies;Patient/family education;Internal/external aids;Functional tasks;Cueing hierarchy;Environmental controls    Potential to Achieve Goals  Good    Potential Considerations  Severity of impairments    SLP Home Exercise Plan  provided     Consulted and Agree with Plan of Care  Patient       Patient will benefit from skilled therapeutic intervention in order to improve the following  deficits and impairments:   Dysarthria and anarthria - Plan: SLP plan of care cert/re-cert    Problem List Patient Active Problem List   Diagnosis Date Noted  . Parkinson's disease ([place]) 02/03/2018  . Community acquired pneumonia of left lower lobe of lung ([place]) 12/23/2017  . Sepsis ([place]) 12/23/2017  . Morbid obesity due to excess calories ([place]) 08/23/2016  . OSA on CPAP 08/23/2016  . Dyspnea on exertion 08/22/2016  . Umbilical hernia 73/42/8768  . Persistent atrial fibrillation   . Encounter for therapeutic drug monitoring 08/12/2013  . Long term current use of anticoagulant 07/30/2010  . COLONIC POLYPS 06/03/2010  . 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 GERD 06/03/2010  . HIATAL HERNIA 06/03/2010  . BENIGN PROSTATIC HYPERTROPHY, WITH OBSTRUCTION 06/03/2010  . PSORIASIS 06/03/2010    Diahann Guajardo ,[place], CCC-SLP  04/26/2018, 9:11 AM  [facility] [address] [place], [place], [address] Phone: [phone]   Fax:  [phone]   Name: Stanley Wright MRN: [id number] Date of Birth: [birth year]

## 2018-04-28 ENCOUNTER — Ambulatory Visit: Payer: PPO

## 2018-04-29 DIAGNOSIS — M62830 Muscle spasm of back: Secondary | ICD-10-CM | POA: Diagnosis not present

## 2018-04-29 DIAGNOSIS — M9901 Segmental and somatic dysfunction of cervical region: Secondary | ICD-10-CM | POA: Diagnosis not present

## 2018-04-29 DIAGNOSIS — M47816 Spondylosis without myelopathy or radiculopathy, lumbar region: Secondary | ICD-10-CM | POA: Diagnosis not present

## 2018-04-29 DIAGNOSIS — M542 Cervicalgia: Secondary | ICD-10-CM | POA: Diagnosis not present

## 2018-05-03 ENCOUNTER — Ambulatory Visit: Payer: PPO

## 2018-05-03 DIAGNOSIS — M9901 Segmental and somatic dysfunction of cervical region: Secondary | ICD-10-CM | POA: Diagnosis not present

## 2018-05-03 DIAGNOSIS — M62830 Muscle spasm of back: Secondary | ICD-10-CM | POA: Diagnosis not present

## 2018-05-03 DIAGNOSIS — M47816 Spondylosis without myelopathy or radiculopathy, lumbar region: Secondary | ICD-10-CM | POA: Diagnosis not present

## 2018-05-03 DIAGNOSIS — R471 Dysarthria and anarthria: Secondary | ICD-10-CM | POA: Diagnosis not present

## 2018-05-03 DIAGNOSIS — M542 Cervicalgia: Secondary | ICD-10-CM | POA: Diagnosis not present

## 2018-05-03 NOTE — Therapy (Signed)
[facility] [address] [place], [place], [address] Phone: [phone]   Fax:  [phone]  Speech Language Pathology Treatment  Patient Details  Name: Stanley Wright MRN: [id number] Date of Birth: 11-03-1945 Referring Provider (SLP): Neldon Newport., MD   Encounter Date: 05/03/2018  End of Session - 05/03/18 1007    Visit Number  18    Number of Visits  25    Date for SLP Re-Evaluation  06/11/18    SLP Start Time  0805    SLP Stop Time   0846    SLP Time Calculation (min)  41 min    Activity Tolerance  Patient tolerated treatment well       Past Medical History:  Diagnosis Date  . Arthritis   . BENIGN PROSTATIC HYPERTROPHY, WITH OBSTRUCTION   . Cancer (HCC)    skin, basal, squamous  . COLONIC POLYPS   . Diverticulitis   . DVT (deep venous thrombosis) ([facility]) 2000  . Dysrhythmia    Hx Afib- 2017  . Factor 5 Leiden mutation, heterozygous ([place])   . GERD (gastroesophageal reflux disease)    not on medication  . HIATAL HERNIA   . ITP (idiopathic thrombocytopenic purpura) 2003  . Long term current use of anticoagulant   . Parkinson's disease ([place] [facility]) 02/03/2018  . PSORIASIS   . Pulmonary embolus ([place]) 2000  . Shortness of breath dyspnea    at times - Talking alot  . Sleep apnea     Past Surgical History:  Procedure Laterality Date  . CARDIOVERSION N/A 11/22/2015   Procedure: CARDIOVERSION;  Surgeon: Sanda Klein, MD;  [facility];  Service: Cardiovascular;  Laterality: N/A;  . COLONOSCOPY    . HERNIA REPAIR Left    Inguinal- x2 . mesh x1  . INSERTION OF MESH N/A 02/25/2016   Procedure: INSERTION OF MESH;  Surgeon: Rolm Bookbinder, MD;  Location: [facility];  Service: General;  Laterality: N/A;  . LUNG REMOVAL, PARTIAL Right 2004   was fungal and not cancerous  . PROSTATE SURGERY    . UMBILICAL HERNIA REPAIR N/A 02/25/2016   Procedure: LAPAROSCOPIC UMBILICAL HERNIA REPAIR;  Surgeon: Rolm Bookbinder, MD;  Location: [facility];  Service: General;  Laterality: N/A;    There were no vitals filed for this visit.  Subjective Assessment - 05/03/18 0811    Subjective  "I made a decision last week. This will be our last session. I know what I have to do I just have to do it now."    Currently in Pain?  No/denies            ADULT SLP TREATMENT - 05/03/18 0812      General Information   Behavior/Cognition  Alert;Pleasant mood;Cooperative      Treatment Provided   Treatment provided  Cognitive-Linquistic      Cognitive-Linquistic Treatment   Treatment focused on  Dysarthria    Skilled Treatment  Pt appeared disappointed in his speech in the last few days, more about "I can't get my breath." SLP encouraged pt to focus on "loud" and to feel strength in his voice come from the abdomen and not from laryngeal/thyroid area. Since pt has chosen this as his last visit today, SLP re-educated pt on a focus of "LOUD" and of abdominal movement as the basis of loudness. In conversation regarding this topic pt req'd occasional min-mod cues from SLP for relaxed instead of tight/stranied voice and focus on abdominal breathing with LOUD focus.  Assessment / Recommendations / Plan   Plan  Continue with current plan of care;Discharge SLP treatment due to (comment)   pt request      Progression Toward Goals   Progression toward goals  --   pt would like to d/c today - see goal update      SLP Education - 05/03/18 1006    Education Details  Focus efforts on LOUD and not breathing, focus on feeling abdominal movement and not tight throat    Person(s) Educated  Patient    Methods  Explanation;Demonstration    Comprehension  Verbalized understanding;Returned demonstration;Need further instruction       SLP Short Term Goals - 04/15/18 3716      SLP SHORT TERM GOAL #1   Title  pt will produce loud /a/ at average low 90s dB over 4 sessions    Status  Partially Met      SLP SHORT TERM  GOAL #2   Title  pt will generate 18/20 sentences with WNL average volume over 3 sessions    Status  Partially Met      SLP SHORT TERM GOAL #3   Title  pt will use abdominal breathing at rest 85% success over 2 sessions    Status  Achieved      SLP SHORT TERM GOAL #4   Title  In 7 minutes simple conversation pt will achieve average speech volume of low 70s dB over two sessions    Status  Not Met       SLP Long Term Goals - 05/03/18 1659      SLP LONG TERM GOAL #1   Title  Pt will generate loud /a/ with average of low 90s dB over 6 sessions    Period  --   or 17 visits, for all LTGs   Status  Achieved      SLP LONG TERM GOAL #2   Title  pt will use abdominal breathing 70% of the time in 5 minutes simple-mod complex conversation     Status  Achieved      SLP LONG TERM GOAL #3   Title  In 10 minutes simple-mod complex conversation pt will achieve average speech volume of low 70s dB over four sessions    Status  Partially Met      SLP LONG TERM GOAL #4   Title  pt will use abdominal breathing 80% of the time in 8 minutes simple conversation, over 3 sessions    Status  Not Met       Plan - 05/03/18 1007    Clinical Impression Statement  Pt presents today with cont'd suboptimal conversational voice volume in conversations- volume continues in mid to upper 60s db. This is an improvement, however, over his original state. With occasional nonverbal cues pt can maintain WNL volumes for extended conversation, however he exhibits strained/strangled/tight voice occasionally, which SLP believes pt calls "losing (his) breath" or "not getting enough air". Pt would continue to benefit from skilled ST focusing on pt's goal of improving commnicative ability with family and in the community however he would like to d/c at this time stating he "just needs to (use louder speech).".    Speech Therapy Frequency  --    Duration  --   or 8 more visits   Treatment/Interventions  SLP instruction and  feedback;Compensatory strategies;Patient/family education;Internal/external aids;Functional tasks;Cueing hierarchy;Environmental controls    Potential to Achieve Goals  Good    Potential Considerations  Severity of impairments  SLP Home Exercise Plan  provided    Consulted and Agree with Plan of Care  Patient       Patient will benefit from skilled therapeutic intervention in order to improve the following deficits and impairments:   Dysarthria and anarthria   SPEECH THERAPY DISCHARGE SUMMARY  Visits from Start of Care: 18  Current functional level related to goals / functional outcomes: Pt remarked today that he has "learned a lot and I'm talking louder than I was when I first came". However, SLP would have liked to have had pt continue in ST to master louder speech and to reduce strained/strangeld voice.   Remaining deficits: Mild-mod dysarthria.   Education / Equipment: HEP (loud /a/), LOUD speech, focus on abdominal breathing and not pressing his voice.  Plan: Patient agrees to discharge.  Patient goals were partially met. Patient is being discharged due to being pleased with the current functional level.  ?????       Problem List Patient Active Problem List   Diagnosis Date Noted  . Parkinson's disease ([facility]) 02/03/2018  . Community acquired pneumonia of left lower lobe of lung ([place]) 12/23/2017  . Sepsis ([place]) 12/23/2017  . Morbid obesity due to excess calories ([place]) 08/23/2016  . OSA on CPAP 08/23/2016  . Dyspnea on exertion 08/22/2016  . Umbilical hernia 51/03/2110  . Persistent atrial fibrillation   . Encounter for therapeutic drug monitoring 08/12/2013  . Long term current use of anticoagulant 07/30/2010  . COLONIC POLYPS 06/03/2010  . 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 GERD 06/03/2010  . HIATAL HERNIA 06/03/2010  . BENIGN PROSTATIC HYPERTROPHY, WITH OBSTRUCTION 06/03/2010  . PSORIASIS 06/03/2010    [facility] 05/03/2018, 5:00 PM  [facility] [address] [place], [place], [address] Phone: [phone]   Fax:  [phone]   Name: JEFFRE ENRIQUES MRN: [id number] Date of Birth: [birth year]

## 2018-05-05 ENCOUNTER — Ambulatory Visit: Payer: PPO

## 2018-05-07 ENCOUNTER — Ambulatory Visit: Payer: PPO | Admitting: *Deleted

## 2018-05-07 DIAGNOSIS — Z5181 Encounter for therapeutic drug level monitoring: Secondary | ICD-10-CM

## 2018-05-07 DIAGNOSIS — I4819 Other persistent atrial fibrillation: Secondary | ICD-10-CM | POA: Diagnosis not present

## 2018-05-07 LAB — POCT INR: INR: 2.2 (ref 2.0–3.0)

## 2018-05-07 NOTE — Patient Instructions (Signed)
Description   Continue same dosage of 1 tablet every day. Recheck INR in 6 weeks. Call with any questions if gets any new medications or scheduled for any procedures  [phone]    

## 2018-05-10 ENCOUNTER — Encounter: Payer: PPO | Admitting: Speech Pathology

## 2018-05-10 DIAGNOSIS — M47816 Spondylosis without myelopathy or radiculopathy, lumbar region: Secondary | ICD-10-CM | POA: Diagnosis not present

## 2018-05-10 DIAGNOSIS — M542 Cervicalgia: Secondary | ICD-10-CM | POA: Diagnosis not present

## 2018-05-10 DIAGNOSIS — M9901 Segmental and somatic dysfunction of cervical region: Secondary | ICD-10-CM | POA: Diagnosis not present

## 2018-05-10 DIAGNOSIS — M62830 Muscle spasm of back: Secondary | ICD-10-CM | POA: Diagnosis not present

## 2018-05-17 ENCOUNTER — Telehealth: Payer: Self-pay | Admitting: Family Medicine

## 2018-05-17 NOTE — Telephone Encounter (Signed)
Called patient needing to know where he had his diabetic eye exam at so we can obtain those records. The VM was full so couldn't leave a voicemail.

## 2018-05-25 DIAGNOSIS — H35033 Hypertensive retinopathy, bilateral: Secondary | ICD-10-CM | POA: Diagnosis not present

## 2018-05-25 DIAGNOSIS — H25013 Cortical age-related cataract, bilateral: Secondary | ICD-10-CM | POA: Diagnosis not present

## 2018-05-25 DIAGNOSIS — H35363 Drusen (degenerative) of macula, bilateral: Secondary | ICD-10-CM | POA: Diagnosis not present

## 2018-05-25 DIAGNOSIS — H2513 Age-related nuclear cataract, bilateral: Secondary | ICD-10-CM | POA: Diagnosis not present

## 2018-05-25 DIAGNOSIS — I708 Atherosclerosis of other arteries: Secondary | ICD-10-CM | POA: Diagnosis not present

## 2018-05-26 ENCOUNTER — Other Ambulatory Visit: Payer: Self-pay | Admitting: Cardiology

## 2018-06-02 ENCOUNTER — Ambulatory Visit: Payer: PPO | Admitting: *Deleted

## 2018-06-02 DIAGNOSIS — Z5181 Encounter for therapeutic drug level monitoring: Secondary | ICD-10-CM | POA: Diagnosis not present

## 2018-06-02 DIAGNOSIS — I4819 Other persistent atrial fibrillation: Secondary | ICD-10-CM | POA: Diagnosis not present

## 2018-06-02 LAB — POCT INR: INR: 2.3 (ref 2.0–3.0)

## 2018-06-02 NOTE — Patient Instructions (Signed)
Description   Continue same dosage of 1 tablet every day. Recheck INR in 6 weeks. Call with any questions if gets any new medications or scheduled for any procedures  [phone]    

## 2018-06-11 ENCOUNTER — Telehealth: Payer: Self-pay | Admitting: *Deleted

## 2018-06-11 NOTE — Telephone Encounter (Signed)
   [facility] Pre-operative Risk Assessment    Request for surgical clearance:  1. What type of surgery is being performed? 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 When is this surgery scheduled? 06/21/18   3. What type of clearance is required (medical clearance vs. Pharmacy clearance to hold med vs. Both)? both  4. Are there any medications that need to be held prior to surgery and how long? coumadin   5. Practice name and name of physician performing surgery? [facility] Dr. Lorina Rabon   6. What is your office phone number [phone]    7.   What is your office fax number [phone]  8.   Anesthesia type (None, local, MAC, general) ? MAC   Yavuz Kirby A Aleysia Oltmann 06/11/2018, 4:10 PM  _________________________________________________________________   (provider comments below)

## 2018-06-14 NOTE — Telephone Encounter (Signed)
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 He will require bridging with Lovenox in order to hold warfarin for 5 days for upcoming procedure.   Procedure is in 1 week, request received at the end of Monday and we are closed Wednesday for New Years, pt will need to come in on Tuesday for Lovenox bridge. Attempted to call pt however was forwarded to voicemail. I left a message for pt to return call, however if we do not get in touch with him by tomorrow, procedure date may need to be moved as coordinating Lovenox bridge on Thursday will be too late.

## 2018-06-14 NOTE — Telephone Encounter (Signed)
Pt scheduled for Lovenox bridge tomorrow 12/31.

## 2018-06-14 NOTE — Telephone Encounter (Signed)
Will ask for pharmacy input on Coumadin- then contact patient.   Kerin Ransom PA-C 06/14/2018 3:00 PM

## 2018-06-14 NOTE — Telephone Encounter (Signed)
   Primary Cardiologist: Kirk Ruths, MD  Chart reviewed and patient interviewed over the phone today as part of pre-operative protocol coverage. Given past medical history and time since last visit, based on ACC/AHA guidelines, Stanley Wright would be at acceptable risk for the planned procedure without further cardiovascular testing.  He will need Coumadin to Lovenox crossover.  I have asked him to contact the [facility] about this.    I will route this recommendation to the requesting party via Epic fax function and remove from pre-op pool.  Please call with questions.  Kerin Ransom, PA-C 06/14/2018, 3:44 PM

## 2018-06-15 ENCOUNTER — Ambulatory Visit: Payer: PPO | Admitting: *Deleted

## 2018-06-15 DIAGNOSIS — Z5181 Encounter for therapeutic drug level monitoring: Secondary | ICD-10-CM | POA: Diagnosis not present

## 2018-06-15 DIAGNOSIS — I4819 Other persistent atrial fibrillation: Secondary | ICD-10-CM

## 2018-06-15 LAB — POCT INR: INR: 2.4 (ref 2.0–3.0)

## 2018-06-15 MED ORDER — ENOXAPARIN SODIUM 120 MG/0.8ML ~~LOC~~ SOLN: 120.0000 mg | Freq: Two times a day (BID) | SUBCUTANEOUS | 1 refills | Status: DC

## 2018-06-15 NOTE — Patient Instructions (Signed)
06/15/18- Last dose of Coumadin.  06/16/18- No Coumadin or Lovenox.  06/17/18- Inject Lovenox 120mg  in the fatty abdominal tissue at least 2 inches from the belly button twice a day about 12 hours apart, 8am and 8pm rotate sites. No Coumadin.  06/18/18- Inject Lovenox in the fatty tissue every 12 hours, 8am and 8pm. No Coumadin.  06/19/18- Inject Lovenox in the fatty tissue every 12 hours, 8am and 8pm. No Coumadin.  06/20/18- Inject Lovenox in the fatty tissue in the morning at 8 am (No PM dose). No Coumadin.  06/21/18- Procedure Day - No Lovenox - Resume Coumadin in the evening or as directed by doctor (take an extra half tablet with usual dose for 2 days then resume normal dose).  06/22/18- Resume Lovenox inject in the fatty tissue every 12 hours and take Coumadin.  06/23/18- Inject Lovenox in the fatty tissue every 12 hours and take Coumadin.  06/24/18- Inject Lovenox in the fatty tissue every 12 hours and take Coumadin.  06/25/18- Inject Lovenox in the fatty tissue every 12 hours and take Coumadin.  06/26/18- Inject Lovenox in the fatty tissue every 12 hours and take Coumadin.  06/27/18- Inject Lovenox in the fatty tissue every 12 hours and take Coumadin.  06/28/18-Coumadin appt to check INR.

## 2018-06-21 DIAGNOSIS — H57813 Brow ptosis, bilateral: Secondary | ICD-10-CM | POA: Diagnosis not present

## 2018-06-21 DIAGNOSIS — H02831 Dermatochalasis of right upper eyelid: Secondary | ICD-10-CM | POA: Diagnosis not present

## 2018-06-21 DIAGNOSIS — M4692 Unspecified inflammatory spondylopathy, cervical region: Secondary | ICD-10-CM | POA: Diagnosis not present

## 2018-06-21 DIAGNOSIS — H0279 Other degenerative disorders of eyelid and periocular area: Secondary | ICD-10-CM | POA: Diagnosis not present

## 2018-06-21 DIAGNOSIS — H02834 Dermatochalasis of left upper eyelid: Secondary | ICD-10-CM | POA: Diagnosis not present

## 2018-06-21 DIAGNOSIS — H02423 Myogenic ptosis of bilateral eyelids: Secondary | ICD-10-CM | POA: Diagnosis not present

## 2018-06-21 DIAGNOSIS — H02413 Mechanical ptosis of bilateral eyelids: Secondary | ICD-10-CM | POA: Diagnosis not present

## 2018-06-21 DIAGNOSIS — H53483 Generalized contraction of visual field, bilateral: Secondary | ICD-10-CM | POA: Diagnosis not present

## 2018-06-24 ENCOUNTER — Telehealth: Payer: Self-pay | Admitting: Physical Therapy

## 2018-06-24 LAB — MISC LABCORP TEST (SEND OUT): LABCORP TEST CODE: 819213

## 2018-06-24 NOTE — Telephone Encounter (Signed)
Stanley Wright is scheduled for a follow-up PT evaluation later this month, which he agreed to upon his previous discharge.  If you agree, could you please write order for PT in Epic?  Thank you.  Mady Haagensen, PT Adolpho Meenach [place], PT 06/24/18 11:29 AM Phone: [phone] Fax: [phone]

## 2018-06-28 ENCOUNTER — Ambulatory Visit: Payer: PPO | Admitting: *Deleted

## 2018-06-28 DIAGNOSIS — I4819 Other persistent atrial fibrillation: Secondary | ICD-10-CM | POA: Diagnosis not present

## 2018-06-28 DIAGNOSIS — Z5181 Encounter for therapeutic drug level monitoring: Secondary | ICD-10-CM | POA: Diagnosis not present

## 2018-06-28 LAB — POCT INR: INR: 1.9 — AB (ref 2.0–3.0)

## 2018-06-28 NOTE — Patient Instructions (Addendum)
Description   Continue Lovenox and complete tomorrow. Today take 1.5 tablets of Coumadin then continue same dosage of 1 tablet every day. Recheck INR in 2 weeks. Call with any questions if gets any new medications or scheduled for any procedures  [phone]

## 2018-06-29 ENCOUNTER — Telehealth: Payer: Self-pay | Admitting: Neurology

## 2018-06-29 MED ORDER — CARBIDOPA-LEVODOPA 25-100 MG PO TABS: 1.0000 | ORAL_TABLET | Freq: Three times a day (TID) | ORAL | 3 refills | Status: DC

## 2018-06-29 NOTE — Telephone Encounter (Signed)
Spoke with patient and reminded he needs to stay off Levodopa 24 hours prior to appt Friday, but I did send refill to the pharmacy.

## 2018-06-29 NOTE — Telephone Encounter (Signed)
Patient is calling in about carbidopa levodopa medication running out before he gets here on Friday. I verified pharm on file is correct. Please send in or call him at [phone]. Thanks!

## 2018-06-30 NOTE — Progress Notes (Signed)
Stanley Wright was seen today in the movement disorders clinic for neurologic consultation at the request of Stanley Wright, Stanley Wright, Stanley Wright.  The consultation is for the evaluation of PD.  Pt previously seen by Dr. Jannifer Franklin but only seen one time for consultation.  The records that were made available to me were reviewed.  Pt saw Dr. Jannifer Franklin on 02/03/18 for shuffling gait and balance change.  Patient reports that for symptom was speech change/loss of volume and that was about 3 years ago.  He noticed gait/balance change about 1 year ago.  Pt was dx with PD and started on carbidopa/levodopa 25/100 and worked to 1 po tid. He is not sure if the medication helps but just started 1 full pill 2 days ago   07/01/18 update: Patient is seen today in follow-up for parkinsonism.  I asked the patient to hold his levodopa before today's visit.  He last took levodopa about 2 days ago.  Patient reports he feels a "little icky" off medication. "it reduced agitation."  Generally, he takes the last pill at night because of RLS.    He has had no falls but "lots of freezes at night."  Sleeps in a recliner.  No lightheadedness or near syncope.  Some coughing with eating but not bad.    PREVIOUS MEDICATIONS: Sinemet  ALLERGIES:   Allergies  Allergen Reactions  . Tylenol [Acetaminophen] Other (See Comments)    Pt states he had a DNA test completed that stated he should never take tylenol.    CURRENT MEDICATIONS:  Outpatient Encounter Medications as of 07/01/2018  Medication Sig  . carbidopa-levodopa (SINEMET IR) 25-100 MG tablet Take 1 tablet by mouth 3 (three) times daily.  Marland Kitchen diltiazem (CARDIZEM CD) 300 MG 24 hr capsule Take 1 capsule (300 mg total) by mouth daily.  Marland Kitchen warfarin (COUMADIN) 5 MG tablet TAKE 1 TABLET BY MOUTH ONCE DAILY OR  AS  DIRECTED  BY  COUMADIN  CLINIC  . [DISCONTINUED] enoxaparin (LOVENOX) 120 MG/0.8ML injection Inject 0.8 mLs (120 mg total) into the skin every 12 (twelve) hours.   No  facility-administered encounter medications on file as of 07/01/2018.     PAST MEDICAL HISTORY:   Past Medical History:  Diagnosis Date  . Arthritis   . BENIGN PROSTATIC HYPERTROPHY, WITH OBSTRUCTION   . Cancer (HCC)    skin, basal, squamous  . COLONIC POLYPS   . Diverticulitis   . DVT (deep venous thrombosis) ([place]) 2000  . Dysrhythmia    Hx Afib- 2017  . Factor 5 Leiden mutation, heterozygous ([place])   . GERD (gastroesophageal reflux disease)    not on medication  . HIATAL HERNIA   . ITP (idiopathic thrombocytopenic purpura) 2003  . Long term current use of anticoagulant   . Parkinson's disease ([place]) 02/03/2018  . PSORIASIS   . Pulmonary embolus ([place]) 2000  . Shortness of breath dyspnea    at times - Talking alot  . Sleep apnea     PAST SURGICAL HISTORY:   Past Surgical History:  Procedure Laterality Date  . CARDIOVERSION N/A 11/22/2015   Procedure: CARDIOVERSION;  Surgeon: Sanda Klein, MD;  [facility];  Service: Cardiovascular;  Laterality: N/A;  . COLONOSCOPY    . HERNIA REPAIR Left    Inguinal- x2 . mesh x1  . INSERTION OF MESH N/A 02/25/2016   Procedure: INSERTION OF MESH;  Surgeon: Rolm Bookbinder, MD;  Location: [facility];  Service: General;  Laterality: N/A;  . LUNG REMOVAL, PARTIAL  Right 2004   was fungal and not cancerous  . PROSTATE SURGERY    . UMBILICAL HERNIA REPAIR N/A 02/25/2016   Procedure: LAPAROSCOPIC UMBILICAL HERNIA REPAIR;  Surgeon: Rolm Bookbinder, MD;  Location: [facility];  Service: General;  Laterality: N/A;    SOCIAL HISTORY:   Social History   Socioeconomic History  . Marital status: Divorced    Spouse name: Not on file  . Number of children: 2  . Years of education: Masters  . Highest education level: Not on file  Occupational History    Employer: RETIRED    Comment: police officer  Social Needs  . Financial resource strain: Not on file  . Food insecurity:    Worry: Not on file    Inability: Not on file  . Transportation  needs:    Medical: Not on file    Non-medical: Not on file  Tobacco Use  . Smoking status: Former Smoker    Packs/day: 1.00    Last attempt to quit: 06/16/1985    Years since quitting: 33.0  . Smokeless tobacco: Never Used  . Tobacco comment: quit approx age 89-   Substance and Sexual Activity  . Alcohol use: No  . Drug use: No  . Sexual activity: Yes  Lifestyle  . Physical activity:    Days per week: Not on file    Minutes per session: Not on file  . Stress: Not on file  Relationships  . Social connections:    Talks on phone: Not on file    Gets together: Not on file    Attends religious service: Not on file    Active member of club or organization: Not on file    Attends meetings of clubs or organizations: Not on file    Relationship status: Not on file  . Intimate partner violence:    Fear of current or ex partner: Not on file    Emotionally abused: Not on file    Physically abused: Not on file    Forced sexual activity: Not on file  Other Topics Concern  . Not on file  Social History Narrative   Lives alone   Caffeine use: Coffee daily   Right handed     FAMILY HISTORY:   Family Status  Relation Name Status  . Mother  Deceased  . Father  Deceased  . Sister  Alive  . Brother  Deceased  . Daughter  Alive  . Son  Alive  . MGM  Deceased  . MGF  Deceased  . PGM  Deceased  . PGF  Deceased    ROS:  Review of Systems  Constitutional: Negative.   HENT: Negative.   Eyes: Negative.   Respiratory: Positive for cough.   Cardiovascular: Negative.   Gastrointestinal: Negative.   Genitourinary: Positive for frequency.  Skin: Negative.     PHYSICAL EXAMINATION:    VITALS:   Vitals:   07/01/18 0929  BP: 124/68  Pulse: 64  SpO2: 93%  Weight: 272 lb (123.4 kg)  Height: 6' (1.829 m)    GEN:  The patient appears stated age and is in NAD. HEENT:  Normocephalic.  Has healing wounds on the forehead from recent surgery.    The mucous membranes are moist. The  superficial temporal arteries are without ropiness or tenderness. CV:  rrr Lungs:  ctab Neck/HEME: no bruits.   Neck is turned to the left and flexed with chin toward the chest   Orientation: The patient is alert and oriented  x3. Cranial nerves: There is good facial symmetry. The speech is fluent and clear. Soft palate rises symmetrically and there is no tongue deviation. Hearing is intact to conversational tone. Sensation: Sensation is intact to light touch throughout Motor: Strength is 5/5 in the bilateral upper and lower extremities.   Shoulder shrug is equal and symmetric.  There is no pronator drift.  Movement examination: Tone: There is normal tone in the upper and lower extremities. Abnormal movements: None can be seen.  A slight amount of tremor can be felt. Coordination:  There is mild decremation with RAM's, seen mostly with heel and toe taps bilaterally Gait and Station: The patient has mild difficulty arising out of a deep-seated chair without the use of the hands. The patient's stride length is good but there is a leg length discrepancy when he walks (right leg shorter).  He is slightly wide-based.    Chemistry      Component Value Date/Time   NA 141 12/26/2017 0347   K 3.9 12/26/2017 0347   CL 107 12/26/2017 0347   CO2 26 12/26/2017 0347   BUN 11 12/26/2017 0347   CREATININE 0.95 12/26/2017 0347   CREATININE 0.88 06/12/2015 1505      Component Value Date/Time   CALCIUM 8.5 (L) 12/26/2017 0347   ALKPHOS 89 12/26/2017 0347   AST 37 12/26/2017 0347   ALT 53 (H) 12/26/2017 0347   BILITOT 0.9 12/26/2017 0347     Lab Results  Component Value Date   WBC 4.7 12/26/2017   HGB 13.6 12/26/2017   HCT 40.8 12/26/2017   MCV 96.7 12/26/2017   PLT 118 (L) 12/26/2017   Lab Results  Component Value Date   TSH 2.88 10/08/2015   No results found for: IRON, TIBC, FERRITIN   ASSESSMENT/PLAN:  1.  Possible Parkinson's  -The patient was off of medication for 36 hours  today, and he did not look particularly parkinsonian.  He does describe some parkinsonian features at night, particularly restless leg syndrome and some freezing, but he really did not meet the criteria.  I am going to go ahead and do a DaTscan.  -For now, he will continue carbidopa/levodopa 25/100, 1 tablet 3 times per day.  -Add carbidopa/levodopa 50/200 at night for restless leg.  -Check ferritin/iron stores for restless leg.   2.  Cervical dystonia  -Patient does continue to have cervical dystonia.  He and I discussed the value of Botox.  Wants to hold on that.  3.   LBP and R hip pain  -saw Dr. Ellene Route but awaiting MRI results  4.  dysphagia  -mild.  Doesn't want to do MBE at this time  5.  Follow-up after the above testing has been completed.   Cc:  Stanley Wright, Dorothy, [employer]

## 2018-07-01 ENCOUNTER — Encounter: Payer: Self-pay | Admitting: Neurology

## 2018-07-01 ENCOUNTER — Ambulatory Visit: Payer: PPO | Admitting: Neurology

## 2018-07-01 ENCOUNTER — Other Ambulatory Visit (INDEPENDENT_AMBULATORY_CARE_PROVIDER_SITE_OTHER): Payer: PPO

## 2018-07-01 VITALS — BP 124/68 | HR 64 | Ht 72.0 in | Wt 272.0 lb

## 2018-07-01 DIAGNOSIS — R1319 Other dysphagia: Secondary | ICD-10-CM | POA: Diagnosis not present

## 2018-07-01 DIAGNOSIS — G243 Spasmodic torticollis: Secondary | ICD-10-CM | POA: Diagnosis not present

## 2018-07-01 DIAGNOSIS — G20A1 Parkinson's disease without dyskinesia, without mention of fluctuations: Secondary | ICD-10-CM

## 2018-07-01 DIAGNOSIS — R251 Tremor, unspecified: Secondary | ICD-10-CM

## 2018-07-01 DIAGNOSIS — G2581 Restless legs syndrome: Secondary | ICD-10-CM

## 2018-07-01 DIAGNOSIS — G20 Parkinson's disease: Secondary | ICD-10-CM

## 2018-07-01 MED ORDER — CARBIDOPA-LEVODOPA ER 50-200 MG PO TBCR: 1.0000 | EXTENDED_RELEASE_TABLET | Freq: Every day | ORAL | 1 refills | Status: DC

## 2018-07-01 NOTE — Patient Instructions (Signed)
1. Your provider has requested that you have labwork completed today. Please go to [facility] ([address]) on the second floor of this building before leaving the office today. You do not need to check in. If you are not called within 15 minutes please check with the front desk.   2. Start Carbidopa Levodopa 50/200 - one tablet at bedtime.   3. We have sent a referral to [facility] for your DAT scan and they will call you directly to schedule your appt.. They are located at [address] If you need to contact them directly please call [phone].

## 2018-07-02 ENCOUNTER — Ambulatory Visit: Payer: PPO | Admitting: Neurology

## 2018-07-02 ENCOUNTER — Telehealth: Payer: Self-pay | Admitting: Neurology

## 2018-07-02 LAB — IRON,TIBC AND FERRITIN PANEL
%SAT: 35 % (calc) (ref 20–48)
Ferritin: 133 ng/mL (ref 24–380)
Iron: 113 ug/dL (ref 50–180)
TIBC: 320 mcg/dL (calc) (ref 250–425)

## 2018-07-02 NOTE — Telephone Encounter (Signed)
Mychart message sent to patient.

## 2018-07-02 NOTE — Telephone Encounter (Signed)
-----   Message from [place], DO sent at 07/02/2018  8:28 AM EST ----- I have reviewed all lab results which are normal or stable. Please inform the patient.

## 2018-07-06 ENCOUNTER — Ambulatory Visit: Payer: PPO | Admitting: Neurology

## 2018-07-07 DIAGNOSIS — M4712 Other spondylosis with myelopathy, cervical region: Secondary | ICD-10-CM | POA: Diagnosis not present

## 2018-07-07 DIAGNOSIS — M4716 Other spondylosis with myelopathy, lumbar region: Secondary | ICD-10-CM | POA: Diagnosis not present

## 2018-07-15 ENCOUNTER — Telehealth: Payer: Self-pay | Admitting: Neurology

## 2018-07-15 ENCOUNTER — Ambulatory Visit: Payer: PPO | Attending: Physician Assistant | Admitting: Physical Therapy

## 2018-07-15 ENCOUNTER — Telehealth: Payer: Self-pay | Admitting: Physical Therapy

## 2018-07-15 DIAGNOSIS — R2689 Other abnormalities of gait and mobility: Secondary | ICD-10-CM | POA: Insufficient documentation

## 2018-07-15 DIAGNOSIS — G20 Parkinson's disease: Secondary | ICD-10-CM

## 2018-07-15 NOTE — Telephone Encounter (Signed)
-----   Message from Frazier Butt, PT sent at 07/15/2018  8:20 AM EST ----- Please see request for PT eval referral.  Thank you.  Mady Haagensen, PT

## 2018-07-15 NOTE — Telephone Encounter (Signed)
Good morning, I have Silvano Bilis scheduled later this morning for a return PT eval from his discharge 6 months ago.  His referring doctor originally was Maude Leriche; I have reached out to her for referral for PT and have not yet heard back.  Since you have seen Mr. Winsor, if you agree about this return eval, could you send an order for PT via Epic?  Thank you, Mady Haagensen, PT 07/15/18 8:19 AM Phone: [phone] Fax: [phone]

## 2018-07-15 NOTE — Telephone Encounter (Signed)
Order entered

## 2018-07-16 ENCOUNTER — Encounter: Payer: Self-pay | Admitting: Physical Therapy

## 2018-07-16 ENCOUNTER — Ambulatory Visit: Payer: PPO

## 2018-07-16 DIAGNOSIS — I4819 Other persistent atrial fibrillation: Secondary | ICD-10-CM

## 2018-07-16 DIAGNOSIS — Z5181 Encounter for therapeutic drug level monitoring: Secondary | ICD-10-CM | POA: Diagnosis not present

## 2018-07-16 LAB — POCT INR: INR: 2.1 (ref 2.0–3.0)

## 2018-07-16 NOTE — Patient Instructions (Signed)
Description   Continue same dosage of 1 tablet every day. Recheck INR in 5 weeks. Call with any questions if gets any new medications or scheduled for any procedures  [phone]

## 2018-07-16 NOTE — Therapy (Signed)
[facility] [address] [place], [place], [address] Phone: [phone]   Fax:  [phone]  Physical Therapy Evaluation  Patient Details  Name: Stanley Wright MRN: [id number] Date of Birth: [birth year] Referring Provider (PT): Tat, Wells Guiles   Encounter Date: 07/15/2018  PT End of Session - 07/16/18 1037    Visit Number  1    Number of Visits  1    Authorization Type  [employer]    PT Start Time  0932    PT Stop Time  1010    PT Time Calculation (min)  38 min    Activity Tolerance  Patient tolerated treatment well    Behavior During Therapy  [facility] for tasks assessed/performed       Past Medical History:  Diagnosis Date  . Arthritis   . BENIGN PROSTATIC HYPERTROPHY, WITH OBSTRUCTION   . Cancer (HCC)    skin, basal, squamous  . COLONIC POLYPS   . Diverticulitis   . DVT (deep venous thrombosis) ([place]) 2000  . Dysrhythmia    Hx Afib- 2017  . Factor 5 Leiden mutation, heterozygous ([place])   . GERD (gastroesophageal reflux disease)    not on medication  . HIATAL HERNIA   . ITP (idiopathic thrombocytopenic purpura) 2003  . Long term current use of anticoagulant   . Parkinson's disease ([place]) 02/03/2018  . PSORIASIS   . Pulmonary embolus ([place]) 2000  . Shortness of breath dyspnea    at times - Talking alot  . Sleep apnea     Past Surgical History:  Procedure Laterality Date  . CARDIOVERSION N/A 11/22/2015   Procedure: CARDIOVERSION;  Surgeon: Sanda Klein, MD;  [facility];  Service: Cardiovascular;  Laterality: N/A;  . COLONOSCOPY    . HERNIA REPAIR Left    Inguinal- x2 . mesh x1  . INSERTION OF MESH N/A 02/25/2016   Procedure: INSERTION OF MESH;  Surgeon: Rolm Bookbinder, MD;  Location: [facility];  Service: General;  Laterality: N/A;  . LUNG REMOVAL, PARTIAL Right 2004   was fungal and not cancerous  . PROSTATE SURGERY    . UMBILICAL HERNIA REPAIR N/A 02/25/2016   Procedure: LAPAROSCOPIC  UMBILICAL HERNIA REPAIR;  Surgeon: Rolm Bookbinder, MD;  Location: [facility];  Service: General;  Laterality: N/A;    There were no vitals filed for this visit.   Subjective Assessment - 07/15/18 0937    Subjective  Have about 10 episodes of PDF (Parkinson's disease freezes) a day, but no falls.  I do what I learned here.    Pertinent History  sleep apnea, a-fib, factor V deficiency    Patient Stated Goals  Pt's goal would be how to walk with carrying something heavy.    Currently in Pain?  No/denies         [facility] PT Assessment - 07/15/18 4270      Assessment   Medical Diagnosis  Parkinson's disease    Referring Provider (PT)  Tat, Wells Guiles    Onset Date/Surgical Date  03/15/18   d/c from PT     Precautions   Precautions  [place]   Has the patient fallen in the past 6 months  No    Has the patient had a decrease in activity level because of a fear of falling?   No    Is the patient reluctant to leave their home because of a fear of falling?   No  Home Environment   Living Environment  Private residence    Living Arrangements  Alone    Type of [place] to enter    Entrance Stairs-Number of Steps  2    Entrance Stairs-Rails  None    Home Layout  One level      Prior Function   Level of Independence  Independent    Leisure  Does PWR! Moves exercises from HEP 2-3 times per week, recumbent bike daily      Transfers   Transfers  Sit to Stand;Stand to Sit    Sit to Stand  6: Modified independent (Device/Increase time);From chair/3-in-1;Without upper extremity assist    Five time sit to stand comments   13.28   posterior lean with legs at chair   Stand to Sit  6: Modified independent (Device/Increase time);To chair/3-in-1;Without upper extremity assist;Uncontrolled descent      Ambulation/Gait   Ambulation/Gait  Yes    Ambulation/Gait Assistance  6: Modified independent (Device/Increase time);5: Supervision    Ambulation  Distance (Feet)  200 Feet    Assistive device  None    Gait Pattern  Step-through pattern;Decreased arm swing - left;Decreased trunk rotation;Festinating   Festinating with turns at times   Ambulation Surface  Level;Indoor    Gait velocity   3.47 ft/sec      Timed Up and Go Test   TUG  Normal TUG    Normal TUG (seconds)  10.87    Manual TUG (seconds)  12.09    Cognitive TUG (seconds)  10.69    TUG Comments  Scores >13.5-15 seconds indicate increased fall risk      Functional Gait  Assessment   Gait assessed   Yes    Gait Level Surface  Walks 20 ft in less than 7 sec but greater than 5.5 sec, uses assistive device, slower speed, mild gait deviations, or deviates 6-10 in outside of the 12 in walkway width.    Change in Gait Speed  Able to smoothly change walking speed without loss of balance or gait deviation. Deviate no more than 6 in outside of the 12 in walkway width.    Gait with Horizontal Head Turns  Performs head turns smoothly with no change in gait. Deviates no more than 6 in outside 12 in walkway width    Gait with Vertical Head Turns  Performs head turns with no change in gait. Deviates no more than 6 in outside 12 in walkway width.    Gait and Pivot Turn  Pivot turns safely within 3 sec and stops quickly with no loss of balance.    Step Over Obstacle  Is able to step over one shoe box (4.5 in total height) without changing gait speed. No evidence of imbalance.    Gait with Narrow Base of Support  Is able to ambulate for 10 steps heel to toe with no staggering.    Gait with Eyes Closed  Walks 20 ft, uses assistive device, slower speed, mild gait deviations, deviates 6-10 in outside 12 in walkway width. Ambulates 20 ft in less than 9 sec but greater than 7 sec.   6.31   Ambulating Backwards  Walks 20 ft, slow speed, abnormal gait pattern, evidence for imbalance, deviates 10-15 in outside 12 in walkway width.   17.35   Steps  Alternating feet, must use rail.    Total Score  24     FGA comment:  Score at previous d/c  is 22/30                Objective measurements completed on examination: See above findings.          Self Care:  Verbal cues for placement of larger objects slightly off-center, in order for pt to visualize feet if needed, for improved stability and decreased festination with gait/turns.  Educated pt to take multiple trips (for carrying groceries, etc), to avoid carrying too much at one time.    PT Education - 07/16/18 1027    Education Details  Comparison of objective measures from PT d/c 03/15/18; plans for return PT eval in 6 months; discussed/practiced carrying heavier objects/objects with both hands    Person(s) Educated  Patient    Methods  Explanation;Demonstration    Comprehension  Verbalized understanding;Returned demonstration       PT Short Term Goals - 02/04/18 1851      PT SHORT TERM GOAL #1   Title  The patient will return demo HEP for large amplitude movements, transitional movements for HEP.  (STG target date 02/07/2018)    Time  4    Period  Weeks    Status  Achieved      PT SHORT TERM GOAL #2   Title  The patient will improve Berg balance score from 44/56 to > or equal to 48/56 to demo dec'ing risk for falls.    Baseline  02/04/18 52/56    Time  4    Period  Weeks    Status  Achieved      PT SHORT TERM GOAL #3   Title  The patient will perform 360 degree turn with < or equal to 12 steps (baseline is 18 steps).    Baseline  02/04/18 8 steps    Time  4    Period  Weeks    Status  Achieved      PT SHORT TERM GOAL #4   Title  The patient will be further assessed on FGA and goal to follow.    Baseline  02/01/18 13/30    Time  4    Period  Weeks    Status  Achieved        PT Long Term Goals - 07/16/18 1041      PT LONG TERM GOAL #1   Title  Pt will verbalize/demonstrate understanding of lifting/carrying objects with bilateral UEs, for improved safety with gait.    Time  1    Period  Days    Status   Achieved             Plan - 07/16/18 1038    Clinical Impression Statement  Pt is a 73 year old male who presents to OPPT for follow-up evaluation following his previous PT d/c on 03/15/18.  He has diagnosis of Parkinson's disease, and will have continued work-up/DaT scan per Dr. Doristine Devoid notes.  He experiences festination of giat with turns and with carrying objects, but he feels that he is doing well and does not have PT needs at this time.  His 5x sit<>stand, FGA and TUG measures are improved from his discharge,a nd his gait velocity is only slightly slowed.  Educated pt on safe way to carry objects in bilateral hands to avoid loss of balance.  Pt does not appear to have skilled PT needs at this time and is appropriate for return eval in 6-9 months.    History and Personal Factors relevant to plan of care:  No falls  since PT d/c    Clinical Presentation  Stable    Clinical Presentation due to:  festination of gait and turns/PD diagnosis    Clinical Decision Making  Low    PT Frequency  One time visit    PT Treatment/Interventions  ADLs/Self Care Home Management    PT Next Visit Plan  Eval only; recommend return PT eval in 6-9 months.       Patient will benefit from skilled therapeutic intervention in order to improve the following deficits and impairments:     Visit Diagnosis: Other abnormalities of gait and mobility     Problem List Patient Active Problem List   Diagnosis Date Noted  . Parkinson's disease ([place]) 02/03/2018  . Community acquired pneumonia of left lower lobe of lung ([place]) 12/23/2017  . Sepsis ([place]) 12/23/2017  . Morbid obesity due to excess calories ([place]) 08/23/2016  . OSA on CPAP 08/23/2016  . Dyspnea on exertion 08/22/2016  . Umbilical hernia 50/08/7046  . Persistent atrial fibrillation   . Encounter for therapeutic drug monitoring 08/12/2013  . Long term current use of anticoagulant 07/30/2010  . COLONIC POLYPS 06/03/2010  . 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 GERD 06/03/2010  . HIATAL HERNIA 06/03/2010  . BENIGN PROSTATIC HYPERTROPHY, WITH OBSTRUCTION 06/03/2010  . PSORIASIS 06/03/2010    Edwing Figley W. 07/16/2018, 10:43 AM  Frazier Butt., PT   [facility] [address] [place], [place], [address] Phone: [phone]   Fax:  [phone]  Name: PEARSE SHIFFLER MRN: [id number] Date of Birth: Dec 17, 1945

## 2018-08-05 ENCOUNTER — Encounter (HOSPITAL_COMMUNITY)
Admission: RE | Admit: 2018-08-05 | Discharge: 2018-08-05 | Disposition: A | Discharge status: Home / Self Care | Payer: PPO | Source: Ambulatory Visit | Attending: Neurology | Admitting: Neurology

## 2018-08-05 ENCOUNTER — Ambulatory Visit (HOSPITAL_COMMUNITY)
Admission: RE | Admit: 2018-08-05 | Discharge: 2018-08-05 | Disposition: A | Discharge status: Home / Self Care | Payer: PPO | Source: Ambulatory Visit | Attending: Neurology | Admitting: Neurology

## 2018-08-05 DIAGNOSIS — I639 Cerebral infarction, unspecified: Secondary | ICD-10-CM | POA: Diagnosis not present

## 2018-08-05 DIAGNOSIS — R251 Tremor, unspecified: Secondary | ICD-10-CM | POA: Diagnosis not present

## 2018-08-05 MED ORDER — IODINE STRONG (LUGOLS) 5 % PO SOLN
0.8000 mL | Freq: Once | ORAL | Status: AC
  Administered 2018-08-05: 0.8 mL via ORAL

## 2018-08-05 MED ORDER — IOFLUPANE I 123 185 MBQ/2.5ML IV SOLN
4.9000 | Freq: Once | INTRAVENOUS | Status: AC
  Administered 2018-08-05: 4.9 via INTRAVENOUS
  Filled 2018-08-05: qty 5

## 2018-08-06 ENCOUNTER — Telehealth: Payer: Self-pay | Admitting: Neurology

## 2018-08-06 NOTE — Telephone Encounter (Signed)
Patient made aware of results and instructions.

## 2018-08-06 NOTE — Telephone Encounter (Signed)
-----   Message from [place], DO sent at 08/06/2018 10:21 AM EST ----- Let pt know that DaT showed loss of DA.  Remind him that this doesn't diagnose any disease, just gives [place] information.  No change to his current med.  Will talk more in detail at his June appt

## 2018-08-20 ENCOUNTER — Ambulatory Visit: Payer: PPO

## 2018-08-20 DIAGNOSIS — I4819 Other persistent atrial fibrillation: Secondary | ICD-10-CM | POA: Diagnosis not present

## 2018-08-20 DIAGNOSIS — Z5181 Encounter for therapeutic drug level monitoring: Secondary | ICD-10-CM

## 2018-08-20 LAB — POCT INR: INR: 2 (ref 2.0–3.0)

## 2018-08-20 NOTE — Patient Instructions (Signed)
Description   Continue same dosage of 1 tablet every day. Recheck INR in 6 weeks. Call with any questions if gets any new medications or scheduled for any procedures  [phone]    

## 2018-08-30 ENCOUNTER — Telehealth: Payer: Self-pay | Admitting: Neurology

## 2018-08-30 NOTE — Telephone Encounter (Signed)
Patient has had two surgeries on his eyelids. 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 I advised that Dr. Carles Collet would need to examine patient and decide if she agrees with that diagnosis, then they can discuss treatment. He has appt in June. He was put on wait list. He agrees with this plan.

## 2018-08-30 NOTE — Telephone Encounter (Signed)
Patient called needing to speak with you regarding a question he has. Please Call. Thanks

## 2018-09-09 ENCOUNTER — Telehealth: Payer: Self-pay | Admitting: Neurology

## 2018-09-09 NOTE — Progress Notes (Signed)
Virtual Visit via Video Note The purpose of this virtual visit is to provide medical care while limiting exposure to the novel coronavirus.    Consent was obtained for video visit:  Yes.   Answered questions that patient had about telehealth interaction:  Yes.   I discussed the limitations, risks, security and privacy concerns of performing an evaluation and management service by telemedicine. I also discussed with the patient that there may be a patient responsible charge related to this service. The patient expressed understanding and agreed to proceed.  Pt location: Home Physician Location: office Name of referring provider:  Scifres, Dorothy, PA-C I connected with Revonda Standard at patients initiation/request on 09/10/2018 at 10:00 AM EDT by video enabled telemedicine application and verified that I am speaking with the correct person using two identifiers. Pt MRN:  [id number] Pt DOB:  01/06/1946   History of Present Illness:  Patient is a 73 year old male following up today regarding restless leg and possible Parkinson's.  He is on carbidopa/levodopa 25/100, 1 tablet 3 times per day and carbidopa/levodopa 50/200 at bedtime, which is for restless leg.  "It helps for 3-4 hours but not all night."  He then wakes up to use the RR and will have trouble getting back to bed because of RLS.  He had lab work done last visit and ferritin/iron stores were normal.  DaTscan was completed on August 05, 2018.  I personally reviewed this.  This demonstrated symmetric loss of dopamine in the left head of the caudate compared to that of the right and within the left and right putamen.  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  He wanted to know what to do.  We told him that he really would need a physical exam and was placed on the wait list, but then the office was no longer anything but emergency seeing patients  because of the pandemic.  Pt stated that the symptom started 2 years ago.  They were "slamming shut."  States that this is why he had the ptosis surgery and it helped the "slamming shut some."  When asked to describe this, he states that he has to pull the eyelids open after reading about 2 pages of a book.  they don't spasm.  There is not diplopia.  There is no SOB.  No trouble with m fatigue when climbing stairs but some trouble with balance.  Has seen [facility] ophthalmology.  States that he has been reading books from [employer] and asks about why he is getting some symptoms from the later stages when we have told him he is not in the later stages.  Since our last visit, the patient denies any falls.  He denies lightheadedness or near syncope.  He denies hallucinations.    Observations/Objective:   Vitals:   09/10/18 1042  Temp: 98.4 F (36.9 C)  Weight: 263 lb (119.3 kg)     GEN:  The patient appears stated age and is in NAD. MS: Neck is turned to the left and flexed with chin toward the chest.  Neurological examination:  Orientation: The patient is alert and oriented x3. Cranial nerves: There is good facial symmetry. There is no facial hypomimia.  The speech is fluent and clear. Soft palate rises symmetrically and there is no tongue deviation. Hearing is intact to conversational tone. Motor: Strength is at least antigravity x  4.   Shoulder shrug is equal and symmetric.  There is no pronator drift.  Movement examination: Tone: unable Abnormal movements: No tremor is noted, but arms were difficult to see in the relaxed state because the patient was holding his smart phone and did not have a place to set it down for me to see him Coordination:  There is no significant decremation with RAM's, with hand opening and closing her finger taps.  The video feed kept freezing during this portion. Gait and Station: The patient pushes off of his rocking chair and arises easily.  He walks  very quickly down the hall (he was getting something out of his car for me).     Assessment and Plan:   1.    Parkinsonism             -The patient had a DaTscan in February, 2020 and there was near absent dopamine in the bilateral putamen.  Explained to patient that this is not a diagnostic or therapeutic scan.  Explained that one can lose dopamine many years prior to meeting clinical criteria for a specific diagnosis.  I had previously taken him off of his levodopa for over 36 hours and he did not look particularly parkinsonian.  However, he did not feel good off of levodopa, so we left him on it.  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  Given this in combination with restless leg, MSA is certainly in the differential.  He and I discussed this today.  We also discussed that we would need time to see if this is correct.             -For now, he will continue carbidopa/levodopa 25/100, 1 tablet 3 times per day.             -He will continue carbidopa/levodopa 50/200 at bedtime for restless leg.       2.  Cervical dystonia             -Patient does continue to have cervical dystonia.  He and I discussed the value of Botox.  Wants to hold on that.  3.     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  This can be bothersome to him, especially given that he has to drive and he has noticed it when driving and he has to physically pull the eyelids open for him to be able to continue driving.  I will go ahead and check acetylcholine receptor antibodies just to make sure we are not missing anything, but it does not sound like pure ptosis, even though he has had several surgeries now for lid lag.  -If the above is negative, then he and I did talk about the value of Botox and he is very interested in that.  We talked extensively about risk, benefits, and  side effects, especially given the fact that he is on Coumadin.  He expressed understanding.  He would like to proceed, as long as his insurance will pay for that.  I told him we would get prior authorization, and then he would be responsible for figuring out his portion of payment through his insurance.  4.  dysphagia             -mild.  Doesn't want to do MBE at this time   Follow Up Instructions:    -  I discussed the assessment and treatment plan with the patient. The patient was provided an opportunity to ask questions and all were answered. The patient agreed with the plan and demonstrated an understanding of the instructions.   The patient was advised to call back or seek an in-person evaluation if the symptoms worsen or if the condition fails to improve as anticipated.    Total Time spent in visit with the patient was:  40 min, of which more than 50% of the time was spent in counseling and/or coordinating care on diagnosis and safety given dx.   Pt understands and agrees with the plan of care outlined.     Alonza Bogus, DO

## 2018-09-09 NOTE — Telephone Encounter (Signed)
Verified medications for e-visit.  Patient does have access to scale and thermometer (no blood pressure machine). He will record these readings and give to Dr. Carles Collet at visit tomorrow.  Dr. Carles Collet Juluis Rainier.

## 2018-09-10 ENCOUNTER — Telehealth: Payer: Self-pay | Admitting: Neurology

## 2018-09-10 ENCOUNTER — Telehealth (INDEPENDENT_AMBULATORY_CARE_PROVIDER_SITE_OTHER): Payer: PPO | Admitting: Neurology

## 2018-09-10 ENCOUNTER — Other Ambulatory Visit: Payer: Self-pay

## 2018-09-10 ENCOUNTER — Encounter: Payer: Self-pay | Admitting: Neurology

## 2018-09-10 VITALS — Temp 98.4°F | Wt 263.0 lb

## 2018-09-10 DIAGNOSIS — R482 Apraxia: Secondary | ICD-10-CM | POA: Diagnosis not present

## 2018-09-10 DIAGNOSIS — G20 Parkinson's disease: Secondary | ICD-10-CM | POA: Diagnosis not present

## 2018-09-10 DIAGNOSIS — G2581 Restless legs syndrome: Secondary | ICD-10-CM | POA: Diagnosis not present

## 2018-09-10 DIAGNOSIS — H02403 Unspecified ptosis of bilateral eyelids: Secondary | ICD-10-CM

## 2018-09-10 NOTE — Telephone Encounter (Signed)
1. Message sent to Manuela Schwartz to work on [employer] authorization (copied to [place]).   2. Labs ordered. Mychart message sent to patient.   3. Follow up visit scheduled.

## 2018-09-10 NOTE — Telephone Encounter (Signed)
-----   Message from [place], DO sent at 09/10/2018 10:41 AM EDT ----- Please do the following: 1.  botox prior auth:  blepharospasm 2.  call him to set up [facility] labs 3.  give him a f/u visit in 4 months

## 2018-09-23 ENCOUNTER — Telehealth: Payer: Self-pay

## 2018-09-23 NOTE — Telephone Encounter (Signed)
1. Do you currently have a fever? no (yes = cancel and refer to pcp for e-visit) 2. Have you recently travelled on a cruise, internationally, or to [place], [place], [place], [place], [place], or [place], [place] [employer]) ? no (yes = cancel, stay home, monitor symptoms, and contact pcp or initiate e-visit if symptoms develop) 3. Have you been in contact with someone that is currently pending confirmation of Covid19 testing or has been confirmed to have the Covid19 virus?  n (yes = cancel, stay home, away from tested individual, monitor symptoms, and contact pcp or initiate e-visit if symptoms develop) 4. Are you currently experiencing fatigue or cough? no (yes = pt should be prepared to have a mask placed at the time of their visit).  Pt. Advised that we are restricting visitors at this time and anyone present in the vehicle should meet the above criteria as well. Advised that visit will be at curbside for finger stick ONLY and will receive call with instructions. Pt also advised to please bring own pen for signature of arrival document.

## 2018-09-24 ENCOUNTER — Other Ambulatory Visit: Payer: Self-pay

## 2018-09-24 ENCOUNTER — Ambulatory Visit (INDEPENDENT_AMBULATORY_CARE_PROVIDER_SITE_OTHER): Payer: PPO | Admitting: *Deleted

## 2018-09-24 DIAGNOSIS — R5383 Other fatigue: Secondary | ICD-10-CM | POA: Diagnosis not present

## 2018-09-24 DIAGNOSIS — Z79899 Other long term (current) drug therapy: Secondary | ICD-10-CM | POA: Diagnosis not present

## 2018-09-24 DIAGNOSIS — Z5181 Encounter for therapeutic drug level monitoring: Secondary | ICD-10-CM | POA: Diagnosis not present

## 2018-09-24 DIAGNOSIS — I4819 Other persistent atrial fibrillation: Secondary | ICD-10-CM | POA: Diagnosis not present

## 2018-09-24 DIAGNOSIS — Z7982 Long term (current) use of aspirin: Secondary | ICD-10-CM | POA: Diagnosis not present

## 2018-09-24 DIAGNOSIS — D473 Essential (hemorrhagic) thrombocythemia: Secondary | ICD-10-CM | POA: Diagnosis not present

## 2018-09-24 DIAGNOSIS — R918 Other nonspecific abnormal finding of lung field: Secondary | ICD-10-CM | POA: Diagnosis not present

## 2018-09-24 DIAGNOSIS — D72829 Elevated white blood cell count, unspecified: Secondary | ICD-10-CM | POA: Diagnosis not present

## 2018-09-24 DIAGNOSIS — Z122 Encounter for screening for malignant neoplasm of respiratory organs: Secondary | ICD-10-CM | POA: Diagnosis not present

## 2018-09-24 DIAGNOSIS — F17210 Nicotine dependence, cigarettes, uncomplicated: Secondary | ICD-10-CM | POA: Diagnosis not present

## 2018-09-24 DIAGNOSIS — D509 Iron deficiency anemia, unspecified: Secondary | ICD-10-CM | POA: Diagnosis not present

## 2018-09-24 LAB — POCT INR: INR: 2.1 (ref 2.0–3.0)

## 2018-09-28 ENCOUNTER — Encounter: Payer: Self-pay | Admitting: *Deleted

## 2018-09-28 NOTE — Progress Notes (Signed)
09/28/2018 The following was copied from [employer]: https://he[employer]hteam[employer].com/wp-content/uploads/2020-HTA-THN-UM-Prior-Auth-Code-List_20200401.pdf   2020 HTA Prior Authorization Code List  Key Rule Description     J-Codes  J-codes (except I4332) do not require prior authorization unless provided in a [facility] setting or as part of a SNF Drug Carve-out.   On this list [phone] was not present therefore no prior needed for this procedure either.

## 2018-09-29 ENCOUNTER — Telehealth: Payer: Self-pay | Admitting: *Deleted

## 2018-09-29 NOTE — Telephone Encounter (Signed)
Opened in error

## 2018-10-07 DIAGNOSIS — M542 Cervicalgia: Secondary | ICD-10-CM | POA: Diagnosis not present

## 2018-10-07 DIAGNOSIS — M47812 Spondylosis without myelopathy or radiculopathy, cervical region: Secondary | ICD-10-CM | POA: Diagnosis not present

## 2018-10-07 DIAGNOSIS — G44209 Tension-type headache, unspecified, not intractable: Secondary | ICD-10-CM | POA: Diagnosis not present

## 2018-10-07 DIAGNOSIS — M9901 Segmental and somatic dysfunction of cervical region: Secondary | ICD-10-CM | POA: Diagnosis not present

## 2018-10-14 DIAGNOSIS — M542 Cervicalgia: Secondary | ICD-10-CM | POA: Diagnosis not present

## 2018-10-14 DIAGNOSIS — M47812 Spondylosis without myelopathy or radiculopathy, cervical region: Secondary | ICD-10-CM | POA: Diagnosis not present

## 2018-10-14 DIAGNOSIS — G44209 Tension-type headache, unspecified, not intractable: Secondary | ICD-10-CM | POA: Diagnosis not present

## 2018-10-14 DIAGNOSIS — M9901 Segmental and somatic dysfunction of cervical region: Secondary | ICD-10-CM | POA: Diagnosis not present

## 2018-10-18 DIAGNOSIS — G44209 Tension-type headache, unspecified, not intractable: Secondary | ICD-10-CM | POA: Diagnosis not present

## 2018-10-18 DIAGNOSIS — M542 Cervicalgia: Secondary | ICD-10-CM | POA: Diagnosis not present

## 2018-10-18 DIAGNOSIS — M47812 Spondylosis without myelopathy or radiculopathy, cervical region: Secondary | ICD-10-CM | POA: Diagnosis not present

## 2018-10-18 DIAGNOSIS — M9901 Segmental and somatic dysfunction of cervical region: Secondary | ICD-10-CM | POA: Diagnosis not present

## 2018-10-21 DIAGNOSIS — G44209 Tension-type headache, unspecified, not intractable: Secondary | ICD-10-CM | POA: Diagnosis not present

## 2018-10-21 DIAGNOSIS — M47812 Spondylosis without myelopathy or radiculopathy, cervical region: Secondary | ICD-10-CM | POA: Diagnosis not present

## 2018-10-21 DIAGNOSIS — M542 Cervicalgia: Secondary | ICD-10-CM | POA: Diagnosis not present

## 2018-10-21 DIAGNOSIS — M9901 Segmental and somatic dysfunction of cervical region: Secondary | ICD-10-CM | POA: Diagnosis not present

## 2018-10-25 DIAGNOSIS — G44209 Tension-type headache, unspecified, not intractable: Secondary | ICD-10-CM | POA: Diagnosis not present

## 2018-10-25 DIAGNOSIS — M542 Cervicalgia: Secondary | ICD-10-CM | POA: Diagnosis not present

## 2018-10-25 DIAGNOSIS — M47812 Spondylosis without myelopathy or radiculopathy, cervical region: Secondary | ICD-10-CM | POA: Diagnosis not present

## 2018-10-25 DIAGNOSIS — M9901 Segmental and somatic dysfunction of cervical region: Secondary | ICD-10-CM | POA: Diagnosis not present

## 2018-10-28 DIAGNOSIS — M47812 Spondylosis without myelopathy or radiculopathy, cervical region: Secondary | ICD-10-CM | POA: Diagnosis not present

## 2018-10-28 DIAGNOSIS — M542 Cervicalgia: Secondary | ICD-10-CM | POA: Diagnosis not present

## 2018-10-28 DIAGNOSIS — G44209 Tension-type headache, unspecified, not intractable: Secondary | ICD-10-CM | POA: Diagnosis not present

## 2018-10-28 DIAGNOSIS — M9901 Segmental and somatic dysfunction of cervical region: Secondary | ICD-10-CM | POA: Diagnosis not present

## 2018-11-01 DIAGNOSIS — M47812 Spondylosis without myelopathy or radiculopathy, cervical region: Secondary | ICD-10-CM | POA: Diagnosis not present

## 2018-11-01 DIAGNOSIS — M9901 Segmental and somatic dysfunction of cervical region: Secondary | ICD-10-CM | POA: Diagnosis not present

## 2018-11-01 DIAGNOSIS — G44209 Tension-type headache, unspecified, not intractable: Secondary | ICD-10-CM | POA: Diagnosis not present

## 2018-11-01 DIAGNOSIS — M542 Cervicalgia: Secondary | ICD-10-CM | POA: Diagnosis not present

## 2018-11-04 ENCOUNTER — Telehealth: Payer: Self-pay

## 2018-11-04 DIAGNOSIS — M9901 Segmental and somatic dysfunction of cervical region: Secondary | ICD-10-CM | POA: Diagnosis not present

## 2018-11-04 DIAGNOSIS — M542 Cervicalgia: Secondary | ICD-10-CM | POA: Diagnosis not present

## 2018-11-04 DIAGNOSIS — G44209 Tension-type headache, unspecified, not intractable: Secondary | ICD-10-CM | POA: Diagnosis not present

## 2018-11-04 DIAGNOSIS — M47812 Spondylosis without myelopathy or radiculopathy, cervical region: Secondary | ICD-10-CM | POA: Diagnosis not present

## 2018-11-04 NOTE — Telephone Encounter (Signed)
lmom for prescreen  

## 2018-11-05 ENCOUNTER — Ambulatory Visit (INDEPENDENT_AMBULATORY_CARE_PROVIDER_SITE_OTHER): Payer: PPO

## 2018-11-05 ENCOUNTER — Other Ambulatory Visit: Payer: Self-pay

## 2018-11-05 DIAGNOSIS — Z5181 Encounter for therapeutic drug level monitoring: Secondary | ICD-10-CM

## 2018-11-05 DIAGNOSIS — I4819 Other persistent atrial fibrillation: Secondary | ICD-10-CM | POA: Diagnosis not present

## 2018-11-05 LAB — POCT INR: INR: 2 (ref 2.0–3.0)

## 2018-11-08 ENCOUNTER — Other Ambulatory Visit: Payer: Self-pay | Admitting: Neurology

## 2018-11-09 NOTE — Telephone Encounter (Signed)
Requested Prescriptions   Pending Prescriptions Disp Refills  . carbidopa-levodopa (SINEMET IR) 25-100 MG tablet [Pharmacy Med Name: Carbidopa-Levodopa 25-100 MG Oral Tablet] 90 tablet 0    Sig: TAKE 1 TABLET BY MOUTH THREE TIMES DAILY   Rx last filled: 06/29/18 #90 3 REFILLS  Pt last seen: 07/01/18  Follow up appt scheduled:11/23/18  RX DENIED REQUEST TO SOON

## 2018-11-11 DIAGNOSIS — G44209 Tension-type headache, unspecified, not intractable: Secondary | ICD-10-CM | POA: Diagnosis not present

## 2018-11-11 DIAGNOSIS — M542 Cervicalgia: Secondary | ICD-10-CM | POA: Diagnosis not present

## 2018-11-11 DIAGNOSIS — M9901 Segmental and somatic dysfunction of cervical region: Secondary | ICD-10-CM | POA: Diagnosis not present

## 2018-11-11 DIAGNOSIS — M47812 Spondylosis without myelopathy or radiculopathy, cervical region: Secondary | ICD-10-CM | POA: Diagnosis not present

## 2018-11-12 ENCOUNTER — Telehealth: Payer: Self-pay | Admitting: Adult Health

## 2018-11-12 NOTE — Telephone Encounter (Signed)
smartphone/ consent/ my chart/ pre reg completed °

## 2018-11-14 NOTE — Progress Notes (Addendum)
Virtual Visit via Video Note   This visit type was conducted due to national recommendations for restrictions regarding the COVID-19 Pandemic (e.g. social distancing) in an effort to limit this patient's exposure and mitigate transmission in our community.  Due to his co-morbid illnesses, this patient is at least at moderate risk for complications without adequate follow up.  This format is felt to be most appropriate for this patient at this time.  All issues noted in this document were discussed and addressed.  A limited physical exam was performed with this format.  Please refer to the patient's chart for his consent to telehealth for [facility].   Date:  11/15/2018   ID:  Revonda Standard, DOB 06-08-1946, MRN [id number]  Patient Location: Home Provider Location: Home  PCP:  Scifres, Earlie Server, PA-C  Cardiologist:  Kirk Ruths, MD  Electrophysiologist:  None   Evaluation Performed:  Follow-Up Visit  Chief Complaint:  Follow up  History of Present Illness:    Stanley Wright is a 73 y.o. male we are following for ongoing assessment and management of permanent atrial fibrillation, with a history of Factor V Leiden with 2 previous pulmonary emboli on chronic Coumadin therapy.  The patient did have a successful DCCV on 11/22/2015 however atrial fibrillation recurred.  He has had an admission in July 2019 for pneumonia and E. coli bacteremia.  He was last seen in the office by Dr. Stanford Breed on 03/10/2018, he was found to have mild pedal edema which was reported as chronic.  On last office visit on 03/10/2018, the patient was continued on diltiazem for rate control but dose was increased to 300 mg daily from 240 mg daily.  Echocardiogram was ordered for reassessment of LV function.  This was completed on 03/12/2018 and revealed normal LV systolic function with an EF of 55% to 60%.  He states he has been doing okay but is having much more trouble with LEE on the higher dose of the diltiazem. He  has also been diagnosed with Parkinson's disease. He denies DOE, or rapid HR. His coumadin is monitored in the [facility].    The patient does not have symptoms concerning for COVID-19 infection (fever, chills, cough, or new shortness of breath).    Past Medical History:  Diagnosis Date  . Arthritis   . BENIGN PROSTATIC HYPERTROPHY, WITH OBSTRUCTION   . Cancer (HCC)    skin, basal, squamous  . COLONIC POLYPS   . Diverticulitis   . DVT (deep venous thrombosis) ([place]) 2000  . Dysrhythmia    Hx Afib- 2017  . Factor 5 Leiden mutation, heterozygous ([place])   . GERD (gastroesophageal reflux disease)    not on medication  . HIATAL HERNIA   . ITP (idiopathic thrombocytopenic purpura) 2003  . Long term current use of anticoagulant   . Parkinson's disease ([place]) 02/03/2018  . PSORIASIS   . Pulmonary embolus ([place]) 2000  . Shortness of breath dyspnea    at times - Talking alot  . Sleep apnea    Past Surgical History:  Procedure Laterality Date  . CARDIOVERSION N/A 11/22/2015   Procedure: CARDIOVERSION;  Surgeon: Sanda Klein, MD;  Location: [facility];  Service: Cardiovascular;  Laterality: N/A;  . COLONOSCOPY    . HERNIA REPAIR Left    Inguinal- x2 . mesh x1  . INSERTION OF MESH N/A 02/25/2016   Procedure: INSERTION OF MESH;  Surgeon: Rolm Bookbinder, MD;  Location: [facility];  Service: General;  Laterality: N/A;  .  LUNG REMOVAL, PARTIAL Right 2004   was fungal and not cancerous  . PROSTATE SURGERY    . UMBILICAL HERNIA REPAIR N/A 02/25/2016   Procedure: LAPAROSCOPIC UMBILICAL HERNIA REPAIR;  Surgeon: Rolm Bookbinder, MD;  Location: [facility];  Service: General;  Laterality: N/A;     Current Meds  Medication Sig  . carbidopa-levodopa (SINEMET CR) 50-200 MG tablet Take 1 tablet by mouth at bedtime.  . carbidopa-levodopa (SINEMET IR) 25-100 MG tablet Take 1 tablet by mouth 3 (three) times daily.  Marland Kitchen diltiazem (CARDIZEM CD) 300 MG 24 hr capsule Take 1 capsule (300 mg total) by  mouth daily.  Marland Kitchen warfarin (COUMADIN) 5 MG tablet TAKE 1 TABLET BY MOUTH ONCE DAILY OR  AS  DIRECTED  BY  COUMADIN  CLINIC     Allergies:   Tylenol [acetaminophen]   Social History   Tobacco Use  . Smoking status: Former Smoker    Packs/day: 1.00    Last attempt to quit: 06/16/1985    Years since quitting: 33.4  . Smokeless tobacco: Never Used  . Tobacco comment: quit approx age 71-   Substance Use Topics  . Alcohol use: No  . Drug use: No     Family Hx: The patient's family history includes Breast cancer in his sister; Cancer in his father and maternal grandmother; Heart disease in his brother; Stroke in his paternal grandfather.  ROS:   Please see the history of present illness.    All other systems reviewed and are negative.   Prior CV studies:   The following studies were reviewed today: Echocardiogram 03/12/2018 Left ventricle: The cavity size was normal. Systolic function was   normal. The estimated ejection fraction was in the range of 55%   to 60%. Wall motion was normal; there were no regional wall   motion abnormalities. The study is not technically sufficient to   allow evaluation of LV diastolic function. - Left atrium: The atrium was mildly to moderately dilated. - Right ventricle: The cavity size was mildly dilated. Wall   thickness was normal. - Pulmonary arteries: PA peak pressure: 31 mm Hg (S).  Labs/Other Tests and Data Reviewed:    EKG:  No ECG reviewed.  Recent Labs: 12/25/2017: Magnesium 1.9 12/26/2017: ALT 53; BUN 11; Creatinine, Ser 0.95; Hemoglobin 13.6; Platelets 118; Potassium 3.9; Sodium 141   Recent Lipid Panel Lab Results  Component Value Date/Time   CHOL 205 (H) 06/12/2015 03:05 PM   TRIG 274 (H) 06/12/2015 03:05 PM   HDL 46 06/12/2015 03:05 PM   CHOLHDL 4.5 06/12/2015 03:05 PM   LDLCALC 104 06/12/2015 03:05 PM    Wt Readings from Last 3 Encounters:  11/15/18 263 lb (119.3 kg)  09/10/18 263 lb (119.3 kg)  07/01/18 272 lb (123.4 kg)      Objective:    Vital Signs:  Ht 6\' 1"  (1.854 m)   Wt 263 lb (119.3 kg)   BMI 34.70 kg/m    VITAL SIGNS:  reviewed GEN:  no acute distress RESPIRATORY:  normal respiratory effort, symmetric expansion NEURO:  alert and oriented x 3, no obvious focal deficit PSYCH:  normal affect  2+ edema pretibial and 3+ in his feet bilaterally.   ASSESSMENT & PLAN:    1. Permanent Atrial fib:  He denies symptoms of rapid HR or bleeding on coumadin. He is having considerable edema. I have seen this via his smart phone video. I will begin HCTZ 25 mg daily. He will keep his feet up and avoid  salt. Will have follow up BMET in 4 days to assess kidney function on the diuretic. Will have a follow up visit in 1 week to evaluate his symptoms and review labs with him.   2. Anticoagulation therapy: He continues on coumadin therapy and is followed by our Pharm D at the [facility]. He denies any bleeding. I he will continue coumadin as directed by Pharm D coumadin clinic.   3. Newly diagnosed Parkinson's Disease: Followed by neurology.   COVID-19 Education: The signs and symptoms of COVID-19 were discussed with the patient and how to seek care for testing (follow up with PCP or arrange E-visit).  The importance of social distancing was discussed today.  Time:   Today, I have spent 15 minutes with the patient with telehealth technology discussing the above problems.     Medication Adjustments/Labs and Tests Ordered: Current medicines are reviewed at length with the patient today.  Concerns regarding medicines are outlined above.   Tests Ordered: Orders Placed This Encounter  Procedures  . Basic metabolic panel    Medication Changes: Meds ordered this encounter  Medications  . hydrochlorothiazide (HYDRODIURIL) 25 MG tablet    Sig: Take 0.5 tablets (12.5 mg total) by mouth daily. May increase to whole tablet if 1/2 tab is ineffective    Dispense:  30 tablet    Refill:  3    Disposition:   Follow up one week virtual office visit.   Signed, Phill Myron. West Pugh, ANP, AACC  11/15/2018 10:58 AM    [facility]

## 2018-11-15 ENCOUNTER — Telehealth: Payer: Self-pay | Admitting: Neurology

## 2018-11-15 ENCOUNTER — Other Ambulatory Visit: Payer: Self-pay | Admitting: Neurology

## 2018-11-15 ENCOUNTER — Telehealth (INDEPENDENT_AMBULATORY_CARE_PROVIDER_SITE_OTHER): Payer: PPO | Admitting: Adult Health

## 2018-11-15 VITALS — Ht 73.0 in | Wt 263.0 lb

## 2018-11-15 DIAGNOSIS — I4821 Permanent atrial fibrillation: Secondary | ICD-10-CM

## 2018-11-15 DIAGNOSIS — G20 Parkinson's disease: Secondary | ICD-10-CM

## 2018-11-15 DIAGNOSIS — M47812 Spondylosis without myelopathy or radiculopathy, cervical region: Secondary | ICD-10-CM | POA: Diagnosis not present

## 2018-11-15 DIAGNOSIS — Z7901 Long term (current) use of anticoagulants: Secondary | ICD-10-CM

## 2018-11-15 DIAGNOSIS — M542 Cervicalgia: Secondary | ICD-10-CM | POA: Diagnosis not present

## 2018-11-15 DIAGNOSIS — Z79899 Other long term (current) drug therapy: Secondary | ICD-10-CM | POA: Diagnosis not present

## 2018-11-15 DIAGNOSIS — M9901 Segmental and somatic dysfunction of cervical region: Secondary | ICD-10-CM | POA: Diagnosis not present

## 2018-11-15 DIAGNOSIS — G44209 Tension-type headache, unspecified, not intractable: Secondary | ICD-10-CM | POA: Diagnosis not present

## 2018-11-15 MED ORDER — HYDROCHLOROTHIAZIDE 25 MG PO TABS: 12.5000 mg | ORAL_TABLET | Freq: Every day | ORAL | 3 refills | Status: DC

## 2018-11-15 NOTE — Patient Instructions (Addendum)
Medication Instructions:  START HYDROCHLOROTHIAZIDE 12.5MG -IF THIS IS NOT WORKING INCREASE TO WHOLE TABLET 25 MG If you need a refill on your cardiac medications before your next appointment, please call your pharmacy.  Labwork: BMET IN 1 WEEK 11-22-2018  HERE IN OUR OFFICE AT [facility]  You will NOT need to fast   Take the provided lab slips with you to the lab for your blood draw.   When you have your labs (blood work) drawn today and your tests are completely normal, you will receive your results only by MyChart Message (if you have MyChart) -OR-  A paper copy in the mail.  If you have any lab test that is abnormal or we need to change your treatment, we will call you to review these results.  Follow-Up: .     FOLLOW UP IN ONE WEEK WITH Jory Sims, DNP, AACC VIDEO VISIT 11-22-2018  At [facility], you and your health needs are our priority.  As part of our continuing mission to provide you with exceptional heart care, we have created designated Provider Care Teams.  These Care Teams include your primary Cardiologist (physician) and Advanced Practice Providers (APPs -  Physician Assistants and Nurse Practitioners) who all work together to provide you with the care you need, when you need it.  Thank you for choosing [facility] at [facility]!!

## 2018-11-15 NOTE — Telephone Encounter (Signed)
Does patient need lab prior to appt?

## 2018-11-15 NOTE — Telephone Encounter (Signed)
Looks like he was supposed to have a myasthenia panel back in march (see lab orders - looks like it is still pending).  If he never had that drawn, then yes.

## 2018-11-15 NOTE — Telephone Encounter (Signed)
Requested Prescriptions   Pending Prescriptions Disp Refills  . carbidopa-levodopa (SINEMET IR) 25-100 MG tablet [Pharmacy Med Name: Carbidopa-Levodopa 25-100 MG Oral Tablet] 90 tablet 0    Sig: TAKE 1 TABLET BY MOUTH THREE TIMES DAILY   Rx last filled: 06/29/18 #90 3 refills  Pt last seen: 09/10/18  Follow up appt scheduled:02/25/19

## 2018-11-15 NOTE — Telephone Encounter (Signed)
Called patient he was notified to have lab drawn

## 2018-11-15 NOTE — Telephone Encounter (Signed)
Pt wants to know if he needs to have blood work done before he see Dr Tat please call patient and let him know

## 2018-11-16 ENCOUNTER — Telehealth: Payer: Self-pay | Admitting: Adult Health

## 2018-11-16 NOTE — Telephone Encounter (Signed)
Video visit, my chart/call [phone]/NPV cert complete/consent obtained -- ttf

## 2018-11-18 ENCOUNTER — Other Ambulatory Visit: Payer: Self-pay

## 2018-11-18 ENCOUNTER — Other Ambulatory Visit (INDEPENDENT_AMBULATORY_CARE_PROVIDER_SITE_OTHER): Payer: PPO

## 2018-11-18 DIAGNOSIS — G44209 Tension-type headache, unspecified, not intractable: Secondary | ICD-10-CM | POA: Diagnosis not present

## 2018-11-18 DIAGNOSIS — Z79899 Other long term (current) drug therapy: Secondary | ICD-10-CM | POA: Diagnosis not present

## 2018-11-18 DIAGNOSIS — M542 Cervicalgia: Secondary | ICD-10-CM | POA: Diagnosis not present

## 2018-11-18 DIAGNOSIS — H02403 Unspecified ptosis of bilateral eyelids: Secondary | ICD-10-CM | POA: Diagnosis not present

## 2018-11-18 DIAGNOSIS — M9901 Segmental and somatic dysfunction of cervical region: Secondary | ICD-10-CM | POA: Diagnosis not present

## 2018-11-18 DIAGNOSIS — M47812 Spondylosis without myelopathy or radiculopathy, cervical region: Secondary | ICD-10-CM | POA: Diagnosis not present

## 2018-11-18 LAB — BASIC METABOLIC PANEL
BUN/Creatinine Ratio: 16 (ref 10–24)
BUN: 21 mg/dL (ref 8–27)
CO2: 27 mmol/L (ref 20–29)
Calcium: 9.4 mg/dL (ref 8.6–10.2)
Chloride: 99 mmol/L (ref 96–106)
Creatinine, Ser: 1.3 mg/dL — ABNORMAL HIGH (ref 0.76–1.27)
GFR calc Af Amer: 63 mL/min/{1.73_m2} (ref >59)
GFR calc non Af Amer: 54 mL/min/{1.73_m2} — ABNORMAL LOW (ref >59)
Glucose: 97 mg/dL (ref 65–99)
Potassium: 4.3 mmol/L (ref 3.5–5.2)
Sodium: 141 mmol/L (ref 134–144)

## 2018-11-21 NOTE — Progress Notes (Signed)
Virtual Visit via Video Note   This visit type was conducted due to national recommendations for restrictions regarding the COVID-19 Pandemic (e.g. social distancing) in an effort to limit this patient's exposure and mitigate transmission in our community.  Due to his co-morbid illnesses, this patient is at least at moderate risk for complications without adequate follow up.  This format is felt to be most appropriate for this patient at this time.  All issues noted in this document were discussed and addressed.  A limited physical exam was performed with this format.  Please refer to the patient's chart for his consent to telehealth for [facility].   Date:  11/21/2018   ID:  Revonda Standard, DOB 09-20-1945, MRN [id number]  Patient Location: Home Provider Location: Home  PCP:  Scifres, Earlie Server, PA-C  Cardiologist:  Kirk Ruths, MD  Electrophysiologist:  None   Evaluation Performed:  Follow-Up Visit  Chief Complaint: LEE  History of Present Illness:    Stanley Wright is a 73 y.o. male with we are following for ongoing assessment and management of permanent atrial fibrillation with a history of Factor V Leiden  with 2 previous pulmonary emboli on chronic Coumadin therapy.  The patient had a successful DCCV on 11/22/2015, unfortunately atrial fibrillation recurred.  He remains on diltiazem 300 mg for rate control.  Echocardiogram on 02/2018 revealed normal LV function with an EF of 55% to 60%.  Of note, the patient was newly diagnosed with Parkinson's disease and is followed by neurology.  Last visit,  which was completed virtually on 11/15/2018, the patient was having considerable lower extremity edema which was evaluated via the smart phone video.  He was started on HCTZ 25 mg daily, he is to keep his feet up and avoid salt with follow-up be met to assess kidney function.  He was to continue Coumadin therapy, which is followed by Pharm.D. at our [facility] office.  Review of labs  revealed elevated creatinine of 1.30 compared to 0.95 11 months ago.  All other values were within normal limits, potassium was 4.3.  He is being seen today virtually to evaluate his symptoms and adjust medications if necessary.  He is feeling better and has lost 11 lbs with use of HCTZ. The edema of his LE is much better. He has some swelling chronically in his right foot. He is avoiding salt. He is to have his BP checked today and he will call us for the report of readings.   Labs are reviewed, revealing slight elevation in the creatinine with first higher dose of HCTZ. He is now on 12.5 mg daily.    The patient does not  have symptoms concerning for COVID-19 infection (fever, chills, cough, or new shortness of breath).    Past Medical History:  Diagnosis Date  . Arthritis   . BENIGN PROSTATIC HYPERTROPHY, WITH OBSTRUCTION   . Cancer (HCC)    skin, basal, squamous  . COLONIC POLYPS   . Diverticulitis   . DVT (deep venous thrombosis) ([place]) 2000  . Dysrhythmia    Hx Afib- 2017  . Factor 5 Leiden mutation, heterozygous ([place])   . GERD (gastroesophageal reflux disease)    not on medication  . HIATAL HERNIA   . ITP (idiopathic thrombocytopenic purpura) 2003  . Long term current use of anticoagulant   . Parkinson's disease ([place]) 02/03/2018  . PSORIASIS   . Pulmonary embolus ([place]) 2000  . Shortness of breath dyspnea    at times -  Talking alot  . Sleep apnea    Past Surgical History:  Procedure Laterality Date  . CARDIOVERSION N/A 11/22/2015   Procedure: CARDIOVERSION;  Surgeon: Sanda Klein, MD;  [facility];  Service: Cardiovascular;  Laterality: N/A;  . COLONOSCOPY    . HERNIA REPAIR Left    Inguinal- x2 . mesh x1  . INSERTION OF MESH N/A 02/25/2016   Procedure: INSERTION OF MESH;  Surgeon: Rolm Bookbinder, MD;  Location: [place];  Service: General;  Laterality: N/A;  . LUNG REMOVAL, PARTIAL Right 2004   was fungal and not cancerous  . PROSTATE SURGERY    . UMBILICAL  HERNIA REPAIR N/A 02/25/2016   Procedure: LAPAROSCOPIC UMBILICAL HERNIA REPAIR;  Surgeon: Rolm Bookbinder, MD;  Location: [place];  Service: General;  Laterality: N/A;     No outpatient medications have been marked as taking for the 11/22/18 encounter (Appointment) with Lendon Colonel, NP.     Allergies:   Tylenol [acetaminophen]   Social History   Tobacco Use  . Smoking status: Former Smoker    Packs/day: 1.00    Last attempt to quit: 06/16/1985    Years since quitting: 33.4  . Smokeless tobacco: Never Used  . Tobacco comment: quit approx age 3-   Substance Use Topics  . Alcohol use: No  . Drug use: No     Family Hx: The patient's family history includes Breast cancer in his sister; Cancer in his father and maternal grandmother; Heart disease in his brother; Stroke in his paternal grandfather.  ROS:   Please see the history of present illness.    All other systems reviewed and are negative.   Prior CV studies:   The following studies were reviewed today: Echocardiogram 03/12/2018 Left ventricle: The cavity size was normal. Systolic function was   normal. The estimated ejection fraction was in the range of 55%   to 60%. Wall motion was normal; there were no regional wall   motion abnormalities. The study is not technically sufficient to   allow evaluation of LV diastolic function. - Left atrium: The atrium was mildly to moderately dilated. - Right ventricle: The cavity size was mildly dilated. Wall   thickness was normal. - Pulmonary arteries: PA peak pressure: 31 mm Hg (S).  Myoview Stress test 08/07/2016  There was no ST segment deviation noted during stress.  The study is normal.  This is a low risk study.  This study was not gated due to atrial fibrillation.  Labs/Other Tests and Data Reviewed:    EKG:  No ECG reviewed.  Recent Labs: 12/25/2017: Magnesium 1.9 12/26/2017: ALT 53; Hemoglobin 13.6; Platelets 118 11/18/2018: BUN 21; Creatinine, Ser 1.30;  Potassium 4.3; Sodium 141   Recent Lipid Panel Lab Results  Component Value Date/Time   CHOL 205 (H) 06/12/2015 03:05 PM   TRIG 274 (H) 06/12/2015 03:05 PM   HDL 46 06/12/2015 03:05 PM   CHOLHDL 4.5 06/12/2015 03:05 PM   LDLCALC 104 06/12/2015 03:05 PM    Wt Readings from Last 3 Encounters:  11/15/18 263 lb (119.3 kg)  09/10/18 263 lb (119.3 kg)  07/01/18 272 lb (123.4 kg)     Objective:    Vital Signs:  There were no vitals taken for this visit.  Awake alert oriented Eyes normal and reactive to light, wearing glasses Respirations are normal and non-labored Edema resolved pre-tibial with some swelling in the right food chronically.   ASSESSMENT & PLAN:    1. Chronic Diastolic CHF with LEE:  Improved with addition of HCTZ. He went up on the dose to 25 mg for help with reducing edema, and has lost 11 lbs, He states that the lower dose works just about as well at the higher dose. He will take the lower dose of HCTZ 12.5 mg daily. Follow up BMET  2. Hypertension; He has a BP cuff but not a stethoscope. He is going to have someone take his BP today and call us. I want to make sure that his BP is tolerating the addition of HCTZ.     3.Chronic Atrial Fib: He denies rapid HR. He is compliant with coumadin and is followed in our office on [address].   4. Parkinson's Disease: Followed by neurology   COVID-19 Education: The signs and symptoms of COVID-19 were discussed with the patient and how to seek care for testing (follow up with PCP or arrange E-visit).  The importance of social distancing was discussed today.  Time:   Today, I have spent 15 minutes with the patient with telehealth technology discussing the above problems.     Medication Adjustments/Labs and Tests Ordered: Current medicines are reviewed at length with the patient today.  Concerns regarding medicines are outlined above.   Tests Ordered: BMET in 3 months   Medication Changes: No orders of the defined  types were placed in this encounter.   Disposition:  Follow up in 3 month(s)  Signed, Phill Myron. West Pugh, ANP, AACC  11/21/2018 6:07 PM    [facility]

## 2018-11-22 ENCOUNTER — Telehealth: Payer: Self-pay | Admitting: Adult Health

## 2018-11-22 ENCOUNTER — Encounter: Payer: Self-pay | Admitting: Adult Health

## 2018-11-22 ENCOUNTER — Telehealth (INDEPENDENT_AMBULATORY_CARE_PROVIDER_SITE_OTHER): Payer: PPO | Admitting: Adult Health

## 2018-11-22 VITALS — Ht 72.0 in | Wt 264.4 lb

## 2018-11-22 DIAGNOSIS — I5032 Chronic diastolic (congestive) heart failure: Secondary | ICD-10-CM | POA: Diagnosis not present

## 2018-11-22 DIAGNOSIS — I10 Essential (primary) hypertension: Secondary | ICD-10-CM

## 2018-11-22 DIAGNOSIS — D6851 Activated protein C resistance: Secondary | ICD-10-CM

## 2018-11-22 DIAGNOSIS — Z79899 Other long term (current) drug therapy: Secondary | ICD-10-CM

## 2018-11-22 NOTE — Telephone Encounter (Signed)
New message:    patient calling to report BP 119/84 for today. Please call back if any questions.

## 2018-11-22 NOTE — Telephone Encounter (Signed)
Pt informed of providers result & recommendations. Pt verbalized understanding, he will make sure to be taking 12.5mg  HCTZ. No further questions . HE will CB if BP is running low.

## 2018-11-22 NOTE — Patient Instructions (Addendum)
CALL WITH BLOOD PRESSURE's  Follow-Up: You will need a follow up appointment in 3 months.  You may see Kirk Ruths, MD or one of the following Advanced Practice Providers on your designated Care Team:  Kerin Ransom, [place]  Roby Lofts, PA-C  Sande Rives, [place]      Medication Instructions:  NO CHANGES- Your physician recommends that you continue on your current medications as directed. Please refer to the Current Medication list given to you today. If you need a refill on your cardiac medications before your next appointment, please call your pharmacy. Labwork: BMET THE WEEK BEFORE APPT ON 01-19-2019 When you have labs (blood work) and your tests are completely normal, you will receive your results ONLY by [place] (if you have MyChart) -OR- A paper copy in the mail.  At [facility], you and your health needs are our priority.  As part of our continuing mission to provide you with exceptional heart care, we have created designated Provider Care Teams.  These Care Teams include your primary Cardiologist (physician) and Advanced Practice Providers (APPs -  Physician Assistants and Nurse Practitioners) who all work together to provide you with the care you need, when you need it.  Thank you for choosing [facility] at [facility]!!

## 2018-11-22 NOTE — Telephone Encounter (Signed)
Blood pressure is a little low. He is to take HCTZ 12.5 mg daily not the 25 mg daily.  Call us for BP < 786/75 systolic.

## 2018-11-22 NOTE — Telephone Encounter (Signed)
Here is BP

## 2018-11-22 NOTE — Telephone Encounter (Signed)
Visit with NP today.  Thanks!

## 2018-11-23 ENCOUNTER — Ambulatory Visit: Payer: PPO | Admitting: Neurology

## 2018-11-24 ENCOUNTER — Telehealth: Payer: Self-pay | Admitting: Neurology

## 2018-11-24 NOTE — Telephone Encounter (Signed)
Called patient in regards to labs order on 11/18/18 lab collected final results not back Pt aware and understands requesting a e-mail or call with results when back

## 2018-11-24 NOTE — Telephone Encounter (Signed)
Patient left VM about results of blood work. Please call him back at [phone]. Thanks!

## 2018-11-25 DIAGNOSIS — G44209 Tension-type headache, unspecified, not intractable: Secondary | ICD-10-CM | POA: Diagnosis not present

## 2018-11-25 DIAGNOSIS — M542 Cervicalgia: Secondary | ICD-10-CM | POA: Diagnosis not present

## 2018-11-25 DIAGNOSIS — M9901 Segmental and somatic dysfunction of cervical region: Secondary | ICD-10-CM | POA: Diagnosis not present

## 2018-11-25 DIAGNOSIS — M47812 Spondylosis without myelopathy or radiculopathy, cervical region: Secondary | ICD-10-CM | POA: Diagnosis not present

## 2018-11-26 ENCOUNTER — Telehealth: Payer: PPO | Admitting: Neurology

## 2018-11-30 NOTE — Progress Notes (Signed)
Stanley Wright was seen today in follow up for Parkinsons disease.  Pt is currently on carbidopa/levodopa 25/100, 1 tablet 3 times per day and carbidopa/levodopa 50/200 at bedtime. "I take them when I need them - when I am anxious."  Gets up to urinate in the middle of the night and then has trouble getting back to sleep because of RLS.  Pt denies falls but has had close calls.  Pt denies lightheadedness, near syncope.  No hallucinations.  Mood has been good.  Last time, he is describing what sounded like eyelid opening apraxia and stated that it was so bad that sometimes he had to physically hold his eyelids open to be able to continue driving.  I was not able to see that well on the video, so this was one of the primary reasons to bring him in today.  I also ordered myasthenia labs on him just to make sure that was not the reason for his closing of his eyes and apparently [facility] is having an issue with the reagant and the test is on backorder.  He states that when he reads, his eyes will close.  He has realized if he puts a cold washcloth on the eyes, it will help open the eyes.  He reports that he has had 2 different eye surgeries and it has not helped keep the eyes open.  He denies double vision.   PREVIOUS MEDICATIONS: Sinemet  ALLERGIES:   Allergies  Allergen Reactions  . Tylenol [Acetaminophen] Other (See Comments)    Pt states he had a DNA test completed that stated he should never take tylenol.    CURRENT MEDICATIONS:  Outpatient Encounter Medications as of 12/02/2018  Medication Sig  . carbidopa-levodopa (SINEMET CR) 50-200 MG tablet Take 1 tablet by mouth at bedtime.  . carbidopa-levodopa (SINEMET IR) 25-100 MG tablet TAKE 1 TABLET BY MOUTH THREE TIMES DAILY  . diltiazem (CARDIZEM CD) 300 MG 24 hr capsule Take 1 capsule (300 mg total) by mouth daily.  . hydrochlorothiazide (HYDRODIURIL) 25 MG tablet Take 0.5 tablets (12.5 mg total) by mouth daily. May increase to whole tablet if 1/2  tab is ineffective  . warfarin (COUMADIN) 5 MG tablet TAKE 1 TABLET BY MOUTH ONCE DAILY OR  AS  DIRECTED  BY  COUMADIN  CLINIC   No facility-administered encounter medications on file as of 12/02/2018.     PAST MEDICAL HISTORY:   Past Medical History:  Diagnosis Date  . Arthritis   . BENIGN PROSTATIC HYPERTROPHY, WITH OBSTRUCTION   . Cancer (HCC)    skin, basal, squamous  . COLONIC POLYPS   . Diverticulitis   . DVT (deep venous thrombosis) ([place]) 2000  . Dysrhythmia    Hx Afib- 2017  . Factor 5 Leiden mutation, heterozygous ([place])   . GERD (gastroesophageal reflux disease)    not on medication  . HIATAL HERNIA   . ITP (idiopathic thrombocytopenic purpura) 2003  . Long term current use of anticoagulant   . Parkinson's disease ([place]) 02/03/2018  . PSORIASIS   . Pulmonary embolus ([place]) 2000  . Shortness of breath dyspnea    at times - Talking alot  . Sleep apnea     PAST SURGICAL HISTORY:   Past Surgical History:  Procedure Laterality Date  . CARDIOVERSION N/A 11/22/2015   Procedure: CARDIOVERSION;  Surgeon: Sanda Klein, MD;  [facility];  Service: Cardiovascular;  Laterality: N/A;  . COLONOSCOPY    . HERNIA REPAIR Left  Inguinal- x2 . mesh x1  . INSERTION OF MESH N/A 02/25/2016   Procedure: INSERTION OF MESH;  Surgeon: Rolm Bookbinder, MD;  Location: [place];  Service: General;  Laterality: N/A;  . LUNG REMOVAL, PARTIAL Right 2004   was fungal and not cancerous  . PROSTATE SURGERY    . UMBILICAL HERNIA REPAIR N/A 02/25/2016   Procedure: LAPAROSCOPIC UMBILICAL HERNIA REPAIR;  Surgeon: Rolm Bookbinder, MD;  Location: [place];  Service: General;  Laterality: N/A;    SOCIAL HISTORY:   Social History   Socioeconomic History  . Marital status: Divorced    Spouse name: Not on file  . Number of children: 2  . Years of education: Masters  . Highest education level: Not on file  Occupational History    Employer: RETIRED    Comment: police officer  Social Needs   . Financial resource strain: Not on file  . Food insecurity    Worry: Not on file    Inability: Not on file  . Transportation needs    Medical: Not on file    Non-medical: Not on file  Tobacco Use  . Smoking status: Former Smoker    Packs/day: 1.00    Quit date: 06/16/1985    Years since quitting: 33.4  . Smokeless tobacco: Never Used  . Tobacco comment: quit approx age 43-   Substance and Sexual Activity  . Alcohol use: No  . Drug use: No  . Sexual activity: Yes  Lifestyle  . Physical activity    Days per week: Not on file    Minutes per session: Not on file  . Stress: Not on file  Relationships  . Social Herbalist on phone: Not on file    Gets together: Not on file    Attends religious service: Not on file    Active member of club or organization: Not on file    Attends meetings of clubs or organizations: Not on file    Relationship status: Not on file  . Intimate partner violence    Fear of current or ex partner: Not on file    Emotionally abused: Not on file    Physically abused: Not on file    Forced sexual activity: Not on file  Other Topics Concern  . Not on file  Social History Narrative   Lives alone   Caffeine use: Coffee daily   Right handed     FAMILY HISTORY:   Family Status  Relation Name Status  . Mother  Deceased  . Father  Deceased  . Sister  Alive  . Brother  Deceased  . Daughter  Alive  . Son  Alive  . MGM  Deceased  . MGF  Deceased  . PGM  Deceased  . PGF  Deceased    ROS:  Review of Systems  Constitutional: Negative.   HENT: Negative.   Respiratory: Negative.   Cardiovascular: Negative.   Gastrointestinal: Negative.   Genitourinary: Negative.   Skin: Negative.     PHYSICAL EXAMINATION:    VITALS:   Vitals:   12/02/18 1057  BP: 130/84  Pulse: 97  Temp: 98.6 F (37 C)  SpO2: 97%  Weight: 275 lb 12.8 oz (125.1 kg)  Height: 5\' 10"  (1.778 m)    GEN:  The patient appears stated age and is in NAD. HEENT:   Normocephalic, atraumatic.  The mucous membranes are moist. The superficial temporal arteries are without ropiness or tenderness. CV:  RRR Lungs:  CTAB  Neck/HEME:  There are no carotid bruits bilaterally.  Neurological examination:  Orientation: The patient is alert and oriented x3. Cranial nerves: There is good facial symmetry with facial hypomimia.  He was able to sustain prolonged upgaze today without eliciting diplopia.  He tried was the closure of eyes for quite some time.  He will call if he read his phone for a short period of time, it would elicit, but we waited for over 10 minutes and it did not elicit his symptoms.  The speech is fluent and dysarthric. Soft palate rises symmetrically and there is no tongue deviation. Hearing is intact to conversational tone. Sensation: Sensation is intact to light touch throughout Motor: Strength is at least antigravity x4.  Movement examination: Tone: There is normal tone in the upper and lower extremities Abnormal movements: None Coordination:  There is no decremation with RAM's Gait and Station: The patient ambulates well in the hall.  CBC    Component Value Date/Time   WBC 4.7 12/26/2017 0347   RBC 4.22 12/26/2017 0347   HGB 13.6 12/26/2017 0347   HCT 40.8 12/26/2017 0347   PLT 118 (L) 12/26/2017 0347   MCV 96.7 12/26/2017 0347   MCV 93.9 07/29/2016 0912   MCH 32.2 12/26/2017 0347   MCHC 33.3 12/26/2017 0347   RDW 14.2 12/26/2017 0347   LYMPHSABS 2.2 02/19/2016 0848   MONOABS 0.6 02/19/2016 0848   EOSABS 0.2 02/19/2016 0848   BASOSABS 0.1 02/19/2016 0848      ASSESSMENT/PLAN:  1.  Parkinsonism -The patient had a DaTscan in February, 2020 and there was near absent dopamine in the bilateral putamen.  Explained to patient that this is not a diagnostic or therapeutic scan.  Explained that one can lose dopamine many years prior to meeting clinical criteria for a specific diagnosis.  I had previously taken him off of  his levodopa for over 36 hours and he did not look particularly parkinsonian.  However, he did not feel good off of levodopa, so we left him on it.  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  Given this in combination with restless leg, MSA is certainly in the differential.  He and I discussed this today.  We also discussed that we would need time to see if this is correct. -For now, he will continue carbidopa/levodopa 25/100, 1 tablet 3 times per day.  Discussed not chewing the pills.  Discussed not taking it "as needed" but rather taking it on a regular schedule.   -He will continue carbidopa/levodopa 50/200 at bedtime for restless leg.  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 He and I discussed the value of Botox. Wants to hold on that.  3.     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  It occurs when his eyes are fatigued, especially after reading or driving.  He notices that if he puts a cold washcloth on the eyes, it gets better.  I asked him to put ice on the eyelids at home and see if it  resolves the symptoms once he has them.  He is going to try it.  -Acetylcholine receptor antibodies are pending.  -I did tell him that we would not want to do Botox for eyelid opening apraxia if the patient has myasthenia.  A course of Mestinon may be of benefit if we are still questioning the diagnosis.  4.dysphagia -mild. Doesn't want to do MBE at this time  5.  F/u 4-6 months.  Much greater than 50% of  this visit was spent in counseling and coordinating care.  Total face to face time:  25 min  Cc:  Scifres, Earlie Server, [employer]

## 2018-12-01 ENCOUNTER — Other Ambulatory Visit: Payer: Self-pay

## 2018-12-01 ENCOUNTER — Telehealth: Payer: Self-pay

## 2018-12-01 NOTE — Telephone Encounter (Signed)
That is a lot of time and unacceptable turnaround time, especially since they didn't call to let us know.

## 2018-12-01 NOTE — Telephone Encounter (Signed)
-----   Message from [place], DO sent at 11/30/2018  5:20 PM EDT ----- Can you check on his 6/4 labs I ordered (MG panel).  Those can sometimes be slow but usually not 2 weeks.

## 2018-12-01 NOTE — Telephone Encounter (Signed)
Called Stanley Wright at lab she states that she called [facility] about labs. They told her that Part 1 of result is complete but the reagent that they use with this lab is on back order. Results want be complete until 12/09/18 once they receive reagent.

## 2018-12-01 NOTE — Telephone Encounter (Signed)
agreed

## 2018-12-02 ENCOUNTER — Encounter: Payer: Self-pay | Admitting: Neurology

## 2018-12-02 ENCOUNTER — Other Ambulatory Visit: Payer: Self-pay

## 2018-12-02 ENCOUNTER — Ambulatory Visit (INDEPENDENT_AMBULATORY_CARE_PROVIDER_SITE_OTHER): Payer: PPO | Admitting: Neurology

## 2018-12-02 VITALS — BP 130/84 | HR 97 | Temp 98.6°F | Ht 70.0 in | Wt 275.8 lb

## 2018-12-02 DIAGNOSIS — G20 Parkinson's disease: Secondary | ICD-10-CM | POA: Diagnosis not present

## 2018-12-02 DIAGNOSIS — G20A1 Parkinson's disease without dyskinesia, without mention of fluctuations: Secondary | ICD-10-CM

## 2018-12-02 DIAGNOSIS — G2581 Restless legs syndrome: Secondary | ICD-10-CM | POA: Diagnosis not present

## 2018-12-02 DIAGNOSIS — G44209 Tension-type headache, unspecified, not intractable: Secondary | ICD-10-CM | POA: Diagnosis not present

## 2018-12-02 DIAGNOSIS — M542 Cervicalgia: Secondary | ICD-10-CM | POA: Diagnosis not present

## 2018-12-02 DIAGNOSIS — M47812 Spondylosis without myelopathy or radiculopathy, cervical region: Secondary | ICD-10-CM | POA: Diagnosis not present

## 2018-12-02 DIAGNOSIS — H02403 Unspecified ptosis of bilateral eyelids: Secondary | ICD-10-CM

## 2018-12-02 DIAGNOSIS — M9901 Segmental and somatic dysfunction of cervical region: Secondary | ICD-10-CM | POA: Diagnosis not present

## 2018-12-03 ENCOUNTER — Ambulatory Visit: Payer: PPO | Admitting: Neurology

## 2018-12-08 LAB — MYASTHENIA GRAVIS PANEL 2
A CHR BINDING ABS: <0.3 nmol/L
ACHR Blocking Abs: <15 % Inhibition (ref <15)
Acetylchol Modul Ab: 10 % Inhibition

## 2018-12-09 ENCOUNTER — Telehealth: Payer: Self-pay | Admitting: Neurology

## 2018-12-09 DIAGNOSIS — M47812 Spondylosis without myelopathy or radiculopathy, cervical region: Secondary | ICD-10-CM | POA: Diagnosis not present

## 2018-12-09 DIAGNOSIS — M542 Cervicalgia: Secondary | ICD-10-CM | POA: Diagnosis not present

## 2018-12-09 DIAGNOSIS — G44209 Tension-type headache, unspecified, not intractable: Secondary | ICD-10-CM | POA: Diagnosis not present

## 2018-12-09 DIAGNOSIS — M9901 Segmental and somatic dysfunction of cervical region: Secondary | ICD-10-CM | POA: Diagnosis not present

## 2018-12-09 MED ORDER — PYRIDOSTIGMINE BROMIDE 60 MG PO TABS: 60.0000 mg | ORAL_TABLET | Freq: Three times a day (TID) | ORAL | 1 refills | Status: DC

## 2018-12-09 NOTE — Telephone Encounter (Signed)
Let pt know that labs did finally come back and looked ok.  I'm still not sure if botox is the right choice or if we should still try a little medication for ocular (eye) myasthenia, even though the labs look okay.  The issue with that medication is GI upset but it may be worth trying it for a few weeks to see how his eyes do.  See if he is willing (med called mestinon)

## 2018-12-09 NOTE — Telephone Encounter (Signed)
Called spoke with patient he is aware and understands. He would like to try medication  [facility] on [address]

## 2018-12-09 NOTE — Telephone Encounter (Signed)
Order place

## 2018-12-09 NOTE — Telephone Encounter (Signed)
Send in RX for mestinon 60 mg tid.

## 2018-12-13 ENCOUNTER — Telehealth: Payer: Self-pay

## 2018-12-13 DIAGNOSIS — M47812 Spondylosis without myelopathy or radiculopathy, cervical region: Secondary | ICD-10-CM | POA: Diagnosis not present

## 2018-12-13 DIAGNOSIS — G44209 Tension-type headache, unspecified, not intractable: Secondary | ICD-10-CM | POA: Diagnosis not present

## 2018-12-13 DIAGNOSIS — M542 Cervicalgia: Secondary | ICD-10-CM | POA: Diagnosis not present

## 2018-12-13 DIAGNOSIS — M9901 Segmental and somatic dysfunction of cervical region: Secondary | ICD-10-CM | POA: Diagnosis not present

## 2018-12-13 NOTE — Telephone Encounter (Signed)

## 2018-12-15 DIAGNOSIS — M542 Cervicalgia: Secondary | ICD-10-CM | POA: Diagnosis not present

## 2018-12-15 DIAGNOSIS — G44209 Tension-type headache, unspecified, not intractable: Secondary | ICD-10-CM | POA: Diagnosis not present

## 2018-12-15 DIAGNOSIS — M9901 Segmental and somatic dysfunction of cervical region: Secondary | ICD-10-CM | POA: Diagnosis not present

## 2018-12-15 DIAGNOSIS — M47812 Spondylosis without myelopathy or radiculopathy, cervical region: Secondary | ICD-10-CM | POA: Diagnosis not present

## 2018-12-20 ENCOUNTER — Other Ambulatory Visit: Payer: Self-pay

## 2018-12-20 ENCOUNTER — Ambulatory Visit (INDEPENDENT_AMBULATORY_CARE_PROVIDER_SITE_OTHER): Payer: PPO | Admitting: *Deleted

## 2018-12-20 DIAGNOSIS — M47812 Spondylosis without myelopathy or radiculopathy, cervical region: Secondary | ICD-10-CM | POA: Diagnosis not present

## 2018-12-20 DIAGNOSIS — I4819 Other persistent atrial fibrillation: Secondary | ICD-10-CM | POA: Diagnosis not present

## 2018-12-20 DIAGNOSIS — M9901 Segmental and somatic dysfunction of cervical region: Secondary | ICD-10-CM | POA: Diagnosis not present

## 2018-12-20 DIAGNOSIS — M542 Cervicalgia: Secondary | ICD-10-CM | POA: Diagnosis not present

## 2018-12-20 DIAGNOSIS — G44209 Tension-type headache, unspecified, not intractable: Secondary | ICD-10-CM | POA: Diagnosis not present

## 2018-12-20 DIAGNOSIS — Z5181 Encounter for therapeutic drug level monitoring: Secondary | ICD-10-CM | POA: Diagnosis not present

## 2018-12-20 LAB — POCT INR: INR: 2.6 (ref 2.0–3.0)

## 2018-12-20 NOTE — Patient Instructions (Signed)
Description   Continue same dosage of 1 tablet every day. Recheck INR in 6 weeks. Call with any questions if gets any new medications or scheduled for any procedures  [phone]

## 2018-12-23 DIAGNOSIS — M9901 Segmental and somatic dysfunction of cervical region: Secondary | ICD-10-CM | POA: Diagnosis not present

## 2018-12-23 DIAGNOSIS — M47812 Spondylosis without myelopathy or radiculopathy, cervical region: Secondary | ICD-10-CM | POA: Diagnosis not present

## 2018-12-23 DIAGNOSIS — G44209 Tension-type headache, unspecified, not intractable: Secondary | ICD-10-CM | POA: Diagnosis not present

## 2018-12-23 DIAGNOSIS — M542 Cervicalgia: Secondary | ICD-10-CM | POA: Diagnosis not present

## 2018-12-24 ENCOUNTER — Other Ambulatory Visit: Payer: Self-pay | Admitting: Cardiology

## 2018-12-26 ENCOUNTER — Other Ambulatory Visit: Payer: Self-pay | Admitting: Neurology

## 2018-12-27 DIAGNOSIS — M542 Cervicalgia: Secondary | ICD-10-CM | POA: Diagnosis not present

## 2018-12-27 DIAGNOSIS — M9901 Segmental and somatic dysfunction of cervical region: Secondary | ICD-10-CM | POA: Diagnosis not present

## 2018-12-27 DIAGNOSIS — G44209 Tension-type headache, unspecified, not intractable: Secondary | ICD-10-CM | POA: Diagnosis not present

## 2018-12-27 DIAGNOSIS — M47812 Spondylosis without myelopathy or radiculopathy, cervical region: Secondary | ICD-10-CM | POA: Diagnosis not present

## 2018-12-28 NOTE — Telephone Encounter (Signed)
Requested Prescriptions   Pending Prescriptions Disp Refills  . carbidopa-levodopa (SINEMET CR) 50-200 MG tablet [Pharmacy Med Name: Carbidopa-Levodopa ER 50-200 MG Oral Tablet Extended Release] 90 tablet 0    Sig: TAKE 1 TABLET BY MOUTH AT BEDTIME   Rx last filled:07/01/18 #90 1 refillls  Pt last seen: 12/02/18   Follow up appt scheduled:02/25/19

## 2019-01-03 DIAGNOSIS — M542 Cervicalgia: Secondary | ICD-10-CM | POA: Diagnosis not present

## 2019-01-03 DIAGNOSIS — M9901 Segmental and somatic dysfunction of cervical region: Secondary | ICD-10-CM | POA: Diagnosis not present

## 2019-01-03 DIAGNOSIS — M47812 Spondylosis without myelopathy or radiculopathy, cervical region: Secondary | ICD-10-CM | POA: Diagnosis not present

## 2019-01-03 DIAGNOSIS — G44209 Tension-type headache, unspecified, not intractable: Secondary | ICD-10-CM | POA: Diagnosis not present

## 2019-01-06 ENCOUNTER — Telehealth: Payer: Self-pay | Admitting: *Deleted

## 2019-01-06 DIAGNOSIS — M542 Cervicalgia: Secondary | ICD-10-CM | POA: Diagnosis not present

## 2019-01-06 DIAGNOSIS — Z79899 Other long term (current) drug therapy: Secondary | ICD-10-CM

## 2019-01-06 DIAGNOSIS — M9901 Segmental and somatic dysfunction of cervical region: Secondary | ICD-10-CM | POA: Diagnosis not present

## 2019-01-06 DIAGNOSIS — G44209 Tension-type headache, unspecified, not intractable: Secondary | ICD-10-CM | POA: Diagnosis not present

## 2019-01-06 DIAGNOSIS — M47812 Spondylosis without myelopathy or radiculopathy, cervical region: Secondary | ICD-10-CM | POA: Diagnosis not present

## 2019-01-06 MED ORDER — FUROSEMIDE 20 MG PO TABS: 10.0000 mg | ORAL_TABLET | Freq: Every day | ORAL | 3 refills | Status: DC

## 2019-01-06 NOTE — Telephone Encounter (Signed)
-----   Message from Lendon Colonel, NP sent at 01/04/2019  7:26 AM EDT ----- Regarding: Medication change Stanley Wright will need to stop HCTZ and start Lasix 10 mg daily (1/2 tablet of a 20 mg dose) to take daily. He will need to have follow up labs CMET (to include albumin) in one week. He is to purchase knee high support hose for swelling of his legs.   Thank you, KL

## 2019-01-06 NOTE — Telephone Encounter (Signed)
HCTZ was D/C, Furosemide 20 mg take 10 mg daily was send into pt pharmacy, make aware via MyChart

## 2019-01-10 DIAGNOSIS — G44209 Tension-type headache, unspecified, not intractable: Secondary | ICD-10-CM | POA: Diagnosis not present

## 2019-01-10 DIAGNOSIS — M542 Cervicalgia: Secondary | ICD-10-CM | POA: Diagnosis not present

## 2019-01-10 DIAGNOSIS — M9901 Segmental and somatic dysfunction of cervical region: Secondary | ICD-10-CM | POA: Diagnosis not present

## 2019-01-10 DIAGNOSIS — M47812 Spondylosis without myelopathy or radiculopathy, cervical region: Secondary | ICD-10-CM | POA: Diagnosis not present

## 2019-01-13 DIAGNOSIS — M47812 Spondylosis without myelopathy or radiculopathy, cervical region: Secondary | ICD-10-CM | POA: Diagnosis not present

## 2019-01-13 DIAGNOSIS — G44209 Tension-type headache, unspecified, not intractable: Secondary | ICD-10-CM | POA: Diagnosis not present

## 2019-01-13 DIAGNOSIS — M542 Cervicalgia: Secondary | ICD-10-CM | POA: Diagnosis not present

## 2019-01-13 DIAGNOSIS — M9901 Segmental and somatic dysfunction of cervical region: Secondary | ICD-10-CM | POA: Diagnosis not present

## 2019-01-14 NOTE — Progress Notes (Signed)
HPI: FU permanent atrial fibrillation and history of factor V Leiden with 2 previous pulmonary emboli. He is on chronic Coumadin. Abdominal CT 2011 showed no aneurysm. Found to be in atrial fibrillation onpreviouselectrocardiogram. TSH 2.88. Patient placed on Cardizem for rate control. Had successful DCCV 11/22/15.However atrial fibrillation recurred. Holter monitor September 2017 showed atrial fibrillation with PVCs or aberrantly conducted beats. Cardizem increased to 300 mg daily.Nuclear study 2/18 showed no no ischemia or infarction.  Previously treated with hydrochlorothiazide for volume excess.  Since last seen,  there is no dyspnea, chest pain, palpitations, syncope or bleeding.  He has worsening chronic pedal edema.  Current Outpatient Medications  Medication Sig Dispense Refill   carbidopa-levodopa (SINEMET CR) 50-200 MG tablet TAKE 1 TABLET BY MOUTH AT BEDTIME 90 tablet 0   carbidopa-levodopa (SINEMET IR) 25-100 MG tablet TAKE 1 TABLET BY MOUTH THREE TIMES DAILY 90 tablet 3   diltiazem (CARDIZEM CD) 300 MG 24 hr capsule Take 1 capsule (300 mg total) by mouth daily. 90 capsule 3   furosemide (LASIX) 20 MG tablet Take 0.5 tablets (10 mg total) by mouth daily. 45 tablet 3   warfarin (COUMADIN) 5 MG tablet TAKE 1 TABLET BY MOUTH ONCE DAILY OR  AS  DIRECTED  BY  COUMADIN  CLINIC 90 tablet 0   No current facility-administered medications for this visit.      Past Medical History:  Diagnosis Date   Arthritis    BENIGN PROSTATIC HYPERTROPHY, WITH OBSTRUCTION    Cancer ([facility])    skin, basal, squamous   COLONIC POLYPS    Diverticulitis    DVT (deep venous thrombosis) ([facility]) 2000   Dysrhythmia    Hx Afib- 2017   Factor 5 Leiden mutation, heterozygous ([facility])    GERD (gastroesophageal reflux disease)    not on medication   HIATAL HERNIA    ITP (idiopathic thrombocytopenic purpura) 2003   Long term current use of anticoagulant    Parkinson's disease ([facility])  02/03/2018   PSORIASIS    Pulmonary embolus ([facility]) 2000   Shortness of breath dyspnea    at times - Talking alot   Sleep apnea     Past Surgical History:  Procedure Laterality Date   CARDIOVERSION N/A 11/22/2015   Procedure: CARDIOVERSION;  Surgeon: Sanda Klein, MD;  Location: [facility];  Service: Cardiovascular;  Laterality: N/A;   COLONOSCOPY     HERNIA REPAIR Left    Inguinal- x2 . mesh x1   INSERTION OF MESH N/A 02/25/2016   Procedure: INSERTION OF MESH;  Surgeon: Rolm Bookbinder, MD;  Location: [facility];  Service: General;  Laterality: N/A;   LUNG REMOVAL, PARTIAL Right 2004   was fungal and not cancerous   PROSTATE SURGERY     UMBILICAL HERNIA REPAIR N/A 02/25/2016   Procedure: LAPAROSCOPIC UMBILICAL HERNIA REPAIR;  Surgeon: Rolm Bookbinder, MD;  [facility];  Service: General;  Laterality: N/A;    Social History   Socioeconomic History   Marital status: Divorced    Spouse name: Not on file   Number of children: 2   Years of education: Masters   Highest education level: Not on file  Occupational History    Employer: RETIRED    Comment: Engineer, structural  Social Needs   Financial resource strain: Not on file   Food insecurity    Worry: Not on file    Inability: Not on file   Transportation needs    Medical: Not on file  Non-medical: Not on file  Tobacco Use   Smoking status: Former Smoker    Packs/day: 1.00    Quit date: 06/16/1985    Years since quitting: 33.6   Smokeless tobacco: Never Used   Tobacco comment: quit approx age 74-   Substance and Sexual Activity   Alcohol use: No   Drug use: No   Sexual activity: Yes  Lifestyle   Physical activity    Days per week: Not on file    Minutes per session: Not on file   Stress: Not on file  Relationships   Social connections    Talks on phone: Not on file    Gets together: Not on file    Attends religious service: Not on file    Active member of club or organization: Not on  file    Attends meetings of clubs or organizations: Not on file    Relationship status: Not on file   Intimate partner violence    Fear of current or ex partner: Not on file    Emotionally abused: Not on file    Physically abused: Not on file    Forced sexual activity: Not on file  Other Topics Concern   Not on file  Social History Narrative   Lives alone   Caffeine use: Coffee daily   Right handed     Family History  Problem Relation Age of Onset   Cancer Father    Breast cancer Sister    Heart disease Brother    Cancer Maternal Grandmother    Stroke Paternal Grandfather     ROS: no fevers or chills, productive cough, hemoptysis, dysphasia, odynophagia, melena, hematochezia, dysuria, hematuria, rash, seizure activity, orthopnea, PND, pedal edema, claudication. Remaining systems are negative.  Physical Exam: Well-developed obese in no acute distress.  Skin is warm and dry.  HEENT is normal.  Neck is supple.  Chest is clear to auscultation with normal expansion.  Cardiovascular exam is irregular Abdominal exam nontender or distended. No masses palpated. Extremities show 2+ ankle edema. neuro grossly intact  ECG-atrial fibrillation at a rate of 96, nonspecific ST changes.  Personally reviewed  A/P  1 permanent atrial fibrillation-plan to continue Cardizem for rate control.  Continue Coumadin with goal INR 2-3.  2 cardiomyopathy-LV function has improved on most recent echocardiogram.  Previous cardiomyopathy may have been secondary to tachycardia.  3 factor V Leiden-continue Coumadin.  Patient has had previous pulmonary emboli and will require lifelong anticoagulation.  4 chronic pedal edema-persists.  Increase Lasix to 40 mg daily.  We also discussed compression hose.  Check potassium and renal function in 1 week.  Continue low-sodium diet and fluid restriction.  5 Parkinson's-managed by neurology.  Kirk Ruths, MD

## 2019-01-17 DIAGNOSIS — Z79899 Other long term (current) drug therapy: Secondary | ICD-10-CM | POA: Diagnosis not present

## 2019-01-18 LAB — COMPREHENSIVE METABOLIC PANEL
ALT: 6 IU/L (ref 0–44)
AST: 12 IU/L (ref 0–40)
Albumin/Globulin Ratio: 1.5 (ref 1.2–2.2)
Albumin: 4.1 g/dL (ref 3.7–4.7)
Alkaline Phosphatase: 62 IU/L (ref 39–117)
BUN/Creatinine Ratio: 17 (ref 10–24)
BUN: 18 mg/dL (ref 8–27)
Bilirubin Total: 0.4 mg/dL (ref 0.0–1.2)
CO2: 23 mmol/L (ref 20–29)
Calcium: 8.7 mg/dL (ref 8.6–10.2)
Chloride: 103 mmol/L (ref 96–106)
Creatinine, Ser: 1.08 mg/dL (ref 0.76–1.27)
GFR calc Af Amer: 78 mL/min/{1.73_m2} (ref >59)
GFR calc non Af Amer: 68 mL/min/{1.73_m2} (ref >59)
Globulin, Total: 2.7 g/dL (ref 1.5–4.5)
Glucose: 96 mg/dL (ref 65–99)
Potassium: 4.5 mmol/L (ref 3.5–5.2)
Sodium: 141 mmol/L (ref 134–144)
Total Protein: 6.8 g/dL (ref 6.0–8.5)

## 2019-01-19 ENCOUNTER — Other Ambulatory Visit: Payer: Self-pay

## 2019-01-19 ENCOUNTER — Ambulatory Visit: Payer: PPO | Admitting: Cardiology

## 2019-01-19 ENCOUNTER — Encounter: Payer: Self-pay | Admitting: Cardiology

## 2019-01-19 VITALS — BP 104/78 | HR 96 | Temp 97.9°F | Ht 72.0 in | Wt 266.0 lb

## 2019-01-19 DIAGNOSIS — I5032 Chronic diastolic (congestive) heart failure: Secondary | ICD-10-CM

## 2019-01-19 DIAGNOSIS — I4819 Other persistent atrial fibrillation: Secondary | ICD-10-CM

## 2019-01-19 DIAGNOSIS — D6851 Activated protein C resistance: Secondary | ICD-10-CM

## 2019-01-19 MED ORDER — FUROSEMIDE 20 MG PO TABS: 40.0000 mg | ORAL_TABLET | Freq: Every day | ORAL | 3 refills | Status: DC

## 2019-01-19 NOTE — Patient Instructions (Signed)
Medication Instructions:  INCREASE FUROSEMIDE TO 40 MG ONCE DAILY= 2 OF THE 20 MG TABLETS ONCE DAILY  If you need a refill on your cardiac medications before your next appointment, please call your pharmacy.   Lab work: Your physician recommends that you return for lab work in: [place]  If you have labs (blood work) drawn today and your tests are completely normal, you will receive your results only by: Marland Kitchen MyChart Message (if you have MyChart) OR . A paper copy in the mail If you have any lab test that is abnormal or we need to change your treatment, we will call you to review the results.  Follow-Up: At [facility], you and your health needs are our priority.  As part of our continuing mission to provide you with exceptional heart care, we have created designated Provider Care Teams.  These Care Teams include your primary Cardiologist (physician) and Advanced Practice Providers (APPs -  Physician Assistants and Nurse Practitioners) who all work together to provide you with the care you need, when you need it. You will need a follow up appointment in 12 months.  Please call our office 2 months in advance to schedule this appointment.  You may see Kirk Ruths, MD or one of the following Advanced Practice Providers on your designated Care Team:   Kerin Ransom, PA-C Roby Lofts, [place] . Sande Rives, PA-C

## 2019-01-28 DIAGNOSIS — I5032 Chronic diastolic (congestive) heart failure: Secondary | ICD-10-CM | POA: Diagnosis not present

## 2019-01-29 LAB — BASIC METABOLIC PANEL
BUN/Creatinine Ratio: 13 (ref 10–24)
BUN: 13 mg/dL (ref 8–27)
CO2: 25 mmol/L (ref 20–29)
Calcium: 9 mg/dL (ref 8.6–10.2)
Chloride: 102 mmol/L (ref 96–106)
Creatinine, Ser: 0.98 mg/dL (ref 0.76–1.27)
GFR calc Af Amer: 88 mL/min/{1.73_m2} (ref >59)
GFR calc non Af Amer: 76 mL/min/{1.73_m2} (ref >59)
Glucose: 93 mg/dL (ref 65–99)
Potassium: 4.6 mmol/L (ref 3.5–5.2)
Sodium: 140 mmol/L (ref 134–144)

## 2019-02-01 ENCOUNTER — Ambulatory Visit (INDEPENDENT_AMBULATORY_CARE_PROVIDER_SITE_OTHER): Payer: PPO | Admitting: *Deleted

## 2019-02-01 ENCOUNTER — Other Ambulatory Visit: Payer: Self-pay

## 2019-02-01 DIAGNOSIS — I4819 Other persistent atrial fibrillation: Secondary | ICD-10-CM

## 2019-02-01 DIAGNOSIS — Z5181 Encounter for therapeutic drug level monitoring: Secondary | ICD-10-CM

## 2019-02-01 LAB — POCT INR: INR: 1.7 — AB (ref 2.0–3.0)

## 2019-02-01 NOTE — Patient Instructions (Signed)
Description    Take 1.5 tablet today, then continue same dosage of 1 tablet every day. Recheck INR in 3 week. Call with any questions if gets any new medications or scheduled for any procedures  [phone]

## 2019-02-11 ENCOUNTER — Ambulatory Visit: Payer: PPO | Admitting: Neurology

## 2019-02-17 ENCOUNTER — Other Ambulatory Visit: Payer: Self-pay

## 2019-02-17 ENCOUNTER — Ambulatory Visit (INDEPENDENT_AMBULATORY_CARE_PROVIDER_SITE_OTHER): Payer: PPO | Admitting: *Deleted

## 2019-02-17 DIAGNOSIS — I4819 Other persistent atrial fibrillation: Secondary | ICD-10-CM | POA: Diagnosis not present

## 2019-02-17 DIAGNOSIS — Z5181 Encounter for therapeutic drug level monitoring: Secondary | ICD-10-CM | POA: Diagnosis not present

## 2019-02-17 LAB — POCT INR: INR: 2.2 (ref 2.0–3.0)

## 2019-02-17 NOTE — Patient Instructions (Signed)
Description   Continue same dosage of 1 tablet every day. Recheck INR in 4 weeks. Call with any questions if gets any new medications or scheduled for any procedures  [phone]

## 2019-02-24 ENCOUNTER — Ambulatory Visit: Payer: PPO | Attending: Neurology | Admitting: Physical Therapy

## 2019-02-24 ENCOUNTER — Ambulatory Visit: Payer: PPO | Admitting: Occupational Therapy

## 2019-02-24 ENCOUNTER — Ambulatory Visit: Payer: PPO

## 2019-02-24 ENCOUNTER — Other Ambulatory Visit: Payer: Self-pay

## 2019-02-24 ENCOUNTER — Telehealth: Payer: Self-pay | Admitting: Occupational Therapy

## 2019-02-24 DIAGNOSIS — R278 Other lack of coordination: Secondary | ICD-10-CM | POA: Insufficient documentation

## 2019-02-24 DIAGNOSIS — R2689 Other abnormalities of gait and mobility: Secondary | ICD-10-CM

## 2019-02-24 DIAGNOSIS — R471 Dysarthria and anarthria: Secondary | ICD-10-CM | POA: Insufficient documentation

## 2019-02-24 DIAGNOSIS — G20 Parkinson's disease: Secondary | ICD-10-CM

## 2019-02-24 NOTE — Telephone Encounter (Signed)
Dr. Carles Collet, Leeanne Rio was seen for PD screens today.  He demonstrates an overall functional decline and can benefit from referrals to PT, OT, ST. If you agree, please place these referrals. Thanks, [employer], OTR/L

## 2019-02-24 NOTE — Therapy (Signed)
[facility] [address] [place], [place], [address] Phone: [phone]   Fax:  [phone]  Patient Details  Name: Stanley Wright MRN: [id number] Date of Birth: [birth year] Referring Provider:  Ludwig Clarks, DO  Encounter Date: 02/24/2019  Speech Therapy Parkinson's Disease Screen   Decibel Level today: mid 60s dB  (WNL=70-72 dB) with sound level meter 30cm away from pt's mouth. Pt's conversational volume has decreased since last treatment course. Additionally, SLP noted pt has begun to exhibit short rushes of speech often seen in pts with PSP.  Pt does not report experiencing difficulty in swallowing warranting further evaluation.  Pt would benefit from speech-language eval for dysarthria - please order via EPIC if agreed.    [facility] ,[place], [place]  02/24/2019, 12:16 PM  [facility] [address] [place], [place], [address] Phone: [phone]   Fax:  [phone]

## 2019-02-24 NOTE — Telephone Encounter (Signed)
Please place referral. Thanks!

## 2019-02-24 NOTE — Telephone Encounter (Signed)
Orders placed.

## 2019-02-24 NOTE — Therapy (Addendum)
Physical Therapy Parkinson's Disease Screen  Pt denies falls but had lots of close calls.  Pt reports not doing 'nearly enough'.  Is doing [employer] app exercise program.  Reports having L foot pain for a couple weeks after stepping down a step and "landing wrong".  Has not seen MD but does see Dr Tat tomorrow.   Timed Up and Go test:10.22 sec but several staggered steps and feet catching on floor.  10 meter walk test:3.27 ft/sec  5 time sit to stand test:11.79 seconds but significant LOB posteriorly.  Improved stability if hands allowed.     Narda Bonds, [place] 02/24/19 9:09 AM Phone: [phone] Fax: [phone]      Patient would benefit from Physical Therapy evaluation due to noted loss of balance, decreased stability during PT screen.  PTA made PT aware of above information, and request made for PT referral.   Mady Haagensen, PT 02/25/19 2:49 PM Phone: [phone] Fax: [phone]

## 2019-02-24 NOTE — Therapy (Deleted)
[facility] [address] [place], [place], [address] Phone: [phone]   Fax:  [phone]  Physical Therapy Treatment  Patient Details  Name: SRIJAN BRUNGARDT MRN: [id number] Date of Birth: Feb 05, 1946 Referring Provider (PT): Tat, Wells Guiles   Encounter Date: 02/24/2019    Past Medical History:  Diagnosis Date  . Arthritis   . BENIGN PROSTATIC HYPERTROPHY, WITH OBSTRUCTION   . Cancer ([facility])    skin, basal, squamous  . COLONIC POLYPS   . Diverticulitis   . DVT (deep venous thrombosis) ([facility]) 2000  . Dysrhythmia    Hx Afib- 2017  . Factor 5 Leiden mutation, heterozygous ([facility])   . GERD (gastroesophageal reflux disease)    not on medication  . HIATAL HERNIA   . ITP (idiopathic thrombocytopenic purpura) 2003  . Long term current use of anticoagulant   . Parkinson's disease ([facility]) 02/03/2018  . PSORIASIS   . Pulmonary embolus ([facility]) 2000  . Shortness of breath dyspnea    at times - Talking alot  . Sleep apnea     Past Surgical History:  Procedure Laterality Date  . CARDIOVERSION N/A 11/22/2015   Procedure: CARDIOVERSION;  Surgeon: Sanda Klein, MD;  [facility];  Service: Cardiovascular;  Laterality: N/A;  . COLONOSCOPY    . HERNIA REPAIR Left    Inguinal- x2 . mesh x1  . INSERTION OF MESH N/A 02/25/2016   Procedure: INSERTION OF MESH;  Surgeon: Rolm Bookbinder, MD;  Location: [facility];  Service: General;  Laterality: N/A;  . LUNG REMOVAL, PARTIAL Right 2004   was fungal and not cancerous  . PROSTATE SURGERY    . UMBILICAL HERNIA REPAIR N/A 02/25/2016   Procedure: LAPAROSCOPIC UMBILICAL HERNIA REPAIR;  Surgeon: Rolm Bookbinder, MD;  Location: [facility];  Service: General;  Laterality: N/A;    There were no vitals filed for this visit.       Patient would benefit from Physical Therapy evaluation due to ***  Narda Bonds, [place] 02/24/19 8:26  AM Phone: [phone] Fax: [phone]                                PT Long Term Goals - 07/16/18 1041      PT LONG TERM GOAL #1   Title  Pt will verbalize/demonstrate understanding of lifting/carrying objects with bilateral UEs, for improved safety with gait.    Time  1    Period  Days    Status  Achieved              Patient will benefit from skilled therapeutic intervention in order to improve the following deficits and impairments:     Visit Diagnosis: No diagnosis found.     Problem List Patient Active Problem List   Diagnosis Date Noted  . Parkinson's disease ([facility]) 02/03/2018  . Community acquired pneumonia of left lower lobe of lung ([facility]) 12/23/2017  . Sepsis ([facility]) 12/23/2017  . Morbid obesity due to excess calories ([facility]) 08/23/2016  . OSA on CPAP 08/23/2016  . Dyspnea on exertion 08/22/2016  . Umbilical hernia [id number]  . Persistent atrial fibrillation   . Encounter for therapeutic drug monitoring 08/12/2013  . Long term current use of anticoagulant 07/30/2010  . COLONIC POLYPS 06/03/2010  . 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 GERD 06/03/2010  . HIATAL HERNIA 06/03/2010  . BENIGN PROSTATIC HYPERTROPHY, WITH OBSTRUCTION 06/03/2010  . PSORIASIS 06/03/2010  Lesli Albee 02/24/2019, 8:21 AM  [facility] [address] [place], [place], [address] Phone: [phone]   Fax:  [phone]  Name: HAOCHEN HECHT MRN: [id number] Date of Birth: 10-23-1945

## 2019-02-25 ENCOUNTER — Ambulatory Visit (INDEPENDENT_AMBULATORY_CARE_PROVIDER_SITE_OTHER): Payer: PPO | Admitting: Neurology

## 2019-02-25 DIAGNOSIS — G245 Blepharospasm: Secondary | ICD-10-CM | POA: Diagnosis not present

## 2019-02-25 MED ORDER — ONABOTULINUMTOXINA 100 UNITS IJ SOLR
25.0000 [IU] | Freq: Once | INTRAMUSCULAR | Status: AC
  Administered 2019-02-25: 25 [IU] via INTRAMUSCULAR

## 2019-02-25 NOTE — Therapy (Signed)
[facility] [address] [place], [place], [address] Phone: [phone]   Fax:  [phone]  Patient Details  Name: Stanley Wright MRN: [id number] Date of Birth: [birth year] Referring Provider:  Filiberto Pinks  Encounter Date: 02/24/2019 Occupational Therapy Parkinson's Disease Screen  Box & Blocks Test:   RUE  56 blocks        LUE  53 blocks  PPT#4: 9.56  Change in ability to perform ADLs/IADLs:  Decreased legibility of handwriting  Other Comments:  Pt requests to participate in OT to work on handwriting. Pt not had OT previously and can benefit from OT to maintain  functional independence.  Pt would benefit from occupational therapy evaluation due to  difficulty with handwriting, need for PD education.    Jacy Brocker 02/25/2019, 4:00 PM  [facility] [address] [place], [place], [address] Phone: [phone]   Fax:  [phone]

## 2019-02-25 NOTE — Procedures (Signed)
[facility]   Procedure Note Botox  Attending: Dr. 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  Risk discussed included, but were not limited pain and discomfort, bleeding, bruising, excessive weakness, venous thrombosis, muscle atrophy and drooping of eyelids.  A copy of the patient medication guide was given to the patient which explains the blackbox warning.  Patients identity and treatment sites confirmed Yes.  .  Details of Procedure: Skin was cleaned with alcohol.  A 30 gauge, 1/2 inch needle was introduced to the target muscle.  Prior to injection, the needle plunger was aspirated to make sure the needle was not within a blood vessel.  There was no blood retrieved on aspiration.    Following is a summary of the muscles injected  And the amount of Botulinum toxin used:   Dilution 0.9% preservative free saline mixed with 100 u Botox type A to make 5 U per 0.1cc (2 cc of saline used per 100 U botox)  Injections  Location Left  Right Units  Upper lateral orbicularis oculi  2.5 2.5 5.0  Upper medial orbicularis oculi      Lateral orbicularis oculi  5.0 5.0 10.0  Lower lateral orbicularis oculi  2.5 2.5 5.0  Lower medial orbicularis oculi 2.5 2.5 5.0  Trapezius          TOTAL UNITS:   25.0   Agent: Botulinum Type A ( Onobotulinum Toxin type A ).  1 vials of Botox were used, each containing 50 units and freshly diluted with 2 mL of sterile, non-perserved saline   Total injected (Units): 25  Total wasted (Units): 20   Pt tolerated procedure well without complications.   Reinjection is anticipated in 3 months.

## 2019-02-26 IMAGING — MR MR HEAD WO/W CM
12 series · 48 of 48 positions shown · IV contrast (multihance)
Comparison: None.

CLINICAL DATA: Slurred speech and gait disturbance over the last 3
months. Hearing loss. Weakness.

Creatinine was obtained on site at [HOSPITAL] at [HOSPITAL].
Results: Creatinine 1.2 mg/dL.
EXAM:
MRI HEAD WITHOUT AND WITH CONTRAST
TECHNIQUE: Multiplanar, multiecho pulse sequences of the brain and surrounding
structures were obtained without and with intravenous contrast.
CONTRAST:  20mL MULTIHANCE GADOBENATE DIMEGLUMINE 529 MG/ML IV SOLN

[Series 5: T1 · sagittal · 4.0mm · 0.75mm/px · 1 of 33 slices shown (1 of 3)]
[im 1/33]
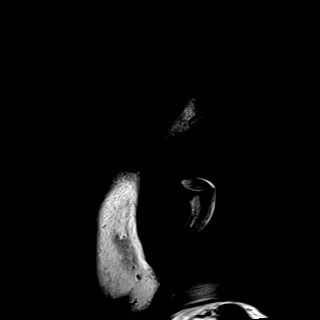

[Series 6: DWI · axial · 3.0mm · 1.50mm/px · z∈[-88,+69]mm · 6 of 100 slices shown (1 of 4)]
[im 1/100]
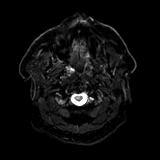
[im 20/100]
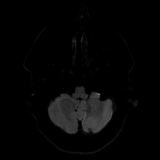
[im 40/100]
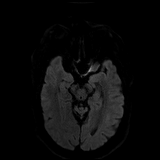
[im 60/100]
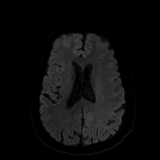
[im 80/100]
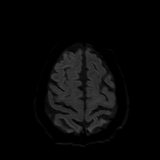
[im 100/100]
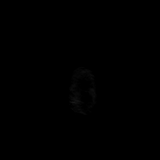

[Series 7: DWI · axial · 3.0mm · 1.50mm/px · z∈[-88,+69]mm · 3 of 50 slices shown (2 of 4)]
[im 1/50]
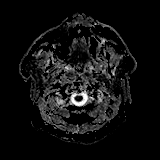
[im 25/50]
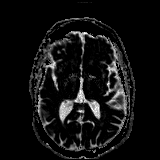
[im 50/50]
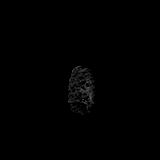

[Series 8: DWI · coronal · 5.0mm · 1.44mm/px · 4 of 72 slices shown (3 of 4)]
[im 1/72]
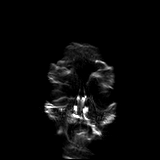
[im 24/72]
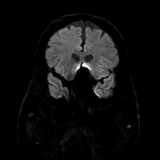
[im 48/72]
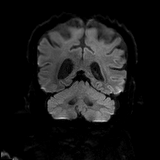
[im 72/72]
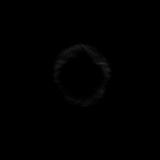

[Series 9: DWI · coronal · 5.0mm · 1.44mm/px · 2 of 36 slices shown (4 of 4)]
[im 1/36]
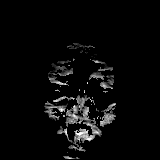
[im 36/36]
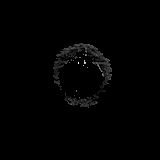

[Series 10: T2 · axial · 4.0mm · 0.38mm/px · z∈[-83,+74]mm · 2 of 32 slices shown]
[im 1/32]
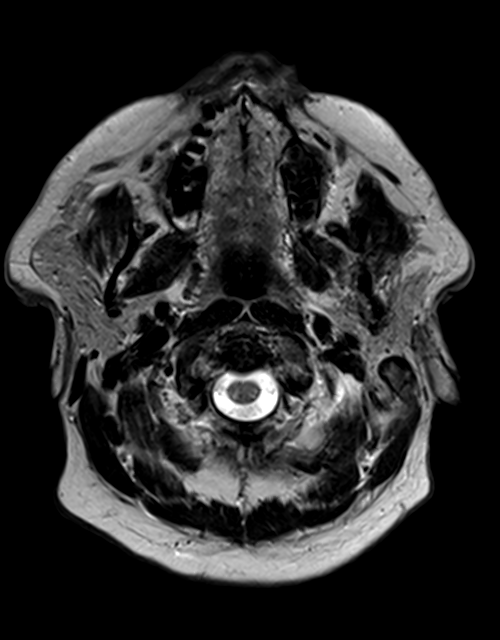
[im 32/32]
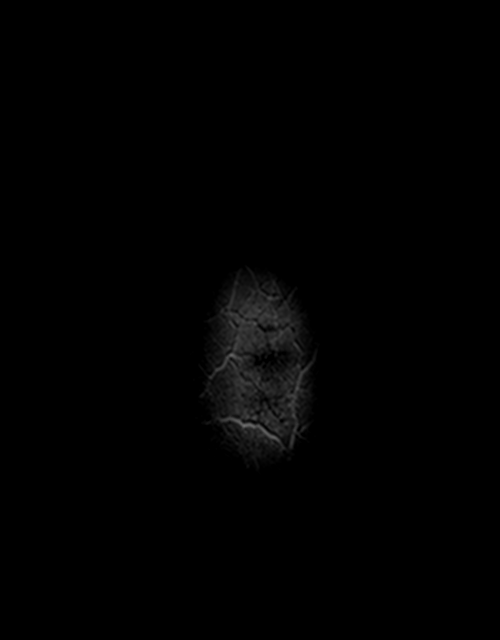

[Series 11: FLAIR · axial · 3.0mm · 0.75mm/px · z∈[-89,+69]mm · 2 of 28 slices shown]
[im 1/28]
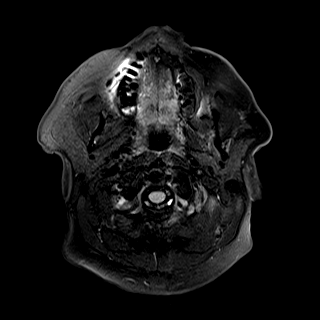
[im 28/28]
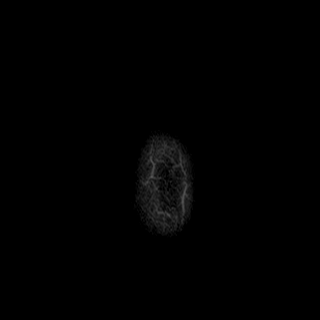

[Series 13: swi_images · axial · 1.5mm · 0.94mm/px · z∈[-80,+71]mm · 6 of 104 slices shown]
[im 1/104]
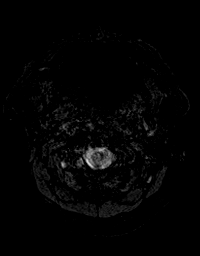
[im 21/104]
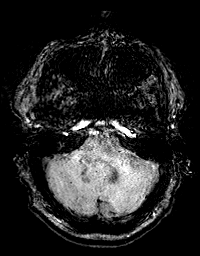
[im 42/104]
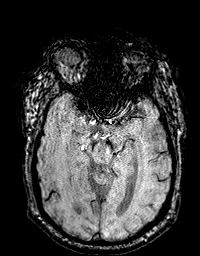
[im 62/104]
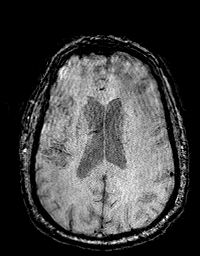
[im 83/104]
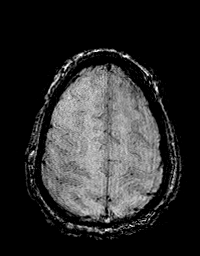
[im 104/104]
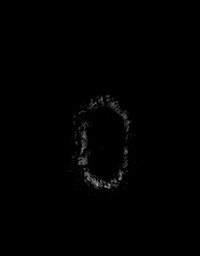

[Series 14: T1 · axial · 1.0mm · 0.94mm/px · z∈[-81,+73]mm · 9 of 160 slices shown (2 of 3)]
[im 1/160]
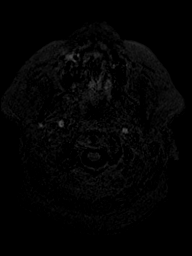
[im 20/160]
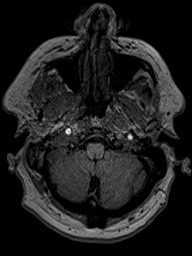
[im 40/160]
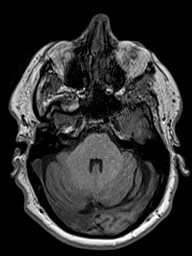
[im 60/160]
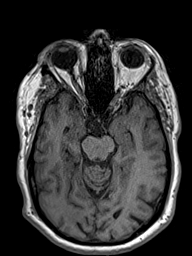
[im 80/160]
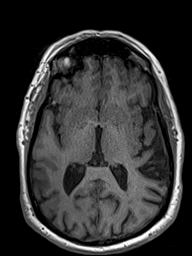
[im 100/160]
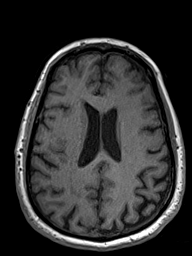
[im 120/160]
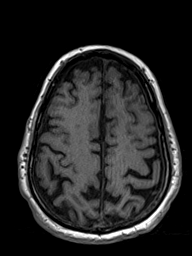
[im 140/160]
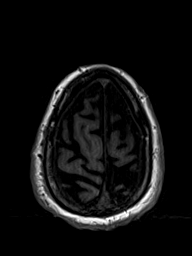
[im 160/160]
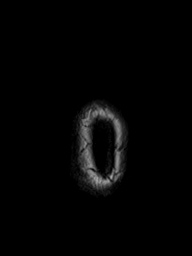

[Series 15: T2 post-contrast · coronal · 4.5mm · 0.36mm/px · 2 of 34 slices shown]
[im 1/34]
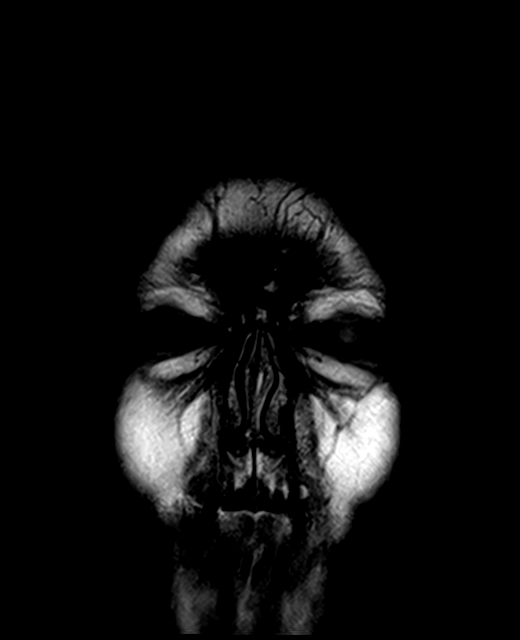
[im 34/34]
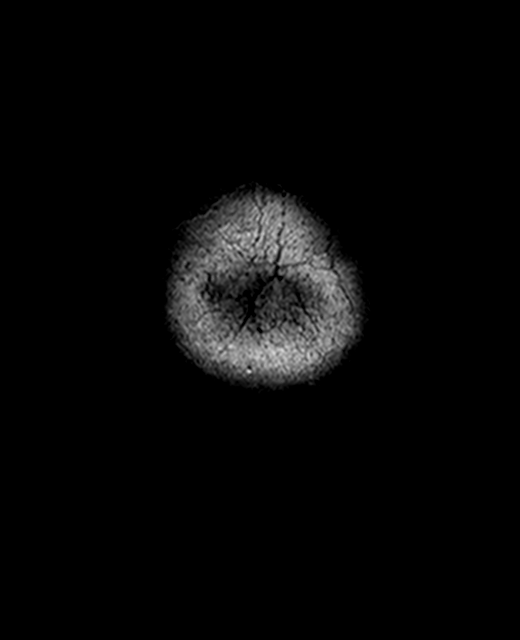

[Series 16: T1 · axial · 1.0mm · 0.94mm/px · z∈[-81,+73]mm · 9 of 160 slices shown (3 of 3)]
[im 1/160]
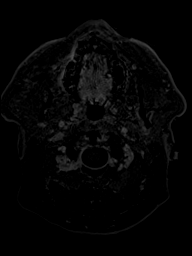
[im 20/160]
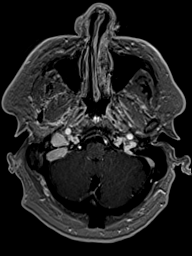
[im 40/160]
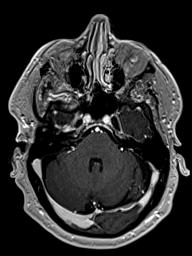
[im 60/160]
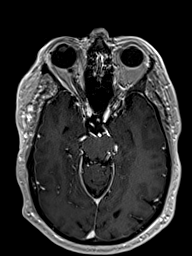
[im 80/160]
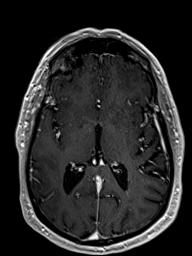
[im 100/160]
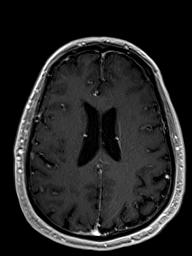
[im 120/160]
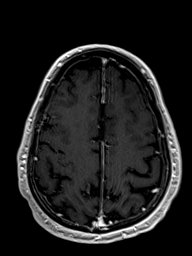
[im 140/160]
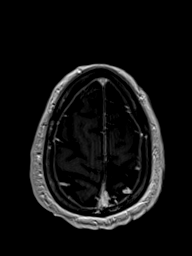
[im 160/160]
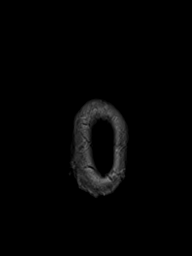

[Series 17: T1 post-contrast · coronal · 4.5mm · 0.72mm/px · 2 of 34 slices shown]
[im 1/34]
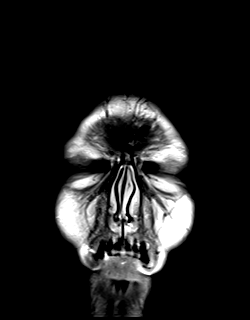
[im 34/34]
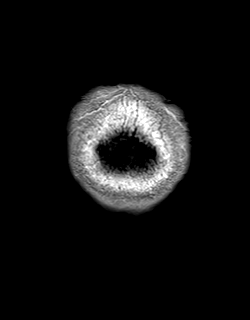

[48 of 48 positions shown; findings below may reference images not displayed]

FINDINGS: Brain: Diffusion imaging does not show any acute or subacute
infarction. The brainstem and cerebellum are normal. Cerebral
hemispheres show moderate chronic small-vessel ischemic changes of
the deep and subcortical white matter. No cortical or large vessel
territory infarction. No mass lesion, hemorrhage, hydrocephalus or
extra-axial collection. After contrast administration, no abnormal
enhancement occurs.

Vascular: Major vessels at the base of the brain show flow.

Skull and upper cervical spine: Negative

Sinuses/Orbits: Clear/normal

Other: None
IMPRESSION: No acute or reversible finding. No specific cause of the presenting
symptoms is identified. Moderate chronic small-vessel ischemic
changes of the cerebral hemispheric white matter.

## 2019-03-18 ENCOUNTER — Other Ambulatory Visit: Payer: Self-pay

## 2019-03-18 ENCOUNTER — Other Ambulatory Visit: Payer: Self-pay | Admitting: Physician Assistant

## 2019-03-18 ENCOUNTER — Ambulatory Visit (INDEPENDENT_AMBULATORY_CARE_PROVIDER_SITE_OTHER): Payer: PPO | Admitting: *Deleted

## 2019-03-18 ENCOUNTER — Ambulatory Visit
Admission: RE | Admit: 2019-03-18 | Discharge: 2019-03-18 | Disposition: A | Discharge status: Home / Self Care | Payer: PPO | Source: Ambulatory Visit | Attending: Physician Assistant | Admitting: Physician Assistant

## 2019-03-18 DIAGNOSIS — M19071 Primary osteoarthritis, right ankle and foot: Secondary | ICD-10-CM | POA: Diagnosis not present

## 2019-03-18 DIAGNOSIS — Z5181 Encounter for therapeutic drug level monitoring: Secondary | ICD-10-CM

## 2019-03-18 DIAGNOSIS — M79671 Pain in right foot: Secondary | ICD-10-CM

## 2019-03-18 DIAGNOSIS — I4819 Other persistent atrial fibrillation: Secondary | ICD-10-CM | POA: Diagnosis not present

## 2019-03-18 LAB — POCT INR: INR: 1.8 — AB (ref 2.0–3.0)

## 2019-03-18 NOTE — Patient Instructions (Signed)
Description   Today take 1.5 tablets then continue same dosage of 1 tablet every day. Recheck INR in 3 weeks. Call with any questions if gets any new medications or scheduled for any procedures  [phone]

## 2019-03-21 MED ORDER — CARBIDOPA-LEVODOPA ER 50-200 MG PO TBCR: 1.0000 | EXTENDED_RELEASE_TABLET | Freq: Every day | ORAL | 0 refills | Status: DC

## 2019-03-21 MED ORDER — CARBIDOPA-LEVODOPA 25-100 MG PO TABS: ORAL_TABLET | ORAL | 1 refills | Status: DC

## 2019-03-21 NOTE — Telephone Encounter (Signed)
Dr. Doristine Devoid patient

## 2019-03-24 ENCOUNTER — Ambulatory Visit: Payer: PPO | Attending: Neurology | Admitting: Physical Therapy

## 2019-03-24 ENCOUNTER — Other Ambulatory Visit: Payer: Self-pay

## 2019-03-24 ENCOUNTER — Encounter: Payer: Self-pay | Admitting: Occupational Therapy

## 2019-03-24 ENCOUNTER — Ambulatory Visit: Payer: PPO | Admitting: Occupational Therapy

## 2019-03-24 ENCOUNTER — Ambulatory Visit: Payer: PPO

## 2019-03-24 ENCOUNTER — Encounter: Payer: Self-pay | Admitting: Physical Therapy

## 2019-03-24 DIAGNOSIS — R471 Dysarthria and anarthria: Secondary | ICD-10-CM

## 2019-03-24 DIAGNOSIS — R2681 Unsteadiness on feet: Secondary | ICD-10-CM

## 2019-03-24 DIAGNOSIS — R278 Other lack of coordination: Secondary | ICD-10-CM

## 2019-03-24 DIAGNOSIS — R29818 Other symptoms and signs involving the nervous system: Secondary | ICD-10-CM | POA: Diagnosis not present

## 2019-03-24 DIAGNOSIS — R2689 Other abnormalities of gait and mobility: Secondary | ICD-10-CM

## 2019-03-25 NOTE — Therapy (Signed)
[facility] [address] [place], [place], [address] Phone: [phone]   Fax:  [phone]  Speech Language Pathology Evaluation  Patient Details  Name: Stanley Wright MRN: [id number] Date of Birth: [birth year] Referring Provider (SLP): Alonza Bogus, DO   Encounter Date: 03/24/2019  End of Session - 03/25/19 0848    Visit Number  1    Number of Visits  17    Date for SLP Re-Evaluation  06/22/19    SLP Start Time  [id number]    SLP Stop Time   1445    SLP Time Calculation (min)  42 min    Activity Tolerance  Patient tolerated treatment well       Past Medical History:  Diagnosis Date  . Arthritis   . BENIGN PROSTATIC HYPERTROPHY, WITH OBSTRUCTION   . Cancer (HCC)    skin, basal, squamous  . COLONIC POLYPS   . Diverticulitis   . DVT (deep venous thrombosis) ([place]) 2000  . Dysrhythmia    Hx Afib- 2017  . Factor 5 Leiden mutation, heterozygous ([place])   . GERD (gastroesophageal reflux disease)    not on medication  . HIATAL HERNIA   . ITP (idiopathic thrombocytopenic purpura) 2003  . Long term current use of anticoagulant   . Parkinson's disease ([place]) 02/03/2018  . PSORIASIS   . Pulmonary embolus ([place]) 2000  . Shortness of breath dyspnea    at times - Talking alot  . Sleep apnea     Past Surgical History:  Procedure Laterality Date  . CARDIOVERSION N/A 11/22/2015   Procedure: CARDIOVERSION;  Surgeon: Sanda Klein, MD;  [facility];  Service: Cardiovascular;  Laterality: N/A;  . COLONOSCOPY    . HERNIA REPAIR Left    Inguinal- x2 . mesh x1  . INSERTION OF MESH N/A 02/25/2016   Procedure: INSERTION OF MESH;  Surgeon: Rolm Bookbinder, MD;  Location: [facility];  Service: General;  Laterality: N/A;  . LUNG REMOVAL, PARTIAL Right 2004   was fungal and not cancerous  . PROSTATE SURGERY    . UMBILICAL HERNIA REPAIR N/A 02/25/2016   Procedure: LAPAROSCOPIC UMBILICAL HERNIA REPAIR;  Surgeon: Rolm Bookbinder,  MD;  Location: [facility];  Service: General;  Laterality: N/A;    There were no vitals filed for this visit.  Subjective Assessment - 03/25/19 0842    Subjective  Pt speaking at mid 60s level upon entry into ST room.         SLP Evaluation [facility] - 03/25/19 0001      SLP Visit Information   SLP Received On  03/24/19    Referring Provider (SLP)  Alonza Bogus, DO    Onset Date  Sept 2019    Medical Diagnosis  Parkinsonism      General Information   HPI  Pt with PD diagnosis in 1-2 yers aga and was referred to this clinic August 2019 and had 18 ST visits until November 2019. PT returns today as his speech has gotten softer and was ID'd at a screening that he would benefit from skilled ST.       Prior Functional Status   Cognitive/Linguistic Baseline  Within functional limits      Cognition   Overall Cognitive Status  Within Functional Limits for tasks assessed      Auditory Comprehension   Overall Auditory Comprehension  Appears within functional limits for tasks assessed      Verbal Expression   Overall Verbal Expression  Appears within  functional limits for tasks assessed      Oral Motor/Sensory Function   Overall Oral Motor/Sensory Function  --   deferred due to clinic masking policy/COVID 19 exposure risk     Motor Speech   Overall Motor Speech  Impaired    Phonation  Low vocal intensity;Other (comment)   mid 10s dB-conversation   Intelligibility  Intelligible    Effective Techniques  Increased vocal intensity   Hey-AH upper 80s low 90s dB; then reading low-mid 70s dB   Phonation  --   little carryover of WNL loudness to conversation after [facility]                     SLP Education - 03/25/19 0847    Education Details  HEY-AH!, rationalefor HEY AH (re-educated)    Person(s) Educated  Patient    Methods  Explanation;Demonstration;Verbal cues    Comprehension  Verbalized understanding;Returned demonstration;Verbal cues required;Need further instruction        SLP Short Term Goals - 03/25/19 0928      SLP SHORT TERM GOAL #1   Title  pt will produce loud /a/or "Hey-ah" at average low 90s dB over 4 sessions    Time  4    Period  Weeks    Status  New      SLP SHORT TERM GOAL #2   Title  pt will generate 18/20 sentences with WNL average volume over 3 sessions    Time  4    Period  Weeks    Status  New      SLP SHORT TERM GOAL #3   Title  pt will use abdominal breathing at rest 85% success over 2 sessions    Time  4    Period  Weeks    Status  New      SLP SHORT TERM GOAL #4   Title  In 5 minutes simple conversation pt will achieve average speech volume of low 70s dB over two sessions    Time  4    Period  Weeks    Status  New       SLP Long Term Goals - 03/25/19 0929      SLP LONG TERM GOAL #1   Title  Pt will generate loud /a/ with average of low 90s dB over total of 6 sessions    Time  8    Period  Weeks   or 17 sessions, for all LTGs   Status  New      SLP LONG TERM GOAL #2   Title  pt will use abdominal breathing 70% of the time in 8 minutes simple-mod complex conversation    Time  8    Period  Weeks    Status  New      SLP LONG TERM GOAL #3   Title  In 10 minutes simple-mod complex conversation pt will achieve average speech volume of low 70s dB over four sessions    Time  8    Period  Weeks    Status  New       Plan - 03/25/19 0849    Clinical Impression Statement  Pt presents today with 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 Pt reports WNL eating/drinking, denying overt s/s aspiration during meals, however pt may require objective swallow eval during this therapy course (MBSS, FEES). Pt talked at suboptimal dB (mid 60s dB average) until SLP  did loud "hey-AH" with pt (average upper 80s-low 90s dB). Then pt was told to use same effort as with loud "hey-ah" and pt improved loudness in short, structured conversation (1 minute) to  lower 70s dB average. Reading average loudness was higher, however pt had short rushes of speech which decr'd his overallintelligibility. SLP believes pt will benefit from skilled ST targeting incr'd volume and greater usage of abdomoinal breathing in order to improve communicative effectiveness.    Speech Therapy Frequency  2x / week    Duration  --   8 weeks or 17 sessions   Treatment/Interventions  SLP instruction and feedback;Compensatory strategies;Patient/family education;Functional tasks;Cueing hierarchy;Multimodal communcation approach;Environmental controls;Aspiration precaution training;Pharyngeal strengthening exercises;Diet toleration management by SLP    Potential to Achieve Goals  Good    Consulted and Agree with Plan of Care  Patient       Patient will benefit from skilled therapeutic intervention in order to improve the following deficits and impairments:   Dysarthria and anarthria    Problem List Patient Active Problem List   Diagnosis Date Noted  . Parkinson's disease ([place]) 02/03/2018  . Community acquired pneumonia of left lower lobe of lung 12/23/2017  . Sepsis ([place]) 12/23/2017  . Morbid obesity due to excess calories ([place]) 08/23/2016  . OSA on CPAP 08/23/2016  . Dyspnea on exertion 08/22/2016  . Umbilical hernia [id number]  . Persistent atrial fibrillation ([place])   . Encounter for therapeutic drug monitoring 08/12/2013  . Long term current use of anticoagulant 07/30/2010  . COLONIC POLYPS 06/03/2010  . 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 GERD 06/03/2010  . HIATAL HERNIA 06/03/2010  . BENIGN PROSTATIC HYPERTROPHY, WITH OBSTRUCTION 06/03/2010  . PSORIASIS 06/03/2010    [facility] ,[place], [place]  03/25/2019, 9:31 AM  [facility] [address] [place], [place], [address] Phone: [phone]   Fax:  [phone]  Name: Stanley Wright MRN: [id number] Date of Birth: 1946/04/09

## 2019-03-25 NOTE — Therapy (Signed)
[facility] [address] [place], [place], [address] Phone: [phone]   Fax:  [phone]  Occupational Therapy Evaluation  Patient Details  Name: Stanley Wright MRN: [id number] Date of Birth: 1945-10-20 Referring Provider (OT): Dr. Carles Collet   Encounter Date: 03/24/2019  OT End of Session - 03/24/19 1451    Visit Number  1    Number of Visits  11    Date for OT Re-Evaluation  05/01/19    Authorization Type  [employer]    Authorization - Visit Number  1    Authorization - Number of Visits  10    OT Start Time  1450    OT Stop Time  1530    OT Time Calculation (min)  40 min    Activity Tolerance  Patient tolerated treatment well    Behavior During Therapy  [facility] for tasks assessed/performed       Past Medical History:  Diagnosis Date  . Arthritis   . BENIGN PROSTATIC HYPERTROPHY, WITH OBSTRUCTION   . Cancer (HCC)    skin, basal, squamous  . COLONIC POLYPS   . Diverticulitis   . DVT (deep venous thrombosis) ([place]) 2000  . Dysrhythmia    Hx Afib- 2017  . Factor 5 Leiden mutation, heterozygous ([place])   . GERD (gastroesophageal reflux disease)    not on medication  . HIATAL HERNIA   . ITP (idiopathic thrombocytopenic purpura) 2003  . Long term current use of anticoagulant   . Parkinson's disease ([place]) 02/03/2018  . PSORIASIS   . Pulmonary embolus ([place]) 2000  . Shortness of breath dyspnea    at times - Talking alot  . Sleep apnea     Past Surgical History:  Procedure Laterality Date  . CARDIOVERSION N/A 11/22/2015   Procedure: CARDIOVERSION;  Surgeon: Sanda Klein, MD;  [facility];  Service: Cardiovascular;  Laterality: N/A;  . COLONOSCOPY    . HERNIA REPAIR Left    Inguinal- x2 . mesh x1  . INSERTION OF MESH N/A 02/25/2016   Procedure: INSERTION OF MESH;  Surgeon: Rolm Bookbinder, MD;  Location: [facility];  Service: General;  Laterality: N/A;  . LUNG REMOVAL, PARTIAL Right 2004   was fungal and  not cancerous  . PROSTATE SURGERY    . UMBILICAL HERNIA REPAIR N/A 02/25/2016   Procedure: LAPAROSCOPIC UMBILICAL HERNIA REPAIR;  Surgeon: Rolm Bookbinder, MD;  Location: [facility];  Service: General;  Laterality: N/A;    There were no vitals filed for this visit.  Subjective Assessment - 03/24/19 1449    Currently in Pain?  No/denies   foot pain at times though       [facility] OT Assessment - 03/25/19 0001      Assessment   Medical Diagnosis  Parkinson's disease    Referring Provider (OT)  Dr. Carles Collet    Onset Date/Surgical Date  02/24/19    Hand Dominance  Right      Precautions   Precautions  Fall      Balance Screen   Has the patient fallen in the past 6 months  No    Has the patient had a decrease in activity level because of a fear of falling?   No    Is the patient reluctant to leave their home because of a fear of falling?   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    Leisure  [employer], Doing exercises online       ADL   Eating/Feeding  Modified independent   increased spllls   Grooming  Modified independent    Upper Body Bathing  Modified independent    Lower Body Bathing  Modified independent    Upper Body Dressing  Increased time   modified independent   Lower Body Dressing  Modified independent    Community education officer  Modified independent      IADL   Shopping  Takes care of all shopping needs independently    [place] alone or with occasional assistance    Meal Prep  Able to complete simple warm meal prep    Medication Management  Is responsible for taking medication in correct dosages at correct time    Financial  Management  --   online bill pay     Mobility   Mobility Status  Independent      Written Expression   Handwriting  50% legible;Mild micrographia   sentence level     Cognition   Overall Cognitive Status  Within Functional Limits for tasks assessed    Cognition Comments  Pt reprots some short term memory deficits at times      Observation/Other Assessments   Other Surveys   Select    Physical Performance Test    Yes    Simulated Eating Time (seconds)  11.51 secs    Donning Doffing Jacket Time (seconds)  9.56    Donning Doffing Jacket Comments  3 button/ unbutton 53.03 secs      Posture/Postural Control   Posture/Postural Control  Postural limitations    Postural Limitations  Rounded Shoulders;Forward head      Coordination   9 Hole Peg Test  Right;Left    Right 9 Hole Peg Test  24.25 secs    Left 9 Hole Peg Test  24.10 secs     Box and Blocks  from 9/10 screen:RUE 56, LUE 53      ROM / Strength   AROM / PROM / Strength  AROM      AROM   Overall AROM   Deficits    Overall AROM Comments  RUE shoulder flexion 125, elbow extension -5, LUE shoulder flexion 120, elbow extension-5                           OT Long Term Goals - 03/25/19 1659      OT LONG TERM GOAL #1   Title  I with PD specific HEP.    Time  5    Period  Weeks    Status  New    Target Date  05/01/19      OT LONG TERM GOAL #2   Title  Pt will demonstrate increased ease with fatening buttons as evidenced by decreasing 3 button/ un button test  to 45 secs or less    Baseline  53.03    Time  5    Period  Weeks    Status  New      OT LONG TERM GOAL #3  Title  Pt will verbalzie understanding of adpated strategies for ADLs/IADLs. and ways to prevent future PD related complications.    Time  5    Period  Weeks    Status  New      OT LONG TERM GOAL #4   Title  Pt will demonstrate ability to write a sentence with 90% legibility and no significant decrease in letter size.    Baseline   50% legible sentence level    Time  5    Period  Weeks    Status  New      OT LONG TERM GOAL #5   Title  Pt will report increased ease with fastening his seatbelt.    Time  5    Period  Weeks    Status  New      OT LONG TERM GOAL #6   Title  Pt will increase bilateral shoulder flexion by 5* for increased ease with IADLs.    Baseline  RUE 125, LUE 120    Time  5    Period  Weeks    Status  New            Plan - 03/25/19 1656    Clinical Impression Statement  Pt is a 73 year old male who presents to [facility] with history of Parkinson's disease (last MD note reports possible Parkinsonism/?MSA) with history of falls.  He presents to OPOT with abnormal posture, decreased coordination, decreased balance, decreased functional mobility, bradykinesia which impedes perfromance of ADLsIIADLs. Pt can benefit from skilled occupational therapy to maximize pt's safety and independence with daily activities. Pt is a retired Editor, commissioning. PMH includes arthritis,DVT, A-fib, GERD, sleep apnea    OT Occupational Profile and History  Problem Focused Assessment - Including review of records relating to presenting problem    Occupational performance deficits (Please refer to evaluation for details):  ADL's;IADL's;Social Participation    Body Structure / Function / Physical Skills  ADL;UE functional use;Balance;FMC;Gait;ROM;GMC;Coordination;Decreased knowledge of precautions;Decreased knowledge of use of DME;IADL;Strength;Mobility;Dexterity    Rehab Potential  Good    Clinical Decision Making  Limited treatment options, no task modification necessary    Comorbidities Affecting Occupational Performance:  May have comorbidities impacting occupational performance    Modification or Assistance to Complete Evaluation   No modification of tasks or assist necessary to complete eval    OT Frequency  2x / week    OT Duration  --   5 weeks plus eval   OT Treatment/Interventions  Self-care/ADL  training;Ultrasound;Energy conservation;Aquatic Therapy;DME and/or AE instruction;Patient/family education;Balance training;Passive range of motion;Paraffin;Gait Training;Fluidtherapy;Cryotherapy;Therapist, nutritional;Therapeutic exercise;Moist Heat;Manual Therapy;Therapeutic activities;Cognitive remediation/compensation;Neuromuscular education    Plan  initiate coordination HEP, PWR! moves    Consulted and Agree with Plan of Care  Patient       Patient will benefit from skilled therapeutic intervention in order to improve the following deficits and impairments:   Body Structure / Function / Physical Skills: ADL, UE functional use, Balance, FMC, Gait, ROM, GMC, Coordination, Decreased knowledge of precautions, Decreased knowledge of use of DME, IADL, Strength, Mobility, Dexterity       Visit Diagnosis: Other lack of coordination - Plan: Ot plan of care cert/re-cert  Other abnormalities of gait and mobility - Plan: Ot plan of care cert/re-cert  Unsteadiness on feet - Plan: Ot plan of care cert/re-cert  Other symptoms and signs involving the nervous system - Plan: Ot plan of care cert/re-cert    Problem List Patient Active Problem List  Diagnosis Date Noted  . Parkinson's disease ([place]) 02/03/2018  . Community acquired pneumonia of left lower lobe of lung 12/23/2017  . Sepsis ([place]) 12/23/2017  . Morbid obesity due to excess calories ([place]) 08/23/2016  . OSA on CPAP 08/23/2016  . Dyspnea on exertion 08/22/2016  . Umbilical hernia [id number]  . Persistent atrial fibrillation ([place])   . Encounter for therapeutic drug monitoring 08/12/2013  . Long term current use of anticoagulant 07/30/2010  . COLONIC POLYPS 06/03/2010  . 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 GERD 06/03/2010  . HIATAL HERNIA 06/03/2010  . BENIGN PROSTATIC HYPERTROPHY, WITH OBSTRUCTION 06/03/2010  . PSORIASIS 06/03/2010    Jame Seelig 03/25/2019, 5:11 PM  [facility] [address] [place], [place], [address] Phone: [phone]   Fax:  [phone]  Name: OLUWAFEMI LOURY MRN: [id number] Date of Birth: Jan 01, 1946

## 2019-03-25 NOTE — Therapy (Signed)
[facility] [address] [place], [place], [address] Phone: [phone]   Fax:  [phone]  Physical Therapy Evaluation  Patient Details  Name: Stanley Wright MRN: [id number] Date of Birth: 1945/10/22 Referring Provider (PT): Tat, Wells Guiles   Encounter Date: 03/24/2019  PT End of Session - 03/25/19 1249    Visit Number  1    Number of Visits  17    Date for PT Re-Evaluation  06/23/19    Authorization Type  [employer]-10th visit progress note required    PT Start Time  1316    PT Stop Time  1402    PT Time Calculation (min)  46 min    Activity Tolerance  Patient tolerated treatment well    Behavior During Therapy  [facility] for tasks assessed/performed       Past Medical History:  Diagnosis Date  . Arthritis   . BENIGN PROSTATIC HYPERTROPHY, WITH OBSTRUCTION   . Cancer (HCC)    skin, basal, squamous  . COLONIC POLYPS   . Diverticulitis   . DVT (deep venous thrombosis) ([place]) 2000  . Dysrhythmia    Hx Afib- 2017  . Factor 5 Leiden mutation, heterozygous ([place])   . GERD (gastroesophageal reflux disease)    not on medication  . HIATAL HERNIA   . ITP (idiopathic thrombocytopenic purpura) 2003  . Long term current use of anticoagulant   . Parkinson's disease ([place]) 02/03/2018  . PSORIASIS   . Pulmonary embolus ([place]) 2000  . Shortness of breath dyspnea    at times - Talking alot  . Sleep apnea     Past Surgical History:  Procedure Laterality Date  . CARDIOVERSION N/A 11/22/2015   Procedure: CARDIOVERSION;  Surgeon: Sanda Klein, MD;  Location: [facility];  Service: Cardiovascular;  Laterality: N/A;  . COLONOSCOPY    . HERNIA REPAIR Left    Inguinal- x2 . mesh x1  . INSERTION OF MESH N/A 02/25/2016   Procedure: INSERTION OF MESH;  Surgeon: Rolm Bookbinder, MD;  Location: [facility];  Service: General;  Laterality: N/A;  . LUNG REMOVAL, PARTIAL Right 2004   was fungal and not cancerous  . PROSTATE SURGERY     . UMBILICAL HERNIA REPAIR N/A 02/25/2016   Procedure: LAPAROSCOPIC UMBILICAL HERNIA REPAIR;  Surgeon: Rolm Bookbinder, MD;  Location: [facility];  Service: General;  Laterality: N/A;    There were no vitals filed for this visit.   Subjective Assessment - 03/24/19 1319    Subjective  Feel like my brain and feet are just disconnected, especially during off-times of medications.  The extended release over night, only lasts about 4 hours.  No falls in the past 6 months, but lots of near misses.  Does not use assistive device.    Pertinent History  Parkinson's disease; CHF    Patient Stated Goals  Pt's goal for PT is to get back to increased movement confidence.    Currently in Pain?  No/denies   occasional pain in R foot, walking sometimes        [facility] PT Assessment - 03/24/19 1324      Assessment   Medical Diagnosis  Parkinson's disease    Referring Provider (PT)  Tat, Wells Guiles    Onset Date/Surgical Date  02/24/19    Hand Dominance  Right      Precautions   Precautions  Fall      Balance Screen   Has the patient fallen in the past 6 months  No  Has the patient had a decrease in activity level because of a fear of falling?   No    Is the patient reluctant to leave their home because of a fear of falling?   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    [employer], Doing exercises online ([place])      Observation/Other Assessments   Focus on Therapeutic Outcomes (FOTO)   NA      Posture/Postural Control   Posture/Postural Control  Postural limitations    Postural  Limitations  Rounded Shoulders;Forward head   R head rotation     ROM / Strength   AROM / PROM / Strength  Strength      Strength   Overall Strength  Within functional limits for tasks performed    Overall Strength Comments  Grossly tested at least 4+/5 with MMT, but pt notes weakness getting up off the floor      Transfers   Transfers  Sit to Stand;Stand to Sit    Sit to Stand  5: Supervision;Without upper extremity assist;From chair/3-in-1    Five time sit to stand comments   15.06    Stand to Sit  5: Supervision;Without upper extremity assist;To chair/3-in-1      Ambulation/Gait   Ambulation/Gait  Yes    Ambulation/Gait Assistance  5: Supervision    Ambulation/Gait Assistance Details  Pt with occasional festination of gait, freezing with turns    Ambulation Distance (Feet)  150 Feet    Assistive device  None    Gait Pattern  Step-through pattern;Decreased step length - right;Decreased step length - left;Decreased dorsiflexion - right;Decreased dorsiflexion - left;Festinating;Shuffle;Decreased trunk rotation;Narrow base of support;Poor foot clearance - left;Poor foot clearance - right    Ambulation Surface  Level;Indoor    Gait velocity  10.59 sec = 3.1 ft/sec    Gait Comments  With turns, RLE sticks to floor, with L foot pivoting around to complete turn.      Standardized Balance Assessment   Standardized Balance Assessment  Timed Up and Go Test      Timed Up and Go Test   Normal TUG (seconds)  11.25    Manual TUG (seconds)  11.67    Cognitive TUG (seconds)  13.6    TUG Comments  Scores with 10% difference of  TUG and TUG cognitive indicate difficulty with dual tasking      High Level Balance   High Level Balance Comments  Turn to R and Turn to L >5 seconds each direction.  MiniBESTest:  20/28 (Scores <22/28 indicate increased fall risk.)  See full details in notes        Mini-BESTest: Balance Evaluation Systems Test  2005-2013 [place]. All  rights reserved. ________________________________________________________________________________________Anticipatory_________Subscore__3___/6 1. SIT TO STAND Instruction: "Cross your arms across your chest. Try not to use your hands unless you must.Do not let your legs lean against  the back of the chair when you stand. Please stand up now." X(2) Normal: Comes to stand without use of hands and stabilizes independently. (1) Moderate: Comes to stand WITH use of hands on first attempt. (0) Severe: Unable to stand up from chair without assistance, OR needs several attempts with use of hands. 2. RISE TO TOES Instruction: "Place your feet shoulder width apart. Place your hands on your hips. Try to rise as high as you can onto your toes. I will count out loud to 3 seconds. Try to hold this pose for at least 3 seconds. Look straight ahead. Rise now." (2) Normal: Stable for 3 s with maximum height. (1) Moderate: Heels up, but not full range (smaller than when holding hands), OR noticeable instability for 3 s. X(0) Severe: < 3 s. 3. STAND ON ONE LEG Instruction: "Look straight ahead. Keep your hands on your hips. Lift your leg off of the ground behind you without touching or resting your raised leg upon your other standing leg. Stay standing on one leg as long as you can. Look straight ahead. Lift now." Left: Time in Seconds Trial 1:_____Trial 2:_____ (2) Normal: 20 s. X(1) Moderate: < 20 s. (0) Severe: Unable. Right: Time in Seconds Trial 1:_____Trial 2:_____ (2) Normal: 20 s. X(1) Moderate: < 20 s. (0) Severe: Unable To score each side separately use the trial with the longest time. To calculate the sub-score and total score use the side [left or right] with the lowest numerical score [i.e. the worse side]. ______________________________________________________________________________________Reactive Postural Control___________Subscore:___5__/6 4. COMPENSATORY STEPPING CORRECTION-  FORWARD Instruction: "Stand with your feet shoulder width apart, arms at your sides. Lean forward against my hands beyond your forward limits. When I let go, do whatever is necessary, including taking a step, to avoid a fall." X(2) Normal: Recovers independently with a single, large step (second realignment step is allowed). (1) Moderate: More than one step used to recover equilibrium. (0) Severe: No step, OR would fall if not caught, OR falls spontaneously. 5. COMPENSATORY STEPPING CORRECTION- BACKWARD Instruction: "Stand with your feet shoulder width apart, arms at your sides. Lean backward against my hands beyond your backward limits. When I let go, do whatever is necessary, including taking a step, to avoid a fall." (2) Normal: Recovers independently with a single, large step. X(1) Moderate: More than one step used to recover equilibrium. (0) Severe: No step, OR would fall if not caught, OR falls spontaneously. 6. COMPENSATORY STEPPING CORRECTION- LATERAL Instruction: "Stand with your feet together, arms down at your sides. Lean into my hand beyond your sideways limit. When I let go, do whatever is necessary, including taking a step, to avoid a fall." Left X(2) Normal: Recovers independently with 1 step (crossover or lateral OK). (1) Moderate: Several steps to recover equilibrium. (0) Severe: Falls, or cannot step. Right X(2) Normal: Recovers independently with 1 step (crossover or lateral OK). (1) Moderate: Several steps to recover equilibrium. (0) Severe: Falls, or cannot step. Use the side with the lowest score to calculate sub-score and total score. ____________________________________________________________________________________Sensory Orientation_____________Subscore:______6___/6 7. STANCE (FEET TOGETHER); EYES OPEN, FIRM SURFACE Instruction: "Place your hands on your hips. Place your feet together until almost touching. Look straight ahead. Be as stable and still as  possible, until I say stop." Time in seconds:________ X(2) Normal: 30 s. (1) Moderate: < 30 s. (0) Severe: Unable. 8. STANCE (FEET TOGETHER); EYES CLOSED, FOAM SURFACE Instruction: "Step onto the foam. Place your hands on your hips. Place your feet together until almost  touching. Be as stable and still as possible, until I say stop. I will start timing when you close your eyes." Time in seconds:________ X(2) Normal: 30 s. (1) Moderate: < 30 s. (0) Severe: Unable. 9. INCLINE- EYES CLOSED Instruction: "Step onto the incline ramp. Please stand on the incline ramp with your toes toward the top. Place your feet shoulder width apart and have your arms down at your sides. I will start timing when you close your eyes." Time in seconds:________ X(2) Normal: Stands independently 30 s and aligns with gravity. (1) Moderate: Stands independently <30 s OR aligns with surface. (0) Severe: Unable. _________________________________________________________________________________________Dynamic Gait ______Subscore_______6_/10 10. CHANGE IN GAIT SPEED Instruction: "Begin walking at your normal speed, when I tell you 'fast', walk as fast as you can. When I say 'slow', walk very slowly." X(2) Normal: Significantly changes walking speed without imbalance. (1) Moderate: Unable to change walking speed or signs of imbalance. (0) Severe: Unable to achieve significant change in walking speed AND signs of imbalance. [place] - HORIZONTAL Instruction: "Begin walking at your normal speed, when I say "right", turn your head and look to the right. When I say "left" turn your head and look to the left. Try to keep yourself walking in a straight line." (2) Normal: performs head turns with no change in gait speed and good balance. X(1) Moderate: performs head turns with reduction in gait speed. (0) Severe: performs head turns with imbalance. 12. WALK WITH PIVOT TURNS Instruction: "Begin walking at your  normal speed. When I tell you to 'turn and stop', turn as quickly as you can, face the opposite direction, and stop. After the turn, your feet should be close together." (2) Normal: Turns with feet close FAST (< 3 steps) with good balance. X(1) Moderate: Turns with feet close SLOW (>4 steps) with good balance. (0) Severe: Cannot turn with feet close at any speed without imbalance. 13. STEP OVER OBSTACLES Instruction: "Begin walking at your normal speed. When you get to the box, step over it, not around it and keep walking." (2) Normal: Able to step over box with minimal change of gait speed and with good balance. (1) Moderate: Steps over box but touches box OR displays cautious behavior by slowing gait. (0) Severe: Unable to step over box OR steps around box. 14. TIMED UP & GO WITH DUAL TASK [3 METER WALK] Instruction TUG: "When I say 'Go', stand up from chair, walk at your normal speed across the tape on the floor, turn around, and come back to sit in the chair." Instruction TUG with Dual Task: "Count backwards by threes starting at ___. When I say 'Go', stand up from chair, walk at your normal speed across the tape on the floor, turn around, and come back to sit in the chair. Continue counting backwards the entire time." TUG: ________seconds; Dual Task TUG: ________seconds (2) Normal: No noticeable change in sitting, standing or walking while backward counting when compared to TUG without Dual Task. X(1) Moderate: Dual Task affects either counting OR walking (>10%) when compared to the TUG without Dual Task. (0) Severe: Stops counting while walking OR stops walking while counting. When scoring item 14, if subject's gait speed slows more than 10% between the TUG without and with a Dual Task the score should be decreased by a point. TOTAL SCORE: ______20__/28          Objective measurements completed on examination: See above findings.  PT Education -  03/25/19 1247    Education Details  Objective measures, fall risk, plans to address with PT exercises/education    Person(s) Educated  Patient    Methods  Explanation    Comprehension  Verbalized understanding       PT Short Term Goals - 03/25/19 1302      PT SHORT TERM GOAL #1   Title  Pt will be independent with HEP for improved balance, transfers, gait.  TARGET 04/22/2019    Time  4    Period  Weeks    Status  New    Target Date  04/22/19      PT SHORT TERM GOAL #2   Title  Pt will improve 5x sit<>stand test to less than or equal to 13 seconds for improved transfer efficiency and safety.    Time  4    Period  Weeks    Status  New    Target Date  04/22/19      PT SHORT TERM GOAL #3   Title  The patient will perform 360 degree turn in less than 5 seconds for improved turn efficiency and safety.    Time  4    Period  Weeks    Status  New    Target Date  04/22/19      PT SHORT TERM GOAL #4   Title  Pt will verbalize/demo understanding of tips to reduce festination with turns and gait.    Time  4    Period  Weeks    Status  New    Target Date  04/22/19        PT Long Term Goals - 03/25/19 1306      PT LONG TERM GOAL #1   Title  Pt will verbalize/demonstrate understanding of lifting/carrying objects with bilateral UEs, for improved safety with gait.    Time  8    Period  Weeks    Status  New    Target Date  05/20/19      PT LONG TERM GOAL #2   Title  Pt will improve MiniBESTest score to at least 22/28 for decreased fall risk.    Time  8    Period  Weeks    Status  New    Target Date  05/20/19      PT LONG TERM GOAL #3   Title  Pt will improve TUG/TUG cognitive score to </= 10% difference for improved dual tasking.    Time  8    Period  Weeks    Status  New    Target Date  05/20/19             Plan - 03/25/19 1250    Clinical Impression Statement  Pt is a 73 year old male who presents to OPPT with history of Parkinson's disease (last MD note reports  possible Parkinsonism/?MSA) with history of falls.  He presents to OPPT with abnormal posture, decreased functional strength, decreased balance, festination with turns, decreased timing and coordination with gait.  Pt is at fall risk per MiniBESTest and demonstrates slowed mobility with dual tasking and with turns.  He has slowed gait velocity and 5x sit<>stand measures since last eval, 07/15/2018.  Pt will benefit from skilled PT to address the above stated deficits to decrease fall risk, improved overall functional mobility for improved participation in household and community activities.    Personal Factors and Comorbidities  Comorbidity 3+    Comorbidities  PMH includes arthritis,DVT,  A-fib, GERD, sleep apnea    Examination-Activity Limitations  Stand;Transfers;Locomotion Level    Examination-Participation Restrictions  Community Activity    Stability/Clinical Decision Making  Evolving/Moderate complexity    Clinical Decision Making  Moderate    Rehab Potential  Good    PT Frequency  2x / week    PT Duration  8 weeks   plus eval   PT Treatment/Interventions  ADLs/Self Care Home Management;DME Instruction;Balance training;Therapeutic exercise;Therapeutic activities;Functional mobility training;Gait training;Neuromuscular re-education;Patient/family education    PT Next Visit Plan  Initiate HEP to address balance, posture, turns; work in posterior direction, work on turns, festination, sit<>stand functional strengthening       Patient will benefit from skilled therapeutic intervention in order to improve the following deficits and impairments:  Abnormal gait, Decreased balance, Decreased mobility, Decreased strength, Postural dysfunction, Difficulty walking  Visit Diagnosis: Other abnormalities of gait and mobility  Unsteadiness on feet  Other lack of coordination     Problem List Patient Active Problem List   Diagnosis Date Noted  . Parkinson's disease ([place]) 02/03/2018  . Community  acquired pneumonia of left lower lobe of lung 12/23/2017  . Sepsis ([place]) 12/23/2017  . Morbid obesity due to excess calories ([place]) 08/23/2016  . OSA on CPAP 08/23/2016  . Dyspnea on exertion 08/22/2016  . Umbilical hernia [id number]  . Persistent atrial fibrillation ([place])   . Encounter for therapeutic drug monitoring 08/12/2013  . Long term current use of anticoagulant 07/30/2010  . COLONIC POLYPS 06/03/2010  . 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 GERD 06/03/2010  . HIATAL HERNIA 06/03/2010  . BENIGN PROSTATIC HYPERTROPHY, WITH OBSTRUCTION 06/03/2010  . PSORIASIS 06/03/2010    Leandrea Ackley W. 03/25/2019, 1:09 PM  Frazier Butt., PT  [facility] [address] [place], [place], [address] Phone: [phone]   Fax:  [phone]  Name: HUNG KRAVCHUK MRN: [id number] Date of Birth: 1945-10-11

## 2019-03-25 NOTE — Patient Instructions (Signed)
  5 loud HEY-AH twice each day!  Read scripture out loud

## 2019-03-28 ENCOUNTER — Other Ambulatory Visit: Payer: Self-pay

## 2019-03-28 ENCOUNTER — Encounter: Payer: Self-pay | Admitting: Orthopedic Surgery

## 2019-03-28 ENCOUNTER — Ambulatory Visit (INDEPENDENT_AMBULATORY_CARE_PROVIDER_SITE_OTHER): Payer: PPO | Admitting: Orthopedic Surgery

## 2019-03-28 VITALS — Ht 72.0 in | Wt 266.0 lb

## 2019-03-28 DIAGNOSIS — M19071 Primary osteoarthritis, right ankle and foot: Secondary | ICD-10-CM | POA: Diagnosis not present

## 2019-03-28 DIAGNOSIS — M6701 Short Achilles tendon (acquired), right ankle: Secondary | ICD-10-CM

## 2019-03-28 NOTE — Progress Notes (Signed)
Office Visit Note   Patient: Stanley Wright           Date of Birth: 03-19-1946           MRN: [id number] Visit Date: 03/28/2019              Requested by: Maude Leriche, PA-C [address],  [place] [id number] PCP: Maude Leriche, PA-C  Chief Complaint  Patient presents with  . Right Foot - Pain, New Patient (Initial Visit)      HPI: Patient is a 73 year old gentleman who is seen for initial evaluation for midfoot pain on the right.  Patient states he has had symptoms for several months he denies any specific injuries.  He complains of swelling globally around the foot and ankle.  Patient has had radiographs and is seen today in referral for initial evaluation.  Assessment & Plan: Visit Diagnoses:  1. Primary osteoarthritis, right ankle and foot   2. Achilles tendon contracture, right     Plan: Patient does have an avulsion chip off of the navicular with degenerative changes of the talonavicular joint.  Recommend patient wear a stiff soled hiking shoe frail shoe or work shoe that has a steel shank.  Discussed that with reducing motions of the midfoot his symptoms should resolve.  Patient will also need to work on Achilles stretching.  Discussed that if he fails conservative treatment a fusion of the talonavicular joint is a option.  Follow-Up Instructions: Return if symptoms worsen or fail to improve.   Ortho Exam  Patient is alert, oriented, no adenopathy, well-dressed, normal affect, normal respiratory effort. Examination patient has a palpable dorsalis pedis pulse with ambulation has a mild foot drop bilaterally with Achilles tendon contracture bilaterally.  Patient is currently wearing croc slip on shoes.  Patient has venous swelling in the right lower extremity but no venous ulcers.  He is point tender to palpation over the talonavicular joint.  Review of the radiographs shows an avulsion chip off of arthritic bone spur of the navicular with  degenerative changes across the talonavicular joint.  Imaging: No results found. No images are attached to the encounter.  Labs: Lab Results  Component Value Date   HGBA1C 6.3 11/21/2015   HGBA1C 6.2 06/12/2015   REPTSTATUS 12/24/2017 FINAL 12/23/2017   CULT  12/23/2017    NO GROWTH Performed at [facility], [place] [address] [place], [place] [address]    LABORGA ESCHERICHIA COLI 12/22/2017     Lab Results  Component Value Date   ALBUMIN 4.1 01/17/2019   ALBUMIN 2.9 (L) 12/26/2017   ALBUMIN 3.0 (L) 12/25/2017    Lab Results  Component Value Date   MG 1.9 12/25/2017   No results found for: VD25OH  No results found for: PREALBUMIN CBC EXTENDED Latest Ref Rng & Units 12/26/2017 12/25/2017 12/23/2017  WBC 4.0 - 10.5 K/uL 4.7 4.8 7.3  RBC 4.22 - 5.81 MIL/uL 4.22 4.47 4.04(L)  HGB 13.0 - 17.0 g/dL 13.6 14.3 13.1  HCT 39.0 - 52.0 % 40.8 43.3 40.4  PLT 150 - 400 K/uL 118(L) 107(L) 94(L)  NEUTROABS 1.7 - 7.7 K/uL - - -  LYMPHSABS 0.7 - 4.0 K/uL - - -     Body mass index is 36.08 kg/m.  Orders:  No orders of the defined types were placed in this encounter.  No orders of the defined types were placed in this encounter.    Procedures: No procedures performed  Clinical Data:  No additional findings.  ROS:  All other systems negative, except as noted in the HPI. Review of Systems  Objective: Vital Signs: Ht 6' (1.829 m)   Wt 266 lb (120.7 kg)   BMI 36.08 kg/m   Specialty Comments:  No specialty comments available.  PMFS History: Patient Active Problem List   Diagnosis Date Noted  . Parkinson's disease ([place]) 02/03/2018  . Community acquired pneumonia of left lower lobe of lung 12/23/2017  . Sepsis ([place]) 12/23/2017  . Morbid obesity due to excess calories ([place]) 08/23/2016  . OSA on CPAP 08/23/2016  . Dyspnea on exertion 08/22/2016  . Umbilical hernia [id number]  . Persistent atrial fibrillation ([place])   . Encounter for therapeutic drug monitoring  08/12/2013  . Long term current use of anticoagulant 07/30/2010  . COLONIC POLYPS 06/03/2010  . 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 GERD 06/03/2010  . HIATAL HERNIA 06/03/2010  . BENIGN PROSTATIC HYPERTROPHY, WITH OBSTRUCTION 06/03/2010  . PSORIASIS 06/03/2010   Past Medical History:  Diagnosis Date  . Arthritis   . BENIGN PROSTATIC HYPERTROPHY, WITH OBSTRUCTION   . Cancer (HCC)    skin, basal, squamous  . COLONIC POLYPS   . Diverticulitis   . DVT (deep venous thrombosis) ([place]) 2000  . Dysrhythmia    Hx Afib- 2017  . Factor 5 Leiden mutation, heterozygous ([place])   . GERD (gastroesophageal reflux disease)    not on medication  . HIATAL HERNIA   . ITP (idiopathic thrombocytopenic purpura) 2003  . Long term current use of anticoagulant   . Parkinson's disease ([place]) 02/03/2018  . PSORIASIS   . Pulmonary embolus ([place]) 2000  . Shortness of breath dyspnea    at times - Talking alot  . Sleep apnea     Family History  Problem Relation Age of Onset  . Cancer Father   . Breast cancer Sister   . Heart disease Brother   . Cancer Maternal Grandmother   . Stroke Paternal Grandfather     Past Surgical History:  Procedure Laterality Date  . CARDIOVERSION N/A 11/22/2015   Procedure: CARDIOVERSION;  Surgeon: Sanda Klein, MD;  [facility];  Service: Cardiovascular;  Laterality: N/A;  . COLONOSCOPY    . HERNIA REPAIR Left    Inguinal- x2 . mesh x1  . INSERTION OF MESH N/A 02/25/2016   Procedure: INSERTION OF MESH;  Surgeon: Rolm Bookbinder, MD;  Location: [facility];  Service: General;  Laterality: N/A;  . LUNG REMOVAL, PARTIAL Right 2004   was fungal and not cancerous  . PROSTATE SURGERY    . UMBILICAL HERNIA REPAIR N/A 02/25/2016   Procedure: LAPAROSCOPIC UMBILICAL HERNIA REPAIR;  Surgeon: Rolm Bookbinder, MD;  Location: [facility];  Service: General;  Laterality: N/A;   Social History   Occupational History    Employer: RETIRED    Comment: Engineer, structural  Tobacco Use  .  Smoking status: Former Smoker    Packs/day: 1.00    Quit date: 06/16/1985    Years since quitting: 33.8  . Smokeless tobacco: Never Used  . Tobacco comment: quit approx age 68-   Substance and Sexual Activity  . Alcohol use: No  . Drug use: No  . Sexual activity: Yes

## 2019-03-30 ENCOUNTER — Other Ambulatory Visit: Payer: Self-pay

## 2019-03-30 ENCOUNTER — Ambulatory Visit: Payer: PPO | Admitting: Physical Therapy

## 2019-03-30 ENCOUNTER — Ambulatory Visit: Payer: PPO

## 2019-03-30 ENCOUNTER — Ambulatory Visit: Payer: PPO | Admitting: Occupational Therapy

## 2019-03-30 ENCOUNTER — Encounter: Payer: Self-pay | Admitting: Occupational Therapy

## 2019-03-30 ENCOUNTER — Encounter: Payer: Self-pay | Admitting: Physical Therapy

## 2019-03-30 DIAGNOSIS — R2689 Other abnormalities of gait and mobility: Secondary | ICD-10-CM

## 2019-03-30 DIAGNOSIS — R29818 Other symptoms and signs involving the nervous system: Secondary | ICD-10-CM

## 2019-03-30 DIAGNOSIS — R2681 Unsteadiness on feet: Secondary | ICD-10-CM

## 2019-03-30 DIAGNOSIS — R471 Dysarthria and anarthria: Secondary | ICD-10-CM

## 2019-03-30 DIAGNOSIS — R278 Other lack of coordination: Secondary | ICD-10-CM

## 2019-03-30 NOTE — Therapy (Signed)
[facility] [address] [place], [place], [address] Phone: [phone]   Fax:  [phone]  Speech Language Pathology Treatment  Patient Details  Name: Stanley Wright MRN: [id number] Date of Birth: [birth year] Referring Provider (SLP): Alonza Bogus, DO   Encounter Date: 03/30/2019  End of Session - 03/30/19 1706    Visit Number  2    Number of Visits  17    Date for SLP Re-Evaluation  06/22/19    SLP Start Time  [id number]    SLP Stop Time   1445    SLP Time Calculation (min)  40 min    Activity Tolerance  Patient tolerated treatment well       Past Medical History:  Diagnosis Date  . Arthritis   . BENIGN PROSTATIC HYPERTROPHY, WITH OBSTRUCTION   . Cancer (HCC)    skin, basal, squamous  . COLONIC POLYPS   . Diverticulitis   . DVT (deep venous thrombosis) ([place]) 2000  . Dysrhythmia    Hx Afib- 2017  . Factor 5 Leiden mutation, heterozygous ([place])   . GERD (gastroesophageal reflux disease)    not on medication  . HIATAL HERNIA   . ITP (idiopathic thrombocytopenic purpura) 2003  . Long term current use of anticoagulant   . Parkinson's disease ([place]) 02/03/2018  . PSORIASIS   . Pulmonary embolus ([place]) 2000  . Shortness of breath dyspnea    at times - Talking alot  . Sleep apnea     Past Surgical History:  Procedure Laterality Date  . CARDIOVERSION N/A 11/22/2015   Procedure: CARDIOVERSION;  Surgeon: Sanda Klein, MD;  [facility];  Service: Cardiovascular;  Laterality: N/A;  . COLONOSCOPY    . HERNIA REPAIR Left    Inguinal- x2 . mesh x1  . INSERTION OF MESH N/A 02/25/2016   Procedure: INSERTION OF MESH;  Surgeon: Rolm Bookbinder, MD;  Location: [facility];  Service: General;  Laterality: N/A;  . LUNG REMOVAL, PARTIAL Right 2004   was fungal and not cancerous  . PROSTATE SURGERY    . UMBILICAL HERNIA REPAIR N/A 02/25/2016   Procedure: LAPAROSCOPIC UMBILICAL HERNIA REPAIR;  Surgeon: Rolm Bookbinder,  MD;  Location: [facility];  Service: General;  Laterality: N/A;    There were no vitals filed for this visit.  Subjective Assessment - 03/30/19 1409    Subjective  Pt read scripture on Sunday - he states people said he was understood without a mic.    Currently in Pain?  No/denies            ADULT SLP TREATMENT - 03/30/19 1411      General Information   Behavior/Cognition  Alert;Cooperative;Pleasant mood      Treatment Provided   Treatment provided  Cognitive-Linquistic      Cognitive-Linquistic Treatment   Treatment focused on  Dysarthria    Skilled Treatment  SLP entered room with volume sub-normal level. Pt got out his scripture for Sunday and read it - SLP needed to provide mod-max cues usually for a rate that was functional due to pockets of rapid rate and decr'd intelligibilty. In loud HEY-AHHH pt  averaged upper 80s-low 90s dB with occasional mod A for sustaining loudness and not letting it decay. In structured sentence responses pt maintained adequate volume 90% of the time, with functional rate 85% with occasional min-mod A for rate.      Assessment / Recommendations / Plan   Plan  Continue with current plan of care  Progression Toward Goals   Progression toward goals  Progressing toward goals       SLP Education - 03/30/19 1706    Education Details  "loud and plain" (louder and clearer (slower) speech)    Person(s) Educated  Patient    Methods  Explanation;Demonstration;Verbal cues    Comprehension  Verbalized understanding;Returned demonstration;Verbal cues required;Need further instruction       SLP Short Term Goals - 03/30/19 1707      SLP SHORT TERM GOAL #1   Title  pt will produce loud /a/or "Hey-ah" at average low 90s dB over 4 sessions    Time  4    Period  Weeks    Status  On-going      SLP SHORT TERM GOAL #2   Title  pt will generate 18/20 sentences with WNL average volume over 3 sessions    Time  4    Period  Weeks    Status  On-going       SLP SHORT TERM GOAL #3   Title  pt will use abdominal breathing at rest 85% success over 2 sessions    Time  4    Period  Weeks    Status  On-going      SLP SHORT TERM GOAL #4   Title  In 5 minutes simple conversation pt will achieve average speech volume of low 70s dB over two sessions    Time  4    Period  Weeks    Status  On-going       SLP Long Term Goals - 03/30/19 1707      SLP LONG TERM GOAL #1   Title  Pt will generate loud /a/ with average of low 90s dB over total of 6 sessions    Time  8    Period  Weeks   or 17 sessions, for all LTGs   Status  On-going      SLP LONG TERM GOAL #2   Title  pt will use abdominal breathing 70% of the time in 8 minutes simple-mod complex conversation    Time  8    Period  Weeks    Status  On-going      SLP LONG TERM GOAL #3   Title  In 10 minutes simple-mod complex conversation pt will achieve average speech volume of low 70s dB over four sessions    Time  8    Period  Weeks    Status  On-going       Plan - 03/30/19 1706    Clinical Impression Statement  Pt presents today with 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 Pt talked at suboptimal dB (mid 60s dB average) until SLP did loud "hey-AH" with pt (average upper 80s-low 90s dB). . SLP believes pt will cont to benefit from skilled ST targeting incr'd volume and greater usage of abdomoinal breathing in order to improve communicative effectiveness.    Speech Therapy Frequency  2x / week    Duration  --   8 weeks or 17 sessions   Treatment/Interventions  SLP instruction and feedback;Compensatory strategies;Patient/family education;Functional tasks;Cueing hierarchy;Multimodal communcation approach;Environmental controls;Aspiration precaution training;Pharyngeal strengthening exercises;Diet toleration management by SLP    Potential to Achieve Goals  Good    Consulted and Agree with Plan of Care  Patient        Patient will benefit from skilled therapeutic intervention in order to improve the following deficits  and impairments:   Dysarthria and anarthria    Problem List Patient Active Problem List   Diagnosis Date Noted  . Parkinson's disease ([place]) 02/03/2018  . Community acquired pneumonia of left lower lobe of lung 12/23/2017  . Sepsis ([place]) 12/23/2017  . Morbid obesity due to excess calories ([place]) 08/23/2016  . OSA on CPAP 08/23/2016  . Dyspnea on exertion 08/22/2016  . Umbilical hernia [id number]  . Persistent atrial fibrillation ([place])   . Encounter for therapeutic drug monitoring 08/12/2013  . Long term current use of anticoagulant 07/30/2010  . COLONIC POLYPS 06/03/2010  . 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 GERD 06/03/2010  . HIATAL HERNIA 06/03/2010  . BENIGN PROSTATIC HYPERTROPHY, WITH OBSTRUCTION 06/03/2010  . PSORIASIS 06/03/2010    [place] ,[place], CCC-SLP  03/30/2019, 5:08 PM  [facility] [address] [place], [place], [address] Phone: [phone]   Fax:  [phone]   Name: Stanley Wright MRN: [id number] Date of Birth: 26-Mar-1946

## 2019-03-30 NOTE — Therapy (Signed)
[facility] [address] [place], [place], [address] Phone: [phone]   Fax:  [phone]  Physical Therapy Treatment  Patient Details  Name: Stanley Wright MRN: [id number] Date of Birth: 1945-07-14 Referring Provider (PT): Tat, Wells Guiles   Encounter Date: 03/30/2019  PT End of Session - 03/30/19 1646    Visit Number  2    Number of Visits  17    Date for PT Re-Evaluation  06/23/19    Authorization Type  [employer]-10th visit progress note required    PT Start Time  1446    PT Stop Time  1530    PT Time Calculation (min)  44 min    Equipment Utilized During Treatment  Gait belt    Activity Tolerance  Patient tolerated treatment well    Behavior During Therapy  [facility] for tasks assessed/performed       Past Medical History:  Diagnosis Date  . Arthritis   . BENIGN PROSTATIC HYPERTROPHY, WITH OBSTRUCTION   . Cancer (HCC)    skin, basal, squamous  . COLONIC POLYPS   . Diverticulitis   . DVT (deep venous thrombosis) ([place]) 2000  . Dysrhythmia    Hx Afib- 2017  . Factor 5 Leiden mutation, heterozygous ([facility])   . GERD (gastroesophageal reflux disease)    not on medication  . HIATAL HERNIA   . ITP (idiopathic thrombocytopenic purpura) 2003  . Long term current use of anticoagulant   . Parkinson's disease ([facility]) 02/03/2018  . PSORIASIS   . Pulmonary embolus ([place]) 2000  . Shortness of breath dyspnea    at times - Talking alot  . Sleep apnea     Past Surgical History:  Procedure Laterality Date  . CARDIOVERSION N/A 11/22/2015   Procedure: CARDIOVERSION;  Surgeon: Sanda Klein, MD;  [facility];  Service: Cardiovascular;  Laterality: N/A;  . COLONOSCOPY    . HERNIA REPAIR Left    Inguinal- x2 . mesh x1  . INSERTION OF MESH N/A 02/25/2016   Procedure: INSERTION OF MESH;  Surgeon: Rolm Bookbinder, MD;  Location: [facility];  Service: General;  Laterality: N/A;  . LUNG REMOVAL, PARTIAL Right 2004    was fungal and not cancerous  . PROSTATE SURGERY    . UMBILICAL HERNIA REPAIR N/A 02/25/2016   Procedure: LAPAROSCOPIC UMBILICAL HERNIA REPAIR;  Surgeon: Rolm Bookbinder, MD;  Location: [facility];  Service: General;  Laterality: N/A;    There were no vitals filed for this visit.  Subjective Assessment - 03/30/19 1449    Subjective  The doctor thinks my foot is a bone spur that has broken off.  No restrictions, just pain when I'm standing.  Brought in my exercises from previous sessions.    Pertinent History  Parkinson's disease; CHF    Patient Stated Goals  Pt's goal for PT is to get back to increased movement confidence.    Currently in Pain?  No/denies                       [facility] Adult PT Treatment/Exercise - 03/30/19 1457      Ambulation/Gait   Ambulation/Gait  Yes    Ambulation/Gait Assistance  5: Supervision   min guard with figure 8 turns   Assistive device  None    Gait Pattern  Step-through pattern;Decreased step length - right;Decreased step length - left;Decreased dorsiflexion - right;Decreased dorsiflexion - left;Festinating;Shuffle;Decreased trunk rotation;Narrow base of support;Poor foot clearance - left;Poor foot clearance -  right;Step-to pattern;Decreased arm swing - left    Ambulation Surface  Level;Indoor    Pre-Gait Activities  Practiced PWR! Up position in standing, 2 sets x 3-5 reps, with additional practice as a means to stop an reset with festination episodes.  Then performed PWR! Rock (through hips only) as a means to initiate gait after fesination episodes.  Practiced step and weightshift to the side, x 5 reps, then forward x 5 reps (cues given for increased foot clearance with LLE).      Gait Comments  Practiced turns:  lateral weightshifting to step/shift, then shift and lift with turning, for rocking turns.  Then progressed to sidestep/quarter turns, R and L, 3 reps.        High Level Balance   High Level Balance Activities  Backward  walking;Direction changes    High Level Balance Comments  Forward/back walking at parallel bars, with cues for increased step length in posterior direction; progressed to variable number of steps in forward/back direction.  Then progressed to variable step R, L, back, front (therapist calling out direction and # of steps).          Gait activities, including figure-8's around furniture, including turning to change direction of figure-8, with addition of cognitive tasks and manual tasks (carrying upside down cones pouring paper from one to the other).  Occasional festination episodes with PT providing cues to STOP, PWR! Up, then rock, big step to start.  Progressed to trial of gait with walking pole x 262f t, including turns around furniture, but pt carries instead of using.     PT Education - 03/30/19 1646    Education Details  HEP and turning method    Person(s) Educated  Patient    Methods  Explanation;Demonstration;Handout    Comprehension  Verbalized understanding;Returned demonstration;Verbal cues required       PT Short Term Goals - 03/25/19 1302      PT SHORT TERM GOAL #1   Title  Pt will be independent with HEP for improved balance, transfers, gait.  TARGET 04/22/2019    Time  4    Period  Weeks    Status  New    Target Date  04/22/19      PT SHORT TERM GOAL #2   Title  Pt will improve 5x sit<>stand test to less than or equal to 13 seconds for improved transfer efficiency and safety.    Time  4    Period  Weeks    Status  New    Target Date  04/22/19      PT SHORT TERM GOAL #3   Title  The patient will perform 360 degree turn in less than 5 seconds for improved turn efficiency and safety.    Time  4    Period  Weeks    Status  New    Target Date  04/22/19      PT SHORT TERM GOAL #4   Title  Pt will verbalize/demo understanding of tips to reduce festination with turns and gait.    Time  4    Period  Weeks    Status  New    Target Date  04/22/19        PT Long  Term Goals - 03/25/19 1306      PT LONG TERM GOAL #1   Title  Pt will verbalize/demonstrate understanding of lifting/carrying objects with bilateral UEs, for improved safety with gait.    Time  8    Period  Weeks    Status  New    Target Date  05/20/19      PT LONG TERM GOAL #2   Title  Pt will improve MiniBESTest score to at least 22/28 for decreased fall risk.    Time  8    Period  Weeks    Status  New    Target Date  05/20/19      PT LONG TERM GOAL #3   Title  Pt will improve TUG/TUG cognitive score to </= 10% difference for improved dual tasking.    Time  8    Period  Weeks    Status  New    Target Date  05/20/19            Plan - 03/30/19 1647    Clinical Impression Statement  Focus of today's skilled PT session addressed initiation of gait, turning methods, and ways to reduce festination with gait and turns.  Pt brought in previous given HEP (from previous bout of therapy) and PT instructed pt to focus on PWR! Moves in standing (as means to reduce/recover from festination episodes) as well as turning methods.  With attention to practice, pt is able to perform well; with distractions, pt increases speed of movement patterns significantly, leading to hastening/festination.  Pt will still require practice to address balance and gait for improved mobility.    Personal Factors and Comorbidities  Comorbidity 3+    Comorbidities  PMH includes arthritis,DVT, A-fib, GERD, sleep apnea    Examination-Activity Limitations  Stand;Transfers;Locomotion Level    Examination-Participation Restrictions  Community Activity    Stability/Clinical Decision Making  Evolving/Moderate complexity    Rehab Potential  Good    PT Frequency  2x / week    PT Duration  8 weeks   plus eval   PT Treatment/Interventions  ADLs/Self Care Home Management;DME Instruction;Balance training;Therapeutic exercise;Therapeutic activities;Functional mobility training;Gait training;Neuromuscular  re-education;Patient/family education    PT Next Visit Plan  Review HEP; continue to work on turns, gait, changes of direction; work on posterior direction, sit<>stand functional strengthening; addition of dual tasking    Consulted and Agree with Plan of Care  Patient       Patient will benefit from skilled therapeutic intervention in order to improve the following deficits and impairments:  Abnormal gait, Decreased balance, Decreased mobility, Decreased strength, Postural dysfunction, Difficulty walking  Visit Diagnosis: Other abnormalities of gait and mobility  Unsteadiness on feet     Problem List Patient Active Problem List   Diagnosis Date Noted  . Parkinson's disease ([place]) 02/03/2018  . Community acquired pneumonia of left lower lobe of lung 12/23/2017  . Sepsis ([place]) 12/23/2017  . Morbid obesity due to excess calories ([place] [facility]) 08/23/2016  . OSA on CPAP 08/23/2016  . Dyspnea on exertion 08/22/2016  . Umbilical hernia [id number]  . Persistent atrial fibrillation ([facility])   . Encounter for therapeutic drug monitoring 08/12/2013  . Long term current use of anticoagulant 07/30/2010  . COLONIC POLYPS 06/03/2010  . 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 GERD 06/03/2010  . HIATAL HERNIA 06/03/2010  . BENIGN PROSTATIC HYPERTROPHY, WITH OBSTRUCTION 06/03/2010  . PSORIASIS 06/03/2010    [address] 03/30/2019, 4:52 PM  Frazier Butt., PT   [facility] [address] [place], [place], [address] Phone: [phone]   Fax:  [phone]  Name: SUHAN DUMAS MRN: [id number] Date of Birth: 07-30-1945

## 2019-03-30 NOTE — Therapy (Signed)
[facility] [address] [place], [place], [address] Phone: [phone]   Fax:  [phone]  Occupational Therapy Treatment  Patient Details  Name: Stanley Wright MRN: [id number] Date of Birth: 1946/05/10 Referring Provider (OT): Dr. Carles Collet   Encounter Date: 03/30/2019  OT End of Session - 03/30/19 1324    Visit Number  2    Number of Visits  11    Date for OT Re-Evaluation  05/01/19    Authorization Type  [employer]    Authorization - Visit Number  2    Authorization - Number of Visits  10    OT Start Time  1319    OT Stop Time  1400    OT Time Calculation (min)  41 min    Activity Tolerance  Patient tolerated treatment well    Behavior During Therapy  [facility] for tasks assessed/performed       Past Medical History:  Diagnosis Date  . Arthritis   . BENIGN PROSTATIC HYPERTROPHY, WITH OBSTRUCTION   . Cancer (HCC)    skin, basal, squamous  . COLONIC POLYPS   . Diverticulitis   . DVT (deep venous thrombosis) ([place]) 2000  . Dysrhythmia    Hx Afib- 2017  . Factor 5 Leiden mutation, heterozygous ([place])   . GERD (gastroesophageal reflux disease)    not on medication  . HIATAL HERNIA   . ITP (idiopathic thrombocytopenic purpura) 2003  . Long term current use of anticoagulant   . Parkinson's disease ([place]) 02/03/2018  . PSORIASIS   . Pulmonary embolus ([place]) 2000  . Shortness of breath dyspnea    at times - Talking alot  . Sleep apnea     Past Surgical History:  Procedure Laterality Date  . CARDIOVERSION N/A 11/22/2015   Procedure: CARDIOVERSION;  Surgeon: Sanda Klein, MD;  [facility];  Service: Cardiovascular;  Laterality: N/A;  . COLONOSCOPY    . HERNIA REPAIR Left    Inguinal- x2 . mesh x1  . INSERTION OF MESH N/A 02/25/2016   Procedure: INSERTION OF MESH;  Surgeon: Rolm Bookbinder, MD;  Location: [facility];  Service: General;  Laterality: N/A;  . LUNG REMOVAL, PARTIAL Right 2004   was fungal and  not cancerous  . PROSTATE SURGERY    . UMBILICAL HERNIA REPAIR N/A 02/25/2016   Procedure: LAPAROSCOPIC UMBILICAL HERNIA REPAIR;  Surgeon: Rolm Bookbinder, MD;  Location: [facility];  Service: General;  Laterality: N/A;    There were no vitals filed for this visit.  Subjective Assessment - 03/30/19 1322    Subjective   no pain in sitting, occasional R foot pain    Currently in Pain?  No/denies              OT Education - 03/30/19 1340    Education Details  PWR! hands (basic 4); Coordination HEP with focus on large amplitude movements--see pt instructions.  Education regarding:  how large amplitude, deliberate movements help ADLs and how PD symptoms affect movement in UEs, benefits of PD specific exercises, importance of trunk movements with UE movement for functional tasks.  Instructed pt to turn and reach when grasping or fastening seat belt to incr awareness of movement size, incr ease, and promote associated trunk movements.  (for improved ADLs, UE functional use and decr risk of future complications)   Person(s) Educated  Patient    Methods  Explanation;Demonstration;Verbal cues;Handout    Comprehension  Verbalized understanding;Returned demonstration;Verbal cues required;Need further instruction  OT Long Term Goals - 03/25/19 1659      OT LONG TERM GOAL #1   Title  I with PD specific HEP.    Time  5    Period  Weeks    Status  New    Target Date  05/01/19      OT LONG TERM GOAL #2   Title  Pt will demonstrate increased ease with fatening buttons as evidenced by decreasing 3 button/ un button test  to 45 secs or less    Baseline  53.03    Time  5    Period  Weeks    Status  New      OT LONG TERM GOAL #3   Title  Pt will verbalzie understanding of adpated strategies for ADLs/IADLs. and ways to prevent future PD related complications.    Time  5    Period  Weeks    Status  New      OT LONG TERM GOAL #4   Title  Pt will demonstrate ability to write a  sentence with 90% legibility and no significant decrease in letter size.    Baseline  50% legible sentence level    Time  5    Period  Weeks    Status  New      OT LONG TERM GOAL #5   Title  Pt will report increased ease with fastening his seatbelt.    Time  5    Period  Weeks    Status  New      OT LONG TERM GOAL #6   Title  Pt will increase bilateral shoulder flexion by 5* for increased ease with IADLs.    Baseline  RUE 125, LUE 120    Time  5    Period  Weeks    Status  New            Plan - 03/30/19 1337    Clinical Impression Statement  Pt verbalized understanding of initial HEP, but would benefit from continued cueing for incr movement amplitude and use of strategies for ADLs due to decr awareness of movement patterns.    OT Occupational Profile and History  Problem Focused Assessment - Including review of records relating to presenting problem    Occupational performance deficits (Please refer to evaluation for details):  ADL's;IADL's;Social Participation    Body Structure / Function / Physical Skills  ADL;UE functional use;Balance;FMC;Gait;ROM;GMC;Coordination;Decreased knowledge of precautions;Decreased knowledge of use of DME;IADL;Strength;Mobility;Dexterity    Rehab Potential  Good    Clinical Decision Making  Limited treatment options, no task modification necessary    Comorbidities Affecting Occupational Performance:  May have comorbidities impacting occupational performance    Modification or Assistance to Complete Evaluation   No modification of tasks or assist necessary to complete eval    OT Frequency  2x / week    OT Duration  --   5 weeks plus eval   OT Treatment/Interventions  Self-care/ADL training;Ultrasound;Energy conservation;Aquatic Therapy;DME and/or AE instruction;Patient/family education;Balance training;Passive range of motion;Paraffin;Gait Training;Fluidtherapy;Cryotherapy;Therapist, nutritional;Therapeutic exercise;Moist Heat;Manual  Therapy;Therapeutic activities;Cognitive remediation/compensation;Neuromuscular education    Plan  review coordination HEP/PWR! hands, writing, PWR! moves    Consulted and Agree with Plan of Care  Patient       Patient will benefit from skilled therapeutic intervention in order to improve the following deficits and impairments:   Body Structure / Function / Physical Skills: ADL, UE functional use, Balance, FMC, Gait, ROM, GMC, Coordination, Decreased knowledge of precautions, Decreased  knowledge of use of DME, IADL, Strength, Mobility, Dexterity       Visit Diagnosis: Other symptoms and signs involving the nervous system  Other lack of coordination  Other abnormalities of gait and mobility  Unsteadiness on feet    Problem List Patient Active Problem List   Diagnosis Date Noted  . Parkinson's disease ([place]) 02/03/2018  . Community acquired pneumonia of left lower lobe of lung 12/23/2017  . Sepsis ([place]) 12/23/2017  . Morbid obesity due to excess calories ([place]) 08/23/2016  . OSA on CPAP 08/23/2016  . Dyspnea on exertion 08/22/2016  . Umbilical hernia [id number]  . Persistent atrial fibrillation ([place])   . Encounter for therapeutic drug monitoring 08/12/2013  . Long term current use of anticoagulant 07/30/2010  . COLONIC POLYPS 06/03/2010  . 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 GERD 06/03/2010  . HIATAL HERNIA 06/03/2010  . BENIGN PROSTATIC HYPERTROPHY, WITH OBSTRUCTION 06/03/2010  . PSORIASIS 06/03/2010    [facility] 03/30/2019, 2:39 PM  [facility] [address] [place], [place], [address] Phone: [phone]   Fax:  [phone]  Name: OSMOND CEDOTAL MRN: [id number] Date of Birth: February 20, 1946   Vianne Bulls, OTR/L [facility] [address] [place], [place]  [address] [phone] phone [phone] 03/30/19 2:39 PM

## 2019-03-30 NOTE — Patient Instructions (Signed)
    PWR! Hand Exercises  Then, start with elbows bent and hands closed:  PWR! Hands: Push hands out BIG. Elbows straight, wrists up, fingers open and spread apart BIG.    PWR! Step: Touch index finger to thumb while keeping other fingers straight. Flick fingers out BIG (thumb out/straighten fingers). Repeat with other fingers. (Step your thumb to each finger).   With arms stretched out in front of you (elbows straight), perform the following:   PWR! Rock:  Move wrists up and down [employer]! Twist: Twist palms up and down BIG    ** Make each movement big and deliberate so that you feel the movement.  Perform at least 10 repetitions 1x/day, but perform PWR! Hands throughout the day when you are having trouble using your hands (picking up/manipulating small objects, writing, eating, typing, sewing, buttoning, etc.).      Coordination Exercises  Perform the following exercises for 20 minutes 1 times per day. Perform with both hand(s). Perform using big movements.   Flipping Cards: Place deck of cards on the table. Flip cards over by opening your hand big to grasp and then turn your palm up big, opening hand fully to release.  Deal cards: Hold 1/2 or whole deck in your hand. Use thumb to push card off top of deck with one big push.  Rotate ball with fingertips: Pick up with fingers/thumb and move as much as you can with each turn/movement (clockwise and counter-clockwise).  Toss ball from one hand to the other: Toss big/high.  Deliberately open with toss and deliberately close hand after catch.  Toss ball in the air and catch with the same hand: Toss big/high.  Deliberately open with toss and deliberately close hand after catch.  Rotate 2 golf balls in your hand: Both directions.  Pick up coins and stack one at a time: Pick up with big, intentional movements. Do not drag coin to the edge. (5-10 in a stack)  Pick up 5-10 coins one at a time and hold in palm. Then, move  coins from palm to fingertips one at time and place in coin bank/container.  Practice writing: Slow down, write big, and focus on forming each letter.  Practice typing.

## 2019-03-30 NOTE — Patient Instructions (Signed)
Remember, "Loud and plain"

## 2019-03-30 NOTE — Patient Instructions (Signed)
Pt instructed to perform the following in his previously given HEP (from previous bout of therapy):  1)Standing PWR! Moves:  -PWR! Up  -PWR! [place]! Step side, forward  2)Rocking to turn  3)Quarter turn method-R and L, 90 and 180 degrees, then return to center

## 2019-04-01 ENCOUNTER — Ambulatory Visit: Payer: PPO | Admitting: Physical Therapy

## 2019-04-01 ENCOUNTER — Ambulatory Visit: Payer: PPO

## 2019-04-01 ENCOUNTER — Encounter: Payer: Self-pay | Admitting: Physical Therapy

## 2019-04-01 ENCOUNTER — Ambulatory Visit: Payer: PPO | Admitting: Occupational Therapy

## 2019-04-01 ENCOUNTER — Other Ambulatory Visit: Payer: Self-pay

## 2019-04-01 ENCOUNTER — Encounter: Payer: Self-pay | Admitting: Occupational Therapy

## 2019-04-01 DIAGNOSIS — R471 Dysarthria and anarthria: Secondary | ICD-10-CM

## 2019-04-01 DIAGNOSIS — R2689 Other abnormalities of gait and mobility: Secondary | ICD-10-CM | POA: Diagnosis not present

## 2019-04-01 DIAGNOSIS — R2681 Unsteadiness on feet: Secondary | ICD-10-CM

## 2019-04-01 DIAGNOSIS — R278 Other lack of coordination: Secondary | ICD-10-CM

## 2019-04-01 DIAGNOSIS — R29818 Other symptoms and signs involving the nervous system: Secondary | ICD-10-CM

## 2019-04-01 NOTE — Therapy (Signed)
[facility] [address] [place], [place], [address] Phone: [phone]   Fax:  [phone]  Speech Language Pathology Treatment  Patient Details  Name: Stanley Wright MRN: [id number] Date of Birth: 29-Mar-1946 Referring Provider (SLP): Alonza Bogus, DO   Encounter Date: 04/01/2019  End of Session - 04/01/19 0947    Visit Number  3    Number of Visits  17    Date for SLP Re-Evaluation  06/22/19    SLP Start Time  0850    SLP Stop Time   0930    SLP Time Calculation (min)  40 min    Activity Tolerance  Patient tolerated treatment well       Past Medical History:  Diagnosis Date  . Arthritis   . BENIGN PROSTATIC HYPERTROPHY, WITH OBSTRUCTION   . Cancer (HCC)    skin, basal, squamous  . COLONIC POLYPS   . Diverticulitis   . DVT (deep venous thrombosis) ([place]) 2000  . Dysrhythmia    Hx Afib- 2017  . Factor 5 Leiden mutation, heterozygous ([place])   . GERD (gastroesophageal reflux disease)    not on medication  . HIATAL HERNIA   . ITP (idiopathic thrombocytopenic purpura) 2003  . Long term current use of anticoagulant   . Parkinson's disease ([place]) 02/03/2018  . PSORIASIS   . Pulmonary embolus ([place]) 2000  . Shortness of breath dyspnea    at times - Talking alot  . Sleep apnea     Past Surgical History:  Procedure Laterality Date  . CARDIOVERSION N/A 11/22/2015   Procedure: CARDIOVERSION;  Surgeon: Sanda Klein, MD;  [facility];  Service: Cardiovascular;  Laterality: N/A;  . COLONOSCOPY    . HERNIA REPAIR Left    Inguinal- x2 . mesh x1  . INSERTION OF MESH N/A 02/25/2016   Procedure: INSERTION OF MESH;  Surgeon: Rolm Bookbinder, MD;  Location: [facility];  Service: General;  Laterality: N/A;  . LUNG REMOVAL, PARTIAL Right 2004   was fungal and not cancerous  . PROSTATE SURGERY    . UMBILICAL HERNIA REPAIR N/A 02/25/2016   Procedure: LAPAROSCOPIC UMBILICAL HERNIA REPAIR;  Surgeon: Rolm Bookbinder,  MD;  Location: [facility];  Service: General;  Laterality: N/A;    There were no vitals filed for this visit.  Subjective Assessment - 04/01/19 0928    Subjective  Pt arrives with his latest passage of scripture, wanting to practice it.    Currently in Pain?  No/denies            ADULT SLP TREATMENT - 04/01/19 0929      General Information   Behavior/Cognition  Alert;Cooperative;Pleasant mood      Treatment Provided   Treatment provided  Cognitive-Linquistic      Cognitive-Linquistic Treatment   Treatment focused on  Dysarthria    Skilled Treatment  Loud hey-ah average upper 80s - low 90s dB with min /a/ to focus on loudness instead of breathing and loudness. SLP entered room today with volume sub-normal level. Pt again got out his scripture for Sunday and read it - SLP needed to provide mod cues occasionally for a rate that was functional due to pockets of rapid rate and decr'd intelligibilty. Pt's volume was WNL 75% of the time, often, pt demonstrated loudness decay. Pt's speech not unlke that of someone with PSP.       Assessment / Recommendations / Plan   Plan  Continue with current plan of care  Progression Toward Goals   Progression toward goals  Progressing toward goals         SLP Short Term Goals - 04/01/19 0948      SLP SHORT TERM GOAL #1   Title  pt will produce loud /a/or "Hey-ah" at average low 90s dB over 4 sessions    Time  4    Period  Weeks    Status  On-going      SLP SHORT TERM GOAL #2   Title  pt will generate 18/20 sentences with WNL average volume over 3 sessions    Time  4    Period  Weeks    Status  On-going      SLP SHORT TERM GOAL #3   Title  pt will use abdominal breathing at rest 85% success over 2 sessions    Time  4    Period  Weeks    Status  On-going      SLP SHORT TERM GOAL #4   Title  In 5 minutes simple conversation pt will achieve average speech volume of low 70s dB over two sessions    Time  4    Period  Weeks    Status   On-going       SLP Long Term Goals - 04/01/19 0948      SLP LONG TERM GOAL #1   Title  Pt will generate loud /a/ with average of low 90s dB over total of 6 sessions    Time  8    Period  Weeks   or 17 sessions, for all LTGs   Status  On-going      SLP LONG TERM GOAL #2   Title  pt will use abdominal breathing 70% of the time in 8 minutes simple-mod complex conversation    Time  8    Period  Weeks    Status  On-going      SLP LONG TERM GOAL #3   Title  In 10 minutes simple-mod complex conversation pt will achieve average speech volume of low 70s dB over four sessions    Time  8    Period  Weeks    Status  On-going       Plan - 04/01/19 0948    Clinical Impression Statement  Pt presents today with 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 Pt talked at suboptimal dB (mid 60s dB average) until SLP did loud "hey-AH" with pt (average upper 80s-low 90s dB). . SLP believes pt will cont to benefit from skilled ST targeting incr'd volume and greater usage of abdomoinal breathing in order to improve communicative effectiveness.    Speech Therapy Frequency  2x / week    Duration  --   8 weeks or 17 sessions   Treatment/Interventions  SLP instruction and feedback;Compensatory strategies;Patient/family education;Functional tasks;Cueing hierarchy;Multimodal communcation approach;Environmental controls;Aspiration precaution training;Pharyngeal strengthening exercises;Diet toleration management by SLP    Potential to Achieve Goals  Good    Consulted and Agree with Plan of Care  Patient       Patient will benefit from skilled therapeutic intervention in order to improve the following deficits and impairments:   Dysarthria and anarthria    Problem List Patient Active Problem List   Diagnosis Date Noted  . Parkinson's disease ([place]) 02/03/2018  . Community acquired pneumonia of left lower lobe of lung  12/23/2017  . Sepsis ([place]) 12/23/2017  . Morbid obesity due  to excess calories ([place]) 08/23/2016  . OSA on CPAP 08/23/2016  . Dyspnea on exertion 08/22/2016  . Umbilical hernia [id number]  . Persistent atrial fibrillation ([place])   . Encounter for therapeutic drug monitoring 08/12/2013  . Long term current use of anticoagulant 07/30/2010  . COLONIC POLYPS 06/03/2010  . 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 GERD 06/03/2010  . HIATAL HERNIA 06/03/2010  . BENIGN PROSTATIC HYPERTROPHY, WITH OBSTRUCTION 06/03/2010  . PSORIASIS 06/03/2010    [facility], [place]  04/01/2019, 9:48 AM  [facility] [address] [place], [place], [address] Phone: [phone]   Fax:  [phone]   Name: Stanley Wright MRN: [id number] Date of Birth: Apr 24, 1946

## 2019-04-01 NOTE — Therapy (Signed)
[facility] [address] [place], [place], [address] Phone: [phone]   Fax:  [phone]  Occupational Therapy Treatment  Patient Details  Name: Stanley Wright MRN: [id number] Date of Birth: 1945/10/29 Referring Provider (OT): Dr. Carles Collet   Encounter Date: 04/01/2019  OT End of Session - 04/01/19 1113    Visit Number  3    Number of Visits  11    Date for OT Re-Evaluation  05/01/19    Authorization Type  [employer]    Authorization - Visit Number  3    Authorization - Number of Visits  10    OT Start Time  1022    OT Stop Time  1100    OT Time Calculation (min)  38 min    Activity Tolerance  Patient tolerated treatment well    Behavior During Therapy  [facility] for tasks assessed/performed       Past Medical History:  Diagnosis Date  . Arthritis   . BENIGN PROSTATIC HYPERTROPHY, WITH OBSTRUCTION   . Cancer (HCC)    skin, basal, squamous  . COLONIC POLYPS   . Diverticulitis   . DVT (deep venous thrombosis) ([place]) 2000  . Dysrhythmia    Hx Afib- 2017  . Factor 5 Leiden mutation, heterozygous ([place])   . GERD (gastroesophageal reflux disease)    not on medication  . HIATAL HERNIA   . ITP (idiopathic thrombocytopenic purpura) 2003  . Long term current use of anticoagulant   . Parkinson's disease ([place]) 02/03/2018  . PSORIASIS   . Pulmonary embolus ([place]) 2000  . Shortness of breath dyspnea    at times - Talking alot  . Sleep apnea     Past Surgical History:  Procedure Laterality Date  . CARDIOVERSION N/A 11/22/2015   Procedure: CARDIOVERSION;  Surgeon: Sanda Klein, MD;  [facility];  Service: Cardiovascular;  Laterality: N/A;  . COLONOSCOPY    . HERNIA REPAIR Left    Inguinal- x2 . mesh x1  . INSERTION OF MESH N/A 02/25/2016   Procedure: INSERTION OF MESH;  Surgeon: Rolm Bookbinder, MD;  Location: [facility];  Service: General;  Laterality: N/A;  . LUNG REMOVAL, PARTIAL Right 2004   was fungal and  not cancerous  . PROSTATE SURGERY    . UMBILICAL HERNIA REPAIR N/A 02/25/2016   Procedure: LAPAROSCOPIC UMBILICAL HERNIA REPAIR;  Surgeon: Rolm Bookbinder, MD;  Location: [facility];  Service: General;  Laterality: N/A;    There were no vitals filed for this visit.  Subjective Assessment - 04/01/19 1039    Currently in Pain?  No/denies            Treatment: Arm bike x 5 mins level 1 for conditioning, pt maintained 35-40 rpm Handwriting activity using foam grip on pen mod v.c  To slow down and for larger letter size.               OT Education - 04/01/19 1043    Education Details  reviewed PWR! hands (basic 4); Coordination HEP with focus on large amplitude movements-- instructed pt in PWR! moves basic 4 seated 10-20 reps    Person(s) Educated  Patient    Methods  Explanation;Demonstration;Verbal cues;Handout    Comprehension  Verbalized understanding;Returned demonstration;Verbal cues required;Need further instruction          OT Long Term Goals - 03/25/19 1659      OT LONG TERM GOAL #1   Title  I with PD specific HEP.  Time  5    Period  Weeks    Status  New    Target Date  05/01/19      OT LONG TERM GOAL #2   Title  Pt will demonstrate increased ease with fatening buttons as evidenced by decreasing 3 button/ un button test  to 45 secs or less    Baseline  53.03    Time  5    Period  Weeks    Status  New      OT LONG TERM GOAL #3   Title  Pt will verbalzie understanding of adpated strategies for ADLs/IADLs. and ways to prevent future PD related complications.    Time  5    Period  Weeks    Status  New      OT LONG TERM GOAL #4   Title  Pt will demonstrate ability to write a sentence with 90% legibility and no significant decrease in letter size.    Baseline  50% legible sentence level    Time  5    Period  Weeks    Status  New      OT LONG TERM GOAL #5   Title  Pt will report increased ease with fastening his seatbelt.    Time  5    Period   Weeks    Status  New      OT LONG TERM GOAL #6   Title  Pt will increase bilateral shoulder flexion by 5* for increased ease with IADLs.    Baseline  RUE 125, LUE 120    Time  5    Period  Weeks    Status  New            Plan - 04/01/19 1113    Clinical Impression Statement  Pt is progressing towards goals, however he requires verbal cues to slow down during functional activity.    OT Occupational Profile and History  Problem Focused Assessment - Including review of records relating to presenting problem    Occupational performance deficits (Please refer to evaluation for details):  ADL's;IADL's;Social Participation    Body Structure / Function / Physical Skills  ADL;UE functional use;Balance;FMC;Gait;ROM;GMC;Coordination;Decreased knowledge of precautions;Decreased knowledge of use of DME;IADL;Strength;Mobility;Dexterity    Rehab Potential  Good    Clinical Decision Making  Limited treatment options, no task modification necessary    Comorbidities Affecting Occupational Performance:  May have comorbidities impacting occupational performance    Modification or Assistance to Complete Evaluation   No modification of tasks or assist necessary to complete eval    OT Frequency  2x / week    OT Duration  --   5 weeks plus eval   OT Treatment/Interventions  Self-care/ADL training;Ultrasound;Energy conservation;Aquatic Therapy;DME and/or AE instruction;Patient/family education;Balance training;Passive range of motion;Paraffin;Gait Training;Fluidtherapy;Cryotherapy;Therapist, nutritional;Therapeutic exercise;Moist Heat;Manual Therapy;Therapeutic activities;Cognitive remediation/compensation;Neuromuscular education    Plan  supine PWR! moves, continue to address big movements with functional activity    Consulted and Agree with Plan of Care  Patient       Patient will benefit from skilled therapeutic intervention in order to improve the following deficits and impairments:   Body  Structure / Function / Physical Skills: ADL, UE functional use, Balance, FMC, Gait, ROM, GMC, Coordination, Decreased knowledge of precautions, Decreased knowledge of use of DME, IADL, Strength, Mobility, Dexterity       Visit Diagnosis: Other symptoms and signs involving the nervous system  Other lack of coordination    Problem List Patient Active Problem  List   Diagnosis Date Noted  . Parkinson's disease ([place]) 02/03/2018  . Community acquired pneumonia of left lower lobe of lung 12/23/2017  . Sepsis ([place]) 12/23/2017  . Morbid obesity due to excess calories ([place]) 08/23/2016  . OSA on CPAP 08/23/2016  . Dyspnea on exertion 08/22/2016  . Umbilical hernia [id number]  . Persistent atrial fibrillation ([place])   . Encounter for therapeutic drug monitoring 08/12/2013  . Long term current use of anticoagulant 07/30/2010  . COLONIC POLYPS 06/03/2010  . 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 GERD 06/03/2010  . HIATAL HERNIA 06/03/2010  . BENIGN PROSTATIC HYPERTROPHY, WITH OBSTRUCTION 06/03/2010  . PSORIASIS 06/03/2010    RINE,KATHRYN 04/01/2019, 11:15 AM  [facility] [address] [place], [place], [address] Phone: [phone]   Fax:  [phone]  Name: Stanley Wright MRN: [id number] Date of Birth: 09-12-1945

## 2019-04-02 NOTE — Therapy (Signed)
[facility] [address] [place], [place], [address] Phone: [phone]   Fax:  [phone]  Physical Therapy Treatment  Patient Details  Name: Stanley Wright MRN: [id number] Date of Birth: 1946/05/07 Referring Provider (PT): Tat, Wells Guiles   Encounter Date: 04/01/2019  PT End of Session - 04/02/19 1725    Visit Number  3    Number of Visits  17    Date for PT Re-Evaluation  06/23/19    Authorization Type  [employer]-10th visit progress note required    PT Start Time  0933    PT Stop Time  1016    PT Time Calculation (min)  43 min    Activity Tolerance  Patient tolerated treatment well    Behavior During Therapy  [facility] for tasks assessed/performed       Past Medical History:  Diagnosis Date  . Arthritis   . BENIGN PROSTATIC HYPERTROPHY, WITH OBSTRUCTION   . Cancer (HCC)    skin, basal, squamous  . COLONIC POLYPS   . Diverticulitis   . DVT (deep venous thrombosis) ([place]) 2000  . Dysrhythmia    Hx Afib- 2017  . Factor 5 Leiden mutation, heterozygous ([place])   . GERD (gastroesophageal reflux disease)    not on medication  . HIATAL HERNIA   . ITP (idiopathic thrombocytopenic purpura) 2003  . Long term current use of anticoagulant   . Parkinson's disease ([place]) 02/03/2018  . PSORIASIS   . Pulmonary embolus ([place]) 2000  . Shortness of breath dyspnea    at times - Talking alot  . Sleep apnea     Past Surgical History:  Procedure Laterality Date  . CARDIOVERSION N/A 11/22/2015   Procedure: CARDIOVERSION;  Surgeon: Sanda Klein, MD;  [facility];  Service: Cardiovascular;  Laterality: N/A;  . COLONOSCOPY    . HERNIA REPAIR Left    Inguinal- x2 . mesh x1  . INSERTION OF MESH N/A 02/25/2016   Procedure: INSERTION OF MESH;  Surgeon: Rolm Bookbinder, MD;  Location: [facility];  Service: General;  Laterality: N/A;  . LUNG REMOVAL, PARTIAL Right 2004   was fungal and not cancerous  . PROSTATE SURGERY     . UMBILICAL HERNIA REPAIR N/A 02/25/2016   Procedure: LAPAROSCOPIC UMBILICAL HERNIA REPAIR;  Surgeon: Rolm Bookbinder, MD;  Location: [facility];  Service: General;  Laterality: N/A;    There were no vitals filed for this visit.  Subjective Assessment - 04/01/19 0934    Subjective  No changes since last visit.    Pertinent History  Parkinson's disease; CHF    Patient Stated Goals  Pt's goal for PT is to get back to increased movement confidence.    Currently in Pain?  No/denies                       [facility] Adult PT Treatment/Exercise - 04/02/19 0001      Ambulation/Gait   Ambulation/Gait  Yes    Ambulation/Gait Assistance  5: Supervision    Assistive device  None    Gait Pattern  Step-through pattern;Decreased step length - right;Decreased step length - left;Decreased dorsiflexion - right;Decreased dorsiflexion - left;Festinating;Shuffle;Decreased trunk rotation;Narrow base of support;Poor foot clearance - left;Poor foot clearance - right;Step-to pattern;Decreased arm swing - left    Ambulation Surface  Level;Indoor    Pre-Gait Activities  Reviewed PWR! UP position in standing x 5 reps, PWR! Rock in standing x 5 reps, PWR! step (side and fwd) x  5 reps each, as means to reset position and re-initiate gait after festinating episodes.  Reviewed turning techniques, including sidestep turn (90 and 180 degrees) and rocking turn.  Pt better able to perform sidestep turn.    Gait Comments  Gait while carrying weighted ball and larger/lighter ball, with changes of speed, changes of direction (including challenging patient by him holding balls where he can't see his feet-when he does this, he responds well to cues to look ahead at visual target).  Practiced negotiating through doorways with and without carrying objects, with cues to look through the doorrway, at visual target, progressing to obstacles in his way, simulating wall near doorway at home. Pt with no overt significant LOB, able  to lessen festination with doorways with looking ahead at visual target.          Balance Exercises - 04/01/19 0952      Balance Exercises: Standing   Stepping Strategy  Anterior;Posterior;Lateral;UE support;10 reps   Anterior/lateral over obstacle to incr. step height, length   Retro Gait  Upper extremity support;5 reps   Fwd/back at counter, cues-slowed pace, incr. step length   Sit to Stand Time  Sit to stand x 5 reps from chair, cues for slowed pace to squat to sit; sit to stand from 19" mat x 8 reps, with ball to lift; then x 5 reps with weighted ball for improved lift tall to stand        PT Education - 04/02/19 1724    Education Details  doorway negotiation, carrying items with gait, looking ahead at visual target    Person(s) Educated  Patient    Methods  Explanation;Demonstration    Comprehension  Verbalized understanding;Returned demonstration;Verbal cues required;Need further instruction       PT Short Term Goals - 03/25/19 1302      PT SHORT TERM GOAL #1   Title  Pt will be independent with HEP for improved balance, transfers, gait.  TARGET 04/22/2019    Time  4    Period  Weeks    Status  New    Target Date  04/22/19      PT SHORT TERM GOAL #2   Title  Pt will improve 5x sit<>stand test to less than or equal to 13 seconds for improved transfer efficiency and safety.    Time  4    Period  Weeks    Status  New    Target Date  04/22/19      PT SHORT TERM GOAL #3   Title  The patient will perform 360 degree turn in less than 5 seconds for improved turn efficiency and safety.    Time  4    Period  Weeks    Status  New    Target Date  04/22/19      PT SHORT TERM GOAL #4   Title  Pt will verbalize/demo understanding of tips to reduce festination with turns and gait.    Time  4    Period  Weeks    Status  New    Target Date  04/22/19        PT Long Term Goals - 03/25/19 1306      PT LONG TERM GOAL #1   Title  Pt will verbalize/demonstrate  understanding of lifting/carrying objects with bilateral UEs, for improved safety with gait.    Time  8    Period  Weeks    Status  New    Target Date  05/20/19  PT LONG TERM GOAL #2   Title  Pt will improve MiniBESTest score to at least 22/28 for decreased fall risk.    Time  8    Period  Weeks    Status  New    Target Date  05/20/19      PT LONG TERM GOAL #3   Title  Pt will improve TUG/TUG cognitive score to </= 10% difference for improved dual tasking.    Time  8    Period  Weeks    Status  New    Target Date  05/20/19            Plan - 04/02/19 1725    Clinical Impression Statement  NMR activities to address slowed pace of weightshifting, initiation of gait, and slowed/optimal technique of transfers.  Progressed difficulty with gait tasks, with pt carrying items and negoitating doorways (still with festination, but less episodes), while looking ahead at visual target versus looking at his feet.  He will continue to benefit from skilled PT to further address balance and gait deficits for decreased fall riska nd improved mobility.    Personal Factors and Comorbidities  Comorbidity 3+    Comorbidities  PMH includes arthritis,DVT, A-fib, GERD, sleep apnea    Examination-Activity Limitations  Stand;Transfers;Locomotion Level    Examination-Participation Restrictions  Community Activity    Stability/Clinical Decision Making  Evolving/Moderate complexity    Rehab Potential  Good    PT Frequency  2x / week    PT Duration  8 weeks   plus eval   PT Treatment/Interventions  ADLs/Self Care Home Management;DME Instruction;Balance training;Therapeutic exercise;Therapeutic activities;Functional mobility training;Gait training;Neuromuscular re-education;Patient/family education    PT Next Visit Plan  Continue to work on turns, gait, changes of direction, posterior direction, sit<>stand functional strengthening, addition of dual tasking; doorway negoitation (how did it go at home to  look ahead at targets?)    Consulted and Agree with Plan of Care  Patient       Patient will benefit from skilled therapeutic intervention in order to improve the following deficits and impairments:  Abnormal gait, Decreased balance, Decreased mobility, Decreased strength, Postural dysfunction, Difficulty walking  Visit Diagnosis: Unsteadiness on feet  Other abnormalities of gait and mobility     Problem List Patient Active Problem List   Diagnosis Date Noted  . Parkinson's disease ([place]) 02/03/2018  . Community acquired pneumonia of left lower lobe of lung 12/23/2017  . Sepsis ([place]) 12/23/2017  . Morbid obesity due to excess calories ([place]) 08/23/2016  . OSA on CPAP 08/23/2016  . Dyspnea on exertion 08/22/2016  . Umbilical hernia [id number]  . Persistent atrial fibrillation ([place])   . Encounter for therapeutic drug monitoring 08/12/2013  . Long term current use of anticoagulant 07/30/2010  . COLONIC POLYPS 06/03/2010  . 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 GERD 06/03/2010  . HIATAL HERNIA 06/03/2010  . BENIGN PROSTATIC HYPERTROPHY, WITH OBSTRUCTION 06/03/2010  . PSORIASIS 06/03/2010    Stanley Retz W. 04/02/2019, 5:29 PM  Mady Haagensen, PT 04/02/19 5:29 PM Phone: [phone] Fax: [facility] [address] [place], [place], [address] Phone: [phone]   Fax:  [phone]  Name: Stanley Wright MRN: [id number] Date of Birth: 1946/03/30

## 2019-04-02 NOTE — Patient Instructions (Signed)
Tips to Look Ahead at visual target while: -carrying objects in 1-2 hands -negoitating doorways

## 2019-04-05 ENCOUNTER — Ambulatory Visit: Payer: PPO | Admitting: Occupational Therapy

## 2019-04-05 ENCOUNTER — Ambulatory Visit: Payer: PPO

## 2019-04-05 ENCOUNTER — Other Ambulatory Visit: Payer: Self-pay

## 2019-04-05 ENCOUNTER — Ambulatory Visit: Payer: PPO | Admitting: Physical Therapy

## 2019-04-05 DIAGNOSIS — R2689 Other abnormalities of gait and mobility: Secondary | ICD-10-CM | POA: Diagnosis not present

## 2019-04-05 DIAGNOSIS — R471 Dysarthria and anarthria: Secondary | ICD-10-CM

## 2019-04-05 DIAGNOSIS — R2681 Unsteadiness on feet: Secondary | ICD-10-CM

## 2019-04-05 NOTE — Therapy (Signed)
[facility] [address] [place], [place], [address] Phone: [phone]   Fax:  [phone]  Speech Language Pathology Treatment  Patient Details  Name: Stanley Wright MRN: [id number] Date of Birth: 1945/07/07 Referring Provider (SLP): Alonza Bogus, DO   Encounter Date: 04/05/2019  End of Session - 04/05/19 1320    Visit Number  4    Number of Visits  17    Date for SLP Re-Evaluation  06/22/19    SLP Start Time  55    SLP Stop Time   1100    SLP Time Calculation (min)  40 min    Activity Tolerance  Patient tolerated treatment well       Past Medical History:  Diagnosis Date  . Arthritis   . BENIGN PROSTATIC HYPERTROPHY, WITH OBSTRUCTION   . Cancer (HCC)    skin, basal, squamous  . COLONIC POLYPS   . Diverticulitis   . DVT (deep venous thrombosis) ([place]) 2000  . Dysrhythmia    Hx Afib- 2017  . Factor 5 Leiden mutation, heterozygous ([place])   . GERD (gastroesophageal reflux disease)    not on medication  . HIATAL HERNIA   . ITP (idiopathic thrombocytopenic purpura) 2003  . Long term current use of anticoagulant   . Parkinson's disease ([place]) 02/03/2018  . PSORIASIS   . Pulmonary embolus ([place]) 2000  . Shortness of breath dyspnea    at times - Talking alot  . Sleep apnea     Past Surgical History:  Procedure Laterality Date  . CARDIOVERSION N/A 11/22/2015   Procedure: CARDIOVERSION;  Surgeon: Sanda Klein, MD;  [facility];  Service: Cardiovascular;  Laterality: N/A;  . COLONOSCOPY    . HERNIA REPAIR Left    Inguinal- x2 . mesh x1  . INSERTION OF MESH N/A 02/25/2016   Procedure: INSERTION OF MESH;  Surgeon: Rolm Bookbinder, MD;  Location: [facility];  Service: General;  Laterality: N/A;  . LUNG REMOVAL, PARTIAL Right 2004   was fungal and not cancerous  . PROSTATE SURGERY    . UMBILICAL HERNIA REPAIR N/A 02/25/2016   Procedure: LAPAROSCOPIC UMBILICAL HERNIA REPAIR;  Surgeon: Rolm Bookbinder,  MD;  Location: [facility];  Service: General;  Laterality: N/A;    There were no vitals filed for this visit.  Subjective Assessment - 04/05/19 1023    Subjective  "read scripture and didn't use a mic. It went well."    Currently in Pain?  No/denies            ADULT SLP TREATMENT - 04/05/19 1024      General Information   Behavior/Cognition  Alert;Cooperative;Pleasant mood      Treatment Provided   Treatment provided  Cognitive-Linquistic      Cognitive-Linquistic Treatment   Treatment focused on  Dysarthria    Skilled Treatment  Loud "hey - ah" at upper 80s lower 90s dB average. Pt with mild strained strangled quality with /a/, SLP worked with pt to have more relaxed voice however this can a symptom in [place] who are dx'ed Parkinsonism. SLP ascertained how phone conversations go, as pt lives alone with limited speaking while at home. Pt states most phone conversations are successful but rarely he is asked to repeat, as he "mumbles" or is at suboptimal volume level. 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 SLP encoraged pt to do this in cvonersations and certainly to do this as he practices reading his scripture for Sunday morning reading.      Assessment / Recommendations / Plan   Plan  Continue with current plan of care      Progression Toward Goals   Progression toward goals  Progressing toward goals       SLP Education - 04/05/19 1319    Education Details  each word separated (pause between words) is beneficial to incr intelligibilty    Person(s) Educated  Patient    Methods   Explanation;Demonstration    Comprehension  Verbalized understanding;Returned demonstration;Verbal cues required;Need further instruction       SLP Short Term Goals - 04/05/19 1322      SLP SHORT TERM GOAL #1   Title  pt will produce loud /a/or "Hey-ah" at average low 90s dB over 4 sessions    Time  3    Period  Weeks    Status  On-going      SLP SHORT TERM GOAL #2   Title  pt will generate 18/20 sentences with WNL average volume over 3 sessions    Time  3    Period  Weeks    Status  On-going      SLP SHORT TERM GOAL #3   Title  pt will use abdominal breathing at rest 85% success over 2 sessions    Time  3    Period  Weeks    Status  On-going      SLP SHORT TERM GOAL #4   Title  In 5 minutes simple conversation pt will achieve average speech volume of low 70s dB over two sessions    Time  3    Period  Weeks    Status  On-going       SLP Long Term Goals - 04/05/19 1323      SLP LONG TERM GOAL #1   Title  Pt will generate loud /a/ with average of low 90s dB over total of 6 sessions    Time  7    Period  Weeks   or 17 sessions, for all LTGs   Status  On-going      SLP LONG TERM GOAL #2   Title  pt will use abdominal breathing 70% of the time in 8 minutes simple-mod complex conversation    Time  7    Period  Weeks    Status  On-going      SLP LONG TERM GOAL #3   Title  In 10 minutes simple-mod complex conversation pt will achieve average speech volume of low 70s dB over four sessions    Time  7    Period  Weeks    Status  On-going       Plan - 04/05/19 1320    Clinical Impression Statement  Pt presents today with 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 Pt with more success today in slowing rate and limiting rushing of speech in reading task by having pt focus on separating every word by itself as he reads (pausing between words)  Pt appears to have little  concern about how he is  understood in conversations and more concerned about how successful he is at reading scripture an dteaching. SLP believes pt will cont to benefit from skilled ST targeting incr'd volume and greater usage of abdomoinal breathing in order to improve communicative effectiveness.    Speech Therapy Frequency  2x / week    Duration  --   8 weeks or 17 sessions   Treatment/Interventions  SLP instruction and feedback;Compensatory strategies;Patient/family education;Functional tasks;Cueing hierarchy;Multimodal communcation approach;Environmental controls;Aspiration precaution training;Pharyngeal strengthening exercises;Diet toleration management by SLP    Potential to Achieve Goals  Good    Consulted and Agree with Plan of Care  Patient       Patient will benefit from skilled therapeutic intervention in order to improve the following deficits and impairments:   Dysarthria and anarthria    Problem List Patient Active Problem List   Diagnosis Date Noted  . Parkinson's disease ([place]) 02/03/2018  . Community acquired pneumonia of left lower lobe of lung 12/23/2017  . Sepsis ([place]) 12/23/2017  . Morbid obesity due to excess calories ([place]) 08/23/2016  . OSA on CPAP 08/23/2016  . Dyspnea on exertion 08/22/2016  . Umbilical hernia [id number]  . Persistent atrial fibrillation ([place])   . Encounter for therapeutic drug monitoring 08/12/2013  . Long term current use of anticoagulant 07/30/2010  . COLONIC POLYPS 06/03/2010  . 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 GERD 06/03/2010  . HIATAL HERNIA 06/03/2010  . BENIGN PROSTATIC HYPERTROPHY, WITH OBSTRUCTION 06/03/2010  . PSORIASIS 06/03/2010    [facility] ,MS, CCC-SLP  04/05/2019, 1:23 PM  [facility] [address] [place], [place], [address] Phone: [phone]   Fax:  [phone]   Name: DORSE COUVERTIER MRN: [id number] Date of Birth: [birth year]

## 2019-04-06 NOTE — Patient Instructions (Signed)
Back step and rock, then forward step BIG to start walking again (can use with turns or regular gait).

## 2019-04-06 NOTE — Therapy (Signed)
[facility] [address] [place], [place], [address] Phone: [phone]   Fax:  [phone]  Physical Therapy Treatment  Patient Details  Name: Stanley Wright MRN: [id number] Date of Birth: 1946/05/25 Referring Provider (PT): Tat, Wells Guiles   Encounter Date: 04/05/2019  PT End of Session - 04/06/19 1325    Visit Number  4    Number of Visits  17    Date for PT Re-Evaluation  06/23/19    Authorization Type  [employer]-10th visit progress note required    PT Start Time  0849    PT Stop Time  0928    PT Time Calculation (min)  39 min    Activity Tolerance  Patient tolerated treatment well    Behavior During Therapy  [facility] for tasks assessed/performed       Past Medical History:  Diagnosis Date  . Arthritis   . BENIGN PROSTATIC HYPERTROPHY, WITH OBSTRUCTION   . Cancer (HCC)    skin, basal, squamous  . COLONIC POLYPS   . Diverticulitis   . DVT (deep venous thrombosis) ([place]) 2000  . Dysrhythmia    Hx Afib- 2017  . Factor 5 Leiden mutation, heterozygous ([place])   . GERD (gastroesophageal reflux disease)    not on medication  . HIATAL HERNIA   . ITP (idiopathic thrombocytopenic purpura) 2003  . Long term current use of anticoagulant   . Parkinson's disease ([place]) 02/03/2018  . PSORIASIS   . Pulmonary embolus ([place]) 2000  . Shortness of breath dyspnea    at times - Talking alot  . Sleep apnea     Past Surgical History:  Procedure Laterality Date  . CARDIOVERSION N/A 11/22/2015   Procedure: CARDIOVERSION;  Surgeon: Sanda Klein, MD;  [facility];  Service: Cardiovascular;  Laterality: N/A;  . COLONOSCOPY    . HERNIA REPAIR Left    Inguinal- x2 . mesh x1  . INSERTION OF MESH N/A 02/25/2016   Procedure: INSERTION OF MESH;  Surgeon: Rolm Bookbinder, MD;  Location: [facility];  Service: General;  Laterality: N/A;  . LUNG REMOVAL, PARTIAL Right 2004   was fungal and not cancerous  . PROSTATE SURGERY     . UMBILICAL HERNIA REPAIR N/A 02/25/2016   Procedure: LAPAROSCOPIC UMBILICAL HERNIA REPAIR;  Surgeon: Rolm Bookbinder, MD;  Location: [facility];  Service: General;  Laterality: N/A;    There were no vitals filed for this visit.  Subjective Assessment - 04/05/19 0851    Subjective  Had an episode of walking normal, like I used to, on Sunday around the house.  It was good-wish it would happen more often.    Pertinent History  Parkinson's disease; CHF    Patient Stated Goals  Pt's goal for PT is to get back to increased movement confidence.    Currently in Pain?  No/denies                       [facility] Adult PT Treatment/Exercise - 04/06/19 0001      Transfers   Transfers  Sit to Stand;Stand to Sit    Sit to Stand  5: Supervision;Without upper extremity assist;From chair/3-in-1    Stand to Sit  5: Supervision;Without upper extremity assist;To chair/3-in-1    Number of Reps  Other reps (comment)   At least 5 reps throughout session     Ambulation/Gait   Ambulation/Gait  Yes    Ambulation/Gait Assistance  5: Supervision    Assistive device  None    Gait Pattern  Step-through pattern;Decreased step length - right;Decreased step length - left;Decreased dorsiflexion - right;Decreased dorsiflexion - left;Festinating;Shuffle;Decreased trunk rotation;Narrow base of support;Poor foot clearance - left;Poor foot clearance - right;Step-to pattern;Decreased arm swing - left    Ambulation Surface  Level;Indoor    Gait Comments  Gait activities, incorporating 50-100 ft distances in gym with turns and changes of direction, carrying item in one hand, then progressing to carrying cone and weighted ball.  Supervision provided, with experiencing occasional festinating episodes with gait and turns.  PT provides cues to stop, PWR! Up, PWR! Rock and then reset to start again.  Also provided cues for step back and weigthshift to initiate/complete turns with less festination.  Progressed to longer  distances, 230 ft, 3 sets, with turns, stops, starts, changes or direction, progressing to carrying cones with dynamic task, carrying weighted balls.  Negotiated through doorways, turns in darkened rooms, with cues for visual target ahead and with cues for use of step back, rock, then forward to start again (helps pt be more successful with initiating gait with festination episodes).      Reviewed turning practice, with cues to slow pace, pt is able to perform clock-turn, 90 degree turn method R and L, 2-3 reps each.    Balance Exercises - 04/05/19 0856      Balance Exercises: Standing   Stepping Strategy  Anterior;Posterior;Lateral;UE support;10 reps   stepping over obstacle ant, lateral for incr. step height   Retro Gait  Upper extremity support;5 reps   Forward/back walking at counter   Other Standing Exercises  Varied direction stepping, with increased cognitive challenge, progressing to holding weigthed balls while performing varied direction stepping for dual tasking.  Pt needs cues to slow pace of step/weigthshifting, as he tends to speed up with added dual task challenge.        PT Education - 04/06/19 1324    Education Details  Additional method to get out of festination with gait and turns    Person(s) Educated  Patient    Methods  Explanation;Demonstration;Handout    Comprehension  Verbalized understanding;Returned demonstration;Verbal cues required       PT Short Term Goals - 03/25/19 1302      PT SHORT TERM GOAL #1   Title  Pt will be independent with HEP for improved balance, transfers, gait.  TARGET 04/22/2019    Time  4    Period  Weeks    Status  New    Target Date  04/22/19      PT SHORT TERM GOAL #2   Title  Pt will improve 5x sit<>stand test to less than or equal to 13 seconds for improved transfer efficiency and safety.    Time  4    Period  Weeks    Status  New    Target Date  04/22/19      PT SHORT TERM GOAL #3   Title  The patient will perform 360  degree turn in less than 5 seconds for improved turn efficiency and safety.    Time  4    Period  Weeks    Status  New    Target Date  04/22/19      PT SHORT TERM GOAL #4   Title  Pt will verbalize/demo understanding of tips to reduce festination with turns and gait.    Time  4    Period  Weeks    Status  New    Target Date  04/22/19        PT Long Term Goals - 03/25/19 1306      PT LONG TERM GOAL #1   Title  Pt will verbalize/demonstrate understanding of lifting/carrying objects with bilateral UEs, for improved safety with gait.    Time  8    Period  Weeks    Status  New    Target Date  05/20/19      PT LONG TERM GOAL #2   Title  Pt will improve MiniBESTest score to at least 22/28 for decreased fall risk.    Time  8    Period  Weeks    Status  New    Target Date  05/20/19      PT LONG TERM GOAL #3   Title  Pt will improve TUG/TUG cognitive score to </= 10% difference for improved dual tasking.    Time  8    Period  Weeks    Status  New    Target Date  05/20/19            Plan - 04/06/19 1325    Clinical Impression Statement  Continued to work on progression of balance, turns, gait activities.  With added cognitive and dual task challenges, pt tends to increase pace of lower extremitiy movement, causing festination.  Pt responds well to cues to slow pace and to think through cues/varied methods to get out of festination episodes; however, pt has difficulty demonstrating these techniques in a slowed manner without cues.  Pt will continue to benefit from skilled PT to further address balance and gait deficits.    Personal Factors and Comorbidities  Comorbidity 3+    Comorbidities  PMH includes arthritis,DVT, A-fib, GERD, sleep apnea    Examination-Activity Limitations  Stand;Transfers;Locomotion Level    Examination-Participation Restrictions  Community Activity    Stability/Clinical Decision Making  Evolving/Moderate complexity    Rehab Potential  Good    PT  Frequency  2x / week    PT Duration  8 weeks   plus eval   PT Treatment/Interventions  ADLs/Self Care Home Management;DME Instruction;Balance training;Therapeutic exercise;Therapeutic activities;Functional mobility training;Gait training;Neuromuscular re-education;Patient/family education    PT Next Visit Plan  Continue to work on turns, gait, changes of direction, posterior direction, sit<>stand functional strengthening, addition of dual tasking; doorway negoitation (how did it go at home to use step back, rock, forward step method?)    Consulted and Agree with Plan of Care  Patient       Patient will benefit from skilled therapeutic intervention in order to improve the following deficits and impairments:  Abnormal gait, Decreased balance, Decreased mobility, Decreased strength, Postural dysfunction, Difficulty walking  Visit Diagnosis: Unsteadiness on feet  Other abnormalities of gait and mobility     Problem List Patient Active Problem List   Diagnosis Date Noted  . Parkinson's disease ([place]) 02/03/2018  . Community acquired pneumonia of left lower lobe of lung 12/23/2017  . Sepsis ([place]) 12/23/2017  . Morbid obesity due to excess calories ([place]) 08/23/2016  . OSA on CPAP 08/23/2016  . Dyspnea on exertion 08/22/2016  . Umbilical hernia [id number]  . Persistent atrial fibrillation ([place])   . Encounter for therapeutic drug monitoring 08/12/2013  . Long term current use of anticoagulant 07/30/2010  . COLONIC POLYPS 06/03/2010  . 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 GERD 06/03/2010  . HIATAL HERNIA 06/03/2010  . BENIGN PROSTATIC HYPERTROPHY, WITH OBSTRUCTION 06/03/2010  . PSORIASIS 06/03/2010    Kylo Gavin W. 04/06/2019, 1:28 PM  [facility] [address] [place], [place], [address] Phone: [phone]   Fax:  [phone]  Name: Stanley Wright MRN: [id number] Date of Birth: Jun 08, 1946

## 2019-04-07 ENCOUNTER — Other Ambulatory Visit: Payer: Self-pay

## 2019-04-07 ENCOUNTER — Ambulatory Visit: Payer: PPO | Admitting: Physical Therapy

## 2019-04-07 ENCOUNTER — Ambulatory Visit: Payer: PPO

## 2019-04-07 ENCOUNTER — Encounter: Payer: Self-pay | Admitting: Physical Therapy

## 2019-04-07 ENCOUNTER — Ambulatory Visit: Payer: PPO | Admitting: Occupational Therapy

## 2019-04-07 DIAGNOSIS — R2689 Other abnormalities of gait and mobility: Secondary | ICD-10-CM

## 2019-04-07 DIAGNOSIS — R29818 Other symptoms and signs involving the nervous system: Secondary | ICD-10-CM

## 2019-04-07 DIAGNOSIS — R278 Other lack of coordination: Secondary | ICD-10-CM

## 2019-04-07 DIAGNOSIS — R2681 Unsteadiness on feet: Secondary | ICD-10-CM

## 2019-04-07 DIAGNOSIS — R471 Dysarthria and anarthria: Secondary | ICD-10-CM

## 2019-04-07 NOTE — Therapy (Signed)
[facility] [address] [place], [place], [address] Phone: [phone]   Fax:  [phone]  Physical Therapy Treatment  Patient Details  Name: Stanley Wright MRN: [id number] Date of Birth: 09/18/1945 Referring Provider (PT): Tat, Wells Guiles   Encounter Date: 04/07/2019  PT End of Session - 04/07/19 1053    Visit Number  5    Number of Visits  17    Date for PT Re-Evaluation  06/23/19    Authorization Type  [employer]-10th visit progress note required    PT Start Time  0934    PT Stop Time  1015    PT Time Calculation (min)  41 min    Activity Tolerance  Patient tolerated treatment well    Behavior During Therapy  [facility] for tasks assessed/performed       Past Medical History:  Diagnosis Date  . Arthritis   . BENIGN PROSTATIC HYPERTROPHY, WITH OBSTRUCTION   . Cancer (HCC)    skin, basal, squamous  . COLONIC POLYPS   . Diverticulitis   . DVT (deep venous thrombosis) ([place]) 2000  . Dysrhythmia    Hx Afib- 2017  . Factor 5 Leiden mutation, heterozygous ([place])   . GERD (gastroesophageal reflux disease)    not on medication  . HIATAL HERNIA   . ITP (idiopathic thrombocytopenic purpura) 2003  . Long term current use of anticoagulant   . Parkinson's disease ([place]) 02/03/2018  . PSORIASIS   . Pulmonary embolus ([place]) 2000  . Shortness of breath dyspnea    at times - Talking alot  . Sleep apnea     Past Surgical History:  Procedure Laterality Date  . CARDIOVERSION N/A 11/22/2015   Procedure: CARDIOVERSION;  Surgeon: Sanda Klein, MD;  [facility];  Service: Cardiovascular;  Laterality: N/A;  . COLONOSCOPY    . HERNIA REPAIR Left    Inguinal- x2 . mesh x1  . INSERTION OF MESH N/A 02/25/2016   Procedure: INSERTION OF MESH;  Surgeon: Rolm Bookbinder, MD;  Location: [facility];  Service: General;  Laterality: N/A;  . LUNG REMOVAL, PARTIAL Right 2004   was fungal and not cancerous  . PROSTATE SURGERY     . UMBILICAL HERNIA REPAIR N/A 02/25/2016   Procedure: LAPAROSCOPIC UMBILICAL HERNIA REPAIR;  Surgeon: Rolm Bookbinder, MD;  Location: [facility];  Service: General;  Laterality: N/A;    There were no vitals filed for this visit.  Subjective Assessment - 04/07/19 0936    Subjective  Feel better today.  Sometimes the cues and the way I move is affected much more when my medications are off.    Pertinent History  Parkinson's disease; CHF    Patient Stated Goals  Pt's goal for PT is to get back to increased movement confidence.    Currently in Pain?  No/denies                       [facility] Adult PT Treatment/Exercise - 04/07/19 0939      Transfers   Transfers  Sit to Stand;Stand to Sit    Sit to Stand  5: Supervision;Without upper extremity assist;From chair/3-in-1    Sit to Stand Details (indicate cue type and reason)  Cues for forward lean, forward reach, to upright posture upon standing    Stand to Sit  5: Supervision;Without upper extremity assist;To chair/3-in-1    Number of Reps  Other reps (comment);Other sets (comment)   4 sets 5 reps from mat,  chair, mat with Airex, rockerboard    Comments  Sit<>stand performed while feet on Airex and rockerboard for added challenge of balance with sit<>stand.  Progresed to TUG shuttle  (sit>stand from mat, 10 ft walk to chair, stand>sit; sit>stand, walk 10 ft to mat to sit) x 3 reps, then progression to TUG shuttle with cognitive task x 5 reps.  Cues to focus on improved foot clearance with turns.      Ambulation/Gait   Ambulation/Gait  Yes    Ambulation/Gait Assistance  5: Supervision    Ambulation/Gait Assistance Details  Gait after dedicated lower extremity stretching, with pt improving step length and heelstrike.    Ambulation Distance (Feet)  230 Feet   50 ft x 2   Assistive device  None    Gait Pattern  Step-through pattern;Decreased step length - right;Decreased step length - left;Decreased dorsiflexion - right;Decreased  dorsiflexion - left;Festinating;Shuffle;Decreased trunk rotation;Narrow base of support;Poor foot clearance - left;Poor foot clearance - right;Step-to pattern;Decreased arm swing - left    Ambulation Surface  Level;Indoor      Exercises   Exercises  Knee/Hip;Ankle      Knee/Hip Exercises: Stretches   Gastroc Stretch  Right;Left;3 reps;20 seconds    Gastroc Stretch Limitations  foot propped at 6" step with bilat UE support, cues for looking ahead, not down    Other Knee/Hip Stretches  Runner's stretch 3 reps 15 seconds, with BUE support, cues to look ahead      Knee/Hip Exercises: Standing   Forward Step Up  Right;Left;1 set;10 reps;Hand Hold: 2;Step Height: 6"    Forward Step Up Limitations  Cues to maintain slowed pace    Other Standing Knee Exercises  Alternating step taps, 2 sets x 10 reps, with cues for full foot placement, lean forward for slowed pace and improved foot clearance;  forward step taps>runner's stretch position, 5 reps each leg for imrpoved flexibility of hips and ankles      Ankle Exercises: Seated   Toe Raise  10 reps;3 seconds   2 sets            PT Education - 04/07/19 1052    Education Details  Additions to HEP-flexibility and ankle strengthening; use of heelstrike with gait (slows pace and improves step length)    Person(s) Educated  Patient    Methods  Explanation;Demonstration;Handout    Comprehension  Verbalized understanding;Returned demonstration       PT Short Term Goals - 03/25/19 1302      PT SHORT TERM GOAL #1   Title  Pt will be independent with HEP for improved balance, transfers, gait.  TARGET 04/22/2019    Time  4    Period  Weeks    Status  New    Target Date  04/22/19      PT SHORT TERM GOAL #2   Title  Pt will improve 5x sit<>stand test to less than or equal to 13 seconds for improved transfer efficiency and safety.    Time  4    Period  Weeks    Status  New    Target Date  04/22/19      PT SHORT TERM GOAL #3   Title  The  patient will perform 360 degree turn in less than 5 seconds for improved turn efficiency and safety.    Time  4    Period  Weeks    Status  New    Target Date  04/22/19      PT  SHORT TERM GOAL #4   Title  Pt will verbalize/demo understanding of tips to reduce festination with turns and gait.    Time  4    Period  Weeks    Status  New    Target Date  04/22/19        PT Long Term Goals - 03/25/19 1306      PT LONG TERM GOAL #1   Title  Pt will verbalize/demonstrate understanding of lifting/carrying objects with bilateral UEs, for improved safety with gait.    Time  8    Period  Weeks    Status  New    Target Date  05/20/19      PT LONG TERM GOAL #2   Title  Pt will improve MiniBESTest score to at least 22/28 for decreased fall risk.    Time  8    Period  Weeks    Status  New    Target Date  05/20/19      PT LONG TERM GOAL #3   Title  Pt will improve TUG/TUG cognitive score to </= 10% difference for improved dual tasking.    Time  8    Period  Weeks    Status  New    Target Date  05/20/19            Plan - 04/07/19 1053    Clinical Impression Statement  Focus of skilled PT session today on lower extremity functional strengthening and flexibility exercises.  After hip and ankle flexibility, pt is able ambulate with increased step length, improved attention to foot clearance and heelstrike, as well as slowed pace and no festination.  Pt will continue to benefit from skilled PT to address balance and gait for improved functional mobility.    Personal Factors and Comorbidities  Comorbidity 3+    Comorbidities  PMH includes arthritis,DVT, A-fib, GERD, sleep apnea    Examination-Activity Limitations  Stand;Transfers;Locomotion Level    Examination-Participation Restrictions  Community Activity    Stability/Clinical Decision Making  Evolving/Moderate complexity    Rehab Potential  Good    PT Frequency  2x / week    PT Duration  8 weeks   plus eval   PT  Treatment/Interventions  ADLs/Self Care Home Management;DME Instruction;Balance training;Therapeutic exercise;Therapeutic activities;Functional mobility training;Gait training;Neuromuscular re-education;Patient/family education    PT Next Visit Plan  Functional strengthening, stretching, gait and changes of direction; dual tasking with gait.    Consulted and Agree with Plan of Care  Patient       Patient will benefit from skilled therapeutic intervention in order to improve the following deficits and impairments:  Abnormal gait, Decreased balance, Decreased mobility, Decreased strength, Postural dysfunction, Difficulty walking  Visit Diagnosis: Unsteadiness on feet  Other abnormalities of gait and mobility  Other symptoms and signs involving the nervous system     Problem List Patient Active Problem List   Diagnosis Date Noted  . Parkinson's disease ([place]) 02/03/2018  . Community acquired pneumonia of left lower lobe of lung 12/23/2017  . Sepsis ([place]) 12/23/2017  . Morbid obesity due to excess calories ([place]) 08/23/2016  . OSA on CPAP 08/23/2016  . Dyspnea on exertion 08/22/2016  . Umbilical hernia [id number]  . Persistent atrial fibrillation ([place])   . Encounter for therapeutic drug monitoring 08/12/2013  . Long term current use of anticoagulant 07/30/2010  . COLONIC POLYPS 06/03/2010  . 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 GERD 06/03/2010  . HIATAL HERNIA 06/03/2010  . BENIGN PROSTATIC HYPERTROPHY, WITH  OBSTRUCTION 06/03/2010  . PSORIASIS 06/03/2010    Marycruz Boehner W. 04/07/2019, 10:56 AM  Frazier Butt., PT   [facility] [address] [place], [place], [address] Phone: [phone]   Fax:  [phone]  Name: DAYJON EDMON MRN: [id number] Date of Birth: 1945-08-28

## 2019-04-07 NOTE — Patient Instructions (Signed)
  Practice your Sunday school lesson at least 3-4 times focusing on "loud" and "plain"

## 2019-04-07 NOTE — Therapy (Signed)
[facility] [address] [place], [place], [address] Phone: [phone]   Fax:  [phone]  Occupational Therapy Treatment  Patient Details  Name: Stanley Wright MRN: [id number] Date of Birth: November 19, 1945 Referring Provider (OT): Dr. Carles Collet   Encounter Date: 04/07/2019  OT End of Session - 04/07/19 1019    Visit Number  4    Number of Visits  11    Date for OT Re-Evaluation  05/01/19    Authorization Type  [employer]    Authorization - Visit Number  4    Authorization - Number of Visits  10    OT Start Time  1017    OT Stop Time  1057    OT Time Calculation (min)  40 min    Activity Tolerance  Patient tolerated treatment well    Behavior During Therapy  [facility] for tasks assessed/performed       Past Medical History:  Diagnosis Date  . Arthritis   . BENIGN PROSTATIC HYPERTROPHY, WITH OBSTRUCTION   . Cancer (HCC)    skin, basal, squamous  . COLONIC POLYPS   . Diverticulitis   . DVT (deep venous thrombosis) ([place]) 2000  . Dysrhythmia    Hx Afib- 2017  . Factor 5 Leiden mutation, heterozygous ([place])   . GERD (gastroesophageal reflux disease)    not on medication  . HIATAL HERNIA   . ITP (idiopathic thrombocytopenic purpura) 2003  . Long term current use of anticoagulant   . Parkinson's disease ([place]) 02/03/2018  . PSORIASIS   . Pulmonary embolus ([place]) 2000  . Shortness of breath dyspnea    at times - Talking alot  . Sleep apnea     Past Surgical History:  Procedure Laterality Date  . CARDIOVERSION N/A 11/22/2015   Procedure: CARDIOVERSION;  Surgeon: Sanda Klein, MD;  [facility];  Service: Cardiovascular;  Laterality: N/A;  . COLONOSCOPY    . HERNIA REPAIR Left    Inguinal- x2 . mesh x1  . INSERTION OF MESH N/A 02/25/2016   Procedure: INSERTION OF MESH;  Surgeon: Rolm Bookbinder, MD;  Location: [facility];  Service: General;  Laterality: N/A;  . LUNG REMOVAL, PARTIAL Right 2004   was fungal and  not cancerous  . PROSTATE SURGERY    . UMBILICAL HERNIA REPAIR N/A 02/25/2016   Procedure: LAPAROSCOPIC UMBILICAL HERNIA REPAIR;  Surgeon: Rolm Bookbinder, MD;  Location: [facility];  Service: General;  Laterality: N/A;    There were no vitals filed for this visit.  Subjective Assessment - 04/07/19 1036    Currently in Pain?  No/denies         Treatment: Tracing activity using foam grip on pen followed by writing alphabet in caps, then pt transitioned to writing his name and a grocery list  In all caps and large size.  Pt was able to complete with good legibility and letter size. Grooved pegs for increased fine motor coordination with RUE, min difficulty Dealing playing cards with each UE, mod v.c for performance with RUE.                  OT Education - 04/07/19 1042    Education Details  modified quadraped PWR! up, rock and twist 10-20 reps each    Person(s) Educated  Patient    Methods  Explanation;Demonstration;Verbal cues;Handout    Comprehension  Verbalized understanding;Returned demonstration;Verbal cues required          OT Long Term Goals - 04/07/19 1020  OT LONG TERM GOAL #1   Title  I with PD specific HEP.    Time  5    Period  Weeks    Status  New      OT LONG TERM GOAL #2   Title  Pt will demonstrate increased ease with fatening buttons as evidenced by decreasing 3 button/ un button test  to 45 secs or less    Baseline  53.03    Time  5    Period  Weeks    Status  New      OT LONG TERM GOAL #3   Title  Pt will verbalzie understanding of adpated strategies for ADLs/IADLs. and ways to prevent future PD related complications.    Time  5    Period  Weeks    Status  New      OT LONG TERM GOAL #4   Title  Pt will demonstrate ability to write a sentence with 90% legibility and no significant decrease in letter size.    Baseline  50% legible sentence level    Time  5    Period  Weeks    Status  New      OT LONG TERM GOAL #5   Title  Pt  will report increased ease with fastening his seatbelt.    Time  5    Period  Weeks    Status  New      OT LONG TERM GOAL #6   Title  Pt will increase bilateral shoulder flexion by 5* for increased ease with IADLs.    Baseline  RUE 125, LUE 120    Time  5    Period  Weeks    Status  New            Plan - 04/07/19 1036    Clinical Impression Statement  Pt is progressing towards goals, he requires verbal cues for timeing and larger amplitude movements.    OT Occupational Profile and History  Problem Focused Assessment - Including review of records relating to presenting problem    Occupational performance deficits (Please refer to evaluation for details):  ADL's;IADL's;Social Participation    Body Structure / Function / Physical Skills  ADL;UE functional use;Balance;FMC;Gait;ROM;GMC;Coordination;Decreased knowledge of precautions;Decreased knowledge of use of DME;IADL;Strength;Mobility;Dexterity    Rehab Potential  Good    Clinical Decision Making  Limited treatment options, no task modification necessary    Comorbidities Affecting Occupational Performance:  May have comorbidities impacting occupational performance    Modification or Assistance to Complete Evaluation   No modification of tasks or assist necessary to complete eval    OT Frequency  2x / week    OT Duration  --   5 weeks plus eval   OT Treatment/Interventions  Self-care/ADL training;Ultrasound;Energy conservation;Aquatic Therapy;DME and/or AE instruction;Patient/family education;Balance training;Passive range of motion;Paraffin;Gait Training;Fluidtherapy;Cryotherapy;Therapist, nutritional;Therapeutic exercise;Moist Heat;Manual Therapy;Therapeutic activities;Cognitive remediation/compensation;Neuromuscular education    Plan  big movements with functional activity    OT Home Exercise Plan  coordination, seated and modifed quadraped PWR(up, rock and twist    Consulted and Agree with Plan of Care  Patient        Patient will benefit from skilled therapeutic intervention in order to improve the following deficits and impairments:   Body Structure / Function / Physical Skills: ADL, UE functional use, Balance, FMC, Gait, ROM, GMC, Coordination, Decreased knowledge of precautions, Decreased knowledge of use of DME, IADL, Strength, Mobility, Dexterity       Visit Diagnosis: Other  lack of coordination  Other symptoms and signs involving the nervous system  Other abnormalities of gait and mobility  Unsteadiness on feet    Problem List Patient Active Problem List   Diagnosis Date Noted  . Parkinson's disease ([place]) 02/03/2018  . Community acquired pneumonia of left lower lobe of lung 12/23/2017  . Sepsis ([place]) 12/23/2017  . Morbid obesity due to excess calories ([place]) 08/23/2016  . OSA on CPAP 08/23/2016  . Dyspnea on exertion 08/22/2016  . Umbilical hernia [id number]  . Persistent atrial fibrillation ([place])   . Encounter for therapeutic drug monitoring 08/12/2013  . Long term current use of anticoagulant 07/30/2010  . COLONIC POLYPS 06/03/2010  . 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 GERD 06/03/2010  . HIATAL HERNIA 06/03/2010  . BENIGN PROSTATIC HYPERTROPHY, WITH OBSTRUCTION 06/03/2010  . PSORIASIS 06/03/2010    , 04/07/2019, 10:48 AM  [facility] [address] [place], [place], [address] Phone: [phone]   Fax:  [phone]  Name: Stanley Wright MRN: [id number] Date of Birth: Jan 19, 1946

## 2019-04-07 NOTE — Patient Instructions (Signed)
Access Code: [email or web address]  URL: [email or web address]  Date: 04/07/2019  Prepared by: Mady Haagensen   Exercises Standing Gastroc Stretch on Step - 3 reps - 1 sets - 15-30 hold - 1-2x daily - 7x weekly Standing Gastroc Stretch - 3 reps - 1 sets - 30 sec hold - 1-2x daily - 7x weekly Seated Ankle Pumps - 10 reps - 2 sets - 3 sec hold - 1-2x daily - 7x weekly

## 2019-04-07 NOTE — Therapy (Signed)
[facility] [address] [place], [place], [address] Phone: [phone]   Fax:  [phone]  Speech Language Pathology Treatment  Patient Details  Name: Stanley Wright MRN: [id number] Date of Birth: [birth year] Referring Provider (SLP): Alonza Bogus, DO   Encounter Date: 04/07/2019  End of Session - 04/07/19 1205    Visit Number  5    Number of Visits  17    Date for SLP Re-Evaluation  06/22/19    SLP Start Time  0850    SLP Stop Time   0930    SLP Time Calculation (min)  40 min    Activity Tolerance  Patient tolerated treatment well       Past Medical History:  Diagnosis Date  . Arthritis   . BENIGN PROSTATIC HYPERTROPHY, WITH OBSTRUCTION   . Cancer (HCC)    skin, basal, squamous  . COLONIC POLYPS   . Diverticulitis   . DVT (deep venous thrombosis) ([place]) 2000  . Dysrhythmia    Hx Afib- 2017  . Factor 5 Leiden mutation, heterozygous ([place])   . GERD (gastroesophageal reflux disease)    not on medication  . HIATAL HERNIA   . ITP (idiopathic thrombocytopenic purpura) 2003  . Long term current use of anticoagulant   . Parkinson's disease ([place]) 02/03/2018  . PSORIASIS   . Pulmonary embolus ([place]) 2000  . Shortness of breath dyspnea    at times - Talking alot  . Sleep apnea     Past Surgical History:  Procedure Laterality Date  . CARDIOVERSION N/A 11/22/2015   Procedure: CARDIOVERSION;  Surgeon: Sanda Klein, MD;  [facility];  Service: Cardiovascular;  Laterality: N/A;  . COLONOSCOPY    . HERNIA REPAIR Left    Inguinal- x2 . mesh x1  . INSERTION OF MESH N/A 02/25/2016   Procedure: INSERTION OF MESH;  Surgeon: Rolm Bookbinder, MD;  Location: [facility];  Service: General;  Laterality: N/A;  . LUNG REMOVAL, PARTIAL Right 2004   was fungal and not cancerous  . PROSTATE SURGERY    . UMBILICAL HERNIA REPAIR N/A 02/25/2016   Procedure: LAPAROSCOPIC UMBILICAL HERNIA REPAIR;  Surgeon: Rolm Bookbinder,  MD;  Location: [facility];  Service: General;  Laterality: N/A;    There were no vitals filed for this visit.  Subjective Assessment - 04/07/19 0925    Subjective  Expreses desire not to use microphone.    Currently in Pain?  No/denies            ADULT SLP TREATMENT - 04/07/19 0927      General Information   Behavior/Cognition  Alert;Cooperative;Pleasant mood      Treatment Provided   Treatment provided  Cognitive-Linquistic      Cognitive-Linquistic Treatment   Treatment focused on  Dysarthria    Skilled Treatment  Pt produced loud "hey - ah" at upper 80s lower 90s dB average, with mild, but rare strained strangled quality with "hey /a/". Per pt preference, he went through and read his scripture for Sunday verse by verse, pt req'd rare min cues to focus on each word as it's own entity. Pt req'd rare min A for loudness (upper 60s - lower 70s dB, masked). 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       Assessment / Recommendations / Plan   Plan  Continue  with current plan of care      Progression Toward Goals   Progression toward goals  Progressing toward goals       SLP Education - 04/07/19 1204    Education Details  practicing Sunday school lesson fosucsing on clarity and loudness is beneficial    Person(s) Educated  Patient    Methods  Explanation    Comprehension  Verbalized understanding       SLP Short Term Goals - 04/07/19 1207      SLP SHORT TERM GOAL #1   Title  pt will produce loud /a/or "Hey-ah" at average low 90s dB over 4 sessions    Time  3    Period  Weeks    Status  On-going      SLP SHORT TERM GOAL #2   Title  pt will generate 18/20 sentences with WNL average volume over 3 sessions    Baseline  04-07-19    Time  3    Period  Weeks    Status  On-going      SLP SHORT TERM GOAL #3   Title  pt will  use abdominal breathing at rest 85% success over 2 sessions    Time  3    Period  Weeks    Status  On-going      SLP SHORT TERM GOAL #4   Title  In 5 minutes simple conversation pt will achieve average speech volume of low 70s dB over two sessions    Time  3    Period  Weeks    Status  On-going       SLP Long Term Goals - 04/07/19 1207      SLP LONG TERM GOAL #1   Title  Pt will generate loud /a/ with average of low 90s dB over total of 6 sessions    Time  7    Period  Weeks   or 17 sessions, for all LTGs   Status  On-going      SLP LONG TERM GOAL #2   Title  pt will use abdominal breathing 70% of the time in 8 minutes simple-mod complex conversation    Time  7    Period  Weeks    Status  On-going      SLP LONG TERM GOAL #3   Title  In 10 minutes simple-mod complex conversation pt will achieve average speech volume of low 70s dB over four sessions    Time  7    Period  Weeks    Status  On-going       Plan - 04/07/19 1205    Clinical Impression Statement  Pt presents today with 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 Pt cont with more success when slowing rate and limiting rushing of speech in reading tasks and some structured tasks - carryover seen in conversation occasionally.  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 SLP believes pt will cont to benefit from skilled ST targeting incr'd volume and greater usage of abdomoinal breathing in order to improve communicative effectiveness.    Speech Therapy Frequency  2x / week    Duration  --   8 weeks or 17 sessions   Treatment/Interventions  SLP instruction and feedback;Compensatory strategies;Patient/family education;Functional tasks;Cueing hierarchy;Multimodal  communcation approach;Environmental  controls;Aspiration precaution training;Pharyngeal strengthening exercises;Diet toleration management by SLP    Potential to Achieve Goals  Good    Consulted and Agree with Plan of Care  Patient       Patient will benefit from skilled therapeutic intervention in order to improve the following deficits and impairments:   Dysarthria and anarthria    Problem List Patient Active Problem List   Diagnosis Date Noted  . Parkinson's disease ([place]) 02/03/2018  . Community acquired pneumonia of left lower lobe of lung 12/23/2017  . Sepsis ([place]) 12/23/2017  . Morbid obesity due to excess calories ([place]) 08/23/2016  . OSA on CPAP 08/23/2016  . Dyspnea on exertion 08/22/2016  . Umbilical hernia [id number]  . Persistent atrial fibrillation ([place])   . Encounter for therapeutic drug monitoring 08/12/2013  . Long term current use of anticoagulant 07/30/2010  . COLONIC POLYPS 06/03/2010  . 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 GERD 06/03/2010  . HIATAL HERNIA 06/03/2010  . BENIGN PROSTATIC HYPERTROPHY, WITH OBSTRUCTION 06/03/2010  . PSORIASIS 06/03/2010    Shandreka Dante ,[place], [place]  04/07/2019, 12:08 PM.  [facility] [address] [place], [place], [address] Phone: [phone]   Fax:  [phone]   Name: Stanley Wright MRN: [id number] Date of Birth: [birth year]

## 2019-04-08 ENCOUNTER — Ambulatory Visit (INDEPENDENT_AMBULATORY_CARE_PROVIDER_SITE_OTHER): Payer: PPO | Admitting: Pharmacist

## 2019-04-08 DIAGNOSIS — Z5181 Encounter for therapeutic drug level monitoring: Secondary | ICD-10-CM

## 2019-04-08 DIAGNOSIS — I4819 Other persistent atrial fibrillation: Secondary | ICD-10-CM | POA: Diagnosis not present

## 2019-04-08 LAB — POCT INR: INR: 2.9 (ref 2.0–3.0)

## 2019-04-08 NOTE — Patient Instructions (Signed)
Continue taking 1 tablet every day. Recheck INR in 4 weeks. Call with any questions if gets any new medications or scheduled for any procedures  [phone]

## 2019-04-11 ENCOUNTER — Other Ambulatory Visit: Payer: Self-pay | Admitting: Cardiology

## 2019-04-11 NOTE — Telephone Encounter (Signed)
Refill request

## 2019-04-14 ENCOUNTER — Ambulatory Visit: Payer: PPO | Admitting: Speech Pathology

## 2019-04-14 ENCOUNTER — Ambulatory Visit: Payer: PPO | Admitting: Physical Therapy

## 2019-04-14 ENCOUNTER — Encounter: Payer: PPO | Admitting: Occupational Therapy

## 2019-04-14 ENCOUNTER — Ambulatory Visit: Payer: PPO | Admitting: Occupational Therapy

## 2019-04-14 ENCOUNTER — Encounter: Payer: Self-pay | Admitting: Physical Therapy

## 2019-04-14 ENCOUNTER — Encounter: Payer: Self-pay | Admitting: Occupational Therapy

## 2019-04-14 ENCOUNTER — Encounter: Payer: PPO | Admitting: Speech Pathology

## 2019-04-14 ENCOUNTER — Other Ambulatory Visit: Payer: Self-pay

## 2019-04-14 DIAGNOSIS — R29818 Other symptoms and signs involving the nervous system: Secondary | ICD-10-CM

## 2019-04-14 DIAGNOSIS — R2681 Unsteadiness on feet: Secondary | ICD-10-CM

## 2019-04-14 DIAGNOSIS — R2689 Other abnormalities of gait and mobility: Secondary | ICD-10-CM

## 2019-04-14 DIAGNOSIS — R278 Other lack of coordination: Secondary | ICD-10-CM

## 2019-04-14 DIAGNOSIS — R471 Dysarthria and anarthria: Secondary | ICD-10-CM

## 2019-04-14 NOTE — Therapy (Signed)
[facility] [address] [place], [place], [address] Phone: [phone]   Fax:  [phone]  Speech Language Pathology Treatment  Patient Details  Name: Stanley Wright MRN: [id number] Date of Birth: Apr 10, 1946 Referring Provider (SLP): Alonza Bogus, DO   Encounter Date: 04/14/2019  End of Session - 04/14/19 0932    Visit Number  6    Number of Visits  17    Date for SLP Re-Evaluation  06/22/19    SLP Start Time  0845    SLP Stop Time   0925    SLP Time Calculation (min)  40 min       Past Medical History:  Diagnosis Date  . Arthritis   . BENIGN PROSTATIC HYPERTROPHY, WITH OBSTRUCTION   . Cancer (HCC)    skin, basal, squamous  . COLONIC POLYPS   . Diverticulitis   . DVT (deep venous thrombosis) ([place]) 2000  . Dysrhythmia    Hx Afib- 2017  . Factor 5 Leiden mutation, heterozygous ([place])   . GERD (gastroesophageal reflux disease)    not on medication  . HIATAL HERNIA   . ITP (idiopathic thrombocytopenic purpura) 2003  . Long term current use of anticoagulant   . Parkinson's disease ([place]) 02/03/2018  . PSORIASIS   . Pulmonary embolus ([place]) 2000  . Shortness of breath dyspnea    at times - Talking alot  . Sleep apnea     Past Surgical History:  Procedure Laterality Date  . CARDIOVERSION N/A 11/22/2015   Procedure: CARDIOVERSION;  Surgeon: Sanda Klein, MD;  [facility];  Service: Cardiovascular;  Laterality: N/A;  . COLONOSCOPY    . HERNIA REPAIR Left    Inguinal- x2 . mesh x1  . INSERTION OF MESH N/A 02/25/2016   Procedure: INSERTION OF MESH;  Surgeon: Rolm Bookbinder, MD;  Location: [facility];  Service: General;  Laterality: N/A;  . LUNG REMOVAL, PARTIAL Right 2004   was fungal and not cancerous  . PROSTATE SURGERY    . UMBILICAL HERNIA REPAIR N/A 02/25/2016   Procedure: LAPAROSCOPIC UMBILICAL HERNIA REPAIR;  Surgeon: Rolm Bookbinder, MD;  Location: [facility];  Service: General;  Laterality:  N/A;    There were no vitals filed for this visit.  Subjective Assessment - 04/14/19 0851    Subjective  "It went OK, not as good as I hoped, but I was pleased with it"    Currently in Pain?  No/denies            ADULT SLP TREATMENT - 04/14/19 0926      General Information   Behavior/Cognition  Alert;Cooperative;Pleasant mood      Treatment Provided   Treatment provided  Cognitive-Linquistic      Cognitive-Linquistic Treatment   Treatment focused on  Dysarthria    Skilled Treatment  Pt enters ST room with sub WNL volume. Loud /a/ to recalibrate volume with mod I averaged 88dB. Pt read scripture with usual mod A for breath support, practiced several times to target  more frequent inspiration. Structured speech tasks, generating 3-4 sentece descrption of object, with instructions to inspire prior to each sentence and think shout, averaged 72dB. Simple conversation with usual verbal cues to breath and get loud averaged 68dB. 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 10 being perfect volume      Assessment / Recommendations / Plan   Plan  Continue with current plan of care      Progression Toward Goals  Progression toward goals  Progressing toward goals       SLP Education - 04/14/19 0929    Education Details  breath more frequently, continue practicing Sunday school lessions       SLP Short Term Goals - 04/14/19 0930      SLP SHORT TERM GOAL #1   Title  pt will produce loud /a/or "Hey-ah" at average low 90s dB over 4 sessions    Time  2    Period  Weeks    Status  On-going      SLP SHORT TERM GOAL #2   Title  pt will generate 18/20 sentences with WNL average volume over 3 sessions    Baseline  04-07-19, 04-14-19;    Time  2    Period  Weeks    Status  On-going      SLP SHORT TERM GOAL #3   Title  pt will use abdominal breathing at rest 85% success over 2 sessions    Time  2    Period  Weeks    Status  On-going      SLP SHORT TERM GOAL #4    Title  In 5 minutes simple conversation pt will achieve average speech volume of low 70s dB over two sessions    Time  2    Period  Weeks    Status  On-going       SLP Long Term Goals - 04/14/19 0931      SLP LONG TERM GOAL #1   Title  Pt will generate loud /a/ with average of low 90s dB over total of 6 sessions    Time  6    Period  Weeks   or 17 sessions, for all LTGs   Status  On-going      SLP LONG TERM GOAL #2   Title  pt will use abdominal breathing 70% of the time in 8 minutes simple-mod complex conversation    Time  6    Period  Weeks    Status  On-going      SLP LONG TERM GOAL #3   Title  In 10 minutes simple-mod complex conversation pt will achieve average speech volume of low 70s dB over four sessions    Time  6    Period  Weeks    Status  On-going       Plan - 04/14/19 0930    Clinical Impression Statement  Pt presents today with 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 Pt cont with more success when slowing rate and limiting rushing of speech in reading tasks and some structured tasks - carryover seen in conversation occasionally.  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 SLP believes pt will cont to benefit from skilled ST targeting incr'd volume and greater usage of abdomoinal breathing in order to improve communicative effectiveness.    Speech Therapy Frequency  2x / week    Duration  --   8 weeks or 17 visits   Treatment/Interventions  SLP instruction and feedback;Compensatory strategies;Patient/family education;Functional tasks;Cueing hierarchy;Multimodal communcation approach;Environmental controls;Aspiration precaution training;Pharyngeal strengthening exercises;Diet toleration management by SLP    Potential to Achieve Goals  Good        Patient will benefit from skilled therapeutic intervention in order to  improve the following deficits and impairments:   Dysarthria and anarthria    Problem List Patient Active Problem List   Diagnosis Date Noted  . Parkinson's disease ([place]) 02/03/2018  . Community acquired pneumonia of left lower lobe of lung 12/23/2017  . Sepsis ([place]) 12/23/2017  . Morbid obesity due to excess calories ([place]) 08/23/2016  . OSA on CPAP 08/23/2016  . Dyspnea on exertion 08/22/2016  . Umbilical hernia [id number]  . Persistent atrial fibrillation ([place])   . Encounter for therapeutic drug monitoring 08/12/2013  . Long term current use of anticoagulant 07/30/2010  . COLONIC POLYPS 06/03/2010  . 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 GERD 06/03/2010  . HIATAL HERNIA 06/03/2010  . BENIGN PROSTATIC HYPERTROPHY, WITH OBSTRUCTION 06/03/2010  . PSORIASIS 06/03/2010    Rosalena Mccorry, Annye Rusk MS, CCC-SLP 04/14/2019, 9:32 AM  [facility] [address] [place], [place], [address] Phone: [phone]   Fax:  [phone]   Name: OLTON PREJEAN MRN: [id number] Date of Birth: [birth year]

## 2019-04-14 NOTE — Therapy (Signed)
[facility] [address] [place], [place], [address] Phone: [phone]   Fax:  [phone]  Physical Therapy Treatment  Patient Details  Name: Stanley Wright MRN: [id number] Date of Birth: Jul 17, 1945 Referring Provider (PT): Tat, Wells Guiles   Encounter Date: 04/14/2019  PT End of Session - 04/14/19 1314    Visit Number  6    Number of Visits  17    Date for PT Re-Evaluation  06/23/19    Authorization Type  [employer]-10th visit progress note required    PT Start Time  0938    PT Stop Time  1017    PT Time Calculation (min)  39 min    Activity Tolerance  Patient tolerated treatment well    Behavior During Therapy  [facility] for tasks assessed/performed       Past Medical History:  Diagnosis Date  . Arthritis   . BENIGN PROSTATIC HYPERTROPHY, WITH OBSTRUCTION   . Cancer (HCC)    skin, basal, squamous  . COLONIC POLYPS   . Diverticulitis   . DVT (deep venous thrombosis) ([place]) 2000  . Dysrhythmia    Hx Afib- 2017  . Factor 5 Leiden mutation, heterozygous ([place])   . GERD (gastroesophageal reflux disease)    not on medication  . HIATAL HERNIA   . ITP (idiopathic thrombocytopenic purpura) 2003  . Long term current use of anticoagulant   . Parkinson's disease ([place]) 02/03/2018  . PSORIASIS   . Pulmonary embolus ([place]) 2000  . Shortness of breath dyspnea    at times - Talking alot  . Sleep apnea     Past Surgical History:  Procedure Laterality Date  . CARDIOVERSION N/A 11/22/2015   Procedure: CARDIOVERSION;  Surgeon: Sanda Klein, MD;  [facility];  Service: Cardiovascular;  Laterality: N/A;  . COLONOSCOPY    . HERNIA REPAIR Left    Inguinal- x2 . mesh x1  . INSERTION OF MESH N/A 02/25/2016   Procedure: INSERTION OF MESH;  Surgeon: Rolm Bookbinder, MD;  Location: [place];  Service: General;  Laterality: N/A;  . LUNG REMOVAL, PARTIAL Right 2004   was fungal and not cancerous  . PROSTATE SURGERY     . UMBILICAL HERNIA REPAIR N/A 02/25/2016   Procedure: LAPAROSCOPIC UMBILICAL HERNIA REPAIR;  Surgeon: Rolm Bookbinder, MD;  Location: [facility];  Service: General;  Laterality: N/A;    There were no vitals filed for this visit.  Subjective Assessment - 04/14/19 1311    Subjective  Denies falls or changes.  Rports having a hard time with freezing at home still and worse when he carries an object especially in front of him.    Currently in Pain?  No/denies          [facility] Adult PT Treatment/Exercise - 04/14/19 0001      Transfers   Transfers  Sit to Stand;Stand to Sit    Sit to Stand  5: Supervision;Without upper extremity assist;From chair/3-in-1    Stand to Sit  5: Supervision;Without upper extremity assist;To chair/3-in-1      Ambulation/Gait   Ambulation/Gait  Yes    Ambulation/Gait Assistance  5: Supervision    Ambulation Distance (Feet)  500 Feet   x 3 plus during activities in gym   Assistive device  None    Gait Pattern  Step-through pattern;Decreased step length - right;Decreased step length - left;Decreased dorsiflexion - right;Decreased dorsiflexion - left;Festinating;Shuffle;Decreased trunk rotation;Narrow base of support;Poor foot clearance - left;Poor foot clearance - right;Step-to pattern;Decreased  arm swing - left    Ambulation Surface  Level;Indoor    Pre-Gait Activities  Forward<>backward step weight shifting and quarter turns to address freezing    Gait Comments  Gait activities, incorporating in gym with turns and changes of direction, carrying item in 15 lb bell weight in one hand, then progressing to carrying in front with bil hands.  Supervision provided, with experiencing occasional festinating episodes with gait and turns.  PT provides cues to stop, PWR! Up, PWR! Rock and then reset to start again.  Also provided cues for step back and weigthshift to initiate/complete turns with less festination.  Progressed to longer distances, Negotiated through doorways, turns  in darkened rooms, with cues for visual target ahead and with cues for use of step back, rock, then forward to start again (helps pt be more successful with initiating gait with festination episodes).   In tight spaces working on turns and side stepping.     Posture/Postural Control   Posture/Postural Control  Postural limitations    Postural Limitations  Rounded Shoulders;Forward head      High Level Balance   High Level Balance Activities  Backward walking;Direction changes      Self-Care   Self-Care  Other Self-Care Comments    Other Self-Care Comments   Tips to reduce freezing      Exercises   Exercises  Knee/Hip;Ankle      Knee/Hip Exercises: Stretches   Gastroc Stretch  Right;Left;2 reps;30 seconds    Gastroc Stretch Limitations  foot propped at 6" step with bilat UE support, cues for looking ahead, not down      Knee/Hip Exercises: Standing   Heel Raises  Both;20 reps    Hip Flexion  Both;1 set;15 reps;Knee bent   marching   Forward Step Up  Right;Left;1 set;15 reps;Hand Hold: 2;Step Height: 6"    Forward Step Up Limitations  Cues to maintain slowed pace    Other Standing Knee Exercises  Alternating step taps, 2 sets x 15 reps, with cues for full foot placement, lean forward for slowed pace and improved foot clearance;  completed 15 reps again with taps to 6", 12", 6', floor and bil UE assist.      Ankle Exercises: Seated   Toe Raise  15 reps             PT Education - 04/14/19 1312    Education Details  Tips to reduce freezing, using tape in floor as visual target at home    Person(s) Educated  Patient    Methods  Explanation;Demonstration    Comprehension  Verbalized understanding;Other (comment)   needs reinforcement      PT Short Term Goals - 03/25/19 1302      PT SHORT TERM GOAL #1   Title  Pt will be independent with HEP for improved balance, transfers, gait.  TARGET 04/22/2019    Time  4    Period  Weeks    Status  New    Target Date  04/22/19       PT SHORT TERM GOAL #2   Title  Pt will improve 5x sit<>stand test to less than or equal to 13 seconds for improved transfer efficiency and safety.    Time  4    Period  Weeks    Status  New    Target Date  04/22/19      PT SHORT TERM GOAL #3   Title  The patient will perform 360 degree turn in less  than 5 seconds for improved turn efficiency and safety.    Time  4    Period  Weeks    Status  New    Target Date  04/22/19      PT SHORT TERM GOAL #4   Title  Pt will verbalize/demo understanding of tips to reduce festination with turns and gait.    Time  4    Period  Weeks    Status  New    Target Date  04/22/19        PT Long Term Goals - 03/25/19 1306      PT LONG TERM GOAL #1   Title  Pt will verbalize/demonstrate understanding of lifting/carrying objects with bilateral UEs, for improved safety with gait.    Time  8    Period  Weeks    Status  New    Target Date  05/20/19      PT LONG TERM GOAL #2   Title  Pt will improve MiniBESTest score to at least 22/28 for decreased fall risk.    Time  8    Period  Weeks    Status  New    Target Date  05/20/19      PT LONG TERM GOAL #3   Title  Pt will improve TUG/TUG cognitive score to </= 10% difference for improved dual tasking.    Time  8    Period  Weeks    Status  New    Target Date  05/20/19            Plan - 04/14/19 1314    Clinical Impression Statement  Skilled session today focused on gait addressing freezing and weight shifting and flexibility.  Pt continues to have episodes of freezing that he reports as being worse at home and when carrying something in bil UE's.  Continue PT per POC.    Personal Factors and Comorbidities  Comorbidity 3+    Comorbidities  PMH includes arthritis,DVT, A-fib, GERD, sleep apnea    Examination-Activity Limitations  Stand;Transfers;Locomotion Level    Examination-Participation Restrictions  Community Activity    Stability/Clinical Decision Making  Evolving/Moderate complexity     Rehab Potential  Good    PT Frequency  2x / week    PT Duration  8 weeks   plus eval   PT Treatment/Interventions  ADLs/Self Care Home Management;DME Instruction;Balance training;Therapeutic exercise;Therapeutic activities;Functional mobility training;Gait training;Neuromuscular re-education;Patient/family education    PT Next Visit Plan  Functional strengthening, stretching, gait and changes of direction; dual tasking with gait.    Consulted and Agree with Plan of Care  Patient       Patient will benefit from skilled therapeutic intervention in order to improve the following deficits and impairments:  Abnormal gait, Decreased balance, Decreased mobility, Decreased strength, Postural dysfunction, Difficulty walking  Visit Diagnosis: Unsteadiness on feet  Other abnormalities of gait and mobility  Other symptoms and signs involving the nervous system     Problem List Patient Active Problem List   Diagnosis Date Noted  . Parkinson's disease ([place]) 02/03/2018  . Community acquired pneumonia of left lower lobe of lung 12/23/2017  . Sepsis ([place]) 12/23/2017  . Morbid obesity due to excess calories ([place]) 08/23/2016  . OSA on CPAP 08/23/2016  . Dyspnea on exertion 08/22/2016  . Umbilical hernia [id number]  . Persistent atrial fibrillation ([place])   . Encounter for therapeutic drug monitoring 08/12/2013  . Long term current use of anticoagulant 07/30/2010  . COLONIC POLYPS 06/03/2010  .  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 GERD 06/03/2010  . HIATAL HERNIA 06/03/2010  . BENIGN PROSTATIC HYPERTROPHY, WITH OBSTRUCTION 06/03/2010  . PSORIASIS 06/03/2010    Narda Bonds, PTA [facility] 04/14/19 1:27 PM Phone: [phone] Fax: [facility] [address] [place], [place], [address] Phone: [phone]   Fax:  [phone]  Name: Stanley Wright MRN: [id number] Date  of Birth: 1945/12/10

## 2019-04-14 NOTE — Therapy (Signed)
[facility] [address] [place], [place], [address] Phone: [phone]   Fax:  [phone]  Occupational Therapy Treatment  Patient Details  Name: Stanley Wright MRN: [id number] Date of Birth: [birth year] Referring Provider (OT): Dr. Carles Collet   Encounter Date: 04/14/2019  OT End of Session - 04/14/19 0805    Visit Number  5    Number of Visits  11    Date for OT Re-Evaluation  05/01/19    Authorization Type  [employer]    Authorization - Visit Number  5    Authorization - Number of Visits  10    OT Start Time  0803    OT Stop Time  0845    OT Time Calculation (min)  42 min    Activity Tolerance  Patient tolerated treatment well    Behavior During Therapy  [facility] for tasks assessed/performed       Past Medical History:  Diagnosis Date  . Arthritis   . BENIGN PROSTATIC HYPERTROPHY, WITH OBSTRUCTION   . Cancer (HCC)    skin, basal, squamous  . COLONIC POLYPS   . Diverticulitis   . DVT (deep venous thrombosis) ([place]) 2000  . Dysrhythmia    Hx Afib- 2017  . Factor 5 Leiden mutation, heterozygous ([place])   . GERD (gastroesophageal reflux disease)    not on medication  . HIATAL HERNIA   . ITP (idiopathic thrombocytopenic purpura) 2003  . Long term current use of anticoagulant   . Parkinson's disease ([place]) 02/03/2018  . PSORIASIS   . Pulmonary embolus ([place]) 2000  . Shortness of breath dyspnea    at times - Talking alot  . Sleep apnea     Past Surgical History:  Procedure Laterality Date  . CARDIOVERSION N/A 11/22/2015   Procedure: CARDIOVERSION;  Surgeon: Sanda Klein, MD;  [facility];  Service: Cardiovascular;  Laterality: N/A;  . COLONOSCOPY    . HERNIA REPAIR Left    Inguinal- x2 . mesh x1  . INSERTION OF MESH N/A 02/25/2016   Procedure: INSERTION OF MESH;  Surgeon: Rolm Bookbinder, MD;  Location: [facility];  Service: General;  Laterality: N/A;  . LUNG REMOVAL, PARTIAL Right 2004   was fungal and  not cancerous  . PROSTATE SURGERY    . UMBILICAL HERNIA REPAIR N/A 02/25/2016   Procedure: LAPAROSCOPIC UMBILICAL HERNIA REPAIR;  Surgeon: Rolm Bookbinder, MD;  Location: [facility];  Service: General;  Laterality: N/A;    There were no vitals filed for this visit.  Subjective Assessment - 04/14/19 0804    Subjective   Pt reports some improvement with seatbelt.  "I got to get my strategy down"    Currently in Pain?  No/denies          PWR! Moves (up, rock, twist) in sitting x 10-20 each with min cues For incr movement amplitude.  Sitting, functional reaching in diagonal pattern overhead to place large pegs in vertical pegboard with each UE for wt. Shift, trunk rotation, and PWR! Reach, set up and min v.c. for large amplitude movements including PWR! Hands and head turns.    Sitting, functional reaching and in-hand manipulation to pick up checkers and place in connect 4 slots with each UE with set-up for trunk rotation/lateral wt. Shift    Reviewed importance for use of large amplitude movement strategies for ADLs to incr ease and decr risk of future complications.  Pt verbalized understanding.  Simulated ADLs with bag with focus/min cues for large  amplitude movements, posture, opening hands:  Donning/doffing pull-over shirt, donning/doffing pants, pulling bag into hand for clothing adjustment/donning socks, pulling shirt down in back/drying back.  Then reaching across body with each UE to touch opposite foot in sitting for stretch and to simulate LB dressing followed by crossing LE across opposite knee for stretch.  Recommended pt perform stretches at home while watching tv to maintain flexibility.  Pt verbalized understanding.  Educated pt on strategies for LB dressing as pt reports that donning socks is difficult including gathering sock in hand, propping foot on footstool/chair, crossing legs, and sitting.  Pt verbalized understanding.       OT Long Term Goals - 04/07/19 1020       OT LONG TERM GOAL #1   Title  I with PD specific HEP.    Time  5    Period  Weeks    Status  New      OT LONG TERM GOAL #2   Title  Pt will demonstrate increased ease with fatening buttons as evidenced by decreasing 3 button/ un button test  to 45 secs or less    Baseline  53.03    Time  5    Period  Weeks    Status  New      OT LONG TERM GOAL #3   Title  Pt will verbalzie understanding of adpated strategies for ADLs/IADLs. and ways to prevent future PD related complications.    Time  5    Period  Weeks    Status  New      OT LONG TERM GOAL #4   Title  Pt will demonstrate ability to write a sentence with 90% legibility and no significant decrease in letter size.    Baseline  50% legible sentence level    Time  5    Period  Weeks    Status  New      OT LONG TERM GOAL #5   Title  Pt will report increased ease with fastening his seatbelt.    Time  5    Period  Weeks    Status  New      OT LONG TERM GOAL #6   Title  Pt will increase bilateral shoulder flexion by 5* for increased ease with IADLs.    Baseline  RUE 125, LUE 120    Time  5    Period  Weeks    Status  New            Plan - 04/14/19 0805    Clinical Impression Statement  Pt is progressing towards goals, he requires verbal cues for timeing and larger amplitude movements.  Pt reports improvements with functional activities    OT Occupational Profile and History  Problem Focused Assessment - Including review of records relating to presenting problem    Occupational performance deficits (Please refer to evaluation for details):  ADL's;IADL's;Social Participation    Body Structure / Function / Physical Skills  ADL;UE functional use;Balance;FMC;Gait;ROM;GMC;Coordination;Decreased knowledge of precautions;Decreased knowledge of use of DME;IADL;Strength;Mobility;Dexterity    Rehab Potential  Good    Clinical Decision Making  Limited treatment options, no task modification necessary    Comorbidities Affecting  Occupational Performance:  May have comorbidities impacting occupational performance    Modification or Assistance to Complete Evaluation   No modification of tasks or assist necessary to complete eval    OT Frequency  2x / week    OT Duration  --   5 weeks plus  eval   OT Treatment/Interventions  Self-care/ADL training;Ultrasound;Energy conservation;Aquatic Therapy;DME and/or AE instruction;Patient/family education;Balance training;Passive range of motion;Paraffin;Gait Training;Fluidtherapy;Cryotherapy;Therapist, nutritional;Therapeutic exercise;Moist Heat;Manual Therapy;Therapeutic activities;Cognitive remediation/compensation;Neuromuscular education    Plan  big movements with functional activity/ADLs    OT Home Exercise Plan  coordination, seated and modifed quadraped PWR(up, rock and twist    Consulted and Agree with Plan of Care  Patient       Patient will benefit from skilled therapeutic intervention in order to improve the following deficits and impairments:   Body Structure / Function / Physical Skills: ADL, UE functional use, Balance, FMC, Gait, ROM, GMC, Coordination, Decreased knowledge of precautions, Decreased knowledge of use of DME, IADL, Strength, Mobility, Dexterity       Visit Diagnosis: Other lack of coordination  Other symptoms and signs involving the nervous system  Other abnormalities of gait and mobility  Unsteadiness on feet    Problem List Patient Active Problem List   Diagnosis Date Noted  . Parkinson's disease ([place]) 02/03/2018  . Community acquired pneumonia of left lower lobe of lung 12/23/2017  . Sepsis ([place]) 12/23/2017  . Morbid obesity due to excess calories ([place]) 08/23/2016  . OSA on CPAP 08/23/2016  . Dyspnea on exertion 08/22/2016  . Umbilical hernia [id number]  . Persistent atrial fibrillation ([place])   . Encounter for therapeutic drug monitoring 08/12/2013  . Long term current use of anticoagulant 07/30/2010  . COLONIC POLYPS  06/03/2010  . 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 GERD 06/03/2010  . HIATAL HERNIA 06/03/2010  . BENIGN PROSTATIC HYPERTROPHY, WITH OBSTRUCTION 06/03/2010  . PSORIASIS 06/03/2010    [facility] 04/14/2019, 8:14 AM  [facility] [address] [place], [place], [address] Phone: [phone]   Fax:  [phone]  Name: WAYNE SIEMON MRN: [id number] Date of Birth: December 13, 1945   Vianne Bulls, OTR/L [facility] [address] [place], [place]  [address] [phone] phone [phone] 04/14/19 10:08 AM

## 2019-04-19 ENCOUNTER — Ambulatory Visit: Payer: PPO

## 2019-04-19 ENCOUNTER — Other Ambulatory Visit: Payer: Self-pay

## 2019-04-19 ENCOUNTER — Encounter: Payer: Self-pay | Admitting: Physical Therapy

## 2019-04-19 ENCOUNTER — Ambulatory Visit: Payer: PPO | Attending: Neurology | Admitting: Occupational Therapy

## 2019-04-19 ENCOUNTER — Ambulatory Visit: Payer: PPO | Admitting: Physical Therapy

## 2019-04-19 DIAGNOSIS — R2681 Unsteadiness on feet: Secondary | ICD-10-CM

## 2019-04-19 DIAGNOSIS — R29818 Other symptoms and signs involving the nervous system: Secondary | ICD-10-CM | POA: Insufficient documentation

## 2019-04-19 DIAGNOSIS — R471 Dysarthria and anarthria: Secondary | ICD-10-CM | POA: Diagnosis not present

## 2019-04-19 DIAGNOSIS — R278 Other lack of coordination: Secondary | ICD-10-CM | POA: Diagnosis not present

## 2019-04-19 DIAGNOSIS — R2689 Other abnormalities of gait and mobility: Secondary | ICD-10-CM | POA: Diagnosis not present

## 2019-04-19 NOTE — Therapy (Signed)
[facility] [address] [place], [place], [address] Phone: [phone]   Fax:  [phone]  Occupational Therapy Treatment  Patient Details  Name: Stanley Wright MRN: [id number] Date of Birth: 1945/09/07 Referring Provider (OT): Dr. Carles Collet   Encounter Date: 04/19/2019  OT End of Session - 04/19/19 1646    Visit Number  6    Number of Visits  11    Date for OT Re-Evaluation  05/01/19    Authorization Type  [employer]    Authorization - Visit Number  6    Authorization - Number of Visits  10    OT Start Time  0850    OT Stop Time  0930    OT Time Calculation (min)  40 min    Activity Tolerance  Patient tolerated treatment well    Behavior During Therapy  [facility] for tasks assessed/performed       Past Medical History:  Diagnosis Date  . Arthritis   . BENIGN PROSTATIC HYPERTROPHY, WITH OBSTRUCTION   . Cancer (HCC)    skin, basal, squamous  . COLONIC POLYPS   . Diverticulitis   . DVT (deep venous thrombosis) ([place]) 2000  . Dysrhythmia    Hx Afib- 2017  . Factor 5 Leiden mutation, heterozygous ([place])   . GERD (gastroesophageal reflux disease)    not on medication  . HIATAL HERNIA   . ITP (idiopathic thrombocytopenic purpura) 2003  . Long term current use of anticoagulant   . Parkinson's disease ([place]) 02/03/2018  . PSORIASIS   . Pulmonary embolus ([place]) 2000  . Shortness of breath dyspnea    at times - Talking alot  . Sleep apnea     Past Surgical History:  Procedure Laterality Date  . CARDIOVERSION N/A 11/22/2015   Procedure: CARDIOVERSION;  Surgeon: Sanda Klein, MD;  [facility];  Service: Cardiovascular;  Laterality: N/A;  . COLONOSCOPY    . HERNIA REPAIR Left    Inguinal- x2 . mesh x1  . INSERTION OF MESH N/A 02/25/2016   Procedure: INSERTION OF MESH;  Surgeon: Rolm Bookbinder, MD;  Location: [facility];  Service: General;  Laterality: N/A;  . LUNG REMOVAL, PARTIAL Right 2004   was fungal and  not cancerous  . PROSTATE SURGERY    . UMBILICAL HERNIA REPAIR N/A 02/25/2016   Procedure: LAPAROSCOPIC UMBILICAL HERNIA REPAIR;  Surgeon: Rolm Bookbinder, MD;  Location: [facility];  Service: General;  Laterality: N/A;    There were no vitals filed for this visit.  Subjective Assessment - 04/19/19 1705    Subjective   Pt reports some improvement with seatbelt.  "I got to get my strategy down"    Currently in Pain?  No/denies                  Treatment: PWR! Up, rock and twist modified quadraped 10-20 reps each, min vc. Bag exercises for simulated ADLs: simulated donning shirt, pulling up socks, donning pants pulling down shirt in back, min v.c 5-10 reps each Flipping and dealing cards with larger amplitude movements, min-mod v.c Arm bike x 5 min level 1 for conditioning, min vc. For speed.              OT Long Term Goals - 04/07/19 1020      OT LONG TERM GOAL #1   Title  I with PD specific HEP.    Time  5    Period  Weeks    Status  New  OT LONG TERM GOAL #2   Title  Pt will demonstrate increased ease with fatening buttons as evidenced by decreasing 3 button/ un button test  to 45 secs or less    Baseline  53.03    Time  5    Period  Weeks    Status  New      OT LONG TERM GOAL #3   Title  Pt will verbalzie understanding of adpated strategies for ADLs/IADLs. and ways to prevent future PD related complications.    Time  5    Period  Weeks    Status  New      OT LONG TERM GOAL #4   Title  Pt will demonstrate ability to write a sentence with 90% legibility and no significant decrease in letter size.    Baseline  50% legible sentence level    Time  5    Period  Weeks    Status  New      OT LONG TERM GOAL #5   Title  Pt will report increased ease with fastening his seatbelt.    Time  5    Period  Weeks    Status  New      OT LONG TERM GOAL #6   Title  Pt will increase bilateral shoulder flexion by 5* for increased ease with IADLs.    Baseline   RUE 125, LUE 120    Time  5    Period  Weeks    Status  New            Plan - 04/19/19 1647    Clinical Impression Statement  Pt is progressing towards goals, he responds well to cues for larger amplitude movements    OT Occupational Profile and History  Problem Focused Assessment - Including review of records relating to presenting problem    Occupational performance deficits (Please refer to evaluation for details):  ADL's;IADL's;Social Participation    Body Structure / Function / Physical Skills  ADL;UE functional use;Balance;FMC;Gait;ROM;GMC;Coordination;Decreased knowledge of precautions;Decreased knowledge of use of DME;IADL;Strength;Mobility;Dexterity    Rehab Potential  Good    Clinical Decision Making  Limited treatment options, no task modification necessary    Comorbidities Affecting Occupational Performance:  May have comorbidities impacting occupational performance    Modification or Assistance to Complete Evaluation   No modification of tasks or assist necessary to complete eval    OT Frequency  2x / week    OT Duration  --   5 weeks plus eval   OT Treatment/Interventions  Self-care/ADL training;Ultrasound;Energy conservation;Aquatic Therapy;DME and/or AE instruction;Patient/family education;Balance training;Passive range of motion;Paraffin;Gait Training;Fluidtherapy;Cryotherapy;Therapist, nutritional;Therapeutic exercise;Moist Heat;Manual Therapy;Therapeutic activities;Cognitive remediation/compensation;Neuromuscular education    Plan  big movements with functional activity/ADLs    OT Home Exercise Plan  coordination, seated and modifed quadraped PWR(up, rock and twist    Consulted and Agree with Plan of Care  Patient       Patient will benefit from skilled therapeutic intervention in order to improve the following deficits and impairments:   Body Structure / Function / Physical Skills: ADL, UE functional use, Balance, FMC, Gait, ROM, GMC, Coordination, Decreased  knowledge of precautions, Decreased knowledge of use of DME, IADL, Strength, Mobility, Dexterity       Visit Diagnosis: Other abnormalities of gait and mobility  Other symptoms and signs involving the nervous system  Other lack of coordination    Problem List Patient Active Problem List   Diagnosis Date Noted  . Parkinson's disease ([place])  02/03/2018  . Community acquired pneumonia of left lower lobe of lung 12/23/2017  . Sepsis ([place]) 12/23/2017  . Morbid obesity due to excess calories ([place]) 08/23/2016  . OSA on CPAP 08/23/2016  . Dyspnea on exertion 08/22/2016  . Umbilical hernia [id number]  . Persistent atrial fibrillation ([place])   . Encounter for therapeutic drug monitoring 08/12/2013  . Long term current use of anticoagulant 07/30/2010  . COLONIC POLYPS 06/03/2010  . 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 GERD 06/03/2010  . HIATAL HERNIA 06/03/2010  . BENIGN PROSTATIC HYPERTROPHY, WITH OBSTRUCTION 06/03/2010  . PSORIASIS 06/03/2010    RINE,KATHRYN 04/19/2019, 5:07 PM  [facility] [address] [place], [place], [address] Phone: [phone]   Fax:  [phone]  Name: Stanley Wright MRN: [id number] Date of Birth: Jul 14, 1945

## 2019-04-19 NOTE — Therapy (Signed)
[facility] [address] [place], [place], [address] Phone: [phone]   Fax:  [phone]  Speech Language Pathology Treatment  Patient Details  Name: Stanley Wright MRN: [id number] Date of Birth: [birth year] Referring Provider (SLP): Alonza Bogus, DO   Encounter Date: 04/19/2019  End of Session - 04/19/19 1244    Visit Number  7    Number of Visits  17    Date for SLP Re-Evaluation  06/22/19    SLP Start Time  40    SLP Stop Time   1100    SLP Time Calculation (min)  40 min    Activity Tolerance  Patient tolerated treatment well       Past Medical History:  Diagnosis Date  . Arthritis   . BENIGN PROSTATIC HYPERTROPHY, WITH OBSTRUCTION   . Cancer (HCC)    skin, basal, squamous  . COLONIC POLYPS   . Diverticulitis   . DVT (deep venous thrombosis) ([place]) 2000  . Dysrhythmia    Hx Afib- 2017  . Factor 5 Leiden mutation, heterozygous ([place])   . GERD (gastroesophageal reflux disease)    not on medication  . HIATAL HERNIA   . ITP (idiopathic thrombocytopenic purpura) 2003  . Long term current use of anticoagulant   . Parkinson's disease ([place]) 02/03/2018  . PSORIASIS   . Pulmonary embolus ([place]) 2000  . Shortness of breath dyspnea    at times - Talking alot  . Sleep apnea     Past Surgical History:  Procedure Laterality Date  . CARDIOVERSION N/A 11/22/2015   Procedure: CARDIOVERSION;  Surgeon: Sanda Klein, MD;  [facility];  Service: Cardiovascular;  Laterality: N/A;  . COLONOSCOPY    . HERNIA REPAIR Left    Inguinal- x2 . mesh x1  . INSERTION OF MESH N/A 02/25/2016   Procedure: INSERTION OF MESH;  Surgeon: Rolm Bookbinder, MD;  Location: [facility];  Service: General;  Laterality: N/A;  . LUNG REMOVAL, PARTIAL Right 2004   was fungal and not cancerous  . PROSTATE SURGERY    . UMBILICAL HERNIA REPAIR N/A 02/25/2016   Procedure: LAPAROSCOPIC UMBILICAL HERNIA REPAIR;  Surgeon: Rolm Bookbinder,  MD;  Location: [facility];  Service: General;  Laterality: N/A;    There were no vitals filed for this visit.  Subjective Assessment - 04/19/19 1051    Subjective  "I used the mic for the teaching. I read scripture without the mic."    Currently in Pain?  No/denies            ADULT SLP TREATMENT - 04/19/19 1054      General Information   Behavior/Cognition  Alert;Cooperative;Pleasant mood      Treatment Provided   Treatment provided  Cognitive-Linquistic      Cognitive-Linquistic Treatment   Treatment focused on  Dysarthria    Skilled Treatment  Pt enters ST room with sub WNL volume (mid 60s dB). Loud /a/ to recalibrate conversational volume averaged upper 80s dB. SLP stressed BID loud /a/ as pt stated he had only done them once/day. Pt read scripture with occasional min-mod A for separating words to eliminate "mumbling" as pt called it. In sentence tasks focusing on louder speech pt maintained WNL volume with occasional min A for extra effort.       Assessment / Recommendations / Plan   Plan  Continue with current plan of care      Progression Toward Goals   Progression toward goals  Progressing toward goals  SLP Short Term Goals - 04/19/19 1246      SLP SHORT TERM GOAL #1   Title  pt will produce loud /a/or "Hey-ah" at average low 90s dB over 4 sessions    Baseline  04-19-19    Time  1    Period  Weeks    Status  On-going      SLP SHORT TERM GOAL #2   Title  pt will generate 18/20 sentences with WNL average volume over 3 sessions    Baseline  04-07-19, 04-14-19;    Time  1    Period  Weeks    Status  On-going      SLP SHORT TERM GOAL #3   Title  pt will use abdominal breathing at rest 85% success over 2 sessions    Time  1    Period  Weeks    Status  Deferred   focus on slowing rate and incr volume     SLP SHORT TERM GOAL #4   Title  In 5 minutes simple conversation pt will achieve average speech volume of low 70s dB over two sessions    Time  1     Period  Weeks    Status  On-going       SLP Long Term Goals - 04/19/19 1247      SLP LONG TERM GOAL #1   Title  Pt will generate loud /a/ with average of low 90s dB over total of 6 sessions    Time  5    Period  Weeks   or 17 sessions, for all LTGs   Status  On-going      SLP LONG TERM GOAL #2   Title  pt will use abdominal breathing 70% of the time in 8 minutes simple-mod complex conversation    Time  5    Period  Weeks    Status  On-going      SLP LONG TERM GOAL #3   Title  In 10 minutes simple-mod complex conversation pt will achieve average speech volume of low 70s dB over four sessions    Time  5    Period  Weeks    Status  On-going       Plan - 04/19/19 1245    Clinical Impression Statement  Pt presents today with 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 Pt cont with more success in reading when he slows his rate. SLP believes pt will cont to benefit from skilled ST targeting incr'd volume and greater usage of abdomoinal breathing in order to improve communicative effectiveness.    Speech Therapy Frequency  2x / week    Duration  --   8 weeks or 17 visits   Treatment/Interventions  SLP instruction and feedback;Compensatory strategies;Patient/family education;Functional tasks;Cueing hierarchy;Multimodal communcation approach;Environmental controls;Aspiration precaution training;Pharyngeal strengthening exercises;Diet toleration management by SLP    Potential to Achieve Goals  Good       Patient will benefit from skilled therapeutic intervention in order to improve the following deficits and impairments:   Dysarthria and anarthria    Problem List Patient Active Problem List   Diagnosis Date Noted  . Parkinson's disease ([name]) 02/03/2018  . Community acquired pneumonia of left lower lobe of lung 12/23/2017  . Sepsis ([place]) 12/23/2017  . Morbid obesity due to excess calories ([place])  08/23/2016  . OSA on CPAP 08/23/2016  . Dyspnea on exertion 08/22/2016  . Umbilical  hernia 02/25/2016  . Persistent atrial fibrillation ([place])   . Encounter for therapeutic drug monitoring 08/12/2013  . Long term current use of anticoagulant 07/30/2010  . COLONIC POLYPS 06/03/2010  . 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 GERD 06/03/2010  . HIATAL HERNIA 06/03/2010  . BENIGN PROSTATIC HYPERTROPHY, WITH OBSTRUCTION 06/03/2010  . PSORIASIS 06/03/2010    [facility], CCC-SLP  04/19/2019, 12:47 PM  [facility] [address] [place], [place], [address] Phone: [phone]   Fax:  [phone]   Name: MAHAMED LUKOSE MRN: [id number] Date of Birth: March 14, 1946

## 2019-04-20 NOTE — Therapy (Signed)
[facility] [address] [place], [place], [address] Phone: [phone]   Fax:  [phone]  Physical Therapy Treatment  Patient Details  Name: Stanley Wright MRN: [id number] Date of Birth: 1946/03/05 Referring Provider (PT): Tat, Wells Guiles   Encounter Date: 04/19/2019  PT End of Session - 04/20/19 0810    Visit Number  7    Number of Visits  17    Date for PT Re-Evaluation  06/23/19    Authorization Type  [employer]-10th visit progress note required    PT Start Time  0935    PT Stop Time  1015    PT Time Calculation (min)  40 min    Activity Tolerance  Patient tolerated treatment well    Behavior During Therapy  [facility] for tasks assessed/performed       Past Medical History:  Diagnosis Date  . Arthritis   . BENIGN PROSTATIC HYPERTROPHY, WITH OBSTRUCTION   . Cancer (HCC)    skin, basal, squamous  . COLONIC POLYPS   . Diverticulitis   . DVT (deep venous thrombosis) ([place]) 2000  . Dysrhythmia    Hx Afib- 2017  . Factor 5 Leiden mutation, heterozygous ([place])   . GERD (gastroesophageal reflux disease)    not on medication  . HIATAL HERNIA   . ITP (idiopathic thrombocytopenic purpura) 2003  . Long term current use of anticoagulant   . Parkinson's disease ([place]) 02/03/2018  . PSORIASIS   . Pulmonary embolus ([place]) 2000  . Shortness of breath dyspnea    at times - Talking alot  . Sleep apnea     Past Surgical History:  Procedure Laterality Date  . CARDIOVERSION N/A 11/22/2015   Procedure: CARDIOVERSION;  Surgeon: Sanda Klein, MD;  [facility];  Service: Cardiovascular;  Laterality: N/A;  . COLONOSCOPY    . HERNIA REPAIR Left    Inguinal- x2 . mesh x1  . INSERTION OF MESH N/A 02/25/2016   Procedure: INSERTION OF MESH;  Surgeon: Rolm Bookbinder, MD;  Location: [facility];  Service: General;  Laterality: N/A;  . LUNG REMOVAL, PARTIAL Right 2004   was fungal and not cancerous  . PROSTATE SURGERY     . UMBILICAL HERNIA REPAIR N/A 02/25/2016   Procedure: LAPAROSCOPIC UMBILICAL HERNIA REPAIR;  Surgeon: Rolm Bookbinder, MD;  Location: [facility];  Service: General;  Laterality: N/A;    There were no vitals filed for this visit.  Subjective Assessment - 04/20/19 0802    Subjective  Got my U-step RW the other day.  Think I'm going to like it.    Pertinent History  Parkinson's disease; CHF    Patient Stated Goals  Pt's goal for PT is to get back to increased movement confidence.    Currently in Pain?  No/denies                       [facility] Adult PT Treatment/Exercise - 04/20/19 0803      Transfers   Transfers  Sit to Stand;Stand to Sit    Sit to Stand  5: Supervision;Without upper extremity assist;From chair/3-in-1    Five time sit to stand comments   27.65   11.40 with UE support   Stand to Sit  5: Supervision;Without upper extremity assist;To chair/3-in-1    Number of Reps  --   Additional sets with UE support x 5 reps, 9.47 sec     Ambulation/Gait   Ambulation/Gait  Yes    Ambulation/Gait  Assistance  5: Supervision    Ambulation/Gait Assistance Details  Pt received a U-step RW for personal use at home; educated pt in use of trial of clinic's U-step RW during session today.    Ambulation Distance (Feet)  400 Feet   x 2 including turns, changes of direction   Assistive device  --   U-step RW   Gait Pattern  Step-through pattern;Decreased step length - right;Decreased step length - left;Decreased dorsiflexion - right;Decreased dorsiflexion - left;Festinating;Shuffle;Decreased trunk rotation;Narrow base of support;Poor foot clearance - left;Poor foot clearance - right;Step-to pattern;Decreased arm swing - left    Gait Comments  Cues for correct height of U-step RW; cues and instructions on use of laser light for consistent step length, audio/metronome for cadence.  Cues for staying close to U-step rollator and to stop/reset when he is hastening/speeding up with gait.   Practiced tight turns, U-turns, cues to use previously practiced turn technique      High Level Balance   High Level Balance Comments  360 degree turn practice:  to R and L, at least 3 reps each direction.  Cues to use either sidestep turn or weightshifting turn, with pt experiencing festination during turns.  Takes >5-7 seconds for turns today.         Therapeutic Activity (cont) In addition to sit<>stand transfers as noted above, practiced folding U-step and simulating placing into backseat of car (by placing on mat surface), x 2 reps; pt able to demo good form with wide BOS and weightshifting, no LOB.    PT Education - 04/20/19 0809    Education Details  Education in use of U-step rolling walker, as pt has gotten one for home    Person(s) Educated  Patient    Methods  Explanation;Demonstration    Comprehension  Verbalized understanding;Returned demonstration;Verbal cues required;Need further instruction       PT Short Term Goals - 04/19/19 0948      PT SHORT TERM GOAL #1   Title  Pt will be independent with HEP for improved balance, transfers, gait.  TARGET 04/22/2019    Time  4    Period  Weeks    Status  New    Target Date  04/22/19      PT SHORT TERM GOAL #2   Title  Pt will improve 5x sit<>stand test to less than or equal to 13 seconds for improved transfer efficiency and safety.    Baseline  with UE support, 11.4 seconds 04/19/2019    Time  4    Period  Weeks    Status  Achieved    Target Date  04/22/19      PT SHORT TERM GOAL #3   Title  The patient will perform 360 degree turn in less than 5 seconds for improved turn efficiency and safety.    Baseline  R 6.13 sec, 8 sec    Time  4    Period  Weeks    Status  Not Met    Target Date  04/22/19      PT SHORT TERM GOAL #4   Title  Pt will verbalize/demo understanding of tips to reduce festination with turns and gait.    Baseline  Pt able to verbalize, but needs cues for full demo.    Time  4    Period  Weeks     Status  Partially Met    Target Date  04/22/19        PT Long Term Goals -  03/25/19 1306      PT LONG TERM GOAL #1   Title  Pt will verbalize/demonstrate understanding of lifting/carrying objects with bilateral UEs, for improved safety with gait.    Time  8    Period  Weeks    Status  New    Target Date  05/20/19      PT LONG TERM GOAL #2   Title  Pt will improve MiniBESTest score to at least 22/28 for decreased fall risk.    Time  8    Period  Weeks    Status  New    Target Date  05/20/19      PT LONG TERM GOAL #3   Title  Pt will improve TUG/TUG cognitive score to </= 10% difference for improved dual tasking.    Time  8    Period  Weeks    Status  New    Target Date  05/20/19            Plan - 04/20/19 0810    Clinical Impression Statement  Pt has gotten U-step RW for home use; so focus of today's session on how pt can effectively use U-step RW to improve stability with gait, lessen festination and hastening episodes.  Pt receptive to feedback and appears to have improved control with U-step RW in clinic trial.  Began assessing STGs, with pt meeting STG 2 for improved sit to stand.  Pt did not met STG 3 for turns; STG partially met for verbalizing tips to decrease freezing and able to improve with cueing, but when on his own without cues, he continues to demo festination/freezing with turns.    Personal Factors and Comorbidities  Comorbidity 3+    Comorbidities  PMH includes arthritis,DVT, A-fib, GERD, sleep apnea    Examination-Activity Limitations  Stand;Transfers;Locomotion Level    Examination-Participation Restrictions  Community Activity    Stability/Clinical Decision Making  Evolving/Moderate complexity    Rehab Potential  Good    PT Frequency  2x / week    PT Duration  8 weeks   plus eval   PT Treatment/Interventions  ADLs/Self Care Home Management;DME Instruction;Balance training;Therapeutic exercise;Therapeutic activities;Functional mobility training;Gait  training;Neuromuscular re-education;Patient/family education    PT Next Visit Plan  Check STG 1 for HEP; practice again/answer any questions for U-step; functional strengthening, stretching; gait and changes of direction, dual tasking with gait.    Consulted and Agree with Plan of Care  Patient       Patient will benefit from skilled therapeutic intervention in order to improve the following deficits and impairments:  Abnormal gait, Decreased balance, Decreased mobility, Decreased strength, Postural dysfunction, Difficulty walking  Visit Diagnosis: Other abnormalities of gait and mobility  Other symptoms and signs involving the nervous system  Unsteadiness on feet     Problem List Patient Active Problem List   Diagnosis Date Noted  . Parkinson's disease ([place]) 02/03/2018  . Community acquired pneumonia of left lower lobe of lung 12/23/2017  . Sepsis ([place]) 12/23/2017  . Morbid obesity due to excess calories ([place]) 08/23/2016  . OSA on CPAP 08/23/2016  . Dyspnea on exertion 08/22/2016  . Umbilical hernia 09/04/2246  . Persistent atrial fibrillation ([place])   . Encounter for therapeutic drug monitoring 08/12/2013  . Long term current use of anticoagulant 07/30/2010  . COLONIC POLYPS 06/03/2010  . 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 GERD 06/03/2010  . HIATAL HERNIA 06/03/2010  . BENIGN PROSTATIC HYPERTROPHY, WITH OBSTRUCTION 06/03/2010  . PSORIASIS 06/03/2010  Arber Wiemers W. 04/20/2019, 8:16 AM Frazier Butt PT  [facility] [address] [place], [place], [address] Phone: [phone]   Fax:  [phone]  Name: QUINTRELL BAZE MRN: [id number] Date of Birth: 1946-04-18

## 2019-04-21 ENCOUNTER — Ambulatory Visit: Payer: PPO | Admitting: Occupational Therapy

## 2019-04-21 ENCOUNTER — Encounter: Payer: Self-pay | Admitting: Physical Therapy

## 2019-04-21 ENCOUNTER — Other Ambulatory Visit: Payer: Self-pay

## 2019-04-21 ENCOUNTER — Ambulatory Visit: Payer: PPO | Admitting: Physical Therapy

## 2019-04-21 ENCOUNTER — Ambulatory Visit: Payer: PPO | Admitting: Speech Pathology

## 2019-04-21 DIAGNOSIS — R2681 Unsteadiness on feet: Secondary | ICD-10-CM

## 2019-04-21 DIAGNOSIS — R278 Other lack of coordination: Secondary | ICD-10-CM

## 2019-04-21 DIAGNOSIS — R471 Dysarthria and anarthria: Secondary | ICD-10-CM

## 2019-04-21 DIAGNOSIS — R2689 Other abnormalities of gait and mobility: Secondary | ICD-10-CM

## 2019-04-21 DIAGNOSIS — R29818 Other symptoms and signs involving the nervous system: Secondary | ICD-10-CM

## 2019-04-21 NOTE — Therapy (Signed)
[facility] [address] [place], [place], [address] Phone: [phone]   Fax:  [phone]  Occupational Therapy Treatment  Patient Details  Name: Stanley Wright MRN: [id number] Date of Birth: 1946/01/08 Referring Provider (OT): Dr. Carles Collet   Encounter Date: 04/21/2019  OT End of Session - 04/21/19 1021    Visit Number  7    Number of Visits  11    Date for OT Re-Evaluation  05/01/19    Authorization Type  [employer]    Authorization - Visit Number  7    Authorization - Number of Visits  10    OT Start Time  1018    OT Stop Time  1100    OT Time Calculation (min)  42 min    Activity Tolerance  Patient tolerated treatment well    Behavior During Therapy  [facility] for tasks assessed/performed       Past Medical History:  Diagnosis Date  . Arthritis   . BENIGN PROSTATIC HYPERTROPHY, WITH OBSTRUCTION   . Cancer (HCC)    skin, basal, squamous  . COLONIC POLYPS   . Diverticulitis   . DVT (deep venous thrombosis) ([place]) 2000  . Dysrhythmia    Hx Afib- 2017  . Factor 5 Leiden mutation, heterozygous ([place])   . GERD (gastroesophageal reflux disease)    not on medication  . HIATAL HERNIA   . ITP (idiopathic thrombocytopenic purpura) 2003  . Long term current use of anticoagulant   . Parkinson's disease ([place]) 02/03/2018  . PSORIASIS   . Pulmonary embolus ([place]) 2000  . Shortness of breath dyspnea    at times - Talking alot  . Sleep apnea     Past Surgical History:  Procedure Laterality Date  . CARDIOVERSION N/A 11/22/2015   Procedure: CARDIOVERSION;  Surgeon: Sanda Klein, MD;  [facility];  Service: Cardiovascular;  Laterality: N/A;  . COLONOSCOPY    . HERNIA REPAIR Left    Inguinal- x2 . mesh x1  . INSERTION OF MESH N/A 02/25/2016   Procedure: INSERTION OF MESH;  Surgeon: Rolm Bookbinder, MD;  Location: [facility];  Service: General;  Laterality: N/A;  . LUNG REMOVAL, PARTIAL Right 2004   was fungal and  not cancerous  . PROSTATE SURGERY    . UMBILICAL HERNIA REPAIR N/A 02/25/2016   Procedure: LAPAROSCOPIC UMBILICAL HERNIA REPAIR;  Surgeon: Rolm Bookbinder, MD;  Location: [facility];  Service: General;  Laterality: N/A;    There were no vitals filed for this visit.  Subjective Assessment - 04/21/19 1020    Subjective   Pt reports his handwriting is a lot better when he slows down and prints.    Currently in Pain?  No/denies               Treatment: Pt was educated regarding Big movements with ADLS and provided with handout. Pt practiced crossing foot across knee for each foot 3 reps for increased ease with donning socks. PWR! Hands 20 reps (PWR! Up and step) Closed chain shoulder flexion in supine x 10 reps min v.c Grooved pegs with right and left UE's min v.c for larger amplitude movements. Handwriting activities with good letter size and legibility, pt met his long term goal.             OT Education - 04/21/19 1033    Education Details  supine PWR! up, rock and twist 10-20 reps each, min v.c    Person(s) Educated  Patient  Methods  Explanation;Demonstration;Verbal cues;Handout    Comprehension  Verbalized understanding;Returned demonstration;Verbal cues required          OT Long Term Goals - 04/21/19 1047      OT LONG TERM GOAL #1   Title  I with PD specific HEP.    Time  5    Period  Weeks    Status  On-going      OT LONG TERM GOAL #2   Title  Pt will demonstrate increased ease with fatening buttons as evidenced by decreasing 3 button/ un button test  to 45 secs or less    Baseline  53.03    Time  5    Period  Weeks    Status  On-going      OT LONG TERM GOAL #3   Title  Pt will verbalzie understanding of adpated strategies for ADLs/IADLs. and ways to prevent future PD related complications.    Time  5    Period  Weeks    Status  On-going      OT LONG TERM GOAL #4   Title  Pt will demonstrate ability to write a sentence with 90% legibility and  no significant decrease in letter size.    Baseline  50% legible sentence level    Time  5    Period  Weeks    Status  Achieved   100% legible with no significant decrease in letter size     OT LONG TERM GOAL #5   Title  Pt will report increased ease with fastening his seatbelt.    Time  5    Period  Weeks    Status  Achieved      OT LONG TERM GOAL #6   Title  Pt will increase bilateral shoulder flexion by 5* for increased ease with IADLs.    Baseline  RUE 125, LUE 120    Time  5    Period  Weeks    Status  On-going            Plan - 04/21/19 1051    Clinical Impression Statement  Pt is progressing towards goals. He demonstrates improved legibility and letter size of handwriting.    OT Occupational Profile and History  Problem Focused Assessment - Including review of records relating to presenting problem    Occupational performance deficits (Please refer to evaluation for details):  ADL's;IADL's;Social Participation    Body Structure / Function / Physical Skills  ADL;UE functional use;Balance;FMC;Gait;ROM;GMC;Coordination;Decreased knowledge of precautions;Decreased knowledge of use of DME;IADL;Strength;Mobility;Dexterity    Rehab Potential  Good    Clinical Decision Making  Limited treatment options, no task modification necessary    Comorbidities Affecting Occupational Performance:  May have comorbidities impacting occupational performance    Modification or Assistance to Complete Evaluation   No modification of tasks or assist necessary to complete eval    OT Frequency  2x / week    OT Duration  --   5 weeks plus eval   OT Treatment/Interventions  Self-care/ADL training;Ultrasound;Energy conservation;Aquatic Therapy;DME and/or AE instruction;Patient/family education;Balance training;Passive range of motion;Paraffin;Gait Training;Fluidtherapy;Cryotherapy;Therapist, nutritional;Therapeutic exercise;Moist Heat;Manual Therapy;Therapeutic activities;Cognitive  remediation/compensation;Neuromuscular education    Plan  big movements with functional activity/ADLs    OT Home Exercise Plan  check progress towards goals, pt requests to continue for several more visits    Consulted and Agree with Plan of Care  Patient       Patient will benefit from skilled therapeutic intervention in order to improve  the following deficits and impairments:   Body Structure / Function / Physical Skills: ADL, UE functional use, Balance, FMC, Gait, ROM, GMC, Coordination, Decreased knowledge of precautions, Decreased knowledge of use of DME, IADL, Strength, Mobility, Dexterity       Visit Diagnosis: Other lack of coordination  Unsteadiness on feet  Other symptoms and signs involving the nervous system    Problem List Patient Active Problem List   Diagnosis Date Noted  . Parkinson's disease ([place]) 02/03/2018  . Community acquired pneumonia of left lower lobe of lung 12/23/2017  . Sepsis ([place]) 12/23/2017  . Morbid obesity due to excess calories ([place]) 08/23/2016  . OSA on CPAP 08/23/2016  . Dyspnea on exertion 08/22/2016  . Umbilical hernia 93/26/7124  . Persistent atrial fibrillation ([place])   . Encounter for therapeutic drug monitoring 08/12/2013  . Long term current use of anticoagulant 07/30/2010  . COLONIC POLYPS 06/03/2010  . 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 GERD 06/03/2010  . HIATAL HERNIA 06/03/2010  . BENIGN PROSTATIC HYPERTROPHY, WITH OBSTRUCTION 06/03/2010  . PSORIASIS 06/03/2010    Muath Hallam 04/21/2019, 2:35 PM  [facility] [address] [place], [place], [address] Phone: [phone]   Fax:  [phone]  Name: JORGELUIS GURGANUS MRN: [id number] Date of Birth: 17-Jan-1946

## 2019-04-21 NOTE — Therapy (Signed)
[facility] [address] [place], [place], [address] Phone: [phone]   Fax:  [phone]  Speech Language Pathology Treatment  Patient Details  Name: Stanley Wright MRN: [id number] Date of Birth: 04-04-1946 Referring Provider (SLP): Alonza Bogus, DO   Encounter Date: 04/21/2019  End of Session - 04/21/19 0934    Visit Number  8    Number of Visits  17    Date for SLP Re-Evaluation  06/22/19    SLP Start Time  0850    SLP Stop Time   0930    SLP Time Calculation (min)  40 min    Activity Tolerance  Patient tolerated treatment well       Past Medical History:  Diagnosis Date  . Arthritis   . BENIGN PROSTATIC HYPERTROPHY, WITH OBSTRUCTION   . Cancer (HCC)    skin, basal, squamous  . COLONIC POLYPS   . Diverticulitis   . DVT (deep venous thrombosis) ([place]) 2000  . Dysrhythmia    Hx Afib- 2017  . Factor 5 Leiden mutation, heterozygous ([place])   . GERD (gastroesophageal reflux disease)    not on medication  . HIATAL HERNIA   . ITP (idiopathic thrombocytopenic purpura) 2003  . Long term current use of anticoagulant   . Parkinson's disease ([place]) 02/03/2018  . PSORIASIS   . Pulmonary embolus ([place]) 2000  . Shortness of breath dyspnea    at times - Talking alot  . Sleep apnea     Past Surgical History:  Procedure Laterality Date  . CARDIOVERSION N/A 11/22/2015   Procedure: CARDIOVERSION;  Surgeon: Sanda Klein, MD;  [facility];  Service: Cardiovascular;  Laterality: N/A;  . COLONOSCOPY    . HERNIA REPAIR Left    Inguinal- x2 . mesh x1  . INSERTION OF MESH N/A 02/25/2016   Procedure: INSERTION OF MESH;  Surgeon: Rolm Bookbinder, MD;  Location: [facility];  Service: General;  Laterality: N/A;  . LUNG REMOVAL, PARTIAL Right 2004   was fungal and not cancerous  . PROSTATE SURGERY    . UMBILICAL HERNIA REPAIR N/A 02/25/2016   Procedure: LAPAROSCOPIC UMBILICAL HERNIA REPAIR;  Surgeon: Rolm Bookbinder,  MD;  Location: [facility];  Service: General;  Laterality: N/A;    There were no vitals filed for this visit.  Subjective Assessment - 04/21/19 0854    Subjective  "It's sort of up and down."    Currently in Pain?  No/denies            ADULT SLP TREATMENT - 04/21/19 0854      General Information   Behavior/Cognition  Alert;Cooperative;Pleasant mood      Treatment Provided   Treatment provided  Cognitive-Linquistic      Pain Assessment   Pain Assessment  No/denies pain      Cognitive-Linquistic Treatment   Treatment focused on  Dysarthria    Skilled Treatment  Pt entered with sub WNL volume: "It's pretty pitiful." Used "Hey-Ah" average upper 80s dB. Scripture reading with occasional min-mod A for short rushes of speech, which increased as task progressed; pt intermittently aware. Structured 1-2 sentence responses (describing Bible characters) with occasional min A for increased volume/effort and to slow rate. Pt retold familiar Bible stories, with occasional mod cues from therapist for short rushes of speech. Improved when therapist/pt took turns telling the story, with SLP modeling slow rate and "chunks" of speech with pauses.      Assessment / Recommendations / Plan   Plan  Continue with current plan of care      Progression Toward Goals   Progression toward goals  Progressing toward goals       SLP Education - 04/21/19 0934    Education Details  more frequent breaths, pauses    Person(s) Educated  Patient    Methods  Explanation    Comprehension  Verbalized understanding       SLP Short Term Goals - 04/21/19 0935      SLP SHORT TERM GOAL #1   Title  pt will produce loud /a/or "Hey-ah" at average low 90s dB over 4 sessions    Baseline  04-19-19    Time  1    Period  Weeks    Status  Partially Met      SLP SHORT TERM GOAL #2   Title  pt will generate 18/20 sentences with WNL average volume over 3 sessions    Baseline  04-07-19, 04-14-19; 04/21/19    Time  1     Period  Weeks    Status  Achieved      SLP SHORT TERM GOAL #3   Title  pt will use abdominal breathing at rest 85% success over 2 sessions    Time  1    Period  Weeks    Status  Deferred   focus on slowing rate and incr volume     SLP SHORT TERM GOAL #4   Title  In 5 minutes simple conversation pt will achieve average speech volume of low 70s dB over two sessions    Time  1    Period  Weeks    Status  Not Met       SLP Long Term Goals - 04/21/19 0935      SLP LONG TERM GOAL #1   Title  Pt will generate loud /a/ with average of low 90s dB over total of 6 sessions    Time  5    Period  Weeks   or 17 sessions, for all LTGs   Status  On-going      SLP LONG TERM GOAL #2   Title  pt will use abdominal breathing 70% of the time in 8 minutes simple-mod complex conversation    Time  5    Period  Weeks    Status  On-going      SLP LONG TERM GOAL #3   Title  In 10 minutes simple-mod complex conversation pt will achieve average speech volume of low 70s dB over four sessions    Time  5    Period  Weeks    Status  On-going       Plan - 04/21/19 0935    Clinical Impression Statement  Pt presents today with 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 Pt cont with more success in reading when he slows his rate. SLP believes pt will cont to benefit from skilled ST targeting incr'd volume and greater usage of abdomoinal breathing in order to improve communicative effectiveness.    Speech Therapy Frequency  2x / week    Duration  --   8 weeks or 17 visits   Treatment/Interventions  SLP instruction and feedback;Compensatory strategies;Patient/family education;Functional tasks;Cueing hierarchy;Multimodal communcation approach;Environmental controls;Aspiration precaution training;Pharyngeal strengthening exercises;Diet toleration management by SLP    Potential to Achieve Goals  Good       Patient will benefit  from skilled therapeutic intervention in order to  improve the following deficits and impairments:   Dysarthria and anarthria    Problem List Patient Active Problem List   Diagnosis Date Noted  . Parkinson's disease ([place]) 02/03/2018  . Community acquired pneumonia of left lower lobe of lung 12/23/2017  . Sepsis ([place]) 12/23/2017  . Morbid obesity due to excess calories ([place]) 08/23/2016  . OSA on CPAP 08/23/2016  . Dyspnea on exertion 08/22/2016  . Umbilical hernia 33/05/5086  . Persistent atrial fibrillation ([place])   . Encounter for therapeutic drug monitoring 08/12/2013  . Long term current use of anticoagulant 07/30/2010  . COLONIC POLYPS 06/03/2010  . 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 GERD 06/03/2010  . HIATAL HERNIA 06/03/2010  . BENIGN PROSTATIC HYPERTROPHY, WITH OBSTRUCTION 06/03/2010  . PSORIASIS 06/03/2010   Deneise Lever, [place], [place] 04/21/2019, 9:36 AM  [facility] [address] [place], [place], [address] Phone: [phone]   Fax:  [phone]   Name: KEONTA ALSIP MRN: [id number] Date of Birth: Sep 20, 1945

## 2019-04-21 NOTE — Patient Instructions (Signed)

## 2019-04-22 NOTE — Therapy (Signed)
[facility] [address] [place], [place], [address] Phone: [phone]   Fax:  [phone]  Physical Therapy Treatment  Patient Details  Name: Stanley Wright MRN: [id number] Date of Birth: [birth year] Referring Provider (PT): Tat, Wells Guiles   Encounter Date: 04/21/2019  PT End of Session - 04/22/19 1444    Visit Number  8    Number of Visits  17    Date for PT Re-Evaluation  06/23/19    Authorization Type  [employer]-10th visit progress note required    PT Start Time  0932    PT Stop Time  1011    PT Time Calculation (min)  39 min    Activity Tolerance  Patient tolerated treatment well    Behavior During Therapy  [facility] for tasks assessed/performed       Past Medical History:  Diagnosis Date  . Arthritis   . BENIGN PROSTATIC HYPERTROPHY, WITH OBSTRUCTION   . Cancer (HCC)    skin, basal, squamous  . COLONIC POLYPS   . Diverticulitis   . DVT (deep venous thrombosis) ([place]) 2000  . Dysrhythmia    Hx Afib- 2017  . Factor 5 Leiden mutation, heterozygous ([place])   . GERD (gastroesophageal reflux disease)    not on medication  . HIATAL HERNIA   . ITP (idiopathic thrombocytopenic purpura) 2003  . Long term current use of anticoagulant   . Parkinson's disease ([place]) 02/03/2018  . PSORIASIS   . Pulmonary embolus ([place]) 2000  . Shortness of breath dyspnea    at times - Talking alot  . Sleep apnea     Past Surgical History:  Procedure Laterality Date  . CARDIOVERSION N/A 11/22/2015   Procedure: CARDIOVERSION;  Surgeon: Sanda Klein, MD;  [facility];  Service: Cardiovascular;  Laterality: N/A;  . COLONOSCOPY    . HERNIA REPAIR Left    Inguinal- x2 . mesh x1  . INSERTION OF MESH N/A 02/25/2016   Procedure: INSERTION OF MESH;  Surgeon: Rolm Bookbinder, MD;  Location: [facility];  Service: General;  Laterality: N/A;  . LUNG REMOVAL, PARTIAL Right 2004   was fungal and not cancerous  . PROSTATE SURGERY     . UMBILICAL HERNIA REPAIR N/A 02/25/2016   Procedure: LAPAROSCOPIC UMBILICAL HERNIA REPAIR;  Surgeon: Rolm Bookbinder, MD;  Location: [facility];  Service: General;  Laterality: N/A;    There were no vitals filed for this visit.  Subjective Assessment - 04/21/19 0934    Subjective  U-Step is still good; have to get a new battery for me.    Pertinent History  Parkinson's disease; CHF    Patient Stated Goals  Pt's goal for PT is to get back to increased movement confidence.    Currently in Pain?  No/denies                       [facility] Adult PT Treatment/Exercise - 04/22/19 1432      Ambulation/Gait   Ambulation/Gait  Yes    Ambulation/Gait Assistance  5: Supervision    Ambulation/Gait Assistance Details  Dual tasking with gait, including conversation tasks and  pt reading text on his phone while walking.    Ambulation Distance (Feet)  200 Feet   x 2, 115 ft x 3   Assistive device  None    Gait Pattern  Step-through pattern;Decreased step length - right;Decreased step length - left;Decreased dorsiflexion - right;Decreased dorsiflexion - left;Festinating;Shuffle;Decreased trunk rotation;Narrow base of support;Poor foot  clearance - left;Poor foot clearance - right;Step-to pattern;Decreased arm swing - left    Ambulation Surface  Level;Indoor    Gait Comments  Overall slowed pace noted with dual tasking with gait; no LOB noted.  Gait while carrying 10#, 15# weights (one in each hand), with consistent step-through pattern while holding weigths to side (once he moves them toward midline, he experiences freezing of gait).  Advised pt to use strategies to keep heavy objects away from midline/line of sight of feet if he needs to carry them.      High Level Balance   High Level Balance Comments  Reviewed rocking turns and 360 turns, with pt demonstrating initial understanding, but needs cues to reinforce slowed pace.  Practiced wide BOS weigthshift with lift x 10 reps with UE support at  counter, progressing to weigthshift and lift for 180 degreee turn, each direction 2 reps.  Practiced quarter turn, side step method, 180 degrees, R and L 2 reps, with cues to slow pace.      Neuro Re-ed    Neuro Re-ed Details   REviewed HEP, including PWR! Moves Standing activities to emphasize weightshifting and large movements:  at counter:  PWR! Up x 10 reps with wide BOS and cues for upright posture, PWR! Rock for [employer] and reaching, then [employer]! Step x 10 reps each side;  pt needs cues to slow pace and increase step length of lateral stepping activity.   Forward step and weightshift x 10 reps, cues for slowed pace and increased foot clearance.  Reiterated education to patient about how each of these exercises can help to offset a freezing episode, and importance of slowed, deliberate movement patterns.        Exercises   Exercises  Knee/Hip;Ankle      Knee/Hip Exercises: Stretches   Gastroc Stretch  Right;Left;2 reps;30 seconds    Gastroc Stretch Limitations  foot propped at 4" step with bilat UE support, cues for looking ahead, not down    Other Knee/Hip Stretches  Runner's stretch 3 reps 15 seconds, with BUE support, cues to look ahead      Knee/Hip Exercises: Standing   Hip Flexion  Both;Knee bent;2 sets;10 reps   Step taps to steps   Forward Lunges  1 set;5 reps;Right;Left   Lunge onto 12" step position   Lateral Step Up  Right;Left;1 set;10 reps;Hand Hold: 2;Step Height: 6"    Forward Step Up  Right;Left;Hand Hold: 2;Step Height: 6";10 reps    Forward Step Up Limitations  Up/up, down/down x 10 reps, each leg leading, cues for full full placement on step      Ankle Exercises: Seated   Toe Raise  15 reps    Toe Raise Limitations  seated ankle pumps -review of HEP               PT Short Term Goals - 04/22/19 1444      PT SHORT TERM GOAL #1   Title  Pt will be independent with HEP for improved balance, transfers, gait.  TARGET 04/22/2019    Time  4     Period  Weeks    Status  Partially Met    Target Date  04/22/19      PT SHORT TERM GOAL #2   Title  Pt will improve 5x sit<>stand test to less than or equal to 13 seconds for improved transfer efficiency and safety.    Baseline  with UE support, 11.4 seconds 04/19/2019    Time  4    Period  Weeks    Status  Achieved    Target Date  04/22/19      PT SHORT TERM GOAL #3   Title  The patient will perform 360 degree turn in less than 5 seconds for improved turn efficiency and safety.    Baseline  R 6.13 sec, 8 sec    Time  4    Period  Weeks    Status  Not Met    Target Date  04/22/19      PT SHORT TERM GOAL #4   Title  Pt will verbalize/demo understanding of tips to reduce festination with turns and gait.    Baseline  Pt able to verbalize, but needs cues for full demo.    Time  4    Period  Weeks    Status  Partially Met    Target Date  04/22/19        PT Long Term Goals - 03/25/19 1306      PT LONG TERM GOAL #1   Title  Pt will verbalize/demonstrate understanding of lifting/carrying objects with bilateral UEs, for improved safety with gait.    Time  8    Period  Weeks    Status  New    Target Date  05/20/19      PT LONG TERM GOAL #2   Title  Pt will improve MiniBESTest score to at least 22/28 for decreased fall risk.    Time  8    Period  Weeks    Status  New    Target Date  05/20/19      PT LONG TERM GOAL #3   Title  Pt will improve TUG/TUG cognitive score to </= 10% difference for improved dual tasking.    Time  8    Period  Weeks    Status  New    Target Date  05/20/19            Plan - 04/22/19 1445    Clinical Impression Statement  Reviewed HEP this visit, in order to assess STG 1.  STG 1 partially met, as pt is able to perform HEP (and appears to be doing so at home), but he needs cues for correct technique and slowed pace.  Pt is making steady progress with PT, but he continues to have festination/freezing epsiodes at times.  He will continue to  beneift from skilled PT to address improved balance and gait.    Personal Factors and Comorbidities  Comorbidity 3+    Comorbidities  PMH includes arthritis,DVT, A-fib, GERD, sleep apnea    Examination-Activity Limitations  Stand;Transfers;Locomotion Level    Examination-Participation Restrictions  Community Activity    Stability/Clinical Decision Making  Evolving/Moderate complexity    Rehab Potential  Good    PT Frequency  2x / week    PT Duration  8 weeks   plus eval   PT Treatment/Interventions  ADLs/Self Care Home Management;DME Instruction;Balance training;Therapeutic exercise;Therapeutic activities;Functional mobility training;Gait training;Neuromuscular re-education;Patient/family education    PT Next Visit Plan  practice again/answer any questions for U-step; functional strengthening, stretching; gait and changes of direction, dual tasking with gait; work towards [place].    Consulted and Agree with Plan of Care  Patient       Patient will benefit from skilled therapeutic intervention in order to improve the following deficits and impairments:  Abnormal gait, Decreased balance, Decreased mobility, Decreased strength, Postural dysfunction, Difficulty walking  Visit Diagnosis: Other abnormalities of gait  and mobility  Unsteadiness on feet     Problem List Patient Active Problem List   Diagnosis Date Noted  . Parkinson's disease ([place]) 02/03/2018  . Community acquired pneumonia of left lower lobe of lung 12/23/2017  . Sepsis ([place]) 12/23/2017  . Morbid obesity due to excess calories ([place]) 08/23/2016  . OSA on CPAP 08/23/2016  . Dyspnea on exertion 08/22/2016  . Umbilical hernia 46/80/3212  . Persistent atrial fibrillation ([place])   . Encounter for therapeutic drug monitoring 08/12/2013  . Long term current use of anticoagulant 07/30/2010  . COLONIC POLYPS 06/03/2010  . 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 GERD 06/03/2010  . HIATAL HERNIA 06/03/2010  . BENIGN PROSTATIC  HYPERTROPHY, WITH OBSTRUCTION 06/03/2010  . PSORIASIS 06/03/2010    MARRIOTT,AMY W. 04/22/2019, 2:48 PM  Frazier Butt., PT   [facility] [address] [place], [place], [address] Phone: [phone]   Fax:  [phone]  Name: EDENILSON AUSTAD MRN: [id number] Date of Birth: 1946/02/16

## 2019-04-26 ENCOUNTER — Ambulatory Visit: Payer: PPO

## 2019-04-26 ENCOUNTER — Ambulatory Visit: Payer: PPO | Admitting: Occupational Therapy

## 2019-04-26 ENCOUNTER — Other Ambulatory Visit: Payer: Self-pay

## 2019-04-26 DIAGNOSIS — R2681 Unsteadiness on feet: Secondary | ICD-10-CM

## 2019-04-26 DIAGNOSIS — R2689 Other abnormalities of gait and mobility: Secondary | ICD-10-CM | POA: Diagnosis not present

## 2019-04-26 DIAGNOSIS — R29818 Other symptoms and signs involving the nervous system: Secondary | ICD-10-CM

## 2019-04-26 DIAGNOSIS — R471 Dysarthria and anarthria: Secondary | ICD-10-CM

## 2019-04-26 DIAGNOSIS — R278 Other lack of coordination: Secondary | ICD-10-CM

## 2019-04-26 NOTE — Therapy (Signed)
[facility] [address] [place], [place], [address] Phone: [phone]   Fax:  [phone]  Speech Language Pathology Treatment  Patient Details  Name: Stanley Wright MRN: [id number] Date of Birth: [birth year] Referring Provider (SLP): Alonza Bogus, DO   Encounter Date: 04/26/2019  End of Session - 04/26/19 0919    Visit Number  9    Number of Visits  17    Date for SLP Re-Evaluation  06/22/19    SLP Start Time  0803    SLP Stop Time   0845    SLP Time Calculation (min)  42 min    Activity Tolerance  Patient tolerated treatment well       Past Medical History:  Diagnosis Date  . Arthritis   . BENIGN PROSTATIC HYPERTROPHY, WITH OBSTRUCTION   . Cancer (HCC)    skin, basal, squamous  . COLONIC POLYPS   . Diverticulitis   . DVT (deep venous thrombosis) ([place]) 2000  . Dysrhythmia    Hx Afib- 2017  . Factor 5 Leiden mutation, heterozygous ([place])   . GERD (gastroesophageal reflux disease)    not on medication  . HIATAL HERNIA   . ITP (idiopathic thrombocytopenic purpura) 2003  . Long term current use of anticoagulant   . Parkinson's disease ([place]) 02/03/2018  . PSORIASIS   . Pulmonary embolus ([place]) 2000  . Shortness of breath dyspnea    at times - Talking alot  . Sleep apnea     Past Surgical History:  Procedure Laterality Date  . CARDIOVERSION N/A 11/22/2015   Procedure: CARDIOVERSION;  Surgeon: Sanda Klein, MD;  [facility];  Service: Cardiovascular;  Laterality: N/A;  . COLONOSCOPY    . HERNIA REPAIR Left    Inguinal- x2 . mesh x1  . INSERTION OF MESH N/A 02/25/2016   Procedure: INSERTION OF MESH;  Surgeon: Rolm Bookbinder, MD;  Location: [facility];  Service: General;  Laterality: N/A;  . LUNG REMOVAL, PARTIAL Right 2004   was fungal and not cancerous  . PROSTATE SURGERY    . UMBILICAL HERNIA REPAIR N/A 02/25/2016   Procedure: LAPAROSCOPIC UMBILICAL HERNIA REPAIR;  Surgeon: Rolm Bookbinder,  MD;  Location: [facility];  Service: General;  Laterality: N/A;    There were no vitals filed for this visit.  Subjective Assessment - 04/26/19 0809    Subjective  Pt cont to deny that his loudness impedes communication. "I just repeat it louder. Their hearing is not as good as it was."    Currently in Pain?  No/denies            ADULT SLP TREATMENT - 04/26/19 0811      General Information   Behavior/Cognition  Alert;Cooperative;Pleasant mood      Treatment Provided   Treatment provided  Cognitive-Linquistic      Cognitive-Linquistic Treatment   Treatment focused on  Dysarthria    Skilled Treatment  Pt entered with near-WNL volume. Pt is consistent with loud Hey-ah practice at home, reportedly. Volume today was upper 80s-low 90s dB.  Pt becoming more accurate at ID when his rate is too fast in reading - however his ability to slow rate of speech was minimal, Pt remarked again he is not concerned about volume at all (see "S"), but primarily about his rate of speech when reading scripture. To a lesser extent, rate when talking with friends impedes communication due to incr'd speed. SLP attempted numerous external strategies today with the most help from putting  hashmarks between each word - pt rate was more consistently slowed rather than starting slow and incr'ing to mumbling after 5-8 words without the hashmarks. Pt wrote "slow" in margins and after each verse with little therapeutic effect - pt agreed this was not an effective strategy. SLP also had pt read with SLP in a slow rate but this was ineffective after 5 words as pt rate incr'd again. without hashmarks, pt req'd usual SLP cues for slowing rate to an "exceedingly slow" speed, which usually resulted in pt speeding up rate after 8-10 words and requiring another SLP to stop pt and cue him to reduce rate.      Assessment / Recommendations / Plan   Plan  Continue with current plan of care      Progression Toward Goals   Progression  toward goals  Progressing toward goals       SLP Education - 04/26/19 0919    Education Details  external cues for reducing rate    Person(s) Educated  Patient    Methods  Explanation;Demonstration;Verbal cues    Comprehension  Verbalized understanding;Returned demonstration;Need further instruction;Verbal cues required       SLP Short Term Goals - 04/26/19 0921      SLP SHORT TERM GOAL #1   Title  pt will produce loud /a/or "Hey-ah" at average low 90s dB over 4 sessions    Baseline  04-19-19    Status  Partially Met      SLP SHORT TERM GOAL #2   Title  pt will generate 18/20 sentences with WNL average volume over 3 sessions    Baseline  04-07-19, 04-14-19; 04/21/19    Status  Achieved      SLP SHORT TERM GOAL #3   Title  pt will use abdominal breathing at rest 85% success over 2 sessions    Status  Deferred   focus on slowing rate and incr volume     SLP SHORT TERM GOAL #4   Title  In 5 minutes simple conversation pt will achieve average speech volume of low 70s dB over two sessions    Status  Deferred   due to pt limited concern over volume      SLP Long Term Goals - 04/26/19 8937      SLP LONG TERM GOAL #1   Title  Pt will generate loud /a/ with average of low 90s dB over total of 6 sessions    Baseline  04-19-19    Time  4    Period  Weeks   or 17 sessions, for all LTGs   Status  On-going      SLP LONG TERM GOAL #2   Title  pt will use abdominal breathing 70% of the time in 8 minutes simple-mod complex conversation    Time  4    Period  Weeks    Status  On-going      SLP LONG TERM GOAL #3   Title  In 10 minutes simple-mod complex conversation pt will achieve average speech volume of low 70s dB over four sessions    Status  Deferred   pt is not concerned about volume      Plan - 04/26/19 0920    Clinical Impression Statement  Pt presents today with 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 Pt cont with more success in reading when he slows his rate, however continues to require usual  mod cues from SLP to do so in reading tasks. Pt is improving in [place] when he is speaking too quicklyi but has little success in slowing his rate without SLP assistance. Most helpful cue today was external cue to place hashmarks between every word. SLP believes pt will cont to benefit from skilled ST targeting incr'd volume and greater usage of abdomoinal breathing in order to improve communicative effectiveness.    Speech Therapy Frequency  2x / week    Duration  --   8 weeks or 17 visits   Treatment/Interventions  SLP instruction and feedback;Compensatory strategies;Patient/family education;Functional tasks;Cueing hierarchy;Multimodal communcation approach;Environmental controls;Aspiration precaution training;Pharyngeal strengthening exercises;Diet toleration management by SLP    Potential to Achieve Goals  Good       Patient will benefit from skilled therapeutic intervention in order to improve the following deficits and impairments:   Dysarthria and anarthria    Problem List Patient Active Problem List   Diagnosis Date Noted  . Parkinson's disease ([place]) 02/03/2018  . Community acquired pneumonia of left lower lobe of lung 12/23/2017  . Sepsis ([place]) 12/23/2017  . Morbid obesity due to excess calories ([place]) 08/23/2016  . OSA on CPAP 08/23/2016  . Dyspnea on exertion 08/22/2016  . Umbilical hernia 40/98/1191  . Persistent atrial fibrillation ([place])   . Encounter for therapeutic drug monitoring 08/12/2013  . Long term current use of anticoagulant 07/30/2010  . COLONIC POLYPS 06/03/2010  . 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 GERD 06/03/2010  . HIATAL HERNIA 06/03/2010  . BENIGN PROSTATIC HYPERTROPHY, WITH OBSTRUCTION 06/03/2010  . PSORIASIS 06/03/2010    [facility] ,[place], [place]  04/26/2019, 9:24 AM  [facility] [address] [place], [place], [address] Phone: [phone]   Fax:  [phone]   Name: JAMELL LAYMON MRN: [id number] Date of Birth: 1946/04/07

## 2019-04-26 NOTE — Therapy (Signed)
[facility] [address] [place], [place], [address] Phone: [phone]   Fax:  [phone]  Occupational Therapy Treatment  Patient Details  Name: Stanley Wright MRN: [id number] Date of Birth: 03/23/1946 Referring Provider (OT): Dr. Carles Collet   Encounter Date: 04/26/2019  OT End of Session - 04/26/19 0849    Visit Number  8    Number of Visits  11    Date for OT Re-Evaluation  05/01/19    Authorization Type  [employer]    Authorization - Visit Number  8    Authorization - Number of Visits  10    OT Start Time  0847    OT Stop Time  0928    OT Time Calculation (min)  41 min    Activity Tolerance  Patient tolerated treatment well    Behavior During Therapy  [facility] for tasks assessed/performed       Past Medical History:  Diagnosis Date  . Arthritis   . BENIGN PROSTATIC HYPERTROPHY, WITH OBSTRUCTION   . Cancer (HCC)    skin, basal, squamous  . COLONIC POLYPS   . Diverticulitis   . DVT (deep venous thrombosis) ([place]) 2000  . Dysrhythmia    Hx Afib- 2017  . Factor 5 Leiden mutation, heterozygous ([place])   . GERD (gastroesophageal reflux disease)    not on medication  . HIATAL HERNIA   . ITP (idiopathic thrombocytopenic purpura) 2003  . Long term current use of anticoagulant   . Parkinson's disease ([place]) 02/03/2018  . PSORIASIS   . Pulmonary embolus ([place]) 2000  . Shortness of breath dyspnea    at times - Talking alot  . Sleep apnea     Past Surgical History:  Procedure Laterality Date  . CARDIOVERSION N/A 11/22/2015   Procedure: CARDIOVERSION;  Surgeon: Sanda Klein, MD;  [facility];  Service: Cardiovascular;  Laterality: N/A;  . COLONOSCOPY    . HERNIA REPAIR Left    Inguinal- x2 . mesh x1  . INSERTION OF MESH N/A 02/25/2016   Procedure: INSERTION OF MESH;  Surgeon: Rolm Bookbinder, MD;  Location: [facility];  Service: General;  Laterality: N/A;  . LUNG REMOVAL, PARTIAL Right 2004   was fungal and  not cancerous  . PROSTATE SURGERY    . UMBILICAL HERNIA REPAIR N/A 02/25/2016   Procedure: LAPAROSCOPIC UMBILICAL HERNIA REPAIR;  Surgeon: Rolm Bookbinder, MD;  Location: [facility];  Service: General;  Laterality: N/A;    There were no vitals filed for this visit.  Subjective Assessment - 04/26/19 0927    Subjective   Pt reports his handwriting is a little better.    Currently in Pain?  Yes    Pain Score  3     Pain Location  Foot    Pain Orientation  Right    Pain Descriptors / Indicators  Aching    Pain Type  Acute pain    Pain Onset  More than a month ago    Pain Frequency  Intermittent    Aggravating Factors   standing on it    Pain Relieving Factors  rest             Treatment: PWR! Moves modified quadraped for [employer]! Up, rock and twist, 20 reps each, min v.c then pt returned demonstration. Reviewed strategy for fastening buttons, pt met long term goal. PWR! Hands basic 4, 10 reps each, min v.c Handwriting activity, min v.c to slow down. Ambulating while performing category generation, min  v.c for dual tasking Typing activity, min v.c to make sure to pick up fingers with big movement instead of dragging across keyboard.                   OT Long Term Goals - 04/26/19 0901      OT LONG TERM GOAL #1   Title  I with PD specific HEP.    Time  5    Period  Weeks    Status  On-going   reinforce coordination HEP, pt returned demo of PWR! modified quadraped.     OT LONG TERM GOAL #2   Title  Pt will demonstrate increased ease with fatening buttons as evidenced by decreasing 3 button/ un button test  to 45 secs or less    Baseline  53.03    Time  5    Period  Weeks    Status  Achieved   42.15, 34.53     OT LONG TERM GOAL #3   Title  Pt will verbalzie understanding of adpated strategies for ADLs/IADLs. and ways to prevent future PD related complications.    Time  5    Period  Weeks    Status  On-going   needs reinforcement     OT LONG TERM GOAL #4    Title  Pt will demonstrate ability to write a sentence with 90% legibility and no significant decrease in letter size.    Baseline  50% legible sentence level    Time  5    Period  Weeks    Status  Achieved   100% legible with no significant decrease in letter size     OT LONG TERM GOAL #5   Title  Pt will report increased ease with fastening his seatbelt.    Time  5    Period  Weeks    Status  Achieved      OT LONG TERM GOAL #6   Title  Pt will increase bilateral shoulder flexion by 5* for increased ease with IADLs.    Baseline  RUE 125, LUE 120    Time  5    Period  Weeks    Status  On-going            Plan - 04/26/19 4627    Clinical Impression Statement  Pt is progressing towards goals. He demonstrates improving carryover of big movement strategies.    OT Occupational Profile and History  Problem Focused Assessment - Including review of records relating to presenting problem    Occupational performance deficits (Please refer to evaluation for details):  ADL's;IADL's;Social Participation    Body Structure / Function / Physical Skills  ADL;UE functional use;Balance;FMC;Gait;ROM;GMC;Coordination;Decreased knowledge of precautions;Decreased knowledge of use of DME;IADL;Strength;Mobility;Dexterity    Rehab Potential  Good    Clinical Decision Making  Limited treatment options, no task modification necessary    Comorbidities Affecting Occupational Performance:  May have comorbidities impacting occupational performance    Modification or Assistance to Complete Evaluation   No modification of tasks or assist necessary to complete eval    OT Frequency  2x / week    OT Duration  --   5 weeks plus eval   OT Treatment/Interventions  Self-care/ADL training;Ultrasound;Energy conservation;Aquatic Therapy;DME and/or AE instruction;Patient/family education;Balance training;Passive range of motion;Paraffin;Gait Training;Fluidtherapy;Cryotherapy;Therapist, nutritional;Therapeutic  exercise;Moist Heat;Manual Therapy;Therapeutic activities;Cognitive remediation/compensation;Neuromuscular education    Plan  renew next visit, continue big movements with functional activity/ADLs, reinforce HEP.    OT Home Exercise Plan  check  progress towards goals, pt requests to continue for several more visits    Consulted and Agree with Plan of Care  Patient       Patient will benefit from skilled therapeutic intervention in order to improve the following deficits and impairments:   Body Structure / Function / Physical Skills: ADL, UE functional use, Balance, FMC, Gait, ROM, GMC, Coordination, Decreased knowledge of precautions, Decreased knowledge of use of DME, IADL, Strength, Mobility, Dexterity       Visit Diagnosis: Unsteadiness on feet  Other lack of coordination  Other symptoms and signs involving the nervous system  Other abnormalities of gait and mobility    Problem List Patient Active Problem List   Diagnosis Date Noted  . Parkinson's disease ([place]) 02/03/2018  . Community acquired pneumonia of left lower lobe of lung 12/23/2017  . Sepsis ([place]) 12/23/2017  . Morbid obesity due to excess calories ([place]) 08/23/2016  . OSA on CPAP 08/23/2016  . Dyspnea on exertion 08/22/2016  . Umbilical hernia 94/17/4081  . Persistent atrial fibrillation ([place] [place])   . Encounter for therapeutic drug monitoring 08/12/2013  . Long term current use of anticoagulant 07/30/2010  . COLONIC POLYPS 06/03/2010  . 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 GERD 06/03/2010  . HIATAL HERNIA 06/03/2010  . BENIGN PROSTATIC HYPERTROPHY, WITH OBSTRUCTION 06/03/2010  . PSORIASIS 06/03/2010    RINE,KATHRYN 04/26/2019, 12:41 PM  [facility] [address] [place], [place], [address] Phone: [phone]   Fax:  [phone]  Name: Stanley Wright MRN: [id number] Date of Birth: [birth year]

## 2019-04-26 NOTE — Patient Instructions (Signed)
You need to read EXCEEDINGLY SLOWLY for your rate to persist as a normal rate and not ultimately speed up.

## 2019-04-28 ENCOUNTER — Other Ambulatory Visit: Payer: Self-pay

## 2019-04-28 ENCOUNTER — Ambulatory Visit: Payer: PPO

## 2019-04-28 DIAGNOSIS — R2689 Other abnormalities of gait and mobility: Secondary | ICD-10-CM | POA: Diagnosis not present

## 2019-04-28 DIAGNOSIS — R471 Dysarthria and anarthria: Secondary | ICD-10-CM

## 2019-04-28 NOTE — Therapy (Signed)
[facility] [address] [place], [place], [address] Phone: [phone]   Fax:  [phone]  Speech Language Pathology Treatment  Patient Details  Name: Stanley Wright MRN: [id number] Date of Birth: [birth year] Referring Provider (SLP): Alonza Bogus, DO   Encounter Date: 04/28/2019  End of Session - 04/28/19 1129    Visit Number  10    Number of Visits  17    Date for SLP Re-Evaluation  06/22/19    SLP Start Time  0845    SLP Stop Time   0930    SLP Time Calculation (min)  45 min    Activity Tolerance  Patient tolerated treatment well       Past Medical History:  Diagnosis Date  . Arthritis   . BENIGN PROSTATIC HYPERTROPHY, WITH OBSTRUCTION   . Cancer (HCC)    skin, basal, squamous  . COLONIC POLYPS   . Diverticulitis   . DVT (deep venous thrombosis) ([facility]) 2000  . Dysrhythmia    Hx Afib- 2017  . Factor 5 Leiden mutation, heterozygous ([place])   . GERD (gastroesophageal reflux disease)    not on medication  . HIATAL HERNIA   . ITP (idiopathic thrombocytopenic purpura) 2003  . Long term current use of anticoagulant   . Parkinson's disease ([place]) 02/03/2018  . PSORIASIS   . Pulmonary embolus ([place]) 2000  . Shortness of breath dyspnea    at times - Talking alot  . Sleep apnea     Past Surgical History:  Procedure Laterality Date  . CARDIOVERSION N/A 11/22/2015   Procedure: CARDIOVERSION;  Surgeon: Sanda Klein, MD;  [facility];  Service: Cardiovascular;  Laterality: N/A;  . COLONOSCOPY    . HERNIA REPAIR Left    Inguinal- x2 . mesh x1  . INSERTION OF MESH N/A 02/25/2016   Procedure: INSERTION OF MESH;  Surgeon: Rolm Bookbinder, MD;  Location: [facility];  Service: General;  Laterality: N/A;  . LUNG REMOVAL, PARTIAL Right 2004   was fungal and not cancerous  . PROSTATE SURGERY    . UMBILICAL HERNIA REPAIR N/A 02/25/2016   Procedure: LAPAROSCOPIC UMBILICAL HERNIA REPAIR;  Surgeon: Rolm Bookbinder, MD;  Location: [facility];  Service: General;  Laterality: N/A;    There were no vitals filed for this visit.  Subjective Assessment - 04/28/19 0912    Subjective  Pt brings in his scripture for Sunday.    Currently in Pain?  Yes    Pain Score  3     Pain Location  Foot    Pain Orientation  Right    Pain Descriptors / Indicators  Aching    Pain Type  Acute pain    Pain Onset  More than a month ago    Pain Frequency  Intermittent            ADULT SLP TREATMENT - 04/28/19 0913      General Information   Behavior/Cognition  Alert;Cooperative;Pleasant mood      Treatment Provided   Treatment provided  Cognitive-Linquistic      Cognitive-Linquistic Treatment   Treatment focused on  Dysarthria    Skilled Treatment  Pt entered with near-WNL volume. Pt is consistent with loud Hey-ah practice at home, reportedly, and today volume was in low 90s dB.  Pt demonstrated a greater accuracy today than previous session for when his rate is too fast in reading, in structured tasks, OR in conversation - pt's ability to slow rate of speech was  slightly better today but still challenging. Pt stated he was more frustrated or upset about his fast rate due to becoming more aware of it. SLP worked with pt today on "giving every word it's worth" and reducing rate. Very challenging to make changes to slow rate.      Assessment / Recommendations / Plan   Plan  Continue with current plan of care      Progression Toward Goals   Progression toward goals  Not progressing toward goals (comment)   severity of deficit, howver pt awareness better        SLP Short Term Goals - 04/26/19 0921      SLP SHORT TERM GOAL #1   Title  pt will produce loud /a/or "Hey-ah" at average low 90s dB over 4 sessions    Baseline  04-19-19    Status  Partially Met      SLP SHORT TERM GOAL #2   Title  pt will generate 18/20 sentences with WNL average volume over 3 sessions    Baseline  04-07-19, 04-14-19; 04/21/19     Status  Achieved      SLP SHORT TERM GOAL #3   Title  pt will use abdominal breathing at rest 85% success over 2 sessions    Status  Deferred   focus on slowing rate and incr volume     SLP SHORT TERM GOAL #4   Title  In 5 minutes simple conversation pt will achieve average speech volume of low 70s dB over two sessions    Status  Deferred   due to pt limited concern over volume      SLP Long Term Goals - 04/28/19 1436      SLP LONG TERM GOAL #1   Title  Pt will generate loud /a/ with average of low 90s dB over total of 6 sessions    Baseline  04-19-19, 04-28-19    Time  4    Period  Weeks   or 17 sessions, for all LTGs   Status  On-going      SLP LONG TERM GOAL #2   Title  pt will use abdominal breathing 70% of the time in 8 minutes simple-mod complex conversation    Time  4    Period  Weeks    Status  On-going      SLP LONG TERM GOAL #3   Title  In 10 minutes simple-mod complex conversation pt will achieve average speech volume of low 70s dB over four sessions    Status  Deferred   pt is not concerned about volume      Plan - 04/28/19 1130    Clinical Impression Statement  Pt presents today with 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 Pt cont with more success in reading when he spontandously slows his rate. Pt is improving in ID'ing fast rate of speech in both reading and less so in conversation, however has little success with actually slowing rate without SLP assistance. SLP believes pt will cont to benefit from skilled ST targeting incr'd volume and greater usage of abdomoinal breathing in order to improve communicative effectiveness.    Speech Therapy Frequency  2x / week    Duration  --   8 weeks or 17 visits   Treatment/Interventions  SLP instruction and feedback;Compensatory strategies;Patient/family education;Functional tasks;Cueing hierarchy;Multimodal communcation  approach;Environmental controls;Aspiration precaution training;Pharyngeal strengthening exercises;Diet toleration management by SLP  Potential to Achieve Goals  Good       Patient will benefit from skilled therapeutic intervention in order to improve the following deficits and impairments:   Dysarthria and anarthria   Speech Therapy Progress Note  Dates of Reporting Period: 04-05-19 to present  Subjective Statement: Pt has been seen for 10 ST visits focusing mainly on pt's desired goal of slowing his rate with friends. Pt has voiced little concern over the volume of his speech - states he compensates well enough.  Objective Measurements: Pt has slowed his rate with reading tasks with external cues of putting hash marks between the words, with extra practice. However, carryover has been challenging to transfer into structured spontaneous speech tasks. No carryover has been seen to date in conversation.  Goal Update: SEe above   Plan: Pt will cont to be seen for skilled ST.  Reason Skilled Services are Required: SLP does not believe pt has yet reached max potential.    Problem List Patient Active Problem List   Diagnosis Date Noted  . Parkinson's disease ([place]) 02/03/2018  . Community acquired pneumonia of left lower lobe of lung 12/23/2017  . Sepsis ([place]) 12/23/2017  . Morbid obesity due to excess calories ([place]) 08/23/2016  . OSA on CPAP 08/23/2016  . Dyspnea on exertion 08/22/2016  . Umbilical hernia 16/03/9603  . Persistent atrial fibrillation ([place])   . Encounter for therapeutic drug monitoring 08/12/2013  . Long term current use of anticoagulant 07/30/2010  . COLONIC POLYPS 06/03/2010  . 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 GERD 06/03/2010  . HIATAL HERNIA 06/03/2010  . BENIGN PROSTATIC HYPERTROPHY, WITH OBSTRUCTION 06/03/2010  . PSORIASIS 06/03/2010    [facility] ,[place], CCC-SLP  04/28/2019, 2:37 PM  [facility] [address] [place], [place], [address] Phone: [phone]   Fax:  [phone]   Name: Stanley Wright MRN: [id number] Date of Birth: [birth year]

## 2019-04-28 NOTE — Patient Instructions (Signed)
  Please complete the assigned speech therapy homework prior to your next session and return it to the speech therapist at your next visit.  

## 2019-04-29 ENCOUNTER — Other Ambulatory Visit: Payer: Self-pay | Admitting: Cardiology

## 2019-04-29 NOTE — Progress Notes (Signed)
Stanley Wright was seen today in follow up for Parkinsonism.  He has increased his levodopa since our last visit to 1 tablet 4-5 times per day, in addition to his carbidopa/levodopa 50/200 at bedtime.  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  He asks about that today.  Some days he thinks that he does well with the eyes and other days, he thinks that it is not worth it.  He asks me if we can try it one more time.   Pt had one fall and that was yesterday.  His son was in the field and they were clearing an area for fruit trees and they both actually fell but neither got hurt.  Pt denies lightheadedness, near syncope.  No hallucinations.  Mood has been good.  The carbidopa/levodopa CR at bed is controlling the RLS.  He is not doing exercise.  States that he has a painful bone spur in his foot.  Seeing ortho.   Medical records have been reviewed since our last visit.  He has been to physical, occupational, speech therapy.  Pt stated that he paid online to have a consult and he "recommended some tests."  Pt states that "it made sense to me."  He recommended a $31,000 "cure camp."  Pt asks about being tested for parasites.  No weight loss.  No f/c.  He is doing pt/ot/st at the neurorehab center.    Current prescribed movement disorder medications: Carbidopa/levodopa 25/100, 1 tablet 4  times per day Carbidopa/levodopa 50/200 at bedtime   PREVIOUS MEDICATIONS: Botox attempted for eyelid opening apraxia and patient felt it only lasted for 3 days  ALLERGIES:   Allergies  Allergen Reactions  . Tylenol [Acetaminophen] Other (See Comments)    Pt states he had a DNA test completed that stated he should never take tylenol.    CURRENT MEDICATIONS:  Outpatient Encounter Medications as of 05/02/2019  Medication Sig  . carbidopa-levodopa (SINEMET CR) 50-200 MG tablet Take 1 tablet by mouth at bedtime.  .  carbidopa-levodopa (SINEMET IR) 25-100 MG tablet 1 tablet 4-5 times per day  . diltiazem (CARDIZEM CD) 300 MG 24 hr capsule Take 1 capsule (300 mg total) by mouth daily.  Marland Kitchen warfarin (COUMADIN) 5 MG tablet TAKE 1 TABLET BY MOUTH ONCE DAILY OR  AS  DIRECTED  BY  COUMADIN  CLINIC  . furosemide (LASIX) 20 MG tablet Take 2 tablets (40 mg total) by mouth daily.   No facility-administered encounter medications on file as of 05/02/2019.     PAST MEDICAL HISTORY:   Past Medical History:  Diagnosis Date  . Arthritis   . BENIGN PROSTATIC HYPERTROPHY, WITH OBSTRUCTION   . Cancer (HCC)    skin, basal, squamous  . COLONIC POLYPS   . Diverticulitis   . DVT (deep venous thrombosis) ([place]) 2000  . Dysrhythmia    Hx Afib- 2017  . Factor 5 Leiden mutation, heterozygous ([place])   . GERD (gastroesophageal reflux disease)    not on medication  . HIATAL HERNIA   . ITP (idiopathic thrombocytopenic purpura) 2003  . Long term current use of anticoagulant   . Parkinson's disease ([place]) 02/03/2018  . PSORIASIS   . Pulmonary embolus ([place]) 2000  . Shortness of breath dyspnea    at times - Talking alot  . Sleep apnea     PAST SURGICAL HISTORY:   Past  Surgical History:  Procedure Laterality Date  . CARDIOVERSION N/A 11/22/2015   Procedure: CARDIOVERSION;  Surgeon: Sanda Klein, MD;  Location: [facility];  Service: Cardiovascular;  Laterality: N/A;  . COLONOSCOPY    . HERNIA REPAIR Left    Inguinal- x2 . mesh x1  . INSERTION OF MESH N/A 02/25/2016   Procedure: INSERTION OF MESH;  Surgeon: Rolm Bookbinder, MD;  Location: [facility];  Service: General;  Laterality: N/A;  . LUNG REMOVAL, PARTIAL Right 2004   was fungal and not cancerous  . PROSTATE SURGERY    . UMBILICAL HERNIA REPAIR N/A 02/25/2016   Procedure: LAPAROSCOPIC UMBILICAL HERNIA REPAIR;  Surgeon: Rolm Bookbinder, MD;  Location: [facility];  Service: General;  Laterality: N/A;    SOCIAL HISTORY:   Social History   Socioeconomic History  . Marital  status: Divorced    Spouse name: Not on file  . Number of children: 2  . Years of education: Masters  . Highest education level: Not on file  Occupational History    Employer: RETIRED    Comment: police officer  Social Needs  . Financial resource strain: Not on file  . Food insecurity    Worry: Not on file    Inability: Not on file  . Transportation needs    Medical: Not on file    Non-medical: Not on file  Tobacco Use  . Smoking status: Former Smoker    Packs/day: 1.00    Quit date: 06/16/1985    Years since quitting: 33.8  . Smokeless tobacco: Never Used  . Tobacco comment: quit approx age 55-   Substance and Sexual Activity  . Alcohol use: No  . Drug use: No  . Sexual activity: Yes  Lifestyle  . Physical activity    Days per week: Not on file    Minutes per session: Not on file  . Stress: Not on file  Relationships  . Social Herbalist on phone: Not on file    Gets together: Not on file    Attends religious service: Not on file    Active member of club or organization: Not on file    Attends meetings of clubs or organizations: Not on file    Relationship status: Not on file  . Intimate partner violence    Fear of current or ex partner: Not on file    Emotionally abused: Not on file    Physically abused: Not on file    Forced sexual activity: Not on file  Other Topics Concern  . Not on file  Social History Narrative   Lives alone   Caffeine use: Coffee daily   Right handed     FAMILY HISTORY:   Family Status  Relation Name Status  . Mother  Deceased  . Father  Deceased  . Sister  Alive  . Brother  Deceased  . Daughter  Alive  . Son  Alive  . MGM  Deceased  . MGF  Deceased  . PGM  Deceased  . PGF  Deceased    ROS:  Review of Systems  Constitutional: Negative.   HENT: Negative.   Eyes: Negative.   Respiratory: Negative.   Cardiovascular: Negative.   Gastrointestinal: Negative.   Genitourinary: Negative.   Musculoskeletal: Negative.    Skin: Negative.     PHYSICAL EXAMINATION:    VITALS:   Vitals:   05/02/19 0753  BP: 120/82  Resp: 18  Temp: 98.2 F (36.8 C)  Weight: 274 lb 8  oz (124.5 kg)  Height: 6' (1.829 m)    GEN:  The patient appears stated age and is in NAD. HEENT:  Normocephalic, atraumatic.  The mucous membranes are moist. The superficial temporal arteries are without ropiness or tenderness. CV:  RRR Lungs:  CTAB Neck/HEME:  There are no carotid bruits bilaterally.  Head/neck is turned to the left  Neurological examination:  Orientation: The patient is alert and oriented x3. Cranial nerves: There is good facial symmetry with facial hypomimia. The speech is fluent and hypophonic and dysarthric. Soft palate rises symmetrically and there is no tongue deviation. Hearing is intact to conversational tone. Sensation: Sensation is intact to light touch throughout Motor: Strength is at least antigravity x4.  Movement examination: Tone: There is normal tone in the ue/le Abnormal movements: none Coordination:  There is mild decremation with RAM's, with finger taps and toe taps bilaterally Gait and Station: The patient has no difficulty arising out of a deep-seated chair without the use of the hands.  There is mild start hesitation.  The patient's stride length is good once out in the hall.     ASSESSMENT/PLAN:  1.Parkinsonism -The patienthad a DaTscan in February, 2020 and there was near absent dopamine in the bilateral putamen. Explained to patient that this is not a diagnostic or therapeutic scan. Explained that one can lose dopamine many years prior to meeting clinical criteria for a specific diagnosis. I had previously taken him off of his levodopa for over 36 hours and he did not look particularly parkinsonian. 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 Given this in combination with restless leg,MSA is certainly in the differential. He and I discussed this today. We also discussed that we would need time to see if this is correct.  May do on/off test in the future. -For now, he will continue carbidopa/levodopa 25/100, 1 tablet 4 times per day.   -He will continue carbidopa/levodopa 50/200 at bedtime for restless leg.             -pt saw a naturopath online who recommend all sorts of un-necessary tests for parasites and a $31,000 camp for a cure.  I told the patient that this was a scam.    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 He and I discussed the value of Botox. Wants to hold on that and states that his chiropractor is working on that.  3.  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  Pt wants to repeat. He has an appt on 12/11  4.dysphagia -mild. Doesn't want to do MBE at this time  5.  Follow up is anticipated in the next 4-6 months, sooner should new neurologic issues arise.  Much greater than 50% of this visit was spent in counseling and coordinating care.  Total face to face time:  25 min  Cc:  Scifres, Earlie Server, [employer]

## 2019-05-02 ENCOUNTER — Other Ambulatory Visit: Payer: Self-pay

## 2019-05-02 ENCOUNTER — Encounter: Payer: Self-pay | Admitting: Neurology

## 2019-05-02 ENCOUNTER — Ambulatory Visit: Payer: PPO | Admitting: Neurology

## 2019-05-02 VITALS — BP 120/82 | Temp 98.2°F | Resp 18 | Ht 72.0 in | Wt 274.5 lb

## 2019-05-02 DIAGNOSIS — G243 Spasmodic torticollis: Secondary | ICD-10-CM | POA: Diagnosis not present

## 2019-05-02 DIAGNOSIS — G20 Parkinson's disease: Secondary | ICD-10-CM | POA: Diagnosis not present

## 2019-05-02 NOTE — Patient Instructions (Signed)
No changes in medication!  The physicians and staff at [facility] are committed to providing excellent care. You may receive a survey requesting feedback about your experience at our office. We strive to receive "very good" responses to the survey questions. If you feel that your experience would prevent you from giving the office a "very good " response, please contact our office to try to remedy the situation. We may be reached at [phone]. Thank you for taking the time out of your busy day to complete the survey.  

## 2019-05-03 ENCOUNTER — Ambulatory Visit: Payer: PPO | Admitting: Physical Therapy

## 2019-05-03 ENCOUNTER — Ambulatory Visit: Payer: PPO

## 2019-05-03 ENCOUNTER — Encounter: Payer: Self-pay | Admitting: Physical Therapy

## 2019-05-03 DIAGNOSIS — R471 Dysarthria and anarthria: Secondary | ICD-10-CM

## 2019-05-03 DIAGNOSIS — R2689 Other abnormalities of gait and mobility: Secondary | ICD-10-CM

## 2019-05-03 DIAGNOSIS — R2681 Unsteadiness on feet: Secondary | ICD-10-CM

## 2019-05-03 DIAGNOSIS — R29818 Other symptoms and signs involving the nervous system: Secondary | ICD-10-CM

## 2019-05-03 NOTE — Therapy (Signed)
[facility] [address] [place], [place], [address] Phone: [phone]   Fax:  [phone]  Speech Language Pathology Treatment  Patient Details  Name: Stanley Wright MRN: [id number] Date of Birth: [birth year] Referring Provider (SLP): Alonza Bogus, DO   Encounter Date: 05/03/2019  End of Session - 05/03/19 1003    Visit Number  11    Number of Visits  17    Date for SLP Re-Evaluation  06/22/19    SLP Start Time  0804    SLP Stop Time   0845    SLP Time Calculation (min)  41 min    Activity Tolerance  Patient tolerated treatment well       Past Medical History:  Diagnosis Date  . Arthritis   . BENIGN PROSTATIC HYPERTROPHY, WITH OBSTRUCTION   . Cancer (HCC)    skin, basal, squamous  . COLONIC POLYPS   . Diverticulitis   . DVT (deep venous thrombosis) ([employer]) 2000  . Dysrhythmia    Hx Afib- 2017  . Factor 5 Leiden mutation, heterozygous ([place])   . GERD (gastroesophageal reflux disease)    not on medication  . HIATAL HERNIA   . ITP (idiopathic thrombocytopenic purpura) 2003  . Long term current use of anticoagulant   . Parkinson's disease ([place]) 02/03/2018  . PSORIASIS   . Pulmonary embolus ([place]) 2000  . Shortness of breath dyspnea    at times - Talking alot  . Sleep apnea     Past Surgical History:  Procedure Laterality Date  . CARDIOVERSION N/A 11/22/2015   Procedure: CARDIOVERSION;  Surgeon: Sanda Klein, MD;  [facility];  Service: Cardiovascular;  Laterality: N/A;  . COLONOSCOPY    . HERNIA REPAIR Left    Inguinal- x2 . mesh x1  . INSERTION OF MESH N/A 02/25/2016   Procedure: INSERTION OF MESH;  Surgeon: Rolm Bookbinder, MD;  Location: [facility];  Service: General;  Laterality: N/A;  . LUNG REMOVAL, PARTIAL Right 2004   was fungal and not cancerous  . PROSTATE SURGERY    . UMBILICAL HERNIA REPAIR N/A 02/25/2016   Procedure: LAPAROSCOPIC UMBILICAL HERNIA REPAIR;  Surgeon: Rolm Bookbinder, MD;  Location: [facility];  Service: General;  Laterality: N/A;    There were no vitals filed for this visit.  Subjective Assessment - 05/03/19 0846    Subjective  Pt brings in his scripture for Sunday. "It went fair." (re: scripture reading last Sunday)    Currently in Pain?  No/denies            ADULT SLP TREATMENT - 05/03/19 0847      General Information   Behavior/Cognition  Alert;Cooperative;Pleasant mood      Treatment Provided   Treatment provided  Cognitive-Linquistic      Cognitive-Linquistic Treatment   Treatment focused on  Dysarthria    Skilled Treatment  Pt entered with near-WNL volume. Pt remains consistent with loud Hey-ah practice at home, reportedly, and today volume was again in low 90s dB.  Pt ID of fast rate was commensurate with previous session. His ability to slow rate of speech was like last week and pt is beginning to make some small changes to ability to slow his rate consistently, but it remains challenging. SLP cued pt again with "giving every word it's worth" and reducing rate.      Assessment / Recommendations / Plan   Plan  Continue with current plan of care      Progression Toward  Goals   Progression toward goals  Progressing toward goals   severity of deficit        SLP Short Term Goals - 04/26/19 0921      SLP SHORT TERM GOAL #1   Title  pt will produce loud /a/or "Hey-ah" at average low 90s dB over 4 sessions    Baseline  04-19-19    Status  Partially Met      SLP SHORT TERM GOAL #2   Title  pt will generate 18/20 sentences with WNL average volume over 3 sessions    Baseline  04-07-19, 04-14-19; 04/21/19    Status  Achieved      SLP SHORT TERM GOAL #3   Title  pt will use abdominal breathing at rest 85% success over 2 sessions    Status  Deferred   focus on slowing rate and incr volume     SLP SHORT TERM GOAL #4   Title  In 5 minutes simple conversation pt will achieve average speech volume of low 70s dB over two sessions     Status  Deferred   due to pt limited concern over volume      SLP Long Term Goals - 05/03/19 1003      SLP LONG TERM GOAL #1   Title  Pt will generate loud /a/ with average of low 90s dB over total of 6 sessions    Baseline  04-19-19, 04-28-19, 05-03-19    Time  3    Period  Weeks   or 17 sessions, for all LTGs   Status  On-going      SLP LONG TERM GOAL #2   Title  pt will use abdominal breathing 70% of the time in 8 minutes simple-mod complex conversation    Time  3    Period  Weeks    Status  On-going      SLP LONG TERM GOAL #3   Title  In 10 minutes simple-mod complex conversation pt will achieve average speech volume of low 70s dB over four sessions    Status  Deferred   pt is not concerned about volume     SLP LONG TERM GOAL #4   Title  pt will self correct 50% of the time when necessary, in 8 minutes simple conversation over 3 sessions    Time  3    Period  Weeks    Status  New       Plan - 05/03/19 1003    Clinical Impression Statement  Pt presents today with 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 Pt cont with more success in reading when he spontandously slows his rate. Pt is improving in ID'ing fast rate of speech in both reading and less so in conversation, however has little success with actually slowing rate without SLP assistance. SLP believes pt will cont to benefit from skilled ST targeting incr'd volume and greater usage of abdomoinal breathing in order to improve communicative effectiveness.    Speech Therapy Frequency  2x / week    Duration  --   8 weeks or 17 visits   Treatment/Interventions  SLP instruction and feedback;Compensatory strategies;Patient/family education;Functional tasks;Cueing hierarchy;Multimodal communcation approach;Environmental controls;Aspiration precaution training;Pharyngeal strengthening exercises;Diet toleration management by SLP     Potential to Achieve Goals  Good       Patient will benefit from skilled therapeutic intervention in order to improve the following deficits and impairments:  Dysarthria and anarthria    Problem List Patient Active Problem List   Diagnosis Date Noted  . Parkinson's disease ([facility]) 02/03/2018  . Community acquired pneumonia of left lower lobe of lung 12/23/2017  . Sepsis ([place]) 12/23/2017  . Morbid obesity due to excess calories ([place]) 08/23/2016  . OSA on CPAP 08/23/2016  . Dyspnea on exertion 08/22/2016  . Umbilical hernia 58/00/6349  . Persistent atrial fibrillation ([place] [place])   . Encounter for therapeutic drug monitoring 08/12/2013  . Long term current use of anticoagulant 07/30/2010  . COLONIC POLYPS 06/03/2010  . 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 GERD 06/03/2010  . HIATAL HERNIA 06/03/2010  . BENIGN PROSTATIC HYPERTROPHY, WITH OBSTRUCTION 06/03/2010  . PSORIASIS 06/03/2010    Llewelyn Sheaffer ,[place], [place]  05/03/2019, 10:11 AM  [facility] [address] [place], [place], [address] Phone: [phone]   Fax:  [phone]   Name: ALARIK RADU MRN: [id number] Date of Birth: [birth year]

## 2019-05-04 ENCOUNTER — Telehealth: Payer: Self-pay

## 2019-05-04 NOTE — Therapy (Signed)
[facility] [address] [place], [place], [address] Phone: [phone]   Fax:  [phone]  Physical Therapy Treatment  Patient Details  Name: Stanley Wright MRN: [id number] Date of Birth: 1946-05-06 Referring Provider (PT): Tat, Wells Guiles   Encounter Date: 05/03/2019  PT End of Session - 05/04/19 0850    Visit Number  9    Number of Visits  17    Date for PT Re-Evaluation  06/23/19    Authorization Type  [employer]-10th visit progress note required    PT Start Time  0846    PT Stop Time  0928    PT Time Calculation (min)  42 min    Activity Tolerance  Patient tolerated treatment well    Behavior During Therapy  [facility] for tasks assessed/performed       Past Medical History:  Diagnosis Date  . Arthritis   . BENIGN PROSTATIC HYPERTROPHY, WITH OBSTRUCTION   . Cancer (HCC)    skin, basal, squamous  . COLONIC POLYPS   . Diverticulitis   . DVT (deep venous thrombosis) ([place]) 2000  . Dysrhythmia    Hx Afib- 2017  . Factor 5 Leiden mutation, heterozygous ([place])   . GERD (gastroesophageal reflux disease)    not on medication  . HIATAL HERNIA   . ITP (idiopathic thrombocytopenic purpura) 2003  . Long term current use of anticoagulant   . Parkinson's disease ([place]) 02/03/2018  . PSORIASIS   . Pulmonary embolus ([place]) 2000  . Shortness of breath dyspnea    at times - Talking alot  . Sleep apnea     Past Surgical History:  Procedure Laterality Date  . CARDIOVERSION N/A 11/22/2015   Procedure: CARDIOVERSION;  Surgeon: Sanda Klein, MD;  [facility];  Service: Cardiovascular;  Laterality: N/A;  . COLONOSCOPY    . HERNIA REPAIR Left    Inguinal- x2 . mesh x1  . INSERTION OF MESH N/A 02/25/2016   Procedure: INSERTION OF MESH;  Surgeon: Rolm Bookbinder, MD;  Location: [facility];  Service: General;  Laterality: N/A;  . LUNG REMOVAL, PARTIAL Right 2004   was fungal and not cancerous  . PROSTATE SURGERY     . UMBILICAL HERNIA REPAIR N/A 02/25/2016   Procedure: LAPAROSCOPIC UMBILICAL HERNIA REPAIR;  Surgeon: Rolm Bookbinder, MD;  Location: [facility];  Service: General;  Laterality: N/A;    There were no vitals filed for this visit.  Subjective Assessment - 05/03/19 0847    Subjective  Had one fall; went out to the orchard, using the walking pole with lots of roots and vines and tripped and fell on R side.  No pain.    Pertinent History  Parkinson's disease; CHF    Patient Stated Goals  Pt's goal for PT is to get back to increased movement confidence.    Currently in Pain?  No/denies                       [facility] Adult PT Treatment/Exercise - 05/04/19 0845      Ambulation/Gait   Ambulation/Gait  Yes    Ambulation/Gait Assistance  5: Supervision    Ambulation/Gait Assistance Details  Short distance gait (<50 ft) in gym area, to pick up cone, practice turns and bring cone back to counter, x 5 reps.  Then practiced x 5 reps using walking pole, to assist with smoothness, ease of turns.    Ambulation Distance (Feet)  60 Feet   x 6  reps in hallway, using walking pole to focus on turns   Assistive device  None   single walking pole   Gait Pattern  Step-through pattern;Decreased step length - right;Decreased step length - left;Decreased dorsiflexion - right;Decreased dorsiflexion - left;Festinating;Shuffle;Decreased trunk rotation;Narrow base of support;Poor foot clearance - left;Poor foot clearance - right;Step-to pattern;Decreased arm swing - left    Ambulation Surface  Level;Indoor    Gait Comments  In addition to pt's festination with turns, pt c/o continued R foot pain with increased bouts of walking and turns.  Therefore, instructed in use of walking pole to assist with stability, ease of turns.  (Pt states he will likely be contacting orthopedic MD about his foot pain again).          Balance Exercises - 05/03/19 0855      Balance Exercises: Standing   Stepping Strategy   Anterior;Posterior;Lateral;UE support;10 reps;Foam/compliant surface   Cues for foot clearance, weightshift   Retro Gait  Upper extremity support;5 reps;Foam/compliant surface    Sidestepping  Foam/compliant support;Upper extremity support;5 reps   Cues to take fewer steps (pt takes 11 initially, decr to 4)   Marching Limitations  Marching in place x 10 reps, cues for increased step height/stance time; on solid and compliant mat surface    Other Standing Exercises  Wide BOS lateral weigthshifting x 10 reps, then stagger stance forward/back weigthshifting x 10 reps each.  Forward walking along blue mat surface, cues for counting steps (to take less steps), x 6 reps along counter.        PT Education - 05/04/19 0849    Education Details  Use of walking pole for shorter distances in home and with turns (if not using [employer]); pt to contact orthopedic MD regarding continued foot pain    Person(s) Educated  Patient    Methods  Explanation    Comprehension  Verbalized understanding;Returned demonstration;Verbal cues required       PT Short Term Goals - 04/22/19 1444      PT SHORT TERM GOAL #1   Title  Pt will be independent with HEP for improved balance, transfers, gait.  TARGET 04/22/2019    Time  4    Period  Weeks    Status  Partially Met    Target Date  04/22/19      PT SHORT TERM GOAL #2   Title  Pt will improve 5x sit<>stand test to less than or equal to 13 seconds for improved transfer efficiency and safety.    Baseline  with UE support, 11.4 seconds 04/19/2019    Time  4    Period  Weeks    Status  Achieved    Target Date  04/22/19      PT SHORT TERM GOAL #3   Title  The patient will perform 360 degree turn in less than 5 seconds for improved turn efficiency and safety.    Baseline  R 6.13 sec, 8 sec    Time  4    Period  Weeks    Status  Not Met    Target Date  04/22/19      PT SHORT TERM GOAL #4   Title  Pt will verbalize/demo understanding of tips to reduce festination  with turns and gait.    Baseline  Pt able to verbalize, but needs cues for full demo.    Time  4    Period  Weeks    Status  Partially Met    Target Date  04/22/19        PT Long Term Goals - 03/25/19 1306      PT LONG TERM GOAL #1   Title  Pt will verbalize/demonstrate understanding of lifting/carrying objects with bilateral UEs, for improved safety with gait.    Time  8    Period  Weeks    Status  New    Target Date  05/20/19      PT LONG TERM GOAL #2   Title  Pt will improve MiniBESTest score to at least 22/28 for decreased fall risk.    Time  8    Period  Weeks    Status  New    Target Date  05/20/19      PT LONG TERM GOAL #3   Title  Pt will improve TUG/TUG cognitive score to </= 10% difference for improved dual tasking.    Time  8    Period  Weeks    Status  New    Target Date  05/20/19            Plan - 05/04/19 0850    Clinical Impression Statement  Focus of skilled PT session today on step and weigthshift activities on solid and compliant surfaces, to improve foot clearance and balance reactions.  Practiced dual tasking with carrying cones, with pt continueing to have festination with turns at times.  Utilized walking pole to help with stability and ease of turns, with pt needing cues throughout, for sequence of pole and avoid switching cane from hand to hand.  Pt will continue to benefit from skilled PT to address improved balance and gait.    Personal Factors and Comorbidities  Comorbidity 3+    Comorbidities  PMH includes arthritis,DVT, A-fib, GERD, sleep apnea    Examination-Activity Limitations  Stand;Transfers;Locomotion Level    Examination-Participation Restrictions  Community Activity    Stability/Clinical Decision Making  Evolving/Moderate complexity    Rehab Potential  Good    PT Frequency  2x / week    PT Duration  8 weeks   plus eval   PT Treatment/Interventions  ADLs/Self Care Home Management;DME Instruction;Balance training;Therapeutic  exercise;Therapeutic activities;Functional mobility training;Gait training;Neuromuscular re-education;Patient/family education    PT Next Visit Plan  10th Visit progress note next visit; continue balance/compliant surfaces, functional strengthening, stretching; gait and changes of direction, dual tasking with gait; work towards LTGs.    Consulted and Agree with Plan of Care  Patient       Patient will benefit from skilled therapeutic intervention in order to improve the following deficits and impairments:  Abnormal gait, Decreased balance, Decreased mobility, Decreased strength, Postural dysfunction, Difficulty walking  Visit Diagnosis: Unsteadiness on feet  Other abnormalities of gait and mobility  Other symptoms and signs involving the nervous system     Problem List Patient Active Problem List   Diagnosis Date Noted  . Parkinson's disease ([place]) 02/03/2018  . Community acquired pneumonia of left lower lobe of lung 12/23/2017  . Sepsis ([place]) 12/23/2017  . Morbid obesity due to excess calories ([place]) 08/23/2016  . OSA on CPAP 08/23/2016  . Dyspnea on exertion 08/22/2016  . Umbilical hernia 16/96/7893  . Persistent atrial fibrillation ([place])   . Encounter for therapeutic drug monitoring 08/12/2013  . Long term current use of anticoagulant 07/30/2010  . COLONIC POLYPS 06/03/2010  . 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 GERD 06/03/2010  . HIATAL HERNIA 06/03/2010  . BENIGN PROSTATIC HYPERTROPHY, WITH OBSTRUCTION 06/03/2010  . PSORIASIS 06/03/2010    MARRIOTT,AMY  W. 05/04/2019, 8:53 AM  Frazier Butt., PT   [facility] [address] [place], [place], [address] Phone: [phone]   Fax:  [phone]  Name: Stanley Wright MRN: [id number] Date of Birth: [birth year]

## 2019-05-04 NOTE — Telephone Encounter (Signed)
Advised patient he cannot have his botox earlier due to insurance purposes.

## 2019-05-05 ENCOUNTER — Ambulatory Visit: Payer: PPO | Admitting: Physical Therapy

## 2019-05-05 ENCOUNTER — Ambulatory Visit: Payer: PPO | Admitting: Speech Pathology

## 2019-05-10 ENCOUNTER — Ambulatory Visit: Payer: PPO

## 2019-05-11 ENCOUNTER — Other Ambulatory Visit: Payer: Self-pay

## 2019-05-11 ENCOUNTER — Ambulatory Visit (INDEPENDENT_AMBULATORY_CARE_PROVIDER_SITE_OTHER): Payer: PPO | Admitting: Family

## 2019-05-11 ENCOUNTER — Encounter: Payer: Self-pay | Admitting: Family

## 2019-05-11 VITALS — Ht 72.0 in | Wt 274.5 lb

## 2019-05-11 DIAGNOSIS — M19071 Primary osteoarthritis, right ankle and foot: Secondary | ICD-10-CM

## 2019-05-11 NOTE — Progress Notes (Signed)
Office Visit Note   Patient: Stanley Wright           Date of Birth: 06-20-1945           MRN: [id number] Visit Date: 05/11/2019              Requested by: Maude Leriche, PA-C [place],  [place] [address] PCP: Maude Leriche, PA-C  Chief Complaint  Patient presents with  . Right Foot - Pain, Follow-up      HPI: This is a pleasant gentleman who presents in follow-up for his right midfoot pain he was last seen approximately a month ago and was diagnosed with talonavicular arthritis he was encouraged to get supportive stiff soled shoes and work with physical therapy to help with his Achilles stiffness.  He returns today saying it is not helped at all he is concerned that because of his Parkinson's disease this is causing him difficulties with his gait.  He is not a diabetic nor smoker his most recent albumin was 4  Assessment & Plan: Visit Diagnoses: No diagnosis found.  Plan: I discussed with the patient the option could be a talonavicular arthrodesis.  This was mentioned by Dr. Sharol Given at his last visit.  I went over the risks of the procedure including nonunion and infection as well as the outcomes I do have some concerns as he has significant bilateral lower extremity swelling.  I have asked him if he has compression hose and he said yes and that they were a prescription.  I have told him that he would present a much better healing risk if he could work on the swelling in his lower extremities  Follow-Up Instructions: No follow-ups on file.   Ortho Exam  Patient is alert, oriented, no adenopathy, well-dressed, normal affect, normal respiratory effort. Right foot.  Moderate amount of soft tissue swelling in the feet up into the legs no skin breakdown but obviously has some venous stasis disease.  Dorsalis pedis pulse is palpable he is focally tender over the talonavicular joint he dorsiflexes to just past neutral at most.  Previous x-rays were reviewed and  demonstrate an avulsion fracture over an arthritic talonavicular joint.  Cavus foot.  Of note he is wearing crocs today without his compression hose although he does say that he has purchased and has been using a stiff soled shoe  Imaging: No results found. No images are attached to the encounter.  Labs: Lab Results  Component Value Date   HGBA1C 6.3 11/21/2015   HGBA1C 6.2 06/12/2015   REPTSTATUS 12/24/2017 FINAL 12/23/2017   CULT  12/23/2017    NO GROWTH Performed at [facility], [place] [address] [place], [place] [address]    LABORGA ESCHERICHIA COLI 12/22/2017     Lab Results  Component Value Date   ALBUMIN 4.1 01/17/2019   ALBUMIN 2.9 (L) 12/26/2017   ALBUMIN 3.0 (L) 12/25/2017    Lab Results  Component Value Date   MG 1.9 12/25/2017   No results found for: VD25OH  No results found for: PREALBUMIN CBC EXTENDED Latest Ref Rng & Units 12/26/2017 12/25/2017 12/23/2017  WBC 4.0 - 10.5 K/uL 4.7 4.8 7.3  RBC 4.22 - 5.81 MIL/uL 4.22 4.47 4.04(L)  HGB 13.0 - 17.0 g/dL 13.6 14.3 13.1  HCT 39.0 - 52.0 % 40.8 43.3 40.4  PLT 150 - 400 K/uL 118(L) 107(L) 94(L)  NEUTROABS 1.7 - 7.7 K/uL - - -  LYMPHSABS 0.7 - 4.0 K/uL - - -  Body mass index is 37.23 kg/m.  Orders:  No orders of the defined types were placed in this encounter.  No orders of the defined types were placed in this encounter.    Procedures: No procedures performed  Clinical Data: No additional findings.  ROS:  All other systems negative, except as noted in the HPI. Review of Systems  Objective: Vital Signs: Ht 6' (1.829 m)   Wt 274 lb 8 oz (124.5 kg)   BMI 37.23 kg/m   Specialty Comments:  No specialty comments available.  PMFS History: Patient Active Problem List   Diagnosis Date Noted  . Parkinson's disease ([place]) 02/03/2018  . Community acquired pneumonia of left lower lobe of lung 12/23/2017  . Sepsis ([place]) 12/23/2017  . Morbid obesity due to excess calories ([place]) 08/23/2016   . OSA on CPAP 08/23/2016  . Dyspnea on exertion 08/22/2016  . Umbilical hernia [id number]  . Persistent atrial fibrillation ([place])   . Encounter for therapeutic drug monitoring 08/12/2013  . Long term current use of anticoagulant 07/30/2010  . COLONIC POLYPS 06/03/2010  . 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 GERD 06/03/2010  . HIATAL HERNIA 06/03/2010  . BENIGN PROSTATIC HYPERTROPHY, WITH OBSTRUCTION 06/03/2010  . PSORIASIS 06/03/2010   Past Medical History:  Diagnosis Date  . Arthritis   . BENIGN PROSTATIC HYPERTROPHY, WITH OBSTRUCTION   . Cancer (HCC)    skin, basal, squamous  . COLONIC POLYPS   . Diverticulitis   . DVT (deep venous thrombosis) ([place]) 2000  . Dysrhythmia    Hx Afib- 2017  . Factor 5 Leiden mutation, heterozygous ([place])   . GERD (gastroesophageal reflux disease)    not on medication  . HIATAL HERNIA   . ITP (idiopathic thrombocytopenic purpura) 2003  . Long term current use of anticoagulant   . Parkinson's disease ([place]) 02/03/2018  . PSORIASIS   . Pulmonary embolus ([place]) 2000  . Shortness of breath dyspnea    at times - Talking alot  . Sleep apnea     Family History  Problem Relation Age of Onset  . Cancer Father   . Breast cancer Sister   . Heart disease Brother   . Cancer Maternal Grandmother   . Stroke Paternal Grandfather     Past Surgical History:  Procedure Laterality Date  . CARDIOVERSION N/A 11/22/2015   Procedure: CARDIOVERSION;  Surgeon: Sanda Klein, MD;  [facility];  Service: Cardiovascular;  Laterality: N/A;  . COLONOSCOPY    . HERNIA REPAIR Left    Inguinal- x2 . mesh x1  . INSERTION OF MESH N/A 02/25/2016   Procedure: INSERTION OF MESH;  Surgeon: Rolm Bookbinder, MD;  Location: [facility];  Service: General;  Laterality: N/A;  . LUNG REMOVAL, PARTIAL Right 2004   was fungal and not cancerous  . PROSTATE SURGERY    . UMBILICAL HERNIA REPAIR N/A 02/25/2016   Procedure: LAPAROSCOPIC UMBILICAL HERNIA REPAIR;  Surgeon: Rolm Bookbinder, MD;  Location: [facility];  Service: General;  Laterality: N/A;   Social History   Occupational History    Employer: RETIRED    Comment: Engineer, structural  Tobacco Use  . Smoking status: Former Smoker    Packs/day: 1.00    Quit date: 06/16/1985    Years since quitting: 33.9  . Smokeless tobacco: Never Used  . Tobacco comment: quit approx age 61-   Substance and Sexual Activity  . Alcohol use: No  . Drug use: No  . Sexual activity: Yes

## 2019-05-17 ENCOUNTER — Ambulatory Visit: Payer: PPO | Admitting: Physical Therapy

## 2019-05-17 ENCOUNTER — Ambulatory Visit: Payer: PPO | Attending: Neurology | Admitting: Speech Pathology

## 2019-05-17 ENCOUNTER — Other Ambulatory Visit: Payer: Self-pay

## 2019-05-17 ENCOUNTER — Encounter: Payer: Self-pay | Admitting: Physical Therapy

## 2019-05-17 ENCOUNTER — Ambulatory Visit: Payer: PPO | Admitting: Occupational Therapy

## 2019-05-17 DIAGNOSIS — R2681 Unsteadiness on feet: Secondary | ICD-10-CM | POA: Diagnosis not present

## 2019-05-17 DIAGNOSIS — R278 Other lack of coordination: Secondary | ICD-10-CM | POA: Insufficient documentation

## 2019-05-17 DIAGNOSIS — R2689 Other abnormalities of gait and mobility: Secondary | ICD-10-CM

## 2019-05-17 DIAGNOSIS — R471 Dysarthria and anarthria: Secondary | ICD-10-CM | POA: Insufficient documentation

## 2019-05-17 DIAGNOSIS — R29818 Other symptoms and signs involving the nervous system: Secondary | ICD-10-CM | POA: Insufficient documentation

## 2019-05-17 NOTE — Therapy (Signed)
[facility] [address] [place], [place], [address] Phone: [phone]   Fax:  [phone]  Speech Language Pathology Treatment  Patient Details  Name: Stanley Wright MRN: [id number] Date of Birth: [birth year] Referring Provider (SLP): Alonza Bogus, DO   Encounter Date: 05/17/2019  End of Session - 05/17/19 1036    Visit Number  12    Number of Visits  17    Date for SLP Re-Evaluation  06/22/19    SLP Start Time  0806    SLP Stop Time   0847    SLP Time Calculation (min)  41 min    Activity Tolerance  Patient tolerated treatment well       Past Medical History:  Diagnosis Date  . Arthritis   . BENIGN PROSTATIC HYPERTROPHY, WITH OBSTRUCTION   . Cancer (HCC)    skin, basal, squamous  . COLONIC POLYPS   . Diverticulitis   . DVT (deep venous thrombosis) ([place]) 2000  . Dysrhythmia    Hx Afib- 2017  . Factor 5 Leiden mutation, heterozygous ([place])   . GERD (gastroesophageal reflux disease)    not on medication  . HIATAL HERNIA   . ITP (idiopathic thrombocytopenic purpura) 2003  . Long term current use of anticoagulant   . Parkinson's disease ([place]) 02/03/2018  . PSORIASIS   . Pulmonary embolus ([place]) 2000  . Shortness of breath dyspnea    at times - Talking alot  . Sleep apnea     Past Surgical History:  Procedure Laterality Date  . CARDIOVERSION N/A 11/22/2015   Procedure: CARDIOVERSION;  Surgeon: Sanda Klein, MD;  [facility];  Service: Cardiovascular;  Laterality: N/A;  . COLONOSCOPY    . HERNIA REPAIR Left    Inguinal- x2 . mesh x1  . INSERTION OF MESH N/A 02/25/2016   Procedure: INSERTION OF MESH;  Surgeon: Rolm Bookbinder, MD;  Location: [facility];  Service: General;  Laterality: N/A;  . LUNG REMOVAL, PARTIAL Right 2004   was fungal and not cancerous  . PROSTATE SURGERY    . UMBILICAL HERNIA REPAIR N/A 02/25/2016   Procedure: LAPAROSCOPIC UMBILICAL HERNIA REPAIR;  Surgeon: Rolm Bookbinder,  MD;  Location: [facility];  Service: General;  Laterality: N/A;    There were no vitals filed for this visit.  Subjective Assessment - 05/17/19 0811    Subjective  "Trying to give each word it's value."    Currently in Pain?  Yes    Pain Score  6     Pain Location  Foot    Pain Orientation  Right    Pain Descriptors / Indicators  Aching    Pain Type  Chronic pain            ADULT SLP TREATMENT - 05/17/19 0813      General Information   Behavior/Cognition  Alert;Cooperative;Pleasant mood      Treatment Provided   Treatment provided  Cognitive-Linquistic      Pain Assessment   Pain Assessment  --      Cognitive-Linquistic Treatment   Treatment focused on  Dysarthria    Skilled Treatment  Pt entered treatment room with sub-WNL volume. SLP used loud Hey-/a/ to recalibrate pt's loudness in conversation and speech tasks. Initially required mod cues for breath support and effort; SLP able to fade to rare min A and average dB for remaining /a/ averaged in low 90s DB. Patient carryover of loudness into structured task (generating sentences) with occasional min A. Pt  with rapid rate of speech in spontaneous conversation and when generating sentences. Pt most successful with changes to rate when reading his scripture for the week (usual mod cues, fading to occasional min-mod A).       Assessment / Recommendations / Plan   Plan  Continue with current plan of care      Progression Toward Goals   Progression toward goals  Progressing toward goals         SLP Short Term Goals - 05/17/19 1037      SLP SHORT TERM GOAL #1   Title  pt will produce loud /a/or "Hey-ah" at average low 90s dB over 4 sessions    Baseline  04-19-19    Status  Partially Met      SLP SHORT TERM GOAL #2   Title  pt will generate 18/20 sentences with WNL average volume over 3 sessions    Baseline  04-07-19, 04-14-19; 04/21/19    Status  Achieved      SLP SHORT TERM GOAL #3   Title  pt will use abdominal  breathing at rest 85% success over 2 sessions    Status  Deferred   focus on slowing rate and incr volume     SLP SHORT TERM GOAL #4   Title  In 5 minutes simple conversation pt will achieve average speech volume of low 70s dB over two sessions    Status  Deferred   due to pt limited concern over volume      SLP Long Term Goals - 05/17/19 1037      SLP LONG TERM GOAL #1   Title  Pt will generate loud /a/ with average of low 90s dB over total of 6 sessions    Baseline  04-19-19, 04-28-19, 05-03-19    Time  2    Period  Weeks   or 17 sessions, for all LTGs   Status  On-going      SLP LONG TERM GOAL #2   Title  pt will use abdominal breathing 70% of the time in 8 minutes simple-mod complex conversation    Time  2    Period  Weeks    Status  On-going      SLP LONG TERM GOAL #3   Title  In 10 minutes simple-mod complex conversation pt will achieve average speech volume of low 70s dB over four sessions    Status  Deferred   pt is not concerned about volume     SLP LONG TERM GOAL #4   Title  pt will self correct 50% of the time when necessary, in 8 minutes simple conversation over 3 sessions    Time  2    Period  Weeks    Status  On-going       Plan - 05/17/19 1036    Clinical Impression Statement  Pt presents today with 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 Pt cont with more success in reading when he spontaneously slows his rate. Pt is improving in ID'ing fast rate of speech in both reading and less so in conversation, however has little success with actually slowing rate without SLP assistance. SLP believes pt will cont to benefit from skilled ST targeting incr'd volume and greater usage of abdominal breathing in order to improve communicative effectiveness.    Speech Therapy Frequency  2x / week    Treatment/Interventions  SLP instruction and feedback;Compensatory  strategies;Patient/family education;Functional tasks;Cueing hierarchy;Multimodal communcation approach;Environmental controls;Aspiration precaution training;Pharyngeal strengthening exercises;Diet toleration management by SLP    Potential to Achieve Goals  Good    Consulted and Agree with Plan of Care  Patient       Patient will benefit from skilled therapeutic intervention in order to improve the following deficits and impairments:   Dysarthria and anarthria    Problem List Patient Active Problem List   Diagnosis Date Noted  . Parkinson's disease ([place]) 02/03/2018  . Community acquired pneumonia of left lower lobe of lung 12/23/2017  . Sepsis ([place]) 12/23/2017  . Morbid obesity due to excess calories ([place]) 08/23/2016  . OSA on CPAP 08/23/2016  . Dyspnea on exertion 08/22/2016  . Umbilical hernia 68/85/2074  . Persistent atrial fibrillation ([place])   . Encounter for therapeutic drug monitoring 08/12/2013  . Long term current use of anticoagulant 07/30/2010  . COLONIC POLYPS 06/03/2010  . 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 GERD 06/03/2010  . HIATAL HERNIA 06/03/2010  . BENIGN PROSTATIC HYPERTROPHY, WITH OBSTRUCTION 06/03/2010  . PSORIASIS 06/03/2010   Deneise Lever, [place], [place] 05/17/2019, 10:38 AM  [facility] [address] [place], [place], [address] Phone: [phone]   Fax:  [phone]   Name: Stanley Wright MRN: [id number] Date of Birth: 08-26-1945

## 2019-05-17 NOTE — Therapy (Signed)
[facility] [address] [place], [place], [address] Phone: [phone]   Fax:  [phone]  Physical Therapy Treatment/10th Visit Progress Note   Patient Details  Name: Stanley Wright MRN: [id number] Date of Birth: September 02, 1945 Referring Provider (PT): Tat, Wells Guiles   Encounter Date: 05/17/2019  PT End of Session - 05/17/19 1507    Visit Number  10    Number of Visits  17    Date for PT Re-Evaluation  06/23/19    Authorization Type  [employer]-10th visit progress note required    PT Start Time  0846    PT Stop Time  0928    PT Time Calculation (min)  42 min    Activity Tolerance  Patient tolerated treatment well   Pt rates pain consistently with gait as 6-7/10   Behavior During Therapy  [facility] for tasks assessed/performed       Past Medical History:  Diagnosis Date  . Arthritis   . BENIGN PROSTATIC HYPERTROPHY, WITH OBSTRUCTION   . Cancer (HCC)    skin, basal, squamous  . COLONIC POLYPS   . Diverticulitis   . DVT (deep venous thrombosis) ([place]) 2000  . Dysrhythmia    Hx Afib- 2017  . Factor 5 Leiden mutation, heterozygous ([place])   . GERD (gastroesophageal reflux disease)    not on medication  . HIATAL HERNIA   . ITP (idiopathic thrombocytopenic purpura) 2003  . Long term current use of anticoagulant   . Parkinson's disease ([place]) 02/03/2018  . PSORIASIS   . Pulmonary embolus ([place]) 2000  . Shortness of breath dyspnea    at times - Talking alot  . Sleep apnea     Past Surgical History:  Procedure Laterality Date  . CARDIOVERSION N/A 11/22/2015   Procedure: CARDIOVERSION;  Surgeon: Sanda Klein, MD;  [facility];  Service: Cardiovascular;  Laterality: N/A;  . COLONOSCOPY    . HERNIA REPAIR Left    Inguinal- x2 . mesh x1  . INSERTION OF MESH N/A 02/25/2016   Procedure: INSERTION OF MESH;  Surgeon: Rolm Bookbinder, MD;  Location: [facility];  Service: General;  Laterality: N/A;  . LUNG  REMOVAL, PARTIAL Right 2004   was fungal and not cancerous  . PROSTATE SURGERY    . UMBILICAL HERNIA REPAIR N/A 02/25/2016   Procedure: LAPAROSCOPIC UMBILICAL HERNIA REPAIR;  Surgeon: Rolm Bookbinder, MD;  Location: [facility];  Service: General;  Laterality: N/A;    There were no vitals filed for this visit.  Subjective Assessment - 05/17/19 0850    Subjective  Saw the orthopedist NP and they think I'm headed for surgery for my foot.  Pt not aware of any restrictions or precautions right now.    Pertinent History  Parkinson's disease; CHF    Patient Stated Goals  Pt's goal for PT is to get back to increased movement confidence.    Currently in Pain?  Yes    Pain Score  6     Pain Location  Foot    Pain Orientation  Right    Pain Descriptors / Indicators  Aching    Pain Type  Chronic pain    Pain Onset  More than a month ago    Pain Frequency  Intermittent    Aggravating Factors   walking    Pain Relieving Factors  sitting                       [facility] Adult  PT Treatment/Exercise - 05/17/19 0001      Ambulation/Gait   Ambulation/Gait  Yes    Ambulation/Gait Assistance  5: Supervision    Ambulation/Gait Assistance Details  Use of 1 walking pole, then bilateral walking poles.  Pt does not feel single walking pole is helpful for sequence of gait, but helps witht urns.  Pt able to do good job of reciprocal sequence with bilateral walking poles, unsure if he feels he would really use them.  PT reiterates that this provides UE support for improved step length, cadence of gait for potential of decreased festination with gait.    Ambulation Distance (Feet)  115 Feet   single pole, 200 ft bilateral poles; 80 ft x 2 no device   Assistive device  None   single walking pole, bilateral walking poles   Gait Pattern  Step-through pattern;Decreased step length - right;Decreased step length - left;Decreased dorsiflexion - right;Decreased dorsiflexion - left;Festinating;Shuffle;Decreased  trunk rotation;Narrow base of support;Poor foot clearance - left;Poor foot clearance - right;Step-to pattern;Decreased arm swing - left    Ambulation Surface  Level;Indoor    Gait velocity  10.96 sec (no device)= 2.99 ft/sec   12.65 sec (single walking pole)= 2.59 ft/sec     Standardized Balance Assessment   Standardized Balance Assessment  Timed Up and Go Test      Timed Up and Go Test   TUG  Normal TUG;Cognitive TUG    Normal TUG (seconds)  14.25    Cognitive TUG (seconds)  18.32      Self-Care   Self-Care  Other Self-Care Comments    Other Self-Care Comments   Discussed information regarding pt's potential for upcoming foot surgery:  questions to ask MD regarding restrictions, precautions, weightbearing after surgery, any assistive devices or assistance needed at home post surgery.  Discussed Aware in Care Kit, Parkinson's needs with surgical procedures.        Exercises   Exercises  Other Exercises    Other Exercises   Gentle range of motion for R foot/ankle, using tennis ball, forward/back rolling without pain; attempted lateral rolling, but pt c/o increased pain.             PT Education - 05/17/19 1506    Education Details  Educated in information ahead of potential upcoming foot surgery.  Use of single/bilateral walking poles to help with gait stability, step length.    Person(s) Educated  Patient    Methods  Explanation;Demonstration;Handout    Comprehension  Verbalized understanding;Returned demonstration       PT Short Term Goals - 04/22/19 1444      PT SHORT TERM GOAL #1   Title  Pt will be independent with HEP for improved balance, transfers, gait.  TARGET 04/22/2019    Time  4    Period  Weeks    Status  Partially Met    Target Date  04/22/19      PT SHORT TERM GOAL #2   Title  Pt will improve 5x sit<>stand test to less than or equal to 13 seconds for improved transfer efficiency and safety.    Baseline  with UE support, 11.4 seconds 04/19/2019    Time  4     Period  Weeks    Status  Achieved    Target Date  04/22/19      PT SHORT TERM GOAL #3   Title  The patient will perform 360 degree turn in less than 5 seconds for improved turn efficiency and safety.  Baseline  R 6.13 sec, 8 sec    Time  4    Period  Weeks    Status  Not Met    Target Date  04/22/19      PT SHORT TERM GOAL #4   Title  Pt will verbalize/demo understanding of tips to reduce festination with turns and gait.    Baseline  Pt able to verbalize, but needs cues for full demo.    Time  4    Period  Weeks    Status  Partially Met    Target Date  04/22/19        PT Long Term Goals - 03/25/19 1306      PT LONG TERM GOAL #1   Title  Pt will verbalize/demonstrate understanding of lifting/carrying objects with bilateral UEs, for improved safety with gait.    Time  8    Period  Weeks    Status  New    Target Date  05/20/19      PT LONG TERM GOAL #2   Title  Pt will improve MiniBESTest score to at least 22/28 for decreased fall risk.    Time  8    Period  Weeks    Status  New    Target Date  05/20/19      PT LONG TERM GOAL #3   Title  Pt will improve TUG/TUG cognitive score to </= 10% difference for improved dual tasking.    Time  8    Period  Weeks    Status  New    Target Date  05/20/19            Plan - 05/17/19 1508    Clinical Impression Statement  10th Visit Progress Note, covering dates 03/24/2019-05/17/2019:  Pt having ongoing c/o R foot pain and has seen orthopedist, reporting he may need foot surgery.  Does not report any restrictions/precautions for PT at this time.  Assessed TUG score and TUG cognitive score:  14.25 sec and 18.32 sec, >10% difference in score, demonstrating difficulty with dual tasking.  Gait velocity 2.99 ft/sec with no device, with antalgic gait pattern.  Pt has been instructed in and is able to return demo techniques to decrease festination with gait and turns; however, at times with turns, distractions, cognitive and manual  tasks, pt demonstrates festination with gait.  No falls noted or reported.  He will conitnue to benefit from skilled PT to further address and practice tips to reduce freezing and festination with gait for improved mobility and decreased fall risk.  Plan to work towards LTGs and plan for d/c next week.    PT Frequency  2x / week    PT Duration  8 weeks   plus eval   PT Treatment/Interventions  ADLs/Self Care Home Management;DME Instruction;Balance training;Therapeutic exercise;Therapeutic activities;Functional mobility training;Gait training;Neuromuscular re-education;Patient/family education    PT Next Visit Plan  Review HEP, functional strengthening, gait and changes of direction; discuss/review fall prevention; work towards [employer] and Agree with Plan of Care  Patient       Patient will benefit from skilled therapeutic intervention in order to improve the following deficits and impairments:  Abnormal gait, Decreased balance, Decreased mobility, Decreased strength, Postural dysfunction, Difficulty walking  Visit Diagnosis: Unsteadiness on feet  Other abnormalities of gait and mobility     Problem List Patient Active Problem List   Diagnosis Date Noted  . Parkinson's disease ([place]) 02/03/2018  . Community acquired pneumonia of left  lower lobe of lung 12/23/2017  . Sepsis ([place]) 12/23/2017  . Morbid obesity due to excess calories ([place]) 08/23/2016  . OSA on CPAP 08/23/2016  . Dyspnea on exertion 08/22/2016  . Umbilical hernia 16/12/3708  . Persistent atrial fibrillation ([place])   . Encounter for therapeutic drug monitoring 08/12/2013  . Long term current use of anticoagulant 07/30/2010  . COLONIC POLYPS 06/03/2010  . 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 GERD 06/03/2010  . HIATAL HERNIA 06/03/2010  . BENIGN PROSTATIC HYPERTROPHY, WITH OBSTRUCTION 06/03/2010  . PSORIASIS 06/03/2010    MARRIOTT,AMY W. 05/17/2019, 3:16 PM  Frazier Butt., PT   [facility] [address] [place], [place], [address] Phone: [phone]   Fax:  [phone]  Name: ARYAAN PERSICHETTI MRN: [id number] Date of Birth: 10/17/1945

## 2019-05-17 NOTE — Therapy (Signed)
[facility] [address] [place], [place], [address] Phone: [phone]   Fax:  [phone]  Occupational Therapy Treatment  Patient Details  Name: Stanley Wright MRN: [id number] Date of Birth: 11-06-1945 Referring Provider (OT): Dr. Carles Collet   Encounter Date: 05/17/2019  OT End of Session - 05/17/19 0719    Visit Number  9    Number of Visits  11    Date for OT Re-Evaluation  05/01/19    Authorization Type  [employer]    Authorization - Visit Number  9   progress report completed on 05/17/2019   Authorization - Number of Visits  19    OT Start Time  0718    OT Stop Time  0756    OT Time Calculation (min)  38 min    Activity Tolerance  Patient tolerated treatment well    Behavior During Therapy  [facility] for tasks assessed/performed       Past Medical History:  Diagnosis Date  . Arthritis   . BENIGN PROSTATIC HYPERTROPHY, WITH OBSTRUCTION   . Cancer (HCC)    skin, basal, squamous  . COLONIC POLYPS   . Diverticulitis   . DVT (deep venous thrombosis) ([place]) 2000  . Dysrhythmia    Hx Afib- 2017  . Factor 5 Leiden mutation, heterozygous ([place])   . GERD (gastroesophageal reflux disease)    not on medication  . HIATAL HERNIA   . ITP (idiopathic thrombocytopenic purpura) 2003  . Long term current use of anticoagulant   . Parkinson's disease ([place]) 02/03/2018  . PSORIASIS   . Pulmonary embolus ([place]) 2000  . Shortness of breath dyspnea    at times - Talking alot  . Sleep apnea     Past Surgical History:  Procedure Laterality Date  . CARDIOVERSION N/A 11/22/2015   Procedure: CARDIOVERSION;  Surgeon: Sanda Klein, MD;  [facility];  Service: Cardiovascular;  Laterality: N/A;  . COLONOSCOPY    . HERNIA REPAIR Left    Inguinal- x2 . mesh x1  . INSERTION OF MESH N/A 02/25/2016   Procedure: INSERTION OF MESH;  Surgeon: Rolm Bookbinder, MD;  Location: [facility];  Service: General;  Laterality: N/A;  . LUNG  REMOVAL, PARTIAL Right 2004   was fungal and not cancerous  . PROSTATE SURGERY    . UMBILICAL HERNIA REPAIR N/A 02/25/2016   Procedure: LAPAROSCOPIC UMBILICAL HERNIA REPAIR;  Surgeon: Rolm Bookbinder, MD;  Location: [facility];  Service: General;  Laterality: N/A;    There were no vitals filed for this visit.  Subjective Assessment - 05/17/19 0719    Currently in Pain?  Yes    Pain Score  6     Pain Location  Foot    Pain Orientation  Right    Pain Descriptors / Indicators  Aching    Pain Type  Chronic pain    Pain Onset  More than a month ago    Pain Frequency  Intermittent    Aggravating Factors   walking    Pain Relieving Factors  sitting             Treatment: Reviewed PWR! Basic 4 in seated 10-20 reps each, min vc for larger amplitude movements Seated functional reaching with trunk rotation to place graded clothespins on vertical antennae with right and left UE's, min v.c for performance of bigger movements. Seated PWR! hands, PWR! Up x 10 reps followed by Standing at table to fasten un fasten buttons using adapted strategy,  min v.c Copying small peg design with right and left UE for increased fine motor coordinaton with a cognitive component, min difficulty/ v.c                   OT Long Term Goals - 05/17/19 0726      OT LONG TERM GOAL #1   Title  I with PD specific HEP.    Time  5    Period  Weeks    Status  On-going   reinforce coordination HEP, pt returned demo of PWR! modified quadraped.     OT LONG TERM GOAL #2   Title  Pt will demonstrate increased ease with fatening buttons as evidenced by decreasing 3 button/ un button test  to 45 secs or less     upgraded goal-Pt will consistently perform 3 button/ unbutton in 40 secs or less.    Baseline  53.03    Time  5    Period  Weeks    Status  Revised   42.15, 34.53     OT LONG TERM GOAL #3   Title  Pt will verbalzie understanding of adpated strategies for ADLs/IADLs. and ways to prevent future  PD related complications.    Time  5    Period  Weeks    Status  On-going   needs reinforcement     OT LONG TERM GOAL #4   Title  Pt will demonstrate ability to write a sentence with 90% legibility and no significant decrease in letter size.    Baseline  50% legible sentence level    Time  5    Period  Weeks    Status  Achieved   100% legible with no significant decrease in letter size     OT LONG TERM GOAL #5   Title  Pt will report increased ease with fastening his seatbelt.    Time  5    Period  Weeks    Status  Achieved      OT LONG TERM GOAL #6   Title  Pt will increase bilateral shoulder flexion by 5* for increased ease with IADLs.    Baseline  RUE 125, LUE 120    Time  5    Period  Weeks    Status  Achieved   130 bilaterally           Plan - 05/17/19 0726    Clinical Impression Statement  Pt is progressing towards goals for the reporting period 03/24/2019-05/17/2019 however he can benefit from several more visits to reinforce ADL strategies and HEP. Pt coneintues to demonstrate abnormal posture, decreased coordination, decrased balance, and decreased functional mobility which impedes perfromance of ADLS/ IADLs.Pt agrees with continuing OT.    OT Occupational Profile and History  Problem Focused Assessment - Including review of records relating to presenting problem    Occupational performance deficits (Please refer to evaluation for details):  ADL's;IADL's;Social Participation    Body Structure / Function / Physical Skills  ADL;UE functional use;Balance;FMC;Gait;ROM;GMC;Coordination;Decreased knowledge of precautions;Decreased knowledge of use of DME;IADL;Strength;Mobility;Dexterity    Rehab Potential  Good    Clinical Decision Making  Limited treatment options, no task modification necessary    Comorbidities Affecting Occupational Performance:  May have comorbidities impacting occupational performance    Modification or Assistance to Complete Evaluation   No  modification of tasks or assist necessary to complete eval    OT Frequency  2x / week    OT Duration  --  5 weeks plus eval   OT Treatment/Interventions  Self-care/ADL training;Ultrasound;Energy conservation;Aquatic Therapy;DME and/or AE instruction;Patient/family education;Balance training;Passive range of motion;Paraffin;Gait Training;Fluidtherapy;Cryotherapy;Therapist, nutritional;Therapeutic exercise;Moist Heat;Manual Therapy;Therapeutic activities;Cognitive remediation/compensation;Neuromuscular education    Plan  continue big movements with functional activity/ADLs, reinforce HEP.    Consulted and Agree with Plan of Care  Patient       Patient will benefit from skilled therapeutic intervention in order to improve the following deficits and impairments:   Body Structure / Function / Physical Skills: ADL, UE functional use, Balance, FMC, Gait, ROM, GMC, Coordination, Decreased knowledge of precautions, Decreased knowledge of use of DME, IADL, Strength, Mobility, Dexterity       Visit Diagnosis: Other lack of coordination - Plan: Ot plan of care cert/re-cert  Unsteadiness on feet - Plan: Ot plan of care cert/re-cert  Other abnormalities of gait and mobility - Plan: Ot plan of care cert/re-cert  Other symptoms and signs involving the nervous system - Plan: Ot plan of care cert/re-cert    Problem List Patient Active Problem List   Diagnosis Date Noted  . Parkinson's disease ([place]) 02/03/2018  . Community acquired pneumonia of left lower lobe of lung 12/23/2017  . Sepsis ([place]) 12/23/2017  . Morbid obesity due to excess calories ([place]) 08/23/2016  . OSA on CPAP 08/23/2016  . Dyspnea on exertion 08/22/2016  . Umbilical hernia [id number]  . Persistent atrial fibrillation ([place])   . Encounter for therapeutic drug monitoring 08/12/2013  . Long term current use of anticoagulant 07/30/2010  . COLONIC POLYPS 06/03/2010  . 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 GERD 06/03/2010  .  HIATAL HERNIA 06/03/2010  . BENIGN PROSTATIC HYPERTROPHY, WITH OBSTRUCTION 06/03/2010  . PSORIASIS 06/03/2010    , 05/17/2019, 10:10 AM Theone Murdoch, OTR/L [phone] Phone: [phone] 10:10 AM 05/17/19 [facility] [address] [place], [place], [address] Phone: [phone]   Fax:  [phone]  Name: Stanley Wright MRN: [id number] Date of Birth: 23-Jul-1945

## 2019-05-17 NOTE — Patient Instructions (Signed)
Aware in Care Kit 1-800-4PD-INFO  [email or web address]   Questions: -Will you have weightbearing restriction on that foot? -Will you need additional assistive devices, such as a walker (standard or rolling)? -Will you need additional assistance in the home to help you out?

## 2019-05-19 ENCOUNTER — Ambulatory Visit: Payer: PPO | Admitting: Speech Pathology

## 2019-05-19 ENCOUNTER — Ambulatory Visit: Payer: PPO | Admitting: Physical Therapy

## 2019-05-19 ENCOUNTER — Other Ambulatory Visit: Payer: Self-pay

## 2019-05-19 ENCOUNTER — Encounter: Payer: Self-pay | Admitting: Physical Therapy

## 2019-05-19 DIAGNOSIS — R471 Dysarthria and anarthria: Secondary | ICD-10-CM | POA: Diagnosis not present

## 2019-05-19 DIAGNOSIS — R2681 Unsteadiness on feet: Secondary | ICD-10-CM

## 2019-05-19 DIAGNOSIS — R29818 Other symptoms and signs involving the nervous system: Secondary | ICD-10-CM

## 2019-05-19 NOTE — Patient Instructions (Signed)
CAR TRANSFERS:  -To help lift the leg over the lip:  -use leg lifter  -use belt under foot  -you can lift the foot and stop on the lip, then lift the leg the rest of the way  -To help ease of turning in the seat:  -Use transfer disk  -Try a satin sheet on the seat to lessen friction

## 2019-05-19 NOTE — Therapy (Signed)
[facility] [address] [place], [place], [address] Phone: [phone]   Fax:  [phone]  Speech Language Pathology Treatment  Patient Details  Name: Stanley Wright MRN: [id number] Date of Birth: [birth year] Referring Provider (SLP): Alonza Bogus, DO   Encounter Date: 05/19/2019  End of Session - 05/19/19 0850    Visit Number  13    Number of Visits  17    Date for SLP Re-Evaluation  06/22/19    SLP Start Time  0802    SLP Stop Time   9767    SLP Time Calculation (min)  40 min    Activity Tolerance  Patient tolerated treatment well       Past Medical History:  Diagnosis Date  . Arthritis   . BENIGN PROSTATIC HYPERTROPHY, WITH OBSTRUCTION   . Cancer (HCC)    skin, basal, squamous  . COLONIC POLYPS   . Diverticulitis   . DVT (deep venous thrombosis) ([place]) 2000  . Dysrhythmia    Hx Afib- 2017  . Factor 5 Leiden mutation, heterozygous ([place])   . GERD (gastroesophageal reflux disease)    not on medication  . HIATAL HERNIA   . ITP (idiopathic thrombocytopenic purpura) 2003  . Long term current use of anticoagulant   . Parkinson's disease ([place]) 02/03/2018  . PSORIASIS   . Pulmonary embolus ([place]) 2000  . Shortness of breath dyspnea    at times - Talking alot  . Sleep apnea     Past Surgical History:  Procedure Laterality Date  . CARDIOVERSION N/A 11/22/2015   Procedure: CARDIOVERSION;  Surgeon: Sanda Klein, MD;  [facility];  Service: Cardiovascular;  Laterality: N/A;  . COLONOSCOPY    . HERNIA REPAIR Left    Inguinal- x2 . mesh x1  . INSERTION OF MESH N/A 02/25/2016   Procedure: INSERTION OF MESH;  Surgeon: Rolm Bookbinder, MD;  Location: [facility];  Service: General;  Laterality: N/A;  . LUNG REMOVAL, PARTIAL Right 2004   was fungal and not cancerous  . PROSTATE SURGERY    . UMBILICAL HERNIA REPAIR N/A 02/25/2016   Procedure: LAPAROSCOPIC UMBILICAL HERNIA REPAIR;  Surgeon: Rolm Bookbinder,  MD;  Location: [facility];  Service: General;  Laterality: N/A;    There were no vitals filed for this visit.  Subjective Assessment - 05/19/19 0805    Subjective  "I'm doing a little better I believe"    Currently in Pain?  Yes    Pain Score  6     Pain Location  Foot    Pain Orientation  Right            ADULT SLP TREATMENT - 05/19/19 0815      General Information   Behavior/Cognition  Alert;Cooperative;Pleasant mood      Treatment Provided   Treatment provided  Cognitive-Linquistic      Pain Assessment   Pain Assessment  No/denies pain      Cognitive-Linquistic Treatment   Treatment focused on  Dysarthria    Skilled Treatment  Pt entered tx room with near-WNL volume. Loud /a/ to recalibrate pt's loudness in conversation and structured tasks: initial 2 /a/ averaged 82 dB, pt aware: "That's not loud enough." With min cues subsequent 3 /a/ averaged 90dB. Short rushes of speech in spontaneous conversation, so SLP targeted pt's rate of speech by having pt generate sentences describing pictures. Pt aware of 2/3 short rushes of speech in this task, reattempted but ultimately required SLP modeling to successfully  slow rate. Patient generated sentences in response to simple/automatic questions (reduced cognitive load) with greater success (90% for acceptable rate). Progressed to simple conversation; usual mod cues required for carryover of rate. In scripture reading, patient pausing appropriately but without renewing breath. SLP cued patient at each pause to take a "sip" of air. Usual mod cues, fading to supervision only with final 3 verses.      Assessment / Recommendations / Plan   Plan  Continue with current plan of care      Progression Toward Goals   Progression toward goals  Progressing toward goals       SLP Education - 05/19/19 0850    Education Details  renew breath when pausing    Person(s) Educated  Patient    Methods  Explanation;Verbal cues    Comprehension  Verbalized  understanding;Returned demonstration       SLP Short Term Goals - 05/19/19 0851      SLP SHORT TERM GOAL #1   Title  pt will produce loud /a/or "Hey-ah" at average low 90s dB over 4 sessions    Baseline  04-19-19    Status  Partially Met      SLP SHORT TERM GOAL #2   Title  pt will generate 18/20 sentences with WNL average volume over 3 sessions    Baseline  04-07-19, 04-14-19; 04/21/19    Status  Achieved      SLP SHORT TERM GOAL #3   Title  pt will use abdominal breathing at rest 85% success over 2 sessions    Status  Deferred   focus on slowing rate and incr volume     SLP SHORT TERM GOAL #4   Title  In 5 minutes simple conversation pt will achieve average speech volume of low 70s dB over two sessions    Status  Deferred   due to pt limited concern over volume      SLP Long Term Goals - 05/19/19 0851      SLP LONG TERM GOAL #1   Title  Pt will generate loud /a/ with average of low 90s dB over total of 6 sessions    Baseline  04-19-19, 04-28-19, 05-03-19    Time  2    Period  Weeks   or 17 sessions, for all LTGs   Status  On-going      SLP LONG TERM GOAL #2   Title  pt will use abdominal breathing 70% of the time in 8 minutes simple-mod complex conversation    Time  2    Period  Weeks    Status  On-going      SLP LONG TERM GOAL #3   Title  In 10 minutes simple-mod complex conversation pt will achieve average speech volume of low 70s dB over four sessions    Status  Deferred   pt is not concerned about volume     SLP LONG TERM GOAL #4   Title  pt will self correct 50% of the time when necessary, in 8 minutes simple conversation over 3 sessions    Time  2    Period  Weeks    Status  On-going       Plan - 05/19/19 0851    Clinical Impression Statement  Pt presents today with 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 Pt cont with more success in reading when he spontaneously  slows his rate. Pt  is improving in ID'ing fast rate of speech in both reading and less so in conversation, however has little success with actually slowing rate without SLP assistance. SLP believes pt will cont to benefit from skilled ST targeting incr'd volume and greater usage of abdominal breathing in order to improve communicative effectiveness.    Speech Therapy Frequency  2x / week    Treatment/Interventions  SLP instruction and feedback;Compensatory strategies;Patient/family education;Functional tasks;Cueing hierarchy;Multimodal communcation approach;Environmental controls;Aspiration precaution training;Pharyngeal strengthening exercises;Diet toleration management by SLP    Potential to Achieve Goals  Good    Consulted and Agree with Plan of Care  Patient       Patient will benefit from skilled therapeutic intervention in order to improve the following deficits and impairments:   Dysarthria and anarthria    Problem List Patient Active Problem List   Diagnosis Date Noted  . Parkinson's disease ([place]) 02/03/2018  . Community acquired pneumonia of left lower lobe of lung 12/23/2017  . Sepsis ([place]) 12/23/2017  . Morbid obesity due to excess calories ([place]) 08/23/2016  . OSA on CPAP 08/23/2016  . Dyspnea on exertion 08/22/2016  . Umbilical hernia 78/71/8367  . Persistent atrial fibrillation ([place])   . Encounter for therapeutic drug monitoring 08/12/2013  . Long term current use of anticoagulant 07/30/2010  . COLONIC POLYPS 06/03/2010  . 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 GERD 06/03/2010  . HIATAL HERNIA 06/03/2010  . BENIGN PROSTATIC HYPERTROPHY, WITH OBSTRUCTION 06/03/2010  . PSORIASIS 06/03/2010   Deneise Lever, [place], [place] 05/19/2019, 8:51 AM  [facility] [address] [place], [place], [address] Phone: [phone]   Fax:  [phone]   Name: Stanley Wright MRN:  [id number] Date of Birth: 07-04-1945

## 2019-05-20 NOTE — Therapy (Signed)
[facility] [address] [place], [place], [address] Phone: [phone]   Fax:  [phone]  Physical Therapy Treatment  Patient Details  Name: Stanley Wright MRN: [id number] Date of Birth: [birth year] Referring Provider (PT): Tat, Wells Guiles   Encounter Date: 05/19/2019  PT End of Session - 05/20/19 1300    Visit Number  11    Number of Visits  17    Date for PT Re-Evaluation  06/23/19    Authorization Type  [employer]-10th visit progress note required    PT Start Time  0853    PT Stop Time  0931    PT Time Calculation (min)  38 min    Activity Tolerance  Patient tolerated treatment well   Pt rates pain consistently with gait as 6-7/10   Behavior During Therapy  [facility] for tasks assessed/performed       Past Medical History:  Diagnosis Date  . Arthritis   . BENIGN PROSTATIC HYPERTROPHY, WITH OBSTRUCTION   . Cancer (HCC)    skin, basal, squamous  . COLONIC POLYPS   . Diverticulitis   . DVT (deep venous thrombosis) ([place]) 2000  . Dysrhythmia    Hx Afib- 2017  . Factor 5 Leiden mutation, heterozygous ([place])   . GERD (gastroesophageal reflux disease)    not on medication  . HIATAL HERNIA   . ITP (idiopathic thrombocytopenic purpura) 2003  . Long term current use of anticoagulant   . Parkinson's disease ([place]) 02/03/2018  . PSORIASIS   . Pulmonary embolus ([place]) 2000  . Shortness of breath dyspnea    at times - Talking alot  . Sleep apnea     Past Surgical History:  Procedure Laterality Date  . CARDIOVERSION N/A 11/22/2015   Procedure: CARDIOVERSION;  Surgeon: Sanda Klein, MD;  [facility];  Service: Cardiovascular;  Laterality: N/A;  . COLONOSCOPY    . HERNIA REPAIR Left    Inguinal- x2 . mesh x1  . INSERTION OF MESH N/A 02/25/2016   Procedure: INSERTION OF MESH;  Surgeon: Rolm Bookbinder, MD;  Location: [facility];  Service: General;  Laterality: N/A;  . LUNG REMOVAL, PARTIAL Right 2004    was fungal and not cancerous  . PROSTATE SURGERY    . UMBILICAL HERNIA REPAIR N/A 02/25/2016   Procedure: LAPAROSCOPIC UMBILICAL HERNIA REPAIR;  Surgeon: Rolm Bookbinder, MD;  Location: [facility];  Service: General;  Laterality: N/A;    There were no vitals filed for this visit.  Subjective Assessment - 05/19/19 0854    Subjective  Had some pain that was stabbing when I left the other day.  Need help to improve getting in and out of the car.  Harder getting out.    Pertinent History  Parkinson's disease; CHF    Patient Stated Goals  Pt's goal for PT is to get back to increased movement confidence.    Currently in Pain?  Yes    Pain Score  6    worse to 6-7/10 with activity.   Pain Location  Foot    Pain Orientation  Right    Pain Descriptors / Indicators  Aching;Stabbing    Pain Type  Chronic pain    Pain Onset  More than a month ago    Pain Frequency  Intermittent    Aggravating Factors   walking    Pain Relieving Factors  sitting; stopping activity  [facility] Adult PT Treatment/Exercise - 05/20/19 1256      Self-Care   Self-Care  Other Self-Care Comments    Other Self-Care Comments   Discussed options for continued fitness upon d/c from PT (next week)-provided information for [employer] Parkinson's online resources, including PD [employer]-to use low/moderated intensity/seated workouts as able as options for exercise in addition to HEP.      Therapeutic Activites    Therapeutic Activities  Other Therapeutic Activities    Other Therapeutic Activities  Simulated car transfer, using varied verbal cues and assistive devices for ease of transfers      At edge of 16" mat table, practiced seated marching, seated step out and in (single leg), then seated step out/out, in/in, at least 5 reps each leg.  PT provides cues for slowed pace and for increased step height/foot clearance.  Added seated rocking through hips to help with lifting R  foot.  Additional practiced including use of leg lifter, then belt, to simulate lifting RLE out of and into car, at least 5 reps each.  Practiced use of transfer disc under buttocks to ease turning for transfers.  PT provides cues for stepping onto lip of car, and using that to push through to lift other leg farther in.  Discussed options of how to order these if interested; use of satin pillow case on seat of car for improved ease of sliding/getting into and out of seat of car.    Discussed if pt is using walking poles or Ustep rollator at home-pt states he is using U-step rollator all the time.  Discussed safety of assistive devices to help take some of the pressure, pain off of R foot.   PT Education - 05/20/19 1259    Education Details  Provided information on ease of car transfers-including potential use of leg lifter or transfer disk; [employer] online resources-including [employer] Fr[employer] PD [employer]) Educated  Patient    Methods  Explanation;Handout;Demonstration    Comprehension  Verbalized understanding;Returned demonstration;Verbal cues required       PT Short Term Goals - 04/22/19 1444      PT SHORT TERM GOAL #1   Title  Pt will be independent with HEP for improved balance, transfers, gait.  TARGET 04/22/2019    Time  4    Period  Weeks    Status  Partially Met    Target Date  04/22/19      PT SHORT TERM GOAL #2   Title  Pt will improve 5x sit<>stand test to less than or equal to 13 seconds for improved transfer efficiency and safety.    Baseline  with UE support, 11.4 seconds 04/19/2019    Time  4    Period  Weeks    Status  Achieved    Target Date  04/22/19      PT SHORT TERM GOAL #3   Title  The patient will perform 360 degree turn in less than 5 seconds for improved turn efficiency and safety.    Baseline  R 6.13 sec, 8 sec    Time  4    Period  Weeks    Status  Not Met    Target Date  04/22/19      PT SHORT TERM GOAL #4   Title  Pt  will verbalize/demo understanding of tips to reduce festination with turns and gait.    Baseline  Pt able to verbalize, but needs cues for full demo.  Time  4    Period  Weeks    Status  Partially Met    Target Date  04/22/19        PT Long Term Goals - 03/25/19 1306      PT LONG TERM GOAL #1   Title  Pt will verbalize/demonstrate understanding of lifting/carrying objects with bilateral UEs, for improved safety with gait.    Time  8    Period  Weeks    Status  New    Target Date  05/20/19      PT LONG TERM GOAL #2   Title  Pt will improve MiniBESTest score to at least 22/28 for decreased fall risk.    Time  8    Period  Weeks    Status  New    Target Date  05/20/19      PT LONG TERM GOAL #3   Title  Pt will improve TUG/TUG cognitive score to </= 10% difference for improved dual tasking.    Time  8    Period  Weeks    Status  New    Target Date  05/20/19            Plan - 05/20/19 1301    Clinical Impression Statement  Per pt request, focused on car transfers (simulating car transfer at low mat surface), including education on use of adaptive equipment to help wtih lifting leg and general ease of transfer into/out of car.  Provided information on options for PD-appropriate exercise for home upon d/c from PT.  Pt in agreement with plans for PT discharge next week, as foot pain is limiting further progression of standing and gait activities.    PT Frequency  2x / week    PT Duration  8 weeks   plus eval   PT Treatment/Interventions  ADLs/Self Care Home Management;DME Instruction;Balance training;Therapeutic exercise;Therapeutic activities;Functional mobility training;Gait training;Neuromuscular re-education;Patient/family education    PT Next Visit Plan  Check LTGs and plan for d/c.    Consulted and Agree with Plan of Care  Patient       Patient will benefit from skilled therapeutic intervention in order to improve the following deficits and impairments:  Abnormal  gait, Decreased balance, Decreased mobility, Decreased strength, Postural dysfunction, Difficulty walking  Visit Diagnosis: Other symptoms and signs involving the nervous system  Unsteadiness on feet     Problem List Patient Active Problem List   Diagnosis Date Noted  . Parkinson's disease (HCC) 02/03/2018  . Community acquired pneumonia of left lower lobe of lung 12/23/2017  . Sepsis (HCC) 12/23/2017  . Morbid obesity due to excess calories ([facility]) 08/23/2016  . OSA on CPAP 08/23/2016  . Dyspnea on exertion 08/22/2016  . Umbilical hernia 02/25/2016  . Persistent atrial fibrillation ([facility])   . Encounter for therapeutic drug monitoring 08/12/2013  . Long term current use of anticoagulant 07/30/2010  . COLONIC POLYPS 06/03/2010  . 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 GERD 06/03/2010  . HIATAL HERNIA 06/03/2010  . BENIGN PROSTATIC HYPERTROPHY, WITH OBSTRUCTION 06/03/2010  . PSORIASIS 06/03/2010    MARRIOTT,AMY W. 05/20/2019, 1:04 PM  MARRIOTT,AMY W., PT   [facility] [address] [place], [place], [address] Phone: [phone]   Fax:  [phone]  Name: Stanley Wright MRN: [id number] Date of Birth: 04/20/1946   

## 2019-05-24 ENCOUNTER — Encounter: Payer: Self-pay | Admitting: Occupational Therapy

## 2019-05-24 ENCOUNTER — Ambulatory Visit: Payer: PPO

## 2019-05-24 ENCOUNTER — Ambulatory Visit: Payer: PPO | Admitting: Physical Therapy

## 2019-05-24 ENCOUNTER — Other Ambulatory Visit: Payer: Self-pay

## 2019-05-24 ENCOUNTER — Ambulatory Visit: Payer: PPO | Admitting: Occupational Therapy

## 2019-05-24 DIAGNOSIS — R2681 Unsteadiness on feet: Secondary | ICD-10-CM

## 2019-05-24 DIAGNOSIS — R29818 Other symptoms and signs involving the nervous system: Secondary | ICD-10-CM

## 2019-05-24 DIAGNOSIS — R471 Dysarthria and anarthria: Secondary | ICD-10-CM

## 2019-05-24 DIAGNOSIS — R278 Other lack of coordination: Secondary | ICD-10-CM

## 2019-05-24 DIAGNOSIS — R2689 Other abnormalities of gait and mobility: Secondary | ICD-10-CM

## 2019-05-24 NOTE — Therapy (Signed)
[facility] [address] [place], [place], [address] Phone: [phone]   Fax:  [phone]  Occupational Therapy Treatment  Patient Details  Name: Stanley Wright MRN: [id number] Date of Birth: [birth year] Referring Provider (OT): Dr. Carles Collet   Encounter Date: 05/24/2019  OT End of Session - 05/24/19 0938    Visit Number  10    Number of Visits  19    Date for OT Re-Evaluation  06/16/19    Authorization Type  [employer]    Authorization - Visit Number  10   progress report completed on 05/17/2019 (visit 9)   Authorization - Number of Visits  19    OT Start Time  0935    OT Stop Time  1015    OT Time Calculation (min)  40 min    Activity Tolerance  Patient tolerated treatment well    Behavior During Therapy  [facility] for tasks assessed/performed       Past Medical History:  Diagnosis Date  . Arthritis   . BENIGN PROSTATIC HYPERTROPHY, WITH OBSTRUCTION   . Cancer (HCC)    skin, basal, squamous  . COLONIC POLYPS   . Diverticulitis   . DVT (deep venous thrombosis) ([place]) 2000  . Dysrhythmia    Hx Afib- 2017  . Factor 5 Leiden mutation, heterozygous ([place])   . GERD (gastroesophageal reflux disease)    not on medication  . HIATAL HERNIA   . ITP (idiopathic thrombocytopenic purpura) 2003  . Long term current use of anticoagulant   . Parkinson's disease ([place]) 02/03/2018  . PSORIASIS   . Pulmonary embolus ([place]) 2000  . Shortness of breath dyspnea    at times - Talking alot  . Sleep apnea     Past Surgical History:  Procedure Laterality Date  . CARDIOVERSION N/A 11/22/2015   Procedure: CARDIOVERSION;  Surgeon: Sanda Klein, MD;  Location: [facility];  Service: Cardiovascular;  Laterality: N/A;  . COLONOSCOPY    . HERNIA REPAIR Left    Inguinal- x2 . mesh x1  . INSERTION OF MESH N/A 02/25/2016   Procedure: INSERTION OF MESH;  Surgeon: Rolm Bookbinder, MD;  Location: [facility];  Service: General;  Laterality: N/A;  .  LUNG REMOVAL, PARTIAL Right 2004   was fungal and not cancerous  . PROSTATE SURGERY    . UMBILICAL HERNIA REPAIR N/A 02/25/2016   Procedure: LAPAROSCOPIC UMBILICAL HERNIA REPAIR;  Surgeon: Rolm Bookbinder, MD;  Location: [facility];  Service: General;  Laterality: N/A;    There were no vitals filed for this visit.  Subjective Assessment - 05/24/19 0936    Subjective   I've got a bad foot    Currently in Pain?  Yes    Pain Score  7     Pain Location  Foot    Pain Orientation  Right    Pain Descriptors / Indicators  Aching;Stabbing    Pain Type  Chronic pain    Pain Onset  More than a month ago    Pain Frequency  Intermittent    Aggravating Factors   walking    Pain Relieving Factors  rest, use of walking poles for gait         Sitting, functional reaching to place large pegs in vertical pegboard incorporating wt. Shift, trunk rotation, lateral and overhead reaching with set-up/min v.c. for large amplitude movements.  PWR! Up in sitting with flipping large cards with min cueing for incr amplitude and dual task.   Closed-chain  shoulder flex (floor>overhead) and in diagonals (floor>overhead) for trunk rotation, wt shift and large amplitude UE movements.  Sitting, tossing ball with BUEs with focus, min-mod v.c. for incr movement amplitude/power (elbow ext, wrist/finger ext)  Simulated ADLs with bag with focus/min cues for large amplitude movements:  Donning/doffing pull-over shirt, donning/doffing pants with mod difficulty (modified), pulling bag into hand for clothing adjustment/donning socks, drying back.  Stacking blocks with BUEs simultaneously for bilateral hand coordination and large amplitude movements with min v.c.  Arm bike x71min level 1 for reciprocal movement with cues/target of at least 40rpms for intensity while maintaining movement amplitude/reciprocal movement.   Pt maintained 38 rpms          OT Long Term Goals - 05/17/19 0726      OT LONG TERM GOAL #1    Title  I with PD specific HEP.    Time  5    Period  Weeks    Status  On-going   reinforce coordination HEP, pt returned demo of PWR! modified quadraped.     OT LONG TERM GOAL #2   Title  Pt will demonstrate increased ease with fatening buttons as evidenced by decreasing 3 button/ un button test  to 45 secs or less     upgraded goal-Pt will consistently perform 3 button/ unbutton in 40 secs or less.    Baseline  53.03    Time  5    Period  Weeks    Status  Revised   42.15, 34.53     OT LONG TERM GOAL #3   Title  Pt will verbalzie understanding of adpated strategies for ADLs/IADLs. and ways to prevent future PD related complications.    Time  5    Period  Weeks    Status  On-going   needs reinforcement     OT LONG TERM GOAL #4   Title  Pt will demonstrate ability to write a sentence with 90% legibility and no significant decrease in letter size.    Baseline  50% legible sentence level    Time  5    Period  Weeks    Status  Achieved   100% legible with no significant decrease in letter size     OT LONG TERM GOAL #5   Title  Pt will report increased ease with fastening his seatbelt.    Time  5    Period  Weeks    Status  Achieved      OT LONG TERM GOAL #6   Title  Pt will increase bilateral shoulder flexion by 5* for increased ease with IADLs.    Baseline  RUE 125, LUE 120    Time  5    Period  Weeks    Status  Achieved   130 bilaterally           Plan - 05/24/19 0943    Clinical Impression Statement  Pt is progressing towards goals with improving movement amplitude for functional movement patterns.  However, pt continues to need min cueing.    OT Occupational Profile and History  Problem Focused Assessment - Including review of records relating to presenting problem    Occupational performance deficits (Please refer to evaluation for details):  ADL's;IADL's;Social Participation    Body Structure / Function / Physical Skills  ADL;UE functional  use;Balance;FMC;Gait;ROM;GMC;Coordination;Decreased knowledge of precautions;Decreased knowledge of use of DME;IADL;Strength;Mobility;Dexterity    Rehab Potential  Good    Clinical Decision Making  Limited treatment options, no task modification necessary  Comorbidities Affecting Occupational Performance:  May have comorbidities impacting occupational performance    Modification or Assistance to Complete Evaluation   No modification of tasks or assist necessary to complete eval    OT Frequency  2x / week    OT Duration  --   5 weeks plus eval   OT Treatment/Interventions  Self-care/ADL training;Ultrasound;Energy conservation;Aquatic Therapy;DME and/or AE instruction;Patient/family education;Balance training;Passive range of motion;Paraffin;Gait Training;Fluidtherapy;Cryotherapy;Therapist, nutritional;Therapeutic exercise;Moist Heat;Manual Therapy;Therapeutic activities;Cognitive remediation/compensation;Neuromuscular education    Plan  continue big movements with functional activity/ADLs, reinforce HEP.    Consulted and Agree with Plan of Care  Patient       Patient will benefit from skilled therapeutic intervention in order to improve the following deficits and impairments:   Body Structure / Function / Physical Skills: ADL, UE functional use, Balance, FMC, Gait, ROM, GMC, Coordination, Decreased knowledge of precautions, Decreased knowledge of use of DME, IADL, Strength, Mobility, Dexterity       Visit Diagnosis: Other symptoms and signs involving the nervous system  Unsteadiness on feet  Dysarthria and anarthria  Other abnormalities of gait and mobility  Other lack of coordination    Problem List Patient Active Problem List   Diagnosis Date Noted  . Parkinson's disease ([place]) 02/03/2018  . Community acquired pneumonia of left lower lobe of lung 12/23/2017  . Sepsis ([place]) 12/23/2017  . Morbid obesity due to excess calories ([place]) 08/23/2016  . OSA on CPAP 08/23/2016   . Dyspnea on exertion 08/22/2016  . Umbilical hernia [id number]  . Persistent atrial fibrillation ([place])   . Encounter for therapeutic drug monitoring 08/12/2013  . Long term current use of anticoagulant 07/30/2010  . COLONIC POLYPS 06/03/2010  . 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 GERD 06/03/2010  . HIATAL HERNIA 06/03/2010  . BENIGN PROSTATIC HYPERTROPHY, WITH OBSTRUCTION 06/03/2010  . PSORIASIS 06/03/2010    [facility] 05/24/2019, 2:12 PM  [facility] [address] [place], [place], [address] Phone: [phone]   Fax:  [phone]  Name: TUSTIN BOARD MRN: [id number] Date of Birth: [birth year]   Vianne Bulls, OTR/L [facility] [address] [place], [place]  [address] [phone] phone [phone] 05/24/19 2:12 PM

## 2019-05-24 NOTE — Therapy (Signed)
[facility] [address] [place], [place], [address] Phone: [phone]   Fax:  [phone]  Physical Therapy Treatment  Patient Details  Name: Stanley Wright MRN: [id number] Date of Birth: 10/31/1945 Referring Provider (PT): Tat, Wells Guiles   Encounter Date: 05/24/2019  PT End of Session - 05/24/19 1703    Visit Number  12    Number of Visits  17    Date for PT Re-Evaluation  06/23/19    Authorization Type  [employer]-10th visit progress note required    PT Start Time  0846    PT Stop Time  0926    PT Time Calculation (min)  40 min    Activity Tolerance  Patient tolerated treatment well;Patient limited by pain   Pt rates pain consistently with gait as 6-7/10   Behavior During Therapy  [facility] for tasks assessed/performed       Past Medical History:  Diagnosis Date  . Arthritis   . BENIGN PROSTATIC HYPERTROPHY, WITH OBSTRUCTION   . Cancer (HCC)    skin, basal, squamous  . COLONIC POLYPS   . Diverticulitis   . DVT (deep venous thrombosis) ([place]) 2000  . Dysrhythmia    Hx Afib- 2017  . Factor 5 Leiden mutation, heterozygous ([place])   . GERD (gastroesophageal reflux disease)    not on medication  . HIATAL HERNIA   . ITP (idiopathic thrombocytopenic purpura) 2003  . Long term current use of anticoagulant   . Parkinson's disease ([place]) 02/03/2018  . PSORIASIS   . Pulmonary embolus ([place]) 2000  . Shortness of breath dyspnea    at times - Talking alot  . Sleep apnea     Past Surgical History:  Procedure Laterality Date  . CARDIOVERSION N/A 11/22/2015   Procedure: CARDIOVERSION;  Surgeon: Sanda Klein, MD;  Location[facility];  Service: Cardiovascular;  Laterality: N/A;  . COLONOSCOPY    . HERNIA REPAIR Left    Inguinal- x2 . mesh x1  . INSERTION OF MESH N/A 02/25/2016   Procedure: INSERTION OF MESH;  Surgeon: Rolm Bookbinder, MD;  Location: [place];  Service: General;  Laterality: N/A;  . LUNG  REMOVAL, PARTIAL Right 2004   was fungal and not cancerous  . PROSTATE SURGERY    . UMBILICAL HERNIA REPAIR N/A 02/25/2016   Procedure: LAPAROSCOPIC UMBILICAL HERNIA REPAIR;  Surgeon: Rolm Bookbinder, MD;  Location: [place];  Service: General;  Laterality: N/A;    There were no vitals filed for this visit.  Subjective Assessment - 05/24/19 0849    Subjective  Hurts every step I take now.  Waiting to hear from the doctor about the foot surgery scheduling.  To have Botox to eyelids Friday.    Pertinent History  Parkinson's disease; CHF    Patient Stated Goals  Pt's goal for PT is to get back to increased movement confidence.    Currently in Pain?  Yes    Pain Score  7     Pain Location  Foot    Pain Orientation  Right    Pain Descriptors / Indicators  Aching;Stabbing    Pain Type  Chronic pain    Pain Onset  More than a month ago    Pain Frequency  Intermittent    Aggravating Factors   walking    Pain Relieving Factors  rest, use of walking poles for gait  [facility] Adult PT Treatment/Exercise - 05/24/19 0001      Transfers   Transfers  Sit to Stand;Stand to Sit    Sit to Stand  5: Supervision;With upper extremity assist;Without upper extremity assist;From chair/3-in-1;From bed    Stand to Sit  5: Supervision;Without upper extremity assist;To chair/3-in-1    Comments  Sit<>stand x 5 reps,followed by PWR! Up posture for improved functional lower extremity strength.      High Level Balance   High Level Balance Comments  MiniBESTest:  22/30 (improved from 20/30 at eval)      Self-Care   Self-Care  Other Self-Care Comments    Other Self-Care Comments   Discussed progress towards goals, plans for d/c this visit; discussed pt's plans for continued fitness (HEP, which pt verbalizes), walking for exercise, PT reiterates [employer] Fridays and use of his [employer] app.  Discussed options for return to therapy in 6-9 months.      Knee/Hip Exercises: Aerobic    Stepper  SciFit, Level 2.5, 4 extremities; baseline RPM 59-62; with cues to increase intensity, pt increases and maintains RPM at >80 x 5 minutes   1 additional minute of cool-down, RPM 50-60      Mini-BESTest: Balance Evaluation Systems Test  2005-2013 [employer]. All rights reserved. ________________________________________________________________________________________Anticipatory_________Subscore___2__/6 1. SIT TO STAND Instruction: "Cross your arms across your chest. Try not to use your hands unless you must.Do not let your legs lean against the back of the chair when you stand. Please stand up now." (2) Normal: Comes to stand without use of hands and stabilizes independently. C(1) Moderate: Comes to stand WITH use of hands on first attempt. (0) Severe: Unable to stand up from chair without assistance, OR needs several attempts with use of hands. 2. RISE TO TOES Instruction: "Place your feet shoulder width apart. Place your hands on your hips. Try to rise as high as you can onto your toes. I will count out loud to 3 seconds. Try to hold this pose for at least 3 seconds. Look straight ahead. Rise now." (2) Normal: Stable for 3 s with maximum height. X(1) Moderate: Heels up, but not full range (smaller than when holding hands), OR noticeable instability for 3 s. (0) Severe: < 3 s. 3. STAND ON ONE LEG Instruction: "Look straight ahead. Keep your hands on your hips. Lift your leg off of the ground behind you without touching or resting your raised leg upon your other standing leg. Stay standing on one leg as long as you can. Look straight ahead. Lift now." Left: Time in Seconds Trial 1:__4.5___Trial 2:__3.72___ (2) Normal: 20 s. X(1) Moderate: < 20 s. (0) Severe: Unable. Right: Time in Seconds Trial 1:__0.46___Trial 2:__0.50___ (2) Normal: 20 s. (1) Moderate: < 20 s. X(0) Severe: Unable To score each side separately use the trial with the longest time. To  calculate the sub-score and total score use the side [left or right] with the lowest numerical score [i.e. the worse side]. ______________________________________________________________________________________Reactive Postural Control___________Subscore:____6_/6 4. COMPENSATORY STEPPING CORRECTION- FORWARD Instruction: "Stand with your feet shoulder width apart, arms at your sides. Lean forward against my hands beyond your forward limits. When I let go, do whatever is necessary, including taking a step, to avoid a fall." X(2) Normal: Recovers independently with a single, large step (second realignment step is allowed). (1) Moderate: More than one step used to recover equilibrium. (0) Severe: No step, OR would fall if not caught, OR falls spontaneously. 5. COMPENSATORY STEPPING CORRECTION- BACKWARD Instruction: "  Stand with your feet shoulder width apart, arms at your sides. Lean backward against my hands beyond your backward limits. When I let go, do whatever is necessary, including taking a step, to avoid a fall." X(2) Normal: Recovers independently with a single, large step. (1) Moderate: More than one step used to recover equilibrium. (0) Severe: No step, OR would fall if not caught, OR falls spontaneously. 6. COMPENSATORY STEPPING CORRECTION- LATERAL Instruction: "Stand with your feet together, arms down at your sides. Lean into my hand beyond your sideways limit. When I let go, do whatever is necessary, including taking a step, to avoid a fall." Left X(2) Normal: Recovers independently with 1 step (crossover or lateral OK). (1) Moderate: Several steps to recover equilibrium. (0) Severe: Falls, or cannot step. Right X(2) Normal: Recovers independently with 1 step (crossover or lateral OK). (1) Moderate: Several steps to recover equilibrium. (0) Severe: Falls, or cannot step. Use the side with the lowest score to calculate sub-score and total  score. ____________________________________________________________________________________Sensory Orientation_____________Subscore:________6_/6 7. STANCE (FEET TOGETHER); EYES OPEN, FIRM SURFACE Instruction: "Place your hands on your hips. Place your feet together until almost touching. Look straight ahead. Be as stable and still as possible, until I say stop." Time in seconds:________ X(2) Normal: 30 s. (1) Moderate: < 30 s. (0) Severe: Unable. 8. STANCE (FEET TOGETHER); EYES CLOSED, FOAM SURFACE Instruction: "Step onto the foam. Place your hands on your hips. Place your feet together until almost touching. Be as stable and still as possible, until I say stop. I will start timing when you close your eyes." Time in seconds:________ X(2) Normal: 30 s. (1) Moderate: < 30 s. (0) Severe: Unable. 9. INCLINE- EYES CLOSED Instruction: "Step onto the incline ramp. Please stand on the incline ramp with your toes toward the top. Place your feet shoulder width apart and have your arms down at your sides. I will start timing when you close your eyes." Time in seconds:________ X(2) Normal: Stands independently 30 s and aligns with gravity. (1) Moderate: Stands independently <30 s OR aligns with surface. (0) Severe: Unable. _________________________________________________________________________________________Dynamic Gait ______Subscore______8__/10 10. CHANGE IN GAIT SPEED Instruction: "Begin walking at your normal speed, when I tell you 'fast', walk as fast as you can. When I say 'slow', walk very slowly." X(2) Normal: Significantly changes walking speed without imbalance. (1) Moderate: Unable to change walking speed or signs of imbalance. (0) Severe: Unable to achieve significant change in walking speed AND signs of imbalance. [place] - HORIZONTAL Instruction: "Begin walking at your normal speed, when I say "right", turn your head and look to the right. When I say "left" turn  your head and look to the left. Try to keep yourself walking in a straight line." X(2) Normal: performs head turns with no change in gait speed and good balance. (1) Moderate: performs head turns with reduction in gait speed. (0) Severe: performs head turns with imbalance. 12. WALK WITH PIVOT TURNS Instruction: "Begin walking at your normal speed. When I tell you to 'turn and stop', turn as quickly as you can, face the opposite direction, and stop. After the turn, your feet should be close together." (2) Normal: Turns with feet close FAST (< 3 steps) with good balance. X(1) Moderate: Turns with feet close SLOW (>4 steps) with good balance. (0) Severe: Cannot turn with feet close at any speed without imbalance. 13. STEP OVER OBSTACLES Instruction: "Begin walking at your normal speed. When you get to the box, step over it,  not around it and keep walking." X(2) Normal: Able to step over box with minimal change of gait speed and with good balance. (1) Moderate: Steps over box but touches box OR displays cautious behavior by slowing gait. (0) Severe: Unable to step over box OR steps around box. 14. TIMED UP & GO WITH DUAL TASK [3 METER WALK] Instruction TUG: "When I say 'Go', stand up from chair, walk at your normal speed across the tape on the floor, turn around, and come back to sit in the chair." Instruction TUG with Dual Task: "Count backwards by threes starting at ___. When I say 'Go', stand up from chair, walk at your normal speed across the tape on the floor, turn around, and come back to sit in the chair. Continue counting backwards the entire time." TUG: ________seconds; Dual Task TUG: ________seconds (2) Normal: No noticeable change in sitting, standing or walking while backward counting when compared to TUG without Dual Task. X(1) Moderate: Dual Task affects either counting OR walking (>10%) when compared to the TUG without Dual Task. (0) Severe: Stops counting while walking OR stops  walking while counting. When scoring item 14, if subject's gait speed slows more than 10% between the TUG without and with a Dual Task the score should be decreased by a point. TOTAL SCORE: _____22___/28        PT Education - 05/24/19 1702    Education Details  Progress towards goals, plans for d/c this visit; plans for return PT eval in 6-9 months.    Person(s) Educated  Patient    Methods  Explanation    Comprehension  Verbalized understanding       PT Short Term Goals - 04/22/19 1444      PT SHORT TERM GOAL #1   Title  Pt will be independent with HEP for improved balance, transfers, gait.  TARGET 04/22/2019    Time  4    Period  Weeks    Status  Partially Met    Target Date  04/22/19      PT SHORT TERM GOAL #2   Title  Pt will improve 5x sit<>stand test to less than or equal to 13 seconds for improved transfer efficiency and safety.    Baseline  with UE support, 11.4 seconds 04/19/2019    Time  4    Period  Weeks    Status  Achieved    Target Date  04/22/19      PT SHORT TERM GOAL #3   Title  The patient will perform 360 degree turn in less than 5 seconds for improved turn efficiency and safety.    Baseline  R 6.13 sec, 8 sec    Time  4    Period  Weeks    Status  Not Met    Target Date  04/22/19      PT SHORT TERM GOAL #4   Title  Pt will verbalize/demo understanding of tips to reduce festination with turns and gait.    Baseline  Pt able to verbalize, but needs cues for full demo.    Time  4    Period  Weeks    Status  Partially Met    Target Date  04/22/19        PT Long Term Goals - 05/24/19 1705      PT LONG TERM GOAL #1   Title  Pt will verbalize/demonstrate understanding of lifting/carrying objects with bilateral UEs, for improved safety with gait.    Baseline  Verbalizes understanding; at times, does not demonstrate side carry technique    Time  8    Period  Weeks    Status  Partially Met      PT LONG TERM GOAL #2   Title  Pt will improve  MiniBESTest score to at least 22/28 for decreased fall risk.    Time  8    Period  Weeks    Status  Achieved      PT LONG TERM GOAL #3   Title  Pt will improve TUG/TUG cognitive score to </= 10% difference for improved dual tasking.    Time  8    Period  Weeks    Status  Not Met            Plan - 05/24/19 1705    Clinical Impression Statement  Assessed LTGs this visit, with pt partially meeting LTG 1, meeting LTG 2 for MiniBestest score, not meeting LTG for TUG/TUG cognitive scores <10% difference.  Pt has been educated in and practiced Parkinson's specific exercises and techniques to reduce freezing and festination with gait.  Pt responds well during sesion to cues for these techniques and to slow pace with mobility; however, he continues to demonstrate festination at times when cues removed.  Foot pain has been a limiting factor in his mobility at this time, and he is to have foot surgery scheduled with his orthopedist.  PT has recommneded use of U-step rolling walker or bilateral walking poles to help with stability with gait due to his antalgic gait pattern.  He is appropriate for d/c from PT at this time.    PT Frequency  2x / week    PT Duration  8 weeks   plus eval   PT Treatment/Interventions  ADLs/Self Care Home Management;DME Instruction;Balance training;Therapeutic exercise;Therapeutic activities;Functional mobility training;Gait training;Neuromuscular re-education;Patient/family education    PT Next Visit Plan  D/C this visit.  Plan for return PT eval in 6-9 months.    Consulted and Agree with Plan of Care  Patient       Patient will benefit from skilled therapeutic intervention in order to improve the following deficits and impairments:  Abnormal gait, Decreased balance, Decreased mobility, Decreased strength, Postural dysfunction, Difficulty walking  Visit Diagnosis: Other abnormalities of gait and mobility  Unsteadiness on feet  Other symptoms and signs involving  the nervous system     Problem List Patient Active Problem List   Diagnosis Date Noted  . Parkinson's disease ([place]) 02/03/2018  . Community acquired pneumonia of left lower lobe of lung 12/23/2017  . Sepsis ([place]) 12/23/2017  . Morbid obesity due to excess calories ([place]) 08/23/2016  . OSA on CPAP 08/23/2016  . Dyspnea on exertion 08/22/2016  . Umbilical hernia 81/82/9937  . Persistent atrial fibrillation ([place])   . Encounter for therapeutic drug monitoring 08/12/2013  . Long term current use of anticoagulant 07/30/2010  . COLONIC POLYPS 06/03/2010  . 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 GERD 06/03/2010  . HIATAL HERNIA 06/03/2010  . BENIGN PROSTATIC HYPERTROPHY, WITH OBSTRUCTION 06/03/2010  . PSORIASIS 06/03/2010    Williard Keller W. 05/24/2019, 5:10 PM  Frazier Butt., PT   [facility] [address] [place], [place], [address] Phone: [phone]   Fax:  [phone]  Name: JOSEANGEL NETTLETON MRN: [id number] Date of Birth: [birth year]   PHYSICAL THERAPY DISCHARGE SUMMARY  Visits from Start of Care: 12  Current functional level related to goals / functional outcomes: PT Long Term  Goals - 05/24/19 1705      PT LONG TERM GOAL #1   Title  Pt will verbalize/demonstrate understanding of lifting/carrying objects with bilateral UEs, for improved safety with gait.    Baseline  Verbalizes understanding; at times, does not demonstrate side carry technique    Time  8    Period  Weeks    Status  Partially Met      PT LONG TERM GOAL #2   Title  Pt will improve MiniBESTest score to at least 22/28 for decreased fall risk.    Time  8    Period  Weeks    Status  Achieved      PT LONG TERM GOAL #3   Title  Pt will improve TUG/TUG cognitive score to </= 10% difference for improved dual tasking.    Time  8    Period  Weeks    Status  Not Met         Remaining deficits: Festination of gait; decreased balance; foot pain  (being addressed by orthopedist)   Education / Equipment: Educated in [employer], tips to reduce freezing with gait and turns, online fitness options.  Plan: Patient agrees to discharge.  Patient goals were partially met. Patient is being discharged due to                                                     ?????awaiting foot surgery at this time.    Mady Haagensen, PT 05/24/19 5:17 PM Phone: [phone] Fax: [phone]

## 2019-05-24 NOTE — Therapy (Signed)
[facility] [address] [place], [place], [address] Phone: [phone]   Fax:  [phone]  Speech Language Pathology Treatment  Patient Details  Name: Stanley Wright MRN: [id number] Date of Birth: 05/26/1946 Referring Provider (SLP): Alonza Bogus, DO   Encounter Date: 05/24/2019  End of Session - 05/24/19 0950    Visit Number  14    Number of Visits  17    Date for SLP Re-Evaluation  06/22/19    SLP Start Time  0805    SLP Stop Time   0845    SLP Time Calculation (min)  40 min    Activity Tolerance  Patient tolerated treatment well       Past Medical History:  Diagnosis Date  . Arthritis   . BENIGN PROSTATIC HYPERTROPHY, WITH OBSTRUCTION   . Cancer (HCC)    skin, basal, squamous  . COLONIC POLYPS   . Diverticulitis   . DVT (deep venous thrombosis) ([place]) 2000  . Dysrhythmia    Hx Afib- 2017  . Factor 5 Leiden mutation, heterozygous ([place])   . GERD (gastroesophageal reflux disease)    not on medication  . HIATAL HERNIA   . ITP (idiopathic thrombocytopenic purpura) 2003  . Long term current use of anticoagulant   . Parkinson's disease ([place]) 02/03/2018  . PSORIASIS   . Pulmonary embolus ([place]) 2000  . Shortness of breath dyspnea    at times - Talking alot  . Sleep apnea     Past Surgical History:  Procedure Laterality Date  . CARDIOVERSION N/A 11/22/2015   Procedure: CARDIOVERSION;  Surgeon: Sanda Klein, MD;  [facility];  Service: Cardiovascular;  Laterality: N/A;  . COLONOSCOPY    . HERNIA REPAIR Left    Inguinal- x2 . mesh x1  . INSERTION OF MESH N/A 02/25/2016   Procedure: INSERTION OF MESH;  Surgeon: Rolm Bookbinder, MD;  Location: [facility];  Service: General;  Laterality: N/A;  . LUNG REMOVAL, PARTIAL Right 2004   was fungal and not cancerous  . PROSTATE SURGERY    . UMBILICAL HERNIA REPAIR N/A 02/25/2016   Procedure: LAPAROSCOPIC UMBILICAL HERNIA REPAIR;  Surgeon: Rolm Bookbinder,  MD;  Location: [facility];  Service: General;  Laterality: N/A;    There were no vitals filed for this visit.  Subjective Assessment - 05/24/19 0815    Subjective  "I had Mary." Pt with suboptimal    Currently in Pain?  Yes    Pain Score  7    when walking   Pain Location  Foot    Pain Orientation  Right    Pain Descriptors / Indicators  Aching;Stabbing    Pain Type  Chronic pain    Pain Onset  More than a month ago    Pain Frequency  Intermittent    Aggravating Factors   walking    Pain Relieving Factors  rest            ADULT SLP TREATMENT - 05/24/19 0836      General Information   Behavior/Cognition  Alert;Cooperative;Pleasant mood      Treatment Provided   Treatment provided  Cognitive-Linquistic      Cognitive-Linquistic Treatment   Treatment focused on  Dysarthria    Skilled Treatment  "You know, you and [facility] have given me what I need. Now It's up to me to do it." Pt wanted the last ST session to be 05-26-19; SLP asked pt to consider going another 1-2 weeks. Pt to  discuss with SLP on Thursday. Pt engaged in this discussion re: timing of d/c with suboptimal speech loudness in mid 60s dB. Speech rate of this conversation was functional. SLP began with loud "hey-ah" and pt generated mid 80s dB average. He stopped one rep and restarted due to too soft (pt was correct -prduction was in upper 70s dB).  Pt read scripture with excellent rate and loudness for first 2/3 and then had more difficulty with rate for the remaining 1/3 despite SLP occasional min-mod cues for slowing rate. Strained/strangled quality more prevalent the last 1/2 of the scripture passage. SLP asked pt questions with one-sentence resposes which req'd occasional min cues for rate and usual min-mod cues for loudness.      Assessment / Recommendations / Plan   Plan  Continue with current plan of care      Progression Toward Goals   Progression toward goals  Progressing toward goals         SLP Short Term  Goals - 05/19/19 0851      SLP SHORT TERM GOAL #1   Title  pt will produce loud /a/or "Hey-ah" at average low 90s dB over 4 sessions    Baseline  04-19-19    Status  Partially Met      SLP SHORT TERM GOAL #2   Title  pt will generate 18/20 sentences with WNL average volume over 3 sessions    Baseline  04-07-19, 04-14-19; 04/21/19    Status  Achieved      SLP SHORT TERM GOAL #3   Title  pt will use abdominal breathing at rest 85% success over 2 sessions    Status  Deferred   focus on slowing rate and incr volume     SLP SHORT TERM GOAL #4   Title  In 5 minutes simple conversation pt will achieve average speech volume of low 70s dB over two sessions    Status  Deferred   due to pt limited concern over volume      SLP Long Term Goals - 05/24/19 0953      SLP LONG TERM GOAL #1   Title  Pt will generate loud /a/ with average of low 90s dB over total of 6 sessions    Baseline  04-19-19, 04-28-19, 05-03-19    Time  1    Period  Weeks   or 17 sessions, for all LTGs   Status  On-going      SLP LONG TERM GOAL #2   Title  pt will use abdominal breathing 70% of the time in 8 minutes simple-mod complex conversation    Time  1    Period  Weeks    Status  On-going      SLP LONG TERM GOAL #3   Title  In 10 minutes simple-mod complex conversation pt will achieve average speech volume of low 70s dB over four sessions    Status  Deferred   pt is not concerned about volume     SLP LONG TERM GOAL #4   Title  pt will self correct 50% of the time when necessary, in 8 minutes simple conversation over 3 sessions    Time  1    Period  Weeks    Status  On-going       Plan - 05/24/19 0951    Clinical Impression Statement  Pt presents today with 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 Pt's improvement in ID'ing fast rate of speech in both reading and structured ST tasks, however has difficulty reducing  rate without SLP assistance. SLP believes pt will cont to benefit from skilled ST targeting incr'd volume and greater usage of abdominal breathing in order to improve communicative effectiveness.    Speech Therapy Frequency  2x / week    Duration  --   8 weeks or 17 sessions   Treatment/Interventions  SLP instruction and feedback;Compensatory strategies;Patient/family education;Functional tasks;Cueing hierarchy;Multimodal communcation approach;Environmental controls;Aspiration precaution training;Pharyngeal strengthening exercises;Diet toleration management by SLP    Potential to Achieve Goals  Good    Potential Considerations  Severity of impairments    Consulted and Agree with Plan of Care  Patient       Patient will benefit from skilled therapeutic intervention in order to improve the following deficits and impairments:   Dysarthria and anarthria    Problem List Patient Active Problem List   Diagnosis Date Noted  . Parkinson's disease ([place]) 02/03/2018  . Community acquired pneumonia of left lower lobe of lung 12/23/2017  . Sepsis ([place]) 12/23/2017  . Morbid obesity due to excess calories ([place]) 08/23/2016  . OSA on CPAP 08/23/2016  . Dyspnea on exertion 08/22/2016  . Umbilical hernia 69/43/7005  . Persistent atrial fibrillation ([place])   . Encounter for therapeutic drug monitoring 08/12/2013  . Long term current use of anticoagulant 07/30/2010  . COLONIC POLYPS 06/03/2010  . 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 GERD 06/03/2010  . HIATAL HERNIA 06/03/2010  . BENIGN PROSTATIC HYPERTROPHY, WITH OBSTRUCTION 06/03/2010  . PSORIASIS 06/03/2010    [facility] ,MS, CCC-SLP  05/24/2019, 9:53 AM  [facility] [address] [place], [place], [address] Phone: [phone]   Fax:  [phone]   Name: Stanley Wright MRN: [id number] Date of Birth: 26-Jun-1945

## 2019-05-26 ENCOUNTER — Other Ambulatory Visit: Payer: Self-pay

## 2019-05-26 ENCOUNTER — Ambulatory Visit: Payer: PPO

## 2019-05-26 DIAGNOSIS — R471 Dysarthria and anarthria: Secondary | ICD-10-CM

## 2019-05-26 DIAGNOSIS — H2513 Age-related nuclear cataract, bilateral: Secondary | ICD-10-CM | POA: Diagnosis not present

## 2019-05-26 DIAGNOSIS — H35033 Hypertensive retinopathy, bilateral: Secondary | ICD-10-CM | POA: Diagnosis not present

## 2019-05-26 DIAGNOSIS — D3132 Benign neoplasm of left choroid: Secondary | ICD-10-CM | POA: Diagnosis not present

## 2019-05-26 DIAGNOSIS — H524 Presbyopia: Secondary | ICD-10-CM | POA: Diagnosis not present

## 2019-05-26 DIAGNOSIS — H35363 Drusen (degenerative) of macula, bilateral: Secondary | ICD-10-CM | POA: Diagnosis not present

## 2019-05-26 NOTE — Therapy (Signed)
[facility] [address] [place], [place], [address] Phone: [phone]   Fax:  [phone]  Speech Language Pathology Treatment/Discharge Summary  Patient Details  Name: Stanley Wright MRN: [id number] Date of Birth: 03/07/1946 Referring Provider (SLP): Alonza Bogus, DO   Encounter Date: 05/26/2019  End of Session - 05/26/19 1019    Visit Number  15    Number of Visits  17    Date for SLP Re-Evaluation  06/22/19    SLP Start Time  0847    SLP Stop Time   0930    SLP Time Calculation (min)  43 min    Activity Tolerance  Patient tolerated treatment well;Patient limited by pain       Past Medical History:  Diagnosis Date  . Arthritis   . BENIGN PROSTATIC HYPERTROPHY, WITH OBSTRUCTION   . Cancer (HCC)    skin, basal, squamous  . COLONIC POLYPS   . Diverticulitis   . DVT (deep venous thrombosis) ([place]) 2000  . Dysrhythmia    Hx Afib- 2017  . Factor 5 Leiden mutation, heterozygous ([place])   . GERD (gastroesophageal reflux disease)    not on medication  . HIATAL HERNIA   . ITP (idiopathic thrombocytopenic purpura) 2003  . Long term current use of anticoagulant   . Parkinson's disease ([place]) 02/03/2018  . PSORIASIS   . Pulmonary embolus ([place]) 2000  . Shortness of breath dyspnea    at times - Talking alot  . Sleep apnea     Past Surgical History:  Procedure Laterality Date  . CARDIOVERSION N/A 11/22/2015   Procedure: CARDIOVERSION;  Surgeon: Sanda Klein, MD;  [facility];  Service: Cardiovascular;  Laterality: N/A;  . COLONOSCOPY    . HERNIA REPAIR Left    Inguinal- x2 . mesh x1  . INSERTION OF MESH N/A 02/25/2016   Procedure: INSERTION OF MESH;  Surgeon: Rolm Bookbinder, MD;  Location: [facility];  Service: General;  Laterality: N/A;  . LUNG REMOVAL, PARTIAL Right 2004   was fungal and not cancerous  . PROSTATE SURGERY    . UMBILICAL HERNIA REPAIR N/A 02/25/2016   Procedure: LAPAROSCOPIC UMBILICAL  HERNIA REPAIR;  Surgeon: Rolm Bookbinder, MD;  Location: [facility];  Service: General;  Laterality: N/A;    There were no vitals filed for this visit.  Subjective Assessment - 05/26/19 1013    Subjective  "I need to get this foot operated on." (one of pt's reasons not to cont with ST)    Currently in Pain?  Yes    Pain Score  7     Pain Location  Foot    Pain Orientation  Right    Pain Descriptors / Indicators  Aching            ADULT SLP TREATMENT - 05/26/19 1014      General Information   Behavior/Cognition  Alert;Cooperative;Pleasant mood      Treatment Provided   Treatment provided  Cognitive-Linquistic      Cognitive-Linquistic Treatment   Treatment focused on  Dysarthria    Skilled Treatment  "It's up to me to do what you all have taught me." Pt  with suboptimal speech loudness in mid 60s dB and incr'd speech rate, however was functional/intelligible. SLP began with loud "hey-ah" and pt generated mid-upper 80s dB average. SLP asked pt what he would like to work on today and he stated conversation, so SLP asked pt approx every 5 minutes how he did with keeping  a slow rate and proper loudness. 3 times during the short piceces of conversation pt stopped and restarted, saying "That wasn't good." He req'd occasional min A for rate, and usual min-mod A for loudness      Assessment / Recommendations / Plan   Plan  Discharge SLP treatment due to (comment)   pt request     Progression Toward Goals   Progression toward goals  --   d/c - see goals        SLP Short Term Goals - 05/19/19 0851      SLP SHORT TERM GOAL #1   Title  pt will produce loud /a/or "Hey-ah" at average low 90s dB over 4 sessions    Baseline  04-19-19    Status  Partially Met      SLP SHORT TERM GOAL #2   Title  pt will generate 18/20 sentences with WNL average volume over 3 sessions    Baseline  04-07-19, 04-14-19; 04/21/19    Status  Achieved      SLP SHORT TERM GOAL #3   Title  pt will use  abdominal breathing at rest 85% success over 2 sessions    Status  Deferred   focus on slowing rate and incr volume     SLP SHORT TERM GOAL #4   Title  In 5 minutes simple conversation pt will achieve average speech volume of low 70s dB over two sessions    Status  Deferred   due to pt limited concern over volume      SLP Long Term Goals - 05/26/19 1142      SLP LONG TERM GOAL #1   Title  Pt will generate loud /a/ with average of low 90s dB over total of 6 sessions    Baseline  04-19-19, 04-28-19, 05-03-19    Time  1    Period  --   or 17 sessions, for all LTGs   Status  Partially Met      SLP LONG TERM GOAL #2   Title  pt will use abdominal breathing 70% of the time in 8 minutes simple-mod complex conversation    Time  1    Status  Not Met      SLP LONG TERM GOAL #3   Title  In 10 minutes simple-mod complex conversation pt will achieve average speech volume of low 70s dB over four sessions    Status  Deferred   pt is not concerned about volume     SLP LONG TERM GOAL #4   Title  pt will self correct 50% of the time when necessary, in 8 minutes simple conversation over 3 sessions    Status  Not Met       Plan - 05/26/19 1138    Clinical Impression Statement  Pt presents today with 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 Pt would like to d/c today and concentrate on getting some relief for his lt foot. Suggested he return in approx 6 months for another eval if agreed by referring MD.    Treatment/Interventions  SLP instruction and feedback;Compensatory strategies;Patient/family education;Functional tasks;Cueing hierarchy;Multimodal communcation approach;Environmental controls;Aspiration precaution training;Pharyngeal strengthening exercises;Diet toleration management by SLP    Potential to Achieve Goals  Good    Potential Considerations  Severity of impairments    Consulted and Agree with  Plan of Care  Patient       Patient will benefit  from skilled therapeutic intervention in order to improve the following deficits and impairments:   Dysarthria and anarthria   SPEECH THERAPY DISCHARGE SUMMARY  Visits from Start of Care: 15  Current functional level related to goals / functional outcomes: Pt demonstrated mild success with slowing rate and some improvement on incr'ing loudness, however amount of success is decr'd since last 1-2 courses of therapy. SLP suggested continuing for 1-2 more weeks but pt wanted to d/c at this time to focus on other medical issues. See goals above for details.    Remaining deficits: Mod dysarthria c/b decr'd breath support, and rushed speech.   Education / Equipment: Loud /a/, need for cont'd practice.   Plan: Patient agrees to discharge.  Patient goals were not met. Patient is being discharged due to the patient's request.  ?????       Problem List Patient Active Problem List   Diagnosis Date Noted  . Parkinson's disease ([place]) 02/03/2018  . Community acquired pneumonia of left lower lobe of lung 12/23/2017  . Sepsis ([place]) 12/23/2017  . Morbid obesity due to excess calories ([place]) 08/23/2016  . OSA on CPAP 08/23/2016  . Dyspnea on exertion 08/22/2016  . Umbilical hernia 53/74/8270  . Persistent atrial fibrillation ([place])   . Encounter for therapeutic drug monitoring 08/12/2013  . Long term current use of anticoagulant 07/30/2010  . COLONIC POLYPS 06/03/2010  . 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 GERD 06/03/2010  . HIATAL HERNIA 06/03/2010  . BENIGN PROSTATIC HYPERTROPHY, WITH OBSTRUCTION 06/03/2010  . PSORIASIS 06/03/2010    [facility] ,MS, [place]  05/26/2019, 11:44 AM  [facility] [address] [place], [place], [address] Phone: [phone]   Fax:  [phone]   Name: Stanley Wright MRN: [id number] Date of Birth: 16-Mar-1946

## 2019-05-27 ENCOUNTER — Other Ambulatory Visit: Payer: Self-pay

## 2019-05-27 ENCOUNTER — Ambulatory Visit (INDEPENDENT_AMBULATORY_CARE_PROVIDER_SITE_OTHER): Payer: PPO | Admitting: Neurology

## 2019-05-27 DIAGNOSIS — R482 Apraxia: Secondary | ICD-10-CM

## 2019-05-27 DIAGNOSIS — G245 Blepharospasm: Secondary | ICD-10-CM | POA: Diagnosis not present

## 2019-05-27 MED ORDER — ONABOTULINUMTOXINA 100 UNITS IJ SOLR
25.0000 [IU] | Freq: Once | INTRAMUSCULAR | Status: AC
  Administered 2019-05-27: 25 [IU] via INTRAMUSCULAR

## 2019-05-27 NOTE — Procedures (Signed)
[facility]   Procedure Note Botox  Attending: Dr. 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  Risk discussed included, but were not limited pain and discomfort, bleeding, bruising, excessive weakness, venous thrombosis, muscle atrophy and drooping of eyelids.  A copy of the patient medication guide was given to the patient which explains the blackbox warning.  Patients identity and treatment sites confirmed Yes.  .  Details of Procedure: Skin was cleaned with alcohol.  A 30 gauge, 1/2 inch needle was introduced to the target muscle.  Prior to injection, the needle plunger was aspirated to make sure the needle was not within a blood vessel.  There was no blood retrieved on aspiration.    Following is a summary of the muscles injected  And the amount of Botulinum toxin used:   Dilution 0.9% preservative free saline mixed with 100 u Botox type A to make 5 U per 0.1cc (2 cc of saline used per 100 U botox)  Injections  Location Left  Right Units  Upper lateral orbicularis oculi  2.5 2.5 5.0  Upper medial orbicularis oculi      Lateral orbicularis oculi  5.0 5.0 10.0  Lower lateral orbicularis oculi  2.5 2.5 5.0  Lower medial orbicularis oculi 2.5 2.5 5.0  Trapezius          TOTAL UNITS:   25.0   Agent: Botulinum Type A ( Onobotulinum Toxin type A ).  1 vials of Botox were used, each containing 50 units and freshly diluted with 2 mL of sterile, non-perserved saline   Total injected (Units): 25  Total wasted (Units): 0   Pt tolerated procedure well without complications.   Reinjection is anticipated in 3 months.

## 2019-05-31 ENCOUNTER — Other Ambulatory Visit: Payer: Self-pay

## 2019-05-31 ENCOUNTER — Ambulatory Visit: Payer: PPO | Admitting: Occupational Therapy

## 2019-05-31 DIAGNOSIS — R2681 Unsteadiness on feet: Secondary | ICD-10-CM

## 2019-05-31 DIAGNOSIS — R278 Other lack of coordination: Secondary | ICD-10-CM

## 2019-05-31 DIAGNOSIS — R471 Dysarthria and anarthria: Secondary | ICD-10-CM | POA: Diagnosis not present

## 2019-05-31 DIAGNOSIS — R2689 Other abnormalities of gait and mobility: Secondary | ICD-10-CM

## 2019-05-31 DIAGNOSIS — R29818 Other symptoms and signs involving the nervous system: Secondary | ICD-10-CM

## 2019-05-31 NOTE — Patient Instructions (Signed)

## 2019-05-31 NOTE — Therapy (Signed)
[facility] [address] [place], [place], [address] Phone: [phone]   Fax:  [phone]  Occupational Therapy Treatment  Patient Details  Name: Stanley Wright MRN: [id number] Date of Birth: [birth year] Referring Provider (OT): Dr. Carles Collet   Encounter Date: 05/31/2019  OT End of Session - 05/31/19 0812    Visit Number  11    Number of Visits  19    Date for OT Re-Evaluation  06/16/19    Authorization Type  [employer]    Authorization - Visit Number  11   progress report completed on 05/17/2019 (visit 9)   Authorization - Number of Visits  19    OT Start Time  0805    OT Stop Time  0845    OT Time Calculation (min)  40 min    Activity Tolerance  Patient tolerated treatment well    Behavior During Therapy  [facility] for tasks assessed/performed       Past Medical History:  Diagnosis Date  . Arthritis   . BENIGN PROSTATIC HYPERTROPHY, WITH OBSTRUCTION   . Cancer (HCC)    skin, basal, squamous  . COLONIC POLYPS   . Diverticulitis   . DVT (deep venous thrombosis) ([place]) 2000  . Dysrhythmia    Hx Afib- 2017  . Factor 5 Leiden mutation, heterozygous ([place])   . GERD (gastroesophageal reflux disease)    not on medication  . HIATAL HERNIA   . ITP (idiopathic thrombocytopenic purpura) 2003  . Long term current use of anticoagulant   . Parkinson's disease ([place]) 02/03/2018  . PSORIASIS   . Pulmonary embolus ([place]) 2000  . Shortness of breath dyspnea    at times - Talking alot  . Sleep apnea     Past Surgical History:  Procedure Laterality Date  . CARDIOVERSION N/A 11/22/2015   Procedure: CARDIOVERSION;  Surgeon: Sanda Klein, MD;  [facility];  Service: Cardiovascular;  Laterality: N/A;  . COLONOSCOPY    . HERNIA REPAIR Left    Inguinal- x2 . mesh x1  . INSERTION OF MESH N/A 02/25/2016   Procedure: INSERTION OF MESH;  Surgeon: Rolm Bookbinder, MD;  Location: [facility];  Service: General;  Laterality: N/A;   . LUNG REMOVAL, PARTIAL Right 2004   was fungal and not cancerous  . PROSTATE SURGERY    . UMBILICAL HERNIA REPAIR N/A 02/25/2016   Procedure: LAPAROSCOPIC UMBILICAL HERNIA REPAIR;  Surgeon: Rolm Bookbinder, MD;  Location: [facility];  Service: General;  Laterality: N/A;    There were no vitals filed for this visit.  Subjective Assessment - 05/31/19 0812    Subjective   I've got a bad foot    Currently in Pain?  Yes    Pain Score  7     Pain Orientation  Right    Pain Descriptors / Indicators  Aching    Pain Type  Chronic pain    Pain Onset  More than a month ago    Pain Frequency  Intermittent    Aggravating Factors   standing, walking    Pain Relieving Factors  sitting             Treatment: Seated PWR! Flow x 10 reps, min v.c and demonstration.  checked progress towards goals, pt requests d/c today. Grooved pegboard for increased fine motor coordination and in hand manipulation, min difficulty/ v.c Education provided regarding ways to prevent future complications. Pt verbalized understanding.  OT Long Term Goals - 05/31/19 0815      OT LONG TERM GOAL #1   Title  I with PD specific HEP.    Time  5    Period  Weeks    Status  Achieved      OT LONG TERM GOAL #2   Title  Pt will demonstrate increased ease with fatening buttons as evidenced by decreasing 3 button/ un button test  to 45 secs or less     upgraded goal-Pt will consistently perform 3 button/ unbutton in 40 secs or less.    Baseline  53.03    Time  5    Period  Weeks    Status  Achieved   27.38 secs     OT LONG TERM GOAL #3   Title  Pt will verbalzie understanding of adpated strategies for ADLs/IADLs. and ways to prevent future PD related complications.    Time  5    Period  Weeks    Status  Achieved   needs reinforcement     OT LONG TERM GOAL #4   Title  Pt will demonstrate ability to write a sentence with 90% legibility and no significant decrease in letter size.     Baseline  50% legible sentence level    Time  5    Period  Weeks    Status  Achieved   100% legible with no significant decrease in letter size     OT LONG TERM GOAL #5   Title  Pt will report increased ease with fastening his seatbelt.    Time  5    Period  Weeks    Status  Achieved      OT LONG TERM GOAL #6   Title  Pt will increase bilateral shoulder flexion by 5* for increased ease with IADLs.    Baseline  RUE 125, LUE 120    Time  5    Period  Weeks    Status  Achieved   130 bilaterally           Plan - 05/31/19 0813    Clinical Impression Statement  Pt requests d/c today due to foot pain limiting ability to fully progress.    OT Occupational Profile and History  Problem Focused Assessment - Including review of records relating to presenting problem    Occupational performance deficits (Please refer to evaluation for details):  ADL's;IADL's;Social Participation    Body Structure / Function / Physical Skills  ADL;UE functional use;Balance;FMC;Gait;ROM;GMC;Coordination;Decreased knowledge of precautions;Decreased knowledge of use of DME;IADL;Strength;Mobility;Dexterity    Rehab Potential  Good    Clinical Decision Making  Limited treatment options, no task modification necessary    Comorbidities Affecting Occupational Performance:  May have comorbidities impacting occupational performance    Modification or Assistance to Complete Evaluation   No modification of tasks or assist necessary to complete eval    OT Frequency  2x / week    OT Duration  --   5 weeks plus eval   OT Treatment/Interventions  Self-care/ADL training;Ultrasound;Energy conservation;Aquatic Therapy;DME and/or AE instruction;Patient/family education;Balance training;Passive range of motion;Paraffin;Gait Training;Fluidtherapy;Cryotherapy;Therapist, nutritional;Therapeutic exercise;Moist Heat;Manual Therapy;Therapeutic activities;Cognitive remediation/compensation;Neuromuscular education    Plan  d/c  OT    Consulted and Agree with Plan of Care  Patient       Patient will benefit from skilled therapeutic intervention in order to improve the following deficits and impairments:   Body Structure / Function / Physical Skills: ADL, UE functional use, Balance, FMC, Gait,  ROM, GMC, Coordination, Decreased knowledge of precautions, Decreased knowledge of use of DME, IADL, Strength, Mobility, Dexterity       Visit Diagnosis: Other symptoms and signs involving the nervous system  Unsteadiness on feet  Other abnormalities of gait and mobility  Other lack of coordination OCCUPATIONAL THERAPY DISCHARGE SUMMARY    Current functional level related to goals / functional outcomes: Pt achieved all goals, he agrees with plans for d/c   Remaining deficits: Decreased coordination, decreased balance, decreased timing, decreased functional mobility, pain   Education / Equipment: Pt was educated regarding HEP, ways to prevent future PD related complications, adapted strategies for ADLs. Pt verbalized understanding of all education Plan: Patient agrees to discharge.  Patient goals were met. Patient is being discharged due to meeting the stated rehab goals.  ?????       Problem List Patient Active Problem List   Diagnosis Date Noted  . Parkinson's disease ([place]) 02/03/2018  . Community acquired pneumonia of left lower lobe of lung 12/23/2017  . Sepsis ([place]) 12/23/2017  . Morbid obesity due to excess calories ([place]) 08/23/2016  . OSA on CPAP 08/23/2016  . Dyspnea on exertion 08/22/2016  . Umbilical hernia 94/44/6190  . Persistent atrial fibrillation ([place])   . Encounter for therapeutic drug monitoring 08/12/2013  . Long term current use of anticoagulant 07/30/2010  . COLONIC POLYPS 06/03/2010  . 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 GERD 06/03/2010  . HIATAL HERNIA 06/03/2010  . BENIGN PROSTATIC HYPERTROPHY, WITH OBSTRUCTION 06/03/2010  . PSORIASIS 06/03/2010    Osias Resnick 05/31/2019,  3:41 PM Theone Murdoch, OTR/L [phone] Phone: [phone] 3:59 PM 05/31/19 [facility] [address] [place], [place], [address] Phone: [phone]   Fax:  [phone]  Name: KENDAN CORNFORTH MRN: [id number] Date of Birth: 1945-07-26

## 2019-06-07 ENCOUNTER — Encounter: Payer: PPO | Admitting: Occupational Therapy

## 2019-06-09 NOTE — Telephone Encounter (Signed)
I'll let you all figure out where you can put him on your schedule

## 2019-06-16 ENCOUNTER — Encounter: Payer: Self-pay | Admitting: Orthopedic Surgery

## 2019-06-16 ENCOUNTER — Ambulatory Visit: Payer: PPO | Admitting: Orthopedic Surgery

## 2019-06-16 ENCOUNTER — Other Ambulatory Visit: Payer: Self-pay

## 2019-06-16 VITALS — Ht 72.0 in | Wt 274.0 lb

## 2019-06-16 DIAGNOSIS — M19071 Primary osteoarthritis, right ankle and foot: Secondary | ICD-10-CM | POA: Diagnosis not present

## 2019-06-20 ENCOUNTER — Other Ambulatory Visit: Payer: Self-pay | Admitting: Neurology

## 2019-06-20 DIAGNOSIS — G44209 Tension-type headache, unspecified, not intractable: Secondary | ICD-10-CM | POA: Diagnosis not present

## 2019-06-20 DIAGNOSIS — M47812 Spondylosis without myelopathy or radiculopathy, cervical region: Secondary | ICD-10-CM | POA: Diagnosis not present

## 2019-06-20 DIAGNOSIS — M9901 Segmental and somatic dysfunction of cervical region: Secondary | ICD-10-CM | POA: Diagnosis not present

## 2019-06-20 DIAGNOSIS — M542 Cervicalgia: Secondary | ICD-10-CM | POA: Diagnosis not present

## 2019-06-27 ENCOUNTER — Encounter: Payer: Self-pay | Admitting: Orthopedic Surgery

## 2019-06-27 ENCOUNTER — Telehealth: Payer: Self-pay | Admitting: Neurology

## 2019-06-27 DIAGNOSIS — M9901 Segmental and somatic dysfunction of cervical region: Secondary | ICD-10-CM | POA: Diagnosis not present

## 2019-06-27 DIAGNOSIS — M47812 Spondylosis without myelopathy or radiculopathy, cervical region: Secondary | ICD-10-CM | POA: Diagnosis not present

## 2019-06-27 DIAGNOSIS — G44209 Tension-type headache, unspecified, not intractable: Secondary | ICD-10-CM | POA: Diagnosis not present

## 2019-06-27 DIAGNOSIS — M542 Cervicalgia: Secondary | ICD-10-CM | POA: Diagnosis not present

## 2019-06-27 NOTE — Telephone Encounter (Signed)
error 

## 2019-06-27 NOTE — Progress Notes (Signed)
Office Visit Note   Patient: Stanley Wright           Date of Birth: Jan 19, 1946           MRN: [id number] Visit Date: 06/16/2019              Requested by: Maude Leriche, PA-C [place],  [place] [address] PCP: Maude Leriche, PA-C  Chief Complaint  Patient presents with  . Right Foot - Pain      HPI: Patient is a 74 year old gentleman who presents with persistent pain over the talonavicular joint with degenerative arthritis.  Patient also has venous stasis insufficiency and peripheral vascular disease.  Patient complains of persistent pain with ambulation.  No treatments to date have provided him any relief.  Assessment & Plan: Visit Diagnoses: No diagnosis found.  Plan: We will try an ankle stabilizing orthosis and follow-up in 4 weeks.  Discussed that with the patient's peripheral vascular disease and venous insufficiency he is at risk of nonhealing of a talonavicular fusion and potential for transtibial amputation.  Follow-Up Instructions: No follow-ups on file.   Ortho Exam  Patient is alert, oriented, no adenopathy, well-dressed, normal affect, normal respiratory effort. Examination patient has venous stasis changes and brawny edema of the right leg.  Radiographs shows degenerative talonavicular collapse and radiograph shows a calcified dorsalis pedis artery as well as calcification of the digital arteries.  A Doppler was used and he has a dampened monophasic dorsalis pedis pulse with A. fib he is on Coumadin.  He denies hypertension denies high cholesterol denies diabetes.  Patient states he does have Parkinson's and wants to be able to walk better.  He feels like he does have a stiff soled shoe.  Imaging: No results found. No images are attached to the encounter.  Labs: Lab Results  Component Value Date   HGBA1C 6.3 11/21/2015   HGBA1C 6.2 06/12/2015   REPTSTATUS 12/24/2017 FINAL 12/23/2017   CULT  12/23/2017    NO GROWTH Performed at  [facility], [place] [address] [place], [place] [address]    LABORGA ESCHERICHIA COLI 12/22/2017     Lab Results  Component Value Date   ALBUMIN 4.1 01/17/2019   ALBUMIN 2.9 (L) 12/26/2017   ALBUMIN 3.0 (L) 12/25/2017    Lab Results  Component Value Date   MG 1.9 12/25/2017   No results found for: VD25OH  No results found for: PREALBUMIN CBC EXTENDED Latest Ref Rng & Units 12/26/2017 12/25/2017 12/23/2017  WBC 4.0 - 10.5 K/uL 4.7 4.8 7.3  RBC 4.22 - 5.81 MIL/uL 4.22 4.47 4.04(L)  HGB 13.0 - 17.0 g/dL 13.6 14.3 13.1  HCT 39.0 - 52.0 % 40.8 43.3 40.4  PLT 150 - 400 K/uL 118(L) 107(L) 94(L)  NEUTROABS 1.7 - 7.7 K/uL - - -  LYMPHSABS 0.7 - 4.0 K/uL - - -     Body mass index is 37.16 kg/m.  Orders:  No orders of the defined types were placed in this encounter.  No orders of the defined types were placed in this encounter.    Procedures: No procedures performed  Clinical Data: No additional findings.  ROS:  All other systems negative, except as noted in the HPI. Review of Systems  Objective: Vital Signs: Ht 6' (1.829 m)   Wt 274 lb (124.3 kg)   BMI 37.16 kg/m   Specialty Comments:  No specialty comments available.  PMFS History: Patient Active Problem List   Diagnosis Date  Noted  . Parkinson's disease ([place]) 02/03/2018  . Community acquired pneumonia of left lower lobe of lung 12/23/2017  . Sepsis ([place]) 12/23/2017  . Morbid obesity due to excess calories ([place]) 08/23/2016  . OSA on CPAP 08/23/2016  . Dyspnea on exertion 08/22/2016  . Umbilical hernia [id number]  . Persistent atrial fibrillation ([place])   . Encounter for therapeutic drug monitoring 08/12/2013  . Long term current use of anticoagulant 07/30/2010  . COLONIC POLYPS 06/03/2010  . 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 GERD 06/03/2010  . HIATAL HERNIA 06/03/2010  . BENIGN PROSTATIC HYPERTROPHY, WITH OBSTRUCTION 06/03/2010  . PSORIASIS 06/03/2010   Past Medical History:  Diagnosis Date  .  Arthritis   . BENIGN PROSTATIC HYPERTROPHY, WITH OBSTRUCTION   . Cancer (HCC)    skin, basal, squamous  . COLONIC POLYPS   . Diverticulitis   . DVT (deep venous thrombosis) ([place]) 2000  . Dysrhythmia    Hx Afib- 2017  . Factor 5 Leiden mutation, heterozygous ([place])   . GERD (gastroesophageal reflux disease)    not on medication  . HIATAL HERNIA   . ITP (idiopathic thrombocytopenic purpura) 2003  . Long term current use of anticoagulant   . Parkinson's disease ([place]) 02/03/2018  . PSORIASIS   . Pulmonary embolus ([place]) 2000  . Shortness of breath dyspnea    at times - Talking alot  . Sleep apnea     Family History  Problem Relation Age of Onset  . Cancer Father   . Breast cancer Sister   . Heart disease Brother   . Cancer Maternal Grandmother   . Stroke Paternal Grandfather     Past Surgical History:  Procedure Laterality Date  . CARDIOVERSION N/A 11/22/2015   Procedure: CARDIOVERSION;  Surgeon: Sanda Klein, MD;  [facility];  Service: Cardiovascular;  Laterality: N/A;  . COLONOSCOPY    . HERNIA REPAIR Left    Inguinal- x2 . mesh x1  . INSERTION OF MESH N/A 02/25/2016   Procedure: INSERTION OF MESH;  Surgeon: Rolm Bookbinder, MD;  Location: [facility];  Service: General;  Laterality: N/A;  . LUNG REMOVAL, PARTIAL Right 2004   was fungal and not cancerous  . PROSTATE SURGERY    . UMBILICAL HERNIA REPAIR N/A 02/25/2016   Procedure: LAPAROSCOPIC UMBILICAL HERNIA REPAIR;  Surgeon: Rolm Bookbinder, MD;  Location: [facility];  Service: General;  Laterality: N/A;   Social History   Occupational History    Employer: RETIRED    Comment: Engineer, structural  Tobacco Use  . Smoking status: Former Smoker    Packs/day: 1.00    Quit date: 06/16/1985    Years since quitting: 34.0  . Smokeless tobacco: Never Used  . Tobacco comment: quit approx age 46-   Substance and Sexual Activity  . Alcohol use: No  . Drug use: No  . Sexual activity: Yes

## 2019-07-01 ENCOUNTER — Ambulatory Visit (INDEPENDENT_AMBULATORY_CARE_PROVIDER_SITE_OTHER): Payer: PPO | Admitting: *Deleted

## 2019-07-01 ENCOUNTER — Other Ambulatory Visit: Payer: Self-pay

## 2019-07-01 DIAGNOSIS — I4819 Other persistent atrial fibrillation: Secondary | ICD-10-CM

## 2019-07-01 DIAGNOSIS — Z5181 Encounter for therapeutic drug level monitoring: Secondary | ICD-10-CM | POA: Diagnosis not present

## 2019-07-01 LAB — POCT INR: INR: 1.5 — AB (ref 2.0–3.0)

## 2019-07-01 NOTE — Patient Instructions (Signed)
Description   Today take 1.5 tablets then continue taking 1 tablet every day. Recheck INR in 4 weeks. Call with any questions if gets any new medications or scheduled for any procedures  [phone]

## 2019-07-06 NOTE — Progress Notes (Signed)
Reviewed chart with Dr. Jillyn Hidden. Pt will need cardiac clearance prior to surgery 07/12/19.

## 2019-07-08 ENCOUNTER — Other Ambulatory Visit (HOSPITAL_COMMUNITY): Admission: RE | Admit: 2019-07-08 | Payer: PPO | Source: Ambulatory Visit

## 2019-07-12 ENCOUNTER — Ambulatory Visit (HOSPITAL_BASED_OUTPATIENT_CLINIC_OR_DEPARTMENT_OTHER): Admit: 2019-07-12 | Payer: PPO | Admitting: Orthopedic Surgery

## 2019-07-12 ENCOUNTER — Encounter (HOSPITAL_BASED_OUTPATIENT_CLINIC_OR_DEPARTMENT_OTHER): Payer: Self-pay

## 2019-07-12 SURGERY — FUSION, JOINT, FOOT
Anesthesia: Choice | Site: Foot | Laterality: Right

## 2019-07-14 ENCOUNTER — Ambulatory Visit (INDEPENDENT_AMBULATORY_CARE_PROVIDER_SITE_OTHER): Payer: PPO | Admitting: Orthopedic Surgery

## 2019-07-14 ENCOUNTER — Ambulatory Visit (INDEPENDENT_AMBULATORY_CARE_PROVIDER_SITE_OTHER): Payer: PPO

## 2019-07-14 ENCOUNTER — Other Ambulatory Visit: Payer: Self-pay

## 2019-07-14 ENCOUNTER — Encounter: Payer: Self-pay | Admitting: Orthopedic Surgery

## 2019-07-14 VITALS — Ht 72.0 in | Wt 274.0 lb

## 2019-07-14 DIAGNOSIS — Z5181 Encounter for therapeutic drug level monitoring: Secondary | ICD-10-CM | POA: Diagnosis not present

## 2019-07-14 DIAGNOSIS — L97919 Non-pressure chronic ulcer of unspecified part of right lower leg with unspecified severity: Secondary | ICD-10-CM | POA: Diagnosis not present

## 2019-07-14 DIAGNOSIS — I4819 Other persistent atrial fibrillation: Secondary | ICD-10-CM

## 2019-07-14 DIAGNOSIS — R609 Edema, unspecified: Secondary | ICD-10-CM | POA: Diagnosis not present

## 2019-07-14 DIAGNOSIS — M19071 Primary osteoarthritis, right ankle and foot: Secondary | ICD-10-CM | POA: Diagnosis not present

## 2019-07-14 DIAGNOSIS — I83019 Varicose veins of right lower extremity with ulcer of unspecified site: Secondary | ICD-10-CM | POA: Diagnosis not present

## 2019-07-14 DIAGNOSIS — M6701 Short Achilles tendon (acquired), right ankle: Secondary | ICD-10-CM | POA: Diagnosis not present

## 2019-07-14 DIAGNOSIS — I83891 Varicose veins of right lower extremities with other complications: Secondary | ICD-10-CM

## 2019-07-14 LAB — POCT INR: INR: 1.2 — AB (ref 2.0–3.0)

## 2019-07-14 NOTE — Patient Instructions (Signed)
Description   Take 1.5 tablets today and tomorrow, then start taking 1 tablet daily except 1.5 tablets on Mondays. Recheck INR in 1 week. Call with any questions if gets any new medications or scheduled for any procedures  [phone]

## 2019-07-14 NOTE — Progress Notes (Signed)
Office Visit Note   Patient: Stanley Wright           Date of Birth: 09-Oct-1945           MRN: [id number] Visit Date: 07/14/2019              Requested by: Maude Leriche, PA-C [place],  [place] [address] PCP: Maude Leriche, PA-C  Chief Complaint  Patient presents with  . Right Foot - Follow-up, Pain      HPI: Patient is a 74 year old gentleman who presents in follow-up for traumatic arthritis talonavicular joint right foot.  Patient states that despite immobilization pressure unloading and conservative treatment he still has persistent pain with activities of daily living.  He currently ambulates with a rolling walker.  Patient complains of increasing swelling in his foot and ankle.  Assessment & Plan: Visit Diagnoses:  1. Primary osteoarthritis, right ankle and foot     Plan: Due to patient's persistent symptoms and failure of conservative care patient states he would like to proceed with surgical intervention.  Due to his multiple medical problems would plan on surgery at [facility] as an outpatient with fusion of the talonavicular joint.  Risks and benefits were discussed including risk of the wound not healing risk of the bone not healing need for additional surgery.  Patient states he understands wished to proceed at this time.  Follow-Up Instructions: Return in about 2 weeks (around 07/28/2019).   Ortho Exam  Patient is alert, oriented, no adenopathy, well-dressed, normal affect, normal respiratory effort. Examination patient has a palpable dorsalis pedis pulse the Doppler was used and he has a strong diphasic dorsalis pedis pulse.  Patient has pitting edema in the right lower extremity with brawny skin color changes with chronic venous insufficiency without ulceration.  He is point tender to palpation over the talonavicular joint he does have Achilles tightness.  No rocker-bottom deformity.  Imaging: No results found. No images are attached to the  encounter.  Labs: Lab Results  Component Value Date   HGBA1C 6.3 11/21/2015   HGBA1C 6.2 06/12/2015   REPTSTATUS 12/24/2017 FINAL 12/23/2017   CULT  12/23/2017    NO GROWTH Performed at [facility], [place] [name] [place], [place] [address]    LABORGA ESCHERICHIA COLI 12/22/2017     Lab Results  Component Value Date   ALBUMIN 4.1 01/17/2019   ALBUMIN 2.9 (L) 12/26/2017   ALBUMIN 3.0 (L) 12/25/2017    Lab Results  Component Value Date   MG 1.9 12/25/2017   No results found for: VD25OH  No results found for: PREALBUMIN CBC EXTENDED Latest Ref Rng & Units 12/26/2017 12/25/2017 12/23/2017  WBC 4.0 - 10.5 K/uL 4.7 4.8 7.3  RBC 4.22 - 5.81 MIL/uL 4.22 4.47 4.04(L)  HGB 13.0 - 17.0 g/dL 13.6 14.3 13.1  HCT 39.0 - 52.0 % 40.8 43.3 40.4  PLT 150 - 400 K/uL 118(L) 107(L) 94(L)  NEUTROABS 1.7 - 7.7 K/uL - - -  LYMPHSABS 0.7 - 4.0 K/uL - - -     Body mass index is 37.16 kg/m.  Orders:  No orders of the defined types were placed in this encounter.  No orders of the defined types were placed in this encounter.    Procedures: No procedures performed  Clinical Data: No additional findings.  ROS:  All other systems negative, except as noted in the HPI. Review of Systems  Objective: Vital Signs: Ht 6' (1.829 m)  Wt 274 lb (124.3 kg)   BMI 37.16 kg/m   Specialty Comments:  No specialty comments available.  PMFS History: Patient Active Problem List   Diagnosis Date Noted  . Parkinson's disease ([place]) 02/03/2018  . Community acquired pneumonia of left lower lobe of lung 12/23/2017  . Sepsis ([place]) 12/23/2017  . Morbid obesity due to excess calories ([place]) 08/23/2016  . OSA on CPAP 08/23/2016  . Dyspnea on exertion 08/22/2016  . Umbilical hernia [id number]  . Persistent atrial fibrillation ([place])   . Encounter for therapeutic drug monitoring 08/12/2013  . Long term current use of anticoagulant 07/30/2010  . COLONIC POLYPS 06/03/2010  . FACTOR V  DEFICIENCY 06/03/2010  . GERD 06/03/2010  . HIATAL HERNIA 06/03/2010  . BENIGN PROSTATIC HYPERTROPHY, WITH OBSTRUCTION 06/03/2010  . PSORIASIS 06/03/2010   Past Medical History:  Diagnosis Date  . Arthritis   . BENIGN PROSTATIC HYPERTROPHY, WITH OBSTRUCTION   . Cancer (HCC)    skin, basal, squamous  . COLONIC POLYPS   . Diverticulitis   . DVT (deep venous thrombosis) ([place]) 2000  . Dysrhythmia    Hx Afib- 2017  . Factor 5 Leiden mutation, heterozygous ([place])   . GERD (gastroesophageal reflux disease)    not on medication  . HIATAL HERNIA   . ITP (idiopathic thrombocytopenic purpura) 2003  . Long term current use of anticoagulant   . Parkinson's disease ([place]) 02/03/2018  . PSORIASIS   . Pulmonary embolus ([place]) 2000  . Shortness of breath dyspnea    at times - Talking alot  . Sleep apnea     Family History  Problem Relation Age of Onset  . Cancer Father   . Breast cancer Sister   . Heart disease Brother   . Cancer Maternal Grandmother   . Stroke Paternal Grandfather     Past Surgical History:  Procedure Laterality Date  . CARDIOVERSION N/A 11/22/2015   Procedure: CARDIOVERSION;  Surgeon: Sanda Klein, MD;  [facility];  Service: Cardiovascular;  Laterality: N/A;  . COLONOSCOPY    . HERNIA REPAIR Left    Inguinal- x2 . mesh x1  . INSERTION OF MESH N/A 02/25/2016   Procedure: INSERTION OF MESH;  Surgeon: Rolm Bookbinder, MD;  Location: [facility];  Service: General;  Laterality: N/A;  . LUNG REMOVAL, PARTIAL Right 2004   was fungal and not cancerous  . PROSTATE SURGERY    . UMBILICAL HERNIA REPAIR N/A 02/25/2016   Procedure: LAPAROSCOPIC UMBILICAL HERNIA REPAIR;  Surgeon: Rolm Bookbinder, MD;  Location: [facility];  Service: General;  Laterality: N/A;   Social History   Occupational History    Employer: RETIRED    Comment: Engineer, structural  Tobacco Use  . Smoking status: Former Smoker    Packs/day: 1.00    Quit date: 06/16/1985    Years since quitting: 34.0  .  Smokeless tobacco: Never Used  . Tobacco comment: quit approx age 33-   Substance and Sexual Activity  . Alcohol use: No  . Drug use: No  . Sexual activity: Yes

## 2019-07-19 ENCOUNTER — Encounter: Payer: Self-pay | Admitting: Cardiology

## 2019-07-19 ENCOUNTER — Telehealth: Payer: Self-pay

## 2019-07-19 ENCOUNTER — Telehealth (INDEPENDENT_AMBULATORY_CARE_PROVIDER_SITE_OTHER): Payer: PPO | Admitting: Cardiology

## 2019-07-19 ENCOUNTER — Telehealth: Payer: Self-pay | Admitting: Pharmacist

## 2019-07-19 VITALS — BP 159/110 | HR 92 | Ht 72.0 in | Wt 260.0 lb

## 2019-07-19 DIAGNOSIS — Z86718 Personal history of other venous thrombosis and embolism: Secondary | ICD-10-CM

## 2019-07-19 DIAGNOSIS — Z86711 Personal history of pulmonary embolism: Secondary | ICD-10-CM | POA: Insufficient documentation

## 2019-07-19 DIAGNOSIS — I82409 Acute embolism and thrombosis of unspecified deep veins of unspecified lower extremity: Secondary | ICD-10-CM | POA: Insufficient documentation

## 2019-07-19 DIAGNOSIS — M7989 Other specified soft tissue disorders: Secondary | ICD-10-CM

## 2019-07-19 DIAGNOSIS — D6851 Activated protein C resistance: Secondary | ICD-10-CM

## 2019-07-19 DIAGNOSIS — M79604 Pain in right leg: Secondary | ICD-10-CM | POA: Diagnosis not present

## 2019-07-19 DIAGNOSIS — G20 Parkinson's disease: Secondary | ICD-10-CM

## 2019-07-19 DIAGNOSIS — Z7901 Long term (current) use of anticoagulants: Secondary | ICD-10-CM

## 2019-07-19 DIAGNOSIS — R600 Localized edema: Secondary | ICD-10-CM

## 2019-07-19 NOTE — Telephone Encounter (Signed)

## 2019-07-19 NOTE — Patient Instructions (Signed)
Medication Instructions:  Your physician recommends that you continue on your current medications as directed. Please refer to the Current Medication list given to you today. *If you need a refill on your cardiac medications before your next appointment, please call your pharmacy*  Lab Work: None  If you have labs (blood work) drawn today and your tests are completely normal, you will receive your results only by: Marland Kitchen MyChart Message (if you have MyChart) OR . A paper copy in the mail If you have any lab test that is abnormal or we need to change your treatment, we will call you to review the results.  Testing/Procedures: Your physician has requested that you have a lower or upper extremity venous duplex. This test is an ultrasound of the veins in the legs. It looks at venous blood flow that carries blood from the heart to the legs. Allow one hour for a Lower Venous exam. There are no restrictions or special instructions. PLEASE SCHEDULE ASAP    Follow-Up: At [facility], you and your health needs are our priority.  As part of our continuing mission to provide you with exceptional heart care, we have created designated Provider Care Teams.  These Care Teams include your primary Cardiologist (physician) and Advanced Practice Providers (APPs -  Physician Assistants and Nurse Practitioners) who all work together to provide you with the care you need, when you need it.  Your next appointment:    to be determined after ultrasound  The format for your next appointment:   Either In Person or Virtual  Provider:   Kerin Ransom, PA-C  Other Instructions

## 2019-07-19 NOTE — Telephone Encounter (Signed)
Spoke with pt and reviewed encounter routed to [facility] triage. Pt confirmed info. Pt states he was referring to RLE as the "fat leg". Informed pt that virtual appt available today at 11:15 am with Kerin Ransom, PA-C to discuss symptoms and recommendations. Pt agreeable to be scheduled for this appt.

## 2019-07-19 NOTE — Telephone Encounter (Signed)
Patient called stating he woke up on Saturday with a "fat leg" he did not want to go to hospital and knew his INR has been low so he took an extra 1/2 tablet of warfarin on Saturday and Sunday. States that his leg is still "fat".  I asked if it was warm to the touch or if it painful. He states there is some pain in his calf. I advised that this should be examined by a Dr. And I will route to triage RN for further advise.  I also advised him to continue to take his coumadin as prescribed and we will check his INR on Thursday.

## 2019-07-19 NOTE — Telephone Encounter (Signed)
Contacted patient to discuss AVS Instructions. Gave patient Luke's recommendations from today's virtual office visit. Informed patient that someone from the scheduling dept will be in contact with them to schedule their follow up appt. Patient voiced understanding; AVS printed and mailed to patient.    

## 2019-07-19 NOTE — Progress Notes (Signed)
Virtual Visit via Telephone Note   This visit type was conducted due to national recommendations for restrictions regarding the COVID-19 Pandemic (e.g. social distancing) in an effort to limit this patient's exposure and mitigate transmission in our community.  Due to his co-morbid illnesses, this patient is at least at moderate risk for complications without adequate follow up.  This format is felt to be most appropriate for this patient at this time.  The patient did not have access to video technology/had technical difficulties with video requiring transitioning to audio format only (telephone).  All issues noted in this document were discussed and addressed.  No physical exam could be performed with this format.  Please refer to the patient's chart for his  consent to telehealth for [facility].   Date:  07/19/2019   ID:  Revonda Standard, DOB [birth year], MRN [id number]  Patient Location: Home Provider Location: Home  PCP:  Scifres, Earlie Server, PA-C  Cardiologist:  Kirk Ruths, MD  Electrophysiologist:  None   Evaluation Performed:  Follow-Up Visit  Chief Complaint:  Rt leg edema  History of Present Illness:    Stanley Wright is a 74 y.o. male with a history of a clotting disorder, he has factor V Leiden.  He has had 2 prior pulmonary emboli.  He is on chronic Coumadin therapy.  He also has atrial fibrillation and Parkinson's.  He did have a nuclear scan in February 2018 that showed no ischemia or infarction.  He saw Dr. Stanford Breed last in August 2020.  He has a history of some lower extremity edema noted in the chart.  His INR is followed at [facility].  Recently his INR has become subtherapeutic, the patient told me "maybe I missed a dose or 2".  He now has new right lower extremity edema per his history.  His Coumadin dose has already been adjusted.  He denies any unusual shortness of breath, chest pain, or hemoptysis.  He was added on to my schedule today by the pharmacist after he  had mentioned the edema to them.  The patient does not have symptoms concerning for COVID-19 infection (fever, chills, cough, or new shortness of breath).    Past Medical History:  Diagnosis Date  . Arthritis   . BENIGN PROSTATIC HYPERTROPHY, WITH OBSTRUCTION   . Cancer (HCC)    skin, basal, squamous  . COLONIC POLYPS   . Diverticulitis   . DVT (deep venous thrombosis) ([place]) 2000  . Dysrhythmia    Hx Afib- 2017  . Factor 5 Leiden mutation, heterozygous ([place])   . GERD (gastroesophageal reflux disease)    not on medication  . HIATAL HERNIA   . ITP (idiopathic thrombocytopenic purpura) 2003  . Long term current use of anticoagulant   . Parkinson's disease ([place]) 02/03/2018  . PSORIASIS   . Pulmonary embolus ([place]) 2000  . Shortness of breath dyspnea    at times - Talking alot  . Sleep apnea    Past Surgical History:  Procedure Laterality Date  . CARDIOVERSION N/A 11/22/2015   Procedure: CARDIOVERSION;  Surgeon: Sanda Klein, MD;  Location: [facility];  Service: Cardiovascular;  Laterality: N/A;  . COLONOSCOPY    . HERNIA REPAIR Left    Inguinal- x2 . mesh x1  . INSERTION OF MESH N/A 02/25/2016   Procedure: INSERTION OF MESH;  Surgeon: Rolm Bookbinder, MD;  Location: [place];  Service: General;  Laterality: N/A;  . LUNG REMOVAL, PARTIAL Right 2004   was fungal and not  cancerous  . PROSTATE SURGERY    . UMBILICAL HERNIA REPAIR N/A 02/25/2016   Procedure: LAPAROSCOPIC UMBILICAL HERNIA REPAIR;  Surgeon: Rolm Bookbinder, MD;  Location: [place];  Service: General;  Laterality: N/A;     Current Meds  Medication Sig  . carbidopa-levodopa (SINEMET CR) 50-200 MG tablet TAKE 1 TABLET BY MOUTH AT BEDTIME  . carbidopa-levodopa (SINEMET IR) 25-100 MG tablet 1 tablet 4-5 times per day  . CARTIA XT 300 MG 24 hr capsule Take 1 capsule by mouth once daily  . furosemide (LASIX) 20 MG tablet Take 2 tablets (40 mg total) by mouth daily.  Marland Kitchen warfarin (COUMADIN) 5 MG tablet TAKE 1 TABLET BY  MOUTH ONCE DAILY OR  AS  DIRECTED  BY  COUMADIN  CLINIC     Allergies:   Tylenol [acetaminophen]   Social History   Tobacco Use  . Smoking status: Former Smoker    Packs/day: 1.00    Quit date: 06/16/1985    Years since quitting: 34.1  . Smokeless tobacco: Never Used  . Tobacco comment: quit approx age 12-   Substance Use Topics  . Alcohol use: No  . Drug use: No     Family Hx: The patient's family history includes Breast cancer in his sister; Cancer in his father and maternal grandmother; Heart disease in his brother; Stroke in his paternal grandfather.  ROS:   Please see the history of present illness.    All other systems reviewed and are negative.   Prior CV studies:   The following studies were reviewed today: Echo sept 2019  Labs/Other Tests and Data Reviewed:    EKG:  An ECG dated 01/19/2019 was personally reviewed today and demonstrated:  AF with VR 90, inferior Qs, LAD  Recent Labs: 01/17/2019: ALT 6 01/28/2019: BUN 13; Creatinine, Ser 0.98; Potassium 4.6; Sodium 140   Recent Lipid Panel Lab Results  Component Value Date/Time   CHOL 205 (H) 06/12/2015 03:05 PM   TRIG 274 (H) 06/12/2015 03:05 PM   HDL 46 06/12/2015 03:05 PM   CHOLHDL 4.5 06/12/2015 03:05 PM   LDLCALC 104 06/12/2015 03:05 PM    Wt Readings from Last 3 Encounters:  07/19/19 260 lb (117.9 kg)  07/14/19 274 lb (124.3 kg)  06/16/19 274 lb (124.3 kg)     Objective:    Vital Signs:  BP (!) 159/110 (BP Location: Left Arm, Patient Position: Sitting, Cuff Size: Normal)   Pulse 92   Ht 6' (1.829 m)   Wt 260 lb (117.9 kg)   BMI 35.26 kg/m    VITAL SIGNS:  reviewed  ASSESSMENT & PLAN:    Rt lower extremity edema- In setting of subtherapeutic INR- r/o DVT  Factor V Leiden- On chronic Coumadin rx  CAF- Rate control  Parkinson's- Followed by Neurology  Plan: Rt LE venous doppler ASAP.  His Coumadin has already been adjusted by pharmacy.  If he has a DVT he may need Lovenox till  theraputic with overlap. If not consider a diuretic or an adjustment in his Diltiazem dose.   COVID-19 Education: The signs and symptoms of COVID-19 were discussed with the patient and how to seek care for testing (follow up with PCP or arrange E-visit).  The importance of social distancing was discussed today.  Time:   Today, I have spent 15 minutes with the patient with telehealth technology discussing the above problems.     Medication Adjustments/Labs and Tests Ordered: Current medicines are reviewed at length with the patient today.  Concerns regarding medicines are outlined above.   Tests Ordered: Orders Placed This Encounter  Procedures  . VAS [place] LOWER EXTREMITY VENOUS (DVT)    Medication Changes: No orders of the defined types were placed in this encounter.   Follow Up:  Either In Person or Virtual depending on doppler results  Signed, Kerin Ransom, PA-C  07/19/2019 12:01 PM    [place]

## 2019-07-20 ENCOUNTER — Ambulatory Visit (HOSPITAL_COMMUNITY)
Admission: RE | Admit: 2019-07-20 | Discharge: 2019-07-20 | Disposition: A | Discharge status: Home / Self Care | Payer: PPO | Source: Ambulatory Visit | Attending: Cardiology | Admitting: Cardiology

## 2019-07-20 ENCOUNTER — Ambulatory Visit (INDEPENDENT_AMBULATORY_CARE_PROVIDER_SITE_OTHER): Payer: PPO | Admitting: Pharmacist Clinician (PhC)/ Clinical Pharmacy Specialist

## 2019-07-20 ENCOUNTER — Ambulatory Visit (INDEPENDENT_AMBULATORY_CARE_PROVIDER_SITE_OTHER): Payer: PPO | Admitting: Cardiology

## 2019-07-20 ENCOUNTER — Encounter (HOSPITAL_COMMUNITY): Payer: Self-pay

## 2019-07-20 ENCOUNTER — Encounter: Payer: Self-pay | Admitting: Cardiology

## 2019-07-20 ENCOUNTER — Other Ambulatory Visit: Payer: Self-pay

## 2019-07-20 VITALS — BP 110/70 | HR 80 | Ht 72.0 in | Wt 260.0 lb

## 2019-07-20 DIAGNOSIS — M79604 Pain in right leg: Secondary | ICD-10-CM | POA: Diagnosis not present

## 2019-07-20 DIAGNOSIS — Z86718 Personal history of other venous thrombosis and embolism: Secondary | ICD-10-CM | POA: Insufficient documentation

## 2019-07-20 DIAGNOSIS — D6851 Activated protein C resistance: Secondary | ICD-10-CM

## 2019-07-20 DIAGNOSIS — I4819 Other persistent atrial fibrillation: Secondary | ICD-10-CM

## 2019-07-20 DIAGNOSIS — M7989 Other specified soft tissue disorders: Secondary | ICD-10-CM | POA: Insufficient documentation

## 2019-07-20 DIAGNOSIS — I82401 Acute embolism and thrombosis of unspecified deep veins of right lower extremity: Secondary | ICD-10-CM | POA: Diagnosis not present

## 2019-07-20 DIAGNOSIS — Z5181 Encounter for therapeutic drug level monitoring: Secondary | ICD-10-CM | POA: Diagnosis not present

## 2019-07-20 LAB — POCT INR: INR: 3.4 — AB (ref 2.0–3.0)

## 2019-07-20 NOTE — Progress Notes (Signed)
HPI: FUpermanentatrial fibrillation and history of factor V Leiden with 2 previous pulmonary emboli. He is on chronic Coumadin. Abdominal CT 2011 showed no aneurysm. Found to be in atrial fibrillation onpreviouselectrocardiogram. TSH 2.88. Patient placed on Cardizem for rate control. Had successful DCCV 11/22/15.However atrial fibrillation recurred. Holter monitor September 2017 showed atrial fibrillation with PVCs or aberrantly conducted beats. Cardizem increased to 300 mg daily.Nuclear study 2/18 showed no no ischemia or infarction.  Previously treated with hydrochlorothiazide for volume excess. Patient seen by Kerin Ransom yesterday with complaints of new right lower extremity edema.  He apparently had missed some doses of Coumadin.  Lower extremity Dopplers today showed DVT and he was added to my schedule.  Since last seen,patient has mild dyspnea unchanged.  He has had no sudden onset of dyspnea.  He denies chest pain or syncope.  Current Outpatient Medications  Medication Sig Dispense Refill  . carbidopa-levodopa (SINEMET CR) 50-200 MG tablet TAKE 1 TABLET BY MOUTH AT BEDTIME 90 tablet 0  . carbidopa-levodopa (SINEMET IR) 25-100 MG tablet 1 tablet 4-5 times per day 450 tablet 1  . CARTIA XT 300 MG 24 hr capsule Take 1 capsule by mouth once daily 90 capsule 0  . furosemide (LASIX) 20 MG tablet Take 2 tablets (40 mg total) by mouth daily. 180 tablet 3  . warfarin (COUMADIN) 5 MG tablet TAKE 1 TABLET BY MOUTH ONCE DAILY OR  AS  DIRECTED  BY  COUMADIN  CLINIC 90 tablet 0   No current facility-administered medications for this visit.     Past Medical History:  Diagnosis Date  . Arthritis   . BENIGN PROSTATIC HYPERTROPHY, WITH OBSTRUCTION   . Cancer (HCC)    skin, basal, squamous  . COLONIC POLYPS   . Diverticulitis   . DVT (deep venous thrombosis) ([place]) 2000  . Dysrhythmia    Hx Afib- 2017  . Factor 5 Leiden mutation, heterozygous ([place])   . GERD (gastroesophageal reflux  disease)    not on medication  . HIATAL HERNIA   . ITP (idiopathic thrombocytopenic purpura) 2003  . Long term current use of anticoagulant   . Parkinson's disease ([place]) 02/03/2018  . PSORIASIS   . Pulmonary embolus ([place]) 2000  . Shortness of breath dyspnea    at times - Talking alot  . Sleep apnea     Past Surgical History:  Procedure Laterality Date  . CARDIOVERSION N/A 11/22/2015   Procedure: CARDIOVERSION;  Surgeon: Sanda Klein, MD;  [facility];  Service: Cardiovascular;  Laterality: N/A;  . COLONOSCOPY    . HERNIA REPAIR Left    Inguinal- x2 . mesh x1  . INSERTION OF MESH N/A 02/25/2016   Procedure: INSERTION OF MESH;  Surgeon: Rolm Bookbinder, MD;  Location: [place];  Service: General;  Laterality: N/A;  . LUNG REMOVAL, PARTIAL Right 2004   was fungal and not cancerous  . PROSTATE SURGERY    . UMBILICAL HERNIA REPAIR N/A 02/25/2016   Procedure: LAPAROSCOPIC UMBILICAL HERNIA REPAIR;  Surgeon: Rolm Bookbinder, MD;  Location: [place];  Service: General;  Laterality: N/A;    Social History   Socioeconomic History  . Marital status: Divorced    Spouse name: Not on file  . Number of children: 2  . Years of education: Masters  . Highest education level: Not on file  Occupational History    Employer: RETIRED    Comment: police officer  Tobacco Use  . Smoking status: Former Smoker  Packs/day: 1.00    Quit date: 06/16/1985    Years since quitting: 34.1  . Smokeless tobacco: Never Used  . Tobacco comment: quit approx age 28-   Substance and Sexual Activity  . Alcohol use: No  . Drug use: No  . Sexual activity: Yes  Other Topics Concern  . Not on file  Social History Narrative   Lives alone   Caffeine use: Coffee daily   Right handed    Social Determinants of Health   Financial Resource Strain:   . Difficulty of Paying Living Expenses: Not on file  Food Insecurity:   . Worried About Charity fundraiser in the Last Year: Not on file  . Ran Out of  Food in the Last Year: Not on file  Transportation Needs:   . Lack of Transportation (Medical): Not on file  . Lack of Transportation (Non-Medical): Not on file  Physical Activity:   . Days of Exercise per Week: Not on file  . Minutes of Exercise per Session: Not on file  Stress:   . Feeling of Stress : Not on file  Social Connections:   . Frequency of Communication with Friends and Family: Not on file  . Frequency of Social Gatherings with Friends and Family: Not on file  . Attends Religious Services: Not on file  . Active Member of Clubs or Organizations: Not on file  . Attends Archivist Meetings: Not on file  . Marital Status: Not on file  Intimate Partner Violence:   . Fear of Current or Ex-Partner: Not on file  . Emotionally Abused: Not on file  . Physically Abused: Not on file  . Sexually Abused: Not on file    Family History  Problem Relation Age of Onset  . Cancer Father   . Breast cancer Sister   . Heart disease Brother   . Cancer Maternal Grandmother   . Stroke Paternal Grandfather     ROS: Unsteady gait from Parkinson's but no fevers or chills, productive cough, hemoptysis, dysphasia, odynophagia, melena, hematochezia, dysuria, hematuria, rash, seizure activity, orthopnea, PND, claudication. Remaining systems are negative.  Physical Exam: Well-developed well-nourished in no acute distress.  Skin is warm and dry.  HEENT is normal.  Neck is supple.  Chest is clear to auscultation with normal expansion.  Cardiovascular exam is irregular Abdominal exam nontender or distended. No masses palpated. Extremities show 2+ edema left ankle and 2-3+ from knee down on the right. neuro grossly intact  ECG- personally reviewed  A/P  1 right lower extremity DVT-patient missed some of his doses of Coumadin.  His INR on January 28 was 1.2.  He is now therapeutic at 3.4.  Continue Coumadin.  He has had no sudden onset of dyspnea to suggest pulmonary embolus.  I  asked him to contact us with any symptoms similar to this.  I discussed the importance of compliance with Coumadin.  2 permanent atrial fibrillation-continue Cardizem for rate control.  Continue Coumadin.  3 factor V Leiden-continue Coumadin.  4 chronic pedal edema-continue present dose of Lasix.  Likely related to venous insufficiency.  5 Parkinson's-followed by neurology.  Kirk Ruths, MD

## 2019-07-20 NOTE — Progress Notes (Signed)
Bilateral lower extremity venous duplex completed. Evidence of acute deep venous thrombosis in the right lower extremity. See results under Chart Review- CV Proc. Spoke with Dr. Stanford Breed, and patient was asked to have INR drawn to determine treatment.   Mariane Masters, RVT

## 2019-07-20 NOTE — Patient Instructions (Signed)
Continue with 1 tablet daily except 1.5 tablets on Fridays. Recheck INR in 1 week. Call with any questions if gets any new medications or scheduled for any procedures  [phone]

## 2019-07-20 NOTE — Patient Instructions (Signed)
Medication Instructions:  NO CHANGE *If you need a refill on your cardiac medications before your next appointment, please call your pharmacy*  Lab Work: If you have labs (blood work) drawn today and your tests are completely normal, you will receive your results only by: Marland Kitchen MyChart Message (if you have MyChart) OR . A paper copy in the mail If you have any lab test that is abnormal or we need to change your treatment, we will call you to review the results.  Follow-Up: At [facility], you and your health needs are our priority.  As part of our continuing mission to provide you with exceptional heart care, we have created designated Provider Care Teams.  These Care Teams include your primary Cardiologist (physician) and Advanced Practice Providers (APPs -  Physician Assistants and Nurse Practitioners) who all work together to provide you with the care you need, when you need it.  Your next appointment:   2 week(s)  The format for your next appointment:   In Person  Provider:   Kerin Ransom PA-C

## 2019-07-25 DIAGNOSIS — I5032 Chronic diastolic (congestive) heart failure: Secondary | ICD-10-CM

## 2019-07-25 NOTE — Telephone Encounter (Signed)
Called the pt in regards to his My Chart message.. he reports that his right leg that has the DVT has been "leaking" clear fluid since this past Saturday.. for 2 days.. he says the edema is improving, there is no pain, no change in color... he does not have a fever.. he denies chest pain, sob, dizziness... he generally feels well expect for the fluid coming form his inner calf. He has been taking his Coumadin and lasix with no missed doses.   Will forward to Dr. Stanford Breed for review and recommendations.

## 2019-07-26 ENCOUNTER — Ambulatory Visit (INDEPENDENT_AMBULATORY_CARE_PROVIDER_SITE_OTHER): Payer: PPO | Admitting: *Deleted

## 2019-07-26 ENCOUNTER — Other Ambulatory Visit: Payer: Self-pay

## 2019-07-26 DIAGNOSIS — Z5181 Encounter for therapeutic drug level monitoring: Secondary | ICD-10-CM

## 2019-07-26 DIAGNOSIS — I4819 Other persistent atrial fibrillation: Secondary | ICD-10-CM | POA: Diagnosis not present

## 2019-07-26 DIAGNOSIS — D6851 Activated protein C resistance: Secondary | ICD-10-CM | POA: Diagnosis not present

## 2019-07-26 LAB — POCT INR: INR: 2.1 (ref 2.0–3.0)

## 2019-07-26 NOTE — Patient Instructions (Signed)
Description   Today take 1.5 tablets then continue taking 1 tablet daily except 1.5 tablets on Fridays. Recheck INR in 1 week. Call with any questions if gets any new medications or scheduled for any procedures  [phone]

## 2019-07-29 MED ORDER — FUROSEMIDE 20 MG PO TABS: 40.0000 mg | ORAL_TABLET | Freq: Every day | ORAL | 3 refills | Status: DC

## 2019-08-04 ENCOUNTER — Ambulatory Visit: Payer: PPO | Admitting: Cardiology

## 2019-08-06 ENCOUNTER — Other Ambulatory Visit: Payer: Self-pay | Admitting: Cardiology

## 2019-08-08 ENCOUNTER — Ambulatory Visit: Payer: PPO | Admitting: Cardiology

## 2019-08-08 ENCOUNTER — Other Ambulatory Visit: Payer: Self-pay | Admitting: Physician Assistant

## 2019-08-10 ENCOUNTER — Other Ambulatory Visit: Payer: Self-pay

## 2019-08-10 ENCOUNTER — Ambulatory Visit (INDEPENDENT_AMBULATORY_CARE_PROVIDER_SITE_OTHER): Payer: PPO | Admitting: Pharmacist

## 2019-08-10 ENCOUNTER — Encounter: Payer: Self-pay | Admitting: Cardiology

## 2019-08-10 ENCOUNTER — Ambulatory Visit (INDEPENDENT_AMBULATORY_CARE_PROVIDER_SITE_OTHER): Payer: PPO | Admitting: Cardiology

## 2019-08-10 VITALS — BP 125/75 | HR 59 | Temp 98.4°F | Ht 72.0 in | Wt 260.0 lb

## 2019-08-10 DIAGNOSIS — I5032 Chronic diastolic (congestive) heart failure: Secondary | ICD-10-CM

## 2019-08-10 DIAGNOSIS — G20 Parkinson's disease: Secondary | ICD-10-CM | POA: Diagnosis not present

## 2019-08-10 DIAGNOSIS — I4819 Other persistent atrial fibrillation: Secondary | ICD-10-CM

## 2019-08-10 DIAGNOSIS — D6851 Activated protein C resistance: Secondary | ICD-10-CM

## 2019-08-10 DIAGNOSIS — R600 Localized edema: Secondary | ICD-10-CM | POA: Insufficient documentation

## 2019-08-10 DIAGNOSIS — I82411 Acute embolism and thrombosis of right femoral vein: Secondary | ICD-10-CM

## 2019-08-10 DIAGNOSIS — Z86711 Personal history of pulmonary embolism: Secondary | ICD-10-CM

## 2019-08-10 DIAGNOSIS — Z5181 Encounter for therapeutic drug level monitoring: Secondary | ICD-10-CM

## 2019-08-10 DIAGNOSIS — Z7901 Long term (current) use of anticoagulants: Secondary | ICD-10-CM | POA: Diagnosis not present

## 2019-08-10 LAB — POCT INR: INR: 1.5 — AB (ref 2.0–3.0)

## 2019-08-10 MED ORDER — FUROSEMIDE 40 MG PO TABS: ORAL_TABLET | ORAL | 1 refills | Status: DC

## 2019-08-10 NOTE — Assessment & Plan Note (Signed)
Coumadin Rx- INR 1.5 today ! Pharmacy following

## 2019-08-10 NOTE — Assessment & Plan Note (Signed)
2008 

## 2019-08-10 NOTE — Progress Notes (Addendum)
Cardiology Office Note:    Date:  08/10/2019   ID:  Stanley Wright Wright, DOB [birth year], MRN [id number]  PCP:  Stanley Leriche, PA-C  Cardiologist:  Stanley Ruths, MD  Electrophysiologist:  None   Referring MD: Stanley Wright Wright   CC:  RLE edema  History of Present Illness:    Stanley Wright Wright is a 74 y.o. male with a hx of factor V Leiden.  He has had 2 prior pulmonary emboli.  He is on chronic Coumadin therapy.  He also has atrial fibrillation and Parkinson's.  He did have a nuclear scan in February 2018 that showed no ischemia or infarction.  Echo in Sept 2019 showed an EF of 55-60%, mild LAE, PA pressure 31 mmHg.  He has a history of lower extremity edema noted in the chart.  Recently his INR became subtherapeutic after the patient missed a few doses.  He was contacted virtually 07/19/2019 with complaints of new RLE edema.  LE venous dopplers done the next day showed extensive acute DVT in his RLE.  He was seen in the office by Stanley Wright Wright 07/20/2019.  His INR was now therapeutic and the patient exhibited no signs of PE and new new Rx was recommended.    He is seen today in f/u.  He still has RLE edema though he thinks its a little improved.  He has 1+ chronic LLE edema as well.  He denies any chest pain or dyspnea.  His INR was checked today.     Past Medical History:  Diagnosis Date  . Arthritis   . BENIGN PROSTATIC HYPERTROPHY, WITH OBSTRUCTION   . Cancer (HCC)    skin, basal, squamous  . COLONIC POLYPS   . Diverticulitis   . DVT (deep venous thrombosis) ([place]) 2000  . Dysrhythmia    Hx Afib- 2017  . Factor 5 Leiden mutation, heterozygous ([place])   . GERD (gastroesophageal reflux disease)    not on medication  . HIATAL HERNIA   . ITP (idiopathic thrombocytopenic purpura) 2003  . Long term current use of anticoagulant   . Parkinson's disease ([place]) 02/03/2018  . PSORIASIS   . Pulmonary embolus ([place]) 2000  . Shortness of breath dyspnea    at times - Talking alot  . Sleep  apnea     Past Surgical History:  Procedure Laterality Date  . CARDIOVERSION N/A 11/22/2015   Procedure: CARDIOVERSION;  Surgeon: Sanda Klein, MD;  [facility];  Service: Cardiovascular;  Laterality: N/A;  . COLONOSCOPY    . HERNIA REPAIR Left    Inguinal- x2 . mesh x1  . INSERTION OF MESH N/A 02/25/2016   Procedure: INSERTION OF MESH;  Surgeon: Rolm Bookbinder, MD;  Location: [place];  Service: General;  Laterality: N/A;  . LUNG REMOVAL, PARTIAL Right 2004   was fungal and not cancerous  . PROSTATE SURGERY    . UMBILICAL HERNIA REPAIR N/A 02/25/2016   Procedure: LAPAROSCOPIC UMBILICAL HERNIA REPAIR;  Surgeon: Rolm Bookbinder, MD;  Location: [place];  Service: General;  Laterality: N/A;    Current Medications: Current Meds  Medication Sig  . carbidopa-levodopa (SINEMET CR) 50-200 MG tablet TAKE 1 TABLET BY MOUTH AT BEDTIME  . carbidopa-levodopa (SINEMET IR) 25-100 MG tablet 1 tablet 4-5 times per day  . CARTIA XT 300 MG 24 hr capsule Take 1 capsule by mouth once daily  . furosemide (LASIX) 40 MG tablet Take 2 tablets (80 mg) on Monday, Wednesday, Friday; 1 tablet (40 mg) all other days.  Marland Kitchen  warfarin (COUMADIN) 5 MG tablet TAKE 1 TABLET BY MOUTH ONCE DAILY OR  AS  DIRECTED  BY  COUMADIN  CLINIC  . [DISCONTINUED] furosemide (LASIX) 20 MG tablet Take 2 tablets (40 mg total) by mouth daily.     Allergies:   Tylenol [acetaminophen]   Social History   Socioeconomic History  . Marital status: Divorced    Spouse name: Not on file  . Number of children: 2  . Years of education: Masters  . Highest education level: Not on file  Occupational History    Employer: RETIRED    Comment: police officer  Tobacco Use  . Smoking status: Former Smoker    Packs/day: 1.00    Quit date: 06/16/1985    Years since quitting: 34.1  . Smokeless tobacco: Never Used  . Tobacco comment: quit approx age 20-   Substance and Sexual Activity  . Alcohol use: No  . Drug use: No  . Sexual  activity: Yes  Other Topics Concern  . Not on file  Social History Narrative   Lives alone   Caffeine use: Coffee daily   Right handed    Social Determinants of Health   Financial Resource Strain:   . Difficulty of Paying Living Expenses: Not on file  Food Insecurity:   . Worried About Charity fundraiser in the Last Year: Not on file  . Ran Out of Food in the Last Year: Not on file  Transportation Needs:   . Lack of Transportation (Medical): Not on file  . Lack of Transportation (Non-Medical): Not on file  Physical Activity:   . Days of Exercise per Week: Not on file  . Minutes of Exercise per Session: Not on file  Stress:   . Feeling of Stress : Not on file  Social Connections:   . Frequency of Communication with Friends and Family: Not on file  . Frequency of Social Gatherings with Friends and Family: Not on file  . Attends Religious Services: Not on file  . Active Member of Clubs or Organizations: Not on file  . Attends Archivist Meetings: Not on file  . Marital Status: Not on file     Family History: The patient's family history includes Breast cancer in his sister; Cancer in his father and maternal grandmother; Heart disease in his brother; Stroke in his paternal grandfather.  ROS:   Please see the history of present illness.  The patient told me he may need Rt foot surgery (Stanley Stanley Wright Wright)- possible in March.    All other systems reviewed and are negative.  EKGs/Labs/Other Studies Reviewed:    The following studies were reviewed today: Venous doppler 07/20/2019  EKG:  EKG is not ordered today.  The ekg ordered 01/19/2019 demonstrates AF with VR 95, LAD, inferior Qs  Recent Labs: 01/17/2019: ALT 6 01/28/2019: BUN 13; Creatinine, Ser 0.98; Potassium 4.6; Sodium 140  Recent Lipid Panel    Component Value Date/Time   CHOL 205 (H) 06/12/2015 1505   TRIG 274 (H) 06/12/2015 1505   HDL 46 06/12/2015 1505   CHOLHDL 4.5 06/12/2015 1505   VLDL 55 (H) 06/12/2015  1505   LDLCALC 104 06/12/2015 1505    Physical Exam:    VS:  BP 125/75   Pulse (!) 59   Temp 98.4 F (36.9 C)   Ht 6' (1.829 m)   Wt 260 lb (117.9 kg)   SpO2 97%   BMI 35.26 kg/m     Wt Readings from Last 3  Encounters:  08/10/19 260 lb (117.9 kg)  07/20/19 260 lb (117.9 kg)  07/19/19 260 lb (117.9 kg)     GEN: Overweight, chronically ill appearing male presents in a wheel chair, in no acute distress HEENT: Normal NECK: No JVD; No carotid bruits CARDIAC: irregularly irregular, no murmurs, rubs, gallops RESPIRATORY:  Clear to auscultation without rales, wheezing or rhonchi  ABDOMEN: Obese, Soft, non-tender, non-distended MUSCULOSKELETAL:  2+ edema RLE with chronic skin changes, 1+ LLE edema with chronic skin changes No deformity  SKIN: Warm and dry NEUROLOGIC:  Alert and oriented x 3 PSYCHIATRIC:  Normal affect   ASSESSMENT:    Acute DVT (deep venous thrombosis) (HCC) Acute Rt LE DVT 07/19/2019 while the patient's INR was sub therapeutic (compliance)  Long term current use of anticoagulant Coumadin Rx- INR 1.5 today ! Pharmacy following  Factor V Leiden ([place]) .  History of pulmonary embolism 2008  Persistent atrial fibrillation (HCC) Rate controlled on Diltiazem 300 mg  Parkinson's disease ([place]) .  PLAN:    I suggested we increase his Lasix 80mg  MWF and 40 mg other days.  Check BMP in two weeks, virtual f/u 3 weeks.  He tells me he can weigh himself at home, it appears his stable weight is 260 lbs.   I'll review Coumadin crossover recommendations with pharmacy in light of his recent DVT and possible foot surgery next month.   Addendum: Stanley Wright Wright reviewed- of the patient had to have surgery he would require Lovenox crossover.     Medication Adjustments/Labs and Tests Ordered: Current medicines are reviewed at length with the patient today.  Concerns regarding medicines are outlined above.  Orders Placed This Encounter  Procedures  . Basic metabolic  panel   Meds ordered this encounter  Medications  . furosemide (LASIX) 40 MG tablet    Sig: Take 2 tablets (80 mg) on Monday, Wednesday, Friday; 1 tablet (40 mg) all other days.    Dispense:  360 tablet    Refill:  1    Patient Instructions  Medication Instructions:  Increase lasix to 80 mg on Monday, Wednesday, and Friday; then take 40 mg every other day.   *If you need a refill on your cardiac medications before your next appointment, please call your pharmacy*  Lab Work: BMET in 2 weeks. If you have labs (blood work) drawn today and your tests are completely normal, you will receive your results only by: Marland Kitchen MyChart Message (if you have MyChart) OR . A paper copy in the mail If you have any lab test that is abnormal or we need to change your treatment, we will call you to review the results.  Follow-Up: At [facility], you and your health needs are our priority.  As part of our continuing mission to provide you with exceptional heart care, we have created designated Provider Care Teams.  These Care Teams include your primary Cardiologist (physician) and Advanced Practice Providers (APPs -  Physician Assistants and Nurse Practitioners) who all work together to provide you with the care you need, when you need it.  Your next appointment:   3 week(s)  The format for your next appointment:   Virtual Visit   Provider:    Kerin Ransom, PA        Signed, Kerin Ransom, [place]  08/10/2019 2:31 PM    [place]

## 2019-08-10 NOTE — Patient Instructions (Signed)
Take 2 tablets today, then continue taking 1 tablet daily except 1.5 tablets on Fridays. Recheck INR in 1 week. Call with any questions if gets any new medications or scheduled for any procedures  [phone]

## 2019-08-10 NOTE — Patient Instructions (Signed)
Medication Instructions:  Increase lasix to 80 mg on Monday, Wednesday, and Friday; then take 40 mg every other day.   *If you need a refill on your cardiac medications before your next appointment, please call your pharmacy*  Lab Work: BMET in 2 weeks. If you have labs (blood work) drawn today and your tests are completely normal, you will receive your results only by: Marland Kitchen MyChart Message (if you have MyChart) OR . A paper copy in the mail If you have any lab test that is abnormal or we need to change your treatment, we will call you to review the results.  Follow-Up: At [facility], you and your health needs are our priority.  As part of our continuing mission to provide you with exceptional heart care, we have created designated Provider Care Teams.  These Care Teams include your primary Cardiologist (physician) and Advanced Practice Providers (APPs -  Physician Assistants and Nurse Practitioners) who all work together to provide you with the care you need, when you need it.  Your next appointment:   3 week(s)  The format for your next appointment:   Virtual Visit   Provider:    Kerin Ransom, [place]

## 2019-08-10 NOTE — Assessment & Plan Note (Signed)
Rate controlled on Diltiazem 300 mg

## 2019-08-10 NOTE — Assessment & Plan Note (Signed)
Acute Rt LE DVT 07/19/2019 while the patient's INR was sub therapeutic (compliance)

## 2019-08-15 ENCOUNTER — Other Ambulatory Visit: Payer: Self-pay

## 2019-08-17 ENCOUNTER — Other Ambulatory Visit: Payer: Self-pay

## 2019-08-17 ENCOUNTER — Ambulatory Visit (INDEPENDENT_AMBULATORY_CARE_PROVIDER_SITE_OTHER): Payer: PPO | Admitting: *Deleted

## 2019-08-17 DIAGNOSIS — D6851 Activated protein C resistance: Secondary | ICD-10-CM

## 2019-08-17 DIAGNOSIS — Z5181 Encounter for therapeutic drug level monitoring: Secondary | ICD-10-CM | POA: Diagnosis not present

## 2019-08-17 DIAGNOSIS — I4819 Other persistent atrial fibrillation: Secondary | ICD-10-CM

## 2019-08-17 LAB — POCT INR: INR: 2.1 (ref 2.0–3.0)

## 2019-08-17 NOTE — Patient Instructions (Signed)
Description   Continue taking 1 tablet daily except 1.5 tablets on Fridays. Recheck INR in 2 weeks. Call with any questions if gets any new medications or scheduled for any procedures  [phone]

## 2019-08-18 ENCOUNTER — Encounter: Payer: Self-pay | Admitting: Orthopedic Surgery

## 2019-08-18 ENCOUNTER — Telehealth: Payer: Self-pay | Admitting: *Deleted

## 2019-08-18 ENCOUNTER — Encounter: Payer: Self-pay | Admitting: *Deleted

## 2019-08-18 MED ORDER — ENOXAPARIN SODIUM 120 MG/0.8ML ~~LOC~~ SOLN: 120.0000 mg | Freq: Two times a day (BID) | SUBCUTANEOUS | 0 refills | Status: DC

## 2019-08-18 NOTE — Telephone Encounter (Addendum)
  Pt seen yesterday for anticoagulation visit:  INR: 2.1 (08/17/2019), Weight: 117.9kg, Scr: 0.98 (01/28/2019), CrCl: 110 ml/min. Procedure: 08/24/2019,  Right Talo-Navicular Fusion. Per Erasmo Downer Pharmacist pt needs bridge. Instructions reviewed by Crista Curb   Bridge Instructions:  Please call [facility] as soon as you get this message at [phone] to review Lovenox  bridge instructions and to make appointment on 08/30/2019 to get your INR checked.   08/18/2019: Last dose of Coumadin.  08/19/2019: No Coumadin or Lovenox.  08/20/2019: Inject Lovenox 120 mg in the fatty abdominal tissue at least 2 inches from the belly button twice a day about 12 hours apart, 8am and 8pm rotate sites. No Coumadin.  08/21/2019: Inject Lovenox in the fatty tissue every 12 hours, 8am and 8pm. No Coumadin.  08/22/2019: Inject Lovenox in the fatty tissue every 12 hours, 8am and 8pm. No Coumadin.  08/23/2019: Inject Lovenox in the fatty tissue in the morning at 8 am (No PM dose). No Coumadin.  08/24/2019: Procedure Day - No Lovenox - Resume Coumadin in the evening or as directed by doctor (take an extra half tablet with usual dose for 2 days then resume normal dose).  08/25/2019: Resume Lovenox inject in the fatty tissue every 12 hours, (8am and 8pm) and take Coumadin.  08/26/2019: Inject Lovenox in the fatty tissue every 12 hours (8am and 8pm) and take Coumadin.  08/27/2019: Inject Lovenox in the fatty tissue every 12 hours (8am and 8pm) and take Coumadin.  08/28/2019: Inject Lovenox in the fatty tissue every 12 hours (8am and 8 pm) and take Coumadin.  08/29/2019: Inject Lovenox in the fatty tissue every 12 hours (8am and 8pm) and take Coumadin.  08/30/2019: Inject Lovenox in the fatty tissue at 8am and come to appointment to get INR checked.

## 2019-08-18 NOTE — Telephone Encounter (Signed)
Have tried to contact pt via phone without success. Ebony Hail, RN sent him the Lovenox instructions via MyChart. Pt needs to call back to reschedule the follow-up appt since he will be using Lovenox post procedure. Will await a return call back.

## 2019-08-19 NOTE — Telephone Encounter (Addendum)
Pt called back and went over the instructions with the pt. He ensured he understood instructions and was comfortable given the injections. Made an  appt for 3/16/20201 at 1245pm for the pt. He is going to pick up injections at this time. He is aware he will take both Lovenox and Warfarin together post procedure as the Warfarin has to build up in his system and the Lovenox will protect him while it is doing so and lower the risk of having a blood clot or stroke.Pt states he does not have any more questions and he was grateful for the assistance. Advised pt to call back if he thinks of anything before 5pm-closing time so we assist.

## 2019-08-22 ENCOUNTER — Other Ambulatory Visit (HOSPITAL_COMMUNITY)
Admission: RE | Admit: 2019-08-22 | Discharge: 2019-08-22 | Disposition: A | Discharge status: Home / Self Care | Payer: PPO | Source: Ambulatory Visit | Attending: Orthopedic Surgery | Admitting: Orthopedic Surgery

## 2019-08-22 DIAGNOSIS — Z01812 Encounter for preprocedural laboratory examination: Secondary | ICD-10-CM | POA: Diagnosis not present

## 2019-08-22 DIAGNOSIS — Z20822 Contact with and (suspected) exposure to covid-19: Secondary | ICD-10-CM | POA: Diagnosis not present

## 2019-08-23 ENCOUNTER — Other Ambulatory Visit: Payer: Self-pay

## 2019-08-23 ENCOUNTER — Encounter (HOSPITAL_COMMUNITY): Payer: Self-pay | Admitting: Orthopedic Surgery

## 2019-08-23 LAB — SARS CORONAVIRUS 2 (TAT 6-24 HRS): SARS Coronavirus 2: NEGATIVE

## 2019-08-23 MED ORDER — DEXTROSE 5 % IV SOLN
3.0000 g | INTRAVENOUS | Status: AC
  Administered 2019-08-24: 3 g via INTRAVENOUS
  Filled 2019-08-23: qty 3

## 2019-08-23 NOTE — Progress Notes (Signed)
Pt denies SOB and chest pain. Pt stated that he is under the care of Dr. Stanford Breed, Cardiology. Pt stated that PCP is [employer], [place]. Pt denies having a cardiac cath. Pt denies recent labs. Pt stated that surgeon advised that he not bridge with Lovenox and continue taking Coumadin because " there will be minimal bleeding." Pt denies recent labs. Pt made aware to stop taking  vitamins, fish oil and herbal medications. Do not take any NSAIDs ie: Ibuprofen, Advil, Naproxen (Aleve), Motrin, BC and Goody Powder. Pt reminded to quarantine. Pt verbalized understanding of all pre-op instructions. See PA, Anesthesiology, note.

## 2019-08-23 NOTE — Anesthesia Preprocedure Evaluation (Addendum)
Anesthesia Evaluation  Patient identified by MRN, date of birth, ID band Patient awake    Reviewed: Allergy & Precautions, NPO status , Patient's Chart, lab work & pertinent test results  Airway Mallampati: I  TM Distance: >3 FB Neck ROM: Full    Dental   Pulmonary sleep apnea , former smoker,    Pulmonary exam normal        Cardiovascular Normal cardiovascular exam+ dysrhythmias Atrial Fibrillation      Neuro/Psych    GI/Hepatic GERD  Medicated and Controlled,  Endo/Other    Renal/GU      Musculoskeletal   Abdominal   Peds  Hematology   Anesthesia Other Findings   Reproductive/Obstetrics                             Anesthesia Physical Anesthesia Plan  ASA: III  Anesthesia Plan: General   Post-op Pain Management:  Regional for Post-op pain   Induction:   PONV Risk Score and Plan: 2 and Ondansetron and Treatment may vary due to age or medical condition  Airway Management Planned: LMA  Additional Equipment:   Intra-op Plan:   Post-operative Plan: Extubation in OR  Informed Consent: I have reviewed the patients History and Physical, chart, labs and discussed the procedure including the risks, benefits and alternatives for the proposed anesthesia with the patient or authorized representative who has indicated his/her understanding and acceptance.       Plan Discussed with: CRNA and Surgeon  Anesthesia Plan Comments: (PAT note written 08/23/2019 by Myra Gianotti, PA-C. History Parkinson's disease, PE (last 2008), DVT (last 07/20/19), Factor 5 Leiden mutation, chronic afib, OSA. Cardiologist is Dr. Stanford Breed. Lovenox bridge recommended if warfarin held for surgery, but Dr. Sharol Given is having patient remain on warfarin perioperatively.     )      Anesthesia Quick Evaluation

## 2019-08-23 NOTE — Progress Notes (Addendum)
Anesthesia Chart Review: Stanley Wright    Case: [id number] Date/Time: 08/24/19 0815   Procedure: RIGHT TALO-NAVICULAR FUSION (Right Ankle)   Anesthesia type: Choice   Pre-op diagnosis: Osteoarthritis Right Foot   Location: [facility] OR ROOM 03 / [place] OR   Surgeons: Newt Minion, MD      DISCUSSION: Patient is a 74 year old male scheduled for the above procedure.   History includes former smoker (quit 1987), Parkinson's disease, PE (~ 2000, 03/03/07), DVT (2000, 2/), Factor 5 Leiden mutation, ITP (2003), afib (s/p DCCV 11/22/15, recurrent afib by 02/2016), dyspnea, OSA, BPH, GERD, hiatal hernia, psoriasis, diverticulitis, skin cancer ([place]), hearing aids, partial lung resection (~ 2004; for histoplasmosis? By 11/2009 notes).   Last cardiology evaluation 08/10/19 by Kerin Ransom, PA-C. Afib rate controlled. Acute RLE DVT 07/20/19 while INR subtherapeutic. He reviewed warfarin instructions with pharmacy and Dr. Stanford Breed in light of possible foot surgery in March. Lovenox bridge was recommended--instructions outlined in 08/18/19 telephone encounter by Johny Shock, RN. ([place] 08/23/19 1:23 PM: Patient reports that he is not doing the Lovenox bridge because Dr. Sharol Given is having him stay on warfarin perioperatively.)   Preoperative COVID-19 test negative on 08/22/2019.  He is a same-day work-up, so labs and anesthesia team evaluation on the day of surgery.   VS:   Wt Readings from Last 3 Encounters:  08/10/19 117.9 kg  07/20/19 117.9 kg  07/19/19 117.9 kg   BP Readings from Last 3 Encounters:  08/10/19 125/75  07/20/19 110/70  07/19/19 (!) 159/110   Pulse Readings from Last 3 Encounters:  08/10/19 (!) 59  07/20/19 80  07/19/19 92    PROVIDERS: Scifres, Earlie Server, PA-C is PCP [facility]) - Kirk Ruths, MD is cardiologist  - He was evaluated by pulmonologist Christinia Gully, MD in 2018 for dyspnea on exertion and OSA. DOE symptoms felt "markedly disproportionate to objective  findings and not clear this is a lung problem..." Consider anxiety, allergy work-up or CT sinuses, treatment for reflux. No obvious CHF (ongoing cardiology follow-up). In regards to OSA, he had inconsistent use of CPAP mask, so consider different mask or sleep specialist referral.  Alonza Bogus, DO is neurologist. Last evaluation 05/02/19.    LABS: He is for labs on the day of surgery.   OTHER:  As outlined in pulmonary note by Dr. Melvyn Novas 08/23/16: - 08/22/2016  Walked RA x 3 laps @ 185 ft each stopped due to  End of study, fast pace, no sob or desat    - Spirometry 08/22/2016  Nl s curvature    IMAGES: Xray right foot 03/18/19: IMPRESSION: Moderate talonavicular joint osteoarthritis. An osseous fragment superior to the navicular bone may represent a fracture fragment.   EKG: 01/19/19 ([facility]): Atrial fibrillation at 96 bpm. LAD. Inferior infarct (age undetermined)   CV: BLE venous US 07/20/19: Summary:  RIGHT:  - Findings consistent with acute deep vein thrombosis involving the right  femoral vein, right popliteal vein, right posterior tibial veins, and  right peroneal veins.  - No cystic structure found in the popliteal fossa.  LEFT:  - No evidence of deep vein thrombosis in the lower extremity. No indirect  evidence of obstruction proximal to the inguinal ligament.  - No cystic structure found in the popliteal fossa.   Echo 03/12/18: Study Conclusions  - Left ventricle: The cavity size was normal. Systolic function was  normal. The estimated ejection fraction was in the range of 55%  to 60%. Wall motion  was normal; there were no regional wall  motion abnormalities. The study is not technically sufficient to  allow evaluation of LV diastolic function.  - Left atrium: The atrium was mildly to moderately dilated.  - Right ventricle: The cavity size was mildly dilated. Wall  thickness was normal.  - Pulmonary arteries: PA peak pressure: 31 mm Hg (S).   Myocardial  Perfusion Imaging 08/07/16:  There was no ST segment deviation noted during stress.  The study is normal.  This is a low risk study.  This study was not gated due to atrial fibrillation.   24 hour Holter monitor 03/04/16: - Afib with pvcs or aberrantly conducted beats   Past Medical History:  Diagnosis Date  . Arthritis   . BENIGN PROSTATIC HYPERTROPHY, WITH OBSTRUCTION   . Cancer (HCC)    skin, basal, squamous  . COLONIC POLYPS   . Diverticulitis   . DVT (deep venous thrombosis) ([place]) 2000  . Dysrhythmia    Hx Afib- 2017  . Early cataract   . Factor 5 Leiden mutation, heterozygous ([place])   . GERD (gastroesophageal reflux disease)    not on medication  . Hearing aid worn    B/L  . HIATAL HERNIA   . ITP (idiopathic thrombocytopenic purpura) 2003  . Long term current use of anticoagulant   . Osteoarthritis    right foot  . Parkinson's disease ([place]) 02/03/2018  . PSORIASIS   . Pulmonary embolus ([place]) 2000  . Shortness of breath dyspnea    at times - Talking alot  . Sleep apnea   . Wears glasses     Past Surgical History:  Procedure Laterality Date  . CARDIOVERSION N/A 11/22/2015   Procedure: CARDIOVERSION;  Surgeon: Sanda Klein, MD;  [facility];  Service: Cardiovascular;  Laterality: N/A;  . COLONOSCOPY    . HERNIA REPAIR Left    Inguinal- x2 . mesh x1  . INSERTION OF MESH N/A 02/25/2016   Procedure: INSERTION OF MESH;  Surgeon: Rolm Bookbinder, MD;  Location: [place];  Service: General;  Laterality: N/A;  . LUNG REMOVAL, PARTIAL Right 2004   was fungal and not cancerous  . PROSTATE SURGERY    . UMBILICAL HERNIA REPAIR N/A 02/25/2016   Procedure: LAPAROSCOPIC UMBILICAL HERNIA REPAIR;  Surgeon: Rolm Bookbinder, MD;  Location: [place];  Service: General;  Laterality: N/A;    MEDICATIONS: . [START ON 08/24/2019] ceFAZolin (ANCEF) 3 g in dextrose 5 % 50 mL IVPB   . carbidopa-levodopa (SINEMET CR) 50-200 MG tablet  . carbidopa-levodopa (SINEMET IR)  25-100 MG tablet  . CARTIA XT 300 MG 24 hr capsule  . furosemide (LASIX) 40 MG tablet  . warfarin (COUMADIN) 5 MG tablet  . enoxaparin (LOVENOX) 120 MG/0.8ML injection     Myra Gianotti, PA-C Surgical Short Stay/Anesthesiology [facility] Phone [phone] [facility] Phone [phone] 08/23/2019 1:07 PM

## 2019-08-24 ENCOUNTER — Encounter (HOSPITAL_COMMUNITY): Payer: Self-pay | Admitting: Orthopedic Surgery

## 2019-08-24 ENCOUNTER — Telehealth: Payer: Self-pay | Admitting: Radiology

## 2019-08-24 ENCOUNTER — Ambulatory Visit (HOSPITAL_COMMUNITY): Payer: PPO | Admitting: Certified Registered"

## 2019-08-24 ENCOUNTER — Ambulatory Visit (HOSPITAL_COMMUNITY): Payer: PPO

## 2019-08-24 ENCOUNTER — Ambulatory Visit (HOSPITAL_COMMUNITY)
Admission: RE | Admit: 2019-08-24 | Discharge: 2019-08-24 | Disposition: A | Discharge status: Home / Self Care | Payer: PPO | Attending: Orthopedic Surgery | Admitting: Orthopedic Surgery

## 2019-08-24 ENCOUNTER — Encounter (HOSPITAL_COMMUNITY)
Admission: RE | Disposition: A | Discharge status: Home / Self Care | Payer: Self-pay | Source: Home / Self Care | Attending: Orthopedic Surgery

## 2019-08-24 ENCOUNTER — Other Ambulatory Visit: Payer: Self-pay

## 2019-08-24 DIAGNOSIS — I4891 Unspecified atrial fibrillation: Secondary | ICD-10-CM | POA: Diagnosis not present

## 2019-08-24 DIAGNOSIS — Z79899 Other long term (current) drug therapy: Secondary | ICD-10-CM | POA: Insufficient documentation

## 2019-08-24 DIAGNOSIS — G4730 Sleep apnea, unspecified: Secondary | ICD-10-CM | POA: Diagnosis not present

## 2019-08-24 DIAGNOSIS — Z7901 Long term (current) use of anticoagulants: Secondary | ICD-10-CM | POA: Insufficient documentation

## 2019-08-24 DIAGNOSIS — Z87891 Personal history of nicotine dependence: Secondary | ICD-10-CM | POA: Diagnosis not present

## 2019-08-24 DIAGNOSIS — G20 Parkinson's disease: Secondary | ICD-10-CM | POA: Diagnosis not present

## 2019-08-24 DIAGNOSIS — Z86718 Personal history of other venous thrombosis and embolism: Secondary | ICD-10-CM | POA: Insufficient documentation

## 2019-08-24 DIAGNOSIS — M19071 Primary osteoarthritis, right ankle and foot: Secondary | ICD-10-CM | POA: Diagnosis not present

## 2019-08-24 DIAGNOSIS — Z8249 Family history of ischemic heart disease and other diseases of the circulatory system: Secondary | ICD-10-CM | POA: Insufficient documentation

## 2019-08-24 DIAGNOSIS — G8918 Other acute postprocedural pain: Secondary | ICD-10-CM | POA: Diagnosis not present

## 2019-08-24 DIAGNOSIS — Z86711 Personal history of pulmonary embolism: Secondary | ICD-10-CM | POA: Diagnosis not present

## 2019-08-24 DIAGNOSIS — D6851 Activated protein C resistance: Secondary | ICD-10-CM | POA: Diagnosis not present

## 2019-08-24 DIAGNOSIS — I4819 Other persistent atrial fibrillation: Secondary | ICD-10-CM | POA: Diagnosis not present

## 2019-08-24 DIAGNOSIS — Z886 Allergy status to analgesic agent status: Secondary | ICD-10-CM | POA: Diagnosis not present

## 2019-08-24 DIAGNOSIS — K219 Gastro-esophageal reflux disease without esophagitis: Secondary | ICD-10-CM | POA: Insufficient documentation

## 2019-08-24 HISTORY — PX: ANKLE FUSION: SHX5718

## 2019-08-24 HISTORY — DX: Presence of spectacles and contact lenses: Z97.3

## 2019-08-24 HISTORY — DX: Unspecified osteoarthritis, unspecified site: M19.90

## 2019-08-24 HISTORY — DX: Presence of external hearing-aid: Z97.4

## 2019-08-24 HISTORY — DX: Unspecified cataract: H26.9

## 2019-08-24 LAB — BASIC METABOLIC PANEL
Anion gap: 9 (ref 5–15)
BUN: 17 mg/dL (ref 8–23)
CO2: 26 mmol/L (ref 22–32)
Calcium: 8.7 mg/dL — ABNORMAL LOW (ref 8.9–10.3)
Chloride: 105 mmol/L (ref 98–111)
Creatinine, Ser: 1.02 mg/dL (ref 0.61–1.24)
GFR calc Af Amer: >60 mL/min (ref >60)
GFR calc non Af Amer: >60 mL/min (ref >60)
Glucose, Bld: 95 mg/dL (ref 70–99)
Potassium: 3.7 mmol/L (ref 3.5–5.1)
Sodium: 140 mmol/L (ref 135–145)

## 2019-08-24 LAB — PROTIME-INR
INR: 2.1 — ABNORMAL HIGH (ref 0.8–1.2)
Prothrombin Time: 23.3 seconds — ABNORMAL HIGH (ref 11.4–15.2)

## 2019-08-24 LAB — CBC
HCT: 43.7 % (ref 39.0–52.0)
Hemoglobin: 14.1 g/dL (ref 13.0–17.0)
MCH: 32.3 pg (ref 26.0–34.0)
MCHC: 32.3 g/dL (ref 30.0–36.0)
MCV: 100.2 fL — ABNORMAL HIGH (ref 80.0–100.0)
Platelets: 169 10*3/uL (ref 150–400)
RBC: 4.36 MIL/uL (ref 4.22–5.81)
RDW: 13.8 % (ref 11.5–15.5)
WBC: 4.7 10*3/uL (ref 4.0–10.5)
nRBC: 0 % (ref 0.0–0.2)

## 2019-08-24 SURGERY — ANKLE FUSION
Anesthesia: General | Site: Ankle | Laterality: Right

## 2019-08-24 MED ORDER — OXYCODONE HCL 5 MG PO TABS: 5.0000 mg | ORAL_TABLET | Freq: Once | ORAL | Status: DC

## 2019-08-24 MED ORDER — LACTATED RINGERS IV SOLN: INTRAVENOUS | Status: DC | PRN

## 2019-08-24 MED ORDER — LIDOCAINE 2% (20 MG/ML) 5 ML SYRINGE
INTRAMUSCULAR | Status: AC
  Filled 2019-08-24: qty 5

## 2019-08-24 MED ORDER — PHENYLEPHRINE 40 MCG/ML (10ML) SYRINGE FOR IV PUSH (FOR BLOOD PRESSURE SUPPORT)
PREFILLED_SYRINGE | INTRAVENOUS | Status: DC | PRN
  Administered 2019-08-24 (×2): 80 ug via INTRAVENOUS
  Administered 2019-08-24: 40 ug via INTRAVENOUS

## 2019-08-24 MED ORDER — 0.9 % SODIUM CHLORIDE (POUR BTL) OPTIME
TOPICAL | Status: DC | PRN
  Administered 2019-08-24: 1000 mL

## 2019-08-24 MED ORDER — ROCURONIUM BROMIDE 10 MG/ML (PF) SYRINGE
PREFILLED_SYRINGE | INTRAVENOUS | Status: AC
  Filled 2019-08-24: qty 10

## 2019-08-24 MED ORDER — MIDAZOLAM HCL 5 MG/5ML IJ SOLN: INTRAMUSCULAR | Status: DC | PRN

## 2019-08-24 MED ORDER — ONDANSETRON HCL 4 MG/2ML IJ SOLN
INTRAMUSCULAR | Status: DC | PRN
  Administered 2019-08-24: 4 mg via INTRAVENOUS

## 2019-08-24 MED ORDER — DEXAMETHASONE SODIUM PHOSPHATE 4 MG/ML IJ SOLN
INTRAMUSCULAR | Status: DC | PRN
  Administered 2019-08-24: 10 mg via INTRAVENOUS

## 2019-08-24 MED ORDER — OXYCODONE HCL 5 MG PO TABS: 5.0000 mg | ORAL_TABLET | ORAL | 0 refills | Status: DC | PRN

## 2019-08-24 MED ORDER — MEPERIDINE HCL 25 MG/ML IJ SOLN: 6.2500 mg | INTRAMUSCULAR | Status: DC | PRN

## 2019-08-24 MED ORDER — MIDAZOLAM HCL 5 MG/5ML IJ SOLN
INTRAMUSCULAR | Status: DC | PRN
  Administered 2019-08-24: 2 mg via INTRAVENOUS

## 2019-08-24 MED ORDER — ONDANSETRON HCL 4 MG/2ML IJ SOLN: 4.0000 mg | Freq: Once | INTRAMUSCULAR | Status: DC | PRN

## 2019-08-24 MED ORDER — LIDOCAINE-EPINEPHRINE (PF) 1.5 %-1:200000 IJ SOLN
INTRAMUSCULAR | Status: DC | PRN
  Administered 2019-08-24: 30 mL via PERINEURAL

## 2019-08-24 MED ORDER — LIDOCAINE 2% (20 MG/ML) 5 ML SYRINGE
INTRAMUSCULAR | Status: DC | PRN
  Administered 2019-08-24: 100 mg via INTRAVENOUS

## 2019-08-24 MED ORDER — PROPOFOL 10 MG/ML IV BOLUS
INTRAVENOUS | Status: DC | PRN
  Administered 2019-08-24: 100 mg via INTRAVENOUS

## 2019-08-24 MED ORDER — DEXAMETHASONE SODIUM PHOSPHATE 10 MG/ML IJ SOLN
INTRAMUSCULAR | Status: AC
  Filled 2019-08-24: qty 1

## 2019-08-24 MED ORDER — PHENYLEPHRINE HCL-NACL 10-0.9 MG/250ML-% IV SOLN
INTRAVENOUS | Status: DC | PRN
  Administered 2019-08-24: 50 ug/min via INTRAVENOUS

## 2019-08-24 MED ORDER — HYDROMORPHONE HCL 1 MG/ML IJ SOLN
INTRAMUSCULAR | Status: AC
  Administered 2019-08-24: 0.5 mg via INTRAVENOUS
  Filled 2019-08-24: qty 1

## 2019-08-24 MED ORDER — FENTANYL CITRATE (PF) 250 MCG/5ML IJ SOLN
INTRAMUSCULAR | Status: AC
  Filled 2019-08-24: qty 5

## 2019-08-24 MED ORDER — PROPOFOL 10 MG/ML IV BOLUS
INTRAVENOUS | Status: AC
  Filled 2019-08-24: qty 40

## 2019-08-24 MED ORDER — CHLORHEXIDINE GLUCONATE 4 % EX LIQD: 60.0000 mL | Freq: Once | CUTANEOUS | Status: DC

## 2019-08-24 MED ORDER — MIDAZOLAM HCL 2 MG/2ML IJ SOLN
INTRAMUSCULAR | Status: AC
  Filled 2019-08-24: qty 2

## 2019-08-24 MED ORDER — BUPIVACAINE-EPINEPHRINE (PF) 0.5% -1:200000 IJ SOLN
INTRAMUSCULAR | Status: DC | PRN
  Administered 2019-08-24: 30 mL via PERINEURAL

## 2019-08-24 MED ORDER — FENTANYL CITRATE (PF) 100 MCG/2ML IJ SOLN
INTRAMUSCULAR | Status: DC | PRN
  Administered 2019-08-24 (×4): 50 ug via INTRAVENOUS

## 2019-08-24 MED ORDER — HYDROMORPHONE HCL 1 MG/ML IJ SOLN
0.2500 mg | INTRAMUSCULAR | Status: DC | PRN
  Administered 2019-08-24 (×2): 0.5 mg via INTRAVENOUS

## 2019-08-24 MED ORDER — ONDANSETRON HCL 4 MG/2ML IJ SOLN
INTRAMUSCULAR | Status: AC
  Filled 2019-08-24: qty 2

## 2019-08-24 SURGICAL SUPPLY — 45 items
BANDAGE ESMARK 6X9 LF (GAUZE/BANDAGES/DRESSINGS) ×1 IMPLANT
BLADE AVERAGE 25X9 (BLADE) ×2 IMPLANT
BLADE SAW SGTL MED 73X18.5 STR (BLADE) ×2 IMPLANT
BLADE SURG 10 STRL SS (BLADE) ×2 IMPLANT
BNDG CMPR 9X6 STRL LF SNTH (GAUZE/BANDAGES/DRESSINGS) ×1
BNDG COHESIVE 4X5 TAN STRL (GAUZE/BANDAGES/DRESSINGS) ×2 IMPLANT
BNDG COHESIVE 6X5 TAN STRL LF (GAUZE/BANDAGES/DRESSINGS) ×1 IMPLANT
BNDG ESMARK 6X9 LF (GAUZE/BANDAGES/DRESSINGS) ×2
BNDG GAUZE ELAST 4 BULKY (GAUZE/BANDAGES/DRESSINGS) ×3 IMPLANT
BUR 4.0 RND (BURR) ×1 IMPLANT
COVER MAYO STAND STRL (DRAPES) ×1 IMPLANT
COVER SURGICAL LIGHT HANDLE (MISCELLANEOUS) ×4 IMPLANT
COVER WAND RF STERILE (DRAPES) ×2 IMPLANT
DRAPE OEC MINIVIEW 54X84 (DRAPES) ×1 IMPLANT
DRAPE U-SHAPE 47X51 STRL (DRAPES) ×2 IMPLANT
DRSG ADAPTIC 3X8 NADH LF (GAUZE/BANDAGES/DRESSINGS) ×2 IMPLANT
DRSG EMULSION OIL 3X3 NADH (GAUZE/BANDAGES/DRESSINGS) ×1 IMPLANT
DURAPREP 26ML APPLICATOR (WOUND CARE) ×2 IMPLANT
ELECT REM PT RETURN 9FT ADLT (ELECTROSURGICAL) ×2
ELECTRODE REM PT RTRN 9FT ADLT (ELECTROSURGICAL) ×1 IMPLANT
GAUZE SPONGE 4X4 12PLY STRL (GAUZE/BANDAGES/DRESSINGS) ×2 IMPLANT
GLOVE BIOGEL PI IND STRL 9 (GLOVE) ×1 IMPLANT
GLOVE BIOGEL PI INDICATOR 9 (GLOVE) ×1
GLOVE SURG ORTHO 9.0 STRL STRW (GLOVE) ×2 IMPLANT
GOWN STRL REUS W/ TWL LRG LVL3 (GOWN DISPOSABLE) ×1 IMPLANT
GOWN STRL REUS W/ TWL XL LVL3 (GOWN DISPOSABLE) ×1 IMPLANT
GOWN STRL REUS W/TWL LRG LVL3 (GOWN DISPOSABLE) ×2
GOWN STRL REUS W/TWL XL LVL3 (GOWN DISPOSABLE) ×2
KIT BASIN OR (CUSTOM PROCEDURE TRAY) ×2 IMPLANT
KIT TURNOVER KIT B (KITS) ×2 IMPLANT
NS IRRIG 1000ML POUR BTL (IV SOLUTION) ×2 IMPLANT
PACK ORTHO EXTREMITY (CUSTOM PROCEDURE TRAY) ×2 IMPLANT
PAD ABD 8X10 STRL (GAUZE/BANDAGES/DRESSINGS) ×1 IMPLANT
PAD ARMBOARD 7.5X6 YLW CONV (MISCELLANEOUS) ×4 IMPLANT
PUTTY DBM ALLOSYNC PURE 5CC (Putty) ×1 IMPLANT
SPONGE LAP 18X18 RF (DISPOSABLE) ×2 IMPLANT
STAPLE SUPERMX NITI 20X15 (Staple) ×1 IMPLANT
STAPLE SUPERMX NITI 20X20 (Staple) ×3 IMPLANT
SUCTION FRAZIER HANDLE 10FR (MISCELLANEOUS) ×2
SUCTION TUBE FRAZIER 10FR DISP (MISCELLANEOUS) ×1 IMPLANT
SUT ETHILON 2 0 PSLX (SUTURE) ×5 IMPLANT
TOWEL GREEN STERILE (TOWEL DISPOSABLE) ×2 IMPLANT
TOWEL GREEN STERILE FF (TOWEL DISPOSABLE) ×2 IMPLANT
TUBE CONNECTING 12X1/4 (SUCTIONS) ×2 IMPLANT
WATER STERILE IRR 1000ML POUR (IV SOLUTION) ×2 IMPLANT

## 2019-08-24 NOTE — Anesthesia Procedure Notes (Signed)
Procedure Name: LMA Insertion Date/Time: 08/24/2019 8:51 AM Performed by: Griffin Dakin, CRNA Pre-anesthesia Checklist: Patient identified, Emergency Drugs available, Suction available and Patient being monitored Patient Re-evaluated:Patient Re-evaluated prior to induction Oxygen Delivery Method: Circle system utilized Preoxygenation: Pre-oxygenation with 100% oxygen Induction Type: IV induction LMA: LMA inserted LMA Size: 5.0 Number of attempts: 1 Placement Confirmation: positive ETCO2 and breath sounds checked- equal and bilateral Tube secured with: Tape Dental Injury: Teeth and Oropharynx as per pre-operative assessment

## 2019-08-24 NOTE — H&P (Signed)
Stanley Wright is an 74 y.o. male.   Chief Complaint: Right foot arthritis HPI:  Patient is a 74 year old gentleman who presents in follow-up for traumatic arthritis talonavicular joint right foot.  Patient states that despite immobilization pressure unloading and conservative treatment he still has persistent pain with activities of daily living.  He currently ambulates with a rolling walker.  Patient complains of increasing swelling in his foot and ankle.  Past Medical History:  Diagnosis Date  . Arthritis   . BENIGN PROSTATIC HYPERTROPHY, WITH OBSTRUCTION   . Cancer (HCC)    skin, basal, squamous  . COLONIC POLYPS   . Diverticulitis   . DVT (deep venous thrombosis) ([place]) 2000  . Dysrhythmia    Hx Afib- 2017  . Early cataract   . Factor 5 Leiden mutation, heterozygous ([place])   . GERD (gastroesophageal reflux disease)    not on medication  . Hearing aid worn    B/L  . HIATAL HERNIA   . ITP (idiopathic thrombocytopenic purpura) 2003  . Long term current use of anticoagulant   . Osteoarthritis    right foot  . Parkinson's disease ([place]) 02/03/2018  . PSORIASIS   . Pulmonary embolus ([place]) 2000  . Shortness of breath dyspnea    at times - Talking alot  . Sleep apnea   . Wears glasses     Past Surgical History:  Procedure Laterality Date  . CARDIOVERSION N/A 11/22/2015   Procedure: CARDIOVERSION;  Surgeon: Sanda Klein, MD;  [facility];  Service: Cardiovascular;  Laterality: N/A;  . COLONOSCOPY    . HERNIA REPAIR Left    Inguinal- x2 . mesh x1  . INSERTION OF MESH N/A 02/25/2016   Procedure: INSERTION OF MESH;  Surgeon: Rolm Bookbinder, MD;  Location: [facility];  Service: General;  Laterality: N/A;  . LUNG REMOVAL, PARTIAL Right 2004   was fungal and not cancerous  . PROSTATE SURGERY    . UMBILICAL HERNIA REPAIR N/A 02/25/2016   Procedure: LAPAROSCOPIC UMBILICAL HERNIA REPAIR;  Surgeon: Rolm Bookbinder, MD;  [facility];  Service: General;  Laterality: N/A;     Family History  Problem Relation Age of Onset  . Cancer Father   . Breast cancer Sister   . Heart disease Brother   . Cancer Maternal Grandmother   . Stroke Paternal Grandfather    Social History:  reports that he quit smoking about 34 years ago. He smoked 1.00 pack per day. He has never used smokeless tobacco. He reports that he does not drink alcohol or use drugs.  Allergies:  Allergies  Allergen Reactions  . Tylenol [Acetaminophen] Other (See Comments)    Pt states he had a DNA test completed that stated he should never take tylenol.    Medications Prior to Admission  Medication Sig Dispense Refill  . carbidopa-levodopa (SINEMET CR) 50-200 MG tablet TAKE 1 TABLET BY MOUTH AT BEDTIME 90 tablet 0  . carbidopa-levodopa (SINEMET IR) 25-100 MG tablet 1 tablet 4-5 times per day (Patient taking differently: Take 1 tablet by mouth 5 (five) times daily. ) 450 tablet 1  . CARTIA XT 300 MG 24 hr capsule Take 1 capsule by mouth once daily (Patient taking differently: Take 300 mg by mouth daily. ) 90 capsule 0  . furosemide (LASIX) 40 MG tablet Take 2 tablets (80 mg) on Monday, Wednesday, Friday; 1 tablet (40 mg) all other days. (Patient taking differently: Take 40-80 mg by mouth See admin instructions. Take 80 mg on Monday, Wednesday,  Friday and 40 mg all other days.) 360 tablet 1  . warfarin (COUMADIN) 5 MG tablet TAKE 1 TABLET BY MOUTH ONCE DAILY OR  AS  DIRECTED  BY  COUMADIN  CLINIC (Patient taking differently: Take 5 mg by mouth daily. ) 90 tablet 0  . enoxaparin (LOVENOX) 120 MG/0.8ML injection Inject 0.8 mLs (120 mg total) into the skin every 12 (twelve) hours. 16 mL 0    Results for orders placed or performed during the hospital encounter of 08/22/19 (from the past 48 hour(s))  SARS CORONAVIRUS 2 (TAT 6-24 HRS) Nasopharyngeal Nasopharyngeal Swab     Status: None   Collection Time: 08/22/19 10:11 AM   Specimen: Nasopharyngeal Swab  Result Value Ref Range   SARS Coronavirus 2  NEGATIVE NEGATIVE    Comment: (NOTE) SARS-CoV-2 target nucleic acids are NOT DETECTED. The SARS-CoV-2 RNA is generally detectable in upper and lower respiratory specimens during the acute phase of infection. Negative results do not preclude SARS-CoV-2 infection, do not rule out co-infections with other pathogens, and should not be used as the sole basis for treatment or other patient management decisions. Negative results must be combined with clinical observations, patient history, and epidemiological information. The expected result is Negative. Fact Sheet for Patients: SugarRoll.be Fact Sheet for Healthcare Providers: https://www.woods-mathews.com/ This test is not yet approved or cleared by the [place] FDA and  has been authorized for detection and/or diagnosis of SARS-CoV-2 by FDA under an Emergency Use Authorization (EUA). This EUA will remain  in effect (meaning this test can be used) for the duration of the COVID-19 declaration under Section 56 4(b)(1) of the Act, 21 U.S.C. section 360bbb-3(b)(1), unless the authorization is terminated or revoked sooner. Performed at [facility], [place] [address] [place], [place] [address]    No results found.  Review of Systems  All other systems reviewed and are negative.   There were no vitals taken for this visit. Physical Exam  Patient is alert, oriented, no adenopathy, well-dressed, normal affect, normal respiratory effort. Examination patient has a palpable dorsalis pedis pulse the Doppler was used and he has a strong diphasic dorsalis pedis pulse.  Patient has pitting edema in the right lower extremity with brawny skin color changes with chronic venous insufficiency without ulceration.  He is point tender to palpation over the talonavicular joint he does have Achilles tightness.  No rocker-bottom deformity. Lungs Clear Heart RRR Assessment/Plan 1. Primary osteoarthritis, right  ankle and foot     Plan: Due to patient's persistent symptoms and failure of conservative care patient states he would like to proceed with surgical intervention.  Due to his multiple medical problems would plan on surgery at [facility] as an outpatient with fusion of the talonavicular joint.  Risks and benefits were discussed including risk of the wound not healing risk of the bone not healing need for additional surgery.  Patient states he understands wished to proceed at this time.   Bevely Palmer Hildagarde Holleran, PA 08/24/2019, 6:41 AM

## 2019-08-24 NOTE — Telephone Encounter (Signed)
Per [employer], they can only fill oxycodone rx for a 7 day supply since it is his first request for it. Pharmacy says that the original rx was written for 10 day supply.  I advised her that the patient had surgery this morning. --Just an [place]

## 2019-08-24 NOTE — Transfer of Care (Signed)
Immediate Anesthesia Transfer of Care Note  Patient: Stanley Wright  Procedure(s) Performed: RIGHT TALO-NAVICULAR FUSION (Right Ankle)  Patient Location: PACU  Anesthesia Type:General  Level of Consciousness: awake, alert  and oriented  Airway & Oxygen Therapy: Patient Spontanous Breathing and Patient connected to nasal cannula oxygen  Post-op Assessment: Report given to RN and Post -op Vital signs reviewed and stable  Post vital signs: Reviewed and stable  Last Vitals:  Vitals Value Taken Time  BP 110/72 08/24/19 0950  Temp    Pulse 93 08/24/19 0953  Resp 18 08/24/19 0953  SpO2 95 % 08/24/19 0953  Vitals shown include unvalidated device data.  Last Pain:  Vitals:   08/24/19 0725  TempSrc:   PainSc: 0-No pain      Patients Stated Pain Goal: 4 ([id number])  Complications: No apparent anesthesia complications

## 2019-08-24 NOTE — Progress Notes (Signed)
Orthopedic Tech Progress Note Patient Details:  Stanley Wright November 17, 1945 [id number] PACU RN called requesting a POST OP SHOE for patient. Ortho Devices Type of Ortho Device: Postop shoe/boot Ortho Device/Splint Location: RLE Ortho Device/Splint Interventions: Ordered, Application   Post Interventions Patient Tolerated: Well Instructions Provided: Care of device, Adjustment of device   Janit Pagan 08/24/2019, 11:13 AM

## 2019-08-24 NOTE — Op Note (Signed)
08/24/2019  9:50 AM  PATIENT:  Stanley Wright    PRE-OPERATIVE DIAGNOSIS:  Osteoarthritis Right Foot  POST-OPERATIVE DIAGNOSIS:  Same  PROCEDURE:  RIGHT TALO-NAVICULAR FUSION and navicular medial cuneiform fusions. C-arm fluoroscopy to verify fusion. Application of 5 cc allograft bone graft  SURGEON:  Newt Minion, MD  PHYSICIAN ASSISTANT:None ANESTHESIA:   General  PREOPERATIVE INDICATIONS:  Stanley Wright is a  74 y.o. male with a diagnosis of Osteoarthritis Right Foot who failed conservative measures and elected for surgical management.    The risks benefits and alternatives were discussed with the patient preoperatively including but not limited to the risks of infection, bleeding, nerve injury, cardiopulmonary complications, the need for revision surgery, among others, and the patient was willing to proceed.  OPERATIVE IMPLANTS: Arthrex staples x4 allograft bone graft 5 cc  @ENCIMAGES @  OPERATIVE FINDINGS: Degenerative arthritis of the talonavicular and navicular medial cuneiform joint with unstable joints.  Disruption of the capsular integrity.  OPERATIVE PROCEDURE: Patient was brought the operating room after undergoing a regional block he then underwent a general anesthetic.  After adequate levels anesthesia were obtained patient's right lower extremity was prepped using DuraPrep draped into a sterile field a timeout was called.  A dorsal incision was made between the interval of the anterior tibial tendon and the EHL.  Dissection was carried down between the 2 tendons retractors were placed.  Visualization showed degenerative changes of the talonavicular and navicular medial cuneiform joint both joints were unstable with ligamentous laxity.  Using the bar both joints were burred back to bleeding viable subchondral bone.  The wound was irrigated with normal saline and both joints were packed with bone graft.  2 staples were then used to secure the talonavicular joint and 2  staples were used to secure the navicular medial cuneiform joint.  See arthroscopy verified alignment.  The incision was closed using 2-0 nylon.  Due to the patient's significant venous insufficiency a compression wrap was applied from the metatarsal heads up to the tibial tubercle patient was extubated taken the PACU in stable condition.   DISCHARGE PLANNING:  Antibiotic duration: Preoperative antibiotics  Weightbearing: Touchdown weightbearing on the heel  Pain medication: Prescription for Percocet  Dressing care/ Wound VAC: Follow-up in the office in 1 week to change the compression wraps  Ambulatory devices: Walker or crutches or wheelchair  Discharge to: Home.  Follow-up: In the office 1 week post operative.

## 2019-08-24 NOTE — Anesthesia Postprocedure Evaluation (Signed)
Anesthesia Post Note  Patient: PAYDEN VALLELY  Procedure(s) Performed: RIGHT TALO-NAVICULAR FUSION (Right Ankle)     Patient location during evaluation: PACU Anesthesia Type: General Level of consciousness: awake and alert Pain management: pain level controlled Vital Signs Assessment: post-procedure vital signs reviewed and stable Respiratory status: spontaneous breathing, nonlabored ventilation, respiratory function stable and patient connected to nasal cannula oxygen Cardiovascular status: blood pressure returned to baseline and stable Postop Assessment: no apparent nausea or vomiting Anesthetic complications: no    Last Vitals:  Vitals:   08/24/19 1035 08/24/19 1050  BP: 110/82 109/80  Pulse: 76 91  Resp: 16 14  Temp:    SpO2: 90% (!) 85%    Last Pain:  Vitals:   08/24/19 1050  TempSrc:   PainSc: 5                  Belford Pascucci DAVID

## 2019-08-24 NOTE — Telephone Encounter (Signed)
Noted  

## 2019-08-24 NOTE — Anesthesia Procedure Notes (Signed)
Anesthesia Regional Block: Popliteal block   Pre-Anesthetic Checklist: ,, timeout performed, Correct Patient, Correct Site, Correct Laterality, Correct Procedure, Correct Position, site marked, Risks and benefits discussed,  Surgical consent,  Pre-op evaluation,  At surgeon's request and post-op pain management  Laterality: Right  Prep: chloraprep       Needles:  Injection technique: Single-shot  Needle Type: Echogenic Stimulator Needle     Needle Length: 10cm  Needle Gauge: 21     Additional Needles:   Procedures:, nerve stimulator,,,,,,,   Nerve Stimulator or Paresthesia:  Response: 0.4 mA,   Additional Responses:   Narrative:  Start time: 08/24/2019 8:20 AM End time: 08/24/2019 8:35 AM Injection made incrementally with aspirations every 5 mL.  Performed by: Personally  Anesthesiologist: Lillia Abed, MD  Additional Notes: Monitors applied. Patient sedated. Sterile prep and drape,hand hygiene and sterile gloves were used. Relevant anatomy identified.Needle position confirmed.Local anesthetic injected incrementally after negative aspiration. Local anesthetic spread visualized around nerve(s). Vascular puncture avoided. No complications. Image printed for medical record.The patient tolerated the procedure well.  Additional Saphenous nerve block performed. 15cc Local Anesthetic mixture placed under ultrasonic guidance along the medio-inferior border of the Sartorious muscle 6 inches above the knee.  No Problems encountered.  Lillia Abed MD

## 2019-08-25 ENCOUNTER — Encounter: Payer: Self-pay | Admitting: *Deleted

## 2019-08-26 ENCOUNTER — Ambulatory Visit: Payer: PPO | Admitting: Neurology

## 2019-08-30 ENCOUNTER — Other Ambulatory Visit: Payer: Self-pay

## 2019-08-30 ENCOUNTER — Ambulatory Visit (INDEPENDENT_AMBULATORY_CARE_PROVIDER_SITE_OTHER): Payer: PPO | Admitting: *Deleted

## 2019-08-30 DIAGNOSIS — I4819 Other persistent atrial fibrillation: Secondary | ICD-10-CM

## 2019-08-30 DIAGNOSIS — Z5181 Encounter for therapeutic drug level monitoring: Secondary | ICD-10-CM | POA: Diagnosis not present

## 2019-08-30 LAB — POCT INR: INR: 2.5 (ref 2.0–3.0)

## 2019-08-30 NOTE — Patient Instructions (Signed)
Description    Continue taking 1 tablet daily except 1.5 tablets on Fridays. Recheck INR in 3 weeks. Call with any questions if gets any new medications or scheduled for any procedures  [phone]

## 2019-08-31 ENCOUNTER — Other Ambulatory Visit: Payer: Self-pay | Admitting: Cardiology

## 2019-08-31 ENCOUNTER — Telehealth: Payer: PPO | Admitting: Cardiology

## 2019-09-01 NOTE — Telephone Encounter (Signed)
Rx has been sent to the pharmacy electronically. ° °

## 2019-09-05 ENCOUNTER — Other Ambulatory Visit: Payer: Self-pay

## 2019-09-05 ENCOUNTER — Ambulatory Visit: Payer: Self-pay

## 2019-09-05 ENCOUNTER — Ambulatory Visit (INDEPENDENT_AMBULATORY_CARE_PROVIDER_SITE_OTHER): Payer: PPO | Admitting: Orthopedic Surgery

## 2019-09-05 ENCOUNTER — Ambulatory Visit (INDEPENDENT_AMBULATORY_CARE_PROVIDER_SITE_OTHER): Payer: PPO

## 2019-09-05 ENCOUNTER — Encounter: Payer: Self-pay | Admitting: Orthopedic Surgery

## 2019-09-05 VITALS — Ht 72.0 in | Wt 255.0 lb

## 2019-09-05 DIAGNOSIS — G8929 Other chronic pain: Secondary | ICD-10-CM | POA: Diagnosis not present

## 2019-09-05 DIAGNOSIS — M19071 Primary osteoarthritis, right ankle and foot: Secondary | ICD-10-CM

## 2019-09-05 DIAGNOSIS — M25571 Pain in right ankle and joints of right foot: Secondary | ICD-10-CM | POA: Diagnosis not present

## 2019-09-05 NOTE — Progress Notes (Signed)
Office Visit Note   Patient: Stanley Wright           Date of Birth: 1946-05-17           MRN: [id number] Visit Date: 09/05/2019              Requested by: Maude Leriche, PA-C [place],  [place] [address] PCP: Maude Leriche, PA-C  Chief Complaint  Patient presents with  . Right Ankle - Routine Post Op    08/24/2019 Right Talo-navicular fusion      HPI: Patient is a 74 year old gentleman who is 1 week status post fusion of the talonavicular and navicular medial cuneiform joint.  Interoperatively the medial cuneiform navicular joint was extremely lax with degenerative changes of the talonavicular joint as well.  Patient underwent fusion of both joints.  Patient presents with massive swelling in the right lower extremity there is clear drainage from his foot patient states that the swelling for him as normal.  Assessment & Plan: Visit Diagnoses:  1. Chronic pain of right ankle   2. Primary osteoarthritis, right ankle and foot     Plan: We will apply a Dynaflex compression wrap to the right lower extremity there is no signs of infection at this time follow-up in 1 week to repeat the compression wrap.  Follow-Up Instructions: Return in about 1 week (around 09/12/2019).   Ortho Exam  Patient is alert, oriented, no adenopathy, well-dressed, normal affect, normal respiratory effort. Examination patient has brawny edema in the right leg venous stasis swelling in the foot with clear serosanguineous drainage from the incision from the venous insufficiency there is no cellulitis no signs of infection.  Patient states that the pain in his foot has resolved after surgery.  Discussed the importance of elevation and we will proceed with a Dynaflex wrap today.  Imaging: XR Foot Complete Right  Result Date: 09/05/2019 Three-view radiographs of the right foot shows stable internal fixation of the talonavicular and navicular medial cuneiform joint.  No images are  attached to the encounter.  Labs: Lab Results  Component Value Date   HGBA1C 6.3 11/21/2015   HGBA1C 6.2 06/12/2015   REPTSTATUS 12/24/2017 FINAL 12/23/2017   CULT  12/23/2017    NO GROWTH Performed at [facility], [place] [address], [place], [place] [address]    LABORGA ESCHERICHIA COLI 12/22/2017     Lab Results  Component Value Date   ALBUMIN 4.1 01/17/2019   ALBUMIN 2.9 (L) 12/26/2017   ALBUMIN 3.0 (L) 12/25/2017    Lab Results  Component Value Date   MG 1.9 12/25/2017   No results found for: VD25OH  No results found for: PREALBUMIN CBC EXTENDED Latest Ref Rng & Units 08/24/2019 12/26/2017 12/25/2017  WBC 4.0 - 10.5 K/uL 4.7 4.7 4.8  RBC 4.22 - 5.81 MIL/uL 4.36 4.22 4.47  HGB 13.0 - 17.0 g/dL 14.1 13.6 14.3  HCT 39.0 - 52.0 % 43.7 40.8 43.3  PLT 150 - 400 K/uL 169 118(L) 107(L)  NEUTROABS 1.7 - 7.7 K/uL - - -  LYMPHSABS 0.7 - 4.0 K/uL - - -     Body mass index is 34.58 kg/m.  Orders:  Orders Placed This Encounter  Procedures  . XR Foot Complete Right   No orders of the defined types were placed in this encounter.    Procedures: No procedures performed  Clinical Data: No additional findings.  ROS:  All other systems negative, except as noted in the HPI.  Review of Systems  Objective: Vital Signs: Ht 6' (1.829 m)   Wt 255 lb (115.7 kg)   BMI 34.58 kg/m   Specialty Comments:  No specialty comments available.  PMFS History: Patient Active Problem List   Diagnosis Date Noted  . Primary osteoarthritis, right ankle and foot   . Lower extremity edema 08/10/2019  . Acute DVT (deep venous thrombosis) ([place]) 07/19/2019  . History of pulmonary embolism 07/19/2019  . Parkinson's disease ([place]) 02/03/2018  . Community acquired pneumonia of left lower lobe of lung 12/23/2017  . Sepsis ([place]) 12/23/2017  . Morbid obesity due to excess calories ([place]) 08/23/2016  . OSA on CPAP 08/23/2016  . Dyspnea on exertion 08/22/2016  . Umbilical hernia  [id number]  . Persistent atrial fibrillation ([place])   . Encounter for therapeutic drug monitoring 08/12/2013  . Long term current use of anticoagulant 07/30/2010  . COLONIC POLYPS 06/03/2010  . Factor V Leiden ([place]) 06/03/2010  . GERD 06/03/2010  . HIATAL HERNIA 06/03/2010  . BENIGN PROSTATIC HYPERTROPHY, WITH OBSTRUCTION 06/03/2010  . PSORIASIS 06/03/2010   Past Medical History:  Diagnosis Date  . Arthritis   . BENIGN PROSTATIC HYPERTROPHY, WITH OBSTRUCTION   . Cancer (HCC)    skin, basal, squamous  . COLONIC POLYPS   . Diverticulitis   . DVT (deep venous thrombosis) ([place]) 2000  . Dysrhythmia    Hx Afib- 2017  . Early cataract   . Factor 5 Leiden mutation, heterozygous ([place])   . GERD (gastroesophageal reflux disease)    not on medication  . Hearing aid worn    B/L  . HIATAL HERNIA   . ITP (idiopathic thrombocytopenic purpura) 2003  . Long term current use of anticoagulant   . Osteoarthritis    right foot  . Parkinson's disease ([place]) 02/03/2018  . PSORIASIS   . Pulmonary embolus ([place]) 2000  . Shortness of breath dyspnea    at times - Talking alot  . Sleep apnea   . Wears glasses     Family History  Problem Relation Age of Onset  . Cancer Father   . Breast cancer Sister   . Heart disease Brother   . Cancer Maternal Grandmother   . Stroke Paternal Grandfather     Past Surgical History:  Procedure Laterality Date  . ANKLE FUSION Right 08/24/2019   Procedure: RIGHT TALO-NAVICULAR FUSION;  Surgeon: Newt Minion, MD;  Location: [facility];  Service: Orthopedics;  Laterality: Right;  . CARDIOVERSION N/A 11/22/2015   Procedure: CARDIOVERSION;  Surgeon: Sanda Klein, MD;  [facility];  Service: Cardiovascular;  Laterality: N/A;  . COLONOSCOPY    . HERNIA REPAIR Left    Inguinal- x2 . mesh x1  . INSERTION OF MESH N/A 02/25/2016   Procedure: INSERTION OF MESH;  Surgeon: Rolm Bookbinder, MD;  Location: [facility];  Service: General;  Laterality: N/A;  . LUNG REMOVAL,  PARTIAL Right 2004   was fungal and not cancerous  . PROSTATE SURGERY    . UMBILICAL HERNIA REPAIR N/A 02/25/2016   Procedure: LAPAROSCOPIC UMBILICAL HERNIA REPAIR;  Surgeon: Rolm Bookbinder, MD;  Location: [facility];  Service: General;  Laterality: N/A;   Social History   Occupational History    Employer: RETIRED    Comment: Engineer, structural  Tobacco Use  . Smoking status: Former Smoker    Packs/day: 1.00    Quit date: 06/16/1985    Years since quitting: 34.2  . Smokeless tobacco: Never Used  . Tobacco comment: quit approx age  35-   Substance and Sexual Activity  . Alcohol use: No  . Drug use: No  . Sexual activity: Yes

## 2019-09-12 ENCOUNTER — Ambulatory Visit (INDEPENDENT_AMBULATORY_CARE_PROVIDER_SITE_OTHER): Payer: PPO | Admitting: Physician Assistant

## 2019-09-12 ENCOUNTER — Other Ambulatory Visit: Payer: Self-pay

## 2019-09-12 ENCOUNTER — Encounter: Payer: Self-pay | Admitting: Physician Assistant

## 2019-09-12 DIAGNOSIS — G8929 Other chronic pain: Secondary | ICD-10-CM

## 2019-09-12 DIAGNOSIS — M25571 Pain in right ankle and joints of right foot: Secondary | ICD-10-CM

## 2019-09-12 DIAGNOSIS — I82411 Acute embolism and thrombosis of right femoral vein: Secondary | ICD-10-CM | POA: Diagnosis not present

## 2019-09-12 DIAGNOSIS — I5032 Chronic diastolic (congestive) heart failure: Secondary | ICD-10-CM | POA: Diagnosis not present

## 2019-09-12 NOTE — Progress Notes (Signed)
Office Visit Note   Patient: Stanley Wright           Date of Birth: 04-Jul-1945           MRN: [id number] Visit Date: 09/12/2019              Requested by: Maude Leriche, PA-C [place],  [place] [address] PCP: Maude Leriche, PA-C  Chief Complaint  Patient presents with  . Right Foot - Pain, Routine Post Op      HPI: This is a pleasant gentleman who is now 2 weeks status post right talonavicular and fusion and Dynaflex wrapping he has no complaints and is doing well  Assessment & Plan: Visit Diagnoses: No diagnosis found.  Plan: There has been an improvement in his swelling.  He will go back into wraps hopefully we can remove sutures next week x-ray should also be obtained at that visit  Follow-Up Instructions: No follow-ups on file.   Ortho Exam  Patient is alert, oriented, no adenopathy, well-dressed, normal affect, normal respiratory effort. Right foot: He has had some minor wound dehiscence across the distal end of the wound there is no associated cellulitis no drainage no foul odor proximal end of the wound is healed pulses are palpable Right leg compartments are soft. Decreased swelling. No ulceration. Still significant swelling Imaging: No results found. No images are attached to the encounter.  Labs: Lab Results  Component Value Date   HGBA1C 6.3 11/21/2015   HGBA1C 6.2 06/12/2015   REPTSTATUS 12/24/2017 FINAL 12/23/2017   CULT  12/23/2017    NO GROWTH Performed at [facility], [place] [address], [place], [place] [address]    LABORGA ESCHERICHIA COLI 12/22/2017     Lab Results  Component Value Date   ALBUMIN 4.1 01/17/2019   ALBUMIN 2.9 (L) 12/26/2017   ALBUMIN 3.0 (L) 12/25/2017    Lab Results  Component Value Date   MG 1.9 12/25/2017   No results found for: VD25OH  No results found for: PREALBUMIN CBC EXTENDED Latest Ref Rng & Units 08/24/2019 12/26/2017 12/25/2017  WBC 4.0 - 10.5 K/uL 4.7 4.7 4.8  RBC 4.22 -  5.81 MIL/uL 4.36 4.22 4.47  HGB 13.0 - 17.0 g/dL 14.1 13.6 14.3  HCT 39.0 - 52.0 % 43.7 40.8 43.3  PLT 150 - 400 K/uL 169 118(L) 107(L)  NEUTROABS 1.7 - 7.7 K/uL - - -  LYMPHSABS 0.7 - 4.0 K/uL - - -     There is no height or weight on file to calculate BMI.  Orders:  No orders of the defined types were placed in this encounter.  No orders of the defined types were placed in this encounter.    Procedures: No procedures performed  Clinical Data: No additional findings.  ROS:  All other systems negative, except as noted in the HPI. Review of Systems  Objective: Vital Signs: There were no vitals taken for this visit.  Specialty Comments:  No specialty comments available.  PMFS History: Patient Active Problem List   Diagnosis Date Noted  . Primary osteoarthritis, right ankle and foot   . Lower extremity edema 08/10/2019  . Acute DVT (deep venous thrombosis) ([place]) 07/19/2019  . History of pulmonary embolism 07/19/2019  . Parkinson's disease ([place]) 02/03/2018  . Community acquired pneumonia of left lower lobe of lung 12/23/2017  . Sepsis ([place]) 12/23/2017  . Morbid obesity due to excess calories ([place]) 08/23/2016  . OSA on CPAP 08/23/2016  .  Dyspnea on exertion 08/22/2016  . Umbilical hernia [id number]  . Persistent atrial fibrillation ([place])   . Encounter for therapeutic drug monitoring 08/12/2013  . Long term current use of anticoagulant 07/30/2010  . COLONIC POLYPS 06/03/2010  . Factor V Leiden ([place]) 06/03/2010  . GERD 06/03/2010  . HIATAL HERNIA 06/03/2010  . BENIGN PROSTATIC HYPERTROPHY, WITH OBSTRUCTION 06/03/2010  . PSORIASIS 06/03/2010   Past Medical History:  Diagnosis Date  . Arthritis   . BENIGN PROSTATIC HYPERTROPHY, WITH OBSTRUCTION   . Cancer (HCC)    skin, basal, squamous  . COLONIC POLYPS   . Diverticulitis   . DVT (deep venous thrombosis) ([place]) 2000  . Dysrhythmia    Hx Afib- 2017  . Early cataract   . Factor 5 Leiden mutation, heterozygous  ([place])   . GERD (gastroesophageal reflux disease)    not on medication  . Hearing aid worn    B/L  . HIATAL HERNIA   . ITP (idiopathic thrombocytopenic purpura) 2003  . Long term current use of anticoagulant   . Osteoarthritis    right foot  . Parkinson's disease ([place]) 02/03/2018  . PSORIASIS   . Pulmonary embolus ([place]) 2000  . Shortness of breath dyspnea    at times - Talking alot  . Sleep apnea   . Wears glasses     Family History  Problem Relation Age of Onset  . Cancer Father   . Breast cancer Sister   . Heart disease Brother   . Cancer Maternal Grandmother   . Stroke Paternal Grandfather     Past Surgical History:  Procedure Laterality Date  . ANKLE FUSION Right 08/24/2019   Procedure: RIGHT TALO-NAVICULAR FUSION;  Surgeon: Newt Minion, MD;  Location: [facility];  Service: Orthopedics;  Laterality: Right;  . CARDIOVERSION N/A 11/22/2015   Procedure: CARDIOVERSION;  Surgeon: Sanda Klein, MD;  [facility];  Service: Cardiovascular;  Laterality: N/A;  . COLONOSCOPY    . HERNIA REPAIR Left    Inguinal- x2 . mesh x1  . INSERTION OF MESH N/A 02/25/2016   Procedure: INSERTION OF MESH;  Surgeon: Rolm Bookbinder, MD;  Location: [place];  Service: General;  Laterality: N/A;  . LUNG REMOVAL, PARTIAL Right 2004   was fungal and not cancerous  . PROSTATE SURGERY    . UMBILICAL HERNIA REPAIR N/A 02/25/2016   Procedure: LAPAROSCOPIC UMBILICAL HERNIA REPAIR;  Surgeon: Rolm Bookbinder, MD;  Location: [facility];  Service: General;  Laterality: N/A;   Social History   Occupational History    Employer: RETIRED    Comment: Engineer, structural  Tobacco Use  . Smoking status: Former Smoker    Packs/day: 1.00    Quit date: 06/16/1985    Years since quitting: 34.2  . Smokeless tobacco: Never Used  . Tobacco comment: quit approx age 53-   Substance and Sexual Activity  . Alcohol use: No  . Drug use: No  . Sexual activity: Yes

## 2019-09-13 LAB — BASIC METABOLIC PANEL
BUN/Creatinine Ratio: 20 (ref 10–24)
BUN: 24 mg/dL (ref 8–27)
CO2: 25 mmol/L (ref 20–29)
Calcium: 9.5 mg/dL (ref 8.6–10.2)
Chloride: 104 mmol/L (ref 96–106)
Creatinine, Ser: 1.22 mg/dL (ref 0.76–1.27)
GFR calc Af Amer: 67 mL/min/{1.73_m2} (ref >59)
GFR calc non Af Amer: 58 mL/min/{1.73_m2} — ABNORMAL LOW (ref >59)
Glucose: 95 mg/dL (ref 65–99)
Potassium: 4.9 mmol/L (ref 3.5–5.2)
Sodium: 143 mmol/L (ref 134–144)

## 2019-09-15 ENCOUNTER — Encounter: Payer: Self-pay | Admitting: Cardiology

## 2019-09-15 ENCOUNTER — Telehealth (INDEPENDENT_AMBULATORY_CARE_PROVIDER_SITE_OTHER): Payer: PPO | Admitting: Cardiology

## 2019-09-15 ENCOUNTER — Other Ambulatory Visit: Payer: Self-pay | Admitting: Neurology

## 2019-09-15 VITALS — BP 163/99 | HR 76 | Ht 72.0 in | Wt 258.4 lb

## 2019-09-15 DIAGNOSIS — I4819 Other persistent atrial fibrillation: Secondary | ICD-10-CM | POA: Diagnosis not present

## 2019-09-15 DIAGNOSIS — I82411 Acute embolism and thrombosis of right femoral vein: Secondary | ICD-10-CM

## 2019-09-15 DIAGNOSIS — Z86711 Personal history of pulmonary embolism: Secondary | ICD-10-CM

## 2019-09-15 DIAGNOSIS — G20 Parkinson's disease: Secondary | ICD-10-CM

## 2019-09-15 DIAGNOSIS — Z7901 Long term (current) use of anticoagulants: Secondary | ICD-10-CM

## 2019-09-15 DIAGNOSIS — D6851 Activated protein C resistance: Secondary | ICD-10-CM

## 2019-09-15 NOTE — Patient Instructions (Signed)
Medication Instructions:  Your physician recommends that you continue on your current medications as directed. Please refer to the Current Medication list given to you today.  *If you need a refill on your cardiac medications before your next appointment, please call your pharmacy*   Follow-Up: At [facility], you and your health needs are our priority.  As part of our continuing mission to provide you with exceptional heart care, we have created designated Provider Care Teams.  These Care Teams include your primary Cardiologist (physician) and Advanced Practice Providers (APPs -  Physician Assistants and Nurse Practitioners) who all work together to provide you with the care you need, when you need it.  We recommend signing up for the patient portal called "MyChart".  Sign up information is provided on this After Visit Summary.  MyChart is used to connect with patients for Virtual Visits (Telemedicine).  Patients are able to view lab/test results, encounter notes, upcoming appointments, etc.  Non-urgent messages can be sent to your provider as well.   To learn more about what you can do with MyChart, go to [email or web address].com.    Your next appointment:   6 month(s)  The format for your next appointment:   In Person  Provider:   You may see Brian Crenshaw, MD or one of the following Advanced Practice Providers on your designated Care Team:    Luke Kilroy, PA-C  Callie Goodrich, PA-C  Jesse Cleaver, FNP    Other Instructions Please call our office 2 months in advance to schedule your follow-up appointment with Dr. Crenshaw.  

## 2019-09-15 NOTE — Progress Notes (Signed)
Virtual Visit via Telephone Note   This visit type was conducted due to national recommendations for restrictions regarding the COVID-19 Pandemic (e.g. social distancing) in an effort to limit this patient's exposure and mitigate transmission in our community.  Due to his co-morbid illnesses, this patient is at least at moderate risk for complications without adequate follow up.  This format is felt to be most appropriate for this patient at this time.  The patient did not have access to video technology/had technical difficulties with video requiring transitioning to audio format only (telephone).  All issues noted in this document were discussed and addressed.  No physical exam could be performed with this format.  Please refer to the patient's chart for his  consent to telehealth for [facility].   The patient was identified using 2 identifiers.  Date:  09/15/2019   ID:  Stanley Wright, DOB Sep 13, 1945, MRN [id number]  Patient Location: Home Provider Location: Home  PCP:  Scifres, Earlie Server, PA-C  Cardiologist:  Kirk Ruths, MD  Electrophysiologist:  None   Evaluation Performed:  Follow-Up Visit  Chief Complaint:  none  History of Present Illness:    Stanley Wright is a 74 y.o. male with a hx of factor V Leiden. He has had 2 prior pulmonary emboli. He is on chronic Coumadin therapy. He also has chronic atrial fibrillation and Parkinson's. He did have a nuclear scan in February 2018 that showed no ischemia or infarction.  Echo in Sept 2019 showed an EF of 55-60%, mild LAE, PA pressure 31 mmHg. He has a history of lower extremity edema noted in the chart. In Feb 2021 his INR became subtherapeutic after the patient missed a few doses. He was contacted virtually 07/19/2019 with complaints of new RLE edema.  LE venous dopplers done the next day showed extensive acute DVT in his RLE.  He was seen in the office by Dr Stanford Breed 07/20/2019.  His INR was by then therapeutic and the patient  exhibited no signs of PE and no new Rx was recommended.    The patient had Rt foot surgery 08/24/2019 by Dr Sharol Given.  It was suggested that if Coumadin needed to be held that the patient would need Lovenox crossover.  The patient ended having the surgery on Coumadin so this wasn't neccessary.  He was contacted today for routine follow up.  He is recovering from his foot surgery, still pretty uncomfortable. His INR has been therapeutic. He denies any chest pain or unusual dyspnea.   The patient does not have symptoms concerning for COVID-19 infection (fever, chills, cough, or new shortness of breath).    Past Medical History:  Diagnosis Date  . Arthritis   . BENIGN PROSTATIC HYPERTROPHY, WITH OBSTRUCTION   . Cancer (HCC)    skin, basal, squamous  . COLONIC POLYPS   . Diverticulitis   . DVT (deep venous thrombosis) ([place]) 2000  . Dysrhythmia    Hx Afib- 2017  . Early cataract   . Factor 5 Leiden mutation, heterozygous ([place])   . GERD (gastroesophageal reflux disease)    not on medication  . Hearing aid worn    B/L  . HIATAL HERNIA   . ITP (idiopathic thrombocytopenic purpura) 2003  . Long term current use of anticoagulant   . Osteoarthritis    right foot  . Parkinson's disease ([place]) 02/03/2018  . PSORIASIS   . Pulmonary embolus ([place]) 2000  . Shortness of breath dyspnea    at times - Talking alot  .  Sleep apnea   . Wears glasses    Past Surgical History:  Procedure Laterality Date  . ANKLE FUSION Right 08/24/2019   Procedure: RIGHT TALO-NAVICULAR FUSION;  Surgeon: Newt Minion, MD;  Location: [facility];  Service: Orthopedics;  Laterality: Right;  . CARDIOVERSION N/A 11/22/2015   Procedure: CARDIOVERSION;  Surgeon: Sanda Klein, MD;  [facility];  Service: Cardiovascular;  Laterality: N/A;  . COLONOSCOPY    . HERNIA REPAIR Left    Inguinal- x2 . mesh x1  . INSERTION OF MESH N/A 02/25/2016   Procedure: INSERTION OF MESH;  Surgeon: Rolm Bookbinder, MD;  Location: [facility];   Service: General;  Laterality: N/A;  . LUNG REMOVAL, PARTIAL Right 2004   was fungal and not cancerous  . PROSTATE SURGERY    . UMBILICAL HERNIA REPAIR N/A 02/25/2016   Procedure: LAPAROSCOPIC UMBILICAL HERNIA REPAIR;  Surgeon: Rolm Bookbinder, MD;  Location: [facility];  Service: General;  Laterality: N/A;     Current Meds  Medication Sig  . carbidopa-levodopa (SINEMET CR) 50-200 MG tablet TAKE 1 TABLET BY MOUTH AT BEDTIME  . carbidopa-levodopa (SINEMET IR) 25-100 MG tablet 1 tablet 4-5 times per day (Patient taking differently: Take 1 tablet by mouth 5 (five) times daily. )  . CARTIA XT 300 MG 24 hr capsule Take 1 capsule by mouth once daily  . enoxaparin (LOVENOX) 120 MG/0.8ML injection Inject 0.8 mLs (120 mg total) into the skin every 12 (twelve) hours.  . furosemide (LASIX) 40 MG tablet Take 2 tablets (80 mg) on Monday, Wednesday, Friday; 1 tablet (40 mg) all other days. (Patient taking differently: Take 40-80 mg by mouth See admin instructions. Take 80 mg on Monday, Wednesday, Friday and 40 mg all other days.)  . oxyCODONE (OXY IR/ROXICODONE) 5 MG immediate release tablet Take 1 tablet (5 mg total) by mouth every 4 (four) hours as needed for severe pain.  Marland Kitchen warfarin (COUMADIN) 5 MG tablet TAKE 1 TABLET BY MOUTH ONCE DAILY OR  AS  DIRECTED  BY  COUMADIN  CLINIC (Patient taking differently: Take 5 mg by mouth daily. )     Allergies:   Tylenol [acetaminophen]   Social History   Tobacco Use  . Smoking status: Former Smoker    Packs/day: 1.00    Quit date: 06/16/1985    Years since quitting: 34.2  . Smokeless tobacco: Never Used  . Tobacco comment: quit approx age 89-   Substance Use Topics  . Alcohol use: No  . Drug use: No     Family Hx: The patient's family history includes Breast cancer in his sister; Cancer in his father and maternal grandmother; Heart disease in his brother; Stroke in his paternal grandfather.  ROS:   Please see the history of present illness.    All other  systems reviewed and are negative.   Prior CV studies:   The following studies were reviewed today:    Labs/Other Tests and Data Reviewed:    EKG:  An ECG dated 01/19/2019 was personally reviewed today and demonstrated:  AF with CVR  Recent Labs: 01/17/2019: ALT 6 08/24/2019: Hemoglobin 14.1; Platelets 169 09/12/2019: BUN 24; Creatinine, Ser 1.22; Potassium 4.9; Sodium 143   Recent Lipid Panel Lab Results  Component Value Date/Time   CHOL 205 (H) 06/12/2015 03:05 PM   TRIG 274 (H) 06/12/2015 03:05 PM   HDL 46 06/12/2015 03:05 PM   CHOLHDL 4.5 06/12/2015 03:05 PM   LDLCALC 104 06/12/2015 03:05 PM    Wt Readings  from Last 3 Encounters:  09/15/19 258 lb 6.4 oz (117.2 kg)  09/05/19 255 lb (115.7 kg)  08/24/19 255 lb (115.7 kg)     Objective:    Vital Signs:  BP (!) 163/99 Comment: right arm  Pulse 76   Ht 6' (1.829 m)   Wt 258 lb 6.4 oz (117.2 kg)   SpO2 97%   BMI 35.05 kg/m    VITAL SIGNS:  reviewed  ASSESSMENT & PLAN:    S/P Rt foot surgery-09/13/2019 Coumadin was not held for this  Acute DVT (deep venous thrombosis) (HCC) Acute Rt LE DVT 07/19/2019 while the patient's INR was sub therapeutic (compliance)  Long term current use of anticoagulant Coumadin Rx- INR 1.5 today ! Pharmacy following  Factor V Leiden ([place]) .  History of pulmonary embolism 2008  Persistent atrial fibrillation (HCC) Rate controlled on Diltiazem 300 mg  Parkinson's disease ([place]  Plan:  F/U 6 months-in office  COVID-19 Education: The signs and symptoms of COVID-19 were discussed with the patient and how to seek care for testing (follow up with PCP or arrange E-visit).  The importance of social distancing was discussed today.  Time:   Today, I have spent 10 minutes with the patient with telehealth technology discussing the above problems.     Medication Adjustments/Labs and Tests Ordered: Current medicines are reviewed at length with the patient today.  Concerns regarding  medicines are outlined above.   Tests Ordered: No orders of the defined types were placed in this encounter.   Medication Changes: No orders of the defined types were placed in this encounter.   Follow Up:  In Person Dr Stanford Breed or myself in 6 months.  Signed, Kerin Ransom, PA-C  09/15/2019 11:01 AM    [place]

## 2019-09-20 ENCOUNTER — Ambulatory Visit: Payer: PPO | Admitting: Physician Assistant

## 2019-09-22 ENCOUNTER — Other Ambulatory Visit: Payer: Self-pay

## 2019-09-22 ENCOUNTER — Encounter: Payer: Self-pay | Admitting: Physician Assistant

## 2019-09-22 ENCOUNTER — Ambulatory Visit (INDEPENDENT_AMBULATORY_CARE_PROVIDER_SITE_OTHER): Payer: PPO

## 2019-09-22 ENCOUNTER — Ambulatory Visit (INDEPENDENT_AMBULATORY_CARE_PROVIDER_SITE_OTHER): Payer: PPO | Admitting: Physician Assistant

## 2019-09-22 ENCOUNTER — Ambulatory Visit (INDEPENDENT_AMBULATORY_CARE_PROVIDER_SITE_OTHER): Payer: PPO | Admitting: *Deleted

## 2019-09-22 VITALS — Ht 72.0 in | Wt 258.0 lb

## 2019-09-22 DIAGNOSIS — M19071 Primary osteoarthritis, right ankle and foot: Secondary | ICD-10-CM

## 2019-09-22 DIAGNOSIS — I4819 Other persistent atrial fibrillation: Secondary | ICD-10-CM

## 2019-09-22 DIAGNOSIS — Z5181 Encounter for therapeutic drug level monitoring: Secondary | ICD-10-CM | POA: Diagnosis not present

## 2019-09-22 LAB — POCT INR: INR: 1.4 — AB (ref 2.0–3.0)

## 2019-09-22 NOTE — Progress Notes (Signed)
Office Visit Note   Patient: Stanley Wright           Date of Birth: [birth year]           MRN: [id number] Visit Date: 09/22/2019              Requested by: Maude Leriche, PA-C [place],  [place] [address] PCP: Maude Leriche, PA-C  Chief Complaint  Patient presents with  . Right Foot - Routine Post Op    08/24/19 right talonavicular fusion       HPI: Patient presents today 4 weeks status post right talonavicular arthrodesis.  He has been nonweightbearing with just a small amount of weight on his heel as needed.  He has been wrapped in Profore dressings due to significant swelling.  Assessment & Plan: Visit Diagnoses:  1. Primary osteoarthritis, right ankle and foot     Plan: Sutures will be removed today and he will go back into Profore dressing.  He should continue to remain minimal weightbearing in his postop shoe  Follow-Up Instructions: No follow-ups on file.   Ortho Exam  Patient is alert, oriented, no adenopathy, well-dressed, normal affect, normal respiratory effort. Right lower extremity significant decrease in swelling in his calf and lower leg.  Compartments are soft without any ulcers.  He does have a moderate plus amount of soft tissue swelling in the foot but no cellulitis incision is healing well with healthy wound edges  Imaging: No results found. No images are attached to the encounter.  Labs: Lab Results  Component Value Date   HGBA1C 6.3 11/21/2015   HGBA1C 6.2 06/12/2015   REPTSTATUS 12/24/2017 FINAL 12/23/2017   CULT  12/23/2017    NO GROWTH Performed at [facility], [place] [address] [place], [place] [address]    LABORGA ESCHERICHIA COLI 12/22/2017     Lab Results  Component Value Date   ALBUMIN 4.1 01/17/2019   ALBUMIN 2.9 (L) 12/26/2017   ALBUMIN 3.0 (L) 12/25/2017    Lab Results  Component Value Date   MG 1.9 12/25/2017   No results found for: VD25OH  No results found for: PREALBUMIN CBC  EXTENDED Latest Ref Rng & Units 08/24/2019 12/26/2017 12/25/2017  WBC 4.0 - 10.5 K/uL 4.7 4.7 4.8  RBC 4.22 - 5.81 MIL/uL 4.36 4.22 4.47  HGB 13.0 - 17.0 g/dL 14.1 13.6 14.3  HCT 39.0 - 52.0 % 43.7 40.8 43.3  PLT 150 - 400 K/uL 169 118(L) 107(L)  NEUTROABS 1.7 - 7.7 K/uL - - -  LYMPHSABS 0.7 - 4.0 K/uL - - -     Body mass index is 34.99 kg/m.  Orders:  Orders Placed This Encounter  Procedures  . XR Foot Complete Right   No orders of the defined types were placed in this encounter.    Procedures: No procedures performed  Clinical Data: No additional findings.  ROS:  All other systems negative, except as noted in the HPI. Review of Systems  Objective: Vital Signs: Ht 6' (1.829 m)   Wt 258 lb (117 kg)   BMI 34.99 kg/m   Specialty Comments:  No specialty comments available.  PMFS History: Patient Active Problem List   Diagnosis Date Noted  . Primary osteoarthritis, right ankle and foot   . Lower extremity edema 08/10/2019  . Acute DVT (deep venous thrombosis) ([place]) 07/19/2019  . History of pulmonary embolism 07/19/2019  . Parkinson's disease ([place]) 02/03/2018  . Community acquired pneumonia of left lower  lobe of lung 12/23/2017  . Sepsis ([place]) 12/23/2017  . Morbid obesity due to excess calories ([place]) 08/23/2016  . OSA on CPAP 08/23/2016  . Dyspnea on exertion 08/22/2016  . Umbilical hernia [id number]  . Persistent atrial fibrillation ([place])   . Encounter for therapeutic drug monitoring 08/12/2013  . Long term current use of anticoagulant 07/30/2010  . COLONIC POLYPS 06/03/2010  . Factor V Leiden ([place]) 06/03/2010  . GERD 06/03/2010  . HIATAL HERNIA 06/03/2010  . BENIGN PROSTATIC HYPERTROPHY, WITH OBSTRUCTION 06/03/2010  . PSORIASIS 06/03/2010   Past Medical History:  Diagnosis Date  . Arthritis   . BENIGN PROSTATIC HYPERTROPHY, WITH OBSTRUCTION   . Cancer (HCC)    skin, basal, squamous  . COLONIC POLYPS   . Diverticulitis   . DVT (deep venous  thrombosis) ([place]) 2000  . Dysrhythmia    Hx Afib- 2017  . Early cataract   . Factor 5 Leiden mutation, heterozygous ([place])   . GERD (gastroesophageal reflux disease)    not on medication  . Hearing aid worn    B/L  . HIATAL HERNIA   . ITP (idiopathic thrombocytopenic purpura) 2003  . Long term current use of anticoagulant   . Osteoarthritis    right foot  . Parkinson's disease ([place]) 02/03/2018  . PSORIASIS   . Pulmonary embolus ([place]) 2000  . Shortness of breath dyspnea    at times - Talking alot  . Sleep apnea   . Wears glasses     Family History  Problem Relation Age of Onset  . Cancer Father   . Breast cancer Sister   . Heart disease Brother   . Cancer Maternal Grandmother   . Stroke Paternal Grandfather     Past Surgical History:  Procedure Laterality Date  . ANKLE FUSION Right 08/24/2019   Procedure: RIGHT TALO-NAVICULAR FUSION;  Surgeon: Newt Minion, MD;  Location: [facility];  Service: Orthopedics;  Laterality: Right;  . CARDIOVERSION N/A 11/22/2015   Procedure: CARDIOVERSION;  Surgeon: Sanda Klein, MD;  [facility];  Service: Cardiovascular;  Laterality: N/A;  . COLONOSCOPY    . HERNIA REPAIR Left    Inguinal- x2 . mesh x1  . INSERTION OF MESH N/A 02/25/2016   Procedure: INSERTION OF MESH;  Surgeon: Rolm Bookbinder, MD;  Location: [facility];  Service: General;  Laterality: N/A;  . LUNG REMOVAL, PARTIAL Right 2004   was fungal and not cancerous  . PROSTATE SURGERY    . UMBILICAL HERNIA REPAIR N/A 02/25/2016   Procedure: LAPAROSCOPIC UMBILICAL HERNIA REPAIR;  Surgeon: Rolm Bookbinder, MD;  Location: [facility];  Service: General;  Laterality: N/A;   Social History   Occupational History    Employer: RETIRED    Comment: Engineer, structural  Tobacco Use  . Smoking status: Former Smoker    Packs/day: 1.00    Quit date: 06/16/1985    Years since quitting: 34.2  . Smokeless tobacco: Never Used  . Tobacco comment: quit approx age 9-   Substance and Sexual Activity    . Alcohol use: No  . Drug use: No  . Sexual activity: Yes

## 2019-09-22 NOTE — Patient Instructions (Signed)
Description   Take 1.5 tablets today and 2 tablets tomorrow, then continue taking 1 tablet daily except 1.5 tablets on Fridays. Recheck INR in 2 weeks. Call with any questions if gets any new medications or scheduled for any procedures  [phone]

## 2019-09-27 NOTE — Progress Notes (Deleted)
Assessment/Plan:   1. . 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 Explained to patient that this is not a diagnostic or therapeutic scan. Explained that one can lose dopamine many years prior to meeting clinical criteria for a specific diagnosis. I had previously taken him off of his levodopa for over 36 hours and he did not look particularly parkinsonian. 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 Given this in combination with restless leg,MSA is certainly in the differential. He and I discussed this today. We also discussed that we would need time to see if this is correct.  May do on/off test in the future. -For now, he will continue carbidopa/levodopa 25/100, 1 tablet 4 times per day. -He will continue carbidopa/levodopa 50/200 at bedtime for restless leg.  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 He and I discussed the value of Botox. Wants to hold on that and states that his chiropractor is working on that.  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  4.dysphagia -mild. Doesn't want to do MBE at this time  5.  Atrial fibrillation  -Following with cardiology.  On diltiazem.  6.  Factor V Leiden  -On Coumadin.   Subjective:   Stanley Wright was seen today in follow up for Parkinsons disease.  My previous records were reviewed prior to todays visit as well as outside records available to me.  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  He did not find it helpful and we ultimately decided to discontinue the  Botox.  Pt denies falls.  Pt denies lightheadedness, near syncope.  No hallucinations.  Mood has been good.  Patient just had surgery on March 10 for osteoarthritis of the right foot.  He had talonavicular fusion.  Current prescribed movement disorder medications: ***Carbidopa/levodopa 25/100, 1 tablet 4 times per day Carbidopa/levodopa to 50/200 at bedtime   PREVIOUS MEDICATIONS: {Parkinson's RX:18200}  ALLERGIES:   Allergies  Allergen Reactions  . Tylenol [Acetaminophen] Other (See Comments)    Pt states he had a DNA test completed that stated he should never take tylenol.    CURRENT MEDICATIONS:  Outpatient Encounter Medications as of 09/30/2019  Medication Sig  . carbidopa-levodopa (SINEMET CR) 50-200 MG tablet TAKE 1 TABLET BY MOUTH AT BEDTIME  . carbidopa-levodopa (SINEMET IR) 25-100 MG tablet 1 tablet 4-5 times per day (Patient taking differently: Take 1 tablet by mouth 5 (five) times daily. )  . CARTIA XT 300 MG 24 hr capsule Take 1 capsule by mouth once daily  . enoxaparin (LOVENOX) 120 MG/0.8ML injection Inject 0.8 mLs (120 mg total) into the skin every 12 (twelve) hours.  . furosemide (LASIX) 40 MG tablet Take 2 tablets (80 mg) on Monday, Wednesday, Friday; 1 tablet (40 mg) all other days. (Patient taking differently: Take 40-80 mg by mouth See admin instructions. Take 80 mg on Monday, Wednesday, Friday and 40 mg all other days.)  . oxyCODONE (OXY IR/ROXICODONE) 5 MG immediate release tablet Take 1 tablet (5 mg total) by mouth every 4 (four) hours as needed for severe pain.  Marland Kitchen warfarin (  COUMADIN) 5 MG tablet TAKE 1 TABLET BY MOUTH ONCE DAILY OR  AS  DIRECTED  BY  COUMADIN  CLINIC (Patient taking differently: Take 5 mg by mouth daily. )   No facility-administered encounter medications on file as of 09/30/2019.    Objective:   PHYSICAL EXAMINATION:    VITALS:  There were no vitals filed for this visit.  GEN:  The patient appears stated age and is in NAD. HEENT:   Normocephalic, atraumatic.  The mucous membranes are moist. The superficial temporal arteries are without ropiness or tenderness. CV:  RRR Lungs:  CTAB Neck/HEME:  There are no carotid bruits bilaterally.  Neurological examination:  Orientation: The patient is alert and oriented x3. Cranial nerves: There is good facial symmetry with*** facial hypomimia. The speech is fluent and clear. Soft palate rises symmetrically and there is no tongue deviation. Hearing is intact to conversational tone. Sensation: Sensation is intact to light touch throughout Motor: Strength is at least antigravity x4.  Movement examination: Tone: There is ***tone in the *** Abnormal movements: *** Coordination:  There is *** decremation with RAM's, *** Gait and Station: The patient has *** difficulty arising out of a deep-seated chair without the use of the hands. The patient's stride length is ***.  The patient has a *** pull test.      Total time spent on today's visit was ***30 minutes, including both face-to-face time and nonface-to-face time.  Time included that spent on review of records (prior notes available to me/labs/imaging if pertinent), discussing treatment and goals, answering patient's questions and coordinating care.  Cc:  Scifres, Dorothy, [employer]

## 2019-09-29 ENCOUNTER — Encounter: Payer: Self-pay | Admitting: Orthopedic Surgery

## 2019-09-29 ENCOUNTER — Other Ambulatory Visit: Payer: Self-pay

## 2019-09-29 ENCOUNTER — Ambulatory Visit (INDEPENDENT_AMBULATORY_CARE_PROVIDER_SITE_OTHER): Payer: PPO | Admitting: Orthopedic Surgery

## 2019-09-29 DIAGNOSIS — M19071 Primary osteoarthritis, right ankle and foot: Secondary | ICD-10-CM

## 2019-09-29 NOTE — Progress Notes (Signed)
Office Visit Note   Patient: Stanley Wright           Date of Birth: [birth year]           MRN: [id number] Visit Date: 09/29/2019              Requested by: Maude Leriche, PA-C [place],  [place] [address] PCP: Maude Leriche, PA-C  Chief Complaint  Patient presents with  . Right Foot - Follow-up      HPI: Patient is a 74 year old gentleman who is 5 weeks status post right talonavicular fusion.  Patient has been having problems with venous insufficiency swelling and has undergone serial compression wraps.  Patient states that at this time the swelling in the right leg is equal to the swelling in the left leg and he is currently wearing a compression sock on the left.  Assessment & Plan: Visit Diagnoses:  1. Primary osteoarthritis, right ankle and foot     Plan: We will advance patient to a compression sock on the right continue with the postoperative shoe three-view radiographs of the left foot at follow-up.  Follow-Up Instructions: Return in about 3 weeks (around 10/20/2019).   Ortho Exam  Patient is alert, oriented, no adenopathy, well-dressed, normal affect, normal respiratory effort. Examination the incision is well-healed the venous swelling has decreased there is no open ulcers no cellulitis no signs of infection.  Imaging: No results found. No images are attached to the encounter.  Labs: Lab Results  Component Value Date   HGBA1C 6.3 11/21/2015   HGBA1C 6.2 06/12/2015   REPTSTATUS 12/24/2017 FINAL 12/23/2017   CULT  12/23/2017    NO GROWTH Performed at [facility], [place] [address], [place], [place] [address]    LABORGA ESCHERICHIA COLI 12/22/2017     Lab Results  Component Value Date   ALBUMIN 4.1 01/17/2019   ALBUMIN 2.9 (L) 12/26/2017   ALBUMIN 3.0 (L) 12/25/2017    Lab Results  Component Value Date   MG 1.9 12/25/2017   No results found for: VD25OH  No results found for: PREALBUMIN CBC EXTENDED Latest Ref Rng  & Units 08/24/2019 12/26/2017 12/25/2017  WBC 4.0 - 10.5 K/uL 4.7 4.7 4.8  RBC 4.22 - 5.81 MIL/uL 4.36 4.22 4.47  HGB 13.0 - 17.0 g/dL 14.1 13.6 14.3  HCT 39.0 - 52.0 % 43.7 40.8 43.3  PLT 150 - 400 K/uL 169 118(L) 107(L)  NEUTROABS 1.7 - 7.7 K/uL - - -  LYMPHSABS 0.7 - 4.0 K/uL - - -     There is no height or weight on file to calculate BMI.  Orders:  No orders of the defined types were placed in this encounter.  No orders of the defined types were placed in this encounter.    Procedures: No procedures performed  Clinical Data: No additional findings.  ROS:  All other systems negative, except as noted in the HPI. Review of Systems  Objective: Vital Signs: There were no vitals taken for this visit.  Specialty Comments:  No specialty comments available.  PMFS History: Patient Active Problem List   Diagnosis Date Noted  . Primary osteoarthritis, right ankle and foot   . Lower extremity edema 08/10/2019  . Acute DVT (deep venous thrombosis) ([place]) 07/19/2019  . History of pulmonary embolism 07/19/2019  . Parkinson's disease ([place]) 02/03/2018  . Community acquired pneumonia of left lower lobe of lung 12/23/2017  . Sepsis ([place]) 12/23/2017  . Morbid obesity due to  excess calories ([place]) 08/23/2016  . OSA on CPAP 08/23/2016  . Dyspnea on exertion 08/22/2016  . Umbilical hernia [id number]  . Persistent atrial fibrillation ([place])   . Encounter for therapeutic drug monitoring 08/12/2013  . Long term current use of anticoagulant 07/30/2010  . COLONIC POLYPS 06/03/2010  . Factor V Leiden ([place]) 06/03/2010  . GERD 06/03/2010  . HIATAL HERNIA 06/03/2010  . BENIGN PROSTATIC HYPERTROPHY, WITH OBSTRUCTION 06/03/2010  . PSORIASIS 06/03/2010   Past Medical History:  Diagnosis Date  . Arthritis   . BENIGN PROSTATIC HYPERTROPHY, WITH OBSTRUCTION   . Cancer (HCC)    skin, basal, squamous  . COLONIC POLYPS   . Diverticulitis   . DVT (deep venous thrombosis) ([place]) 2000  .  Dysrhythmia    Hx Afib- 2017  . Early cataract   . Factor 5 Leiden mutation, heterozygous ([place])   . GERD (gastroesophageal reflux disease)    not on medication  . Hearing aid worn    B/L  . HIATAL HERNIA   . ITP (idiopathic thrombocytopenic purpura) 2003  . Long term current use of anticoagulant   . Osteoarthritis    right foot  . Parkinson's disease ([place]) 02/03/2018  . PSORIASIS   . Pulmonary embolus ([place]) 2000  . Shortness of breath dyspnea    at times - Talking alot  . Sleep apnea   . Wears glasses     Family History  Problem Relation Age of Onset  . Cancer Father   . Breast cancer Sister   . Heart disease Brother   . Cancer Maternal Grandmother   . Stroke Paternal Grandfather     Past Surgical History:  Procedure Laterality Date  . ANKLE FUSION Right 08/24/2019   Procedure: RIGHT TALO-NAVICULAR FUSION;  Surgeon: Newt Minion, MD;  Location: [facility];  Service: Orthopedics;  Laterality: Right;  . CARDIOVERSION N/A 11/22/2015   Procedure: CARDIOVERSION;  Surgeon: Sanda Klein, MD;  [facility];  Service: Cardiovascular;  Laterality: N/A;  . COLONOSCOPY    . HERNIA REPAIR Left    Inguinal- x2 . mesh x1  . INSERTION OF MESH N/A 02/25/2016   Procedure: INSERTION OF MESH;  Surgeon: Rolm Bookbinder, MD;  Location: [facility];  Service: General;  Laterality: N/A;  . LUNG REMOVAL, PARTIAL Right 2004   was fungal and not cancerous  . PROSTATE SURGERY    . UMBILICAL HERNIA REPAIR N/A 02/25/2016   Procedure: LAPAROSCOPIC UMBILICAL HERNIA REPAIR;  Surgeon: Rolm Bookbinder, MD;  Location: [facility];  Service: General;  Laterality: N/A;   Social History   Occupational History    Employer: RETIRED    Comment: Engineer, structural  Tobacco Use  . Smoking status: Former Smoker    Packs/day: 1.00    Quit date: 06/16/1985    Years since quitting: 34.3  . Smokeless tobacco: Never Used  . Tobacco comment: quit approx age 42-   Substance and Sexual Activity  . Alcohol use: No  .  Drug use: No  . Sexual activity: Yes

## 2019-09-30 ENCOUNTER — Ambulatory Visit: Payer: PPO | Admitting: Neurology

## 2019-09-30 NOTE — Progress Notes (Signed)
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 Explained to patient that this is not a diagnostic or therapeutic scan. Explained that one can lose dopamine many years prior to meeting clinical criteria for a specific diagnosis. I had previously taken him off of his levodopa for over 36 hours and he did not look particularly parkinsonian. 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 Given this in combination with restless leg,MSA is certainly in the differential. He and I discussed have discussed this. We also discussed that we would need time to see if this is correct.  May do on/off test in the future but can't do right now because of recent foot surgery.  Pt does think that helps.  States that "it keeps anxiety down and helps toe dystonia." -For now, he will continue carbidopa/levodopa 25/100, 1 tablet 5 times per day  (takes that 5th dose in the middle of the night) -He will continue carbidopa/levodopa 50/200 at bedtime for restless leg.  -he and I discussed potentially adding klonopin at bedtime for RLS and dystonia but decided not to right now, esp given the fact he is not walking b/c of recent surgery (or not supposed to be) and is unsteady. Can revisit in the future.  -he wants to do PT but wants to wait until can walk on foot.  Just had surgery.  Will let me know when needs referral.    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 He and I discussed the value of Botox. Wants to hold on that and states that his chiropractor is working on that.  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  4.dysphagia -mild. Doesn't want to do MBE at this time  5.  Atrial fibrillation  -Following with cardiology.  On diltiazem.  6.  Factor V Leiden  -On Coumadin.   Subjective:   Stanley Wright was seen today in follow up for Parkinsons disease.  My previous records were reviewed prior to todays visit as well as outside records available to me.  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  He did not find it helpful and we ultimately decided to discontinue the Botox.  Waking up at night and taking carbidopa/levodopa 25/100 for RLS and toe curling and feels anxious/restless.    Pt had 1 fall - was at rear of garage and it slopes and the R leg gave out he went down on his butt.  He was able to get up without trouble.  Pt denies lightheadedness, near syncope.  No hallucinations.  Mood has been good.  Patient just had surgery on March 10 for osteoarthritis of the right foot.  He had talonavicular fusion.  Current prescribed movement disorder medications: Carbidopa/levodopa 25/100, 1 tablet 4 times per day Carbidopa/levodopa to 50/200 at bedtime   PREVIOUS MEDICATIONS: Sinemet and Sinemet CR  ALLERGIES:   Allergies  Allergen Reactions  . Tylenol [Acetaminophen] Other (See Comments)    Pt states he had a DNA test completed that stated he should never  take tylenol.    CURRENT MEDICATIONS:  Outpatient Encounter Medications as of 10/03/2019  Medication Sig  . carbidopa-levodopa (SINEMET CR) 50-200 MG tablet TAKE 1 TABLET BY MOUTH AT BEDTIME  . carbidopa-levodopa (SINEMET IR) 25-100 MG tablet 1 tablet 4-5 times per day (Patient taking differently: Take 1 tablet by mouth 5 (five) times daily. )  . CARTIA XT 300 MG 24 hr capsule Take 1 capsule by mouth once daily  . enoxaparin (LOVENOX) 120 MG/0.8ML injection Inject 0.8 mLs (120 mg total) into the skin  every 12 (twelve) hours.  . furosemide (LASIX) 40 MG tablet Take 2 tablets (80 mg) on Monday, Wednesday, Friday; 1 tablet (40 mg) all other days. (Patient taking differently: Take 40-80 mg by mouth See admin instructions. Take 80 mg on Monday, Wednesday, Friday and 40 mg all other days.)  . warfarin (COUMADIN) 5 MG tablet TAKE 1 TABLET BY MOUTH ONCE DAILY OR  AS  DIRECTED  BY  COUMADIN  CLINIC (Patient taking differently: Take 5 mg by mouth daily. )  . [DISCONTINUED] oxyCODONE (OXY IR/ROXICODONE) 5 MG immediate release tablet Take 1 tablet (5 mg total) by mouth every 4 (four) hours as needed for severe pain. (Patient not taking: Reported on 10/03/2019)   No facility-administered encounter medications on file as of 10/03/2019.    Objective:   PHYSICAL EXAMINATION:    VITALS:   Vitals:   10/03/19 0802  BP: 105/74  Pulse: 80  SpO2: 99%  Weight: 270 lb (122.5 kg)  Height: 6' (1.829 m)    GEN:  The patient appears stated age and is in NAD. HEENT:  Normocephalic, atraumatic.  The mucous membranes are moist. The superficial temporal arteries are without ropiness or tenderness. CV:  RRR Lungs:  CTAB Neck/HEME:  There are no carotid bruits bilaterally.  Neurological examination:  Orientation: The patient is alert and oriented x3. Cranial nerves: There is good facial symmetry with facial hypomimia. The speech is fluent and dysarthric. Soft palate rises symmetrically and there is no tongue deviation. Hearing is intact to conversational tone. Sensation: Sensation is intact to light touch throughout Motor: Strength is at least antigravity x4.  Movement examination: Tone: There is normal tone in the UE/LE Abnormal movements: none Coordination:  There is decremation with RAM's,with any form of RAMS, including alternating supination and pronation of the forearm, hand opening and closing, finger taps, heel taps and toe taps. Gait and Station: not tested due to in [facility] due to recent foot surgery    Total time spent on today's visit was 30 minutes, including both face-to-face time and nonface-to-face time.  Time included that spent on review of records (prior notes available to me/labs/imaging if pertinent), discussing treatment and goals, answering patient's questions and coordinating care.  Cc:  Scifres, Dorothy, [employer]

## 2019-10-03 ENCOUNTER — Encounter: Payer: Self-pay | Admitting: Neurology

## 2019-10-03 ENCOUNTER — Other Ambulatory Visit: Payer: Self-pay

## 2019-10-03 ENCOUNTER — Ambulatory Visit (INDEPENDENT_AMBULATORY_CARE_PROVIDER_SITE_OTHER): Payer: PPO | Admitting: Neurology

## 2019-10-03 VITALS — BP 105/74 | HR 80 | Ht 72.0 in | Wt 270.0 lb

## 2019-10-03 DIAGNOSIS — G232 Striatonigral degeneration: Secondary | ICD-10-CM

## 2019-10-03 DIAGNOSIS — G243 Spasmodic torticollis: Secondary | ICD-10-CM | POA: Diagnosis not present

## 2019-10-03 NOTE — Patient Instructions (Signed)
Call me when you want the Physical therapy referral Call me when you need the carbidopa/levodopa 25/100 refilled.  The physicians and staff at [facility] are committed to providing excellent care. You may receive a survey requesting feedback about your experience at our office. We strive to receive "very good" responses to the survey questions. If you feel that your experience would prevent you from giving the office a "very good " response, please contact our office to try to remedy the situation. We may be reached at [phone]. Thank you for taking the time out of your busy day to complete the survey.

## 2019-10-06 ENCOUNTER — Other Ambulatory Visit: Payer: Self-pay

## 2019-10-06 ENCOUNTER — Ambulatory Visit (INDEPENDENT_AMBULATORY_CARE_PROVIDER_SITE_OTHER): Payer: PPO | Admitting: *Deleted

## 2019-10-06 DIAGNOSIS — I4819 Other persistent atrial fibrillation: Secondary | ICD-10-CM

## 2019-10-06 DIAGNOSIS — Z5181 Encounter for therapeutic drug level monitoring: Secondary | ICD-10-CM | POA: Diagnosis not present

## 2019-10-06 LAB — POCT INR: INR: 1.7 — AB (ref 2.0–3.0)

## 2019-10-06 NOTE — Patient Instructions (Signed)
Description   Take 1.5 tablets today and then start taking 1 tablet daily except 1.5 tablets on Mondays an Fridays. Recheck INR in 2 weeks .Call with any questions if gets any new medications or scheduled for any procedures  [phone]

## 2019-10-20 ENCOUNTER — Ambulatory Visit (INDEPENDENT_AMBULATORY_CARE_PROVIDER_SITE_OTHER): Payer: PPO | Admitting: *Deleted

## 2019-10-20 ENCOUNTER — Encounter: Payer: Self-pay | Admitting: Physician Assistant

## 2019-10-20 ENCOUNTER — Other Ambulatory Visit: Payer: Self-pay

## 2019-10-20 ENCOUNTER — Ambulatory Visit (INDEPENDENT_AMBULATORY_CARE_PROVIDER_SITE_OTHER): Payer: PPO | Admitting: Physician Assistant

## 2019-10-20 ENCOUNTER — Ambulatory Visit (INDEPENDENT_AMBULATORY_CARE_PROVIDER_SITE_OTHER): Payer: PPO

## 2019-10-20 DIAGNOSIS — Z5181 Encounter for therapeutic drug level monitoring: Secondary | ICD-10-CM

## 2019-10-20 DIAGNOSIS — D6851 Activated protein C resistance: Secondary | ICD-10-CM

## 2019-10-20 DIAGNOSIS — M19071 Primary osteoarthritis, right ankle and foot: Secondary | ICD-10-CM | POA: Diagnosis not present

## 2019-10-20 DIAGNOSIS — I4819 Other persistent atrial fibrillation: Secondary | ICD-10-CM | POA: Diagnosis not present

## 2019-10-20 LAB — POCT INR: INR: 2.7 (ref 2.0–3.0)

## 2019-10-20 NOTE — Progress Notes (Signed)
Office Visit Note   Patient: Stanley Wright           Date of Birth: [birth year]           MRN: [id number] Visit Date: 10/20/2019              Requested by: Maude Leriche, PA-C [address] [place],  [place] [address] PCP: Maude Leriche, PA-C  Chief Complaint  Patient presents with  . Right Foot - Follow-up      HPI: Patient presents today 8 weeks status post right talonavicular and navicular medial cuneiform arthrodesis.  He is using compression socks on both of his lower extremities.  He is weightbearing as tolerated in his postop shoe however he still is using his knee scooter he has no complaints  Assessment & Plan: Visit Diagnoses:  1. Primary osteoarthritis, right ankle and foot     Plan: Cannot appreciate significant bony healing across the fusion sites.  Hardware is intact.  Patient will follow up in 4 weeks we discussed appropriate shoewear that is both stiff and has some arch support in it we should get x-rays at his next visit in 4 weeks  Follow-Up Instructions: No follow-ups on file.   Ortho Exam  Patient is alert, oriented, no adenopathy, well-dressed, normal affect, normal respiratory effort. Focused examination of his right foot demonstrates moderate soft tissue swelling but equivalent to the left side.  Pulses are palpable compartments soft and compressible skin is in good condition well-healed surgical incision nontender over the fusion sites  Imaging: No results found. No images are attached to the encounter.  Labs: Lab Results  Component Value Date   HGBA1C 6.3 11/21/2015   HGBA1C 6.2 06/12/2015   REPTSTATUS 12/24/2017 FINAL 12/23/2017   CULT  12/23/2017    NO GROWTH Performed at [facility], [place] [address] [place], [place] [address]    LABORGA ESCHERICHIA COLI 12/22/2017     Lab Results  Component Value Date   ALBUMIN 4.1 01/17/2019   ALBUMIN 2.9 (L) 12/26/2017   ALBUMIN 3.0 (L) 12/25/2017    Lab Results    Component Value Date   MG 1.9 12/25/2017   No results found for: VD25OH  No results found for: PREALBUMIN CBC EXTENDED Latest Ref Rng & Units 08/24/2019 12/26/2017 12/25/2017  WBC 4.0 - 10.5 K/uL 4.7 4.7 4.8  RBC 4.22 - 5.81 MIL/uL 4.36 4.22 4.47  HGB 13.0 - 17.0 g/dL 14.1 13.6 14.3  HCT 39.0 - 52.0 % 43.7 40.8 43.3  PLT 150 - 400 K/uL 169 118(L) 107(L)  NEUTROABS 1.7 - 7.7 K/uL - - -  LYMPHSABS 0.7 - 4.0 K/uL - - -     There is no height or weight on file to calculate BMI.  Orders:  Orders Placed This Encounter  Procedures  . XR Foot Complete Right   No orders of the defined types were placed in this encounter.    Procedures: No procedures performed  Clinical Data: No additional findings.  ROS:  All other systems negative, except as noted in the HPI. Review of Systems  Objective: Vital Signs: There were no vitals taken for this visit.  Specialty Comments:  No specialty comments available.  PMFS History: Patient Active Problem List   Diagnosis Date Noted  . Primary osteoarthritis, right ankle and foot   . Lower extremity edema 08/10/2019  . Acute DVT (deep venous thrombosis) ([place]) 07/19/2019  . History of pulmonary embolism 07/19/2019  . Parkinson's disease (  [place]) 02/03/2018  . Community acquired pneumonia of left lower lobe of lung 12/23/2017  . Sepsis ([place]) 12/23/2017  . Morbid obesity due to excess calories ([place]) 08/23/2016  . OSA on CPAP 08/23/2016  . Dyspnea on exertion 08/22/2016  . Umbilical hernia [id number]  . Persistent atrial fibrillation ([place])   . Encounter for therapeutic drug monitoring 08/12/2013  . Long term current use of anticoagulant 07/30/2010  . COLONIC POLYPS 06/03/2010  . Factor V Leiden ([place]) 06/03/2010  . GERD 06/03/2010  . HIATAL HERNIA 06/03/2010  . BENIGN PROSTATIC HYPERTROPHY, WITH OBSTRUCTION 06/03/2010  . PSORIASIS 06/03/2010   Past Medical History:  Diagnosis Date  . Arthritis   . BENIGN PROSTATIC HYPERTROPHY, WITH  OBSTRUCTION   . Cancer (HCC)    skin, basal, squamous  . COLONIC POLYPS   . Diverticulitis   . DVT (deep venous thrombosis) ([place]) 2000  . Dysrhythmia    Hx Afib- 2017  . Early cataract   . Factor 5 Leiden mutation, heterozygous ([place])   . GERD (gastroesophageal reflux disease)    not on medication  . Hearing aid worn    B/L  . HIATAL HERNIA   . ITP (idiopathic thrombocytopenic purpura) 2003  . Long term current use of anticoagulant   . Osteoarthritis    right foot  . Parkinson's disease ([place]) 02/03/2018  . PSORIASIS   . Pulmonary embolus ([place]) 2000  . Shortness of breath dyspnea    at times - Talking alot  . Sleep apnea   . Wears glasses     Family History  Problem Relation Age of Onset  . Cancer Father   . Breast cancer Sister   . Heart disease Brother   . Cancer Maternal Grandmother   . Stroke Paternal Grandfather     Past Surgical History:  Procedure Laterality Date  . ANKLE FUSION Right 08/24/2019   Procedure: RIGHT TALO-NAVICULAR FUSION;  Surgeon: Newt Minion, MD;  Location: [facility];  Service: Orthopedics;  Laterality: Right;  . CARDIOVERSION N/A 11/22/2015   Procedure: CARDIOVERSION;  Surgeon: Sanda Klein, MD;  [facility];  Service: Cardiovascular;  Laterality: N/A;  . COLONOSCOPY    . HERNIA REPAIR Left    Inguinal- x2 . mesh x1  . INSERTION OF MESH N/A 02/25/2016   Procedure: INSERTION OF MESH;  Surgeon: Rolm Bookbinder, MD;  Location: [facility];  Service: General;  Laterality: N/A;  . LUNG REMOVAL, PARTIAL Right 2004   was fungal and not cancerous  . PROSTATE SURGERY    . UMBILICAL HERNIA REPAIR N/A 02/25/2016   Procedure: LAPAROSCOPIC UMBILICAL HERNIA REPAIR;  Surgeon: Rolm Bookbinder, MD;  Location: [facility];  Service: General;  Laterality: N/A;   Social History   Occupational History    Employer: RETIRED    Comment: Engineer, structural  Tobacco Use  . Smoking status: Former Smoker    Packs/day: 1.00    Quit date: 06/16/1985    Years since  quitting: 34.3  . Smokeless tobacco: Never Used  . Tobacco comment: quit approx age 21-   Substance and Sexual Activity  . Alcohol use: No  . Drug use: No  . Sexual activity: Yes

## 2019-10-20 NOTE — Patient Instructions (Signed)
Description   Continue taking 1 tablet daily except 1.5 tablets on Mondays an Fridays. Recheck INR in 3 weeks. Call with any questions if gets any new medications or scheduled for any procedures  [phone]

## 2019-10-31 DIAGNOSIS — G44209 Tension-type headache, unspecified, not intractable: Secondary | ICD-10-CM | POA: Diagnosis not present

## 2019-10-31 DIAGNOSIS — M47812 Spondylosis without myelopathy or radiculopathy, cervical region: Secondary | ICD-10-CM | POA: Diagnosis not present

## 2019-10-31 DIAGNOSIS — M542 Cervicalgia: Secondary | ICD-10-CM | POA: Diagnosis not present

## 2019-10-31 DIAGNOSIS — M9901 Segmental and somatic dysfunction of cervical region: Secondary | ICD-10-CM | POA: Diagnosis not present

## 2019-11-07 DIAGNOSIS — M47812 Spondylosis without myelopathy or radiculopathy, cervical region: Secondary | ICD-10-CM | POA: Diagnosis not present

## 2019-11-07 DIAGNOSIS — M542 Cervicalgia: Secondary | ICD-10-CM | POA: Diagnosis not present

## 2019-11-07 DIAGNOSIS — M9901 Segmental and somatic dysfunction of cervical region: Secondary | ICD-10-CM | POA: Diagnosis not present

## 2019-11-07 DIAGNOSIS — G44209 Tension-type headache, unspecified, not intractable: Secondary | ICD-10-CM | POA: Diagnosis not present

## 2019-11-10 ENCOUNTER — Ambulatory Visit (INDEPENDENT_AMBULATORY_CARE_PROVIDER_SITE_OTHER): Payer: PPO

## 2019-11-10 ENCOUNTER — Other Ambulatory Visit: Payer: Self-pay

## 2019-11-10 DIAGNOSIS — Z5181 Encounter for therapeutic drug level monitoring: Secondary | ICD-10-CM | POA: Diagnosis not present

## 2019-11-10 DIAGNOSIS — D6851 Activated protein C resistance: Secondary | ICD-10-CM | POA: Diagnosis not present

## 2019-11-10 DIAGNOSIS — I4819 Other persistent atrial fibrillation: Secondary | ICD-10-CM

## 2019-11-10 LAB — POCT INR: INR: 2.3 (ref 2.0–3.0)

## 2019-11-10 NOTE — Patient Instructions (Signed)
Description   Continue on same dosage 1 tablet daily except 1.5 tablets on Mondays and Fridays. Recheck INR in 4 weeks. Call with any questions if gets any new medications or scheduled for any procedures  [phone]

## 2019-11-16 DIAGNOSIS — M9901 Segmental and somatic dysfunction of cervical region: Secondary | ICD-10-CM | POA: Diagnosis not present

## 2019-11-16 DIAGNOSIS — M542 Cervicalgia: Secondary | ICD-10-CM | POA: Diagnosis not present

## 2019-11-16 DIAGNOSIS — M47812 Spondylosis without myelopathy or radiculopathy, cervical region: Secondary | ICD-10-CM | POA: Diagnosis not present

## 2019-11-16 DIAGNOSIS — G44209 Tension-type headache, unspecified, not intractable: Secondary | ICD-10-CM | POA: Diagnosis not present

## 2019-11-19 ENCOUNTER — Other Ambulatory Visit: Payer: Self-pay | Admitting: Cardiology

## 2019-11-19 ENCOUNTER — Other Ambulatory Visit: Payer: Self-pay | Admitting: Neurology

## 2019-11-21 ENCOUNTER — Ambulatory Visit (INDEPENDENT_AMBULATORY_CARE_PROVIDER_SITE_OTHER): Payer: PPO | Admitting: Physician Assistant

## 2019-11-21 ENCOUNTER — Other Ambulatory Visit: Payer: Self-pay

## 2019-11-21 ENCOUNTER — Encounter: Payer: Self-pay | Admitting: Physician Assistant

## 2019-11-21 ENCOUNTER — Ambulatory Visit (INDEPENDENT_AMBULATORY_CARE_PROVIDER_SITE_OTHER): Payer: PPO

## 2019-11-21 VITALS — Ht 72.0 in | Wt 270.0 lb

## 2019-11-21 DIAGNOSIS — M542 Cervicalgia: Secondary | ICD-10-CM | POA: Diagnosis not present

## 2019-11-21 DIAGNOSIS — M19071 Primary osteoarthritis, right ankle and foot: Secondary | ICD-10-CM | POA: Diagnosis not present

## 2019-11-21 DIAGNOSIS — M25571 Pain in right ankle and joints of right foot: Secondary | ICD-10-CM | POA: Diagnosis not present

## 2019-11-21 DIAGNOSIS — G44209 Tension-type headache, unspecified, not intractable: Secondary | ICD-10-CM | POA: Diagnosis not present

## 2019-11-21 DIAGNOSIS — G8929 Other chronic pain: Secondary | ICD-10-CM | POA: Diagnosis not present

## 2019-11-21 DIAGNOSIS — M47812 Spondylosis without myelopathy or radiculopathy, cervical region: Secondary | ICD-10-CM | POA: Diagnosis not present

## 2019-11-21 DIAGNOSIS — M9901 Segmental and somatic dysfunction of cervical region: Secondary | ICD-10-CM | POA: Diagnosis not present

## 2019-11-21 NOTE — Progress Notes (Signed)
Office Visit Note   Patient: Stanley Wright           Date of Birth: January 12, 1946           MRN: [id number] Visit Date: 11/21/2019              Requested by: Maude Leriche, PA-C [place],  [place] [address] PCP: Maude Leriche, PA-C  Chief Complaint  Patient presents with  . Right Foot - Routine Post Op, Follow-up    08/24/19 right ankle fusion talonavicular      HPI: The patient is 3 months status post right talonavicular fusion and medial cuneiform fusion.  He has transitioned to a regular shoe.  He states his pain level is a 2-3 out of 10.  He feels his biggest difficulty is rolling over his foot with gait.  Also a history of Parkinson's and he is resuming physical therapy  Assessment & Plan: Visit Diagnoses:  1. Chronic pain of right ankle   2. Primary osteoarthritis, right ankle and foot     Plan: I will give patient a prescription to work with his physical therapist on gait training fall prevention.  He will follow-up in 2 months.  X-rays of his foot should be taken at that time  Follow-Up Instructions: No follow-ups on file.   Ortho Exam  Patient is alert, oriented, no adenopathy, well-dressed, normal affect, normal respiratory effort.  Surgical incisions well-healed mild to moderate soft tissue swelling but also present on the other foot secondary to venous stasis disease.  Nontender over the fusion site some mild tenderness over the lateral ankle.  No erythema   Imaging: No results found. No images are attached to the encounter.  Labs: Lab Results  Component Value Date   HGBA1C 6.3 11/21/2015   HGBA1C 6.2 06/12/2015   REPTSTATUS 12/24/2017 FINAL 12/23/2017   CULT  12/23/2017    NO GROWTH Performed at [facility], [place] [address] [place], [place] [address]    LABORGA ESCHERICHIA COLI 12/22/2017     Lab Results  Component Value Date   ALBUMIN 4.1 01/17/2019   ALBUMIN 2.9 (L) 12/26/2017   ALBUMIN 3.0 (L) 12/25/2017     Lab Results  Component Value Date   MG 1.9 12/25/2017   No results found for: VD25OH  No results found for: PREALBUMIN CBC EXTENDED Latest Ref Rng & Units 08/24/2019 12/26/2017 12/25/2017  WBC 4.0 - 10.5 K/uL 4.7 4.7 4.8  RBC 4.22 - 5.81 MIL/uL 4.36 4.22 4.47  HGB 13.0 - 17.0 g/dL 14.1 13.6 14.3  HCT 39.0 - 52.0 % 43.7 40.8 43.3  PLT 150 - 400 K/uL 169 118(L) 107(L)  NEUTROABS 1.7 - 7.7 K/uL - - -  LYMPHSABS 0.7 - 4.0 K/uL - - -     Body mass index is 36.62 kg/m.  Orders:  Orders Placed This Encounter  Procedures  . XR Foot 2 Views Right   No orders of the defined types were placed in this encounter.    Procedures: No procedures performed  Clinical Data: No additional findings.  ROS:  All other systems negative, except as noted in the HPI. Review of Systems  Objective: Vital Signs: Ht 6' (1.829 m)   Wt 270 lb (122.5 kg)   BMI 36.62 kg/m   Specialty Comments:  No specialty comments available.  PMFS History: Patient Active Problem List   Diagnosis Date Noted  . Primary osteoarthritis, right ankle and foot   . Lower  extremity edema 08/10/2019  . Acute DVT (deep venous thrombosis) ([place]) 07/19/2019  . History of pulmonary embolism 07/19/2019  . Parkinson's disease ([place]) 02/03/2018  . Community acquired pneumonia of left lower lobe of lung 12/23/2017  . Sepsis ([place]) 12/23/2017  . Morbid obesity due to excess calories ([place]) 08/23/2016  . OSA on CPAP 08/23/2016  . Dyspnea on exertion 08/22/2016  . Umbilical hernia 24/02/7352  . Persistent atrial fibrillation ([place])   . Encounter for therapeutic drug monitoring 08/12/2013  . Long term current use of anticoagulant 07/30/2010  . COLONIC POLYPS 06/03/2010  . Factor V Leiden ([place]) 06/03/2010  . GERD 06/03/2010  . HIATAL HERNIA 06/03/2010  . BENIGN PROSTATIC HYPERTROPHY, WITH OBSTRUCTION 06/03/2010  . PSORIASIS 06/03/2010   Past Medical History:  Diagnosis Date  . Arthritis   . BENIGN PROSTATIC  HYPERTROPHY, WITH OBSTRUCTION   . Cancer (HCC)    skin, basal, squamous  . COLONIC POLYPS   . Diverticulitis   . DVT (deep venous thrombosis) ([place]) 2000  . Dysrhythmia    Hx Afib- 2017  . Early cataract   . Factor 5 Leiden mutation, heterozygous ([place])   . GERD (gastroesophageal reflux disease)    not on medication  . Hearing aid worn    B/L  . HIATAL HERNIA   . ITP (idiopathic thrombocytopenic purpura) 2003  . Long term current use of anticoagulant   . Osteoarthritis    right foot  . Parkinson's disease ([place]) 02/03/2018  . PSORIASIS   . Pulmonary embolus ([place]) 2000  . Shortness of breath dyspnea    at times - Talking alot  . Sleep apnea   . Wears glasses     Family History  Problem Relation Age of Onset  . Cancer Father   . Breast cancer Sister   . Heart disease Brother   . Cancer Maternal Grandmother   . Stroke Paternal Grandfather     Past Surgical History:  Procedure Laterality Date  . ANKLE FUSION Right 08/24/2019   Procedure: RIGHT TALO-NAVICULAR FUSION;  Surgeon: Newt Minion, MD;  Location: [facility];  Service: Orthopedics;  Laterality: Right;  . CARDIOVERSION N/A 11/22/2015   Procedure: CARDIOVERSION;  Surgeon: Sanda Klein, MD;  [facility];  Service: Cardiovascular;  Laterality: N/A;  . COLONOSCOPY    . HERNIA REPAIR Left    Inguinal- x2 . mesh x1  . INSERTION OF MESH N/A 02/25/2016   Procedure: INSERTION OF MESH;  Surgeon: Rolm Bookbinder, MD;  Location: [facility];  Service: General;  Laterality: N/A;  . LUNG REMOVAL, PARTIAL Right 2004   was fungal and not cancerous  . PROSTATE SURGERY    . UMBILICAL HERNIA REPAIR N/A 02/25/2016   Procedure: LAPAROSCOPIC UMBILICAL HERNIA REPAIR;  Surgeon: Rolm Bookbinder, MD;  Location: [facility];  Service: General;  Laterality: N/A;   Social History   Occupational History    Employer: RETIRED    Comment: Engineer, structural  Tobacco Use  . Smoking status: Former Smoker    Packs/day: 1.00    Quit date: 06/16/1985     Years since quitting: 34.4  . Smokeless tobacco: Never Used  . Tobacco comment: quit approx age 50-   Substance and Sexual Activity  . Alcohol use: No  . Drug use: No  . Sexual activity: Yes

## 2019-11-24 ENCOUNTER — Other Ambulatory Visit: Payer: Self-pay

## 2019-11-24 ENCOUNTER — Ambulatory Visit: Payer: PPO | Admitting: Occupational Therapy

## 2019-11-24 ENCOUNTER — Ambulatory Visit: Payer: PPO | Attending: Neurology | Admitting: Physical Therapy

## 2019-11-24 ENCOUNTER — Ambulatory Visit: Payer: PPO

## 2019-11-24 DIAGNOSIS — G20 Parkinson's disease: Secondary | ICD-10-CM

## 2019-11-24 DIAGNOSIS — R471 Dysarthria and anarthria: Secondary | ICD-10-CM

## 2019-11-24 DIAGNOSIS — R2689 Other abnormalities of gait and mobility: Secondary | ICD-10-CM | POA: Insufficient documentation

## 2019-11-24 DIAGNOSIS — R278 Other lack of coordination: Secondary | ICD-10-CM

## 2019-11-24 NOTE — Therapy (Signed)
[facility] [address] [place], [place], [address] Phone: [phone]   Fax:  [phone]  Patient Details  Name: Stanley Wright MRN: [id number] Date of Birth: October 06, 1945 Referring Provider:  Ludwig Clarks, DO  Encounter Date: 11/24/2019   Physical Therapy Parkinson's Disease Screen   Timed Up and Go test: 15.53  sec  10 meter walk test: 12.46 sec = 2.63 ft/sec  5 time sit to stand test: 14.75 sec  Patient would benefit from Physical Therapy evaluation due to slowed mobility measures s/p having foot surgery as well as reported falls.     Pt has had multiple falls since last bout of therapy.  He has had surgery on R foot (at least 3 months ago) and has script from orthopedic MD for therapy to address R foot.    [name] 11/24/2019, 8:15 AM  Mady Haagensen, PT 11/24/19 8:23 AM Phone: [phone] Fax: [facility] [address] [place], [place], [address] Phone: [phone]   Fax:  [phone]

## 2019-11-24 NOTE — Addendum Note (Signed)
Addended by: Alonza Bogus S on: 11/24/2019 05:15 PM   Modules accepted: Orders

## 2019-11-24 NOTE — Therapy (Signed)
[facility] [address] [place], [place], [address] Phone: [phone]   Fax:  [phone]  Patient Details  Name: Stanley Wright MRN: [id number] Date of Birth: December 28, 1945 Referring Provider:  Maude Leriche, PA-C  Encounter Date: 11/24/2019 Occupational Therapy Parkinson's Disease Screen  Hand dominance:  right  Physical Performance Test item #2 (simulated eating):  11.87 sec  Fastening/unfastening 3 buttons in:  31.94sec  9-hole peg test:    RUE  28.32 sec        LUE  26.10 sec  Change in ability to perform ADLs/IADLs:  No, pt denies changes    Pt does not require occupational therapy services at this time.  Recommended occupational therapy screen in   6 mons  Eden Rho 11/24/2019, 8:01 AM Theone Murdoch, OTR/L [phone] Phone: [phone] 8:18 AM 11/24/19 [facility] [address] [place], [place], [address] Phone: [phone]   Fax:  [phone]

## 2019-11-24 NOTE — Therapy (Signed)
[facility] [address] [place], [place], [address] Phone: [phone]   Fax:  [phone]  Patient Details  Name: Stanley Wright MRN: [id number] Date of Birth: [birth year] Referring Provider:  Ludwig Clarks, DO  Encounter Date: 11/24/2019  Speech Therapy Parkinson's Disease Screen   Decibel Level today: mid-upper 60sdB  (WNL=70-72 dB) with sound level meter 30cm away from pt's mouth, masked. Pt's conversational volume has remained essentially the same since last treatment course. Pt cont to have short rushes of speech which hinder his intelligibility to this trained listener. He reports that people ask him to repeat. 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 "I have a lot of trouble communicating," pt stated.  Pt told SLP he "has to be really careful" when he eats or drinks. If he is not, then he reports he will cough.  Pt would benefit from speech-language eval for dysarthria -please order via EPIC or call [phone] to schedule    [facility] ,[place], [place]  11/24/2019, 8:46 AM  [facility] [address] [place], [place], [address] Phone: [phone]   Fax:  [phone]

## 2019-11-28 DIAGNOSIS — M47812 Spondylosis without myelopathy or radiculopathy, cervical region: Secondary | ICD-10-CM | POA: Diagnosis not present

## 2019-11-28 DIAGNOSIS — M9901 Segmental and somatic dysfunction of cervical region: Secondary | ICD-10-CM | POA: Diagnosis not present

## 2019-11-28 DIAGNOSIS — G44209 Tension-type headache, unspecified, not intractable: Secondary | ICD-10-CM | POA: Diagnosis not present

## 2019-11-28 DIAGNOSIS — M542 Cervicalgia: Secondary | ICD-10-CM | POA: Diagnosis not present

## 2019-12-05 DIAGNOSIS — M9901 Segmental and somatic dysfunction of cervical region: Secondary | ICD-10-CM | POA: Diagnosis not present

## 2019-12-05 DIAGNOSIS — G44209 Tension-type headache, unspecified, not intractable: Secondary | ICD-10-CM | POA: Diagnosis not present

## 2019-12-05 DIAGNOSIS — M542 Cervicalgia: Secondary | ICD-10-CM | POA: Diagnosis not present

## 2019-12-05 DIAGNOSIS — M47812 Spondylosis without myelopathy or radiculopathy, cervical region: Secondary | ICD-10-CM | POA: Diagnosis not present

## 2019-12-06 ENCOUNTER — Other Ambulatory Visit: Payer: Self-pay | Admitting: Neurology

## 2019-12-07 NOTE — Telephone Encounter (Signed)
Rx(s) sent to pharmacy electronically.  

## 2019-12-09 ENCOUNTER — Other Ambulatory Visit: Payer: Self-pay

## 2019-12-09 ENCOUNTER — Ambulatory Visit (INDEPENDENT_AMBULATORY_CARE_PROVIDER_SITE_OTHER): Payer: PPO | Admitting: Pharmacist

## 2019-12-09 DIAGNOSIS — D6851 Activated protein C resistance: Secondary | ICD-10-CM

## 2019-12-09 DIAGNOSIS — I4819 Other persistent atrial fibrillation: Secondary | ICD-10-CM

## 2019-12-09 DIAGNOSIS — Z5181 Encounter for therapeutic drug level monitoring: Secondary | ICD-10-CM | POA: Diagnosis not present

## 2019-12-09 LAB — POCT INR: INR: 1.7 — AB (ref 2.0–3.0)

## 2019-12-09 NOTE — Patient Instructions (Signed)
Take 2 tablets today then continue on same dosage 1 tablet daily except 1.5 tablets on Mondays and Fridays. Recheck INR in 3 weeks. Call with any questions if gets any new medications or scheduled for any procedures  [phone]

## 2019-12-12 DIAGNOSIS — G44209 Tension-type headache, unspecified, not intractable: Secondary | ICD-10-CM | POA: Diagnosis not present

## 2019-12-12 DIAGNOSIS — M47812 Spondylosis without myelopathy or radiculopathy, cervical region: Secondary | ICD-10-CM | POA: Diagnosis not present

## 2019-12-12 DIAGNOSIS — M9901 Segmental and somatic dysfunction of cervical region: Secondary | ICD-10-CM | POA: Diagnosis not present

## 2019-12-12 DIAGNOSIS — M542 Cervicalgia: Secondary | ICD-10-CM | POA: Diagnosis not present

## 2019-12-13 DIAGNOSIS — H35033 Hypertensive retinopathy, bilateral: Secondary | ICD-10-CM | POA: Diagnosis not present

## 2019-12-13 DIAGNOSIS — I5032 Chronic diastolic (congestive) heart failure: Secondary | ICD-10-CM | POA: Diagnosis not present

## 2019-12-13 DIAGNOSIS — G20 Parkinson's disease: Secondary | ICD-10-CM | POA: Diagnosis not present

## 2019-12-13 DIAGNOSIS — I482 Chronic atrial fibrillation, unspecified: Secondary | ICD-10-CM | POA: Diagnosis not present

## 2019-12-19 DIAGNOSIS — M47812 Spondylosis without myelopathy or radiculopathy, cervical region: Secondary | ICD-10-CM | POA: Diagnosis not present

## 2019-12-19 DIAGNOSIS — G44209 Tension-type headache, unspecified, not intractable: Secondary | ICD-10-CM | POA: Diagnosis not present

## 2019-12-19 DIAGNOSIS — M9901 Segmental and somatic dysfunction of cervical region: Secondary | ICD-10-CM | POA: Diagnosis not present

## 2019-12-19 DIAGNOSIS — M542 Cervicalgia: Secondary | ICD-10-CM | POA: Diagnosis not present

## 2019-12-30 ENCOUNTER — Other Ambulatory Visit: Payer: Self-pay

## 2019-12-30 ENCOUNTER — Ambulatory Visit (INDEPENDENT_AMBULATORY_CARE_PROVIDER_SITE_OTHER): Payer: PPO

## 2019-12-30 DIAGNOSIS — D6851 Activated protein C resistance: Secondary | ICD-10-CM | POA: Diagnosis not present

## 2019-12-30 DIAGNOSIS — I4819 Other persistent atrial fibrillation: Secondary | ICD-10-CM | POA: Diagnosis not present

## 2019-12-30 DIAGNOSIS — Z5181 Encounter for therapeutic drug level monitoring: Secondary | ICD-10-CM

## 2019-12-30 LAB — POCT INR: INR: 1.9 — AB (ref 2.0–3.0)

## 2019-12-30 NOTE — Patient Instructions (Signed)
Take 2 tablets today then continue on same dosage 1 tablet daily except 1.5 tablets on Mondays, Wednesdays and Fridays. Recheck INR in 3 weeks. Call with any questions if gets any new medications or scheduled for any procedures  [phone]

## 2020-01-02 DIAGNOSIS — M47812 Spondylosis without myelopathy or radiculopathy, cervical region: Secondary | ICD-10-CM | POA: Diagnosis not present

## 2020-01-02 DIAGNOSIS — G44209 Tension-type headache, unspecified, not intractable: Secondary | ICD-10-CM | POA: Diagnosis not present

## 2020-01-02 DIAGNOSIS — M542 Cervicalgia: Secondary | ICD-10-CM | POA: Diagnosis not present

## 2020-01-02 DIAGNOSIS — M9901 Segmental and somatic dysfunction of cervical region: Secondary | ICD-10-CM | POA: Diagnosis not present

## 2020-01-09 DIAGNOSIS — G44209 Tension-type headache, unspecified, not intractable: Secondary | ICD-10-CM | POA: Diagnosis not present

## 2020-01-09 DIAGNOSIS — M9901 Segmental and somatic dysfunction of cervical region: Secondary | ICD-10-CM | POA: Diagnosis not present

## 2020-01-09 DIAGNOSIS — M47812 Spondylosis without myelopathy or radiculopathy, cervical region: Secondary | ICD-10-CM | POA: Diagnosis not present

## 2020-01-09 DIAGNOSIS — M542 Cervicalgia: Secondary | ICD-10-CM | POA: Diagnosis not present

## 2020-01-16 DIAGNOSIS — M542 Cervicalgia: Secondary | ICD-10-CM | POA: Diagnosis not present

## 2020-01-16 DIAGNOSIS — G44209 Tension-type headache, unspecified, not intractable: Secondary | ICD-10-CM | POA: Diagnosis not present

## 2020-01-16 DIAGNOSIS — G20 Parkinson's disease: Secondary | ICD-10-CM | POA: Diagnosis not present

## 2020-01-16 DIAGNOSIS — Z7901 Long term (current) use of anticoagulants: Secondary | ICD-10-CM | POA: Diagnosis not present

## 2020-01-16 DIAGNOSIS — M9901 Segmental and somatic dysfunction of cervical region: Secondary | ICD-10-CM | POA: Diagnosis not present

## 2020-01-16 DIAGNOSIS — K5901 Slow transit constipation: Secondary | ICD-10-CM | POA: Diagnosis not present

## 2020-01-16 DIAGNOSIS — M47812 Spondylosis without myelopathy or radiculopathy, cervical region: Secondary | ICD-10-CM | POA: Diagnosis not present

## 2020-01-16 DIAGNOSIS — Z86010 Personal history of colonic polyps: Secondary | ICD-10-CM | POA: Diagnosis not present

## 2020-01-17 ENCOUNTER — Telehealth: Payer: Self-pay

## 2020-01-17 NOTE — Telephone Encounter (Signed)
Patient takes warfarin for factor V leiden with 2 prior PEs and afib. Feb 21 had extensive acute DVT after missing a few doses of warfarin and INR became subtherapeutic.  Procedure: colonoscopy Date of procedure: 02/27/20  CrCl 50mL/min using adjusted body weight Platelet count 169K  Per office protocol, patient can hold warfarin for 5 days prior to procedure, however he will need bridging with Lovenox (enoxaparin) around procedure. Will coordinate bridge in Coumadin clinic.

## 2020-01-17 NOTE — Telephone Encounter (Signed)
Patient with h/o Factor V Leiden, chronic atrial, h/o PE x2.

## 2020-01-17 NOTE — Telephone Encounter (Signed)
° °  [facility] Pre-operative Risk Assessment    HEARTCARE STAFF: - Please ensure there is not already an duplicate clearance open for this procedure. - Under Visit Info/Reason for Call, type in Other and utilize the format Clearance MM/DD/YY or Clearance TBD. Do not use dashes or single digits. - If request is for dental extraction, please clarify the # of teeth to be extracted.  Request for surgical clearance:  1. What type of surgery is being performed? Colonoscopy   2. When is this surgery scheduled? 02/27/20   3. What type of clearance is required (medical clearance vs. Pharmacy clearance to hold med vs. Both)? Both  4. Are there any medications that need to be held prior to surgery and how long? Coumadin/Warfarin, Temporarily stop medication before and/or after procedure if polyps are removed  5. Practice name and name of physician performing surgery? [facility], Dr. Lizbeth Bark  6. What is the office phone number?  [phone]   7.   What is the office fax number?  [phone]  8.   Anesthesia type (None, local, MAC, general) ? Propofol   Jacqulynn Cadet 01/17/2020, 8:55 AM  _________________________________________________________________   (provider comments below)

## 2020-01-18 NOTE — Telephone Encounter (Signed)
   Primary Cardiologist: Kirk Ruths, MD  Chart reviewed as part of pre-operative protocol coverage. Patient was contacted 01/18/2020 in reference to pre-operative risk assessment for pending surgery as outlined below.  Stanley Wright was last seen on 09/15/19 by Kerin Ransom, PA-C for his DVT, atrial fib and factor V leiden with prior PEs.  Since that day, Stanley Wright has done well and meets 4 METs of activity without chest pain.  He does need lovenox coumadin crossover and is to be seen in our coumadin clinic 01/20/20 with instructions.    Therefore, based on ACC/AHA guidelines, the patient would be at acceptable risk for the planned procedure without further cardiovascular testing.   I will route this recommendation to the requesting party via Epic fax function and remove from pre-op pool. Please call with questions.  Cecilie Kicks, NP 01/18/2020, 9:30 AM

## 2020-01-19 ENCOUNTER — Encounter: Payer: Self-pay | Admitting: Physical Therapy

## 2020-01-20 ENCOUNTER — Ambulatory Visit (INDEPENDENT_AMBULATORY_CARE_PROVIDER_SITE_OTHER): Payer: PPO

## 2020-01-20 ENCOUNTER — Other Ambulatory Visit: Payer: Self-pay

## 2020-01-20 DIAGNOSIS — Z5181 Encounter for therapeutic drug level monitoring: Secondary | ICD-10-CM | POA: Diagnosis not present

## 2020-01-20 DIAGNOSIS — I4819 Other persistent atrial fibrillation: Secondary | ICD-10-CM

## 2020-01-20 DIAGNOSIS — D6851 Activated protein C resistance: Secondary | ICD-10-CM

## 2020-01-20 LAB — POCT INR: INR: 2.1 (ref 2.0–3.0)

## 2020-01-20 NOTE — Patient Instructions (Signed)
Description   Continue on same dosage 1 tablet daily except 1.5 tablets on Mondays, Wednesdays and Fridays. Recheck INR in 4 weeks. Call with any questions if gets any new medications or scheduled for any procedures  [phone]

## 2020-01-23 ENCOUNTER — Ambulatory Visit (INDEPENDENT_AMBULATORY_CARE_PROVIDER_SITE_OTHER): Payer: PPO | Admitting: Physician Assistant

## 2020-01-23 ENCOUNTER — Ambulatory Visit (INDEPENDENT_AMBULATORY_CARE_PROVIDER_SITE_OTHER): Payer: PPO

## 2020-01-23 ENCOUNTER — Encounter: Payer: Self-pay | Admitting: Physician Assistant

## 2020-01-23 VITALS — Ht 72.0 in | Wt 270.0 lb

## 2020-01-23 DIAGNOSIS — M25571 Pain in right ankle and joints of right foot: Secondary | ICD-10-CM | POA: Diagnosis not present

## 2020-01-23 DIAGNOSIS — G8929 Other chronic pain: Secondary | ICD-10-CM

## 2020-01-23 DIAGNOSIS — M9901 Segmental and somatic dysfunction of cervical region: Secondary | ICD-10-CM | POA: Diagnosis not present

## 2020-01-23 DIAGNOSIS — M542 Cervicalgia: Secondary | ICD-10-CM | POA: Diagnosis not present

## 2020-01-23 DIAGNOSIS — G44209 Tension-type headache, unspecified, not intractable: Secondary | ICD-10-CM | POA: Diagnosis not present

## 2020-01-23 DIAGNOSIS — M47812 Spondylosis without myelopathy or radiculopathy, cervical region: Secondary | ICD-10-CM | POA: Diagnosis not present

## 2020-01-23 NOTE — Progress Notes (Signed)
Office Visit Note   Patient: Stanley Wright           Date of Birth: 05-04-1946           MRN: [id number] Visit Date: 01/23/2020              Requested by: Maude Leriche, PA-C [place],  [place] [address] PCP: Maude Leriche, PA-C  Chief Complaint  Patient presents with  . Right Ankle - Routine Post Op    08/24/19 right ankle fusion talonavicular        HPI: Patient is 5 months status post right talonavicular and medial cuneiform arthrodesis.  He is overall doing well.  He has no concerns.  He is trying to get ready to start physical therapy.  Assessment & Plan: Visit Diagnoses:  1. Chronic pain of right ankle     Plan: Patient will work with physical therapy on gait training fall prevention and strengthening.  Follow-up with Dr. Sharol Given in 4 to 6 weeks.  Should have discussion with appropriate shoewear that might be beneficial to him  Follow-Up Instructions: No follow-ups on file.   Ortho Exam  Patient is alert, oriented, no adenopathy, well-dressed, normal affect, normal respiratory effort. Right foot well-healed surgical incision mild to moderate soft tissue swelling he is wearing a compression stocking.  Nontender to palpation good ankle range of motion no erythema no ascending cellulitis  Imaging: XR Ankle Complete Right  Result Date: 01/23/2020 X-rays of his ankle reviewed today hardware is in good position possibly some fibrous union through the fusions  No images are attached to the encounter.  Labs: Lab Results  Component Value Date   HGBA1C 6.3 11/21/2015   HGBA1C 6.2 06/12/2015   REPTSTATUS 12/24/2017 FINAL 12/23/2017   CULT  12/23/2017    NO GROWTH Performed at [facility], [place] [address], [place], [place] [address]    LABORGA ESCHERICHIA COLI 12/22/2017     Lab Results  Component Value Date   ALBUMIN 4.1 01/17/2019   ALBUMIN 2.9 (L) 12/26/2017   ALBUMIN 3.0 (L) 12/25/2017    Lab Results  Component Value  Date   MG 1.9 12/25/2017   No results found for: VD25OH  No results found for: PREALBUMIN CBC EXTENDED Latest Ref Rng & Units 08/24/2019 12/26/2017 12/25/2017  WBC 4.0 - 10.5 K/uL 4.7 4.7 4.8  RBC 4.22 - 5.81 MIL/uL 4.36 4.22 4.47  HGB 13.0 - 17.0 g/dL 14.1 13.6 14.3  HCT 39 - 52 % 43.7 40.8 43.3  PLT 150 - 400 K/uL 169 118(L) 107(L)  NEUTROABS 1.7 - 7.7 K/uL - - -  LYMPHSABS 0.7 - 4.0 K/uL - - -     Body mass index is 36.62 kg/m.  Orders:  Orders Placed This Encounter  Procedures  . XR Ankle Complete Right   No orders of the defined types were placed in this encounter.    Procedures: No procedures performed  Clinical Data: No additional findings.  ROS:  All other systems negative, except as noted in the HPI. Review of Systems  Objective: Vital Signs: Ht 6' (1.829 m)   Wt 270 lb (122.5 kg)   BMI 36.62 kg/m   Specialty Comments:  No specialty comments available.  PMFS History: Patient Active Problem List   Diagnosis Date Noted  . Primary osteoarthritis, right ankle and foot   . Lower extremity edema 08/10/2019  . Acute DVT (deep venous thrombosis) ([place]) 07/19/2019  . History of pulmonary  embolism 07/19/2019  . Parkinson's disease ([place]) 02/03/2018  . Community acquired pneumonia of left lower lobe of lung 12/23/2017  . Sepsis ([place]) 12/23/2017  . Morbid obesity due to excess calories ([place]) 08/23/2016  . OSA on CPAP 08/23/2016  . Dyspnea on exertion 08/22/2016  . Umbilical hernia 38/75/6433  . Persistent atrial fibrillation ([place])   . Encounter for therapeutic drug monitoring 08/12/2013  . Long term current use of anticoagulant 07/30/2010  . COLONIC POLYPS 06/03/2010  . Factor V Leiden ([place]) 06/03/2010  . GERD 06/03/2010  . HIATAL HERNIA 06/03/2010  . BENIGN PROSTATIC HYPERTROPHY, WITH OBSTRUCTION 06/03/2010  . PSORIASIS 06/03/2010   Past Medical History:  Diagnosis Date  . Arthritis   . BENIGN PROSTATIC HYPERTROPHY, WITH OBSTRUCTION   . Cancer  (HCC)    skin, basal, squamous  . COLONIC POLYPS   . Diverticulitis   . DVT (deep venous thrombosis) ([place]) 2000  . Dysrhythmia    Hx Afib- 2017  . Early cataract   . Factor 5 Leiden mutation, heterozygous ([place])   . GERD (gastroesophageal reflux disease)    not on medication  . Hearing aid worn    B/L  . HIATAL HERNIA   . ITP (idiopathic thrombocytopenic purpura) 2003  . Long term current use of anticoagulant   . Osteoarthritis    right foot  . Parkinson's disease ([place]) 02/03/2018  . PSORIASIS   . Pulmonary embolus ([place]) 2000  . Shortness of breath dyspnea    at times - Talking alot  . Sleep apnea   . Wears glasses     Family History  Problem Relation Age of Onset  . Cancer Father   . Breast cancer Sister   . Heart disease Brother   . Cancer Maternal Grandmother   . Stroke Paternal Grandfather     Past Surgical History:  Procedure Laterality Date  . ANKLE FUSION Right 08/24/2019   Procedure: RIGHT TALO-NAVICULAR FUSION;  Surgeon: Newt Minion, MD;  Location: [facility];  Service: Orthopedics;  Laterality: Right;  . CARDIOVERSION N/A 11/22/2015   Procedure: CARDIOVERSION;  Surgeon: Sanda Klein, MD;  [facility];  Service: Cardiovascular;  Laterality: N/A;  . COLONOSCOPY    . HERNIA REPAIR Left    Inguinal- x2 . mesh x1  . INSERTION OF MESH N/A 02/25/2016   Procedure: INSERTION OF MESH;  Surgeon: Rolm Bookbinder, MD;  Location: [facility];  Service: General;  Laterality: N/A;  . LUNG REMOVAL, PARTIAL Right 2004   was fungal and not cancerous  . PROSTATE SURGERY    . UMBILICAL HERNIA REPAIR N/A 02/25/2016   Procedure: LAPAROSCOPIC UMBILICAL HERNIA REPAIR;  Surgeon: Rolm Bookbinder, MD;  Location: [facility];  Service: General;  Laterality: N/A;   Social History   Occupational History    Employer: RETIRED    Comment: Engineer, structural  Tobacco Use  . Smoking status: Former Smoker    Packs/day: 1.00    Quit date: 06/16/1985    Years since quitting: 34.6  . Smokeless  tobacco: Never Used  . Tobacco comment: quit approx age 44-   Vaping Use  . Vaping Use: Never used  Substance and Sexual Activity  . Alcohol use: No  . Drug use: No  . Sexual activity: Yes

## 2020-01-27 ENCOUNTER — Ambulatory Visit: Payer: PPO | Attending: Neurology | Admitting: Physical Therapy

## 2020-01-27 ENCOUNTER — Other Ambulatory Visit: Payer: Self-pay

## 2020-01-27 DIAGNOSIS — M25672 Stiffness of left ankle, not elsewhere classified: Secondary | ICD-10-CM | POA: Diagnosis not present

## 2020-01-27 DIAGNOSIS — R2681 Unsteadiness on feet: Secondary | ICD-10-CM

## 2020-01-27 DIAGNOSIS — M25671 Stiffness of right ankle, not elsewhere classified: Secondary | ICD-10-CM

## 2020-01-27 DIAGNOSIS — M6281 Muscle weakness (generalized): Secondary | ICD-10-CM

## 2020-01-27 DIAGNOSIS — R2689 Other abnormalities of gait and mobility: Secondary | ICD-10-CM

## 2020-01-27 NOTE — Therapy (Signed)
[facility] [address] [place], [place], [address] Phone: [phone]   Fax:  [phone]  Physical Therapy Evaluation  Patient Details  Name: Stanley Wright MRN: [id number] Date of Birth: [birth year] Referring Provider (PT): Audrea Muscat Persons, PA-C   Encounter Date: 01/27/2020   PT End of Session - 01/27/20 1345    Visit Number 1    Number of Visits 17    Date for PT Re-Evaluation 24/58/09   90 day cert from 9 wk POC   Authorization Type [employer]    PT Start Time 0801    PT Stop Time 0848    PT Time Calculation (min) 47 min    Activity Tolerance Patient tolerated treatment well;No increased pain    Behavior During Therapy WFL for tasks assessed/performed           Past Medical History:  Diagnosis Date  . Arthritis   . BENIGN PROSTATIC HYPERTROPHY, WITH OBSTRUCTION   . Cancer (HCC)    skin, basal, squamous  . COLONIC POLYPS   . Diverticulitis   . DVT (deep venous thrombosis) ([facility]) 2000  . Dysrhythmia    Hx Afib- 2017  . Early cataract   . Factor 5 Leiden mutation, heterozygous ([facility])   . GERD (gastroesophageal reflux disease)    not on medication  . Hearing aid worn    B/L  . HIATAL HERNIA   . ITP (idiopathic thrombocytopenic purpura) 2003  . Long term current use of anticoagulant   . Osteoarthritis    right foot  . Parkinson's disease ([facility]) 02/03/2018  . PSORIASIS   . Pulmonary embolus ([facility]) 2000  . Shortness of breath dyspnea    at times - Talking alot  . Sleep apnea   . Wears glasses     Past Surgical History:  Procedure Laterality Date  . ANKLE FUSION Right 08/24/2019   Procedure: RIGHT TALO-NAVICULAR FUSION;  Surgeon: Newt Minion, MD;  Location: [facility];  Service: Orthopedics;  Laterality: Right;  . CARDIOVERSION N/A 11/22/2015   Procedure: CARDIOVERSION;  Surgeon: Sanda Klein, MD;  Location: [facility];  Service: Cardiovascular;  Laterality: N/A;  . COLONOSCOPY    .  HERNIA REPAIR Left    Inguinal- x2 . mesh x1  . INSERTION OF MESH N/A 02/25/2016   Procedure: INSERTION OF MESH;  Surgeon: Rolm Bookbinder, MD;  Location: [facility];  Service: General;  Laterality: N/A;  . LUNG REMOVAL, PARTIAL Right 2004   was fungal and not cancerous  . PROSTATE SURGERY    . UMBILICAL HERNIA REPAIR N/A 02/25/2016   Procedure: LAPAROSCOPIC UMBILICAL HERNIA REPAIR;  Surgeon: Rolm Bookbinder, MD;  Location: [facility];  Service: General;  Laterality: N/A;    There were no vitals filed for this visit.    Subjective Assessment - 01/27/20 0805    Subjective Pt had R ankle fusion of talonavicular/medical cuneiform arthodesis.  Was in a w/c until it healed, and I still use it in the house.  Have a U-step walker as well.  I feel like I need therapy to know how to walk again.  No falls in past 6 months    Pertinent History R ankle fusion talonavicular/medial cuneiform arthrodesis, DVT/PE 07/2019, pneumonia, sepsis    Limitations Walking    Patient Stated Goals Pt's goals for therapy are to learn how to walk again.    Currently in Pain? No/denies   Pain in R foot comes on at certain times  [facility] PT Assessment - 01/27/20 0809      Assessment   Medical Diagnosis R ankle pain s/p fusion    Referring Provider (PT) Audrea Muscat Persons, PA-C    Onset Date/Surgical Date 08/24/19   surgery   Hand Dominance Right      Precautions   Precautions Fall    Precaution Comments No specific precautions/restrictions per pt report      Restrictions   Weight Bearing Restrictions No      Balance Screen   Has the patient fallen in the past 6 months No    Has the patient had a decrease in activity level because of a fear of falling?  Yes    Is the patient reluctant to leave their home because of a fear of falling?  Yes      [place] Private residence    Living Arrangements Alone    Type of [place] entrance   with [place] One level    [place] - single point;Walker - 2 wheels;Wheelchair - manual   walking pole; U-step rollator     Prior Function   Level of [place] Enjoys yard work, church, would like to try [employer]      Observation/Other Assessments   Observations RLE edema noted, pitting (Pt on Lasix)    Focus on Therapeutic Outcomes (FOTO)  NA      Sensation   Light Touch Appears Intact      Posture/Postural Control   Posture/Postural Control Postural limitations    Postural Limitations Rounded Shoulders;Forward head    Posture Comments Pt's great toe on RLE does go up into hyperextension at times with ankle dorsiflexion movements-pt reports this is dystonia related.      ROM / Strength   AROM / PROM / Strength AROM;PROM;Strength      AROM   Overall AROM  Deficits    AROM Assessment Site Ankle    Right/Left Ankle Right;Left    Right Ankle Dorsiflexion -5   from neutral   Right Ankle Plantar Flexion 18    Left Ankle Dorsiflexion 4    Left Ankle Plantar Flexion 25      PROM   Overall PROM  Deficits    PROM Assessment Site Ankle    Right/Left Ankle Right;Left    Right Ankle Dorsiflexion 11    Right Ankle Plantar Flexion 40    Right Ankle Inversion 16    Right Ankle Eversion 8      Strength   Overall Strength Deficits    Strength Assessment Site Hip;Knee;Ankle    Right/Left Hip Right;Left    Right Hip Flexion 4+/5    Left Hip Flexion 4+/5    Right/Left Knee Right;Left    Right Knee Flexion 5/5    Right Knee Extension 5/5    Left Knee Flexion 5/5    Left Knee Extension 5/5    Right/Left Ankle Right;Left    Right Ankle Dorsiflexion 3-/5    Right Ankle Plantar Flexion --   not formally tested, but decr strength with foot slap   Right Ankle Inversion 3-/5    Right Ankle Eversion 3-/5    Left Ankle Dorsiflexion 3+/5      Transfers   Transfers Sit to Stand;Stand to Sit    Sit to Stand 6: Modified independent (Device/Increase  time);With upper extremity assist;From chair/3-in-1    Five  time sit to stand comments  10.81    Stand to Sit 6: Modified independent (Device/Increase time);With upper extremity assist;To chair/3-in-1      Ambulation/Gait   Ambulation/Gait Yes    Ambulation/Gait Assistance 5: Supervision    Ambulation/Gait Assistance Details Initial freezing of gait with initiation of TUG and with turns    Ambulation Distance (Feet) 80 Feet   x 2   Assistive device --   single walking pole (pt holds it/swings it at times)   Gait Pattern Step-through pattern;Decreased arm swing - right;Decreased arm swing - left;Decreased step length - right;Decreased step length - left;Decreased stance time - right;Decreased dorsiflexion - right;Decreased dorsiflexion - left;Right foot flat;Festinating   decreased heelstrike   Ambulation Surface Level;Indoor    Gait velocity 15.37 sec = 2.13 ft/sec   indicates limited community ambulator     Standardized Balance Assessment   Standardized Balance Assessment Timed Up and Go Test      Timed Up and Go Test   Normal TUG (seconds) 14.25    Manual TUG (seconds) 15.13    Cognitive TUG (seconds) 17.21    TUG Comments Scores >13.5-15 seocnds indicate increased fall risk                      Objective measurements completed on examination: See above findings.             Therapeutic Exercise Seated Ankle Dorsiflexion AROM - 1-2 x daily - 5 x weekly - 1-2 sets - 10 reps - 3 sec hold Seated Heel Raise - 1-2 x daily - 5 x weekly - 1-2 sets - 10 reps - 3 sec hold Seated Ankle Eversion AROM - 1-2 x daily - 5 x weekly - 1-2 sets - 10 reps - 3 sec hold  Verbal instructions/demo provided for rolling right foot forward/back with pool noodle/water bottle under foot    PT Education - 01/27/20 1344    Education Details POC and eval results; initiated HEP-see instructions    Person(s) Educated Patient    Methods Explanation;Demonstration;Verbal cues;Handout     Comprehension Verbalized understanding;Returned demonstration            PT Short Term Goals - 01/27/20 1447      PT SHORT TERM GOAL #1   Title Pt will be independent with HEP for improved flexibility, strength, balance, transfers, and gait.  TARGET 02/24/2020    Time 5    Period Weeks    Status New      PT SHORT TERM GOAL #2   Title Pt will demonstrate improved flexibility in R ankle dorsiflexion by at least 5 degrees and L ankle dorsiflexion by at least 3 degrees for improved flexibility for improved gait and decreased pain.    Baseline A/ROM R dorsiflexion -5 degrees, L 4 degrees    Time 5    Period Weeks    Status New      PT SHORT TERM GOAL #3   Title Pt will improve TUG score to less than or equal to 13.5 seconds for improved gait efficiency and decreased fall risk.    Baseline 14.25 sec, festination with initiation of gait    Time 5    Period Weeks    Status New      PT SHORT TERM GOAL #4   Title Pt will improve giat velocity to at least 2.3 ft/sec for improved gait efficiency and safety, with appropriate assistive device.    Time 5  Period Weeks    Status New             PT Long Term Goals - 01/27/20 1454      PT LONG TERM GOAL #1   Title Pt will be independent with progression of HEP for improved flexibility, strength, balance, transfers, and gait.  TARGET 03/23/2020    Time 9    Period Weeks    Status New      PT LONG TERM GOAL #2   Title Pt will rate no more than 2 point increase in pain from baseline, with gait > 5 minutes in duration for improved overall functional mobility.    Baseline increased pain with prolonged walking and standing    Time 9    Period Weeks    Status New      PT LONG TERM GOAL #3   Title Pt will improve TUG cognitive score to less than or equal to 15 seconds for decreased fall risk.    Time 9    Period Weeks    Status New      PT LONG TERM GOAL #4   Title Pt will improve gait velocity to at least 2.62 ft/sec for improved  gait efficiency and safety.    Time 9    Period Weeks    Status New      PT LONG TERM GOAL #5   Title Pt will demonstrate improved R ankle strength to at least 3+/5 for improved balance to prevent falls.    Time 9    Period Weeks    Status New                  Plan - 01/27/20 1345    Clinical Impression Statement Pt is a 75 year old male who presents to OPPT with history of Parkinson's disease known to this clinic from previous bouts of therapy.  In March 2021, he underwent R ankle fusion of talonavicular joint, with medial cuneiform arthrodesis.  Following surgery, he had weightbearing restrictions and was primarily using w/c as means of mobility in home.  He presents to OPPT today following recommendation to return to PT from [place], PA-C for gait training and strengthening.  He presents with decreased flexibility/ROM in R ankle joint, decreased flexibility in L ankle as well; also, he presents with decreased strength, decreased balance, decreased timing and coordination of gait.  He is at risk for falls per TUG/TUG cognitive scores and is a limited community ambulator.  He would benefit from skilled PT to address the above stated deficits to decrease fall risk and improve overall functional mobility.    Personal Factors and Comorbidities Comorbidity 3+    Comorbidities 08/24/2019  R ankle fusion talonavicular/medial cuneiform arthrodesis, DVT/PE 07/2019, pneumonia, sepsis    Examination-Activity Limitations Locomotion Level;Transfers;Stand;Carry    Examination-Participation Restrictions Church;Community Activity;Other   Community exercise   Stability/Clinical Decision Making Evolving/Moderate complexity    Clinical Decision Making Moderate    Rehab Potential Good    PT Frequency 2x / week    PT Duration Other (comment)   8 weeks plus eval visit = 9 weeks   PT Treatment/Interventions ADLs/Self Care Home Management;DME Instruction;Moist Heat;Gait training;Functional mobility  training;Therapeutic activities;Therapeutic exercise;Balance training;Neuromuscular re-education;Manual techniques;Patient/family education;Passive range of motion    PT Next Visit Plan Review initial exercises as part of HEP; gentle stretch for heelcords/gastroc; progress ankle stregthening with resisted bands; gait training for improved heelstrike, step length    Recommended Other Services (Pt  is inquiring about [employer] upon d/c from PT)    Consulted and Agree with Plan of Care Patient           Patient will benefit from skilled therapeutic intervention in order to improve the following deficits and impairments:  Abnormal gait, Decreased range of motion, Difficulty walking, Impaired tone, Decreased activity tolerance, Pain, Decreased balance, Impaired flexibility, Decreased mobility, Decreased strength  Visit Diagnosis: Stiffness of right ankle, not elsewhere classified  Other abnormalities of gait and mobility  Stiffness of left ankle, not elsewhere classified  Muscle weakness (generalized)  Unsteadiness on feet     Problem List Patient Active Problem List   Diagnosis Date Noted  . Primary osteoarthritis, right ankle and foot   . Lower extremity edema 08/10/2019  . Acute DVT (deep venous thrombosis) ([place]) 07/19/2019  . History of pulmonary embolism 07/19/2019  . Parkinson's disease ([place]) 02/03/2018  . Community acquired pneumonia of left lower lobe of lung 12/23/2017  . Sepsis ([place]) 12/23/2017  . Morbid obesity due to excess calories ([place]) 08/23/2016  . OSA on CPAP 08/23/2016  . Dyspnea on exertion 08/22/2016  . Umbilical hernia 24/23/5361  . Persistent atrial fibrillation ([place])   . Encounter for therapeutic drug monitoring 08/12/2013  . Long term current use of anticoagulant 07/30/2010  . COLONIC POLYPS 06/03/2010  . Factor V Leiden ([place]) 06/03/2010  . GERD 06/03/2010  . HIATAL HERNIA 06/03/2010  . BENIGN PROSTATIC HYPERTROPHY, WITH OBSTRUCTION 06/03/2010    . PSORIASIS 06/03/2010    Shammara Jarrett W. 01/27/2020, 3:00 PM Frazier Butt., PT  [facility] [address] [place], [place], [address] Phone: [phone]   Fax:  [phone]  Name: Stanley Wright MRN: [id number] Date of Birth: 1946-01-07

## 2020-01-27 NOTE — Patient Instructions (Addendum)
Access Code: [email or web address] URL: [email or web address] Date: 01/27/2020 Prepared by: Mady Haagensen  Exercises Seated Ankle Dorsiflexion AROM - 1-2 x daily - 5 x weekly - 1-2 sets - 10 reps - 3 sec hold Seated Heel Raise - 1-2 x daily - 5 x weekly - 1-2 sets - 10 reps - 3 sec hold Seated Ankle Eversion AROM - 1-2 x daily - 5 x weekly - 1-2 sets - 10 reps - 3 sec hold  Verbal instructions/demo provided for rolling right foot forward/back with pool noodle/water bottle under foot

## 2020-01-30 ENCOUNTER — Ambulatory Visit: Payer: PPO | Admitting: Physical Therapy

## 2020-02-03 ENCOUNTER — Ambulatory Visit: Payer: PPO | Admitting: Physical Therapy

## 2020-02-03 ENCOUNTER — Other Ambulatory Visit: Payer: Self-pay

## 2020-02-03 DIAGNOSIS — R2689 Other abnormalities of gait and mobility: Secondary | ICD-10-CM

## 2020-02-03 DIAGNOSIS — M25671 Stiffness of right ankle, not elsewhere classified: Secondary | ICD-10-CM | POA: Diagnosis not present

## 2020-02-03 DIAGNOSIS — M6281 Muscle weakness (generalized): Secondary | ICD-10-CM

## 2020-02-03 NOTE — Therapy (Addendum)
[facility] [address] [place], [place], [address] Phone: [phone]   Fax:  [phone]  Physical Therapy Treatment  Patient Details  Name: Stanley Wright MRN: [id number] Date of Birth: [birth year] Referring Provider (PT): Audrea Muscat Persons, PA-C   Encounter Date: 02/03/2020   PT End of Session - 02/03/20 1229    Visit Number 2    Number of Visits 17    Date for PT Re-Evaluation 40/81/44   90 day cert from 9 wk POC   Authorization Type [employer]    PT Start Time 0756    PT Stop Time 0843    PT Time Calculation (min) 47 min    Activity Tolerance Patient tolerated treatment well   Reports pain 3-4/10 at end of session   Behavior During Therapy [facility] for tasks assessed/performed           Past Medical History:  Diagnosis Date  . Arthritis   . BENIGN PROSTATIC HYPERTROPHY, WITH OBSTRUCTION   . Cancer (HCC)    skin, basal, squamous  . COLONIC POLYPS   . Diverticulitis   . DVT (deep venous thrombosis) ([place]) 2000  . Dysrhythmia    Hx Afib- 2017  . Early cataract   . Factor 5 Leiden mutation, heterozygous ([place])   . GERD (gastroesophageal reflux disease)    not on medication  . Hearing aid worn    B/L  . HIATAL HERNIA   . ITP (idiopathic thrombocytopenic purpura) 2003  . Long term current use of anticoagulant   . Osteoarthritis    right foot  . Parkinson's disease ([place]) 02/03/2018  . PSORIASIS   . Pulmonary embolus ([place]) 2000  . Shortness of breath dyspnea    at times - Talking alot  . Sleep apnea   . Wears glasses     Past Surgical History:  Procedure Laterality Date  . ANKLE FUSION Right 08/24/2019   Procedure: RIGHT TALO-NAVICULAR FUSION;  Surgeon: Newt Minion, MD;  Location: [facility];  Service: Orthopedics;  Laterality: Right;  . CARDIOVERSION N/A 11/22/2015   Procedure: CARDIOVERSION;  Surgeon: Sanda Klein, MD;  [facility];  Service: Cardiovascular;  Laterality: N/A;  .  COLONOSCOPY    . HERNIA REPAIR Left    Inguinal- x2 . mesh x1  . INSERTION OF MESH N/A 02/25/2016   Procedure: INSERTION OF MESH;  Surgeon: Rolm Bookbinder, MD;  Location: [facility];  Service: General;  Laterality: N/A;  . LUNG REMOVAL, PARTIAL Right 2004   was fungal and not cancerous  . PROSTATE SURGERY    . UMBILICAL HERNIA REPAIR N/A 02/25/2016   Procedure: LAPAROSCOPIC UMBILICAL HERNIA REPAIR;  Surgeon: Rolm Bookbinder, MD;  Location: [facility];  Service: General;  Laterality: N/A;    There were no vitals filed for this visit.   Subjective Assessment - 02/03/20 0800    Subjective Woke up on Saturday morning, and both my feet were bothering me; could hardly walk.  Worked on my foot exercises and my foot is tender.    Pertinent History R ankle fusion talonavicular/medial cuneiform arthrodesis, DVT/PE 07/2019, pneumonia, sepsis    Limitations Walking    Patient Stated Goals Pt's goals for therapy are to learn how to walk again.    Currently in Pain? Yes    Pain Score 2     Pain Location Ankle    Pain Orientation Left;Lateral    Pain Descriptors / Indicators Tender    Pain Type Acute pain  Pain Onset In the past 7 days    Pain Frequency Intermittent    Aggravating Factors  ?exercises    Pain Relieving Factors unsure                             [facility] Adult PT Treatment/Exercise - 02/03/20 0804      Ambulation/Gait   Ambulation/Gait Yes    Ambulation/Gait Assistance 5: Supervision    Ambulation Distance (Feet) 250 Feet   U-step RW, then 100 ft with walking pole   Assistive device Other (Comment)   walking pole; u-step RW   Gait Pattern Step-through pattern;Decreased arm swing - right;Decreased arm swing - left;Decreased step length - right;Decreased step length - left;Decreased stance time - right;Decreased dorsiflexion - right;Decreased dorsiflexion - left;Right foot flat;Festinating    Ambulation Surface Level;Indoor    Pre-Gait Activities Standing wide BOS  lateral weightshift x 5 reps, 2 sets; then stagger stance anterior/posterior weigthshfiting x 10 reps each foot position.  Pt more cautious with less weightbearing through RLE.    Gait Comments Cues for deliberate, slowed pace for improved step length, heelstrike bilaterally.  Recommended pt use his U-step RW at home, along hallway, 1-2 times per day to concentrate on step length, heelstrike, improved gait mechanics.      Exercises   Exercises Knee/Hip;Ankle      Knee/Hip Exercises: Stretches   Active Hamstring Stretch Right;Left;3 reps;30 seconds    Active Hamstring Stretch Limitations foot propped on 4" block    Gastroc Stretch Right;Left;3 reps;10 seconds    Gastroc Stretch Limitations using belt    Other Knee/Hip Stretches Seated heelslides and hold at end range for gastroc stretch, 5 reps x 10 seconds, bilaterally    Other Knee/Hip Stretches PT performs passive ROM heelcord stretch RLE, for increased dorsiflexion, 5 reps with 10 second hold.  Performed contract/relax x 3 additional reps for improved dorsiflexion.  RANGE OF MOTION dorsiflexion measured after exercises:  9 degrees ankle dorsiflexion (started active, then completed passively)          Reviewed HEP from last visit, with pt return demo understanding:    Seated Ankle Dorsiflexion AROM - 1-2 x daily - 5 x weekly - 1-2 sets - 10 reps - 3 sec hold Seated Heel Raise - 1-2 x daily - 5 x weekly - 1-2 sets - 10 reps - 3 sec hold Seated Ankle Eversion AROM - 1-2 x daily - 5 x weekly - 1-2 sets - 10 reps - 3 sec hold       Educated patient to not overdo exercises; to do as frequency states, not more than 1-2 times per day.  Also, educated pt in use of ice after exercise/therapy sessions to reduce soreness.   PT Education - 02/03/20 1228    Education Details Additions to [employer]) Educated Patient    Methods Explanation;Demonstration;Handout    Comprehension Verbalized understanding;Returned demonstration   Cues to  only perform at recommended frequency, to no overdo it           PT Short Term Goals - 01/27/20 1447      PT SHORT TERM GOAL #1   Title Pt will be independent with HEP for improved flexibility, strength, balance, transfers, and gait.  TARGET 02/24/2020    Time 5    Period Weeks    Status New      PT SHORT TERM GOAL #2   Title Pt will  demonstrate improved flexibility in R ankle dorsiflexion by at least 5 degrees and L ankle dorsiflexion by at least 3 degrees for improved flexibility for improved gait and decreased pain.    Baseline A/ROM R dorsiflexion -5 degrees, L 4 degrees    Time 5    Period Weeks    Status New      PT SHORT TERM GOAL #3   Title Pt will improve TUG score to less than or equal to 13.5 seconds for improved gait efficiency and decreased fall risk.    Baseline 14.25 sec, festination with initiation of gait    Time 5    Period Weeks    Status New      PT SHORT TERM GOAL #4   Title Pt will improve giat velocity to at least 2.3 ft/sec for improved gait efficiency and safety, with appropriate assistive device.    Time 5    Period Weeks    Status New             PT Long Term Goals - 01/27/20 1454      PT LONG TERM GOAL #1   Title Pt will be independent with progression of HEP for improved flexibility, strength, balance, transfers, and gait.  TARGET 03/23/2020    Time 9    Period Weeks    Status New      PT LONG TERM GOAL #2   Title Pt will rate no more than 2 point increase in pain from baseline, with gait > 5 minutes in duration for improved overall functional mobility.    Baseline increased pain with prolonged walking and standing    Time 9    Period Weeks    Status New      PT LONG TERM GOAL #3   Title Pt will improve TUG cognitive score to less than or equal to 15 seconds for decreased fall risk.    Time 9    Period Weeks    Status New      PT LONG TERM GOAL #4   Title Pt will improve gait velocity to at least 2.62 ft/sec for improved gait  efficiency and safety.    Time 9    Period Weeks    Status New      PT LONG TERM GOAL #5   Title Pt will demonstrate improved R ankle strength to at least 3+/5 for improved balance to prevent falls.    Time 9    Period Weeks    Status New                 Plan - 02/03/20 1409    Clinical Impression Statement Skilled PT session today focused on range of motion, stretching, flexibility with R ankle; with passive stretching and active ROM, pt reports feeling better and less stiff.  Additional standing work on weightshifting side to side and anterior/posterior directions in stagger stance, followed by gait with U-step RW for improved gait mechanics with BUE supported.  At end of session, pt noted pain 3-4/10, but feels this is just because we have worked it.  Pt will continue to benefit from skilled PT to further address flexibility, strength, gait training and balance for overall improved functional mobility.    Personal Factors and Comorbidities Comorbidity 3+    Comorbidities 08/24/2019  R ankle fusion talonavicular/medial cuneiform arthrodesis, DVT/PE 07/2019, pneumonia, sepsis    Examination-Activity Limitations Locomotion Level;Transfers;Stand;Carry    Examination-Participation Restrictions Church;Community Activity;Other   Community exercise   Stability/Clinical  Decision Making Evolving/Moderate complexity    Rehab Potential Good    PT Frequency 2x / week    PT Duration Other (comment)   8 weeks plus eval visit = 9 weeks   PT Treatment/Interventions ADLs/Self Care Home Management;DME Instruction;Moist Heat;Gait training;Functional mobility training;Therapeutic activities;Therapeutic exercise;Balance training;Neuromuscular re-education;Manual techniques;Patient/family education;Passive range of motion    PT Next Visit Plan Review additions to HEP; gentle stretch for heelcords/gastroc; progress ankle stregthening with resisted bands; gait training for improved heelstrike, step length;  how was he able to do with gait with U-step RW at home?    Consulted and Agree with Plan of Care Patient           Patient will benefit from skilled therapeutic intervention in order to improve the following deficits and impairments:  Abnormal gait, Decreased range of motion, Difficulty walking, Impaired tone, Decreased activity tolerance, Pain, Decreased balance, Impaired flexibility, Decreased mobility, Decreased strength  Visit Diagnosis: Stiffness of right ankle, not elsewhere classified  Muscle weakness (generalized)  Other abnormalities of gait and mobility     Problem List Patient Active Problem List   Diagnosis Date Noted  . Primary osteoarthritis, right ankle and foot   . Lower extremity edema 08/10/2019  . Acute DVT (deep venous thrombosis) ([place]) 07/19/2019  . History of pulmonary embolism 07/19/2019  . Parkinson's disease ([place]) 02/03/2018  . Community acquired pneumonia of left lower lobe of lung 12/23/2017  . Sepsis ([place]) 12/23/2017  . Morbid obesity due to excess calories ([place]) 08/23/2016  . OSA on CPAP 08/23/2016  . Dyspnea on exertion 08/22/2016  . Umbilical hernia 73/41/9379  . Persistent atrial fibrillation ([place])   . Encounter for therapeutic drug monitoring 08/12/2013  . Long term current use of anticoagulant 07/30/2010  . COLONIC POLYPS 06/03/2010  . Factor V Leiden ([place]) 06/03/2010  . GERD 06/03/2010  . HIATAL HERNIA 06/03/2010  . BENIGN PROSTATIC HYPERTROPHY, WITH OBSTRUCTION 06/03/2010  . PSORIASIS 06/03/2010    Zaliyah Meikle W. 02/03/2020, 2:13 PM  Frazier Butt., PT   [facility] [address] [place], [place], [address] Phone: [phone]   Fax:  [phone]  Name: Stanley Wright MRN: [id number] Date of Birth: [birth year]

## 2020-02-03 NOTE — Patient Instructions (Addendum)
Provided handwritten stretches (as could not access [employer] today)  -Seated hamstring stretch, foot propped on stool or on floor, 30 seconds, 3 reps each leg.  Perform 1-2 x/day  -Seated heelcord stretch, keeping heel on ground and holding at end range, 10 seconds, 3-5 reps.  Perform 1-2 x/day   Also educated pt to walk in hallway at home using U-step RW, 1-2 laps several times a day with focus on step length, foot clearance, heelstrike

## 2020-02-06 ENCOUNTER — Other Ambulatory Visit: Payer: Self-pay

## 2020-02-06 ENCOUNTER — Ambulatory Visit: Payer: PPO | Admitting: Physical Therapy

## 2020-02-06 DIAGNOSIS — M6281 Muscle weakness (generalized): Secondary | ICD-10-CM

## 2020-02-06 DIAGNOSIS — M25671 Stiffness of right ankle, not elsewhere classified: Secondary | ICD-10-CM

## 2020-02-06 DIAGNOSIS — R2681 Unsteadiness on feet: Secondary | ICD-10-CM

## 2020-02-06 DIAGNOSIS — R2689 Other abnormalities of gait and mobility: Secondary | ICD-10-CM

## 2020-02-06 NOTE — Therapy (Signed)
[facility] [address] [place], [place], [address] Phone: [phone]   Fax:  [phone]  Physical Therapy Treatment  Patient Details  Name: Stanley Wright MRN: [id number] Date of Birth: 05/16/1946 Referring Provider (PT): Audrea Muscat Persons, PA-C   Encounter Date: 02/06/2020   PT End of Session - 02/06/20 0720    Visit Number 3    Number of Visits 17    Date for PT Re-Evaluation 56/43/32   90 day cert from 9 wk POC   Authorization Type [employer]    PT Start Time 0717    PT Stop Time 0759    PT Time Calculation (min) 42 min    Activity Tolerance Patient tolerated treatment well   Pt rates pain no worse at end of session   Behavior During Therapy [facility] for tasks assessed/performed           Past Medical History:  Diagnosis Date  . Arthritis   . BENIGN PROSTATIC HYPERTROPHY, WITH OBSTRUCTION   . Cancer (HCC)    skin, basal, squamous  . COLONIC POLYPS   . Diverticulitis   . DVT (deep venous thrombosis) ([place]) 2000  . Dysrhythmia    Hx Afib- 2017  . Early cataract   . Factor 5 Leiden mutation, heterozygous ([place])   . GERD (gastroesophageal reflux disease)    not on medication  . Hearing aid worn    B/L  . HIATAL HERNIA   . ITP (idiopathic thrombocytopenic purpura) 2003  . Long term current use of anticoagulant   . Osteoarthritis    right foot  . Parkinson's disease ([place]) 02/03/2018  . PSORIASIS   . Pulmonary embolus ([place]) 2000  . Shortness of breath dyspnea    at times - Talking alot  . Sleep apnea   . Wears glasses     Past Surgical History:  Procedure Laterality Date  . ANKLE FUSION Right 08/24/2019   Procedure: RIGHT TALO-NAVICULAR FUSION;  Surgeon: Newt Minion, MD;  Location: [facility];  Service: Orthopedics;  Laterality: Right;  . CARDIOVERSION N/A 11/22/2015   Procedure: CARDIOVERSION;  Surgeon: Sanda Klein, MD;  [facility];  Service: Cardiovascular;  Laterality: N/A;  .  COLONOSCOPY    . HERNIA REPAIR Left    Inguinal- x2 . mesh x1  . INSERTION OF MESH N/A 02/25/2016   Procedure: INSERTION OF MESH;  Surgeon: Rolm Bookbinder, MD;  Location: [facility];  Service: General;  Laterality: N/A;  . LUNG REMOVAL, PARTIAL Right 2004   was fungal and not cancerous  . PROSTATE SURGERY    . UMBILICAL HERNIA REPAIR N/A 02/25/2016   Procedure: LAPAROSCOPIC UMBILICAL HERNIA REPAIR;  Surgeon: Rolm Bookbinder, MD;  Location: [facility];  Service: General;  Laterality: N/A;    There were no vitals filed for this visit.   Subjective Assessment - 02/06/20 0718    Subjective Did lots of walking and did yard work/mowing, but I did very few exercises.  Just sore in my R foot.  Did walk up and down the hallway in house with Ustep.    Pertinent History R ankle fusion talonavicular/medial cuneiform arthrodesis, DVT/PE 07/2019, pneumonia, sepsis    Limitations Walking    Patient Stated Goals Pt's goals for therapy are to learn how to walk again.    Currently in Pain? Yes    Pain Score 5     Pain Location Ankle    Pain Orientation Right;Lateral    Pain Descriptors / Indicators Sore  Pain Type Acute pain    Pain Onset In the past 7 days    Aggravating Factors  lot of walking this weekend    Pain Relieving Factors unsure                             [facility] Adult PT Treatment/Exercise - 02/06/20 0001      Ambulation/Gait   Ambulation/Gait Yes    Ambulation/Gait Assistance 5: Supervision    Ambulation/Gait Assistance Details Pt carries his walking pole.  PT cues pt to try to use consistently, however, pt carries most of the time.  Trialed use of SPC x 80 ft in LUE , with cues for sequence with RLE, to help with taking some weight off RLE and for improved consistent step length.  Pt reports "it's no different, just one more thing to keep track of.    Ambulation Distance (Feet) 230 Feet   80 ft x 2   Assistive device Other (Comment)   pt's single walking pole; trial of  SPC   Gait Pattern Step-through pattern;Decreased arm swing - right;Decreased arm swing - left;Decreased step length - right;Decreased step length - left;Decreased stance time - right;Decreased dorsiflexion - right;Decreased dorsiflexion - left;Right foot flat;Festinating    Ambulation Surface Level;Indoor    Gait Comments Cues for deliberate, equal/even step length.  With festination episodes, cued patient to stop and reset.      High Level Balance   High Level Balance Activities Backward walking;Figure 8 turns   Forward/back along counter 3 reps   High Level Balance Comments Sidestep together R<>L, 15 reps; figure-8 turns x 2 reps each direction for wider base turning.  Standing at counter:  sidestep out and in (single leg) x 5 reps, then sidestep-together out and in (90 degree turn practice), x 5 reps each direction.  At counter, wide BOS lateral weightshifting x 10 reps, then stagger stance forward/back rocking (cues for technique and weigthshift) x 10 reps each.      Ankle Exercises: Seated   Towel Inversion/Eversion 3 reps   bilat, cues for slowed pace on R   Other Seated Ankle Exercises Using large green noodle roll-active assisted dorsiflexion, plantarflexion 5 reps each leg    Other Seated Ankle Exercises Passive ROM ankle plantarflexion/dorsiflexion, with hold at end ranges, 10 reps; then passive ankle inversion/eversion with hold at end range x 10 reps           Reviewed HEP given last visit: Pt return demo understanding, but does report not having done them at home.   Seated hamstring stretch, foot propped on stool or on floor, 30 seconds, 3 reps each leg.  Perform 1-2 x/day   -Seated heelcord stretch, keeping heel on ground and holding at end range, 10 seconds, 3-5 reps.  Perform 1-2 x/day        PT Education - 02/06/20 0805    Education Details Use of ice after exercises (10-15 minutes, 1-2x/day); importance of performing exercises consistently    Person(s) Educated  Patient    Methods Explanation    Comprehension Verbalized understanding            PT Short Term Goals - 01/27/20 1447      PT SHORT TERM GOAL #1   Title Pt will be independent with HEP for improved flexibility, strength, balance, transfers, and gait.  TARGET 02/24/2020    Time 5    Period Weeks    Status New  PT SHORT TERM GOAL #2   Title Pt will demonstrate improved flexibility in R ankle dorsiflexion by at least 5 degrees and L ankle dorsiflexion by at least 3 degrees for improved flexibility for improved gait and decreased pain.    Baseline A/ROM R dorsiflexion -5 degrees, L 4 degrees    Time 5    Period Weeks    Status New      PT SHORT TERM GOAL #3   Title Pt will improve TUG score to less than or equal to 13.5 seconds for improved gait efficiency and decreased fall risk.    Baseline 14.25 sec, festination with initiation of gait    Time 5    Period Weeks    Status New      PT SHORT TERM GOAL #4   Title Pt will improve giat velocity to at least 2.3 ft/sec for improved gait efficiency and safety, with appropriate assistive device.    Time 5    Period Weeks    Status New             PT Long Term Goals - 01/27/20 1454      PT LONG TERM GOAL #1   Title Pt will be independent with progression of HEP for improved flexibility, strength, balance, transfers, and gait.  TARGET 03/23/2020    Time 9    Period Weeks    Status New      PT LONG TERM GOAL #2   Title Pt will rate no more than 2 point increase in pain from baseline, with gait > 5 minutes in duration for improved overall functional mobility.    Baseline increased pain with prolonged walking and standing    Time 9    Period Weeks    Status New      PT LONG TERM GOAL #3   Title Pt will improve TUG cognitive score to less than or equal to 15 seconds for decreased fall risk.    Time 9    Period Weeks    Status New      PT LONG TERM GOAL #4   Title Pt will improve gait velocity to at least 2.62 ft/sec  for improved gait efficiency and safety.    Time 9    Period Weeks    Status New      PT LONG TERM GOAL #5   Title Pt will demonstrate improved R ankle strength to at least 3+/5 for improved balance to prevent falls.    Time 9    Period Weeks    Status New                 Plan - 02/06/20 0806    Clinical Impression Statement Educated patient today in importance of HEP, including stretching and use of ice after stretching and exercises(pt did not perform HEP over the weekend, but did a lot of yardwork).  Educated in correct use of walking pole and attempted cane use in therapy today; pt does have occasional festinating episodes and PT provides consistent cues for equal/even weightshift and step length.  He does not seem to report as much pain at end of session today.  He will continue to benefit from skilled PT to further address flexibility, strength, and gait training for improved mobility.    Personal Factors and Comorbidities Comorbidity 3+    Comorbidities 08/24/2019  R ankle fusion talonavicular/medial cuneiform arthrodesis, DVT/PE 07/2019, pneumonia, sepsis    Examination-Activity Limitations Locomotion Level;Transfers;Stand;Carry    Examination-Participation  Restrictions Church;Community Activity;Other   Community exercise   Stability/Clinical Decision Making Evolving/Moderate complexity    Rehab Potential Good    PT Frequency 2x / week    PT Duration Other (comment)   8 weeks plus eval visit = 9 weeks   PT Treatment/Interventions ADLs/Self Care Home Management;DME Instruction;Moist Heat;Gait training;Functional mobility training;Therapeutic activities;Therapeutic exercise;Balance training;Neuromuscular re-education;Manual techniques;Patient/family education;Passive range of motion    PT Next Visit Plan Continue stretch for heelcords/gastroc; progress ankle A/ROM and stregthening with resisted bands; gait training for improved heelstrike, step length; gait along counter, progress  to try cane again    Consulted and Agree with Plan of Care Patient           Patient will benefit from skilled therapeutic intervention in order to improve the following deficits and impairments:  Abnormal gait, Decreased range of motion, Difficulty walking, Impaired tone, Decreased activity tolerance, Pain, Decreased balance, Impaired flexibility, Decreased mobility, Decreased strength  Visit Diagnosis: Stiffness of right ankle, not elsewhere classified  Muscle weakness (generalized)  Other abnormalities of gait and mobility  Unsteadiness on feet     Problem List Patient Active Problem List   Diagnosis Date Noted  . Primary osteoarthritis, right ankle and foot   . Lower extremity edema 08/10/2019  . Acute DVT (deep venous thrombosis) ([place]) 07/19/2019  . History of pulmonary embolism 07/19/2019  . Parkinson's disease ([place]) 02/03/2018  . Community acquired pneumonia of left lower lobe of lung 12/23/2017  . Sepsis ([place]) 12/23/2017  . Morbid obesity due to excess calories ([place]) 08/23/2016  . OSA on CPAP 08/23/2016  . Dyspnea on exertion 08/22/2016  . Umbilical hernia 30/02/2329  . Persistent atrial fibrillation ([place])   . Encounter for therapeutic drug monitoring 08/12/2013  . Long term current use of anticoagulant 07/30/2010  . COLONIC POLYPS 06/03/2010  . Factor V Leiden ([place]) 06/03/2010  . GERD 06/03/2010  . HIATAL HERNIA 06/03/2010  . BENIGN PROSTATIC HYPERTROPHY, WITH OBSTRUCTION 06/03/2010  . PSORIASIS 06/03/2010    Kepler Mccabe W. 02/06/2020, 8:11 AM Frazier Butt., PT  [facility] [address] [place], [place], [address] Phone: [phone]   Fax:  [phone]  Name: KERIM STATZER MRN: [id number] Date of Birth: [birth year]

## 2020-02-09 ENCOUNTER — Ambulatory Visit: Payer: PPO | Admitting: Physical Therapy

## 2020-02-09 ENCOUNTER — Other Ambulatory Visit: Payer: Self-pay

## 2020-02-09 DIAGNOSIS — M6281 Muscle weakness (generalized): Secondary | ICD-10-CM

## 2020-02-09 DIAGNOSIS — R2681 Unsteadiness on feet: Secondary | ICD-10-CM

## 2020-02-09 DIAGNOSIS — M25671 Stiffness of right ankle, not elsewhere classified: Secondary | ICD-10-CM

## 2020-02-09 DIAGNOSIS — R2689 Other abnormalities of gait and mobility: Secondary | ICD-10-CM

## 2020-02-09 NOTE — Therapy (Signed)
[facility] [address] [place], [place], [address] Phone: [phone]   Fax:  [phone]  Physical Therapy Treatment  Patient Details  Name: Stanley Wright MRN: [id number] Date of Birth: Aug 01, 1945 Referring Provider (PT): Audrea Muscat Persons, PA-C   Encounter Date: 02/09/2020   PT End of Session - 02/09/20 0806    Visit Number 4    Number of Visits 17    Date for PT Re-Evaluation 58/85/02   90 day cert from 9 wk POC   Authorization Type [employer]    PT Start Time 0804    PT Stop Time 0843    PT Time Calculation (min) 39 min    Activity Tolerance Patient tolerated treatment well   Pt rates pain as 1-2/10 at end of session   Behavior During Therapy [facility] for tasks assessed/performed           Past Medical History:  Diagnosis Date  . Arthritis   . BENIGN PROSTATIC HYPERTROPHY, WITH OBSTRUCTION   . Cancer (HCC)    skin, basal, squamous  . COLONIC POLYPS   . Diverticulitis   . DVT (deep venous thrombosis) ([place]) 2000  . Dysrhythmia    Hx Afib- 2017  . Early cataract   . Factor 5 Leiden mutation, heterozygous ([place])   . GERD (gastroesophageal reflux disease)    not on medication  . Hearing aid worn    B/L  . HIATAL HERNIA   . ITP (idiopathic thrombocytopenic purpura) 2003  . Long term current use of anticoagulant   . Osteoarthritis    right foot  . Parkinson's disease ([place]) 02/03/2018  . PSORIASIS   . Pulmonary embolus ([place]) 2000  . Shortness of breath dyspnea    at times - Talking alot  . Sleep apnea   . Wears glasses     Past Surgical History:  Procedure Laterality Date  . ANKLE FUSION Right 08/24/2019   Procedure: RIGHT TALO-NAVICULAR FUSION;  Surgeon: Newt Minion, MD;  Location: [facility];  Service: Orthopedics;  Laterality: Right;  . CARDIOVERSION N/A 11/22/2015   Procedure: CARDIOVERSION;  Surgeon: Sanda Klein, MD;  [facility];  Service: Cardiovascular;  Laterality: N/A;    . COLONOSCOPY    . HERNIA REPAIR Left    Inguinal- x2 . mesh x1  . INSERTION OF MESH N/A 02/25/2016   Procedure: INSERTION OF MESH;  Surgeon: Rolm Bookbinder, MD;  Location: [facility];  Service: General;  Laterality: N/A;  . LUNG REMOVAL, PARTIAL Right 2004   was fungal and not cancerous  . PROSTATE SURGERY    . UMBILICAL HERNIA REPAIR N/A 02/25/2016   Procedure: LAPAROSCOPIC UMBILICAL HERNIA REPAIR;  Surgeon: Rolm Bookbinder, MD;  Location: [facility];  Service: General;  Laterality: N/A;    There were no vitals filed for this visit.   Subjective Assessment - 02/09/20 0805    Subjective Pretty tender on the outer and top of the foot.  Did a lot of walking at my daughter's yesterday, after doing my exercises.  Will plan to get the new shoes this weekend.  Did do the ice several times, and that helped.    Pertinent History R ankle fusion talonavicular/medial cuneiform arthrodesis, DVT/PE 07/2019, pneumonia, sepsis    Limitations Walking    Patient Stated Goals Pt's goals for therapy are to learn how to walk again.    Currently in Pain? Yes    Pain Score 5     Pain Location Ankle  Pain Orientation Right;Lateral    Pain Descriptors / Indicators Tender    Pain Type Acute pain    Pain Onset In the past 7 days    Aggravating Factors  walking    Pain Relieving Factors sitting and unweighting                             [facility] Adult PT Treatment/Exercise - 02/09/20 0809      Ambulation/Gait   Ambulation/Gait Yes    Ambulation/Gait Assistance 5: Supervision    Ambulation/Gait Assistance Details Festination upon initiating gait.  PT provides cues for patient to weightshift slowly, then initiate gait.    Ambulation Distance (Feet) 230 Feet    Assistive device Other (Comment)   single walking pole-pt carries most of the time   Gait Pattern Step-through pattern;Decreased arm swing - right;Decreased arm swing - left;Decreased step length - right;Decreased step length -  left;Decreased stance time - right;Decreased dorsiflexion - right;Decreased dorsiflexion - left;Right foot flat;Festinating    Ambulation Surface Level;Indoor    Gait Comments Cues for deliberate, equal/even step length.  For 2nd lap around, PT trials aircast on RLE to provide medial/lateral stability.  Pt reports it feels better. and more stable.      High Level Balance   High Level Balance Activities Other (comment)    High Level Balance Comments Sidestep and weigthshifting x 10 reps each leg;   2nd set with Aircast on RLE for improved medial/lateral stability.  Forward step and weightshift x 10 reps, RLE with focus on heelstrike and weightshift; 2nd set wearing Aircast RLE.  With LLE step and weigthshfit, pt has difficulty bearing weight through RLE to bring LLE back to midline.  2nd set of 10 reps wearing Aircast on RLE. with improved ability to bear weight through RLE.      Knee/Hip Exercises: Stretches   Active Hamstring Stretch Right;Left;3 reps;30 seconds    Active Hamstring Stretch Limitations foot propped on higher stool    Gastroc Stretch Right;Left;10 seconds;2 reps      Ankle Exercises: Seated   Towel Inversion/Eversion 3 reps;Limitations   R and LLE; cues for slowed pace RLE   Other Seated Ankle Exercises Using large green noodle roll-active assisted dorsiflexion, plantarflexion 10 reps each leg    Other Seated Ankle Exercises Passive ROM ankle plantarflexion/dorsiflexion, with hold at end ranges, 10 reps; then passive ankle inversion/eversion with hold at end range x 10 reps                  PT Education - 02/09/20 1010    Education Details Benefit of use of AirCast/ Ankle ASO (which pt uses at home) for improved stability through foot; recommended more stable, supportive footwear (instead of current Crocs) for improved stability with standing.    Person(s) Educated Patient    Methods Explanation    Comprehension Verbalized understanding            PT Short Term  Goals - 01/27/20 1447      PT SHORT TERM GOAL #1   Title Pt will be independent with HEP for improved flexibility, strength, balance, transfers, and gait.  TARGET 02/24/2020    Time 5    Period Weeks    Status New      PT SHORT TERM GOAL #2   Title Pt will demonstrate improved flexibility in R ankle dorsiflexion by at least 5 degrees and L ankle dorsiflexion by at least 3  degrees for improved flexibility for improved gait and decreased pain.    Baseline A/ROM R dorsiflexion -5 degrees, L 4 degrees    Time 5    Period Weeks    Status New      PT SHORT TERM GOAL #3   Title Pt will improve TUG score to less than or equal to 13.5 seconds for improved gait efficiency and decreased fall risk.    Baseline 14.25 sec, festination with initiation of gait    Time 5    Period Weeks    Status New      PT SHORT TERM GOAL #4   Title Pt will improve giat velocity to at least 2.3 ft/sec for improved gait efficiency and safety, with appropriate assistive device.    Time 5    Period Weeks    Status New             PT Long Term Goals - 01/27/20 1454      PT LONG TERM GOAL #1   Title Pt will be independent with progression of HEP for improved flexibility, strength, balance, transfers, and gait.  TARGET 03/23/2020    Time 9    Period Weeks    Status New      PT LONG TERM GOAL #2   Title Pt will rate no more than 2 point increase in pain from baseline, with gait > 5 minutes in duration for improved overall functional mobility.    Baseline increased pain with prolonged walking and standing    Time 9    Period Weeks    Status New      PT LONG TERM GOAL #3   Title Pt will improve TUG cognitive score to less than or equal to 15 seconds for decreased fall risk.    Time 9    Period Weeks    Status New      PT LONG TERM GOAL #4   Title Pt will improve gait velocity to at least 2.62 ft/sec for improved gait efficiency and safety.    Time 9    Period Weeks    Status New      PT LONG TERM  GOAL #5   Title Pt will demonstrate improved R ankle strength to at least 3+/5 for improved balance to prevent falls.    Time 9    Period Weeks    Status New                 Plan - 02/09/20 1013    Clinical Impression Statement Pt is continuing to rate pain at 5/10 with most weightbearing tasks.  Educated patient that 1)Crocs that he is continueing to wear are not providing enough support and stability for his R foot and he needs more supportive footwear and 2) trial use of Aircast helped with better support in medial/lateral aspect of R foot.  Pt has ankle ASO at home and PT educated pt in trying to begin using that again.  Pt will continue to benefit from continued skilled PT to further address flexibility, strength, gait training for improved overall mobility.    Personal Factors and Comorbidities Comorbidity 3+    Comorbidities 08/24/2019  R ankle fusion talonavicular/medial cuneiform arthrodesis, DVT/PE 07/2019, pneumonia, sepsis    Examination-Activity Limitations Locomotion Level;Transfers;Stand;Carry    Examination-Participation Restrictions Church;Community Activity;Other   Community exercise   Stability/Clinical Decision Making Evolving/Moderate complexity    Rehab Potential Good    PT Frequency 2x / week    PT  Duration Other (comment)   8 weeks plus eval visit = 9 weeks   PT Treatment/Interventions ADLs/Self Care Home Management;DME Instruction;Moist Heat;Gait training;Functional mobility training;Therapeutic activities;Therapeutic exercise;Balance training;Neuromuscular re-education;Manual techniques;Patient/family education;Passive range of motion    PT Next Visit Plan R ankle flexibility and strengthening.  Gait and standing with pt's ankle ASO vs. Aircast in clinic; work on weigthsfhiting, gait training-try in parallel bars; try cane again.  Did pt get his new shoes?    Consulted and Agree with Plan of Care Patient           Patient will benefit from skilled therapeutic  intervention in order to improve the following deficits and impairments:  Abnormal gait, Decreased range of motion, Difficulty walking, Impaired tone, Decreased activity tolerance, Pain, Decreased balance, Impaired flexibility, Decreased mobility, Decreased strength  Visit Diagnosis: Stiffness of right ankle, not elsewhere classified  Muscle weakness (generalized)  Unsteadiness on feet  Other abnormalities of gait and mobility     Problem List Patient Active Problem List   Diagnosis Date Noted  . Primary osteoarthritis, right ankle and foot   . Lower extremity edema 08/10/2019  . Acute DVT (deep venous thrombosis) ([place]) 07/19/2019  . History of pulmonary embolism 07/19/2019  . Parkinson's disease ([place]) 02/03/2018  . Community acquired pneumonia of left lower lobe of lung 12/23/2017  . Sepsis ([place]) 12/23/2017  . Morbid obesity due to excess calories ([place]) 08/23/2016  . OSA on CPAP 08/23/2016  . Dyspnea on exertion 08/22/2016  . Umbilical hernia 82/64/1583  . Persistent atrial fibrillation ([place])   . Encounter for therapeutic drug monitoring 08/12/2013  . Long term current use of anticoagulant 07/30/2010  . COLONIC POLYPS 06/03/2010  . Factor V Leiden ([employer]) 06/03/2010  . GERD 06/03/2010  . HIATAL HERNIA 06/03/2010  . BENIGN PROSTATIC HYPERTROPHY, WITH OBSTRUCTION 06/03/2010  . PSORIASIS 06/03/2010    Fintan Grater W. 02/09/2020, 11:50 AM  Frazier Butt., PT   [facility] [address] [place], [place], [address] Phone: [phone]   Fax:  [phone]  Name: ASHE GAGO MRN: [id number] Date of Birth: [birth year]

## 2020-02-13 ENCOUNTER — Ambulatory Visit: Payer: PPO | Admitting: Physical Therapy

## 2020-02-13 ENCOUNTER — Other Ambulatory Visit: Payer: Self-pay

## 2020-02-13 DIAGNOSIS — M25671 Stiffness of right ankle, not elsewhere classified: Secondary | ICD-10-CM | POA: Diagnosis not present

## 2020-02-13 DIAGNOSIS — R2681 Unsteadiness on feet: Secondary | ICD-10-CM

## 2020-02-13 DIAGNOSIS — M6281 Muscle weakness (generalized): Secondary | ICD-10-CM

## 2020-02-13 NOTE — Therapy (Signed)
[facility] [address] [place], [place], [address] Phone: [phone]   Fax:  [phone]  Physical Therapy Treatment  Patient Details  Name: Stanley Wright MRN: [id number] Date of Birth: [birth year] Referring Provider (PT): Audrea Muscat Persons, PA-C   Encounter Date: 02/13/2020   PT End of Session - 02/13/20 0854    Visit Number 5    Number of Visits 17    Date for PT Re-Evaluation 67[birth year]   90 day cert from 9 wk POC   Authorization Type [employer]    PT Start Time 0848    PT Stop Time 0929    PT Time Calculation (min) 41 min    Activity Tolerance Patient tolerated treatment well;No increased pain    Behavior During Therapy [facility] for tasks assessed/performed           Past Medical History:  Diagnosis Date   Arthritis    BENIGN PROSTATIC HYPERTROPHY, WITH OBSTRUCTION    Cancer ([place])    skin, basal, squamous   COLONIC POLYPS    Diverticulitis    DVT (deep venous thrombosis) ([place]) 2000   Dysrhythmia    Hx Afib- 2017   Early cataract    Factor 5 Leiden mutation, heterozygous ([place])    GERD (gastroesophageal reflux disease)    not on medication   Hearing aid worn    B/L   HIATAL HERNIA    ITP (idiopathic thrombocytopenic purpura) 2003   Long term current use of anticoagulant    Osteoarthritis    right foot   Parkinson's disease ([place]) 02/03/2018   PSORIASIS    Pulmonary embolus ([place]) 2000   Shortness of breath dyspnea    at times - Talking alot   Sleep apnea    Wears glasses     Past Surgical History:  Procedure Laterality Date   ANKLE FUSION Right 08/24/2019   Procedure: RIGHT TALO-NAVICULAR FUSION;  Surgeon: Newt Minion, MD;  Location: [facility];  Service: Orthopedics;  Laterality: Right;   CARDIOVERSION N/A 11/22/2015   Procedure: CARDIOVERSION;  Surgeon: Sanda Klein, MD;  Location: [facility];  Service: Cardiovascular;  Laterality: N/A;   COLONOSCOPY      HERNIA REPAIR Left    Inguinal- x2 . mesh x1   INSERTION OF MESH N/A 02/25/2016   Procedure: INSERTION OF MESH;  Surgeon: Rolm Bookbinder, MD;  Location: [facility];  Service: General;  Laterality: N/A;   LUNG REMOVAL, PARTIAL Right 2004   was fungal and not cancerous   PROSTATE SURGERY     UMBILICAL HERNIA REPAIR N/A 02/25/2016   Procedure: LAPAROSCOPIC UMBILICAL HERNIA REPAIR;  Surgeon: Rolm Bookbinder, MD;  Location: [facility];  Service: General;  Laterality: N/A;    There were no vitals filed for this visit.   Subjective Assessment - 02/13/20 0852    Subjective Did not go to get new shoes yet because the R ankle is very swollen.  Can't get the compression stocking on.  Feel like the range is improving.    Pertinent History R ankle fusion talonavicular/medial cuneiform arthrodesis, DVT/PE 07/2019, pneumonia, sepsis    Limitations Walking    Patient Stated Goals Pt's goals for therapy are to learn how to walk again.    Currently in Pain? No/denies    Pain Onset In the past 7 days                             [facility]  Adult PT Treatment/Exercise - 02/13/20 0001      Ambulation/Gait   Ambulation/Gait Yes    Ambulation/Gait Assistance 5: Supervision    Ambulation/Gait Assistance Details Festination upon initiating gait.    Ambulation Distance (Feet) 150 Feet   60 ft x 2   Assistive device Other (Comment)   single walking pole   Gait Pattern Step-through pattern;Decreased arm swing - right;Decreased arm swing - left;Decreased step length - right;Decreased step length - left;Decreased stance time - right;Decreased dorsiflexion - right;Decreased dorsiflexion - left;Right foot flat;Festinating    Ambulation Surface Level;Indoor    Gait Comments Did not use AirCast today for gait, (only used briefly for standing exercises) due to pt's increased swelling in R foot/ankle area.      High Level Balance   High Level Balance Activities Other (comment)    High Level Balance Comments  Wearing AirCast to help with medial lateral stability:  stagger stance forward/back rocking x 10 reps each foot position.  Pt requires cues for optimal weightshifting.  Forward<>back step and weigthshift x 10 reps RLE stepping, x 6 reps with RLE as stance/LLE stepping.  Marching in place 2 sets x 10 reps (pt with difficulty keepin shoe on his R foot.      Ankle Exercises: Seated   Other Seated Ankle Exercises Seated resisted ankle dorsiflexion x 10 reps, eversion x 10 reps, BLE red theraband      Ankle Exercises: Supine   Other Supine Ankle Exercises Supine with RLE propped on bolster:  passive ROM/stretch at end range in R ankle dorsiflexion, R ankle eversion, 2 sets x 10 reps each.    Other Supine Ankle Exercises Measured active ankle dorsiflexion /plantarflexion in supine:  5 degree beyond neutral; 34 degrees          With passive range/stretching, PT provides retrograde massage with each motion, to assist with swelling.  Additional seated exercises: -Use of ball maze for ankle flexibility and strength:  BLE positioned on footprint area-ankle range to maneuver ball around maze to center, x 1 rep.  Then with RLE positioning in middle foot position area-ankle range to maneuver ball around maze to center, x 1 rep.  Then BLEs again using ankle range to maneuver golf ball into single hole.  Each activity takes several minutes to perform.        PT Education - 02/13/20 1624    Education Details Updates to HEP; discussed pt's RLE swelling (no redness, pain or heat), but educated in importance of weaing compression stockings as recommended by MD; to contact MD regarding swelling if any changes occur    Person(s) Educated Patient    Methods Explanation    Comprehension Verbalized understanding            PT Short Term Goals - 01/27/20 1447      PT SHORT TERM GOAL #1   Title Pt will be independent with HEP for improved flexibility, strength, balance, transfers, and gait.  TARGET 02/24/2020     Time 5    Period Weeks    Status New      PT SHORT TERM GOAL #2   Title Pt will demonstrate improved flexibility in R ankle dorsiflexion by at least 5 degrees and L ankle dorsiflexion by at least 3 degrees for improved flexibility for improved gait and decreased pain.    Baseline A/ROM R dorsiflexion -5 degrees, L 4 degrees    Time 5    Period Weeks    Status New  PT SHORT TERM GOAL #3   Title Pt will improve TUG score to less than or equal to 13.5 seconds for improved gait efficiency and decreased fall risk.    Baseline 14.25 sec, festination with initiation of gait    Time 5    Period Weeks    Status New      PT SHORT TERM GOAL #4   Title Pt will improve giat velocity to at least 2.3 ft/sec for improved gait efficiency and safety, with appropriate assistive device.    Time 5    Period Weeks    Status New             PT Long Term Goals - 01/27/20 1454      PT LONG TERM GOAL #1   Title Pt will be independent with progression of HEP for improved flexibility, strength, balance, transfers, and gait.  TARGET 03/23/2020    Time 9    Period Weeks    Status New      PT LONG TERM GOAL #2   Title Pt will rate no more than 2 point increase in pain from baseline, with gait > 5 minutes in duration for improved overall functional mobility.    Baseline increased pain with prolonged walking and standing    Time 9    Period Weeks    Status New      PT LONG TERM GOAL #3   Title Pt will improve TUG cognitive score to less than or equal to 15 seconds for decreased fall risk.    Time 9    Period Weeks    Status New      PT LONG TERM GOAL #4   Title Pt will improve gait velocity to at least 2.62 ft/sec for improved gait efficiency and safety.    Time 9    Period Weeks    Status New      PT LONG TERM GOAL #5   Title Pt will demonstrate improved R ankle strength to at least 3+/5 for improved balance to prevent falls.    Time 9    Period Weeks    Status New                  Plan - 02/13/20 1626    Clinical Impression Statement Not as much pain today with standing and seated exercise tasks.  Pt appears to have more swelling in R foot/ankle today (no redness, heat, pain noted), but he has not been wearing compression stockings (unable to get on by himself).  The swelling has limited him from getting new shoes.  Despite the swelling, pt is noting improved ankle range of motion by 10 degrees in ankle dorsiflexion to 5 degrees beyond neutral, and by 9 degrees for plantarflexion.  His swelling is limiting progress with shoewear and assistance with use of suppor for medial/lateral stability.  Have advised pt to contact MD for furhter assistance wiht swelling.    Personal Factors and Comorbidities Comorbidity 3+    Comorbidities 08/24/2019  R ankle fusion talonavicular/medial cuneiform arthrodesis, DVT/PE 07/2019, pneumonia, sepsis    Examination-Activity Limitations Locomotion Level;Transfers;Stand;Carry    Examination-Participation Restrictions Church;Community Activity;Other   Community exercise   Stability/Clinical Decision Making Evolving/Moderate complexity    Rehab Potential Good    PT Frequency 2x / week    PT Duration Other (comment)   8 weeks plus eval visit = 9 weeks   PT Treatment/Interventions ADLs/Self Care Home Management;DME Instruction;Moist Heat;Gait training;Functional mobility training;Therapeutic activities;Therapeutic exercise;Balance  training;Neuromuscular re-education;Manual techniques;Patient/family education;Passive range of motion    PT Next Visit Plan R ankle flexibility and strengthening; review updates to HEP (asked pt to bring in compression socks and ASO).  Gait and standing with pt's ankle ASO vs. Aircast in clinic; work on weigthsfhiting, gait training-try in parallel bars; try cane again.  Did pt get his new shoes?    Consulted and Agree with Plan of Care Patient           Patient will benefit from skilled therapeutic  intervention in order to improve the following deficits and impairments:  Abnormal gait, Decreased range of motion, Difficulty walking, Impaired tone, Decreased activity tolerance, Pain, Decreased balance, Impaired flexibility, Decreased mobility, Decreased strength  Visit Diagnosis: Stiffness of right ankle, not elsewhere classified  Muscle weakness (generalized)  Unsteadiness on feet     Problem List Patient Active Problem List   Diagnosis Date Noted   Primary osteoarthritis, right ankle and foot    Lower extremity edema 08/10/2019   Acute DVT (deep venous thrombosis) ([place]) 07/19/2019   History of pulmonary embolism 07/19/2019   Parkinson's disease ([place]) 02/03/2018   Community acquired pneumonia of left lower lobe of lung 12/23/2017   Sepsis ([place]) 12/23/2017   Morbid obesity due to excess calories ([place]) 08/23/2016   OSA on CPAP 08/23/2016   Dyspnea on exertion 33/54/5625   Umbilical hernia 63/89/3734   Persistent atrial fibrillation ([place])    Encounter for therapeutic drug monitoring 08/12/2013   Long term current use of anticoagulant 07/30/2010   COLONIC POLYPS 06/03/2010   Factor V Leiden ([place]) 06/03/2010   GERD 06/03/2010   HIATAL HERNIA 06/03/2010   BENIGN PROSTATIC HYPERTROPHY, WITH OBSTRUCTION 06/03/2010   PSORIASIS 06/03/2010    Kourtney Montesinos W. 02/13/2020, 4:32 PM  Frazier Butt., PT   [facility] [address] [place], [place], [address] Phone: [phone]   Fax:  [phone]  Name: Stanley Wright MRN: [id number] Date of Birth: Jul 24, 1945

## 2020-02-13 NOTE — Patient Instructions (Addendum)
Access Code: [email or web address] URL: [email or web address] Date: 02/13/2020 Prepared by: Mady Haagensen  Exercises Seated Ankle Dorsiflexion AROM - 1-2 x daily - 5 x weekly - 1-2 sets - 10 reps - 3 sec hold Seated Heel Raise - 1-2 x daily - 5 x weekly - 1-2 sets - 10 reps - 3 sec hold Seated Ankle Eversion AROM - 1-2 x daily - 5 x weekly - 1-2 sets - 10 reps - 3 sec hold  Added 02/13/2020: Seated Ankle Dorsiflexion with Resistance - 1 x daily - 5 x weekly - 2-3 sets - 10 reps - 3 sec hold Seated Ankle Eversion with Resistance - 1 x daily - 5 x weekly - 2-3 sets - 10 reps - 3 sec hold  Instructed patient to discontinue seated ankle eversion using towel/pillowcase

## 2020-02-14 DIAGNOSIS — G245 Blepharospasm: Secondary | ICD-10-CM | POA: Diagnosis not present

## 2020-02-14 DIAGNOSIS — H57813 Brow ptosis, bilateral: Secondary | ICD-10-CM | POA: Diagnosis not present

## 2020-02-14 DIAGNOSIS — D485 Neoplasm of uncertain behavior of skin: Secondary | ICD-10-CM | POA: Diagnosis not present

## 2020-02-16 ENCOUNTER — Ambulatory Visit: Payer: PPO | Attending: Neurology | Admitting: Physical Therapy

## 2020-02-16 ENCOUNTER — Other Ambulatory Visit: Payer: Self-pay

## 2020-02-16 DIAGNOSIS — M25671 Stiffness of right ankle, not elsewhere classified: Secondary | ICD-10-CM | POA: Insufficient documentation

## 2020-02-16 DIAGNOSIS — M6281 Muscle weakness (generalized): Secondary | ICD-10-CM | POA: Insufficient documentation

## 2020-02-16 DIAGNOSIS — R2681 Unsteadiness on feet: Secondary | ICD-10-CM | POA: Diagnosis not present

## 2020-02-16 DIAGNOSIS — R2689 Other abnormalities of gait and mobility: Secondary | ICD-10-CM

## 2020-02-16 NOTE — Therapy (Signed)
[facility] [address] [place], [place], [address] Phone: [phone]   Fax:  [phone]  Physical Therapy Treatment  Patient Details  Name: Stanley Wright MRN: [id number] Date of Birth: 01/27/1946 Referring Provider (PT): Audrea Muscat Persons, PA-C   Encounter Date: 02/16/2020   PT End of Session - 02/16/20 1559    Visit Number 6    Number of Visits 17    Date for PT Re-Evaluation 83/15/17   90 day cert from 9 wk POC   Authorization Type [employer]    PT Start Time 6160    PT Stop Time 0932    PT Time Calculation (min) 38 min    Activity Tolerance Patient tolerated treatment well;No increased pain   Pt rates pain no higher than 1-2/10 during standing activities in session   Behavior During Therapy WFL for tasks assessed/performed           Past Medical History:  Diagnosis Date   Arthritis    BENIGN PROSTATIC HYPERTROPHY, WITH OBSTRUCTION    Cancer ([place])    skin, basal, squamous   COLONIC POLYPS    Diverticulitis    DVT (deep venous thrombosis) ([facility]) 2000   Dysrhythmia    Hx Afib- 2017   Early cataract    Factor 5 Leiden mutation, heterozygous ([place])    GERD (gastroesophageal reflux disease)    not on medication   Hearing aid worn    B/L   HIATAL HERNIA    ITP (idiopathic thrombocytopenic purpura) 2003   Long term current use of anticoagulant    Osteoarthritis    right foot   Parkinson's disease ([place]) 02/03/2018   PSORIASIS    Pulmonary embolus ([facility]) 2000   Shortness of breath dyspnea    at times - Talking alot   Sleep apnea    Wears glasses     Past Surgical History:  Procedure Laterality Date   ANKLE FUSION Right 08/24/2019   Procedure: RIGHT TALO-NAVICULAR FUSION;  Surgeon: Newt Minion, MD;  Location: [facility];  Service: Orthopedics;  Laterality: Right;   CARDIOVERSION N/A 11/22/2015   Procedure: CARDIOVERSION;  Surgeon: Sanda Klein, MD;  Location: [facility];  Service: Cardiovascular;  Laterality: N/A;   COLONOSCOPY     HERNIA REPAIR Left    Inguinal- x2 . mesh x1   INSERTION OF MESH N/A 02/25/2016   Procedure: INSERTION OF MESH;  Surgeon: Rolm Bookbinder, MD;  Location: [facility];  Service: General;  Laterality: N/A;   LUNG REMOVAL, PARTIAL Right 2004   was fungal and not cancerous   PROSTATE SURGERY     UMBILICAL HERNIA REPAIR N/A 02/25/2016   Procedure: LAPAROSCOPIC UMBILICAL HERNIA REPAIR;  Surgeon: Rolm Bookbinder, MD;  Location: [facility];  Service: General;  Laterality: N/A;    There were no vitals filed for this visit.   Subjective Assessment - 02/16/20 0857    Subjective Got my new shoes, they give me more support.  Maybe not quite so swollen in the right foot.    Pertinent History R ankle fusion talonavicular/medial cuneiform arthrodesis, DVT/PE 07/2019, pneumonia, sepsis    Limitations Walking    Patient Stated Goals Pt's goals for therapy are to learn how to walk again.    Currently in Pain? Yes    Pain Score 2     Pain Location Ankle    Pain Orientation Right;Lateral    Pain Descriptors / Indicators Tender    Pain Type Acute pain  Pain Onset In the past 7 days    Aggravating Factors  walking    Pain Relieving Factors new shoes, sitting and unweighting                             [facility] Adult PT Treatment/Exercise - 02/16/20 0001      Ambulation/Gait   Ambulation/Gait Yes    Ambulation/Gait Assistance 5: Supervision    Ambulation/Gait Assistance Details Festination not really noted today, until last time turning to sit and with figure-8 turn activity.    Ambulation Distance (Feet) 330 Feet   100 ft x 3   Assistive device None   brings in walking pole, but doesnt use in session   Gait Pattern Step-through pattern;Decreased arm swing - right;Decreased arm swing - left;Decreased step length - right;Decreased step length - left;Decreased stance time - right;Decreased dorsiflexion -  right;Decreased dorsiflexion - left;Right foot flat;Festinating    Ambulation Surface Level;Indoor    Pre-Gait Activities Figure-8 turn activity around cones, 2 reps each direction with no AD, cues for slowed, equal/even pace with gait even with turning for improved foot clearance/step length and decreased festination.  Discussed benefits of pt's new shoes-stability and support for improved gait and balance activities.    Gait Comments Pt brought in his ankle ASO, but did not use, as he feels that his new shoe [employer] sneaker) helps with support in RLE.      High Level Balance   High Level Balance Activities Other (comment)    High Level Balance Comments In parallel bars, stagger stance forward/back weightshifting, to focus on weigthshift through foot flat and for ankle dorsiflexion, 10 reps.  Forward step and weigthshift x 10 reps, each leg, BUE supported in parallel bars.  Marching in place 2 sets x 10 reps, with cues for increased height of hip/knee flexion.  On red compliant mat surface:  marching in place x 10 reps, then forward kicks x 10 reps, then forward step taps to ground, x 10 reps.  Side step taps from mat surface to ground, x 10 reps.      Ankle Exercises: Seated   Heel Raises Both;10 reps   2 sets   Other Seated Ankle Exercises Seated resisted ankle dorsiflexion x 10 reps, eversion x 10 reps, BLE red theraband.  (Review of HEP given last visit).  PT provides cues for slowed pace of each rep.                    PT Short Term Goals - 01/27/20 1447      PT SHORT TERM GOAL #1   Title Pt will be independent with HEP for improved flexibility, strength, balance, transfers, and gait.  TARGET 02/24/2020    Time 5    Period Weeks    Status New      PT SHORT TERM GOAL #2   Title Pt will demonstrate improved flexibility in R ankle dorsiflexion by at least 5 degrees and L ankle dorsiflexion by at least 3 degrees for improved flexibility for improved gait and decreased pain.      Baseline A/ROM R dorsiflexion -5 degrees, L 4 degrees    Time 5    Period Weeks    Status New      PT SHORT TERM GOAL #3   Title Pt will improve TUG score to less than or equal to 13.5 seconds for improved gait efficiency and decreased fall risk.  Baseline 14.25 sec, festination with initiation of gait    Time 5    Period Weeks    Status New      PT SHORT TERM GOAL #4   Title Pt will improve giat velocity to at least 2.3 ft/sec for improved gait efficiency and safety, with appropriate assistive device.    Time 5    Period Weeks    Status New             PT Long Term Goals - 01/27/20 1454      PT LONG TERM GOAL #1   Title Pt will be independent with progression of HEP for improved flexibility, strength, balance, transfers, and gait.  TARGET 03/23/2020    Time 9    Period Weeks    Status New      PT LONG TERM GOAL #2   Title Pt will rate no more than 2 point increase in pain from baseline, with gait > 5 minutes in duration for improved overall functional mobility.    Baseline increased pain with prolonged walking and standing    Time 9    Period Weeks    Status New      PT LONG TERM GOAL #3   Title Pt will improve TUG cognitive score to less than or equal to 15 seconds for decreased fall risk.    Time 9    Period Weeks    Status New      PT LONG TERM GOAL #4   Title Pt will improve gait velocity to at least 2.62 ft/sec for improved gait efficiency and safety.    Time 9    Period Weeks    Status New      PT LONG TERM GOAL #5   Title Pt will demonstrate improved R ankle strength to at least 3+/5 for improved balance to prevent falls.    Time 9    Period Weeks    Status New                 Plan - 02/16/20 1600    Clinical Impression Statement Pt presents to OPPT visit today with new Rolena Infante sneakers, which appear to be supportive and provide adequate medial/lateral stability with stnading and gait tasks.  Pt's gait is overall improved today, with more  equal, even step length thorughout session, festination only at end of session when pt appears more fatigued.  He will benefit from skilled PT to further address balance, gait, ankle stability/flexibility for improved overall functional mobility.    Personal Factors and Comorbidities Comorbidity 3+    Comorbidities 08/24/2019  R ankle fusion talonavicular/medial cuneiform arthrodesis, DVT/PE 07/2019, pneumonia, sepsis    Examination-Activity Limitations Locomotion Level;Transfers;Stand;Carry    Examination-Participation Restrictions Church;Community Activity;Other   Community exercise   Stability/Clinical Decision Making Evolving/Moderate complexity    Rehab Potential Good    PT Frequency 2x / week    PT Duration Other (comment)   8 weeks plus eval visit = 9 weeks   PT Treatment/Interventions ADLs/Self Care Home Management;DME Instruction;Moist Heat;Gait training;Functional mobility training;Therapeutic activities;Therapeutic exercise;Balance training;Neuromuscular re-education;Manual techniques;Patient/family education;Passive range of motion    PT Next Visit Plan Check STGs (check appts); R ankle flexibility/strengthning, gait training, compliant surface work    Oncologist with Plan of Care Patient           Patient will benefit from skilled therapeutic intervention in order to improve the following deficits and impairments:  Abnormal gait, Decreased range of motion, Difficulty  walking, Impaired tone, Decreased activity tolerance, Pain, Decreased balance, Impaired flexibility, Decreased mobility, Decreased strength  Visit Diagnosis: Other abnormalities of gait and mobility  Unsteadiness on feet  Muscle weakness (generalized)     Problem List Patient Active Problem List   Diagnosis Date Noted   Primary osteoarthritis, right ankle and foot    Lower extremity edema 08/10/2019   Acute DVT (deep venous thrombosis) ([place]) 07/19/2019   History of pulmonary embolism 07/19/2019     Parkinson's disease ([place]) 02/03/2018   Community acquired pneumonia of left lower lobe of lung 12/23/2017   Sepsis ([place]) 12/23/2017   Morbid obesity due to excess calories ([facility]) 08/23/2016   OSA on CPAP 08/23/2016   Dyspnea on exertion 78/93/8101   Umbilical hernia 75/03/2584   Persistent atrial fibrillation ([place])    Encounter for therapeutic drug monitoring 08/12/2013   Long term current use of anticoagulant 07/30/2010   COLONIC POLYPS 06/03/2010   Factor V Leiden ([place]) 06/03/2010   GERD 06/03/2010   HIATAL HERNIA 06/03/2010   BENIGN PROSTATIC HYPERTROPHY, WITH OBSTRUCTION 06/03/2010   PSORIASIS 06/03/2010    Abimbola Aki W. 02/16/2020, 4:04 PM  Frazier Butt., PT   [facility] [address] [place], [place], [address] Phone: [phone]   Fax:  [phone]  Name: Stanley Wright MRN: [id number] Date of Birth: 04/30/1946

## 2020-02-17 ENCOUNTER — Ambulatory Visit (INDEPENDENT_AMBULATORY_CARE_PROVIDER_SITE_OTHER): Payer: PPO | Admitting: Pharmacist

## 2020-02-17 DIAGNOSIS — I4819 Other persistent atrial fibrillation: Secondary | ICD-10-CM

## 2020-02-17 DIAGNOSIS — Z5181 Encounter for therapeutic drug level monitoring: Secondary | ICD-10-CM

## 2020-02-17 DIAGNOSIS — D6851 Activated protein C resistance: Secondary | ICD-10-CM

## 2020-02-17 LAB — POCT INR: INR: 3.1 — AB (ref 2.0–3.0)

## 2020-02-17 MED ORDER — ENOXAPARIN SODIUM 120 MG/0.8ML ~~LOC~~ SOLN: 120.0000 mg | Freq: Two times a day (BID) | SUBCUTANEOUS | 0 refills | Status: DC

## 2020-02-17 NOTE — Patient Instructions (Addendum)
Description   Continue on same dosage 1 tablet daily except 1.5 tablets on Mondays, Wednesdays and Fridays. Recheck INR on 03/05/20.  Increase your greens this weekend. Call with any questions if gets any new medications or scheduled for any procedures  [phone]  9/7: Last dose of Coumadin.  9/8: No Coumadin or Lovenox.  9/9: Inject Lovenox 120 mg in the fatty abdominal tissue at least 2 inches from the belly button twice a day about 12 hours apart, 8am and 8pm rotate sites. No Coumadin.  9/10: Inject Lovenox in the fatty tissue every 12 hours, 8am and 8pm. No Coumadin.  9/11: Inject Lovenox in the fatty tissue every 12 hours, 8am and 8pm. No Coumadin.  9/12: Inject Lovenox in the fatty tissue in the morning at 8 am (No PM dose). No Coumadin.  9/13: Procedure Day - No Lovenox - Resume Coumadin in the evening or as directed by doctor (take an extra half tablet with usual dose for 2 days then resume normal dose).  9/14: Resume Lovenox inject in the fatty tissue every 12 hours and take Coumadin.  9/15: Inject Lovenox in the fatty tissue every 12 hours and take Coumadin.  9/16: Inject Lovenox in the fatty tissue every 12 hours and take Coumadin.  9/17: Inject Lovenox in the fatty tissue every 12 hours and take Coumadin.  9/18: Inject Lovenox in the fatty tissue every 12 hours and take Coumadin.  9/19: Coumadin appt to check INR.

## 2020-02-21 ENCOUNTER — Encounter: Payer: Self-pay | Admitting: Orthopedic Surgery

## 2020-02-21 ENCOUNTER — Ambulatory Visit (INDEPENDENT_AMBULATORY_CARE_PROVIDER_SITE_OTHER): Payer: PPO | Admitting: Physician Assistant

## 2020-02-21 ENCOUNTER — Other Ambulatory Visit: Payer: Self-pay

## 2020-02-21 ENCOUNTER — Ambulatory Visit: Payer: PPO | Admitting: Physical Therapy

## 2020-02-21 VITALS — Ht 72.0 in | Wt 270.0 lb

## 2020-02-21 DIAGNOSIS — R2689 Other abnormalities of gait and mobility: Secondary | ICD-10-CM

## 2020-02-21 DIAGNOSIS — G8929 Other chronic pain: Secondary | ICD-10-CM

## 2020-02-21 DIAGNOSIS — M19071 Primary osteoarthritis, right ankle and foot: Secondary | ICD-10-CM

## 2020-02-21 DIAGNOSIS — M25671 Stiffness of right ankle, not elsewhere classified: Secondary | ICD-10-CM

## 2020-02-21 DIAGNOSIS — M6281 Muscle weakness (generalized): Secondary | ICD-10-CM

## 2020-02-21 DIAGNOSIS — M25571 Pain in right ankle and joints of right foot: Secondary | ICD-10-CM

## 2020-02-21 NOTE — Therapy (Signed)
[facility] [address] [place], [place], [address] Phone: [phone]   Fax:  [phone]  Physical Therapy Treatment  Patient Details  Name: Stanley Wright MRN: [id number] Date of Birth: [birth year] Referring Provider (PT): Audrea Muscat Persons, PA-C   Encounter Date: 02/21/2020   PT End of Session - 02/21/20 0927    Visit Number 7    Number of Visits 17    Date for PT Re-Evaluation 19/62/22   90 day cert from 9 wk POC   Authorization Type [employer]    PT Start Time 0829    PT Stop Time 0914    PT Time Calculation (min) 45 min    Activity Tolerance Patient tolerated treatment well   Pt rates pain no higher than 2-3/10 during standing activities in session   Behavior During Therapy [facility] for tasks assessed/performed           Past Medical History:  Diagnosis Date  . Arthritis   . BENIGN PROSTATIC HYPERTROPHY, WITH OBSTRUCTION   . Cancer ([facility])    skin, basal, squamous  . COLONIC POLYPS   . Diverticulitis   . DVT (deep venous thrombosis) ([place]) 2000  . Dysrhythmia    Hx Afib- 2017  . Early cataract   . Factor 5 Leiden mutation, heterozygous ([place])   . GERD (gastroesophageal reflux disease)    not on medication  . Hearing aid worn    B/L  . HIATAL HERNIA   . ITP (idiopathic thrombocytopenic purpura) 2003  . Long term current use of anticoagulant   . Osteoarthritis    right foot  . Parkinson's disease ([place]) 02/03/2018  . PSORIASIS   . Pulmonary embolus ([place]) 2000  . Shortness of breath dyspnea    at times - Talking alot  . Sleep apnea   . Wears glasses     Past Surgical History:  Procedure Laterality Date  . ANKLE FUSION Right 08/24/2019   Procedure: RIGHT TALO-NAVICULAR FUSION;  Surgeon: Newt Minion, MD;  Location: [facility];  Service: Orthopedics;  Laterality: Right;  . CARDIOVERSION N/A 11/22/2015   Procedure: CARDIOVERSION;  Surgeon: Sanda Klein, MD;  [facility];  Service:  Cardiovascular;  Laterality: N/A;  . COLONOSCOPY    . HERNIA REPAIR Left    Inguinal- x2 . mesh x1  . INSERTION OF MESH N/A 02/25/2016   Procedure: INSERTION OF MESH;  Surgeon: Rolm Bookbinder, MD;  Location: [facility];  Service: General;  Laterality: N/A;  . LUNG REMOVAL, PARTIAL Right 2004   was fungal and not cancerous  . PROSTATE SURGERY    . UMBILICAL HERNIA REPAIR N/A 02/25/2016   Procedure: LAPAROSCOPIC UMBILICAL HERNIA REPAIR;  Surgeon: Rolm Bookbinder, MD;  Location: [facility];  Service: General;  Laterality: N/A;    There were no vitals filed for this visit.   Subjective Assessment - 02/21/20 0831    Subjective No new changes; foot gets tired really easily; more tired than the other foot.    Pertinent History R ankle fusion talonavicular/medial cuneiform arthrodesis, DVT/PE 07/2019, pneumonia, sepsis    Limitations Walking    Patient Stated Goals Pt's goals for therapy are to learn how to walk again.    Currently in Pain? Yes    Pain Score 1     Pain Location Ankle    Pain Orientation Right;Anterior;Lateral    Pain Descriptors / Indicators Tender    Pain Type Acute pain    Pain Onset In the past 7  days    Pain Frequency Intermittent    Aggravating Factors  walking    Pain Relieving Factors new shoes, sitting and unweighting              [facility] PT Assessment - 02/21/20 0834      AROM   Right Ankle Dorsiflexion 0   to 5 degrees beyond neutral   Left Ankle Dorsiflexion 8    Left Ankle Plantar Flexion 40      PROM   Right Ankle Dorsiflexion 10    Right Ankle Plantar Flexion 40    Right Ankle Eversion 15                  REviewed HEP: Seated Ankle Dorsiflexion AROM - 1-2 x daily - 5 x weekly - 1-2 sets - 10 reps - 3 sec hold Seated Heel Raise - 1-2 x daily - 5 x weekly - 1-2 sets - 10 reps - 3 sec hold Seated Ankle Eversion AROM - 1-2 x daily - 5 x weekly - 1-2 sets - 10 reps - 3 sec hold   Added 02/13/2020: Seated Ankle Dorsiflexion with Resistance - 1 x  daily - 5 x weekly - 2-3 sets - 10 reps - 3 sec hold Seated Ankle Eversion with Resistance - 1 x daily - 5 x weekly - 2-3 sets - 10 reps - 3 sec hold  Pt return demo understnading of HEP.  Reports he is doing 2 sets of 10 reps at home.  Educated pt to increase to 3 sets of 10 with resisted red band.       [facility] Adult PT Treatment/Exercise - 02/21/20 0834      Ambulation/Gait   Ambulation/Gait Yes    Ambulation/Gait Assistance 5: Supervision    Ambulation/Gait Assistance Details Festinating noted with turns, more towards end of session with fatigue.  PT cues pt to slow down, rock to turn, stop to reset.    Ambulation Distance (Feet) 230 Feet   x 2, 115 ft; 80 ft   Assistive device None   carries walking pole in, but does not use   Gait Pattern Step-through pattern;Decreased arm swing - right;Decreased arm swing - left;Decreased step length - right;Decreased step length - left;Decreased stance time - right;Decreased dorsiflexion - right;Decreased dorsiflexion - left;Right foot flat;Festinating    Ambulation Surface Level;Indoor    Gait velocity 13.13 sec = 2.49 ft/sec    Gait Comments Cues provided for equal, even step length, heelstrike to toe-off; more foot slap noted towards end of gait.      Standardized Balance Assessment   Standardized Balance Assessment Timed Up and Go Test      Timed Up and Go Test   TUG Normal TUG    Normal TUG (seconds) 12.97   14.22, 14.19 (avg = 13.8 sec)     Knee/Hip Exercises: Standing   Heel Raises Both;2 sets;10 reps    Heel Raises Limitations Toe raises, 2 sets x 10 reps (limited dorsiflexion RLE, no incr c/o pain)    Other Standing Knee Exercises Alt. step taps to 4" step, 10 reps, 2 sets with BUE support      Ankle Exercises: Stretches   Gastroc Stretch 3 reps;10 seconds   foot propped on 4" step     Ankle Exercises: Seated   Other Seated Ankle Exercises Seated resisted ankle plantarflexion, RLE 2 sets x 10 reps with red band  PT Education - 02/21/20 0926    Education Details Added to HEP; educated pt on progress towards goals, POC and adding appts.    Person(s) Educated Patient    Methods Explanation;Demonstration;Handout    Comprehension Verbalized understanding;Returned demonstration            PT Short Term Goals - 02/21/20 0847      PT SHORT TERM GOAL #1   Title Pt will be independent with HEP for improved flexibility, strength, balance, transfers, and gait.  TARGET 02/24/2020    Time 5    Period Weeks    Status Achieved      PT SHORT TERM GOAL #2   Title Pt will demonstrate improved flexibility in R ankle dorsiflexion by at least 5 degrees and L ankle dorsiflexion by at least 3 degrees for improved flexibility for improved gait and decreased pain.    Baseline A/ROM R dorsiflexion -5 degrees, L 4 degrees; AROM R dorsiflexion 5 degrees, L 8 degrees    Time 5    Period Weeks    Status Achieved      PT SHORT TERM GOAL #3   Title Pt will improve TUG score to less than or equal to 13.5 seconds for improved gait efficiency and decreased fall risk.    Baseline 14.25 sec, festination with initiation of gait    Time 5    Period Weeks    Status Partially Met      PT SHORT TERM GOAL #4   Title Pt will improve giat velocity to at least 2.3 ft/sec for improved gait efficiency and safety, with appropriate assistive device.    Baseline 2.5 ft/sec    Time 5    Period Weeks    Status Achieved             PT Long Term Goals - 01/27/20 1454      PT LONG TERM GOAL #1   Title Pt will be independent with progression of HEP for improved flexibility, strength, balance, transfers, and gait.  TARGET 03/23/2020    Time 9    Period Weeks    Status New      PT LONG TERM GOAL #2   Title Pt will rate no more than 2 point increase in pain from baseline, with gait > 5 minutes in duration for improved overall functional mobility.    Baseline increased pain with prolonged walking and standing     Time 9    Period Weeks    Status New      PT LONG TERM GOAL #3   Title Pt will improve TUG cognitive score to less than or equal to 15 seconds for decreased fall risk.    Time 9    Period Weeks    Status New      PT LONG TERM GOAL #4   Title Pt will improve gait velocity to at least 2.62 ft/sec for improved gait efficiency and safety.    Time 9    Period Weeks    Status New      PT LONG TERM GOAL #5   Title Pt will demonstrate improved R ankle strength to at least 3+/5 for improved balance to prevent falls.    Time 9    Period Weeks    Status New                 Plan - 02/21/20 0929    Clinical Impression Statement Assessed STGs this visit, with pt meeting 3 of  4 goals.  STG 1 met for HEP, STG 2 met for improved range of motion, STG 4 met for gait velocity.  STG 3 partially met for TUG score, with improvement noted, but not to goal level.  Overall, pt is demonstrated improved R (and L) ankle flexibility, decreased R ankle pain, and improved overall gait pattern.  With fatigue, he notes increased festination with gait and slighlty increased pain.  He will continue to benefit from skilled PT to further address strength, flexibility, gait for improved overall functional mobility.    Personal Factors and Comorbidities Comorbidity 3+    Comorbidities 08/24/2019  R ankle fusion talonavicular/medial cuneiform arthrodesis, DVT/PE 07/2019, pneumonia, sepsis    Examination-Activity Limitations Locomotion Level;Transfers;Stand;Carry    Examination-Participation Restrictions Church;Community Activity;Other   Community exercise   Stability/Clinical Decision Making Evolving/Moderate complexity    Rehab Potential Good    PT Frequency 2x / week    PT Duration Other (comment)   8 weeks plus eval visit = 9 weeks   PT Treatment/Interventions ADLs/Self Care Home Management;DME Instruction;Moist Heat;Gait training;Functional mobility training;Therapeutic activities;Therapeutic exercise;Balance  training;Neuromuscular re-education;Manual techniques;Patient/family education;Passive range of motion    PT Next Visit Plan Review updates to HEP; R ankle flexibility/strengthening, gait and compliant surface work    Oncologist with Plan of Care Patient           Patient will benefit from skilled therapeutic intervention in order to improve the following deficits and impairments:  Abnormal gait, Decreased range of motion, Difficulty walking, Impaired tone, Decreased activity tolerance, Pain, Decreased balance, Impaired flexibility, Decreased mobility, Decreased strength  Visit Diagnosis: Stiffness of right ankle, not elsewhere classified  Muscle weakness (generalized)  Other abnormalities of gait and mobility     Problem List Patient Active Problem List   Diagnosis Date Noted  . Primary osteoarthritis, right ankle and foot   . Lower extremity edema 08/10/2019  . Acute DVT (deep venous thrombosis) ([place]) 07/19/2019  . History of pulmonary embolism 07/19/2019  . Parkinson's disease ([place]) 02/03/2018  . Community acquired pneumonia of left lower lobe of lung 12/23/2017  . Sepsis ([place]) 12/23/2017  . Morbid obesity due to excess calories ([place]) 08/23/2016  . OSA on CPAP 08/23/2016  . Dyspnea on exertion 08/22/2016  . Umbilical hernia 88/50/2774  . Persistent atrial fibrillation ([place])   . Encounter for therapeutic drug monitoring 08/12/2013  . Long term current use of anticoagulant 07/30/2010  . COLONIC POLYPS 06/03/2010  . Factor V Leiden ([place]) 06/03/2010  . GERD 06/03/2010  . HIATAL HERNIA 06/03/2010  . BENIGN PROSTATIC HYPERTROPHY, WITH OBSTRUCTION 06/03/2010  . PSORIASIS 06/03/2010    Hilliary Jock W. 02/21/2020, 9:32 AM  Frazier Butt., PT  [facility] [address] [place], [place], [address] Phone: [phone]   Fax:  [phone]  Name: Stanley Wright MRN: [id number] Date of Birth:  1945-12-04

## 2020-02-21 NOTE — Progress Notes (Signed)
Office Visit Note   Patient: Stanley Wright           Date of Birth: January 21, 1946           MRN: [id number] Visit Date: 02/21/2020              Requested by: Maude Leriche, PA-C [address],  [place] [address] PCP: Maude Leriche, PA-C  Chief Complaint  Patient presents with  . Right Ankle - Follow-up      HPI: Presents today for follow-up on his right foot.  He is status post talonavicular and medial cuneiform arthrodesis he is doing very well he has no complaints and has been working with physical therapy on strengthening and balance  Assessment & Plan: Visit Diagnoses: No diagnosis found.  Plan: He may follow-up as needed.  Follow-Up Instructions: No follow-ups on file.   Ortho Exam  Patient is alert, oriented, no adenopathy, well-dressed, normal affect, normal respiratory effort. Right foot well-healed surgical incision no swelling no cellulitis he is wearing a compression stocking.  Compartments are soft and nontender nontender to palpation over the midfoot and hindfoot.  Imaging: No results found. No images are attached to the encounter.  Labs: Lab Results  Component Value Date   HGBA1C 6.3 11/21/2015   HGBA1C 6.2 06/12/2015   REPTSTATUS 12/24/2017 FINAL 12/23/2017   CULT  12/23/2017    NO GROWTH Performed at [facility], [place] [address], [place], [place] [address]    LABORGA ESCHERICHIA COLI 12/22/2017     Lab Results  Component Value Date   ALBUMIN 4.1 01/17/2019   ALBUMIN 2.9 (L) 12/26/2017   ALBUMIN 3.0 (L) 12/25/2017    Lab Results  Component Value Date   MG 1.9 12/25/2017   No results found for: VD25OH  No results found for: PREALBUMIN CBC EXTENDED Latest Ref Rng & Units 08/24/2019 12/26/2017 12/25/2017  WBC 4.0 - 10.5 K/uL 4.7 4.7 4.8  RBC 4.22 - 5.81 MIL/uL 4.36 4.22 4.47  HGB 13.0 - 17.0 g/dL 14.1 13.6 14.3  HCT 39 - 52 % 43.7 40.8 43.3  PLT 150 - 400 K/uL 169 118(L) 107(L)  NEUTROABS 1.7 - 7.7 K/uL - - -   LYMPHSABS 0.7 - 4.0 K/uL - - -     Body mass index is 36.62 kg/m.  Orders:  No orders of the defined types were placed in this encounter.  No orders of the defined types were placed in this encounter.    Procedures: No procedures performed  Clinical Data: No additional findings.  ROS:  All other systems negative, except as noted in the HPI. Review of Systems  Objective: Vital Signs: Ht 6' (1.829 m)   Wt 270 lb (122.5 kg)   BMI 36.62 kg/m   Specialty Comments:  No specialty comments available.  PMFS History: Patient Active Problem List   Diagnosis Date Noted  . Primary osteoarthritis, right ankle and foot   . Lower extremity edema 08/10/2019  . Acute DVT (deep venous thrombosis) ([place]) 07/19/2019  . History of pulmonary embolism 07/19/2019  . Parkinson's disease ([place]) 02/03/2018  . Community acquired pneumonia of left lower lobe of lung 12/23/2017  . Sepsis ([place]) 12/23/2017  . Morbid obesity due to excess calories ([place]) 08/23/2016  . OSA on CPAP 08/23/2016  . Dyspnea on exertion 08/22/2016  . Umbilical hernia 97/35/3299  . Persistent atrial fibrillation ([place])   . Encounter for therapeutic drug monitoring 08/12/2013  . Long term current use of  anticoagulant 07/30/2010  . COLONIC POLYPS 06/03/2010  . Factor V Leiden ([place]) 06/03/2010  . GERD 06/03/2010  . HIATAL HERNIA 06/03/2010  . BENIGN PROSTATIC HYPERTROPHY, WITH OBSTRUCTION 06/03/2010  . PSORIASIS 06/03/2010   Past Medical History:  Diagnosis Date  . Arthritis   . BENIGN PROSTATIC HYPERTROPHY, WITH OBSTRUCTION   . Cancer (HCC)    skin, basal, squamous  . COLONIC POLYPS   . Diverticulitis   . DVT (deep venous thrombosis) ([place]) 2000  . Dysrhythmia    Hx Afib- 2017  . Early cataract   . Factor 5 Leiden mutation, heterozygous ([place])   . GERD (gastroesophageal reflux disease)    not on medication  . Hearing aid worn    B/L  . HIATAL HERNIA   . ITP (idiopathic thrombocytopenic purpura) 2003  .  Long term current use of anticoagulant   . Osteoarthritis    right foot  . Parkinson's disease ([place]) 02/03/2018  . PSORIASIS   . Pulmonary embolus ([place]) 2000  . Shortness of breath dyspnea    at times - Talking alot  . Sleep apnea   . Wears glasses     Family History  Problem Relation Age of Onset  . Cancer Father   . Breast cancer Sister   . Heart disease Brother   . Cancer Maternal Grandmother   . Stroke Paternal Grandfather     Past Surgical History:  Procedure Laterality Date  . ANKLE FUSION Right 08/24/2019   Procedure: RIGHT TALO-NAVICULAR FUSION;  Surgeon: Newt Minion, MD;  Location: [facility];  Service: Orthopedics;  Laterality: Right;  . CARDIOVERSION N/A 11/22/2015   Procedure: CARDIOVERSION;  Surgeon: Sanda Klein, MD;  [facility];  Service: Cardiovascular;  Laterality: N/A;  . COLONOSCOPY    . HERNIA REPAIR Left    Inguinal- x2 . mesh x1  . INSERTION OF MESH N/A 02/25/2016   Procedure: INSERTION OF MESH;  Surgeon: Rolm Bookbinder, MD;  Location: [facility];  Service: General;  Laterality: N/A;  . LUNG REMOVAL, PARTIAL Right 2004   was fungal and not cancerous  . PROSTATE SURGERY    . UMBILICAL HERNIA REPAIR N/A 02/25/2016   Procedure: LAPAROSCOPIC UMBILICAL HERNIA REPAIR;  Surgeon: Rolm Bookbinder, MD;  Location: [facility];  Service: General;  Laterality: N/A;   Social History   Occupational History    Employer: RETIRED    Comment: Engineer, structural  Tobacco Use  . Smoking status: Former Smoker    Packs/day: 1.00    Quit date: 06/16/1985    Years since quitting: 34.7  . Smokeless tobacco: Never Used  . Tobacco comment: quit approx age 60-   Vaping Use  . Vaping Use: Never used  Substance and Sexual Activity  . Alcohol use: No  . Drug use: No  . Sexual activity: Yes

## 2020-02-21 NOTE — Patient Instructions (Addendum)
Access Code: [email or web address] URL: [email or web address] Date: 02/21/2020 Prepared by: Mady Haagensen  Exercises Seated Ankle Dorsiflexion AROM - 1-2 x daily - 5 x weekly - 1-2 sets - 10 reps - 3 sec hold Seated Heel Raise - 1-2 x daily - 5 x weekly - 1-2 sets - 10 reps - 3 sec hold Seated Ankle Eversion AROM - 1-2 x daily - 5 x weekly - 1-2 sets - 10 reps - 3 sec hold Seated Ankle Dorsiflexion with Resistance - 1 x daily - 5 x weekly - 2-3 sets - 10 reps - 3 sec hold Seated Ankle Eversion with Resistance - 1 x daily - 5 x weekly - 2-3 sets - 10 reps - 3 sec hold  Added 02/21/2020: Seated Ankle Plantar Flexion with Resistance Loop - 1 x daily - 5 x weekly - 2-3 sets - 10 reps Staggered Stance Forward Backward Weight Shift with Counter Support - 1 x daily - 7 x weekly - 2 sets - 10 reps

## 2020-02-23 DIAGNOSIS — Z1159 Encounter for screening for other viral diseases: Secondary | ICD-10-CM | POA: Diagnosis not present

## 2020-02-24 ENCOUNTER — Other Ambulatory Visit: Payer: Self-pay

## 2020-02-24 ENCOUNTER — Ambulatory Visit: Payer: PPO | Admitting: Physical Therapy

## 2020-02-24 DIAGNOSIS — R2689 Other abnormalities of gait and mobility: Secondary | ICD-10-CM | POA: Diagnosis not present

## 2020-02-24 DIAGNOSIS — R2681 Unsteadiness on feet: Secondary | ICD-10-CM

## 2020-02-24 NOTE — Therapy (Signed)
[facility] [address] [place], [place], [address] Phone: [phone]   Fax:  [phone]  Physical Therapy Treatment  Patient Details  Name: GILBERTO STANFORTH MRN: [id number] Date of Birth: [birth year] Referring Provider (PT): Audrea Muscat Persons, PA-C   Encounter Date: 02/24/2020   PT End of Session - 02/24/20 1442    Visit Number 8    Number of Visits 17    Date for PT Re-Evaluation 32/44/01   90 day cert from 9 wk POC   Authorization Type [employer]    PT Start Time 0803    PT Stop Time 0844    PT Time Calculation (min) 41 min    Activity Tolerance Patient tolerated treatment well;Patient limited by pain   more pain today throughout, 6-7/10   Behavior During Therapy [facility] for tasks assessed/performed           Past Medical History:  Diagnosis Date  . Arthritis   . BENIGN PROSTATIC HYPERTROPHY, WITH OBSTRUCTION   . Cancer (HCC)    skin, basal, squamous  . COLONIC POLYPS   . Diverticulitis   . DVT (deep venous thrombosis) ([place]) 2000  . Dysrhythmia    Hx Afib- 2017  . Early cataract   . Factor 5 Leiden mutation, heterozygous ([place])   . GERD (gastroesophageal reflux disease)    not on medication  . Hearing aid worn    B/L  . HIATAL HERNIA   . ITP (idiopathic thrombocytopenic purpura) 2003  . Long term current use of anticoagulant   . Osteoarthritis    right foot  . Parkinson's disease ([place]) 02/03/2018  . PSORIASIS   . Pulmonary embolus ([place]) 2000  . Shortness of breath dyspnea    at times - Talking alot  . Sleep apnea   . Wears glasses     Past Surgical History:  Procedure Laterality Date  . ANKLE FUSION Right 08/24/2019   Procedure: RIGHT TALO-NAVICULAR FUSION;  Surgeon: Newt Minion, MD;  Location: [facility];  Service: Orthopedics;  Laterality: Right;  . CARDIOVERSION N/A 11/22/2015   Procedure: CARDIOVERSION;  Surgeon: Sanda Klein, MD;  [facility];  Service: Cardiovascular;   Laterality: N/A;  . COLONOSCOPY    . HERNIA REPAIR Left    Inguinal- x2 . mesh x1  . INSERTION OF MESH N/A 02/25/2016   Procedure: INSERTION OF MESH;  Surgeon: Rolm Bookbinder, MD;  Location: [facility];  Service: General;  Laterality: N/A;  . LUNG REMOVAL, PARTIAL Right 2004   was fungal and not cancerous  . PROSTATE SURGERY    . UMBILICAL HERNIA REPAIR N/A 02/25/2016   Procedure: LAPAROSCOPIC UMBILICAL HERNIA REPAIR;  Surgeon: Rolm Bookbinder, MD;  Location: [facility];  Service: General;  Laterality: N/A;    There were no vitals filed for this visit.   Subjective Assessment - 02/24/20 0806    Subjective Having a hard time walking this morning.  Walked alot yesterday without device.  Having a little soreness.    Pertinent History R ankle fusion talonavicular/medial cuneiform arthrodesis, DVT/PE 07/2019, pneumonia, sepsis    Limitations Walking    Patient Stated Goals Pt's goals for therapy are to learn how to walk again.    Currently in Pain? Yes    Pain Score 6     Pain Location Ankle    Pain Orientation Right;Anterior;Lateral    Pain Descriptors / Indicators Tender;Sore    Pain Type Chronic pain    Pain Onset In the past 7  days    Pain Frequency Intermittent    Aggravating Factors  walking    Pain Relieving Factors new shoes, moving helps                             [facility] Adult PT Treatment/Exercise - 02/24/20 0001      Ambulation/Gait   Ambulation/Gait Yes    Ambulation/Gait Assistance 5: Supervision    Ambulation/Gait Assistance Details Pt with overall shorter steps, more festination noted today.    Ambulation Distance (Feet) 115 Feet   x 2, 60 ft x 4   Assistive device None    Gait Pattern Step-through pattern;Decreased arm swing - right;Decreased arm swing - left;Decreased step length - right;Decreased step length - left;Decreased stance time - right;Decreased dorsiflexion - right;Decreased dorsiflexion - left;Right foot flat;Festinating    Ambulation  Surface Level;Indoor    Pre-Gait Activities Worked on quarter turns, at counter to 3:00/to 9:00, for imrpoved initiation of turning.  Pt needs cues for increased foot clearance.      Gait Comments Transitioned to shorter distance gait incorporating turns/change of directions.  Cues for SLOWED pace with weightshfiting turns.      Self-Care   Self-Care Other Self-Care Comments    Other Self-Care Comments  Discussed recent orthopedic MD visit-according to notes in chart, no tenderness, no cause fro concern in regards to his ankle fusion.  Pt reports MD says it may take up to 1 year for pain to subside/full healing to occur.  Discussed/reviewed tips to reduce freezing (particularly in crowded spaces)-to slow pace, use breathing techniques to relax prior to initiating gait.  Discussed using appropriate device based on pt's pain level/frequency/severity of freezing episodes (may warrant use of cane/pole or U-step rollator for better support for increased step length).      Neuro Re-ed    Neuro Re-ed Details  NMR for SLS activities, to improve timing and coordination:  alt step taps to 6" step, with forward lean briefly onto step.  Then alt step taps to 12" step,w ith pt reporting pain upon RLE return to stance on ground.  Progressed to LLE as stance with RLE forward step tap>into runner's stretch position,x  10 reps with UE support          Review of HEP from last visit  Seated Ankle Plantar Flexion with Resistance Loop - 1 x daily - 5 x weekly - 2-3 sets - 10 reps Staggered Stance Forward Backward Weight Shift with Counter Support - 1 x daily - 7 x weekly - 2 sets - 10 reps (cues for technique and weigthshfiting)        PT Education - 02/24/20 1441    Education Details See self-care:  using appropriate walker/assistive device to avoid longer duration freezing episodes    Person(s) Educated Patient    Methods Explanation    Comprehension Verbalized understanding;Verbal cues required             PT Short Term Goals - 02/21/20 0847      PT SHORT TERM GOAL #1   Title Pt will be independent with HEP for improved flexibility, strength, balance, transfers, and gait.  TARGET 02/24/2020    Time 5    Period Weeks    Status Achieved      PT SHORT TERM GOAL #2   Title Pt will demonstrate improved flexibility in R ankle dorsiflexion by at least 5 degrees and L ankle dorsiflexion by at least  3 degrees for improved flexibility for improved gait and decreased pain.    Baseline A/ROM R dorsiflexion -5 degrees, L 4 degrees; AROM R dorsiflexion 5 degrees, L 8 degrees    Time 5    Period Weeks    Status Achieved      PT SHORT TERM GOAL #3   Title Pt will improve TUG score to less than or equal to 13.5 seconds for improved gait efficiency and decreased fall risk.    Baseline 14.25 sec, festination with initiation of gait    Time 5    Period Weeks    Status Partially Met      PT SHORT TERM GOAL #4   Title Pt will improve giat velocity to at least 2.3 ft/sec for improved gait efficiency and safety, with appropriate assistive device.    Baseline 2.5 ft/sec    Time 5    Period Weeks    Status Achieved             PT Long Term Goals - 01/27/20 1454      PT LONG TERM GOAL #1   Title Pt will be independent with progression of HEP for improved flexibility, strength, balance, transfers, and gait.  TARGET 03/23/2020    Time 9    Period Weeks    Status New      PT LONG TERM GOAL #2   Title Pt will rate no more than 2 point increase in pain from baseline, with gait > 5 minutes in duration for improved overall functional mobility.    Baseline increased pain with prolonged walking and standing    Time 9    Period Weeks    Status New      PT LONG TERM GOAL #3   Title Pt will improve TUG cognitive score to less than or equal to 15 seconds for decreased fall risk.    Time 9    Period Weeks    Status New      PT LONG TERM GOAL #4   Title Pt will improve gait velocity to at least 2.62  ft/sec for improved gait efficiency and safety.    Time 9    Period Weeks    Status New      PT LONG TERM GOAL #5   Title Pt will demonstrate improved R ankle strength to at least 3+/5 for improved balance to prevent falls.    Time 9    Period Weeks    Status New                 Plan - 02/24/20 1443    Clinical Impression Statement Pt with more pain in R foot today (6-7/10), and more shuffling/festinating episodes noted.  Unsure of the cause, but did educate pt in use of appropriate assistive devices to help offset pain and help reduce freezing episodes.    Personal Factors and Comorbidities Comorbidity 3+    Comorbidities 08/24/2019  R ankle fusion talonavicular/medial cuneiform arthrodesis, DVT/PE 07/2019, pneumonia, sepsis    Examination-Activity Limitations Locomotion Level;Transfers;Stand;Carry    Examination-Participation Restrictions Church;Community Activity;Other   Community exercise   Stability/Clinical Decision Making Evolving/Moderate complexity    Rehab Potential Good    PT Frequency 2x / week    PT Duration Other (comment)   8 weeks plus eval visit = 9 weeks   PT Treatment/Interventions ADLs/Self Care Home Management;DME Instruction;Moist Heat;Gait training;Functional mobility training;Therapeutic activities;Therapeutic exercise;Balance training;Neuromuscular re-education;Manual techniques;Patient/family education;Passive range of motion    PT Next Visit  Plan Continue R ankle flexibility and strengthening; compliant surface work and gait training (starts/stops/stagger stance position to restart)    Consulted and Agree with Plan of Care Patient           Patient will benefit from skilled therapeutic intervention in order to improve the following deficits and impairments:  Abnormal gait, Decreased range of motion, Difficulty walking, Impaired tone, Decreased activity tolerance, Pain, Decreased balance, Impaired flexibility, Decreased mobility, Decreased  strength  Visit Diagnosis: Other abnormalities of gait and mobility  Unsteadiness on feet     Problem List Patient Active Problem List   Diagnosis Date Noted  . Primary osteoarthritis, right ankle and foot   . Lower extremity edema 08/10/2019  . Acute DVT (deep venous thrombosis) ([place]) 07/19/2019  . History of pulmonary embolism 07/19/2019  . Parkinson's disease ([place]) 02/03/2018  . Community acquired pneumonia of left lower lobe of lung 12/23/2017  . Sepsis ([place]) 12/23/2017  . Morbid obesity due to excess calories ([place]) 08/23/2016  . OSA on CPAP 08/23/2016  . Dyspnea on exertion 08/22/2016  . Umbilical hernia 67/34/1937  . Persistent atrial fibrillation ([place])   . Encounter for therapeutic drug monitoring 08/12/2013  . Long term current use of anticoagulant 07/30/2010  . COLONIC POLYPS 06/03/2010  . Factor V Leiden ([place]) 06/03/2010  . GERD 06/03/2010  . HIATAL HERNIA 06/03/2010  . BENIGN PROSTATIC HYPERTROPHY, WITH OBSTRUCTION 06/03/2010  . PSORIASIS 06/03/2010    Ying Blankenhorn W. 02/24/2020, 2:46 PM  Frazier Butt., PT   [facility] [address] [place], [place], [address] Phone: [phone]   Fax:  [phone]  Name: TOBYN OSGOOD MRN: [id number] Date of Birth: Aug 12, 1945

## 2020-02-25 ENCOUNTER — Other Ambulatory Visit: Payer: Self-pay | Admitting: Neurology

## 2020-02-27 DIAGNOSIS — K5730 Diverticulosis of large intestine without perforation or abscess without bleeding: Secondary | ICD-10-CM | POA: Diagnosis not present

## 2020-02-27 DIAGNOSIS — Z86010 Personal history of colonic polyps: Secondary | ICD-10-CM | POA: Diagnosis not present

## 2020-02-27 NOTE — Telephone Encounter (Signed)
Please contact office for an appointment, thanks

## 2020-02-28 DIAGNOSIS — L720 Epidermal cyst: Secondary | ICD-10-CM | POA: Diagnosis not present

## 2020-02-28 DIAGNOSIS — D485 Neoplasm of uncertain behavior of skin: Secondary | ICD-10-CM | POA: Diagnosis not present

## 2020-03-02 ENCOUNTER — Other Ambulatory Visit: Payer: Self-pay

## 2020-03-02 ENCOUNTER — Ambulatory Visit: Payer: PPO | Admitting: Physical Therapy

## 2020-03-02 DIAGNOSIS — R2681 Unsteadiness on feet: Secondary | ICD-10-CM

## 2020-03-02 DIAGNOSIS — R2689 Other abnormalities of gait and mobility: Secondary | ICD-10-CM

## 2020-03-02 NOTE — Therapy (Signed)
[facility] [address] [place], [place], [address] Phone: [phone]   Fax:  [phone]  Physical Therapy Treatment  Patient Details  Name: Stanley Wright MRN: [id number] Date of Birth: 03-Jan-1946 Referring Provider (PT): Audrea Muscat Persons, PA-C   Encounter Date: 03/02/2020   PT End of Session - 03/02/20 1209    Visit Number 9    Number of Visits 17    Date for PT Re-Evaluation 70/26/37   90 day cert from 9 wk POC   Authorization Type [employer]    PT Start Time 0806    PT Stop Time 0845    PT Time Calculation (min) 39 min    Activity Tolerance Patient tolerated treatment well    Behavior During Therapy [employer] for tasks assessed/performed           Past Medical History:  Diagnosis Date  . Arthritis   . BENIGN PROSTATIC HYPERTROPHY, WITH OBSTRUCTION   . Cancer (HCC)    skin, basal, squamous  . COLONIC POLYPS   . Diverticulitis   . DVT (deep venous thrombosis) ([place]) 2000  . Dysrhythmia    Hx Afib- 2017  . Early cataract   . Factor 5 Leiden mutation, heterozygous ([place])   . GERD (gastroesophageal reflux disease)    not on medication  . Hearing aid worn    B/L  . HIATAL HERNIA   . ITP (idiopathic thrombocytopenic purpura) 2003  . Long term current use of anticoagulant   . Osteoarthritis    right foot  . Parkinson's disease ([place]) 02/03/2018  . PSORIASIS   . Pulmonary embolus ([place]) 2000  . Shortness of breath dyspnea    at times - Talking alot  . Sleep apnea   . Wears glasses     Past Surgical History:  Procedure Laterality Date  . ANKLE FUSION Right 08/24/2019   Procedure: RIGHT TALO-NAVICULAR FUSION;  Surgeon: Newt Minion, MD;  Location: [facility];  Service: Orthopedics;  Laterality: Right;  . CARDIOVERSION N/A 11/22/2015   Procedure: CARDIOVERSION;  Surgeon: Sanda Klein, MD;  [facility];  Service: Cardiovascular;  Laterality: N/A;  . COLONOSCOPY    . HERNIA REPAIR Left     Inguinal- x2 . mesh x1  . INSERTION OF MESH N/A 02/25/2016   Procedure: INSERTION OF MESH;  Surgeon: Rolm Bookbinder, MD;  Location: [facility];  Service: General;  Laterality: N/A;  . LUNG REMOVAL, PARTIAL Right 2004   was fungal and not cancerous  . PROSTATE SURGERY    . UMBILICAL HERNIA REPAIR N/A 02/25/2016   Procedure: LAPAROSCOPIC UMBILICAL HERNIA REPAIR;  Surgeon: Rolm Bookbinder, MD;  Location: [facility];  Service: General;  Laterality: N/A;    There were no vitals filed for this visit.   Subjective Assessment - 03/02/20 0809    Subjective Was having a pretty good day, and then I fell going out of the house today.  Stepped with the right leg first and feel like it gave wasy through my foot.    Pertinent History R ankle fusion talonavicular/medial cuneiform arthrodesis, DVT/PE 07/2019, pneumonia, sepsis    Limitations Walking    Patient Stated Goals Pt's goals for therapy are to learn how to walk again.    Currently in Pain? Yes    Pain Score 2     Pain Location Ankle    Pain Orientation Right    Pain Descriptors / Indicators Tender;Sore    Pain Type Chronic pain    Pain  Onset In the past 7 days    Pain Frequency Intermittent    Aggravating Factors  walking    Pain Relieving Factors new shoes, moving helps                             [facility] Adult PT Treatment/Exercise - 03/02/20 0001      Ambulation/Gait   Ambulation/Gait Yes    Ambulation/Gait Assistance 5: Supervision    Ambulation/Gait Assistance Details Cues for equal, even step length, slow/deliberate pattern with gait.    Ambulation Distance (Feet) 200 Feet   additional 230 ft   Assistive device None    Gait Pattern Step-through pattern;Decreased arm swing - right;Decreased arm swing - left;Decreased step length - right;Decreased step length - left;Decreased stance time - right;Decreased dorsiflexion - right;Decreased dorsiflexion - left;Right foot flat;Festinating    Ambulation Surface Level;Indoor     Gait Comments Pt able to keep consistent pace and good heelstrike, foot clearance throughout 2 bouts of gait today.      Self-Care   Self-Care Other Self-Care Comments    Other Self-Care Comments  Discussed pt's recent fall (this morning tripping at door threshold walking out of door).  Discussed being deliberate with step lengtha nd foot clearance, trying carrying items (phone, theraband, calendar, etc) in a backpack or drawstring bag to allow his hands to be free for improved stability.         Also discussed if pain suddenly increases or changes with severity or limits typical activities, to please contact MD to get ankle checked out.      Balance Exercises - 03/02/20 0001      Balance Exercises: Standing   Standing Eyes Opened Wide (BOA);Narrow base of support (BOS);Foam/compliant surface;Head turns;5 reps   Head nods, UE support at counter   Standing Eyes Closed Wide (BOA);Narrow base of support (BOS);Foam/compliant surface;1 rep;10 secs   UE support at counter   Stepping Strategy Anterior;Lateral;Foam/compliant surface;UE support;10 reps    Step Ups Forward;Lateral;2 inch;4 inch;UE support 1;UE support 2;Limitations    Step Ups Limitations Single limb step up on RLE 10 reps to 2" step and to 4" step; step up/up, down/down to 4" step, each leg leading x 10 with UE support at counter/chair    Other Standing Exercises Sidestep together R and L, 2 sets x 10 reps, then 3rd set of 10, stepping over 2" obstacle   Cues for slowed pace and to increase step length.   Other Standing Exercises Comments With standing balance exercises at counter, cues for attention to weightbearing through R great toe for full foot contact to avoid excess eversion (pt noted to stand like this bilaterally)             PT Education - 03/02/20 1208    Education Details See self-care    Person(s) Educated Patient    Methods Explanation    Comprehension Verbalized understanding            PT Short Term  Goals - 02/21/20 0847      PT SHORT TERM GOAL #1   Title Pt will be independent with HEP for improved flexibility, strength, balance, transfers, and gait.  TARGET 02/24/2020    Time 5    Period Weeks    Status Achieved      PT SHORT TERM GOAL #2   Title Pt will demonstrate improved flexibility in R ankle dorsiflexion by at least 5 degrees and L ankle  dorsiflexion by at least 3 degrees for improved flexibility for improved gait and decreased pain.    Baseline A/ROM R dorsiflexion -5 degrees, L 4 degrees; AROM R dorsiflexion 5 degrees, L 8 degrees    Time 5    Period Weeks    Status Achieved      PT SHORT TERM GOAL #3   Title Pt will improve TUG score to less than or equal to 13.5 seconds for improved gait efficiency and decreased fall risk.    Baseline 14.25 sec, festination with initiation of gait    Time 5    Period Weeks    Status Partially Met      PT SHORT TERM GOAL #4   Title Pt will improve giat velocity to at least 2.3 ft/sec for improved gait efficiency and safety, with appropriate assistive device.    Baseline 2.5 ft/sec    Time 5    Period Weeks    Status Achieved             PT Long Term Goals - 01/27/20 1454      PT LONG TERM GOAL #1   Title Pt will be independent with progression of HEP for improved flexibility, strength, balance, transfers, and gait.  TARGET 03/23/2020    Time 9    Period Weeks    Status New      PT LONG TERM GOAL #2   Title Pt will rate no more than 2 point increase in pain from baseline, with gait > 5 minutes in duration for improved overall functional mobility.    Baseline increased pain with prolonged walking and standing    Time 9    Period Weeks    Status New      PT LONG TERM GOAL #3   Title Pt will improve TUG cognitive score to less than or equal to 15 seconds for decreased fall risk.    Time 9    Period Weeks    Status New      PT LONG TERM GOAL #4   Title Pt will improve gait velocity to at least 2.62 ft/sec for improved  gait efficiency and safety.    Time 9    Period Weeks    Status New      PT LONG TERM GOAL #5   Title Pt will demonstrate improved R ankle strength to at least 3+/5 for improved balance to prevent falls.    Time 9    Period Weeks    Status New                 Plan - 03/02/20 1209    Clinical Impression Statement Pt had fall stepping out of the house today, catching foot on threshold and feeling like his R foot gave way.  Pt in no overt pain with gentle motion or standing exercises during PT session.  Overall, pt's walking pattern is improved compared to last session.  He will continue to benefit from skilled PT to further address balance, gait and ankle strength.    Personal Factors and Comorbidities Comorbidity 3+    Comorbidities 08/24/2019  R ankle fusion talonavicular/medial cuneiform arthrodesis, DVT/PE 07/2019, pneumonia, sepsis    Examination-Activity Limitations Locomotion Level;Transfers;Stand;Carry    Examination-Participation Restrictions Church;Community Activity;Other   Community exercise   Stability/Clinical Decision Making Evolving/Moderate complexity    Rehab Potential Good    PT Frequency 2x / week    PT Duration Other (comment)   8 weeks plus eval visit =  9 weeks   PT Treatment/Interventions ADLs/Self Care Home Management;DME Instruction;Moist Heat;Gait training;Functional mobility training;Therapeutic activities;Therapeutic exercise;Balance training;Neuromuscular re-education;Manual techniques;Patient/family education;Passive range of motion    PT Next Visit Plan 10th visit progress note; Continue R ankle flexibility and strengthening; continue compliant surface work and gait training (starts/stops/stagger stance position to restart)    Consulted and Agree with Plan of Care Patient           Patient will benefit from skilled therapeutic intervention in order to improve the following deficits and impairments:  Abnormal gait, Decreased range of motion, Difficulty  walking, Impaired tone, Decreased activity tolerance, Pain, Decreased balance, Impaired flexibility, Decreased mobility, Decreased strength  Visit Diagnosis: Other abnormalities of gait and mobility  Unsteadiness on feet     Problem List Patient Active Problem List   Diagnosis Date Noted  . Primary osteoarthritis, right ankle and foot   . Lower extremity edema 08/10/2019  . Acute DVT (deep venous thrombosis) ([facility]) 07/19/2019  . History of pulmonary embolism 07/19/2019  . Parkinson's disease ([place]) 02/03/2018  . Community acquired pneumonia of left lower lobe of lung 12/23/2017  . Sepsis ([facility]) 12/23/2017  . Morbid obesity due to excess calories ([facility]) 08/23/2016  . OSA on CPAP 08/23/2016  . Dyspnea on exertion 08/22/2016  . Umbilical hernia 30/14/1597  . Persistent atrial fibrillation ([place])   . Encounter for therapeutic drug monitoring 08/12/2013  . Long term current use of anticoagulant 07/30/2010  . COLONIC POLYPS 06/03/2010  . Factor V Leiden ([place]) 06/03/2010  . GERD 06/03/2010  . HIATAL HERNIA 06/03/2010  . BENIGN PROSTATIC HYPERTROPHY, WITH OBSTRUCTION 06/03/2010  . PSORIASIS 06/03/2010    Stanley Wedge W. 03/02/2020, 12:13 PM  Stanley Butt., PT   [facility] [address] [place], [place], [address] Phone: [phone]   Fax:  [phone]  Name: Stanley Wright MRN: [id number] Date of Birth: [birth year]

## 2020-03-03 ENCOUNTER — Other Ambulatory Visit: Payer: Self-pay | Admitting: Neurology

## 2020-03-05 ENCOUNTER — Ambulatory Visit (INDEPENDENT_AMBULATORY_CARE_PROVIDER_SITE_OTHER): Payer: PPO | Admitting: *Deleted

## 2020-03-05 ENCOUNTER — Other Ambulatory Visit: Payer: Self-pay

## 2020-03-05 DIAGNOSIS — Z5181 Encounter for therapeutic drug level monitoring: Secondary | ICD-10-CM

## 2020-03-05 DIAGNOSIS — I4819 Other persistent atrial fibrillation: Secondary | ICD-10-CM | POA: Diagnosis not present

## 2020-03-05 DIAGNOSIS — D6851 Activated protein C resistance: Secondary | ICD-10-CM

## 2020-03-05 LAB — POCT INR: INR: 1.8 — AB (ref 2.0–3.0)

## 2020-03-05 MED ORDER — ENOXAPARIN SODIUM 120 MG/0.8ML ~~LOC~~ SOLN: 120.0000 mg | Freq: Two times a day (BID) | SUBCUTANEOUS | 0 refills | Status: DC

## 2020-03-05 NOTE — Progress Notes (Signed)
Pt stated that he had 2 syringes of his lovenox left.   Scr: 1.22, 09/12/2019 Weight: 122 kg  CrCl: 92 ml/min

## 2020-03-05 NOTE — Patient Instructions (Addendum)
Description    Continue Lovenox injections, every 12 hours. Take 2 tablets of warfarin today (9/20) and 1.5 tablets tomorrow (9/21), then continue to take 1 tablet daily except for 1.5 tablets on Monday, Wednesday and Friday. Recheck INR on Friday.

## 2020-03-05 NOTE — Telephone Encounter (Signed)
Rx(s) sent to pharmacy electronically.  

## 2020-03-08 ENCOUNTER — Ambulatory Visit: Payer: PPO | Admitting: Physical Therapy

## 2020-03-08 ENCOUNTER — Other Ambulatory Visit: Payer: Self-pay

## 2020-03-08 DIAGNOSIS — R2681 Unsteadiness on feet: Secondary | ICD-10-CM

## 2020-03-08 DIAGNOSIS — M6281 Muscle weakness (generalized): Secondary | ICD-10-CM

## 2020-03-08 DIAGNOSIS — R2689 Other abnormalities of gait and mobility: Secondary | ICD-10-CM | POA: Diagnosis not present

## 2020-03-08 DIAGNOSIS — M25671 Stiffness of right ankle, not elsewhere classified: Secondary | ICD-10-CM

## 2020-03-08 NOTE — Therapy (Signed)
[facility] [address] [place], [place], [address] Phone: [phone]   Fax:  [phone]  Physical Therapy Treatment/10th Visit Progress Note  Patient Details  Name: Stanley Wright MRN: [id number] Date of Birth: [birth year] Referring Provider (PT): Audrea Muscat Persons, PA-C   Encounter Date: 03/08/2020   PT End of Session - 03/08/20 0807    Visit Number 10    Number of Visits 17    Date for PT Re-Evaluation 29/[birth year]   90 day cert from 9 wk POC   Authorization Type [employer]    PT Start Time 0803    PT Stop Time 0845    PT Time Calculation (min) 42 min    Activity Tolerance Patient tolerated treatment well   Rates pain as 3-4/10 at end of session   Behavior During Therapy [facility] for tasks assessed/performed           Past Medical History:  Diagnosis Date  . Arthritis   . BENIGN PROSTATIC HYPERTROPHY, WITH OBSTRUCTION   . Cancer (HCC)    skin, basal, squamous  . COLONIC POLYPS   . Diverticulitis   . DVT (deep venous thrombosis) ([facility]) 2000  . Dysrhythmia    Hx Afib- 2017  . Early cataract   . Factor 5 Leiden mutation, heterozygous ([facility])   . GERD (gastroesophageal reflux disease)    not on medication  . Hearing aid worn    B/L  . HIATAL HERNIA   . ITP (idiopathic thrombocytopenic purpura) 2003  . Long term current use of anticoagulant   . Osteoarthritis    right foot  . Parkinson's disease ([facility]) 02/03/2018  . PSORIASIS   . Pulmonary embolus ([facility]) 2000  . Shortness of breath dyspnea    at times - Talking alot  . Sleep apnea   . Wears glasses     Past Surgical History:  Procedure Laterality Date  . ANKLE FUSION Right 08/24/2019   Procedure: RIGHT TALO-NAVICULAR FUSION;  Surgeon: Newt Minion, MD;  Location: [facility];  Service: Orthopedics;  Laterality: Right;  . CARDIOVERSION N/A 11/22/2015   Procedure: CARDIOVERSION;  Surgeon: Sanda Klein, MD;  [facility];  Service:  Cardiovascular;  Laterality: N/A;  . COLONOSCOPY    . HERNIA REPAIR Left    Inguinal- x2 . mesh x1  . INSERTION OF MESH N/A 02/25/2016   Procedure: INSERTION OF MESH;  Surgeon: Rolm Bookbinder, MD;  Location: [facility];  Service: General;  Laterality: N/A;  . LUNG REMOVAL, PARTIAL Right 2004   was fungal and not cancerous  . PROSTATE SURGERY    . UMBILICAL HERNIA REPAIR N/A 02/25/2016   Procedure: LAPAROSCOPIC UMBILICAL HERNIA REPAIR;  Surgeon: Rolm Bookbinder, MD;  Location: [facility];  Service: General;  Laterality: N/A;    There were no vitals filed for this visit.   Subjective Assessment - 03/08/20 0806    Subjective Bad morning, as far as connecting my brain to my feet with freezing.  No falls since last week.    Pertinent History R ankle fusion talonavicular/medial cuneiform arthrodesis, DVT/PE 07/2019, pneumonia, sepsis    Limitations Walking    Patient Stated Goals Pt's goals for therapy are to learn how to walk again.    Currently in Pain? No/denies    Pain Onset In the past 7 days                             [facility]  Adult PT Treatment/Exercise - 03/08/20 0808      Ambulation/Gait   Ambulation/Gait Yes    Ambulation/Gait Assistance 5: Supervision    Ambulation/Gait Assistance Details Pt carries walking pole, difficulty with turns/changes of directions today    Ambulation Distance (Feet) 115 Feet   200, additional 115 ft   Assistive device None    Gait Pattern Step-through pattern;Decreased arm swing - right;Decreased arm swing - left;Decreased step length - right;Decreased step length - left;Decreased stance time - right;Decreased dorsiflexion - right;Decreased dorsiflexion - left;Right foot flat;Festinating    Ambulation Surface Level;Indoor    Gait velocity 11.65 sec = 2.82 ft/sec    Pre-Gait Activities At counter standing on red/blue mat surfaces:  forward walking along mats, then stepping off mats to turn (cues for slowed pace, increased time to weigthshift  thorugh hips); repeated x 10 reps.  Standing on foam facing counter:  wide BOS lateral weightshifting, 2 sets x 10 reps with tactile cues to increase stance time/cues to unweight/lift opposite foot.  Beside counter:  marching in place x 10 reps, visual/tactile cue of pt's walking stick for increased high knee marching.  Forward step taps from red mat to floor, x 10 reps, side step taps from red mat to floor, x 10 reps.      Gait Comments Cues with gait on red/blue mat for improved foot clearance, heelstrike, simulating outdoor surfaces.      Timed Up and Go Test   TUG Normal TUG;Cognitive TUG    Normal TUG (seconds) 14.47   14 sec   Cognitive TUG (seconds) 18   13.66     Ankle Exercises: Seated   Heel Raises Both;10 reps    Other Seated Ankle Exercises Seated resisted ankle plantarflexion, RLE 2 sets x 10 reps with red band.  Seated resisted ankle dorsiflexion 2 sets x 10 reps with red band; 2 sets x 10 reps ankle eversion with red band.  Pt reports still challenging with red band; encouraged pt to increase to 3 sets of 10.          Ankle strength testing:  R ankle dorsiflexion 4/5, eversion 4/5, inversion 4/5 P/ROM R ankle dorsiflexion 11 degrees; active ROM R ankle dorsiflexion 5 degrees  Active assisted ankle dorsiflexion/plantarflexion x 10 reps, inversion/eversion x 10 reps          PT Short Term Goals - 02/21/20 0847      PT SHORT TERM GOAL #1   Title Pt will be independent with HEP for improved flexibility, strength, balance, transfers, and gait.  TARGET 02/24/2020    Time 5    Period Weeks    Status Achieved      PT SHORT TERM GOAL #2   Title Pt will demonstrate improved flexibility in R ankle dorsiflexion by at least 5 degrees and L ankle dorsiflexion by at least 3 degrees for improved flexibility for improved gait and decreased pain.    Baseline A/ROM R dorsiflexion -5 degrees, L 4 degrees; AROM R dorsiflexion 5 degrees, L 8 degrees    Time 5    Period Weeks    Status  Achieved      PT SHORT TERM GOAL #3   Title Pt will improve TUG score to less than or equal to 13.5 seconds for improved gait efficiency and decreased fall risk.    Baseline 14.25 sec, festination with initiation of gait    Time 5    Period Weeks    Status Partially Met  PT SHORT TERM GOAL #4   Title Pt will improve giat velocity to at least 2.3 ft/sec for improved gait efficiency and safety, with appropriate assistive device.    Baseline 2.5 ft/sec    Time 5    Period Weeks    Status Achieved             PT Long Term Goals - 03/08/20 1216      PT LONG TERM GOAL #1   Title Pt will be independent with progression of HEP for improved flexibility, strength, balance, transfers, and gait.  TARGET 03/23/2020    Time 9    Period Weeks    Status New      PT LONG TERM GOAL #2   Title Pt will rate no more than 2 point increase in pain from baseline, with gait > 5 minutes in duration for improved overall functional mobility.    Baseline increased pain with prolonged walking and standing    Time 9    Period Weeks    Status New      PT LONG TERM GOAL #3   Title Pt will improve TUG cognitive score to less than or equal to 15 seconds for decreased fall risk.    Time 9    Period Weeks    Status New      PT LONG TERM GOAL #4   Title Pt will improve gait velocity to at least 2.62 ft/sec for improved gait efficiency and safety.    Baseline 2.82 ft/sec 03/08/2020    Time 9    Period Weeks    Status Achieved      PT LONG TERM GOAL #5   Title Pt will demonstrate improved R ankle strength to at least 3+/5 for improved balance to prevent falls.    Baseline 4/5 R ankle eversion, dorsiflexion    Time 9    Period Weeks    Status Achieved                 Plan - 03/08/20 1210    Clinical Impression Statement 10th Visit Progress Note, covering dates 01/27/2020-03/08/2020:  Subjective reports:  Pt feels he is moving better overall, stronger and more confident, than prior to  starting therapy this time.  Objective:  R ankle dorsiflexion 4/5, R ankle eversion 4/5.  Gait velocity 2.82 ft/sec with no device (improved from 2.13 ft/sec at eval).  TUG score 14 sec, TUG cog 18 sec/13.66 sec.  Pt continues to demonstrate episodes of freezing and festination with turns.  Pt is limited at times with gait, due to pain in R foot.  He is progressing towards LTGs and will continue to benefit from skilled PT to further address strength, flexibility, balance,a nd overall gait for improved functional mobility.    Personal Factors and Comorbidities Comorbidity 3+    Comorbidities 08/24/2019  R ankle fusion talonavicular/medial cuneiform arthrodesis, DVT/PE 07/2019, pneumonia, sepsis    Examination-Activity Limitations Locomotion Level;Transfers;Stand;Carry    Examination-Participation Restrictions Church;Community Activity;Other   Community exercise   Stability/Clinical Decision Making Evolving/Moderate complexity    Rehab Potential Good    PT Frequency 2x / week    PT Duration Other (comment)   8 weeks plus eval visit = 9 weeks   PT Treatment/Interventions ADLs/Self Care Home Management;DME Instruction;Moist Heat;Gait training;Functional mobility training;Therapeutic activities;Therapeutic exercise;Balance training;Neuromuscular re-education;Manual techniques;Patient/family education;Passive range of motion    PT Next Visit Plan Continue R ankle flexibility and strengthening; continue compliant surface work and gait training (starts/stops/stagger stance position  to restart)    Consulted and Agree with Plan of Care Patient           Patient will benefit from skilled therapeutic intervention in order to improve the following deficits and impairments:  Abnormal gait, Decreased range of motion, Difficulty walking, Impaired tone, Decreased activity tolerance, Pain, Decreased balance, Impaired flexibility, Decreased mobility, Decreased strength  Visit Diagnosis: Muscle weakness  (generalized)  Stiffness of right ankle, not elsewhere classified  Unsteadiness on feet  Other abnormalities of gait and mobility     Problem List Patient Active Problem List   Diagnosis Date Noted  . Primary osteoarthritis, right ankle and foot   . Lower extremity edema 08/10/2019  . Acute DVT (deep venous thrombosis) ([place]) 07/19/2019  . History of pulmonary embolism 07/19/2019  . Parkinson's disease ([place]) 02/03/2018  . Community acquired pneumonia of left lower lobe of lung 12/23/2017  . Sepsis ([place]) 12/23/2017  . Morbid obesity due to excess calories ([place]) 08/23/2016  . OSA on CPAP 08/23/2016  . Dyspnea on exertion 08/22/2016  . Umbilical hernia 93/24/1991  . Persistent atrial fibrillation ([place])   . Encounter for therapeutic drug monitoring 08/12/2013  . Long term current use of anticoagulant 07/30/2010  . COLONIC POLYPS 06/03/2010  . Factor V Leiden ([place]) 06/03/2010  . GERD 06/03/2010  . HIATAL HERNIA 06/03/2010  . BENIGN PROSTATIC HYPERTROPHY, WITH OBSTRUCTION 06/03/2010  . PSORIASIS 06/03/2010    Jerris Keltz W. 03/08/2020, 12:18 PM Frazier Butt., PT  [facility] [address] [place], [place], [address] Phone: [phone]   Fax:  [phone]  Name: Stanley Wright MRN: [id number] Date of Birth: [birth year]

## 2020-03-09 ENCOUNTER — Ambulatory Visit: Payer: PPO | Admitting: Physical Therapy

## 2020-03-09 ENCOUNTER — Ambulatory Visit (INDEPENDENT_AMBULATORY_CARE_PROVIDER_SITE_OTHER): Payer: PPO

## 2020-03-09 DIAGNOSIS — R2681 Unsteadiness on feet: Secondary | ICD-10-CM

## 2020-03-09 DIAGNOSIS — R2689 Other abnormalities of gait and mobility: Secondary | ICD-10-CM | POA: Diagnosis not present

## 2020-03-09 DIAGNOSIS — Z5181 Encounter for therapeutic drug level monitoring: Secondary | ICD-10-CM

## 2020-03-09 DIAGNOSIS — D6851 Activated protein C resistance: Secondary | ICD-10-CM

## 2020-03-09 DIAGNOSIS — M6281 Muscle weakness (generalized): Secondary | ICD-10-CM

## 2020-03-09 DIAGNOSIS — I4819 Other persistent atrial fibrillation: Secondary | ICD-10-CM

## 2020-03-09 LAB — POCT INR: INR: 3.4 — AB (ref 2.0–3.0)

## 2020-03-09 NOTE — Patient Instructions (Signed)
Description    Stop Lovenox injections.  Take 1/2 tablet today, then resume same dosage 1 tablet daily except for 1.5 tablets on Mondays, Wednesdays and Fridays. Recheck INR in 2 weeks.

## 2020-03-09 NOTE — Therapy (Signed)
[facility] [address] [place], [place], [address] Phone: [phone]   Fax:  [phone]  Physical Therapy Treatment  Patient Details  Name: Stanley Wright MRN: [id number] Date of Birth: [birth year] Referring Provider (PT): Audrea Muscat Persons, PA-C   Encounter Date: 03/09/2020   PT End of Session - 03/09/20 1446    Visit Number 11    Number of Visits 17    Date for PT Re-Evaluation 01/74/94   90 day cert from 9 wk POC   Authorization Type [employer]    PT Start Time 0803    PT Stop Time 0844    PT Time Calculation (min) 41 min    Activity Tolerance Patient tolerated treatment well    Behavior During Therapy [facility] for tasks assessed/performed           Past Medical History:  Diagnosis Date  . Arthritis   . BENIGN PROSTATIC HYPERTROPHY, WITH OBSTRUCTION   . Cancer (HCC)    skin, basal, squamous  . COLONIC POLYPS   . Diverticulitis   . DVT (deep venous thrombosis) ([place]) 2000  . Dysrhythmia    Hx Afib- 2017  . Early cataract   . Factor 5 Leiden mutation, heterozygous ([place])   . GERD (gastroesophageal reflux disease)    not on medication  . Hearing aid worn    B/L  . HIATAL HERNIA   . ITP (idiopathic thrombocytopenic purpura) 2003  . Long term current use of anticoagulant   . Osteoarthritis    right foot  . Parkinson's disease ([place]) 02/03/2018  . PSORIASIS   . Pulmonary embolus ([place]) 2000  . Shortness of breath dyspnea    at times - Talking alot  . Sleep apnea   . Wears glasses     Past Surgical History:  Procedure Laterality Date  . ANKLE FUSION Right 08/24/2019   Procedure: RIGHT TALO-NAVICULAR FUSION;  Surgeon: Newt Minion, MD;  Location: [facility];  Service: Orthopedics;  Laterality: Right;  . CARDIOVERSION N/A 11/22/2015   Procedure: CARDIOVERSION;  Surgeon: Sanda Klein, MD;  [facility];  Service: Cardiovascular;  Laterality: N/A;  . COLONOSCOPY    . HERNIA REPAIR Left     Inguinal- x2 . mesh x1  . INSERTION OF MESH N/A 02/25/2016   Procedure: INSERTION OF MESH;  Surgeon: Rolm Bookbinder, MD;  Location: [facility];  Service: General;  Laterality: N/A;  . LUNG REMOVAL, PARTIAL Right 2004   was fungal and not cancerous  . PROSTATE SURGERY    . UMBILICAL HERNIA REPAIR N/A 02/25/2016   Procedure: LAPAROSCOPIC UMBILICAL HERNIA REPAIR;  Surgeon: Rolm Bookbinder, MD;  Location: [facility];  Service: General;  Laterality: N/A;    There were no vitals filed for this visit.   Subjective Assessment - 03/09/20 0805    Subjective Sometimes it hurts more, sometimes not so much.  Used ice quite a bit yesterday.    Pertinent History R ankle fusion talonavicular/medial cuneiform arthrodesis, DVT/PE 07/2019, pneumonia, sepsis    Limitations Walking    Patient Stated Goals Pt's goals for therapy are to learn how to walk again.    Currently in Pain? Yes    Pain Score 3     Pain Location Foot    Pain Orientation Right;Upper    Pain Descriptors / Indicators Sore;Tender    Pain Onset In the past 7 days    Pain Frequency Intermittent    Aggravating Factors  walking, working it out  Pain Relieving Factors new shoes, moving helps                             [facility] Adult PT Treatment/Exercise - 03/09/20 0807      Ambulation/Gait   Ambulation/Gait Yes    Ambulation/Gait Assistance 5: Supervision    Ambulation/Gait Assistance Details Cues for use of walking pole, especially with turns (as he is demonstrating freezing peisodes with turns; however, pt tends to keep walking pole in air or place it on mat and not fully use it to assist with stability and slowed pace with turns).    Ambulation Distance (Feet) 400 Feet   200, 115   Assistive device None   single walking pole   Gait Pattern Step-through pattern;Decreased arm swing - right;Decreased arm swing - left;Decreased step length - right;Decreased step length - left;Decreased stance time - right;Decreased  dorsiflexion - right;Decreased dorsiflexion - left;Right foot flat;Festinating    Ambulation Surface Level;Indoor    Pre-Gait Activities Reviewed, practiced ways to help offset freezing/festinating episodes with turns or intiiation of gait:  PT cues pt to start BIG step with RLE first, as when he tries to lead with LLE, he notes more festination.  Also, cued pt to hold walking pole steady and use support to SLOW pace of weigthshifting for improved turn.    Gait Comments After initial activities today on red/blue mat surfaces, pt reports feeling like R ankle may give way.  Trialed R ASO brace (pt has one like this at home), with pt unsure if he feels more stable with this or not      High Level Balance   High Level Balance Activities Side stepping;Marching forwards    High Level Balance Comments On red/blue mats near counter with 1 UE support at counter:  forward walking with turn (on solid surface), festinating with turns-see above; sidestepping along mats, R and L x 2 reps, cues for kicking foot out for bigger step lnegth, slowed pace to avoid hastening.  Forward marching x 2 reps along counter.      Knee/Hip Exercises: Standing   Knee Flexion Strengthening;Right;Left;2 sets;10 reps    Hip Flexion Stengthening;Right;Left;2 sets;10 reps;Knee straight;Knee bent;Limitations    Hip Flexion Limitations difficulty initiating L hip forward kicks/L hip/knee flexion    Hip Abduction Stengthening;Right;Left;10 reps;2 sets    Hip Extension Stengthening;Right;Left;2 sets;10 reps    Other Standing Knee Exercises Cues throughout above exercises for upright posture, tall posture through stance hip for glut activation.          Additional cues for standing exercises to slow pace, hold at end range for strenghtening.    NMR: Standing with walking pole, lateral weightshifting x 5 reps, attempt at RLE initiating gait and then return to midline x 3 reps, pt goes quickly     PT Education - 03/09/20 1446     Education Details Trialed ASO on R ankle today; may be beneficial to use to wear to therapy (as pt has one at home) to help with stability on unlevel surfaces.    Person(s) Educated Patient    Methods Explanation    Comprehension Verbalized understanding            PT Short Term Goals - 02/21/20 0847      PT SHORT TERM GOAL #1   Title Pt will be independent with HEP for improved flexibility, strength, balance, transfers, and gait.  TARGET 02/24/2020    Time  5    Period Weeks    Status Achieved      PT SHORT TERM GOAL #2   Title Pt will demonstrate improved flexibility in R ankle dorsiflexion by at least 5 degrees and L ankle dorsiflexion by at least 3 degrees for improved flexibility for improved gait and decreased pain.    Baseline A/ROM R dorsiflexion -5 degrees, L 4 degrees; AROM R dorsiflexion 5 degrees, L 8 degrees    Time 5    Period Weeks    Status Achieved      PT SHORT TERM GOAL #3   Title Pt will improve TUG score to less than or equal to 13.5 seconds for improved gait efficiency and decreased fall risk.    Baseline 14.25 sec, festination with initiation of gait    Time 5    Period Weeks    Status Partially Met      PT SHORT TERM GOAL #4   Title Pt will improve giat velocity to at least 2.3 ft/sec for improved gait efficiency and safety, with appropriate assistive device.    Baseline 2.5 ft/sec    Time 5    Period Weeks    Status Achieved             PT Long Term Goals - 03/08/20 1216      PT LONG TERM GOAL #1   Title Pt will be independent with progression of HEP for improved flexibility, strength, balance, transfers, and gait.  TARGET 03/23/2020    Time 9    Period Weeks    Status New      PT LONG TERM GOAL #2   Title Pt will rate no more than 2 point increase in pain from baseline, with gait > 5 minutes in duration for improved overall functional mobility.    Baseline increased pain with prolonged walking and standing    Time 9    Period Weeks     Status New      PT LONG TERM GOAL #3   Title Pt will improve TUG cognitive score to less than or equal to 15 seconds for decreased fall risk.    Time 9    Period Weeks    Status New      PT LONG TERM GOAL #4   Title Pt will improve gait velocity to at least 2.62 ft/sec for improved gait efficiency and safety.    Baseline 2.82 ft/sec 03/08/2020    Time 9    Period Weeks    Status Achieved      PT LONG TERM GOAL #5   Title Pt will demonstrate improved R ankle strength to at least 3+/5 for improved balance to prevent falls.    Baseline 4/5 R ankle eversion, dorsiflexion    Time 9    Period Weeks    Status Achieved                 Plan - 03/09/20 1447    Clinical Impression Statement Focused today's skilled PT session on standing hip strengthening and gait/balance on compliant surfaces.  Pt continues to have difficulty with turns, with festination/freezing noted with turning around (180).  Despite PT's cues for slowed pace for improved weigthshifting and for more deliberate use of walking pole, pt continues to move at fast pace with continued festination.    Personal Factors and Comorbidities Comorbidity 3+    Comorbidities 08/24/2019  R ankle fusion talonavicular/medial cuneiform arthrodesis, DVT/PE 07/2019, pneumonia, sepsis    Examination-Activity  Limitations Locomotion Level;Transfers;Stand;Carry    Examination-Participation Restrictions Church;Community Activity;Other   Community exercise   Stability/Clinical Decision Making Evolving/Moderate complexity    Rehab Potential Good    PT Frequency 2x / week    PT Duration Other (comment)   8 weeks plus eval visit = 9 weeks   PT Treatment/Interventions ADLs/Self Care Home Management;DME Instruction;Moist Heat;Gait training;Functional mobility training;Therapeutic activities;Therapeutic exercise;Balance training;Neuromuscular re-education;Manual techniques;Patient/family education;Passive range of motion    PT Next Visit Plan  Continue R ankle flexibility and strengthening; continue compliant surface work (trying to focus on ankle strengthening on compliant surfaces) and gait training (starts/stops/stagger stance position to restart) and turns; how did it go to try to focus on leading with R foot first?    Consulted and Agree with Plan of Care Patient           Patient will benefit from skilled therapeutic intervention in order to improve the following deficits and impairments:  Abnormal gait, Decreased range of motion, Difficulty walking, Impaired tone, Decreased activity tolerance, Pain, Decreased balance, Impaired flexibility, Decreased mobility, Decreased strength  Visit Diagnosis: Other abnormalities of gait and mobility  Muscle weakness (generalized)  Unsteadiness on feet     Problem List Patient Active Problem List   Diagnosis Date Noted  . Primary osteoarthritis, right ankle and foot   . Lower extremity edema 08/10/2019  . Acute DVT (deep venous thrombosis) ([place]) 07/19/2019  . History of pulmonary embolism 07/19/2019  . Parkinson's disease ([place]) 02/03/2018  . Community acquired pneumonia of left lower lobe of lung 12/23/2017  . Sepsis ([place]) 12/23/2017  . Morbid obesity due to excess calories ([place]) 08/23/2016  . OSA on CPAP 08/23/2016  . Dyspnea on exertion 08/22/2016  . Umbilical hernia 97/74/1423  . Persistent atrial fibrillation ([place])   . Encounter for therapeutic drug monitoring 08/12/2013  . Long term current use of anticoagulant 07/30/2010  . COLONIC POLYPS 06/03/2010  . Factor V Leiden ([place]) 06/03/2010  . GERD 06/03/2010  . HIATAL HERNIA 06/03/2010  . BENIGN PROSTATIC HYPERTROPHY, WITH OBSTRUCTION 06/03/2010  . PSORIASIS 06/03/2010    Georgeann Brinkman W. 03/09/2020, 2:52 PM  Frazier Butt., PT   [facility] [address] [place], [place], [address] Phone: [phone]   Fax:  [phone]  Name: Stanley Wright MRN:  [id number] Date of Birth: Jan 29, 1946

## 2020-03-12 ENCOUNTER — Ambulatory Visit: Payer: PPO | Admitting: Physical Therapy

## 2020-03-12 ENCOUNTER — Other Ambulatory Visit: Payer: Self-pay

## 2020-03-12 DIAGNOSIS — R2689 Other abnormalities of gait and mobility: Secondary | ICD-10-CM | POA: Diagnosis not present

## 2020-03-12 DIAGNOSIS — R2681 Unsteadiness on feet: Secondary | ICD-10-CM

## 2020-03-12 DIAGNOSIS — M6281 Muscle weakness (generalized): Secondary | ICD-10-CM

## 2020-03-12 NOTE — Therapy (Signed)
[facility] [address] [place], [place], [address] Phone: [phone]   Fax:  [phone]  Physical Therapy Treatment  Patient Details  Name: Stanley Wright MRN: [id number] Date of Birth: 12-24-1945 Referring Provider (PT): Audrea Muscat Persons, PA-C   Encounter Date: 03/12/2020   PT End of Session - 03/12/20 0855    Visit Number 12    Number of Visits 17    Date for PT Re-Evaluation 32/95/18   90 day cert from 9 wk POC   Authorization Type [employer]    PT Start Time 0718    PT Stop Time 0758    PT Time Calculation (min) 40 min    Activity Tolerance Patient tolerated treatment well    Behavior During Therapy [facility] for tasks assessed/performed           Past Medical History:  Diagnosis Date   Arthritis    BENIGN PROSTATIC HYPERTROPHY, WITH OBSTRUCTION    Cancer ([place])    skin, basal, squamous   COLONIC POLYPS    Diverticulitis    DVT (deep venous thrombosis) ([place]) 2000   Dysrhythmia    Hx Afib- 2017   Early cataract    Factor 5 Leiden mutation, heterozygous ([place])    GERD (gastroesophageal reflux disease)    not on medication   Hearing aid worn    B/L   HIATAL HERNIA    ITP (idiopathic thrombocytopenic purpura) 2003   Long term current use of anticoagulant    Osteoarthritis    right foot   Parkinson's disease ([place]) 02/03/2018   PSORIASIS    Pulmonary embolus ([place]) 2000   Shortness of breath dyspnea    at times - Talking alot   Sleep apnea    Wears glasses     Past Surgical History:  Procedure Laterality Date   ANKLE FUSION Right 08/24/2019   Procedure: RIGHT TALO-NAVICULAR FUSION;  Surgeon: Newt Minion, MD;  Location: [facility];  Service: Orthopedics;  Laterality: Right;   CARDIOVERSION N/A 11/22/2015   Procedure: CARDIOVERSION;  Surgeon: Sanda Klein, MD;  Location: [facility];  Service: Cardiovascular;  Laterality: N/A;   COLONOSCOPY     HERNIA REPAIR Left     Inguinal- x2 . mesh x1   INSERTION OF MESH N/A 02/25/2016   Procedure: INSERTION OF MESH;  Surgeon: Rolm Bookbinder, MD;  Location: [facility];  Service: General;  Laterality: N/A;   LUNG REMOVAL, PARTIAL Right 2004   was fungal and not cancerous   PROSTATE SURGERY     UMBILICAL HERNIA REPAIR N/A 02/25/2016   Procedure: LAPAROSCOPIC UMBILICAL HERNIA REPAIR;  Surgeon: Rolm Bookbinder, MD;  Location: [facility];  Service: General;  Laterality: N/A;    There were no vitals filed for this visit.   Subjective Assessment - 03/12/20 0719    Subjective Pain went down over the weekend, everything else about the same.  Did not use the brace over the weekend; did practice starting with the right foot; it does seem to help.    Pertinent History R ankle fusion talonavicular/medial cuneiform arthrodesis, DVT/PE 07/2019, pneumonia, sepsis    Limitations Walking    Patient Stated Goals Pt's goals for therapy are to learn how to walk again.    Currently in Pain? Yes    Pain Score 2     Pain Location Foot    Pain Orientation Right;Upper    Pain Descriptors / Indicators Sore;Tender    Pain Type Chronic pain    Pain  Onset In the past 7 days    Pain Frequency Intermittent    Aggravating Factors  walking, working it    Pain Relieving Factors over time, it goes away                             [facility] Adult PT Treatment/Exercise - 03/12/20 0001      Neuro Re-ed    Neuro Re-ed Details  NMR for activation of ankle musculature, standing on incline and decline of ramp in clinic:  feet apart with head turns/head nods x 5 reps; EC head steady x 10 sec., progressed to partial stance with head turns/head nods x 5 reps each, then EC head steady x 10 sec.  Pt uses his walking pole in front of him for support, able to progress with cues to 1 UE support      Knee/Hip Exercises: Standing   Knee Flexion Strengthening;Right;Left;2 sets;10 reps    Hip Flexion Stengthening;Right;Left;2 sets;10 reps;Knee  straight;Knee bent;Limitations    Hip Flexion Limitations Better initiation today, especially with alternating sides    Hip Abduction Stengthening;Right;Left;10 reps;2 sets    Hip Extension Stengthening;Right;Left;2 sets;10 reps    Other Standing Knee Exercises Cues throughout above exercises for upright posture, tall posture through stance hip for glut activation.      Ankle Exercises: Seated   Heel Raises Both;10 reps    Other Seated Ankle Exercises Seated resisted ankle plantarflexion, RLE 3 sets x 10 reps with red band.  Seated resisted ankle dorsiflexion 3 sets x 10 reps with red band; 3 sets x 10 reps ankle eversion with red band.  PT encouraged pt again, to increase to 3 sets of 10.  Isometric R ankle eversion against weighted ball held by PT, 2 sets x 10 reps.                    PT Short Term Goals - 02/21/20 0847      PT SHORT TERM GOAL #1   Title Pt will be independent with HEP for improved flexibility, strength, balance, transfers, and gait.  TARGET 02/24/2020    Time 5    Period Weeks    Status Achieved      PT SHORT TERM GOAL #2   Title Pt will demonstrate improved flexibility in R ankle dorsiflexion by at least 5 degrees and L ankle dorsiflexion by at least 3 degrees for improved flexibility for improved gait and decreased pain.    Baseline A/ROM R dorsiflexion -5 degrees, L 4 degrees; AROM R dorsiflexion 5 degrees, L 8 degrees    Time 5    Period Weeks    Status Achieved      PT SHORT TERM GOAL #3   Title Pt will improve TUG score to less than or equal to 13.5 seconds for improved gait efficiency and decreased fall risk.    Baseline 14.25 sec, festination with initiation of gait    Time 5    Period Weeks    Status Partially Met      PT SHORT TERM GOAL #4   Title Pt will improve giat velocity to at least 2.3 ft/sec for improved gait efficiency and safety, with appropriate assistive device.    Baseline 2.5 ft/sec    Time 5    Period Weeks    Status  Achieved             PT Long Term Goals - 03/08/20 1216  PT LONG TERM GOAL #1   Title Pt will be independent with progression of HEP for improved flexibility, strength, balance, transfers, and gait.  TARGET 03/23/2020    Time 9    Period Weeks    Status New      PT LONG TERM GOAL #2   Title Pt will rate no more than 2 point increase in pain from baseline, with gait > 5 minutes in duration for improved overall functional mobility.    Baseline increased pain with prolonged walking and standing    Time 9    Period Weeks    Status New      PT LONG TERM GOAL #3   Title Pt will improve TUG cognitive score to less than or equal to 15 seconds for decreased fall risk.    Time 9    Period Weeks    Status New      PT LONG TERM GOAL #4   Title Pt will improve gait velocity to at least 2.62 ft/sec for improved gait efficiency and safety.    Baseline 2.82 ft/sec 03/08/2020    Time 9    Period Weeks    Status Achieved      PT LONG TERM GOAL #5   Title Pt will demonstrate improved R ankle strength to at least 3+/5 for improved balance to prevent falls.    Baseline 4/5 R ankle eversion, dorsiflexion    Time 9    Period Weeks    Status Achieved                 Plan - 03/12/20 0856    Clinical Impression Statement Skilled PT session today focused on ankle strengthening, hip strengthening and balance on incline/decline surfaces for ankle muscle activation.  Pt performs exercises well throughout session, with 1-2/10 increase in pain rating on top of R foot at end of session.  He is incorporating strategy to lead with RLE first upon stopping/initiating gait, with less initial hesitation noted.  He will continue to benefit from skilled PT to further address strength, balance, gait for improved overall functional mobility.    Personal Factors and Comorbidities Comorbidity 3+    Comorbidities 08/24/2019  R ankle fusion talonavicular/medial cuneiform arthrodesis, DVT/PE 07/2019,  pneumonia, sepsis    Examination-Activity Limitations Locomotion Level;Transfers;Stand;Carry    Examination-Participation Restrictions Church;Community Activity;Other   Community exercise   Stability/Clinical Decision Making Evolving/Moderate complexity    Rehab Potential Good    PT Frequency 2x / week    PT Duration Other (comment)   8 weeks plus eval visit = 9 weeks   PT Treatment/Interventions ADLs/Self Care Home Management;DME Instruction;Moist Heat;Gait training;Functional mobility training;Therapeutic activities;Therapeutic exercise;Balance training;Neuromuscular re-education;Manual techniques;Patient/family education;Passive range of motion    PT Next Visit Plan Continue R ankle flexibility and strengthening; continue compliant surface/incline and decline surface work (trying to focus on ankle strengthening on compliant surfaces) and gait training (starts/stops/stagger stance position to restart) and turns, working towards [employer] and Agree with Plan of Care Patient           Patient will benefit from skilled therapeutic intervention in order to improve the following deficits and impairments:  Abnormal gait, Decreased range of motion, Difficulty walking, Impaired tone, Decreased activity tolerance, Pain, Decreased balance, Impaired flexibility, Decreased mobility, Decreased strength  Visit Diagnosis: Muscle weakness (generalized)  Unsteadiness on feet  Other abnormalities of gait and mobility     Problem List Patient Active Problem List   Diagnosis Date Noted  Primary osteoarthritis, right ankle and foot    Lower extremity edema 08/10/2019   Acute DVT (deep venous thrombosis) ([place]) 07/19/2019   History of pulmonary embolism 07/19/2019   Parkinson's disease ([place]) 02/03/2018   Community acquired pneumonia of left lower lobe of lung 12/23/2017   Sepsis ([place]) 12/23/2017   Morbid obesity due to excess calories ([place]) 08/23/2016   OSA on CPAP 08/23/2016    Dyspnea on exertion 12/14/1599   Umbilical hernia 09/32/3557   Persistent atrial fibrillation ([place])    Encounter for therapeutic drug monitoring 08/12/2013   Long term current use of anticoagulant 07/30/2010   COLONIC POLYPS 06/03/2010   Factor V Leiden ([place]) 06/03/2010   GERD 06/03/2010   HIATAL HERNIA 06/03/2010   BENIGN PROSTATIC HYPERTROPHY, WITH OBSTRUCTION 06/03/2010   PSORIASIS 06/03/2010    Zaydrian Batta W. 03/12/2020, 9:00 AM  Frazier Butt., PT   [facility] [address] [place], [place], [address] Phone: [phone]   Fax:  [phone]  Name: Stanley Wright MRN: [id number] Date of Birth: Nov 27, 1945

## 2020-03-14 NOTE — Progress Notes (Signed)
Assessment/Plan:   1.   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   -having trouble with first morning on and noting wearing off.  We will give samples of inbrija.  Pt shown how to use that today.  He will call me for a RX if the samples work.  R/B/SE were discussed.  The opportunity to ask questions was given and they were answered to the best of my ability.  The patient expressed understanding and willingness to follow the outlined treatment protocols.  -May do on/off test in the future but can't do right now because he still doesn't feel completely healed from R foot surgery.  However, looks significantly more parkinsonian today than in past and noting wearing off of drug now.   Want completely healed before attempt levodopa challenge if needed  2. Cervical dystonia -Patient wants to hold on Botox.  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  4.dysphagia -mild. Doesn't want to do MBE at this time  5.  Atrial fibrillation             -Following with cardiology.  On diltiazem.  6.  Factor V Leiden             -On Coumadin.   Subjective:   Stanley Wright was seen today in follow up for Parkinsons disease.  My previous records were reviewed prior to todays visit as well as outside records available to me. Pt falling - 3-4 since first of year - R foot isn't "strong."  Pt denies lightheadedness, near syncope.  No hallucinations.  Mood has been good.  I have reviewed physical therapy records from neuro rehab.  Pt noting trouble with off times.  Trouble with  first morning "on."    Current prescribed movement disorder medications: Carbidopa/levodopa 25/100, 1 tablet 5 times per day  - 6am/10am/2pm/6pm per patient (not taking middle of the night dosage but c/o fact that carbidopa/levodopa 50/200 is not working but 1/2 the night) Carbidopa/levodopa to 50/200 at bedtime   PREVIOUS MEDICATIONS: Sinemet and Sinemet CR  ALLERGIES:   Allergies  Allergen Reactions  . Tylenol [Acetaminophen] Other (See Comments)    Pt states he had a DNA test completed that stated he should never take tylenol.    CURRENT MEDICATIONS:  Outpatient Encounter Medications as of 03/19/2020  Medication Sig  . carbidopa-levodopa (SINEMET CR) 50-200 MG tablet TAKE 1 TABLET BY MOUTH AT BEDTIME  . carbidopa-levodopa (SINEMET IR) 25-100 MG tablet TAKE 1 TABLET BY MOUTH FOUR TO  FIVE TIMES DAILY  . CARTIA XT 300 MG 24 hr capsule Take 1 capsule by mouth once daily  . furosemide (LASIX) 40 MG tablet Take 2 tablets (80 mg) on Monday, Wednesday, Friday; 1 tablet (40 mg) all other days. (Patient taking differently: Take 40-80 mg by mouth See admin instructions. Take 80 mg on Monday, Wednesday, Friday and 40 mg all other days.)  . warfarin (COUMADIN) 5 MG tablet TAKE 1 TO 1.5 TABLETS BY MOUTH ONCE DAILY OR  AS  DIRECTED  BY  COUMADIN  CLINIC  . [DISCONTINUED] enoxaparin (LOVENOX) 120 MG/0.8ML  injection Inject 0.8 mLs (120 mg total) into the skin every 12 (twelve) hours. 6 syringes (Patient not taking: Reported on 03/19/2020)   No facility-administered encounter medications on file as of 03/19/2020.    Objective:   PHYSICAL EXAMINATION:    VITALS:   Vitals:   03/19/20 0749  BP: 130/81  Pulse: 63  SpO2: 94%  Weight: 272 lb (123.4 kg)  Height: 6' (1.829 m)    GEN:  The patient appears stated age and is in NAD. HEENT:  Normocephalic, atraumatic.  The mucous membranes are moist. The superficial temporal arteries are without ropiness or tenderness. CV:  irreg irreg Lungs:   CTAB Neck/HEME:  There are no carotid bruits bilaterally.  Neurological examination:  Orientation: The patient is alert and oriented x3. Cranial nerves: There is good facial symmetry with facial hypomimia. The speech is fluent and clear. Soft palate rises symmetrically and there is no tongue deviation. Hearing is intact to conversational tone. Sensation: Sensation is intact to light touch throughout Motor: Strength is at least antigravity x4.  Movement examination: Tone: There is nl tone in the UE Abnormal movements: none Coordination:  There is decremation with RAM's, with any form of RAMS, including alternating supination and pronation of the forearm, hand opening and closing, finger taps, heel taps and toe taps on the L>R Gait and Station: The patient has  difficulty arising out of a deep-seated chair without the use of the hands. The patient's stride length is decreased.  He is wide based and short stepped.  He has trouble in the turns.    I have reviewed and interpreted the following labs independently    Chemistry      Component Value Date/Time   NA 143 09/12/2019 1455   K 4.9 09/12/2019 1455   CL 104 09/12/2019 1455   CO2 25 09/12/2019 1455   BUN 24 09/12/2019 1455   CREATININE 1.22 09/12/2019 1455   CREATININE 0.88 06/12/2015 1505      Component Value Date/Time   CALCIUM 9.5 09/12/2019 1455   ALKPHOS 62 01/17/2019 1321   AST 12 01/17/2019 1321   ALT 6 01/17/2019 1321   BILITOT 0.4 01/17/2019 1321       Lab Results  Component Value Date   WBC 4.7 08/24/2019   HGB 14.1 08/24/2019   HCT 43.7 08/24/2019   MCV 100.2 (H) 08/24/2019   PLT 169 08/24/2019    Lab Results  Component Value Date   TSH 2.88 10/08/2015     Total time spent on today's visit was 40 minutes, including both face-to-face time and nonface-to-face time.  Time included that spent on review of records (prior notes available to me/labs/imaging if pertinent), discussing treatment and goals,  answering patient's questions and coordinating care.  Cc:  Scifres, Dorothy, [employer]

## 2020-03-16 ENCOUNTER — Ambulatory Visit: Payer: PPO | Attending: Neurology | Admitting: Physical Therapy

## 2020-03-16 ENCOUNTER — Other Ambulatory Visit: Payer: Self-pay

## 2020-03-16 DIAGNOSIS — M6281 Muscle weakness (generalized): Secondary | ICD-10-CM | POA: Diagnosis not present

## 2020-03-16 DIAGNOSIS — R2681 Unsteadiness on feet: Secondary | ICD-10-CM | POA: Diagnosis not present

## 2020-03-16 DIAGNOSIS — R2689 Other abnormalities of gait and mobility: Secondary | ICD-10-CM | POA: Diagnosis not present

## 2020-03-16 DIAGNOSIS — M25671 Stiffness of right ankle, not elsewhere classified: Secondary | ICD-10-CM | POA: Diagnosis not present

## 2020-03-16 NOTE — Therapy (Addendum)
[facility] [address] [place], [place], [address] Phone: [phone]   Fax:  [phone]  Physical Therapy Treatment  Patient Details  Name: Stanley Wright MRN: [id number] Date of Birth: 1946/05/01 Referring Provider (PT): Audrea Muscat Persons, PA-C   Encounter Date: 03/16/2020   PT End of Session - 03/16/20 1422    Visit Number 13    Number of Visits 17    Date for PT Re-Evaluation 73/71/06   90 day cert from 9 wk POC   Authorization Type [employer]    PT Start Time 0750    PT Stop Time 0846    PT Time Calculation (min) 56 min    Activity Tolerance Patient tolerated treatment well    Behavior During Therapy [facility] for tasks assessed/performed           Past Medical History:  Diagnosis Date  . Arthritis   . BENIGN PROSTATIC HYPERTROPHY, WITH OBSTRUCTION   . Cancer (HCC)    skin, basal, squamous  . COLONIC POLYPS   . Diverticulitis   . DVT (deep venous thrombosis) ([place]) 2000  . Dysrhythmia    Hx Afib- 2017  . Early cataract   . Factor 5 Leiden mutation, heterozygous ([place])   . GERD (gastroesophageal reflux disease)    not on medication  . Hearing aid worn    B/L  . HIATAL HERNIA   . ITP (idiopathic thrombocytopenic purpura) 2003  . Long term current use of anticoagulant   . Osteoarthritis    right foot  . Parkinson's disease ([place]) 02/03/2018  . PSORIASIS   . Pulmonary embolus ([place]) 2000  . Shortness of breath dyspnea    at times - Talking alot  . Sleep apnea   . Wears glasses     Past Surgical History:  Procedure Laterality Date  . ANKLE FUSION Right 08/24/2019   Procedure: RIGHT TALO-NAVICULAR FUSION;  Surgeon: Newt Minion, MD;  Location: [facility];  Service: Orthopedics;  Laterality: Right;  . CARDIOVERSION N/A 11/22/2015   Procedure: CARDIOVERSION;  Surgeon: Sanda Klein, MD;  Location: [facility];  Service: Cardiovascular;  Laterality: N/A;  . COLONOSCOPY    . HERNIA REPAIR Left     Inguinal- x2 . mesh x1  . INSERTION OF MESH N/A 02/25/2016   Procedure: INSERTION OF MESH;  Surgeon: Rolm Bookbinder, MD;  Location: [facility];  Service: General;  Laterality: N/A;  . LUNG REMOVAL, PARTIAL Right 2004   was fungal and not cancerous  . PROSTATE SURGERY    . UMBILICAL HERNIA REPAIR N/A 02/25/2016   Procedure: LAPAROSCOPIC UMBILICAL HERNIA REPAIR;  Surgeon: Rolm Bookbinder, MD;  Location: [facility];  Service: General;  Laterality: N/A;    There were no vitals filed for this visit.   Subjective Assessment - 03/16/20 0753    Subjective Still hurts just under my shoe laces on R foot.    Pertinent History R ankle fusion talonavicular/medial cuneiform arthrodesis, DVT/PE 07/2019, pneumonia, sepsis    Limitations Walking    Patient Stated Goals Pt's goals for therapy are to learn how to walk again.    Currently in Pain? Yes    Pain Score 3     Pain Location Foot    Pain Orientation Right    Pain Descriptors / Indicators Sharp;Tender   Sensitive   Pain Onset --    Pain Frequency Intermittent    Aggravating Factors  walking, working on it    Pain Relieving Factors just depends-sometimes worse  than others                             [facility] PT Treatment/Exercise - 03/16/20 0001      Therapeutic Activites    Therapeutic Activities Other Therapeutic Activities    Other Therapeutic Activities Discussed ways to desensitize area on superior aspect of foot, where pt describes as sensitive to touch.  Demonstrated how to prop foot at chair in front of him for gentle massage to area, and discussed use of varied textures/ice/heat to help with desensitization       Neuro Re-ed    Neuro Re-ed Details  NMR for activation of ankle musculature, standing on incline and decline of ramp in clinic:  feet apart with head turns/head nods x 5 reps; EC head steady x 10 sec., EC with head turns/nods x10.  No device for support today with min guard assistance.      Knee/Hip Exercises:  Stretches   Press photographer Right;3 reps   15 seconds    Gastroc Stretch Limitations Runner's stretch position      Knee/Hip Exercises: Standing   Knee Flexion Strengthening;Right;Left;3 sets;10 reps    Knee Flexion Limitations Cues for slowed pace    Hip Flexion Stengthening;Right;Left;2 sets;10 reps;Knee straight;Knee bent;Limitations    Hip Flexion Limitations Difficulty with initial initiation of LLE    Hip Abduction Stengthening;Right;Left;10 reps;3 sets    Abduction Limitations Cues for timing and coordination, slowed pace/3 sec hold of exercises    Hip Extension Stengthening;Right;Left;10 reps;3 sets    Extension Limitations Cues for timing and coordination, slowed pace/3 second hold                                                                 Manual Therapy  Manual Therapy Joint mobilization  Joint Mobilization Talocrural distraction grade III, AP & PA grade III talus mob, grade II to III subtalar medial/lateral glides, distal tibiofibular AP & PA grade II to III mobs, dorsiflexion MWM x10 in standing (initial soreness w/ distraction; improved w/ AP talus mo)        PT Education - 03/16/20 1420    Education Details Addition to HEP; joint mobilization to help with motion, pain relief; desensitization techniques to superior aspect of R foot    Person(s) Educated Patient    Methods Explanation;Demonstration;Handout    Comprehension Verbalized understanding;Returned demonstration;Verbal cues required            PT Short Term Goals - 02/21/20 0847      PT SHORT TERM GOAL #1   Title Pt will be independent with HEP for improved flexibility, strength, balance, transfers, and gait.  TARGET 02/24/2020    Time 5    Period Weeks    Status Achieved      PT SHORT TERM GOAL #2   Title Pt will demonstrate improved flexibility in R ankle dorsiflexion by at least 5 degrees and L ankle dorsiflexion by at least 3 degrees for improved flexibility for improved  gait and decreased pain.    Baseline A/ROM R dorsiflexion -5 degrees, L 4 degrees; AROM R dorsiflexion 5 degrees, L 8 degrees    Time 5    Period Weeks    Status Achieved  PT SHORT TERM GOAL #3   Title Pt will improve TUG score to less than or equal to 13.5 seconds for improved gait efficiency and decreased fall risk.    Baseline 14.25 sec, festination with initiation of gait    Time 5    Period Weeks    Status Partially Met      PT SHORT TERM GOAL #4   Title Pt will improve giat velocity to at least 2.3 ft/sec for improved gait efficiency and safety, with appropriate assistive device.    Baseline 2.5 ft/sec    Time 5    Period Weeks    Status Achieved             PT Long Term Goals - 03/08/20 1216      PT LONG TERM GOAL #1   Title Pt will be independent with progression of HEP for improved flexibility, strength, balance, transfers, and gait.  TARGET 03/23/2020    Time 9    Period Weeks    Status New      PT LONG TERM GOAL #2   Title Pt will rate no more than 2 point increase in pain from baseline, with gait > 5 minutes in duration for improved overall functional mobility.    Baseline increased pain with prolonged walking and standing    Time 9    Period Weeks    Status New      PT LONG TERM GOAL #3   Title Pt will improve TUG cognitive score to less than or equal to 15 seconds for decreased fall risk.    Time 9    Period Weeks    Status New      PT LONG TERM GOAL #4   Title Pt will improve gait velocity to at least 2.62 ft/sec for improved gait efficiency and safety.    Baseline 2.82 ft/sec 03/08/2020    Time 9    Period Weeks    Status Achieved      PT LONG TERM GOAL #5   Title Pt will demonstrate improved R ankle strength to at least 3+/5 for improved balance to prevent falls.    Baseline 4/5 R ankle eversion, dorsiflexion    Time 9    Period Weeks    Status Achieved                 Plan - 03/16/20 1415    Clinical Impression Statement  Continued to work on standing hip and knee strengthening and ankle strengthening on ramp/inline and decline surfaces.  Pt c/o more pain today than last session with standing and short distance gait.  Supplemental PT, April, present during session and assists with introducing job mobilizations to assist with flexibility and decreased pain in R foot and ankle, with pt noting relief at end of session.  Pt may benefit from continued manual therapy to assist with pain and flexibility in R ankle and foot.  Will begin checking LTGs next visit and discuss POC.    Personal Factors and Comorbidities Comorbidity 3+    Comorbidities 08/24/2019  R ankle fusion talonavicular/medial cuneiform arthrodesis, DVT/PE 07/2019, pneumonia, sepsis    Examination-Activity Limitations Locomotion Level;Transfers;Stand;Carry    Examination-Participation Restrictions Church;Community Activity;Other   Community exercise   Stability/Clinical Decision Making Evolving/Moderate complexity    Rehab Potential Good    PT Frequency 2x / week    PT Duration Other (comment)   8 weeks plus eval visit = 9 weeks   PT Treatment/Interventions ADLs/Self Care Home  Management;DME Instruction;Moist Heat;Gait training;Functional mobility training;Therapeutic activities;Therapeutic exercise;Balance training;Neuromuscular re-education;Manual techniques;Patient/family education;Passive range of motion    PT Next Visit Plan Begin checking LTGs and discuss POC (renew of discharge).  Consider continuing manual therapy for joint mobs to R ankle/foot and check to see how pt has done with additional stretch at home    Consulted and Agree with Plan of Care Patient           Patient will benefit from skilled therapeutic intervention in order to improve the following deficits and impairments:  Abnormal gait, Decreased range of motion, Difficulty walking, Impaired tone, Decreased activity tolerance, Pain, Decreased balance, Impaired flexibility, Decreased  mobility, Decreased strength  Visit Diagnosis: Muscle weakness (generalized)  Unsteadiness on feet  Other abnormalities of gait and mobility  Stiffness of right ankle, not elsewhere classified     Problem List Patient Active Problem List   Diagnosis Date Noted  . Primary osteoarthritis, right ankle and foot   . Lower extremity edema 08/10/2019  . Acute DVT (deep venous thrombosis) ([place]) 07/19/2019  . History of pulmonary embolism 07/19/2019  . Parkinson's disease ([place]) 02/03/2018  . Community acquired pneumonia of left lower lobe of lung 12/23/2017  . Sepsis ([place]) 12/23/2017  . Morbid obesity due to excess calories ([place]) 08/23/2016  . OSA on CPAP 08/23/2016  . Dyspnea on exertion 08/22/2016  . Umbilical hernia 36/68/1594  . Persistent atrial fibrillation ([name])   . Encounter for therapeutic drug monitoring 08/12/2013  . Long term current use of anticoagulant 07/30/2010  . COLONIC POLYPS 06/03/2010  . Factor V Leiden ([place]) 06/03/2010  . GERD 06/03/2010  . HIATAL HERNIA 06/03/2010  . BENIGN PROSTATIC HYPERTROPHY, WITH OBSTRUCTION 06/03/2010  . PSORIASIS 06/03/2010    Romaine Maciolek W. 03/16/2020, 2:29 PM Frazier Butt., PT   Dyanne Carrel Gordy Levan, PT, DPT 03/16/2020, 2:38 PM  [facility] [address] [place], [place], [address] Phone: [phone]   Fax:  [phone]  Name: Stanley Wright MRN: [id number] Date of Birth: Jul 12, 1945

## 2020-03-16 NOTE — Patient Instructions (Signed)
Access Code: [email or web address] URL: [email or web address] Date: 03/16/2020 Prepared by: Mady Haagensen  Exercises Seated Ankle Dorsiflexion AROM - 1-2 x daily - 5 x weekly - 1-2 sets - 10 reps - 3 sec hold Seated Heel Raise - 1-2 x daily - 5 x weekly - 1-2 sets - 10 reps - 3 sec hold Seated Ankle Eversion AROM - 1-2 x daily - 5 x weekly - 1-2 sets - 10 reps - 3 sec hold Seated Ankle Dorsiflexion with Resistance - 1 x daily - 5 x weekly - 2-3 sets - 10 reps - 3 sec hold Seated Ankle Eversion with Resistance - 1 x daily - 5 x weekly - 2-3 sets - 10 reps - 3 sec hold Seated Ankle Plantar Flexion with Resistance Loop - 1 x daily - 5 x weekly - 2-3 sets - 10 reps Staggered Stance Forward Backward Weight Shift with Counter Support - 1 x daily - 7 x weekly - 2 sets - 10 reps Standing Gastroc Stretch at Counter - 2 x daily - 7 x weekly - 1 sets - 3 reps - 30 sec hold

## 2020-03-19 ENCOUNTER — Ambulatory Visit: Payer: PPO | Admitting: Physical Therapy

## 2020-03-19 ENCOUNTER — Encounter: Payer: Self-pay | Admitting: Neurology

## 2020-03-19 ENCOUNTER — Other Ambulatory Visit: Payer: Self-pay

## 2020-03-19 ENCOUNTER — Ambulatory Visit (INDEPENDENT_AMBULATORY_CARE_PROVIDER_SITE_OTHER): Payer: PPO | Admitting: Neurology

## 2020-03-19 VITALS — BP 130/81 | HR 63 | Ht 72.0 in | Wt 272.0 lb

## 2020-03-19 DIAGNOSIS — R2689 Other abnormalities of gait and mobility: Secondary | ICD-10-CM

## 2020-03-19 DIAGNOSIS — G232 Striatonigral degeneration: Secondary | ICD-10-CM | POA: Diagnosis not present

## 2020-03-19 DIAGNOSIS — M6281 Muscle weakness (generalized): Secondary | ICD-10-CM | POA: Diagnosis not present

## 2020-03-19 DIAGNOSIS — M25671 Stiffness of right ankle, not elsewhere classified: Secondary | ICD-10-CM

## 2020-03-19 MED ORDER — CARBIDOPA-LEVODOPA ER 50-200 MG PO TBCR: 1.0000 | EXTENDED_RELEASE_TABLET | Freq: Every day | ORAL | 2 refills | Status: DC

## 2020-03-19 NOTE — Patient Instructions (Addendum)
1.  Take carbidopa/levodopa 25/100, 6am/10am/2pm/6pm and you can take another 1/2 tablet to full tablet if you awaken in the middle of the night (you can chew those ones and it can help you get back to sleep but they won't last as long that way) 2.  Continue carbidopa/levodopa 50/200 at bed 3.  I think that you should use a walker at all times 4.  You can take inbrija - this is 2 capsules (that is ONE dose) to be inhaled when you need it.  You can use it up to 5 times per day and separated by at least 2 hours each.  The physicians and staff at [facility] are committed to providing excellent care. You may receive a survey requesting feedback about your experience at our office. We strive to receive "very good" responses to the survey questions. If you feel that your experience would prevent you from giving the office a "very good " response, please contact our office to try to remedy the situation. We may be reached at [phone]. Thank you for taking the time out of your busy day to complete the survey.

## 2020-03-20 NOTE — Therapy (Signed)
[facility] [address] [place], [place], [address] Phone: [phone]   Fax:  [phone]  Physical Therapy Treatment  Patient Details  Name: Stanley Wright MRN: [id number] Date of Birth: 1945/06/23 Referring Provider (PT): Audrea Muscat Persons, PA-C   Encounter Date: 03/19/2020   PT End of Session - 03/20/20 1931    Visit Number 14    Number of Visits 17    Date for PT Re-Evaluation 53/64/68   90 day cert from 9 wk POC   Authorization Type [employer]    PT Start Time 1401    PT Stop Time 1444    PT Time Calculation (min) 43 min    Activity Tolerance Patient tolerated treatment well    Behavior During Therapy [facility] for tasks assessed/performed           Past Medical History:  Diagnosis Date  . Arthritis   . BENIGN PROSTATIC HYPERTROPHY, WITH OBSTRUCTION   . Cancer (HCC)    skin, basal, squamous  . COLONIC POLYPS   . Diverticulitis   . DVT (deep venous thrombosis) ([place]) 2000  . Dysrhythmia    Hx Afib- 2017  . Early cataract   . Factor 5 Leiden mutation, heterozygous ([place])   . GERD (gastroesophageal reflux disease)    not on medication  . Hearing aid worn    B/L  . HIATAL HERNIA   . ITP (idiopathic thrombocytopenic purpura) 2003  . Long term current use of anticoagulant   . Osteoarthritis    right foot  . Parkinson's disease ([place]) 02/03/2018  . PSORIASIS   . Pulmonary embolus ([place]) 2000  . Shortness of breath dyspnea    at times - Talking alot  . Sleep apnea   . Wears glasses     Past Surgical History:  Procedure Laterality Date  . ANKLE FUSION Right 08/24/2019   Procedure: RIGHT TALO-NAVICULAR FUSION;  Surgeon: Newt Minion, MD;  Location: [facility];  Service: Orthopedics;  Laterality: Right;  . CARDIOVERSION N/A 11/22/2015   Procedure: CARDIOVERSION;  Surgeon: Sanda Klein, MD;  [facility];  Service: Cardiovascular;  Laterality: N/A;  . COLONOSCOPY    . HERNIA REPAIR Left     Inguinal- x2 . mesh x1  . INSERTION OF MESH N/A 02/25/2016   Procedure: INSERTION OF MESH;  Surgeon: Rolm Bookbinder, MD;  Location: [facility];  Service: General;  Laterality: N/A;  . LUNG REMOVAL, PARTIAL Right 2004   was fungal and not cancerous  . PROSTATE SURGERY    . UMBILICAL HERNIA REPAIR N/A 02/25/2016   Procedure: LAPAROSCOPIC UMBILICAL HERNIA REPAIR;  Surgeon: Rolm Bookbinder, MD;  Location: [facility];  Service: General;  Laterality: N/A;    There were no vitals filed for this visit.   Subjective Assessment - 03/19/20 1408    Subjective Saw Dr. Carles Collet earlier this morning.  She wants to add Inbrija for the off-times.  My foot hurt "like the dickens" after Friday's visit.  Could hardly put weight on it Saturday, and iced it.  It's finally better.    Pertinent History R ankle fusion talonavicular/medial cuneiform arthrodesis, DVT/PE 07/2019, pneumonia, sepsis    Limitations Walking    Patient Stated Goals Pt's goals for therapy are to learn how to walk again.    Currently in Pain? Yes    Pain Score 3     Pain Location Foot    Pain Orientation Right    Pain Descriptors / Indicators Tender  Pain Type Chronic pain    Pain Frequency Intermittent    Aggravating Factors  walking, standing    Pain Relieving Factors just depends -sometimes worse than others                             [facility] Adult PT Treatment/Exercise - 03/19/20 1405      Ambulation/Gait   Ambulation/Gait Yes    Ambulation/Gait Assistance 5: Supervision    Ambulation Distance (Feet) 700 Feet   400 ft   Assistive device None    Gait Pattern Step-through pattern;Decreased arm swing - right;Decreased arm swing - left;Decreased step length - right;Decreased step length - left;Decreased stance time - right;Decreased dorsiflexion - right;Decreased dorsiflexion - left;Right foot flat;Festinating    Ambulation Surface Level;Indoor    Gait Comments After stretching and joint mobilizations, pt demo improved  heelstrike/toe off with gait on RLE.  He continues to carry walking pole and use to help with reciprocal arm swing.  With turns, pt continues to need cues to slow pace for improved foot clearance.  Pt able to ambulate > 5 minutes, with pt rating the same, 3/10      Knee/Hip Exercises: Stretches   Ecologist Limitations Runner's stretch position      Manual Therapy   Manual Therapy Joint mobilization    Manual therapy comments Passive stretch into ankle plantarflexion, ankle dorsiflexion, including toes, to help decrease dystonia response in toes.      Joint Mobilization Talocrural distraction grade II, grade II to III subtalar medial/lateral glides, distal tibiofibular AP& PA grade II, dorsiflexion with manual pressure given anterior portion of ankle joint, x 5 reps in standing      Ankle Exercises: Seated   Other Seated Ankle Exercises Performed ankle dorsiflexion RLE x 10 reps, ankle eversion x 10 reps ; 2 sets each, with cues for slowed pace and eccentric control.          Seated ankle active dorsiflexion 10 reps, eversion x 10 reps.           PT Short Term Goals - 02/21/20 0847      PT SHORT TERM GOAL #1   Title Pt will be independent with HEP for improved flexibility, strength, balance, transfers, and gait.  TARGET 02/24/2020    Time 5    Period Weeks    Status Achieved      PT SHORT TERM GOAL #2   Title Pt will demonstrate improved flexibility in R ankle dorsiflexion by at least 5 degrees and L ankle dorsiflexion by at least 3 degrees for improved flexibility for improved gait and decreased pain.    Baseline A/ROM R dorsiflexion -5 degrees, L 4 degrees; AROM R dorsiflexion 5 degrees, L 8 degrees    Time 5    Period Weeks    Status Achieved      PT SHORT TERM GOAL #3   Title Pt will improve TUG score to less than or equal to 13.5 seconds for improved gait efficiency and decreased fall risk.    Baseline 14.25 sec, festination with  initiation of gait    Time 5    Period Weeks    Status Partially Met      PT SHORT TERM GOAL #4   Title Pt will improve giat velocity to at least 2.3 ft/sec for improved gait efficiency and safety, with appropriate assistive device.    Baseline 2.5  ft/sec    Time 5    Period Weeks    Status Achieved             PT Long Term Goals - 03/20/20 1934      PT LONG TERM GOAL #1   Title Pt will be independent with progression of HEP for improved flexibility, strength, balance, transfers, and gait.  TARGET 03/23/2020    Time 9    Period Weeks    Status New      PT LONG TERM GOAL #2   Title Pt will rate no more than 2 point increase in pain from baseline, with gait > 5 minutes in duration for improved overall functional mobility.    Baseline no increase in pain x 5 minutes walking    Time 9    Period Weeks    Status Achieved      PT LONG TERM GOAL #3   Title Pt will improve TUG cognitive score to less than or equal to 15 seconds for decreased fall risk.    Time 9    Period Weeks    Status New      PT LONG TERM GOAL #4   Title Pt will improve gait velocity to at least 2.62 ft/sec for improved gait efficiency and safety.    Baseline 2.82 ft/sec 03/08/2020    Time 9    Period Weeks    Status Achieved      PT LONG TERM GOAL #5   Title Pt will demonstrate improved R ankle strength to at least 3+/5 for improved balance to prevent falls.    Baseline 4/5 R ankle eversion, dorsiflexion    Time 9    Period Weeks    Status Achieved                 Plan - 03/20/20 1932    Clinical Impression Statement Gentle manual distraction/joint mob techniques performed again this visit, with pt feeling overall looser after manual techniques, but also reports R ankle feeling weak.  Followed mnaual therapy with strengthening exercises and gait.  Pt's gait pattern today on straight path appears less antalgic and more fluid.  Pt has met LTG for 5 min of gait with minimal increase in pain.     Personal Factors and Comorbidities Comorbidity 3+    Comorbidities 08/24/2019  R ankle fusion talonavicular/medial cuneiform arthrodesis, DVT/PE 07/2019, pneumonia, sepsis    Examination-Activity Limitations Locomotion Level;Transfers;Stand;Carry    Examination-Participation Restrictions Church;Community Activity;Other   Community exercise   Stability/Clinical Decision Making Evolving/Moderate complexity    Rehab Potential Good    PT Frequency 2x / week    PT Duration Other (comment)   8 weeks plus eval visit = 9 weeks   PT Treatment/Interventions ADLs/Self Care Home Management;DME Instruction;Moist Heat;Gait training;Functional mobility training;Therapeutic activities;Therapeutic exercise;Balance training;Neuromuscular re-education;Manual techniques;Patient/family education;Passive range of motion    PT Next Visit Plan Finish checking LTGs and discuss POC (renew or discharge).  Consider continuing manual therapy for joint mobs to R ankle/foot and check to see how pt has done with additional stretch at home    Consulted and Agree with Plan of Care Patient           Patient will benefit from skilled therapeutic intervention in order to improve the following deficits and impairments:  Abnormal gait, Decreased range of motion, Difficulty walking, Impaired tone, Decreased activity tolerance, Pain, Decreased balance, Impaired flexibility, Decreased mobility, Decreased strength  Visit Diagnosis: Stiffness of right ankle, not elsewhere classified  Muscle weakness (generalized)  Other abnormalities of gait and mobility     Problem List Patient Active Problem List   Diagnosis Date Noted  . Primary osteoarthritis, right ankle and foot   . Lower extremity edema 08/10/2019  . Acute DVT (deep venous thrombosis) ([place]) 07/19/2019  . History of pulmonary embolism 07/19/2019  . Parkinson's disease ([place]) 02/03/2018  . Community acquired pneumonia of left lower lobe of lung 12/23/2017  . Sepsis ([place])  12/23/2017  . Morbid obesity due to excess calories ([place]) 08/23/2016  . OSA on CPAP 08/23/2016  . Dyspnea on exertion 08/22/2016  . Umbilical hernia 06/98/6148  . Persistent atrial fibrillation ([place])   . Encounter for therapeutic drug monitoring 08/12/2013  . Long term current use of anticoagulant 07/30/2010  . COLONIC POLYPS 06/03/2010  . Factor V Leiden ([place]) 06/03/2010  . GERD 06/03/2010  . HIATAL HERNIA 06/03/2010  . BENIGN PROSTATIC HYPERTROPHY, WITH OBSTRUCTION 06/03/2010  . PSORIASIS 06/03/2010    Gaege Sangalang W. 03/20/2020, 7:36 PM  Frazier Butt., PT   [facility] [address] [place], [place], [address] Phone: [phone]   Fax:  [phone]  Name: WALTER GRIMA MRN: [id number] Date of Birth: 1946-02-26

## 2020-03-23 ENCOUNTER — Other Ambulatory Visit: Payer: Self-pay

## 2020-03-23 ENCOUNTER — Ambulatory Visit: Payer: PPO | Admitting: Physical Therapy

## 2020-03-23 DIAGNOSIS — M6281 Muscle weakness (generalized): Secondary | ICD-10-CM

## 2020-03-23 DIAGNOSIS — R2689 Other abnormalities of gait and mobility: Secondary | ICD-10-CM

## 2020-03-23 DIAGNOSIS — R2681 Unsteadiness on feet: Secondary | ICD-10-CM

## 2020-03-23 NOTE — Therapy (Signed)
[facility] [address] [place], [place], [address] Phone: [phone]   Fax:  [phone]  Physical Therapy Treatment  Patient Details  Name: Stanley Wright MRN: [id number] Date of Birth: 10/14/1945 Referring Provider (PT): Audrea Muscat Persons, PA-C   Encounter Date: 03/23/2020   PT End of Session - 03/23/20 1446    Visit Number 15    Number of Visits 17    Date for PT Re-Evaluation 96/22/29   90 day cert from 9 wk POC   Authorization Type [employer]    PT Start Time 0804    PT Stop Time 0845    PT Time Calculation (min) 41 min    Activity Tolerance Patient tolerated treatment well    Behavior During Therapy [facility] for tasks assessed/performed           Past Medical History:  Diagnosis Date  . Arthritis   . BENIGN PROSTATIC HYPERTROPHY, WITH OBSTRUCTION   . Cancer (HCC)    skin, basal, squamous  . COLONIC POLYPS   . Diverticulitis   . DVT (deep venous thrombosis) ([place]) 2000  . Dysrhythmia    Hx Afib- 2017  . Early cataract   . Factor 5 Leiden mutation, heterozygous ([place])   . GERD (gastroesophageal reflux disease)    not on medication  . Hearing aid worn    B/L  . HIATAL HERNIA   . ITP (idiopathic thrombocytopenic purpura) 2003  . Long term current use of anticoagulant   . Osteoarthritis    right foot  . Parkinson's disease ([place]) 02/03/2018  . PSORIASIS   . Pulmonary embolus ([place]) 2000  . Shortness of breath dyspnea    at times - Talking alot  . Sleep apnea   . Wears glasses     Past Surgical History:  Procedure Laterality Date  . ANKLE FUSION Right 08/24/2019   Procedure: RIGHT TALO-NAVICULAR FUSION;  Surgeon: Newt Minion, MD;  Location: [facility];  Service: Orthopedics;  Laterality: Right;  . CARDIOVERSION N/A 11/22/2015   Procedure: CARDIOVERSION;  Surgeon: Sanda Klein, MD;  [facility];  Service: Cardiovascular;  Laterality: N/A;  . COLONOSCOPY    . HERNIA REPAIR Left     Inguinal- x2 . mesh x1  . INSERTION OF MESH N/A 02/25/2016   Procedure: INSERTION OF MESH;  Surgeon: Rolm Bookbinder, MD;  Location: [facility];  Service: General;  Laterality: N/A;  . LUNG REMOVAL, PARTIAL Right 2004   was fungal and not cancerous  . PROSTATE SURGERY    . UMBILICAL HERNIA REPAIR N/A 02/25/2016   Procedure: LAPAROSCOPIC UMBILICAL HERNIA REPAIR;  Surgeon: Rolm Bookbinder, MD;  Location: [facility];  Service: General;  Laterality: N/A;    There were no vitals filed for this visit.   Subjective Assessment - 03/23/20 0807    Subjective Had no pain walking in here today, but earlier, had a lot of pain in Achilles tendon.  Not sure that the mobilizations from last Friday haven't made it worse (upon starting therapy session today).  At end of session and with discussions throughout, pt reports feeling his walking is overall better and pain is better with walking than it was before.    Pertinent History R ankle fusion talonavicular/medial cuneiform arthrodesis, DVT/PE 07/2019, pneumonia, sepsis    Limitations Walking    Patient Stated Goals Pt's goals for therapy are to learn how to walk again.    Currently in Pain? No/denies   not when walking in  [facility] Adult PT Treatment/Exercise - 03/23/20 0001      Ambulation/Gait   Ambulation/Gait Yes    Ambulation/Gait Assistance 5: Supervision    Ambulation Distance (Feet) 780 Feet    Assistive device Other (Comment)   carries walking pole   Gait Pattern Step-through pattern;Decreased arm swing - right;Decreased arm swing - left;Decreased step length - right;Decreased step length - left;Decreased stance time - right;Decreased dorsiflexion - right;Decreased dorsiflexion - left;Right foot flat;Festinating    Ambulation Surface Level;Indoor    Gait Comments Pt ambulates 5 minutes at beginning of session, carrying walking pole, rates pain as 1-2/10, mostly due to fatigue.      Timed Up and Go Test   TUG  Normal TUG;Cognitive TUG    Normal TUG (seconds) 18.28   (turning to R), 11.53 (turn to L) , 17.22 (turn to R), 14.88     Self-Care   Self-Care Other Self-Care Comments    Other Self-Care Comments  Discussed progress towards goals, pt's overall improvement in gait velocity, decreased pain with gait.  Discussed that pt is independent with current HEP, and that pt's pain continues to be present (varied at times and with activity, despite ROM, flexibility, gentle manual therapy, and stretching activities).  Pt agreeable to taking a few weeks off to work on exercises on his own and would like to return for 1 more visit to check-in in about 1 month.  Discussed pt's pain in R superior aspect of foot:  if it worsens in duration, frequency, intensity, to the point it is limiting/interfereing with daily activities, advised pt to contact MD.      Neuro Re-ed    Neuro Re-ed Details  NMR for activation of ankle musculature, standing on incline and decline of ramp in clinic:  feet apart with head turns/head nods x 5 reps; EC head steady x 10 sec.  Performed same head motions with partial tandem stance.  No device for support today with min guard assistance.      Knee/Hip Exercises: Standing   Knee Flexion Strengthening;Right;Left;10 reps;1 set    Hip Flexion Stengthening;Right;Left;10 reps;Knee straight;Knee bent;1 set    Hip Abduction Stengthening;Right;Left;10 reps;1 set    Hip Extension Stengthening;Right;Left;10 reps;1 set    Other Standing Knee Exercises Reviewed standing hip exercises (pt has been performing at home), cues provided to maintain slow pace to for concentric/eccentric phases of the exercises.                  PT Education - 03/23/20 1445    Education Details Progress to goals, POC; plans to return to PT for 1 more visit in a month to check in (then may plan to d/c)    Person(s) Educated Patient    Methods Explanation            PT Short Term Goals - 02/21/20 0847      PT  SHORT TERM GOAL #1   Title Pt will be independent with HEP for improved flexibility, strength, balance, transfers, and gait.  TARGET 02/24/2020    Time 5    Period Weeks    Status Achieved      PT SHORT TERM GOAL #2   Title Pt will demonstrate improved flexibility in R ankle dorsiflexion by at least 5 degrees and L ankle dorsiflexion by at least 3 degrees for improved flexibility for improved gait and decreased pain.    Baseline A/ROM R dorsiflexion -5 degrees, L 4 degrees; AROM R dorsiflexion 5 degrees, L 8 degrees  Time 5    Period Weeks    Status Achieved      PT SHORT TERM GOAL #3   Title Pt will improve TUG score to less than or equal to 13.5 seconds for improved gait efficiency and decreased fall risk.    Baseline 14.25 sec, festination with initiation of gait    Time 5    Period Weeks    Status Partially Met      PT SHORT TERM GOAL #4   Title Pt will improve giat velocity to at least 2.3 ft/sec for improved gait efficiency and safety, with appropriate assistive device.    Baseline 2.5 ft/sec    Time 5    Period Weeks    Status Achieved             PT Long Term Goals - 03/23/20 1448      PT LONG TERM GOAL #1   Title Pt will be independent with progression of HEP for improved flexibility, strength, balance, transfers, and gait.  TARGET 03/23/2020    Time 9    Period Weeks    Status Achieved      PT LONG TERM GOAL #2   Title Pt will rate no more than 2 point increase in pain from baseline, with gait > 5 minutes in duration for improved overall functional mobility.    Baseline no increase in pain x 5 minutes walking    Time 9    Period Weeks    Status Achieved      PT LONG TERM GOAL #3   Title Pt will improve TUG cognitive score to less than or equal to 15 seconds for decreased fall risk.    Baseline 11-17 seconds in 4 trials    Time 9    Period Weeks    Status Partially Met      PT LONG TERM GOAL #4   Title Pt will improve gait velocity to at least 2.62  ft/sec for improved gait efficiency and safety.    Baseline 2.82 ft/sec 03/08/2020    Time 9    Period Weeks    Status Achieved      PT LONG TERM GOAL #5   Title Pt will demonstrate improved R ankle strength to at least 3+/5 for improved balance to prevent falls.    Baseline 4/5 R ankle eversion, dorsiflexion    Time 9    Period Weeks    Status Achieved                 Plan - 03/23/20 1449    Clinical Impression Statement Assessed remaining LTGs, with pt meeting LTG 1 for HEP and partially meeting LTG 3 for TUG score.  Reiterated strategies for better movements with turns (starting with RLE and taking longer strides/wider BOS with turns).  Overall, pt reports feeling his walking is better, and in both sessions this week, he has <2 point increase in pain with walking 5 minutes.  Pt would like to work on exercises and gait on his own and return for a check in approx 1 month from now, with potential d/c at that time.    Personal Factors and Comorbidities Comorbidity 3+    Comorbidities 08/24/2019  R ankle fusion talonavicular/medial cuneiform arthrodesis, DVT/PE 07/2019, pneumonia, sepsis    Examination-Activity Limitations Locomotion Level;Transfers;Stand;Carry    Examination-Participation Restrictions Church;Community Activity;Other   Community exercise   Stability/Clinical Decision Making Evolving/Moderate complexity    Rehab Potential Good    PT Frequency 2x /  week    PT Duration Other (comment)   8 weeks plus eval visit = 9 weeks   PT Treatment/Interventions ADLs/Self Care Home Management;DME Instruction;Moist Heat;Gait training;Functional mobility training;Therapeutic activities;Therapeutic exercise;Balance training;Neuromuscular re-education;Manual techniques;Patient/family education;Passive range of motion    PT Next Visit Plan When pt return, will need renewal/recert/discharge.  Check on HEP and pain with gait    Consulted and Agree with Plan of Care Patient            Patient will benefit from skilled therapeutic intervention in order to improve the following deficits and impairments:  Abnormal gait, Decreased range of motion, Difficulty walking, Impaired tone, Decreased activity tolerance, Pain, Decreased balance, Impaired flexibility, Decreased mobility, Decreased strength  Visit Diagnosis: Other abnormalities of gait and mobility  Muscle weakness (generalized)  Unsteadiness on feet     Problem List Patient Active Problem List   Diagnosis Date Noted  . Primary osteoarthritis, right ankle and foot   . Lower extremity edema 08/10/2019  . Acute DVT (deep venous thrombosis) ([place]) 07/19/2019  . History of pulmonary embolism 07/19/2019  . Parkinson's disease ([place]) 02/03/2018  . Community acquired pneumonia of left lower lobe of lung 12/23/2017  . Sepsis ([place]) 12/23/2017  . Morbid obesity due to excess calories ([place]) 08/23/2016  . OSA on CPAP 08/23/2016  . Dyspnea on exertion 08/22/2016  . Umbilical hernia 44/83/0159  . Persistent atrial fibrillation ([place])   . Encounter for therapeutic drug monitoring 08/12/2013  . Long term current use of anticoagulant 07/30/2010  . COLONIC POLYPS 06/03/2010  . Factor V Leiden ([place]) 06/03/2010  . GERD 06/03/2010  . HIATAL HERNIA 06/03/2010  . BENIGN PROSTATIC HYPERTROPHY, WITH OBSTRUCTION 06/03/2010  . PSORIASIS 06/03/2010    Beckem Tomberlin W. 03/23/2020, 2:53 PM Mady Haagensen, PT 03/23/20 2:59 PM Phone: [phone] Fax: [facility] [address] [place], [place], [address] Phone: [phone]   Fax:  [phone]  Name: ABIGAIL MARSIGLIA MRN: [id number] Date of Birth: 02-03-1946

## 2020-03-26 ENCOUNTER — Ambulatory Visit: Payer: PPO | Admitting: Physical Therapy

## 2020-03-30 ENCOUNTER — Ambulatory Visit (INDEPENDENT_AMBULATORY_CARE_PROVIDER_SITE_OTHER): Payer: PPO | Admitting: *Deleted

## 2020-03-30 ENCOUNTER — Ambulatory Visit: Payer: PPO | Admitting: Physical Therapy

## 2020-03-30 ENCOUNTER — Other Ambulatory Visit: Payer: Self-pay

## 2020-03-30 DIAGNOSIS — Z5181 Encounter for therapeutic drug level monitoring: Secondary | ICD-10-CM | POA: Diagnosis not present

## 2020-03-30 DIAGNOSIS — I4819 Other persistent atrial fibrillation: Secondary | ICD-10-CM

## 2020-03-30 LAB — POCT INR: INR: 2.4 (ref 2.0–3.0)

## 2020-03-30 NOTE — Patient Instructions (Signed)
Description   Continue same dosage 1 tablet daily except for 1.5 tablets on Mondays, Wednesdays and Fridays. Recheck INR in 3 weeks. [facility] [phone]

## 2020-04-02 DIAGNOSIS — S0181XS Laceration without foreign body of other part of head, sequela: Secondary | ICD-10-CM | POA: Diagnosis not present

## 2020-04-02 DIAGNOSIS — M542 Cervicalgia: Secondary | ICD-10-CM | POA: Diagnosis not present

## 2020-04-02 DIAGNOSIS — G245 Blepharospasm: Secondary | ICD-10-CM | POA: Diagnosis not present

## 2020-04-02 DIAGNOSIS — H02413 Mechanical ptosis of bilateral eyelids: Secondary | ICD-10-CM | POA: Diagnosis not present

## 2020-04-02 DIAGNOSIS — G44209 Tension-type headache, unspecified, not intractable: Secondary | ICD-10-CM | POA: Diagnosis not present

## 2020-04-02 DIAGNOSIS — M47812 Spondylosis without myelopathy or radiculopathy, cervical region: Secondary | ICD-10-CM | POA: Diagnosis not present

## 2020-04-02 DIAGNOSIS — M9901 Segmental and somatic dysfunction of cervical region: Secondary | ICD-10-CM | POA: Diagnosis not present

## 2020-04-03 NOTE — Progress Notes (Signed)
HPI: FUpermanentatrial fibrillation and history of factor V Leiden with 2 previous pulmonary emboli. He is on chronic Coumadin. Abdominal CT 2011 showed no aneurysm. Found to be in atrial fibrillation onpreviouselectrocardiogram. TSH 2.88. Patient placed on Cardizem for rate control. Had successful DCCV 11/22/15.However atrial fibrillation recurred. Holter monitor September 2017 showed atrial fibrillation with PVCs or aberrantly conducted beats. Cardizem increased to 300 mg daily.Nuclear study 2/18 showed no no ischemia or infarction.Previously treated with hydrochlorothiazide for volume excess. Since last seen,he denies dyspnea, chest pain, palpitations or syncope.  Pedal edema is slightly worse.  Current Outpatient Medications  Medication Sig Dispense Refill   carbidopa-levodopa (SINEMET CR) 50-200 MG tablet Take 1 tablet by mouth at bedtime. 90 tablet 2   carbidopa-levodopa (SINEMET IR) 25-100 MG tablet TAKE 1 TABLET BY MOUTH FOUR TO  FIVE TIMES DAILY 450 tablet 0   CARTIA XT 300 MG 24 hr capsule Take 1 capsule by mouth once daily 90 capsule 3   furosemide (LASIX) 40 MG tablet Take 2 tablets (80 mg) on Monday, Wednesday, Friday; 1 tablet (40 mg) all other days. (Patient taking differently: Take 40-80 mg by mouth See admin instructions. Take 80 mg on Monday, Wednesday, Friday and 40 mg all other days.) 360 tablet 1   warfarin (COUMADIN) 5 MG tablet TAKE 1 TO 1.5 TABLETS BY MOUTH ONCE DAILY OR  AS  DIRECTED  BY  COUMADIN  CLINIC 120 tablet 0   No current facility-administered medications for this visit.     Past Medical History:  Diagnosis Date   Arthritis    BENIGN PROSTATIC HYPERTROPHY, WITH OBSTRUCTION    Cancer ([place])    skin, basal, squamous   COLONIC POLYPS    Diverticulitis    DVT (deep venous thrombosis) ([place]) 2000   Dysrhythmia    Hx Afib- 2017   Early cataract    Factor 5 Leiden mutation, heterozygous ([place])    GERD (gastroesophageal reflux disease)     not on medication   Hearing aid worn    B/L   HIATAL HERNIA    ITP (idiopathic thrombocytopenic purpura) 2003   Long term current use of anticoagulant    Osteoarthritis    right foot   Parkinson's disease ([place]) 02/03/2018   PSORIASIS    Pulmonary embolus ([place]) 2000   Shortness of breath dyspnea    at times - Talking alot   Sleep apnea    Wears glasses     Past Surgical History:  Procedure Laterality Date   ANKLE FUSION Right 08/24/2019   Procedure: RIGHT TALO-NAVICULAR FUSION;  Surgeon: Newt Minion, MD;  Location: [place] [facility] [place];  Service: Orthopedics;  Laterality: Right;   CARDIOVERSION N/A 11/22/2015   Procedure: CARDIOVERSION;  Surgeon: Sanda Klein, MD;  Location: [facility];  Service: Cardiovascular;  Laterality: N/A;   COLONOSCOPY     HERNIA REPAIR Left    Inguinal- x2 . mesh x1   INSERTION OF MESH N/A 02/25/2016   Procedure: INSERTION OF MESH;  Surgeon: Rolm Bookbinder, MD;  Location: [facility];  Service: General;  Laterality: N/A;   LUNG REMOVAL, PARTIAL Right 2004   was fungal and not cancerous   PROSTATE SURGERY     UMBILICAL HERNIA REPAIR N/A 02/25/2016   Procedure: LAPAROSCOPIC UMBILICAL HERNIA REPAIR;  Surgeon: Rolm Bookbinder, MD;  [facility];  Service: General;  Laterality: N/A;    Social History   Socioeconomic History   Marital status: Divorced    Spouse name: Not on file  Number of children: 2   Years of education: Masters   Highest education level: Not on file  Occupational History    Employer: RETIRED    Comment: Engineer, structural  Tobacco Use   Smoking status: Former Smoker    Packs/day: 1.00    Quit date: 06/16/1985    Years since quitting: 34.8   Smokeless tobacco: Never Used   Tobacco comment: quit approx age 68-   Vaping Use   Vaping Use: Never used  Substance and Sexual Activity   Alcohol use: No   Drug use: No   Sexual activity: Yes  Other Topics Concern   Not on file  Social History Narrative   Lives  alone   Caffeine use: Coffee daily   Right handed    Social Determinants of Health   Financial Resource Strain:    Difficulty of Paying Living Expenses: Not on file  Food Insecurity:    Worried About [place] in the Last Year: Not on file   [employer] of Food in the Last Year: Not on file  Transportation Needs:    Lack of Transportation (Medical): Not on file   Lack of Transportation (Non-Medical): Not on file  Physical Activity:    Days of Exercise per Week: Not on file   Minutes of Exercise per Session: Not on file  Stress:    Feeling of Stress : Not on file  Social Connections:    Frequency of Communication with Friends and Family: Not on file   Frequency of Social Gatherings with Friends and Family: Not on file   Attends Religious Services: Not on file   Active Member of Clubs or Organizations: Not on file   Attends Archivist Meetings: Not on file   Marital Status: Not on file  Intimate Partner Violence:    Fear of Current or Ex-Partner: Not on file   Emotionally Abused: Not on file   Physically Abused: Not on file   Sexually Abused: Not on file    Family History  Problem Relation Age of Onset   Cancer Father    Breast cancer Sister    Heart disease Brother    Cancer Maternal Grandmother    Stroke Paternal Grandfather     ROS: no fevers or chills, productive cough, hemoptysis, dysphasia, odynophagia, melena, hematochezia, dysuria, hematuria, rash, seizure activity, orthopnea, PND, claudication. Remaining systems are negative.  Physical Exam: Well-developed well-nourished in no acute distress.  Skin is warm and dry.  HEENT is normal.  Neck is supple.  Chest is clear to auscultation with normal expansion.  Cardiovascular exam is irregular and tachycardic Abdominal exam nontender or distended. No masses palpated. Extremities show 1+ edema. neuro grossly intact  ECG-atrial fibrillation at a rate of 115, cannot rule out  prior inferior infarct.  Personally reviewed  A/P  1 permanent atrial fibrillation-continue Cardizem at present dose.  Heart rate mildly elevated today.  I will arrange a monitor to make sure that his rate is adequately controlled and make adjustments based on results.  Continue Coumadin with goal INR 2-3.  Check hemoglobin.  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  3 chronic pedal edema-slightly worse.  Will increase Lasix to 40 mg daily.  Check potassium and renal function in 1 week.  4 history of Parkinson's  Kirk Ruths, MD

## 2020-04-05 DIAGNOSIS — I5032 Chronic diastolic (congestive) heart failure: Secondary | ICD-10-CM | POA: Diagnosis not present

## 2020-04-05 DIAGNOSIS — G20 Parkinson's disease: Secondary | ICD-10-CM | POA: Diagnosis not present

## 2020-04-05 DIAGNOSIS — I482 Chronic atrial fibrillation, unspecified: Secondary | ICD-10-CM | POA: Diagnosis not present

## 2020-04-05 DIAGNOSIS — H35033 Hypertensive retinopathy, bilateral: Secondary | ICD-10-CM | POA: Diagnosis not present

## 2020-04-09 ENCOUNTER — Ambulatory Visit: Payer: PPO | Admitting: Cardiology

## 2020-04-09 ENCOUNTER — Encounter: Payer: Self-pay | Admitting: *Deleted

## 2020-04-09 ENCOUNTER — Encounter: Payer: Self-pay | Admitting: Cardiology

## 2020-04-09 ENCOUNTER — Other Ambulatory Visit: Payer: Self-pay | Admitting: Cardiology

## 2020-04-09 ENCOUNTER — Other Ambulatory Visit: Payer: Self-pay

## 2020-04-09 VITALS — BP 108/70 | HR 115 | Ht 72.0 in | Wt 271.8 lb

## 2020-04-09 DIAGNOSIS — D6851 Activated protein C resistance: Secondary | ICD-10-CM

## 2020-04-09 DIAGNOSIS — M542 Cervicalgia: Secondary | ICD-10-CM | POA: Diagnosis not present

## 2020-04-09 DIAGNOSIS — R600 Localized edema: Secondary | ICD-10-CM | POA: Diagnosis not present

## 2020-04-09 DIAGNOSIS — I5032 Chronic diastolic (congestive) heart failure: Secondary | ICD-10-CM

## 2020-04-09 DIAGNOSIS — M47812 Spondylosis without myelopathy or radiculopathy, cervical region: Secondary | ICD-10-CM | POA: Diagnosis not present

## 2020-04-09 DIAGNOSIS — M9901 Segmental and somatic dysfunction of cervical region: Secondary | ICD-10-CM | POA: Diagnosis not present

## 2020-04-09 DIAGNOSIS — I4821 Permanent atrial fibrillation: Secondary | ICD-10-CM

## 2020-04-09 DIAGNOSIS — G44209 Tension-type headache, unspecified, not intractable: Secondary | ICD-10-CM | POA: Diagnosis not present

## 2020-04-09 MED ORDER — FUROSEMIDE 40 MG PO TABS: 40.0000 mg | ORAL_TABLET | Freq: Every day | ORAL | 3 refills | Status: DC

## 2020-04-09 NOTE — Patient Instructions (Signed)
Medication Instructions:   TAKE 40 MG OF FUROSEMIDE DAILY  *If you need a refill on your cardiac medications before your next appointment, please call your pharmacy*   Lab Work:  Your physician recommends that you return for lab work in:ONE WEEK  If you have labs (blood work) drawn today and your tests are completely normal, you will receive your results only by: Marland Kitchen MyChart Message (if you have MyChart) OR . A paper copy in the mail If you have any lab test that is abnormal or we need to change your treatment, we will call you to review the results.   Testing/Procedures: [place] [place]- Long Term Monitor Instructions   Your physician has requested you wear your ZIO patch monitor__3_____days.   This is a single patch monitor.  [employer] supplies one patch monitor per enrollment.  Additional stickers are not available.   Please do not apply patch if you will be having a Nuclear Stress Test, Echocardiogram, Cardiac CT, MRI, or Chest Xray during the time frame you would be wearing the monitor. The patch cannot be worn during these tests.  You cannot remove and re-apply the ZIO XT patch monitor.   Your ZIO patch monitor will be sent USPS Priority mail from [facility] directly to your home address. The monitor may also be mailed to a PO BOX if home delivery is not available.   It may take 3-5 days to receive your monitor after you have been enrolled.   Once you have received you monitor, please review enclosed instructions.  Your monitor has already been registered assigning a specific monitor serial # to you.   Applying the monitor   Shave hair from upper left chest.   Hold abrader disc by orange tab.  Rub abrader in 40 strokes over left upper chest as indicated in your monitor instructions.   Clean area with 4 enclosed alcohol pads .  Use all pads to assure are is cleaned thoroughly.  Let dry.   Apply patch as indicated in monitor instructions.  Patch will be place under collarbone  on left side of chest with arrow pointing upward.   Rub patch adhesive wings for 2 minutes.Remove white label marked "1".  Remove white label marked "2".  Rub patch adhesive wings for 2 additional minutes.   While looking in a mirror, press and release button in center of patch.  A small green light will flash 3-4 times .  This will be your only indicator the monitor has been turned on.     Do not shower for the first 24 hours.  You may shower after the first 24 hours.   Press button if you feel a symptom. You will hear a small click.  Record Date, Time and Symptom in the Patient Log Book.   When you are ready to remove patch, follow instructions on last 2 pages of Patient Log Book.  Stick patch monitor onto last page of Patient Log Book.   Place Patient Log Book in [place] box.  Use locking tab on box and tape box closed securely.  The [employer] has [employer] on it.  Please place in mailbox as soon as possible.  Your physician should have your test results approximately 7 days after the monitor has been mailed back to [facility].   Call [place] at [phone] if you have questions regarding your ZIO XT patch monitor.  Call them immediately if you see an orange light blinking on your  monitor.   If your monitor falls off in less than 4 days contact our Monitor department at [phone].  If your monitor becomes loose or falls off after 4 days call [employer] at [phone] for suggestions on securing your monitor.      Follow-Up: At [facility], you and your health needs are our priority.  As part of our continuing mission to provide you with exceptional heart care, we have created designated Provider Care Teams.  These Care Teams include your primary Cardiologist (physician) and Advanced Practice Providers (APPs -  Physician Assistants and Nurse Practitioners) who all work together to provide you with the care you need, when you need it.  We  recommend signing up for the patient portal called "MyChart".  Sign up information is provided on this After Visit Summary.  MyChart is used to connect with patients for Virtual Visits (Telemedicine).  Patients are able to view lab/test results, encounter notes, upcoming appointments, etc.  Non-urgent messages can be sent to your provider as well.   To learn more about what you can do with MyChart, go to [email or web address].    Your next appointment:   6 month(s)  The format for your next appointment:   In Person  Provider:   Kirk Ruths, MD

## 2020-04-09 NOTE — Progress Notes (Signed)
Patient ID: Stanley Wright, male   DOB: 08/15/1945, 74 y.o.   MRN: [id number] Patient enrolled for [employer] to ship a 3 day ZIO XT long term holter monitor to his home.

## 2020-04-13 ENCOUNTER — Other Ambulatory Visit (INDEPENDENT_AMBULATORY_CARE_PROVIDER_SITE_OTHER): Payer: PPO

## 2020-04-13 DIAGNOSIS — I4821 Permanent atrial fibrillation: Secondary | ICD-10-CM | POA: Diagnosis not present

## 2020-04-16 DIAGNOSIS — M47812 Spondylosis without myelopathy or radiculopathy, cervical region: Secondary | ICD-10-CM | POA: Diagnosis not present

## 2020-04-16 DIAGNOSIS — G44209 Tension-type headache, unspecified, not intractable: Secondary | ICD-10-CM | POA: Diagnosis not present

## 2020-04-16 DIAGNOSIS — M542 Cervicalgia: Secondary | ICD-10-CM | POA: Diagnosis not present

## 2020-04-16 DIAGNOSIS — M9901 Segmental and somatic dysfunction of cervical region: Secondary | ICD-10-CM | POA: Diagnosis not present

## 2020-04-19 ENCOUNTER — Other Ambulatory Visit: Payer: Self-pay

## 2020-04-19 ENCOUNTER — Ambulatory Visit: Payer: PPO | Attending: Neurology | Admitting: Physical Therapy

## 2020-04-19 DIAGNOSIS — R2689 Other abnormalities of gait and mobility: Secondary | ICD-10-CM | POA: Insufficient documentation

## 2020-04-19 DIAGNOSIS — R29818 Other symptoms and signs involving the nervous system: Secondary | ICD-10-CM | POA: Diagnosis not present

## 2020-04-19 DIAGNOSIS — M47812 Spondylosis without myelopathy or radiculopathy, cervical region: Secondary | ICD-10-CM | POA: Diagnosis not present

## 2020-04-19 DIAGNOSIS — M9901 Segmental and somatic dysfunction of cervical region: Secondary | ICD-10-CM | POA: Diagnosis not present

## 2020-04-19 DIAGNOSIS — G44209 Tension-type headache, unspecified, not intractable: Secondary | ICD-10-CM | POA: Diagnosis not present

## 2020-04-19 DIAGNOSIS — M542 Cervicalgia: Secondary | ICD-10-CM | POA: Diagnosis not present

## 2020-04-19 NOTE — Addendum Note (Signed)
Addended by: Frazier Butt on: 04/19/2020 09:42 AM   Modules accepted: Orders

## 2020-04-19 NOTE — Therapy (Signed)
[facility] [address] [place], [place], [address] Phone: [phone]   Fax:  [phone]  Physical Therapy Treatment/Discharge Summary  Patient Details  Name: Stanley Wright MRN: [id number] Date of Birth: January 05, 1946 Referring Provider (PT): Audrea Muscat Persons, PA-C   Encounter Date: 04/19/2020   PT End of Session - 04/19/20 0929    Visit Number 16    Number of Visits 17    Date for PT Re-Evaluation --   recert complete for 1 visit 04/19/2020   Authorization Type [employer]    PT Start Time 1245    PT Stop Time 0920   d/c visit today   PT Time Calculation (min) 34 min    Activity Tolerance Patient tolerated treatment well    Behavior During Therapy [facility] for tasks assessed/performed           Past Medical History:  Diagnosis Date  . Arthritis   . BENIGN PROSTATIC HYPERTROPHY, WITH OBSTRUCTION   . Cancer ([facility])    skin, basal, squamous  . COLONIC POLYPS   . Diverticulitis   . DVT (deep venous thrombosis) ([place]) 2000  . Dysrhythmia    Hx Afib- 2017  . Early cataract   . Factor 5 Leiden mutation, heterozygous ([place])   . GERD (gastroesophageal reflux disease)    not on medication  . Hearing aid worn    B/L  . HIATAL HERNIA   . ITP (idiopathic thrombocytopenic purpura) 2003  . Long term current use of anticoagulant   . Osteoarthritis    right foot  . Parkinson's disease ([place]) 02/03/2018  . PSORIASIS   . Pulmonary embolus ([place]) 2000  . Shortness of breath dyspnea    at times - Talking alot  . Sleep apnea   . Wears glasses     Past Surgical History:  Procedure Laterality Date  . ANKLE FUSION Right 08/24/2019   Procedure: RIGHT TALO-NAVICULAR FUSION;  Surgeon: Newt Minion, MD;  Location: [facility];  Service: Orthopedics;  Laterality: Right;  . CARDIOVERSION N/A 11/22/2015   Procedure: CARDIOVERSION;  Surgeon: Sanda Klein, MD;  [facility];  Service: Cardiovascular;  Laterality: N/A;  .  COLONOSCOPY    . HERNIA REPAIR Left    Inguinal- x2 . mesh x1  . INSERTION OF MESH N/A 02/25/2016   Procedure: INSERTION OF MESH;  Surgeon: Rolm Bookbinder, MD;  Location: [facility];  Service: General;  Laterality: N/A;  . LUNG REMOVAL, PARTIAL Right 2004   was fungal and not cancerous  . PROSTATE SURGERY    . UMBILICAL HERNIA REPAIR N/A 02/25/2016   Procedure: LAPAROSCOPIC UMBILICAL HERNIA REPAIR;  Surgeon: Rolm Bookbinder, MD;  Location: [facility] [place];  Service: General;  Laterality: N/A;    There were no vitals filed for this visit.   Subjective Assessment - 04/19/20 0850    Subjective Have had a lot happen since I was here last-had a frozen pack of chicken fall on my foot.  Did not get it checked out by the doctor, but it feels okay today.  Had some back pain and sciatica from bad positioning in the recliner.  Went to chiropractor this morning, and he worked on that.    Pertinent History R ankle fusion talonavicular/medial cuneiform arthrodesis, DVT/PE 07/2019, pneumonia, sepsis    Limitations Walking    Patient Stated Goals Pt's goals for therapy are to learn how to walk again.    Currently in Pain? No/denies  [place] Adult PT Treatment/Exercise - 04/19/20 0001      Ambulation/Gait   Ambulation/Gait Yes    Ambulation/Gait Assistance 5: Supervision    Ambulation Distance (Feet) 600 Feet   150   Assistive device Other (Comment)   U-Step Rolling walker   Gait Pattern Step-through pattern;Decreased arm swing - right;Decreased arm swing - left;Decreased step length - right;Decreased step length - left;Decreased stance time - right;Decreased dorsiflexion - right;Decreased dorsiflexion - left;Right foot flat;Festinating   festination with fatigue, with turns   Ambulation Surface Level;Indoor    Gait velocity 11.25 sec = 2.92 ft/sec    Gait Comments Pt ambulates 5 minutes in session, using U-step RW, no increase in pain; pain still 0/10      Self-Care     Self-Care Other Self-Care Comments    Other Self-Care Comments  Discussed pt's status-no pain with gait, importance of conitnuing HEP.  Discussed strategies to help ease discomfort in R ankle-make sure to to ankle exercises and standing weightshifting/weigthbearing upon standing to "warm up" through R foot.  Disucssed d/c today, and pt in agreement; plans to return eval in 6 months.           Review of HEP, with pt return demo understanding, just needs cues to slow pace of exercises at times. Access Code: [email or web address] URL: [email or web address] Date: 03/16/2020 Prepared by: Mady Haagensen  Exercises Seated Ankle Dorsiflexion AROM - 1-2 x daily - 5 x weekly - 1-2 sets - 10 reps - 3 sec hold Seated Heel Raise - 1-2 x daily - 5 x weekly - 1-2 sets - 10 reps - 3 sec hold Seated Ankle Eversion AROM - 1-2 x daily - 5 x weekly - 1-2 sets - 10 reps - 3 sec hold Seated Ankle Dorsiflexion with Resistance - 1 x daily - 5 x weekly - 2-3 sets - 10 reps - 3 sec hold Seated Ankle Eversion with Resistance - 1 x daily - 5 x weekly - 2-3 sets - 10 reps - 3 sec hold Seated Ankle Plantar Flexion with Resistance Loop - 1 x daily - 5 x weekly - 2-3 sets - 10 reps Staggered Stance Forward Backward Weight Shift with Counter Support - 1 x daily - 7 x weekly - 2 sets - 10 reps Standing Gastroc Stretch at Counter - 2 x daily - 7 x weekly - 1 sets - 3 reps - 30 sec hold         PT Education - 04/19/20 0928    Education Details See self-care; plans for d/c and return eval in 6 months.    Person(s) Educated Patient    Methods Explanation    Comprehension Verbalized understanding               PT Long Term Goals - 04/19/20 0932      PT LONG TERM GOAL #1   Title Pt will verbalize importance of continued HEP and tips to reduce freezing/festination with gait.    Time 1    Period Days    Status Achieved                 Plan - 04/19/20 0930    Clinical Impression Statement Reviewed  HEP, reviewed strategies and tips to reduce freezing/festination of gait (even with verbalizing and practice, pt has difficulty upon leaving PT session).  Pt overall reports no pain in PT session in his R foot; he has exercises and strategies he needs to continue at home; appropriate for d/c  this visit.    Personal Factors and Comorbidities Comorbidity 3+    Comorbidities 08/24/2019  R ankle fusion talonavicular/medial cuneiform arthrodesis, DVT/PE 07/2019, pneumonia, sepsis    Examination-Activity Limitations Locomotion Level;Transfers;Stand;Carry    Examination-Participation Restrictions Church;Community Activity;Other   Community exercise   Stability/Clinical Decision Making Evolving/Moderate complexity    Rehab Potential Good    PT Frequency Other (comment)   1 additional visit 04/19/2020   PT Treatment/Interventions ADLs/Self Care Home Management;DME Instruction;Moist Heat;Gait training;Functional mobility training;Therapeutic activities;Therapeutic exercise;Balance training;Neuromuscular re-education;Manual techniques;Patient/family education;Passive range of motion    PT Next Visit Plan d/c this visit, with pt planning return PT eval in 6 months.    Consulted and Agree with Plan of Care Patient           Patient will benefit from skilled therapeutic intervention in order to improve the following deficits and impairments:  Abnormal gait, Decreased range of motion, Difficulty walking, Impaired tone, Decreased activity tolerance, Pain, Decreased balance, Impaired flexibility, Decreased mobility, Decreased strength  Visit Diagnosis: Other abnormalities of gait and mobility  Other symptoms and signs involving the nervous system     Problem List Patient Active Problem List   Diagnosis Date Noted  . Primary osteoarthritis, right ankle and foot   . Lower extremity edema 08/10/2019  . Acute DVT (deep venous thrombosis) ([place]) 07/19/2019  . History of pulmonary embolism 07/19/2019  .  Parkinson's disease ([place]) 02/03/2018  . Community acquired pneumonia of left lower lobe of lung 12/23/2017  . Sepsis ([place]) 12/23/2017  . Morbid obesity due to excess calories ([place]) 08/23/2016  . OSA on CPAP 08/23/2016  . Dyspnea on exertion 08/22/2016  . Umbilical hernia 32/95/1884  . Persistent atrial fibrillation ([place])   . Encounter for therapeutic drug monitoring 08/12/2013  . Long term current use of anticoagulant 07/30/2010  . COLONIC POLYPS 06/03/2010  . Factor V Leiden ([place]) 06/03/2010  . GERD 06/03/2010  . HIATAL HERNIA 06/03/2010  . BENIGN PROSTATIC HYPERTROPHY, WITH OBSTRUCTION 06/03/2010  . PSORIASIS 06/03/2010    Ethell Blatchford W. 04/19/2020, 9:33 AM Frazier Butt., PT  [facility] [address] [place], [place], [address] Phone: [phone]   Fax:  [phone]  Name: Stanley Wright MRN: [id number] Date of Birth: Sep 17, 1945   PHYSICAL THERAPY DISCHARGE SUMMARY  Visits from Start of Care: 16  Current functional level related to goals / functional outcomes:  PT Long Term Goals - 04/19/20 0932      PT LONG TERM GOAL #1   Title Pt will verbalize importance of continued HEP and tips to reduce freezing/festination with gait.    Time 1    Period Days    Status Achieved          Gait velocity 2.91 ft/sec; able to ambulate 5 minutes without increase in pain in R foot (pt rates pain 0/10).   Remaining deficits: Occasional pain R foot; continued festination/freezing episodes     Education / Equipment: Tips to reduce festination/freezing of gait, fall prevention, HEP; indications to follow up with physician regarding foot pain.  Plan: Patient agrees to discharge.  Patient goals were met. Patient is being discharged due to meeting the stated rehab goals.  ?????     Mady Haagensen, PT 04/19/20 9:37 AM Phone: [phone] Fax: [phone]

## 2020-04-20 ENCOUNTER — Ambulatory Visit (INDEPENDENT_AMBULATORY_CARE_PROVIDER_SITE_OTHER): Payer: PPO | Admitting: Pharmacist

## 2020-04-20 DIAGNOSIS — I4819 Other persistent atrial fibrillation: Secondary | ICD-10-CM | POA: Diagnosis not present

## 2020-04-20 DIAGNOSIS — Z5181 Encounter for therapeutic drug level monitoring: Secondary | ICD-10-CM

## 2020-04-20 DIAGNOSIS — D6851 Activated protein C resistance: Secondary | ICD-10-CM | POA: Diagnosis not present

## 2020-04-20 LAB — POCT INR: INR: 5.1 — AB (ref 2.0–3.0)

## 2020-04-20 NOTE — Progress Notes (Signed)
Paitent informed me that he was taking hydroxychloroquine for COVID prophylaxis. He would not tell me who prescribed this for him. I advised that this is unsafe and has not been proven to be effective. He responded " we will find out." I warned again about the risk of an arrhythmia, but pt states he is going to still take.

## 2020-04-20 NOTE — Patient Instructions (Signed)
Hold warfarin today and tomorrow. On Sunday Take 1/2 tablet. Then start taking 1 tablet daily except for 1.5 tablets on Mondays and Fridays. Recheck INR in 10 days. Coumadin Clinic [phone]

## 2020-04-23 DIAGNOSIS — M542 Cervicalgia: Secondary | ICD-10-CM | POA: Diagnosis not present

## 2020-04-23 DIAGNOSIS — G44209 Tension-type headache, unspecified, not intractable: Secondary | ICD-10-CM | POA: Diagnosis not present

## 2020-04-23 DIAGNOSIS — M47812 Spondylosis without myelopathy or radiculopathy, cervical region: Secondary | ICD-10-CM | POA: Diagnosis not present

## 2020-04-23 DIAGNOSIS — M9901 Segmental and somatic dysfunction of cervical region: Secondary | ICD-10-CM | POA: Diagnosis not present

## 2020-04-25 DIAGNOSIS — M9901 Segmental and somatic dysfunction of cervical region: Secondary | ICD-10-CM | POA: Diagnosis not present

## 2020-04-25 DIAGNOSIS — G44209 Tension-type headache, unspecified, not intractable: Secondary | ICD-10-CM | POA: Diagnosis not present

## 2020-04-25 DIAGNOSIS — M47812 Spondylosis without myelopathy or radiculopathy, cervical region: Secondary | ICD-10-CM | POA: Diagnosis not present

## 2020-04-25 DIAGNOSIS — M542 Cervicalgia: Secondary | ICD-10-CM | POA: Diagnosis not present

## 2020-04-27 ENCOUNTER — Telehealth: Payer: Self-pay | Admitting: *Deleted

## 2020-04-27 DIAGNOSIS — I4821 Permanent atrial fibrillation: Secondary | ICD-10-CM | POA: Diagnosis not present

## 2020-04-27 MED ORDER — DILTIAZEM HCL ER COATED BEADS 360 MG PO CP24
360.0000 mg | ORAL_CAPSULE | Freq: Every day | ORAL | 3 refills | Status: DC
Start: 2020-04-27 — End: 2021-05-02

## 2020-04-27 NOTE — Telephone Encounter (Signed)
-----   Message from Lelon Perla, MD sent at 04/27/2020  2:16 PM EST ----- Increase Cardizem to 360 mg daily. Kirk Ruths

## 2020-04-27 NOTE — Telephone Encounter (Signed)
Spoke with pt, Aware of dr crenshaw's recommendations. New script sent to the pharmacy  

## 2020-04-30 DIAGNOSIS — G44209 Tension-type headache, unspecified, not intractable: Secondary | ICD-10-CM | POA: Diagnosis not present

## 2020-04-30 DIAGNOSIS — M47812 Spondylosis without myelopathy or radiculopathy, cervical region: Secondary | ICD-10-CM | POA: Diagnosis not present

## 2020-04-30 DIAGNOSIS — M542 Cervicalgia: Secondary | ICD-10-CM | POA: Diagnosis not present

## 2020-04-30 DIAGNOSIS — M9901 Segmental and somatic dysfunction of cervical region: Secondary | ICD-10-CM | POA: Diagnosis not present

## 2020-05-02 ENCOUNTER — Ambulatory Visit (INDEPENDENT_AMBULATORY_CARE_PROVIDER_SITE_OTHER): Payer: PPO | Admitting: *Deleted

## 2020-05-02 ENCOUNTER — Other Ambulatory Visit: Payer: Self-pay

## 2020-05-02 DIAGNOSIS — D6851 Activated protein C resistance: Secondary | ICD-10-CM

## 2020-05-02 DIAGNOSIS — Z5181 Encounter for therapeutic drug level monitoring: Secondary | ICD-10-CM | POA: Diagnosis not present

## 2020-05-02 DIAGNOSIS — I4819 Other persistent atrial fibrillation: Secondary | ICD-10-CM

## 2020-05-02 LAB — POCT INR: INR: 2.1 (ref 2.0–3.0)

## 2020-05-02 NOTE — Patient Instructions (Signed)
Description   Continue taking 1 tablet daily except for 1.5 tablets on Mondays and Fridays. Recheck INR in 3 weeks. Coumadin Clinic [phone]

## 2020-05-07 DIAGNOSIS — M47812 Spondylosis without myelopathy or radiculopathy, cervical region: Secondary | ICD-10-CM | POA: Diagnosis not present

## 2020-05-07 DIAGNOSIS — M542 Cervicalgia: Secondary | ICD-10-CM | POA: Diagnosis not present

## 2020-05-07 DIAGNOSIS — M9901 Segmental and somatic dysfunction of cervical region: Secondary | ICD-10-CM | POA: Diagnosis not present

## 2020-05-07 DIAGNOSIS — G44209 Tension-type headache, unspecified, not intractable: Secondary | ICD-10-CM | POA: Diagnosis not present

## 2020-05-09 ENCOUNTER — Telehealth: Payer: Self-pay

## 2020-05-09 DIAGNOSIS — M47812 Spondylosis without myelopathy or radiculopathy, cervical region: Secondary | ICD-10-CM | POA: Diagnosis not present

## 2020-05-09 DIAGNOSIS — G44209 Tension-type headache, unspecified, not intractable: Secondary | ICD-10-CM | POA: Diagnosis not present

## 2020-05-09 DIAGNOSIS — M9901 Segmental and somatic dysfunction of cervical region: Secondary | ICD-10-CM | POA: Diagnosis not present

## 2020-05-09 DIAGNOSIS — M542 Cervicalgia: Secondary | ICD-10-CM | POA: Diagnosis not present

## 2020-05-09 NOTE — Telephone Encounter (Signed)
   [facility] Pre-operative Risk Assessment    HEARTCARE STAFF: - Please ensure there is not already an duplicate clearance open for this procedure. - Under Visit Info/Reason for Call, type in Other and utilize the format Clearance MM/DD/YY or Clearance TBD. Do not use dashes or single digits. - If request is for dental extraction, please clarify the # of teeth to be extracted.  Request for surgical clearance:  1. What type of surgery is being performed? BILATERAL FOREHEAD LIFT W/PARTIAL MYECTOMY   2. When is this surgery scheduled? 05/28/20   3. What type of clearance is required (medical clearance vs. Pharmacy clearance to hold med vs. Both)? BOTH  4. Are there any medications that need to be held prior to surgery and how long?WARFARIN   5. Practice name and name of physician performing surgery? [facility]; DR. Elayne Snare Wright   6. What is the office phone number? [phone]   7.   What is the office fax number? [phone]  8.   Anesthesia type (None, local, MAC, general) ? MAC   Stanley Wright 05/09/2020, 5:03 PM  _________________________________________________________________   (provider comments below)

## 2020-05-14 DIAGNOSIS — M9901 Segmental and somatic dysfunction of cervical region: Secondary | ICD-10-CM | POA: Diagnosis not present

## 2020-05-14 DIAGNOSIS — M47812 Spondylosis without myelopathy or radiculopathy, cervical region: Secondary | ICD-10-CM | POA: Diagnosis not present

## 2020-05-14 DIAGNOSIS — G44209 Tension-type headache, unspecified, not intractable: Secondary | ICD-10-CM | POA: Diagnosis not present

## 2020-05-14 DIAGNOSIS — M542 Cervicalgia: Secondary | ICD-10-CM | POA: Diagnosis not present

## 2020-05-14 NOTE — Telephone Encounter (Signed)
Pt with need for surgery.  On coumadin for factor V leiden, and 2 pulmonary emboli.  Please direct coumadin for surgery thanks

## 2020-05-14 NOTE — Telephone Encounter (Signed)
Pt plans to be seen in coumadin clinic 05/22/20 to arrange and review

## 2020-05-14 NOTE — Telephone Encounter (Signed)
   Primary Cardiologist: Kirk Ruths, MD  Chart reviewed as part of pre-operative protocol coverage. Patient was contacted 05/14/2020 in reference to pre-operative risk assessment for pending surgery as outlined below.  Stanley Wright was last seen on 04/09/20 by Dr. Stanford Breed for permanent atrial fib and factor V Leiden and hx of recurrent pulmonary emboli.  Since that day, GRAY DOERING has done well meeting 4 Mets with activity.   He will need coumadin lovenox crossover and has appt with our pharmacist on 05/22/20 to arrange.  See their note attached.    Therefore, based on ACC/AHA guidelines, the patient would be at acceptable risk for the planned procedure without further cardiovascular testing.   The patient was advised that if he develops new symptoms prior to surgery to contact our office to arrange for a follow-up visit, and he verbalized understanding.  I will route this recommendation to the requesting party via Epic fax function and remove from pre-op pool. Please call with questions.  Cecilie Kicks, NP 05/14/2020, 10:05 AM

## 2020-05-14 NOTE — Telephone Encounter (Addendum)
Patient with diagnosis of factor V Leiden (2 prior PEs) and atrial fibrillation on warfarin (Coumadin) for anticoagulation.    Procedure: BILATERAL FOREHEAD LIFT W/PARTIAL MYECTOMY  Date of procedure: 05/28/20  CrCl 19ml/min using adjusted body weight  Platelet count: 169K  Per office protocol, patient can hold warfarin for 5 days prior to procedure.   Patient will need bridging with Lovenox (enoxaparin) around procedure.   Norina Buzzard, PharmD PGY1 Pharmacy Resident 05/14/2020 9:42 AM

## 2020-05-18 ENCOUNTER — Other Ambulatory Visit: Payer: Self-pay | Admitting: Neurology

## 2020-05-18 NOTE — Telephone Encounter (Signed)
Rx(s) sent to pharmacy electronically.  

## 2020-05-21 DIAGNOSIS — M9901 Segmental and somatic dysfunction of cervical region: Secondary | ICD-10-CM | POA: Diagnosis not present

## 2020-05-21 DIAGNOSIS — G44209 Tension-type headache, unspecified, not intractable: Secondary | ICD-10-CM | POA: Diagnosis not present

## 2020-05-21 DIAGNOSIS — M47812 Spondylosis without myelopathy or radiculopathy, cervical region: Secondary | ICD-10-CM | POA: Diagnosis not present

## 2020-05-21 DIAGNOSIS — M542 Cervicalgia: Secondary | ICD-10-CM | POA: Diagnosis not present

## 2020-05-21 NOTE — Telephone Encounter (Signed)
Pre-op risk assessment and Coumadin recommendations addressed on 05/14/2020. I will re-fax this to requested office. Called and left message for [place] notifying her that I would be re-faxing this.  Darreld Mclean, PA-C 05/21/2020 3:16 PM

## 2020-05-21 NOTE — Telephone Encounter (Signed)
Follow up:     [place] calling from Dr. Lorina Rabon office to check the status of the patient clearance.

## 2020-05-22 ENCOUNTER — Other Ambulatory Visit: Payer: Self-pay

## 2020-05-22 ENCOUNTER — Ambulatory Visit (INDEPENDENT_AMBULATORY_CARE_PROVIDER_SITE_OTHER): Payer: PPO

## 2020-05-22 DIAGNOSIS — I4819 Other persistent atrial fibrillation: Secondary | ICD-10-CM

## 2020-05-22 DIAGNOSIS — D6851 Activated protein C resistance: Secondary | ICD-10-CM | POA: Diagnosis not present

## 2020-05-22 DIAGNOSIS — Z5181 Encounter for therapeutic drug level monitoring: Secondary | ICD-10-CM | POA: Diagnosis not present

## 2020-05-22 LAB — POCT INR: INR: 3.2 — AB (ref 2.0–3.0)

## 2020-05-22 MED ORDER — ENOXAPARIN SODIUM 120 MG/0.8ML ~~LOC~~ SOLN: 120.0000 mg | Freq: Two times a day (BID) | SUBCUTANEOUS | 0 refills | Status: DC

## 2020-05-22 NOTE — Patient Instructions (Addendum)
05/22/20: Last dose of warfarin (1/2 tablet).  05/23/20: No warfarin or enoxaparin (Lovenox).  05/24/20: Inject enoxaparin 120mg  in the fatty abdominal tissue at least 2 inches from the belly button twice a day about 12 hours apart, 8am and 8pm rotate sites. No warfarin.  05/25/20: Inject enoxaparin in the fatty tissue every 12 hours, 8am and 8pm. No warfarin.  05/26/20: Inject enoxaparin in the fatty tissue every 12 hours, 8am and 8pm. No warfarin.  05/27/20: Inject enoxaparin in the fatty tissue in the morning at 8 am (No PM dose). No warfarin.  05/28/20: Procedure Day - No enoxaparin - Resume warfarin in the evening or as directed by doctor (take an extra half tablet with usual dose for 2 days then resume normal dose).  05/29/20: Resume enoxaparin inject in the fatty tissue every 12 hours and take warfarin  05/30/20: Inject enoxaparin in the fatty tissue every 12 hours and take warfarin  05/31/20: Inject enoxaparin in the fatty tissue every 12 hours and take warfarin  06/01/20: Inject enoxaparin in the fatty tissue every 12 hours and take warfarin  06/02/20: Inject enoxaparin in the fatty tissue every 12 hours and take warfarin  06/03/20: Inject enoxaparin in the fatty tissue every 12 hours and take warfarin  06/04/20: warfarin appt to check INR.

## 2020-05-28 DIAGNOSIS — H02423 Myogenic ptosis of bilateral eyelids: Secondary | ICD-10-CM | POA: Diagnosis not present

## 2020-05-28 DIAGNOSIS — S0181XS Laceration without foreign body of other part of head, sequela: Secondary | ICD-10-CM | POA: Diagnosis not present

## 2020-05-28 DIAGNOSIS — G245 Blepharospasm: Secondary | ICD-10-CM | POA: Diagnosis not present

## 2020-05-28 DIAGNOSIS — H02413 Mechanical ptosis of bilateral eyelids: Secondary | ICD-10-CM | POA: Diagnosis not present

## 2020-05-28 DIAGNOSIS — H57813 Brow ptosis, bilateral: Secondary | ICD-10-CM | POA: Diagnosis not present

## 2020-06-04 ENCOUNTER — Ambulatory Visit (INDEPENDENT_AMBULATORY_CARE_PROVIDER_SITE_OTHER): Payer: PPO | Admitting: Pharmacist

## 2020-06-04 ENCOUNTER — Other Ambulatory Visit: Payer: Self-pay

## 2020-06-04 DIAGNOSIS — Z5181 Encounter for therapeutic drug level monitoring: Secondary | ICD-10-CM

## 2020-06-04 DIAGNOSIS — I4819 Other persistent atrial fibrillation: Secondary | ICD-10-CM

## 2020-06-04 DIAGNOSIS — G44209 Tension-type headache, unspecified, not intractable: Secondary | ICD-10-CM | POA: Diagnosis not present

## 2020-06-04 DIAGNOSIS — D6851 Activated protein C resistance: Secondary | ICD-10-CM

## 2020-06-04 DIAGNOSIS — M9901 Segmental and somatic dysfunction of cervical region: Secondary | ICD-10-CM | POA: Diagnosis not present

## 2020-06-04 DIAGNOSIS — M47812 Spondylosis without myelopathy or radiculopathy, cervical region: Secondary | ICD-10-CM | POA: Diagnosis not present

## 2020-06-04 DIAGNOSIS — M542 Cervicalgia: Secondary | ICD-10-CM | POA: Diagnosis not present

## 2020-06-04 LAB — POCT INR: INR: 3 (ref 2.0–3.0)

## 2020-06-04 IMAGING — CR DG FOOT COMPLETE 3+V*R*
3 series · 3 of 3 positions shown · non-contrast
Comparison: None.

CLINICAL DATA: Right foot pain

EXAM:
RIGHT FOOT COMPLETE - 3+ VIEW

[x foot ap right]
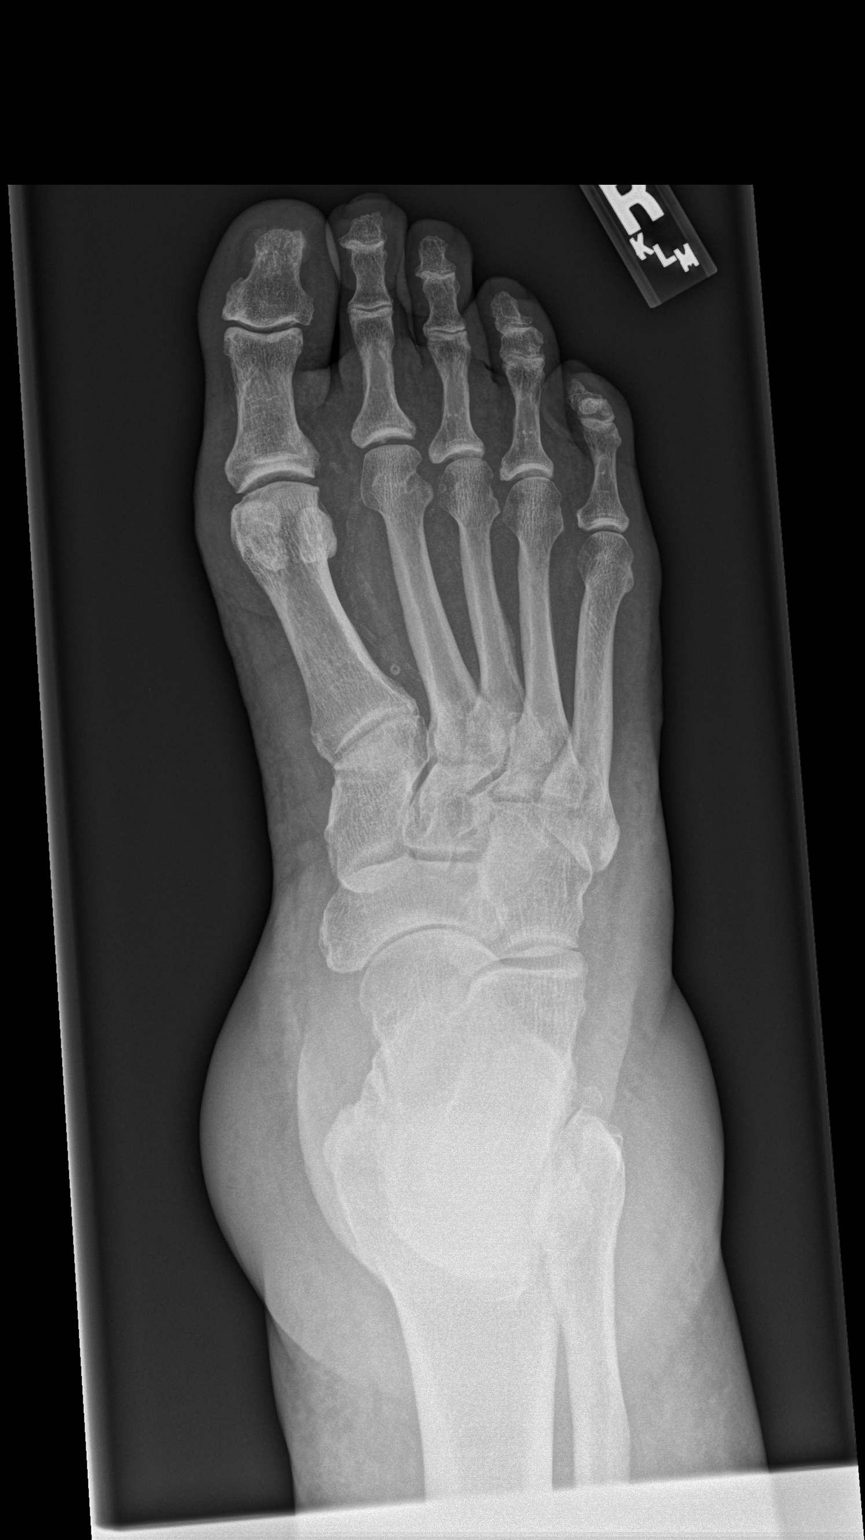

[x foot obl right]
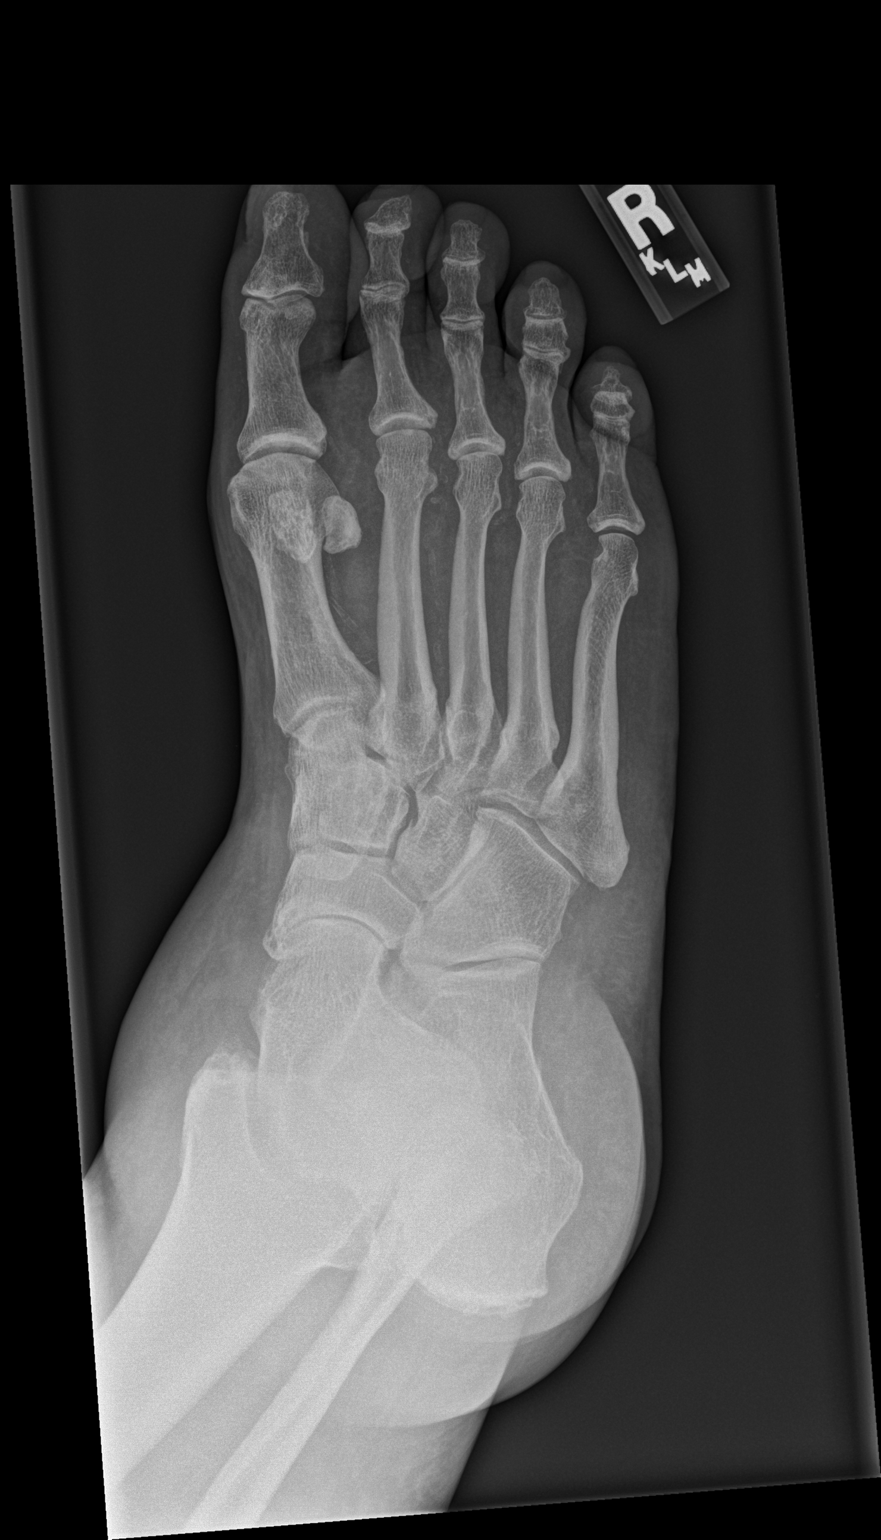

[x foot lat right]
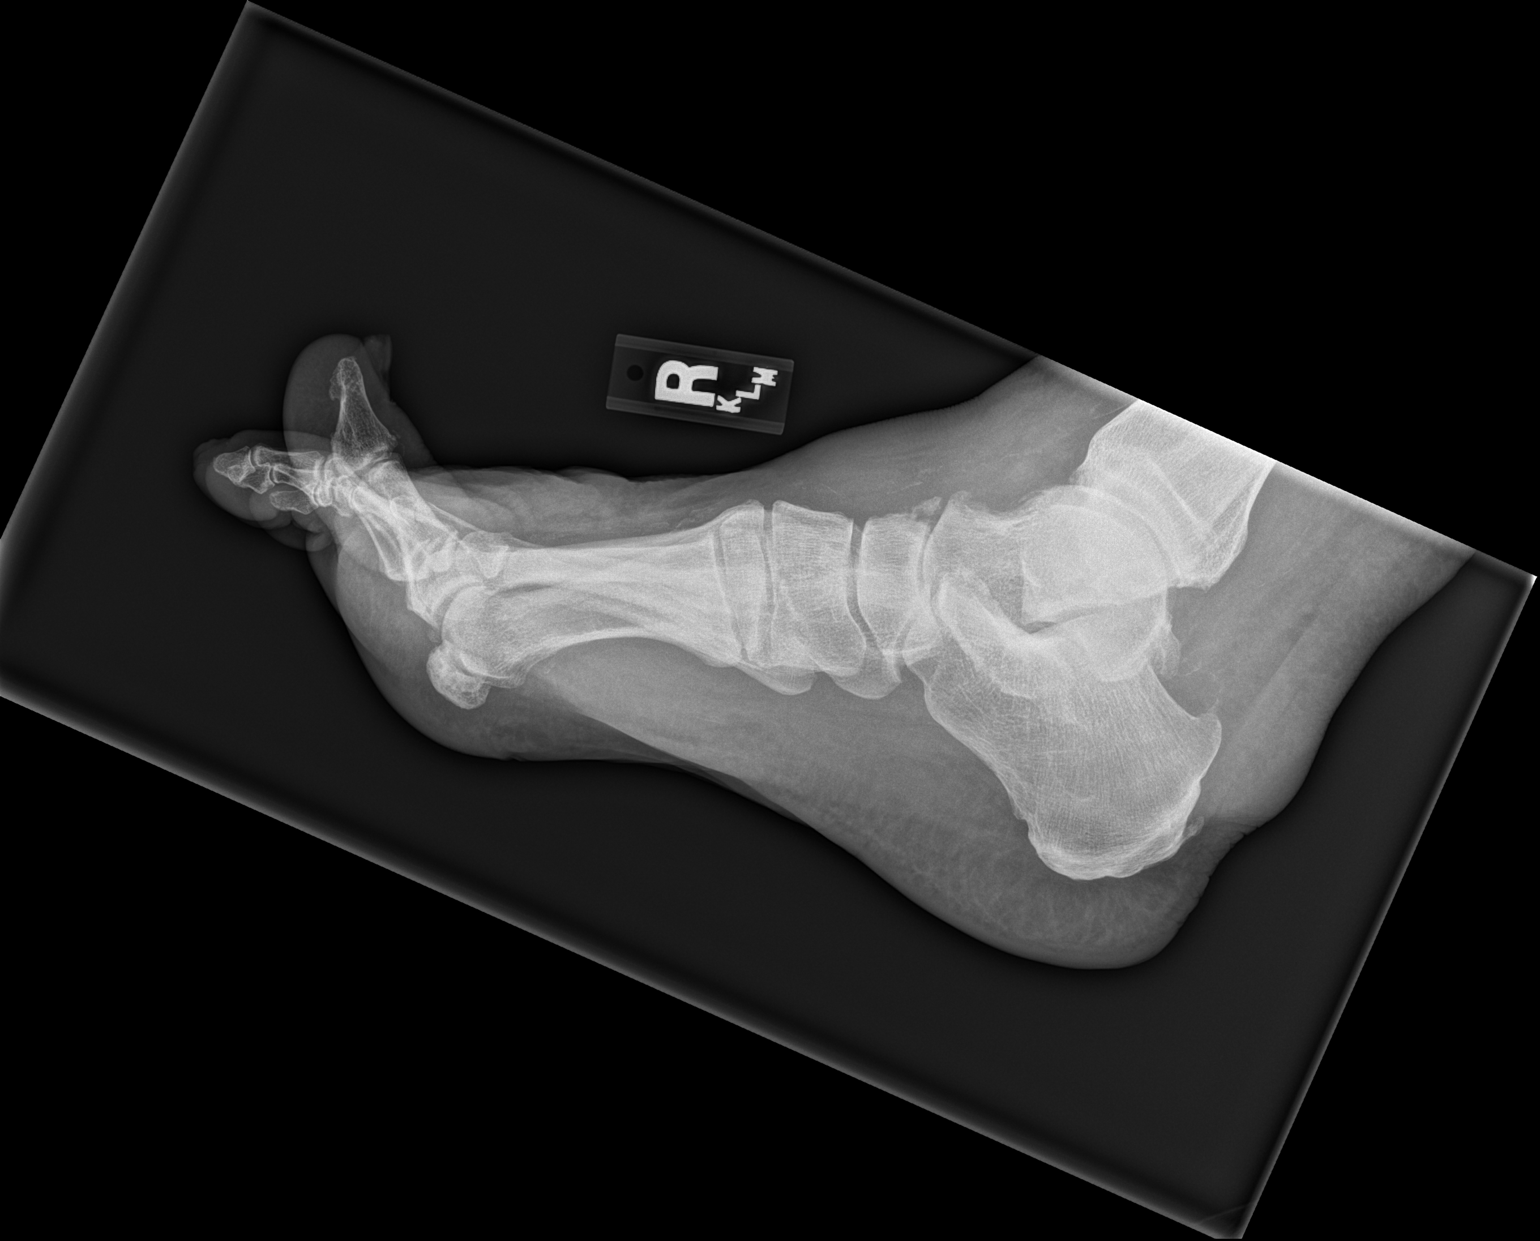

[3 of 3 positions shown; findings below may reference images not displayed]

FINDINGS: There is no evidence of fracture or dislocation. There is moderate
talonavicular joint osteoarthritis. An osseous fragment just
superior to the navicular bone may represent a fracture fragment.
There is overlying soft tissue swelling. There is a posterior
calcaneal enthesophyte.
IMPRESSION: Moderate talonavicular joint osteoarthritis. An osseous fragment
superior to the navicular bone may represent a fracture fragment.

## 2020-06-04 NOTE — Patient Instructions (Signed)
Description   Stop your Lovenox injections. Continue taking Coumadin 1 tablet daily except for 1.5 tablets on Mondays and Fridays. Recheck INR in 4 weeks.  [facility] [phone]

## 2020-06-13 DIAGNOSIS — M9901 Segmental and somatic dysfunction of cervical region: Secondary | ICD-10-CM | POA: Diagnosis not present

## 2020-06-13 DIAGNOSIS — G44209 Tension-type headache, unspecified, not intractable: Secondary | ICD-10-CM | POA: Diagnosis not present

## 2020-06-13 DIAGNOSIS — M47812 Spondylosis without myelopathy or radiculopathy, cervical region: Secondary | ICD-10-CM | POA: Diagnosis not present

## 2020-06-13 DIAGNOSIS — M542 Cervicalgia: Secondary | ICD-10-CM | POA: Diagnosis not present

## 2020-06-20 DIAGNOSIS — M9901 Segmental and somatic dysfunction of cervical region: Secondary | ICD-10-CM | POA: Diagnosis not present

## 2020-06-20 DIAGNOSIS — G44209 Tension-type headache, unspecified, not intractable: Secondary | ICD-10-CM | POA: Diagnosis not present

## 2020-06-20 DIAGNOSIS — M47812 Spondylosis without myelopathy or radiculopathy, cervical region: Secondary | ICD-10-CM | POA: Diagnosis not present

## 2020-06-20 DIAGNOSIS — M542 Cervicalgia: Secondary | ICD-10-CM | POA: Diagnosis not present

## 2020-06-29 ENCOUNTER — Ambulatory Visit (INDEPENDENT_AMBULATORY_CARE_PROVIDER_SITE_OTHER): Payer: PPO

## 2020-06-29 ENCOUNTER — Other Ambulatory Visit: Payer: Self-pay

## 2020-06-29 DIAGNOSIS — I4819 Other persistent atrial fibrillation: Secondary | ICD-10-CM

## 2020-06-29 DIAGNOSIS — Z5181 Encounter for therapeutic drug level monitoring: Secondary | ICD-10-CM

## 2020-06-29 DIAGNOSIS — D6851 Activated protein C resistance: Secondary | ICD-10-CM

## 2020-06-29 LAB — POCT INR: INR: 2.3 (ref 2.0–3.0)

## 2020-06-29 NOTE — Patient Instructions (Signed)
Description   Continue taking Coumadin 1 tablet daily except for 1.5 tablets on Mondays and Fridays. Recheck INR in 4 weeks.  [facility] [phone]

## 2020-07-11 DIAGNOSIS — M47812 Spondylosis without myelopathy or radiculopathy, cervical region: Secondary | ICD-10-CM | POA: Diagnosis not present

## 2020-07-11 DIAGNOSIS — M542 Cervicalgia: Secondary | ICD-10-CM | POA: Diagnosis not present

## 2020-07-11 DIAGNOSIS — G44209 Tension-type headache, unspecified, not intractable: Secondary | ICD-10-CM | POA: Diagnosis not present

## 2020-07-11 DIAGNOSIS — M9901 Segmental and somatic dysfunction of cervical region: Secondary | ICD-10-CM | POA: Diagnosis not present

## 2020-07-27 ENCOUNTER — Ambulatory Visit (INDEPENDENT_AMBULATORY_CARE_PROVIDER_SITE_OTHER): Payer: PPO

## 2020-07-27 ENCOUNTER — Other Ambulatory Visit: Payer: Self-pay

## 2020-07-27 DIAGNOSIS — Z5181 Encounter for therapeutic drug level monitoring: Secondary | ICD-10-CM | POA: Diagnosis not present

## 2020-07-27 DIAGNOSIS — D6851 Activated protein C resistance: Secondary | ICD-10-CM | POA: Diagnosis not present

## 2020-07-27 DIAGNOSIS — I4819 Other persistent atrial fibrillation: Secondary | ICD-10-CM | POA: Diagnosis not present

## 2020-07-27 LAB — POCT INR: INR: 2.2 (ref 2.0–3.0)

## 2020-07-27 NOTE — Patient Instructions (Signed)
Description   Continue taking Coumadin 1 tablet daily except for 1.5 tablets on Mondays and Fridays. Recheck INR in 6 weeks.  [facility] [phone]

## 2020-08-09 DIAGNOSIS — G20 Parkinson's disease: Secondary | ICD-10-CM | POA: Diagnosis not present

## 2020-08-09 DIAGNOSIS — H35033 Hypertensive retinopathy, bilateral: Secondary | ICD-10-CM | POA: Diagnosis not present

## 2020-08-09 DIAGNOSIS — I5032 Chronic diastolic (congestive) heart failure: Secondary | ICD-10-CM | POA: Diagnosis not present

## 2020-08-18 ENCOUNTER — Other Ambulatory Visit: Payer: Self-pay | Admitting: Neurology

## 2020-08-20 ENCOUNTER — Encounter: Payer: Self-pay | Admitting: Orthopedic Surgery

## 2020-08-21 ENCOUNTER — Ambulatory Visit: Payer: Self-pay

## 2020-08-21 ENCOUNTER — Encounter: Payer: Self-pay | Admitting: Family

## 2020-08-21 ENCOUNTER — Ambulatory Visit: Payer: PPO | Admitting: Family

## 2020-08-21 DIAGNOSIS — M79671 Pain in right foot: Secondary | ICD-10-CM | POA: Diagnosis not present

## 2020-08-21 DIAGNOSIS — S92354A Nondisplaced fracture of fifth metatarsal bone, right foot, initial encounter for closed fracture: Secondary | ICD-10-CM | POA: Diagnosis not present

## 2020-08-21 NOTE — Progress Notes (Signed)
Office Visit Note   Patient: DEMPSY Wright           Date of Birth: [birth year]           MRN: [id number] Visit Date: 08/21/2020              Requested by: Maude Leriche, PA-C [place],  [place] [address] PCP: Maude Leriche, PA-C  Chief Complaint  Patient presents with  . Right Foot - Pain      HPI: The patient is a 75 year old gentleman who presents today for initial evaluation of an injury to the right foot.  States on Saturday he was walking when he felt and heard a pop in his right foot he has had pain with weightbearing as well as worsening of his swelling since the injury.  Pain along the lateral side of his right foot as well as over the dorsum of his foot there is no associated bruising.  He is status post right talonavicular fusion March 10 of last year  Assessment & Plan: Visit Diagnoses:  1. Nondisplaced fracture of fifth metatarsal bone, right foot, initial encounter for closed fracture   2. Pain in right foot     Plan: Placed him in a cam walker.  He is to remain nonweightbearing for the next 2 weeks.  He will follow-up with repeat radiographs of the right foot. Discussed possibility of surgical intervention for nonunion  Follow-Up Instructions: No follow-ups on file.   Ortho Exam  Patient is alert, oriented, no adenopathy, well-dressed, normal affect, normal respiratory effort. On examination of the right foot and ankle there is significant edema however this is his baseline.  He is tender along the base of the fifth metatarsal.  There is no ecchymosis.  Palpable dorsalis pedis pulse.  Imaging: No results found. No images are attached to the encounter.  Labs: Lab Results  Component Value Date   HGBA1C 6.3 11/21/2015   HGBA1C 6.2 06/12/2015   REPTSTATUS 12/24/2017 FINAL 12/23/2017   CULT  12/23/2017    NO GROWTH Performed at [facility], [place] [address], [place], [place] [address]    LABORGA ESCHERICHIA COLI 12/22/2017      Lab Results  Component Value Date   ALBUMIN 4.1 01/17/2019   ALBUMIN 2.9 (L) 12/26/2017   ALBUMIN 3.0 (L) 12/25/2017    Lab Results  Component Value Date   MG 1.9 12/25/2017   No results found for: VD25OH  No results found for: PREALBUMIN CBC EXTENDED Latest Ref Rng & Units 08/24/2019 12/26/2017 12/25/2017  WBC 4.0 - 10.5 K/uL 4.7 4.7 4.8  RBC 4.22 - 5.81 MIL/uL 4.36 4.22 4.47  HGB 13.0 - 17.0 g/dL 14.1 13.6 14.3  HCT 39.0 - 52.0 % 43.7 40.8 43.3  PLT 150 - 400 K/uL 169 118(L) 107(L)  NEUTROABS 1.7 - 7.7 K/uL - - -  LYMPHSABS 0.7 - 4.0 K/uL - - -     There is no height or weight on file to calculate BMI.  Orders:  Orders Placed This Encounter  Procedures  . XR Foot Complete Right   No orders of the defined types were placed in this encounter.    Procedures: No procedures performed  Clinical Data: No additional findings.  ROS:  All other systems negative, except as noted in the HPI. Review of Systems  Objective: Vital Signs: There were no vitals taken for this visit.  Specialty Comments:  No specialty comments available.  PMFS History: Patient  Active Problem List   Diagnosis Date Noted  . Primary osteoarthritis, right ankle and foot   . Lower extremity edema 08/10/2019  . Acute DVT (deep venous thrombosis) ([facility]) 07/19/2019  . History of pulmonary embolism 07/19/2019  . Parkinson's disease ([facility]) 02/03/2018  . Community acquired pneumonia of left lower lobe of lung 12/23/2017  . Sepsis ([facility]) 12/23/2017  . Morbid obesity due to excess calories ([place]) 08/23/2016  . OSA on CPAP 08/23/2016  . Dyspnea on exertion 08/22/2016  . Umbilical hernia 16/03/9603  . Persistent atrial fibrillation ([facility])   . Encounter for therapeutic drug monitoring 08/12/2013  . Long term current use of anticoagulant 07/30/2010  . COLONIC POLYPS 06/03/2010  . Factor V Leiden ([facility]) 06/03/2010  . GERD 06/03/2010  . HIATAL HERNIA 06/03/2010  . BENIGN PROSTATIC HYPERTROPHY, WITH  OBSTRUCTION 06/03/2010  . PSORIASIS 06/03/2010   Past Medical History:  Diagnosis Date  . Arthritis   . BENIGN PROSTATIC HYPERTROPHY, WITH OBSTRUCTION   . Cancer (HCC)    skin, basal, squamous  . COLONIC POLYPS   . Diverticulitis   . DVT (deep venous thrombosis) ([facility]) 2000  . Dysrhythmia    Hx Afib- 2017  . Early cataract   . Factor 5 Leiden mutation, heterozygous ([place])   . GERD (gastroesophageal reflux disease)    not on medication  . Hearing aid worn    B/L  . HIATAL HERNIA   . ITP (idiopathic thrombocytopenic purpura) 2003  . Long term current use of anticoagulant   . Osteoarthritis    right foot  . Parkinson's disease ([facility]) 02/03/2018  . PSORIASIS   . Pulmonary embolus ([facility]) 2000  . Shortness of breath dyspnea    at times - Talking alot  . Sleep apnea   . Wears glasses     Family History  Problem Relation Age of Onset  . Cancer Father   . Breast cancer Sister   . Heart disease Brother   . Cancer Maternal Grandmother   . Stroke Paternal Grandfather     Past Surgical History:  Procedure Laterality Date  . ANKLE FUSION Right 08/24/2019   Procedure: RIGHT TALO-NAVICULAR FUSION;  Surgeon: Newt Minion, MD;  Location: [facility];  Service: Orthopedics;  Laterality: Right;  . CARDIOVERSION N/A 11/22/2015   Procedure: CARDIOVERSION;  Surgeon: Sanda Klein, MD;  [facility];  Service: Cardiovascular;  Laterality: N/A;  . COLONOSCOPY    . HERNIA REPAIR Left    Inguinal- x2 . mesh x1  . INSERTION OF MESH N/A 02/25/2016   Procedure: INSERTION OF MESH;  Surgeon: Rolm Bookbinder, MD;  Location: [facility];  Service: General;  Laterality: N/A;  . LUNG REMOVAL, PARTIAL Right 2004   was fungal and not cancerous  . PROSTATE SURGERY    . UMBILICAL HERNIA REPAIR N/A 02/25/2016   Procedure: LAPAROSCOPIC UMBILICAL HERNIA REPAIR;  Surgeon: Rolm Bookbinder, MD;  Location: [facility];  Service: General;  Laterality: N/A;   Social History   Occupational History    Employer:  RETIRED    Comment: Engineer, structural  Tobacco Use  . Smoking status: Former Smoker    Packs/day: 1.00    Quit date: 06/16/1985    Years since quitting: 35.2  . Smokeless tobacco: Never Used  . Tobacco comment: quit approx age 16-   Vaping Use  . Vaping Use: Never used  Substance and Sexual Activity  . Alcohol use: No  . Drug use: No  . Sexual activity: Yes

## 2020-08-21 NOTE — Addendum Note (Signed)
Addended by: Ulice Brilliant T on: 08/21/2020 03:57 PM   Modules accepted: Orders

## 2020-08-23 NOTE — Progress Notes (Signed)
Assessment/Plan:   1.   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               -Take carbidopa/levodopa 25/100, 1 tablet 6 times per day.  He has been increasing the dose, taking more in the middle of the night hoping to help him sleep, and told him not to do that.  -Continue carbidopa/levodopa 50/200 at bed  -He has Inbrija, but had trouble using it.  Told him he could stop that. -The patienthad a DaTscan in February, 2020 and there was near absent dopamine in the bilateral putamen.   -May do on/off test in the future but couldn't do last time b/c he wasn't healed from R foot surgery.  In addition, he is back in a cam boot with a nondisplaced fracture of the fifth metatarsal, so cannot schedule that right now.  2. Cervical dystonia -Patient wants to hold on Botox.  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  -had surgery on eyes and it didn't help  4.dysphagia -mild. Doesn't want to do MBE at this time  5.  Atrial fibrillation             -Following with cardiology.  On diltiazem.  6.  Factor V Leiden             -On Coumadin.  7.  RBD  -We will cautiously start clonazepam, 0.5 mg, half tablet at bedtime.  Discussed extensively risks, benefits, side effects, including potential addictive nature.  He does not get up in the middle of the night.  While he urinates multiple times in the middle of the night currently uses a jug to hold the urine near his recliner and is not actually walked in the middle of the night.  PDMP reviewed.   Subjective:   Stanley Wright was seen today in follow up for Parkinsons disease.  My previous records were reviewed prior to todays visit as well as outside records available to me. No Falls.  Mood has been good.  He was seen by the orthopedic nurse practitioner on March 8 after walking and feeling pain in  the right foot.  Was told that he had a nondisplaced fracture of the fifth metatarsal.  He is now in a cam boot.  We gave him Inbrija last visit.  He reports that he had trouble working the mechanism ("you get a little bit of moisture and it quit working so I broke it up like cocaine and sucked it up."  No hallucinations but very vivid dreams.  Waking up tearing at clothes.  Sleeping in recliner.  Having some restless legs.  Trouble sleeping at night.  He is taking extra levodopa in the middle of the night, hoping it helps him sleep.  [facility] ran out of the carbidopa/levodopa 25/100, so he has been splitting the 50/200 during the day.  No lightheadedness/near syncope.  Has seen cardiology since last visit.  Records reviewed.  Current prescribed movement disorder medications: Carbidopa/levodopa 25/100, 1 tablet 5 times per day  - 6am/10am/2pm/6pm per patient (not taking middle of the night dosage but c/o fact that carbidopa/levodopa 50/200 is not working but 1/2 the night):  He reports he is taking 7-9 of those of per day (looks he is using many at night to try and sleep) Carbidopa/levodopa to 50/200 at bedtime Inbrija (started last visit for first morning on)  PREVIOUS MEDICATIONS: Sinemet and Sinemet CR; Inbrija (patient  had trouble using it)  ALLERGIES:   Allergies  Allergen Reactions  . Tylenol [Acetaminophen] Other (See Comments)    Pt states he had a DNA test completed that stated he should never take tylenol.    CURRENT MEDICATIONS:  Outpatient Encounter Medications as of 08/27/2020  Medication Sig  . carbidopa-levodopa (SINEMET CR) 50-200 MG tablet Take 1 tablet by mouth at bedtime.  . carbidopa-levodopa (SINEMET IR) 25-100 MG tablet TAKE 1 TABLET BY MOUTH FOUR TO  FIVE TIMES DAILY  . diltiazem (CARDIZEM CD) 360 MG 24 hr capsule Take 1 capsule (360 mg total) by mouth daily.  Marland Kitchen enoxaparin (LOVENOX) 120 MG/0.8ML injection Inject 0.8 mLs (120 mg total) into the skin every 12 (twelve) hours.  As Instructed by Anticoagulation Clinic.  . furosemide (LASIX) 40 MG tablet Take 1 tablet (40 mg total) by mouth daily.  Marland Kitchen warfarin (COUMADIN) 5 MG tablet TAKE 1-1&1/2  TABLETS BY MOUTH ONCE DAILY OR AS DIRECTED BY COUMADIN CLINIC   No facility-administered encounter medications on file as of 08/27/2020.    Objective:   PHYSICAL EXAMINATION:    VITALS:   Vitals:   08/27/20 0756  BP: 116/74  Pulse: 76  SpO2: 97%  Weight: 251 lb (113.9 kg)  Height: 6' (1.829 m)    GEN:  The patient appears stated age and is in NAD. HEENT:  Normocephalic, atraumatic.  The mucous membranes are moist.  Neck/HEME:  There are no carotid bruits bilaterally.  Chin approximates the chest.  Neck is flexed.  Neurological examination:  Orientation: The patient is alert and oriented x3. Cranial nerves: There is good facial symmetry with facial hypomimia.  He has complete upgaze paresis and mild downgaze paresis.  The speech is fluent and dysarthric. Soft palate rises symmetrically and there is no tongue deviation. Hearing is intact to conversational tone. Sensation: Sensation is intact to light touch throughout Motor: Strength is at least antigravity x4.  Movement examination: Tone: There is nl tone in the UE Abnormal movements: none Coordination:  There is decremation with RAM's, with any form of RAMS, including alternating supination and pronation of the forearm, hand opening and closing, finger taps, heel taps and toe taps on the L>R Gait and Station: Unable.  Right foot in a cam boot and patient not able to walk currently  I have reviewed and interpreted the following labs independently    Chemistry      Component Value Date/Time   NA 143 09/12/2019 1455   K 4.9 09/12/2019 1455   CL 104 09/12/2019 1455   CO2 25 09/12/2019 1455   BUN 24 09/12/2019 1455   CREATININE 1.22 09/12/2019 1455   CREATININE 0.88 06/12/2015 1505      Component Value Date/Time   CALCIUM 9.5 09/12/2019 1455   ALKPHOS 62  01/17/2019 1321   AST 12 01/17/2019 1321   ALT 6 01/17/2019 1321   BILITOT 0.4 01/17/2019 1321       Lab Results  Component Value Date   WBC 4.7 08/24/2019   HGB 14.1 08/24/2019   HCT 43.7 08/24/2019   MCV 100.2 (H) 08/24/2019   PLT 169 08/24/2019    Lab Results  Component Value Date   TSH 2.88 10/08/2015     Total time spent on today's visit was 30 minutes, including both face-to-face time and nonface-to-face time.  Time included that spent on review of records (prior notes available to me/labs/imaging if pertinent), discussing treatment and goals, answering patient's questions and coordinating care.  Cc:  [place], Earlie Server, [employer]

## 2020-08-27 ENCOUNTER — Ambulatory Visit: Payer: PPO | Admitting: Neurology

## 2020-08-27 ENCOUNTER — Telehealth: Payer: Self-pay

## 2020-08-27 ENCOUNTER — Other Ambulatory Visit: Payer: Self-pay

## 2020-08-27 ENCOUNTER — Encounter: Payer: Self-pay | Admitting: Neurology

## 2020-08-27 ENCOUNTER — Other Ambulatory Visit: Payer: Self-pay | Admitting: Cardiology

## 2020-08-27 VITALS — BP 116/74 | HR 76 | Ht 72.0 in | Wt 251.0 lb

## 2020-08-27 DIAGNOSIS — G20 Parkinson's disease: Secondary | ICD-10-CM

## 2020-08-27 MED ORDER — CLONAZEPAM 0.5 MG PO TABS: 0.2500 mg | ORAL_TABLET | Freq: Every day | ORAL | 1 refills | Status: DC

## 2020-08-27 MED ORDER — CARBIDOPA-LEVODOPA 25-100 MG PO TABS: ORAL_TABLET | ORAL | 1 refills | Status: DC

## 2020-08-27 NOTE — Patient Instructions (Signed)
1.  Take carbidopa/levodopa 25/100, 1 tablet 5-6 times per day 2.  Take carbidopa/levodopa 50/200 at bedtime 3.  Start clonazepam, 0.5 mg, 1/2 tablet AT BEDTIME.  This is a controlled substance and it will make you sleepy.

## 2020-08-27 NOTE — Telephone Encounter (Signed)
Per Dr Carles Collet I contacted pharmacy to see if they had the patients carbidopa levodopa in stock. Patient was seen in the office today.   Spoke with patient and made him aware the the pharmacist stated they have his rx and it has been ready for the past four days. He voiced understanding and stated he will go pick up his medication.   Dr Tat made aware

## 2020-09-04 ENCOUNTER — Ambulatory Visit (INDEPENDENT_AMBULATORY_CARE_PROVIDER_SITE_OTHER): Payer: PPO | Admitting: Family

## 2020-09-04 ENCOUNTER — Ambulatory Visit: Payer: Self-pay

## 2020-09-04 ENCOUNTER — Encounter: Payer: Self-pay | Admitting: Family

## 2020-09-04 DIAGNOSIS — H35033 Hypertensive retinopathy, bilateral: Secondary | ICD-10-CM | POA: Diagnosis not present

## 2020-09-04 DIAGNOSIS — M79671 Pain in right foot: Secondary | ICD-10-CM

## 2020-09-04 DIAGNOSIS — G20 Parkinson's disease: Secondary | ICD-10-CM | POA: Diagnosis not present

## 2020-09-04 DIAGNOSIS — S92354A Nondisplaced fracture of fifth metatarsal bone, right foot, initial encounter for closed fracture: Secondary | ICD-10-CM | POA: Diagnosis not present

## 2020-09-04 DIAGNOSIS — I5032 Chronic diastolic (congestive) heart failure: Secondary | ICD-10-CM | POA: Diagnosis not present

## 2020-09-04 NOTE — Progress Notes (Signed)
Office Visit Note   Patient: Stanley Wright           Date of Birth: 06-14-1946           MRN: [id number] Visit Date: 09/04/2020              Requested by: Maude Leriche, PA-C [place],  [place] [address] PCP: Scifres, Dorothy, PA-C  No chief complaint on file.     HPI: The patient is a 75 year old gentleman who presents today in follow-up for fifth metatarsal fracture on the right.  Today he is in a Cam walker using a wheelchair for mobility.  Has had improvement in his pain which is only over the lateral column.  States he can ambulate in the boot without pain.   He is status post right talonavicular fusion March 10 of last year.  Assessment & Plan: Visit Diagnoses:  1. Pain in right foot   2. Nondisplaced fracture of fifth metatarsal bone, right foot, initial encounter for closed fracture     Plan: He will continue his cam walker for weightbearing.  May weight-bear as tolerated in the cam walker. radiographs of the right foot follow-up  Follow-Up Instructions: No follow-ups on file.   Ortho Exam  Patient is alert, oriented, no adenopathy, well-dressed, normal affect, normal respiratory effort. On examination of the right foot and ankle there is stable, baseline edema.  He is tender along the base of the fifth metatarsal.  There is no ecchymosis.  Palpable dorsalis pedis pulse.  Imaging: No results found. No images are attached to the encounter.  Labs: Lab Results  Component Value Date   HGBA1C 6.3 11/21/2015   HGBA1C 6.2 06/12/2015   REPTSTATUS 12/24/2017 FINAL 12/23/2017   CULT  12/23/2017    NO GROWTH Performed at [facility], [place] [address], [place], [place] [address]    LABORGA ESCHERICHIA COLI 12/22/2017     Lab Results  Component Value Date   ALBUMIN 4.1 01/17/2019   ALBUMIN 2.9 (L) 12/26/2017   ALBUMIN 3.0 (L) 12/25/2017    Lab Results  Component Value Date   MG 1.9 12/25/2017   No results found for:  VD25OH  No results found for: PREALBUMIN CBC EXTENDED Latest Ref Rng & Units 08/24/2019 12/26/2017 12/25/2017  WBC 4.0 - 10.5 K/uL 4.7 4.7 4.8  RBC 4.22 - 5.81 MIL/uL 4.36 4.22 4.47  HGB 13.0 - 17.0 g/dL 14.1 13.6 14.3  HCT 39.0 - 52.0 % 43.7 40.8 43.3  PLT 150 - 400 K/uL 169 118(L) 107(L)  NEUTROABS 1.7 - 7.7 K/uL - - -  LYMPHSABS 0.7 - 4.0 K/uL - - -     There is no height or weight on file to calculate BMI.  Orders:  Orders Placed This Encounter  Procedures  . XR Foot Complete Right   No orders of the defined types were placed in this encounter.    Procedures: No procedures performed  Clinical Data: No additional findings.  ROS:  All other systems negative, except as noted in the HPI. Review of Systems  Constitutional: Negative for chills and fever.  Musculoskeletal: Positive for arthralgias and myalgias.    Objective: Vital Signs: There were no vitals taken for this visit.  Specialty Comments:  No specialty comments available.  PMFS History: Patient Active Problem List   Diagnosis Date Noted  . Primary osteoarthritis, right ankle and foot   . Lower extremity edema 08/10/2019  . Acute DVT (deep venous  thrombosis) ([place]) 07/19/2019  . History of pulmonary embolism 07/19/2019  . Parkinson's disease ([place]) 02/03/2018  . Community acquired pneumonia of left lower lobe of lung 12/23/2017  . Sepsis ([place]) 12/23/2017  . Morbid obesity due to excess calories ([place]) 08/23/2016  . OSA on CPAP 08/23/2016  . Dyspnea on exertion 08/22/2016  . Umbilical hernia 56/25/6389  . Persistent atrial fibrillation ([place])   . Encounter for therapeutic drug monitoring 08/12/2013  . Long term current use of anticoagulant 07/30/2010  . COLONIC POLYPS 06/03/2010  . Factor V Leiden ([place]) 06/03/2010  . GERD 06/03/2010  . HIATAL HERNIA 06/03/2010  . BENIGN PROSTATIC HYPERTROPHY, WITH OBSTRUCTION 06/03/2010  . PSORIASIS 06/03/2010   Past Medical History:  Diagnosis Date  . Arthritis    . BENIGN PROSTATIC HYPERTROPHY, WITH OBSTRUCTION   . Cancer (HCC)    skin, basal, squamous  . COLONIC POLYPS   . Diverticulitis   . DVT (deep venous thrombosis) ([place]) 2000  . Dysrhythmia    Hx Afib- 2017  . Early cataract   . Factor 5 Leiden mutation, heterozygous ([place])   . GERD (gastroesophageal reflux disease)    not on medication  . Hearing aid worn    B/L  . HIATAL HERNIA   . ITP (idiopathic thrombocytopenic purpura) 2003  . Long term current use of anticoagulant   . Osteoarthritis    right foot  . Parkinson's disease ([place]) 02/03/2018  . PSORIASIS   . Pulmonary embolus ([place]) 2000  . Shortness of breath dyspnea    at times - Talking alot  . Sleep apnea   . Wears glasses     Family History  Problem Relation Age of Onset  . Cancer Father   . Breast cancer Sister   . Heart disease Brother   . Cancer Maternal Grandmother   . Stroke Paternal Grandfather     Past Surgical History:  Procedure Laterality Date  . ANKLE FUSION Right 08/24/2019   Procedure: RIGHT TALO-NAVICULAR FUSION;  Surgeon: Newt Minion, MD;  Location: [facility];  Service: Orthopedics;  Laterality: Right;  . CARDIOVERSION N/A 11/22/2015   Procedure: CARDIOVERSION;  Surgeon: Sanda Klein, MD;  [facility];  Service: Cardiovascular;  Laterality: N/A;  . COLONOSCOPY    . HERNIA REPAIR Left    Inguinal- x2 . mesh x1  . INSERTION OF MESH N/A 02/25/2016   Procedure: INSERTION OF MESH;  Surgeon: Rolm Bookbinder, MD;  Location: [facility];  Service: General;  Laterality: N/A;  . LUNG REMOVAL, PARTIAL Right 2004   was fungal and not cancerous  . PROSTATE SURGERY    . UMBILICAL HERNIA REPAIR N/A 02/25/2016   Procedure: LAPAROSCOPIC UMBILICAL HERNIA REPAIR;  Surgeon: Rolm Bookbinder, MD;  Location: [facility];  Service: General;  Laterality: N/A;   Social History   Occupational History    Employer: RETIRED    Comment: Engineer, structural  Tobacco Use  . Smoking status: Former Smoker    Packs/day: 1.00     Quit date: 06/16/1985    Years since quitting: 35.2  . Smokeless tobacco: Never Used  . Tobacco comment: quit approx age 62-   Vaping Use  . Vaping Use: Never used  Substance and Sexual Activity  . Alcohol use: No  . Drug use: No  . Sexual activity: Yes

## 2020-09-18 ENCOUNTER — Ambulatory Visit (INDEPENDENT_AMBULATORY_CARE_PROVIDER_SITE_OTHER): Payer: PPO

## 2020-09-18 ENCOUNTER — Other Ambulatory Visit: Payer: Self-pay

## 2020-09-18 DIAGNOSIS — D6851 Activated protein C resistance: Secondary | ICD-10-CM | POA: Diagnosis not present

## 2020-09-18 DIAGNOSIS — I4819 Other persistent atrial fibrillation: Secondary | ICD-10-CM | POA: Diagnosis not present

## 2020-09-18 DIAGNOSIS — Z5181 Encounter for therapeutic drug level monitoring: Secondary | ICD-10-CM

## 2020-09-18 LAB — POCT INR: INR: 1.2 — AB (ref 2.0–3.0)

## 2020-09-18 NOTE — Patient Instructions (Signed)
Description   Take 1.5 tablets today and tomorrow, then resume same dosage 1 tablet daily except for 1.5 tablets on Mondays and Fridays. Recheck INR in 1 week.  Cou[facility]in Clinic [phone]

## 2020-09-25 ENCOUNTER — Other Ambulatory Visit: Payer: Self-pay

## 2020-09-25 ENCOUNTER — Encounter: Payer: Self-pay | Admitting: Family

## 2020-09-25 ENCOUNTER — Ambulatory Visit (INDEPENDENT_AMBULATORY_CARE_PROVIDER_SITE_OTHER): Payer: PPO | Admitting: Family

## 2020-09-25 ENCOUNTER — Ambulatory Visit (INDEPENDENT_AMBULATORY_CARE_PROVIDER_SITE_OTHER): Payer: PPO | Admitting: Pharmacist

## 2020-09-25 ENCOUNTER — Ambulatory Visit (INDEPENDENT_AMBULATORY_CARE_PROVIDER_SITE_OTHER): Payer: PPO

## 2020-09-25 DIAGNOSIS — M79671 Pain in right foot: Secondary | ICD-10-CM | POA: Diagnosis not present

## 2020-09-25 DIAGNOSIS — D6851 Activated protein C resistance: Secondary | ICD-10-CM | POA: Diagnosis not present

## 2020-09-25 DIAGNOSIS — I4819 Other persistent atrial fibrillation: Secondary | ICD-10-CM | POA: Diagnosis not present

## 2020-09-25 DIAGNOSIS — Z5181 Encounter for therapeutic drug level monitoring: Secondary | ICD-10-CM | POA: Diagnosis not present

## 2020-09-25 LAB — POCT INR: INR: 1.9 — AB (ref 2.0–3.0)

## 2020-09-25 NOTE — Patient Instructions (Signed)
Description   Take 1.5 tablets today then continue taking 1 tablet daily except for 1.5 tablets on Mondays and Fridays. Recheck INR in 3 weeks.  Coumadin Clinic [phone]

## 2020-09-25 NOTE — Progress Notes (Signed)
Office Visit Note   Patient: Stanley Wright           Date of Birth: 12/16/1945           MRN: [id number] Visit Date: 09/25/2020              Requested by: Maude Leriche, PA-C [place],  [place] [address] PCP: Maude Leriche, PA-C  Chief Complaint  Patient presents with  . Right Foot - Follow-up    5th MT fx       HPI: The patient is a 75 year old gentleman who presents today in follow-up for fifth metatarsal fracture on the right.  Today he is in a Cam walker using a wheelchair for mobility.  Has had improvement in his pain which is only over the lateral column.  States he can ambulate in the boot without pain. Has even been trying to walk in a croc with some pain. But states is tolerable.   He is status post right talonavicular fusion March 10 of last year which he states was slow to heal and did eventually have the pain subside.  Assessment & Plan: Visit Diagnoses:  1. Pain in right foot     Plan: discussed case with Dr. Sharol Given. He will advance to a stiff soled walking shoe. No surgical intervention. Follow up in 2 months for clinical re eval.  Follow-Up Instructions: No follow-ups on file.   Ortho Exam  Patient is alert, oriented, no adenopathy, well-dressed, normal affect, normal respiratory effort. On examination of the right foot and ankle there is stable, baseline edema.  He is tender along the base of the fifth metatarsal.  There is no ecchymosis.  Palpable dorsalis pedis pulse.  Imaging: XR Foot 2 Views Right  Result Date: 09/25/2020 Radiographs of right foot show base of fifth metatarsal fracture without interval callus formation. No new finding.  No images are attached to the encounter.  Labs: Lab Results  Component Value Date   HGBA1C 6.3 11/21/2015   HGBA1C 6.2 06/12/2015   REPTSTATUS 12/24/2017 FINAL 12/23/2017   CULT  12/23/2017    NO GROWTH Performed at [facility], [place] [name] [place], [place] [address]     LABORGA ESCHERICHIA COLI 12/22/2017     Lab Results  Component Value Date   ALBUMIN 4.1 01/17/2019   ALBUMIN 2.9 (L) 12/26/2017   ALBUMIN 3.0 (L) 12/25/2017    Lab Results  Component Value Date   MG 1.9 12/25/2017   No results found for: VD25OH  No results found for: PREALBUMIN CBC EXTENDED Latest Ref Rng & Units 08/24/2019 12/26/2017 12/25/2017  WBC 4.0 - 10.5 K/uL 4.7 4.7 4.8  RBC 4.22 - 5.81 MIL/uL 4.36 4.22 4.47  HGB 13.0 - 17.0 g/dL 14.1 13.6 14.3  HCT 39.0 - 52.0 % 43.7 40.8 43.3  PLT 150 - 400 K/uL 169 118(L) 107(L)  NEUTROABS 1.7 - 7.7 K/uL - - -  LYMPHSABS 0.7 - 4.0 K/uL - - -     There is no height or weight on file to calculate BMI.  Orders:  Orders Placed This Encounter  Procedures  . XR Foot 2 Views Right   No orders of the defined types were placed in this encounter.    Procedures: No procedures performed  Clinical Data: No additional findings.  ROS:  All other systems negative, except as noted in the HPI. Review of Systems  Constitutional: Negative for chills and fever.  Musculoskeletal: Positive for arthralgias  and myalgias.    Objective: Vital Signs: There were no vitals taken for this visit.  Specialty Comments:  No specialty comments available.  PMFS History: Patient Active Problem List   Diagnosis Date Noted  . Primary osteoarthritis, right ankle and foot   . Lower extremity edema 08/10/2019  . Acute DVT (deep venous thrombosis) ([place]) 07/19/2019  . History of pulmonary embolism 07/19/2019  . Parkinson's disease ([place]) 02/03/2018  . Community acquired pneumonia of left lower lobe of lung 12/23/2017  . Sepsis ([place]) 12/23/2017  . Morbid obesity due to excess calories ([place]) 08/23/2016  . OSA on CPAP 08/23/2016  . Dyspnea on exertion 08/22/2016  . Umbilical hernia 32/20/2542  . Persistent atrial fibrillation ([place])   . Encounter for therapeutic drug monitoring 08/12/2013  . Long term current use of anticoagulant 07/30/2010  .  COLONIC POLYPS 06/03/2010  . Factor V Leiden ([place]) 06/03/2010  . GERD 06/03/2010  . HIATAL HERNIA 06/03/2010  . BENIGN PROSTATIC HYPERTROPHY, WITH OBSTRUCTION 06/03/2010  . PSORIASIS 06/03/2010   Past Medical History:  Diagnosis Date  . Arthritis   . BENIGN PROSTATIC HYPERTROPHY, WITH OBSTRUCTION   . Cancer (HCC)    skin, basal, squamous  . COLONIC POLYPS   . Diverticulitis   . DVT (deep venous thrombosis) ([place]) 2000  . Dysrhythmia    Hx Afib- 2017  . Early cataract   . Factor 5 Leiden mutation, heterozygous ([place])   . GERD (gastroesophageal reflux disease)    not on medication  . Hearing aid worn    B/L  . HIATAL HERNIA   . ITP (idiopathic thrombocytopenic purpura) 2003  . Long term current use of anticoagulant   . Osteoarthritis    right foot  . Parkinson's disease ([place]) 02/03/2018  . PSORIASIS   . Pulmonary embolus ([place]) 2000  . Shortness of breath dyspnea    at times - Talking alot  . Sleep apnea   . Wears glasses     Family History  Problem Relation Age of Onset  . Cancer Father   . Breast cancer Sister   . Heart disease Brother   . Cancer Maternal Grandmother   . Stroke Paternal Grandfather     Past Surgical History:  Procedure Laterality Date  . ANKLE FUSION Right 08/24/2019   Procedure: RIGHT TALO-NAVICULAR FUSION;  Surgeon: Newt Minion, MD;  Location: [facility];  Service: Orthopedics;  Laterality: Right;  . CARDIOVERSION N/A 11/22/2015   Procedure: CARDIOVERSION;  Surgeon: Sanda Klein, MD;  [facility];  Service: Cardiovascular;  Laterality: N/A;  . COLONOSCOPY    . HERNIA REPAIR Left    Inguinal- x2 . mesh x1  . INSERTION OF MESH N/A 02/25/2016   Procedure: INSERTION OF MESH;  Surgeon: Rolm Bookbinder, MD;  Location: [facility];  Service: General;  Laterality: N/A;  . LUNG REMOVAL, PARTIAL Right 2004   was fungal and not cancerous  . PROSTATE SURGERY    . UMBILICAL HERNIA REPAIR N/A 02/25/2016   Procedure: LAPAROSCOPIC UMBILICAL HERNIA REPAIR;   Surgeon: Rolm Bookbinder, MD;  Location: [facility];  Service: General;  Laterality: N/A;   Social History   Occupational History    Employer: RETIRED    Comment: Engineer, structural  Tobacco Use  . Smoking status: Former Smoker    Packs/day: 1.00    Quit date: 06/16/1985    Years since quitting: 35.3  . Smokeless tobacco: Never Used  . Tobacco comment: quit approx age 45-   Vaping Use  . Vaping  Use: Never used  Substance and Sexual Activity  . Alcohol use: No  . Drug use: No  . Sexual activity: Yes

## 2020-10-05 ENCOUNTER — Other Ambulatory Visit: Payer: Self-pay | Admitting: Neurology

## 2020-10-17 DIAGNOSIS — M542 Cervicalgia: Secondary | ICD-10-CM | POA: Diagnosis not present

## 2020-10-17 DIAGNOSIS — G44209 Tension-type headache, unspecified, not intractable: Secondary | ICD-10-CM | POA: Diagnosis not present

## 2020-10-17 DIAGNOSIS — M9901 Segmental and somatic dysfunction of cervical region: Secondary | ICD-10-CM | POA: Diagnosis not present

## 2020-10-17 DIAGNOSIS — M47812 Spondylosis without myelopathy or radiculopathy, cervical region: Secondary | ICD-10-CM | POA: Diagnosis not present

## 2020-10-18 ENCOUNTER — Ambulatory Visit: Payer: PPO | Admitting: Physical Therapy

## 2020-10-19 ENCOUNTER — Ambulatory Visit (INDEPENDENT_AMBULATORY_CARE_PROVIDER_SITE_OTHER): Payer: PPO

## 2020-10-19 ENCOUNTER — Other Ambulatory Visit: Payer: Self-pay

## 2020-10-19 DIAGNOSIS — Z5181 Encounter for therapeutic drug level monitoring: Secondary | ICD-10-CM | POA: Diagnosis not present

## 2020-10-19 DIAGNOSIS — D6851 Activated protein C resistance: Secondary | ICD-10-CM

## 2020-10-19 DIAGNOSIS — I4819 Other persistent atrial fibrillation: Secondary | ICD-10-CM

## 2020-10-19 LAB — POCT INR: INR: 3.6 — AB (ref 2.0–3.0)

## 2020-10-19 NOTE — Patient Instructions (Signed)
Description   Skip today's dosage of Warfarin, then resume same dosage 1 tablet daily except for 1.5 tablets on Mondays and Fridays. Recheck INR in 2-3 weeks.  Coumadin Clinic [phone]

## 2020-10-27 NOTE — Progress Notes (Signed)
HPI: FUpermanentatrial fibrillation and history of factor V Leiden with 2 previous pulmonary emboli. He is on chronic Coumadin. Abdominal CT 2011 showed no aneurysm. Found to be in atrial fibrillation onpreviouselectrocardiogram. TSH 2.88. Patient placed on Cardizem for rate control. Had successful DCCV 11/22/15.However atrial fibrillation recurred. Nuclear study 2/18 showed no no ischemia or infarction.Monitor November 2021 showed atrial fibrillation with PVCs or aberrantly conducted beats rate mildly elevated.  Since last seen,patient denies dyspnea, chest pain, palpitations or syncope.  Chronic pedal edema.  No bleeding.  Current Outpatient Medications  Medication Sig Dispense Refill  . carbidopa-levodopa (SINEMET CR) 50-200 MG tablet TAKE 1 TABLET BY MOUTH AT BEDTIME 90 tablet 0  . carbidopa-levodopa (SINEMET IR) 25-100 MG tablet 1 tablet 6 times per day 540 tablet 1  . clonazePAM (KLONOPIN) 0.5 MG tablet Take 0.5 tablets (0.25 mg total) by mouth at bedtime. 45 tablet 1  . diltiazem (CARDIZEM CD) 360 MG 24 hr capsule Take 1 capsule (360 mg total) by mouth daily. 90 capsule 3  . furosemide (LASIX) 40 MG tablet Take 1 tablet (40 mg total) by mouth daily. 90 tablet 3  . hydroxychloroquine (PLAQUENIL) 200 MG tablet Take 200 mg by mouth once a week.    . warfarin (COUMADIN) 5 MG tablet TAKE 1-1&1/2  TABLETS BY MOUTH ONCE DAILY OR AS DIRECTED BY COUMADIN CLINIC 120 tablet 0   No current facility-administered medications for this visit.     Past Medical History:  Diagnosis Date  . Arthritis   . BENIGN PROSTATIC HYPERTROPHY, WITH OBSTRUCTION   . Cancer (HCC)    skin, basal, squamous  . COLONIC POLYPS   . Diverticulitis   . DVT (deep venous thrombosis) ([place]) 2000  . Dysrhythmia    Hx Afib- 2017  . Early cataract   . Factor 5 Leiden mutation, heterozygous ([place])   . GERD (gastroesophageal reflux disease)    not on medication  . Hearing aid worn    B/L  . HIATAL HERNIA   .  ITP (idiopathic thrombocytopenic purpura) 2003  . Long term current use of anticoagulant   . Osteoarthritis    right foot  . Parkinson's disease ([place]) 02/03/2018  . PSORIASIS   . Pulmonary embolus ([place]) 2000  . Shortness of breath dyspnea    at times - Talking alot  . Sleep apnea   . Wears glasses     Past Surgical History:  Procedure Laterality Date  . ANKLE FUSION Right 08/24/2019   Procedure: RIGHT TALO-NAVICULAR FUSION;  Surgeon: Newt Minion, MD;  Location: [facility];  Service: Orthopedics;  Laterality: Right;  . CARDIOVERSION N/A 11/22/2015   Procedure: CARDIOVERSION;  Surgeon: Sanda Klein, MD;  [facility];  Service: Cardiovascular;  Laterality: N/A;  . COLONOSCOPY    . HERNIA REPAIR Left    Inguinal- x2 . mesh x1  . INSERTION OF MESH N/A 02/25/2016   Procedure: INSERTION OF MESH;  Surgeon: Rolm Bookbinder, MD;  Location: [facility];  Service: General;  Laterality: N/A;  . LUNG REMOVAL, PARTIAL Right 2004   was fungal and not cancerous  . PROSTATE SURGERY    . UMBILICAL HERNIA REPAIR N/A 02/25/2016   Procedure: LAPAROSCOPIC UMBILICAL HERNIA REPAIR;  Surgeon: Rolm Bookbinder, MD;  Location: [facility];  Service: General;  Laterality: N/A;    Social History   Socioeconomic History  . Marital status: Divorced    Spouse name: Not on file  . Number of children: 2  . Years of  education: Masters  . Highest education level: Not on file  Occupational History    Employer: RETIRED    Comment: police officer  Tobacco Use  . Smoking status: Former Smoker    Packs/day: 1.00    Quit date: 06/16/1985    Years since quitting: 35.4  . Smokeless tobacco: Never Used  . Tobacco comment: quit approx age 10-   Vaping Use  . Vaping Use: Never used  Substance and Sexual Activity  . Alcohol use: No  . Drug use: No  . Sexual activity: Yes  Other Topics Concern  . Not on file  Social History Narrative   Lives alone   Caffeine use: Coffee daily   Right handed    Social  Determinants of Health   Financial Resource Strain: Not on file  Food Insecurity: Not on file  Transportation Needs: Not on file  Physical Activity: Not on file  Stress: Not on file  Social Connections: Not on file  Intimate Partner Violence: Not on file    Family History  Problem Relation Age of Onset  . Cancer Father   . Breast cancer Sister   . Heart disease Brother   . Cancer Maternal Grandmother   . Stroke Paternal Grandfather     ROS: no fevers or chills, productive cough, hemoptysis, dysphasia, odynophagia, melena, hematochezia, dysuria, hematuria, rash, seizure activity, orthopnea, PND, pedal edema, claudication. Remaining systems are negative.  Physical Exam: Well-developed well-nourished in no acute distress.  Skin is warm and dry.  HEENT is normal.  Neck is supple.  Chest is clear to auscultation with normal expansion.  Cardiovascular exam is irregular Abdominal exam nontender or distended. No masses palpated. Extremities show 1+ edema. neuro Parkinson's   A/P  1 permanent atrial fibrillation-we will continue Cardizem for rate control.  Continue Coumadin with goal INR 2-3.  Check hemoglobin.   2 factor V Leiden-patient has a history of recurrent pulmonary emboli and DVT.  Continue Coumadin.  3 chronic pedal edema-continue Lasix.  Check potassium and renal function.  4 history of Parkinson's. Note patient does fall occasionally.  However I am hesitant to discontinue anticoagulation given atrial fibrillation but also history of recurrent pulmonary emboli and DVT.  Kirk Ruths, MD

## 2020-11-06 ENCOUNTER — Encounter: Payer: Self-pay | Admitting: Cardiology

## 2020-11-06 ENCOUNTER — Ambulatory Visit: Payer: PPO | Admitting: Cardiology

## 2020-11-06 ENCOUNTER — Ambulatory Visit (INDEPENDENT_AMBULATORY_CARE_PROVIDER_SITE_OTHER): Payer: PPO | Admitting: Pharmacist Clinician (PhC)/ Clinical Pharmacy Specialist

## 2020-11-06 ENCOUNTER — Other Ambulatory Visit: Payer: Self-pay

## 2020-11-06 VITALS — BP 140/64 | HR 62 | Ht 72.0 in | Wt 258.2 lb

## 2020-11-06 DIAGNOSIS — R600 Localized edema: Secondary | ICD-10-CM | POA: Diagnosis not present

## 2020-11-06 DIAGNOSIS — Z5181 Encounter for therapeutic drug level monitoring: Secondary | ICD-10-CM | POA: Diagnosis not present

## 2020-11-06 DIAGNOSIS — I4819 Other persistent atrial fibrillation: Secondary | ICD-10-CM

## 2020-11-06 DIAGNOSIS — Z7901 Long term (current) use of anticoagulants: Secondary | ICD-10-CM | POA: Diagnosis not present

## 2020-11-06 DIAGNOSIS — D6851 Activated protein C resistance: Secondary | ICD-10-CM | POA: Diagnosis not present

## 2020-11-06 DIAGNOSIS — I4821 Permanent atrial fibrillation: Secondary | ICD-10-CM

## 2020-11-06 DIAGNOSIS — I82411 Acute embolism and thrombosis of right femoral vein: Secondary | ICD-10-CM

## 2020-11-06 DIAGNOSIS — Z86711 Personal history of pulmonary embolism: Secondary | ICD-10-CM

## 2020-11-06 LAB — CBC
Hematocrit: 45 % (ref 37.5–51.0)
Hemoglobin: 14.9 g/dL (ref 13.0–17.7)
MCH: 32.3 pg (ref 26.6–33.0)
MCHC: 33.1 g/dL (ref 31.5–35.7)
MCV: 97 fL (ref 79–97)
Platelets: 168 10*3/uL (ref 150–450)
RBC: 4.62 x10E6/uL (ref 4.14–5.80)
RDW: 12.7 % (ref 11.6–15.4)
WBC: 4.3 10*3/uL (ref 3.4–10.8)

## 2020-11-06 LAB — BASIC METABOLIC PANEL
BUN/Creatinine Ratio: 21 (ref 10–24)
BUN: 22 mg/dL (ref 8–27)
CO2: 24 mmol/L (ref 20–29)
Calcium: 9 mg/dL (ref 8.6–10.2)
Chloride: 101 mmol/L (ref 96–106)
Creatinine, Ser: 1.06 mg/dL (ref 0.76–1.27)
Glucose: 90 mg/dL (ref 65–99)
Potassium: 4 mmol/L (ref 3.5–5.2)
Sodium: 141 mmol/L (ref 134–144)
eGFR: 73 mL/min/{1.73_m2} (ref >59)

## 2020-11-06 LAB — POCT INR: INR: 1.2 — AB (ref 2.0–3.0)

## 2020-11-06 NOTE — Patient Instructions (Signed)

## 2020-11-07 ENCOUNTER — Other Ambulatory Visit: Payer: Self-pay | Admitting: Neurology

## 2020-11-07 DIAGNOSIS — M47812 Spondylosis without myelopathy or radiculopathy, cervical region: Secondary | ICD-10-CM | POA: Diagnosis not present

## 2020-11-07 DIAGNOSIS — M542 Cervicalgia: Secondary | ICD-10-CM | POA: Diagnosis not present

## 2020-11-07 DIAGNOSIS — G44209 Tension-type headache, unspecified, not intractable: Secondary | ICD-10-CM | POA: Diagnosis not present

## 2020-11-07 DIAGNOSIS — M9901 Segmental and somatic dysfunction of cervical region: Secondary | ICD-10-CM | POA: Diagnosis not present

## 2020-11-13 ENCOUNTER — Encounter: Payer: Self-pay | Admitting: *Deleted

## 2020-11-14 DIAGNOSIS — H35033 Hypertensive retinopathy, bilateral: Secondary | ICD-10-CM | POA: Diagnosis not present

## 2020-11-14 DIAGNOSIS — M47812 Spondylosis without myelopathy or radiculopathy, cervical region: Secondary | ICD-10-CM | POA: Diagnosis not present

## 2020-11-14 DIAGNOSIS — G44209 Tension-type headache, unspecified, not intractable: Secondary | ICD-10-CM | POA: Diagnosis not present

## 2020-11-14 DIAGNOSIS — G20 Parkinson's disease: Secondary | ICD-10-CM | POA: Diagnosis not present

## 2020-11-14 DIAGNOSIS — M9901 Segmental and somatic dysfunction of cervical region: Secondary | ICD-10-CM | POA: Diagnosis not present

## 2020-11-14 DIAGNOSIS — I5032 Chronic diastolic (congestive) heart failure: Secondary | ICD-10-CM | POA: Diagnosis not present

## 2020-11-14 DIAGNOSIS — M542 Cervicalgia: Secondary | ICD-10-CM | POA: Diagnosis not present

## 2020-11-20 DIAGNOSIS — Z20822 Contact with and (suspected) exposure to covid-19: Secondary | ICD-10-CM | POA: Diagnosis not present

## 2020-11-29 ENCOUNTER — Ambulatory Visit (INDEPENDENT_AMBULATORY_CARE_PROVIDER_SITE_OTHER): Payer: PPO | Admitting: Pharmacist

## 2020-11-29 ENCOUNTER — Other Ambulatory Visit: Payer: Self-pay

## 2020-11-29 DIAGNOSIS — Z5181 Encounter for therapeutic drug level monitoring: Secondary | ICD-10-CM | POA: Diagnosis not present

## 2020-11-29 DIAGNOSIS — D6851 Activated protein C resistance: Secondary | ICD-10-CM | POA: Diagnosis not present

## 2020-11-29 DIAGNOSIS — I4819 Other persistent atrial fibrillation: Secondary | ICD-10-CM

## 2020-11-29 LAB — POCT INR: INR: 2.5 (ref 2.0–3.0)

## 2020-11-29 NOTE — Patient Instructions (Signed)
Description   Continue same dosage 1 tablet daily except for 1.5 tablets on Mondays and Fridays. Recheck INR in 2 weeks.  Cou[facility]in Clinic [phone]

## 2020-12-05 DIAGNOSIS — M542 Cervicalgia: Secondary | ICD-10-CM | POA: Diagnosis not present

## 2020-12-05 DIAGNOSIS — G44209 Tension-type headache, unspecified, not intractable: Secondary | ICD-10-CM | POA: Diagnosis not present

## 2020-12-05 DIAGNOSIS — M9901 Segmental and somatic dysfunction of cervical region: Secondary | ICD-10-CM | POA: Diagnosis not present

## 2020-12-05 DIAGNOSIS — M47812 Spondylosis without myelopathy or radiculopathy, cervical region: Secondary | ICD-10-CM | POA: Diagnosis not present

## 2020-12-12 DIAGNOSIS — G44209 Tension-type headache, unspecified, not intractable: Secondary | ICD-10-CM | POA: Diagnosis not present

## 2020-12-12 DIAGNOSIS — M47812 Spondylosis without myelopathy or radiculopathy, cervical region: Secondary | ICD-10-CM | POA: Diagnosis not present

## 2020-12-12 DIAGNOSIS — M9901 Segmental and somatic dysfunction of cervical region: Secondary | ICD-10-CM | POA: Diagnosis not present

## 2020-12-12 DIAGNOSIS — M542 Cervicalgia: Secondary | ICD-10-CM | POA: Diagnosis not present

## 2020-12-19 DIAGNOSIS — M542 Cervicalgia: Secondary | ICD-10-CM | POA: Diagnosis not present

## 2020-12-19 DIAGNOSIS — M47812 Spondylosis without myelopathy or radiculopathy, cervical region: Secondary | ICD-10-CM | POA: Diagnosis not present

## 2020-12-19 DIAGNOSIS — M9901 Segmental and somatic dysfunction of cervical region: Secondary | ICD-10-CM | POA: Diagnosis not present

## 2020-12-19 DIAGNOSIS — G44209 Tension-type headache, unspecified, not intractable: Secondary | ICD-10-CM | POA: Diagnosis not present

## 2020-12-20 ENCOUNTER — Ambulatory Visit (INDEPENDENT_AMBULATORY_CARE_PROVIDER_SITE_OTHER): Payer: PPO | Admitting: Pharmacist

## 2020-12-20 ENCOUNTER — Other Ambulatory Visit: Payer: Self-pay

## 2020-12-20 DIAGNOSIS — D6851 Activated protein C resistance: Secondary | ICD-10-CM | POA: Diagnosis not present

## 2020-12-20 DIAGNOSIS — I4819 Other persistent atrial fibrillation: Secondary | ICD-10-CM

## 2020-12-20 DIAGNOSIS — Z5181 Encounter for therapeutic drug level monitoring: Secondary | ICD-10-CM | POA: Diagnosis not present

## 2020-12-20 LAB — POCT INR: INR: 1.6 — AB (ref 2.0–3.0)

## 2020-12-20 NOTE — Patient Instructions (Signed)
Description   Take 1.5 tablets today and 2 tablets tomorrow, then continue same dosage 1 tablet daily except for 1.5 tablets on Mondays and Fridays. Recheck INR in 2 weeks.  [facility] Clinic [phone]

## 2020-12-25 ENCOUNTER — Other Ambulatory Visit: Payer: Self-pay | Admitting: Neurology

## 2021-01-02 DIAGNOSIS — G44209 Tension-type headache, unspecified, not intractable: Secondary | ICD-10-CM | POA: Diagnosis not present

## 2021-01-02 DIAGNOSIS — M47812 Spondylosis without myelopathy or radiculopathy, cervical region: Secondary | ICD-10-CM | POA: Diagnosis not present

## 2021-01-02 DIAGNOSIS — M9901 Segmental and somatic dysfunction of cervical region: Secondary | ICD-10-CM | POA: Diagnosis not present

## 2021-01-02 DIAGNOSIS — M542 Cervicalgia: Secondary | ICD-10-CM | POA: Diagnosis not present

## 2021-01-03 ENCOUNTER — Other Ambulatory Visit: Payer: Self-pay

## 2021-01-03 ENCOUNTER — Ambulatory Visit (INDEPENDENT_AMBULATORY_CARE_PROVIDER_SITE_OTHER): Payer: PPO

## 2021-01-03 DIAGNOSIS — I4819 Other persistent atrial fibrillation: Secondary | ICD-10-CM

## 2021-01-03 DIAGNOSIS — Z5181 Encounter for therapeutic drug level monitoring: Secondary | ICD-10-CM | POA: Diagnosis not present

## 2021-01-03 DIAGNOSIS — D6851 Activated protein C resistance: Secondary | ICD-10-CM | POA: Diagnosis not present

## 2021-01-03 LAB — POCT INR: INR: 2.5 (ref 2.0–3.0)

## 2021-01-03 NOTE — Patient Instructions (Signed)
Description   Continue same dosage 1 tablet daily except for 1.5 tablets on Mondays and Fridays. Recheck INR in 3 weeks.  C[facility]in Clinic [phone]

## 2021-01-08 ENCOUNTER — Other Ambulatory Visit: Payer: Self-pay | Admitting: Neurology

## 2021-01-09 DIAGNOSIS — G44209 Tension-type headache, unspecified, not intractable: Secondary | ICD-10-CM | POA: Diagnosis not present

## 2021-01-09 DIAGNOSIS — M9901 Segmental and somatic dysfunction of cervical region: Secondary | ICD-10-CM | POA: Diagnosis not present

## 2021-01-09 DIAGNOSIS — M47812 Spondylosis without myelopathy or radiculopathy, cervical region: Secondary | ICD-10-CM | POA: Diagnosis not present

## 2021-01-09 DIAGNOSIS — M542 Cervicalgia: Secondary | ICD-10-CM | POA: Diagnosis not present

## 2021-01-14 ENCOUNTER — Other Ambulatory Visit: Payer: Self-pay

## 2021-01-14 MED ORDER — CARBIDOPA-LEVODOPA 25-100 MG PO TABS: ORAL_TABLET | ORAL | 0 refills | Status: DC

## 2021-01-14 NOTE — Telephone Encounter (Signed)
Called patient to clarify what he was needing in regards to the Mychart message we received. Patient stated that he is needing a refill of his carbidopa levodopa 25/100 and is wanting a 90 days supply because it is cheaper for him.

## 2021-01-16 DIAGNOSIS — I482 Chronic atrial fibrillation, unspecified: Secondary | ICD-10-CM | POA: Diagnosis not present

## 2021-01-16 DIAGNOSIS — G20 Parkinson's disease: Secondary | ICD-10-CM | POA: Diagnosis not present

## 2021-01-16 DIAGNOSIS — M47812 Spondylosis without myelopathy or radiculopathy, cervical region: Secondary | ICD-10-CM | POA: Diagnosis not present

## 2021-01-16 DIAGNOSIS — Z7901 Long term (current) use of anticoagulants: Secondary | ICD-10-CM | POA: Diagnosis not present

## 2021-01-16 DIAGNOSIS — M9901 Segmental and somatic dysfunction of cervical region: Secondary | ICD-10-CM | POA: Diagnosis not present

## 2021-01-16 DIAGNOSIS — R296 Repeated falls: Secondary | ICD-10-CM | POA: Diagnosis not present

## 2021-01-16 DIAGNOSIS — G44209 Tension-type headache, unspecified, not intractable: Secondary | ICD-10-CM | POA: Diagnosis not present

## 2021-01-16 DIAGNOSIS — M542 Cervicalgia: Secondary | ICD-10-CM | POA: Diagnosis not present

## 2021-01-18 ENCOUNTER — Other Ambulatory Visit: Payer: Self-pay | Admitting: Cardiology

## 2021-01-22 ENCOUNTER — Other Ambulatory Visit: Payer: Self-pay | Admitting: Neurology

## 2021-01-23 DIAGNOSIS — M47812 Spondylosis without myelopathy or radiculopathy, cervical region: Secondary | ICD-10-CM | POA: Diagnosis not present

## 2021-01-23 DIAGNOSIS — M9901 Segmental and somatic dysfunction of cervical region: Secondary | ICD-10-CM | POA: Diagnosis not present

## 2021-01-23 DIAGNOSIS — G44209 Tension-type headache, unspecified, not intractable: Secondary | ICD-10-CM | POA: Diagnosis not present

## 2021-01-23 DIAGNOSIS — M542 Cervicalgia: Secondary | ICD-10-CM | POA: Diagnosis not present

## 2021-01-24 ENCOUNTER — Ambulatory Visit (INDEPENDENT_AMBULATORY_CARE_PROVIDER_SITE_OTHER): Payer: PPO | Admitting: *Deleted

## 2021-01-24 ENCOUNTER — Other Ambulatory Visit: Payer: Self-pay

## 2021-01-24 DIAGNOSIS — Z5181 Encounter for therapeutic drug level monitoring: Secondary | ICD-10-CM | POA: Diagnosis not present

## 2021-01-24 DIAGNOSIS — I4819 Other persistent atrial fibrillation: Secondary | ICD-10-CM

## 2021-01-24 LAB — POCT INR: INR: 2.1 (ref 2.0–3.0)

## 2021-01-24 NOTE — Patient Instructions (Signed)
Description   Continue same dosage 1 tablet daily except for 1.5 tablets on Mondays and Fridays. Recheck INR in 4 weeks.  Coumadin Clinic [phone]

## 2021-01-30 DIAGNOSIS — M47812 Spondylosis without myelopathy or radiculopathy, cervical region: Secondary | ICD-10-CM | POA: Diagnosis not present

## 2021-01-30 DIAGNOSIS — M542 Cervicalgia: Secondary | ICD-10-CM | POA: Diagnosis not present

## 2021-01-30 DIAGNOSIS — G44209 Tension-type headache, unspecified, not intractable: Secondary | ICD-10-CM | POA: Diagnosis not present

## 2021-01-30 DIAGNOSIS — M9901 Segmental and somatic dysfunction of cervical region: Secondary | ICD-10-CM | POA: Diagnosis not present

## 2021-02-04 NOTE — Progress Notes (Signed)
Assessment/Plan:   1.   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               -Take carbidopa/levodopa 25/100, 1 tablet 6 times per day.  He had gone up and has now gone down on the pils to 4-5 per day but has wearing off.  Told him to take 6 per day at 2.5 hour intervals.  He thinks its very helpful  -Continue carbidopa/levodopa 50/200 at bed             -The patient had a DaTscan in February, 2020 and there was near absent dopamine in the bilateral putamen.    -May do on/off test in the future but since he thinks levodopa working (didn't take it today though), we decided to hold off.  -referrral to PT  -walker at all times - has many falls but none with the walker.   2.  Cervical dystonia             -Patient wants to hold on Botox.   3.       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  -had surgery on eyes and it didn't help   4.  dysphagia             -feels that swallow is okay right now   5.  Atrial fibrillation             -Following with cardiology.  On diltiazem.   6.  Factor V Leiden             -On Coumadin.  7.  RBD  -Clonazepam, 0.5 mg, half tablet at bedtime.  The addition was helpful  -PDMP is reviewed.  No red flags.   Subjective:   Stanley Wright was seen today in follow up for Parkinsons disease.  My previous records were reviewed prior to todays visit as well as outside records available to me.  Pt denies lightheadedness, near syncope.  No hallucinations.  Mood has been good.  Last visit, the patient was taking extra levodopa in the middle of the night to help him sleep.  I told him not to do that.  I did cautiously start low-dose clonazepam, for sleep and for RBD.  It is working well.  Notes freezing.  Has had at least 1 fall per month - falls to side.  Last fall 2 days ago - feet got caught in turn.  Didn't have Ustep.  No falls have ever occurred  with the walker.  Admits to not using walker much.  Pt does think that levodopa is helping.  Current prescribed movement disorder medications: Carbidopa/levodopa 25/100, 1 tablet 6 times per day (has only been taking 4-5 in day) Carbidopa/levodopa to 50/200 at bedtime (out of it for the last week so has been using the 25/100 and he is running out of that too) Clonazepam 0.5 mg, half tablet at bedtime   PREVIOUS MEDICATIONS: Sinemet and Sinemet CR; Inbrija (patient had trouble using it)  ALLERGIES:   Allergies  Allergen Reactions   Tylenol [Acetaminophen] Other (See Comments)    Pt states he had a DNA test completed that stated he should never take tylenol.    CURRENT MEDICATIONS:  Outpatient Encounter Medications as of 02/06/2021  Medication  Sig   carbidopa-levodopa (SINEMET CR) 50-200 MG tablet TAKE 1 TABLET BY MOUTH AT BEDTIME   carbidopa-levodopa (SINEMET IR) 25-100 MG tablet 1 po 5-6 times per day   clonazePAM (KLONOPIN) 0.5 MG tablet Take 0.5 tablets (0.25 mg total) by mouth at bedtime.   diltiazem (CARDIZEM CD) 360 MG 24 hr capsule Take 1 capsule (360 mg total) by mouth daily.   furosemide (LASIX) 40 MG tablet Take 1 tablet (40 mg total) by mouth daily.   hydroxychloroquine (PLAQUENIL) 200 MG tablet Take 200 mg by mouth once a week.   warfarin (COUMADIN) 5 MG tablet TAKE 1 TO 1 & 1/2 (ONE & ONE-HALF) TABLETS BY MOUTH ONCE DAILY OR AS DIRECTED BY COUMADIN CLINIC   No facility-administered encounter medications on file as of 02/06/2021.    Objective:   PHYSICAL EXAMINATION:    VITALS:   Vitals:   02/06/21 0805  BP: 120/84  Pulse: 81  SpO2: 95%  Weight: 255 lb (115.7 kg)  Height: 6' (1.829 m)     GEN:  The patient appears stated age and is in NAD. HEENT:  Normocephalic, atraumatic.  The mucous membranes are moist.  Neck/HEME:  There are no carotid bruits bilaterally.  Chin approximates the chest.  Neck is flexed.  R ear to R shoulder  Neurological  examination:  Orientation: The patient is alert and oriented x3. Cranial nerves: There is good facial symmetry with facial hypomimia.  He has complete upgaze paresis and mild downgaze paresis.  The speech is fluent and dysarthric. Soft palate rises symmetrically and there is no tongue deviation. Hearing is intact to conversational tone. Sensation: Sensation is intact to light touch throughout Motor: Strength is at least antigravity x4.  Pt has not taken medication today  Movement examination: Tone: There is nl tone in the UE Abnormal movements: none Coordination:  There is decremation with RAM's, with any form of RAMS, including alternating supination and pronation of the forearm, hand opening and closing, finger taps, heel taps and toe taps on the L>R Gait and Station:  L foot drop.  R freezes in the turn  I have reviewed and interpreted the following labs independently    Chemistry      Component Value Date/Time   NA 141 11/06/2020 0943   K 4.0 11/06/2020 0943   CL 101 11/06/2020 0943   CO2 24 11/06/2020 0943   BUN 22 11/06/2020 0943   CREATININE 1.06 11/06/2020 0943   CREATININE 0.88 06/12/2015 1505      Component Value Date/Time   CALCIUM 9.0 11/06/2020 0943   ALKPHOS 62 01/17/2019 1321   AST 12 01/17/2019 1321   ALT 6 01/17/2019 1321   BILITOT 0.4 01/17/2019 1321       Lab Results  Component Value Date   WBC 4.3 11/06/2020   HGB 14.9 11/06/2020   HCT 45.0 11/06/2020   MCV 97 11/06/2020   PLT 168 11/06/2020    Lab Results  Component Value Date   TSH 2.88 10/08/2015     Total time spent on today's visit was 30 minutes, including both face-to-face time and nonface-to-face time.  Time included that spent on review of records (prior notes available to me/labs/imaging if pertinent), discussing treatment and goals, answering patient's questions and coordinating care.  Cc:  Scifres, Dorothy, [employer]

## 2021-02-06 ENCOUNTER — Other Ambulatory Visit: Payer: Self-pay

## 2021-02-06 ENCOUNTER — Encounter: Payer: Self-pay | Admitting: Neurology

## 2021-02-06 ENCOUNTER — Ambulatory Visit: Payer: PPO | Admitting: Neurology

## 2021-02-06 VITALS — BP 120/84 | HR 81 | Ht 72.0 in | Wt 255.0 lb

## 2021-02-06 DIAGNOSIS — G232 Striatonigral degeneration: Secondary | ICD-10-CM

## 2021-02-06 DIAGNOSIS — G20 Parkinson's disease: Secondary | ICD-10-CM | POA: Diagnosis not present

## 2021-02-06 MED ORDER — CARBIDOPA-LEVODOPA 25-100 MG PO TABS: ORAL_TABLET | ORAL | 2 refills | Status: DC

## 2021-02-06 MED ORDER — CARBIDOPA-LEVODOPA ER 50-200 MG PO TBCR: 1.0000 | EXTENDED_RELEASE_TABLET | Freq: Every day | ORAL | 2 refills | Status: DC

## 2021-02-06 NOTE — Patient Instructions (Signed)
Take your carbidopa/levodopa 25/100 every 2.5-3 hours for a total of 6 per day  You have been referred to [facility] for therapy. They will call you directly to schedule an appointment.  Please call [phone] if you do not hear from them.

## 2021-02-13 DIAGNOSIS — M47812 Spondylosis without myelopathy or radiculopathy, cervical region: Secondary | ICD-10-CM | POA: Diagnosis not present

## 2021-02-13 DIAGNOSIS — M542 Cervicalgia: Secondary | ICD-10-CM | POA: Diagnosis not present

## 2021-02-13 DIAGNOSIS — G44209 Tension-type headache, unspecified, not intractable: Secondary | ICD-10-CM | POA: Diagnosis not present

## 2021-02-13 DIAGNOSIS — M9901 Segmental and somatic dysfunction of cervical region: Secondary | ICD-10-CM | POA: Diagnosis not present

## 2021-02-19 ENCOUNTER — Other Ambulatory Visit: Payer: Self-pay | Admitting: Neurology

## 2021-02-19 DIAGNOSIS — G20 Parkinson's disease: Secondary | ICD-10-CM

## 2021-02-20 DIAGNOSIS — G44209 Tension-type headache, unspecified, not intractable: Secondary | ICD-10-CM | POA: Diagnosis not present

## 2021-02-20 DIAGNOSIS — M47812 Spondylosis without myelopathy or radiculopathy, cervical region: Secondary | ICD-10-CM | POA: Diagnosis not present

## 2021-02-20 DIAGNOSIS — M9901 Segmental and somatic dysfunction of cervical region: Secondary | ICD-10-CM | POA: Diagnosis not present

## 2021-02-20 DIAGNOSIS — M542 Cervicalgia: Secondary | ICD-10-CM | POA: Diagnosis not present

## 2021-02-20 MED ORDER — CLONAZEPAM 0.5 MG PO TABS: ORAL_TABLET | ORAL | 1 refills | Status: DC

## 2021-02-21 ENCOUNTER — Ambulatory Visit (INDEPENDENT_AMBULATORY_CARE_PROVIDER_SITE_OTHER): Payer: PPO | Admitting: *Deleted

## 2021-02-21 ENCOUNTER — Other Ambulatory Visit: Payer: Self-pay

## 2021-02-21 DIAGNOSIS — D6851 Activated protein C resistance: Secondary | ICD-10-CM | POA: Diagnosis not present

## 2021-02-21 DIAGNOSIS — Z5181 Encounter for therapeutic drug level monitoring: Secondary | ICD-10-CM | POA: Diagnosis not present

## 2021-02-21 DIAGNOSIS — I4819 Other persistent atrial fibrillation: Secondary | ICD-10-CM

## 2021-02-21 LAB — POCT INR: INR: 1.5 — AB (ref 2.0–3.0)

## 2021-02-21 NOTE — Patient Instructions (Addendum)
Description   Today take 1.5 tablets and take 2 tablets tomorrow then continue same dosage 1 tablet daily except for 1.5 tablets on Mondays and Fridays. Recheck INR in 1 week.  [facility] [phone]

## 2021-02-22 DIAGNOSIS — H35033 Hypertensive retinopathy, bilateral: Secondary | ICD-10-CM | POA: Diagnosis not present

## 2021-02-22 DIAGNOSIS — G20 Parkinson's disease: Secondary | ICD-10-CM | POA: Diagnosis not present

## 2021-02-22 DIAGNOSIS — I5032 Chronic diastolic (congestive) heart failure: Secondary | ICD-10-CM | POA: Diagnosis not present

## 2021-02-27 DIAGNOSIS — M542 Cervicalgia: Secondary | ICD-10-CM | POA: Diagnosis not present

## 2021-02-27 DIAGNOSIS — M9901 Segmental and somatic dysfunction of cervical region: Secondary | ICD-10-CM | POA: Diagnosis not present

## 2021-02-27 DIAGNOSIS — G44209 Tension-type headache, unspecified, not intractable: Secondary | ICD-10-CM | POA: Diagnosis not present

## 2021-02-27 DIAGNOSIS — M47812 Spondylosis without myelopathy or radiculopathy, cervical region: Secondary | ICD-10-CM | POA: Diagnosis not present

## 2021-03-01 ENCOUNTER — Ambulatory Visit (INDEPENDENT_AMBULATORY_CARE_PROVIDER_SITE_OTHER): Payer: PPO

## 2021-03-01 ENCOUNTER — Other Ambulatory Visit: Payer: Self-pay

## 2021-03-01 DIAGNOSIS — Z5181 Encounter for therapeutic drug level monitoring: Secondary | ICD-10-CM | POA: Diagnosis not present

## 2021-03-01 DIAGNOSIS — I4819 Other persistent atrial fibrillation: Secondary | ICD-10-CM

## 2021-03-01 LAB — POCT INR: INR: 2.9 (ref 2.0–3.0)

## 2021-03-01 NOTE — Patient Instructions (Signed)
Description   Continue same dosage 1 tablet daily except for 1.5 tablets on Mondays and Fridays. Recheck INR in 3 weeks.  [facility] [phone]

## 2021-03-11 ENCOUNTER — Other Ambulatory Visit: Payer: Self-pay | Admitting: Cardiology

## 2021-03-11 DIAGNOSIS — I5032 Chronic diastolic (congestive) heart failure: Secondary | ICD-10-CM

## 2021-03-13 DIAGNOSIS — G44209 Tension-type headache, unspecified, not intractable: Secondary | ICD-10-CM | POA: Diagnosis not present

## 2021-03-13 DIAGNOSIS — M9901 Segmental and somatic dysfunction of cervical region: Secondary | ICD-10-CM | POA: Diagnosis not present

## 2021-03-13 DIAGNOSIS — M542 Cervicalgia: Secondary | ICD-10-CM | POA: Diagnosis not present

## 2021-03-13 DIAGNOSIS — M47812 Spondylosis without myelopathy or radiculopathy, cervical region: Secondary | ICD-10-CM | POA: Diagnosis not present

## 2021-03-14 DIAGNOSIS — D3132 Benign neoplasm of left choroid: Secondary | ICD-10-CM | POA: Diagnosis not present

## 2021-03-14 DIAGNOSIS — H35363 Drusen (degenerative) of macula, bilateral: Secondary | ICD-10-CM | POA: Diagnosis not present

## 2021-03-14 DIAGNOSIS — H2513 Age-related nuclear cataract, bilateral: Secondary | ICD-10-CM | POA: Diagnosis not present

## 2021-03-14 DIAGNOSIS — H25013 Cortical age-related cataract, bilateral: Secondary | ICD-10-CM | POA: Diagnosis not present

## 2021-03-20 DIAGNOSIS — M542 Cervicalgia: Secondary | ICD-10-CM | POA: Diagnosis not present

## 2021-03-20 DIAGNOSIS — M9901 Segmental and somatic dysfunction of cervical region: Secondary | ICD-10-CM | POA: Diagnosis not present

## 2021-03-20 DIAGNOSIS — G44209 Tension-type headache, unspecified, not intractable: Secondary | ICD-10-CM | POA: Diagnosis not present

## 2021-03-20 DIAGNOSIS — M47812 Spondylosis without myelopathy or radiculopathy, cervical region: Secondary | ICD-10-CM | POA: Diagnosis not present

## 2021-03-22 ENCOUNTER — Ambulatory Visit (INDEPENDENT_AMBULATORY_CARE_PROVIDER_SITE_OTHER): Payer: PPO

## 2021-03-22 ENCOUNTER — Other Ambulatory Visit: Payer: Self-pay

## 2021-03-22 DIAGNOSIS — D6851 Activated protein C resistance: Secondary | ICD-10-CM

## 2021-03-22 DIAGNOSIS — Z5181 Encounter for therapeutic drug level monitoring: Secondary | ICD-10-CM

## 2021-03-22 LAB — POCT INR: INR: 2.5 (ref 2.0–3.0)

## 2021-03-22 NOTE — Patient Instructions (Signed)
Continue same dosage 1 tablet daily except for 1.5 tablets on Mondays and Fridays. Recheck INR in 4 weeks.  [facility] [phone]

## 2021-03-27 DIAGNOSIS — G44209 Tension-type headache, unspecified, not intractable: Secondary | ICD-10-CM | POA: Diagnosis not present

## 2021-03-27 DIAGNOSIS — M9901 Segmental and somatic dysfunction of cervical region: Secondary | ICD-10-CM | POA: Diagnosis not present

## 2021-03-27 DIAGNOSIS — M47812 Spondylosis without myelopathy or radiculopathy, cervical region: Secondary | ICD-10-CM | POA: Diagnosis not present

## 2021-03-27 DIAGNOSIS — M542 Cervicalgia: Secondary | ICD-10-CM | POA: Diagnosis not present

## 2021-04-01 ENCOUNTER — Other Ambulatory Visit: Payer: Self-pay | Admitting: Neurology

## 2021-04-01 DIAGNOSIS — G20 Parkinson's disease: Secondary | ICD-10-CM

## 2021-04-02 ENCOUNTER — Telehealth: Payer: Self-pay

## 2021-04-02 ENCOUNTER — Other Ambulatory Visit: Payer: Self-pay

## 2021-04-02 NOTE — Telephone Encounter (Signed)
Called patient and let him know that you did not want to increase his carbidopa levodopa at this time and in fact to not take 3 additional pills per day. He is going to try the Klonopin 1/2 at bed and 1/2 when he wakes up in the night

## 2021-04-10 DIAGNOSIS — M542 Cervicalgia: Secondary | ICD-10-CM | POA: Diagnosis not present

## 2021-04-10 DIAGNOSIS — M47812 Spondylosis without myelopathy or radiculopathy, cervical region: Secondary | ICD-10-CM | POA: Diagnosis not present

## 2021-04-10 DIAGNOSIS — G44209 Tension-type headache, unspecified, not intractable: Secondary | ICD-10-CM | POA: Diagnosis not present

## 2021-04-10 DIAGNOSIS — M9901 Segmental and somatic dysfunction of cervical region: Secondary | ICD-10-CM | POA: Diagnosis not present

## 2021-04-16 NOTE — Progress Notes (Signed)
Virtual Visit Via Video   The purpose of this virtual visit is to provide medical care while limiting exposure to the novel coronavirus.    Consent was obtained for video visit:  Yes.  But had to convert to audio/phone visit because could not hear the patient Answered questions that patient had about telehealth interaction:  Yes.   I discussed the limitations, risks, security and privacy concerns of performing an evaluation and management service by telemedicine. I also discussed with the patient that there may be a patient responsible charge related to this service. The patient expressed understanding and agreed to proceed.  Pt location: Home Physician Location: office Name of referring provider:  Scifres, Dorothy, PA-C I connected with Revonda Standard at patients initiation/request on 04/17/2021 at  3:00 PM EDT by video enabled telemedicine application and verified that I am speaking with the correct person using two identifiers. Pt MRN:  [id number] Pt DOB:  August 01, 1945 Video Participants:  Revonda Standard;    Assessment/Plan:   1.   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               -Take carbidopa/levodopa 25/100, 1 tablet at 7 AM/10 AM/1 PM/4 PM/7 PM (he generally goes to bed around that time).  He had gone up and has now gone down on the pils to 3 tablets/day.  He had dropped off his levodopa to only 3 times per day.  I think that was making the restlessness worse.  -Continue carbidopa/levodopa 50/200 at bed             -The patient had a DaTscan in February, 2020 and there was near absent dopamine in the bilateral putamen.    -May do on/off test in the future but since he thinks levodopa working , we decided to hold off.  -walker at all times - has many falls but none with the walker.   2.  Cervical dystonia             -Patient wants to hold on Botox.   3.       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  -had surgery on eyes and it didn't help   4.  dysphagia             -feels that swallow is okay right now   5.  Atrial fibrillation             -Following with cardiology.  On diltiazem.   6.  Factor V Leiden             -On Coumadin.  7.  RBD  -Decrease clonazepam back to 0.5 mg half tablet at bedtime  -PDMP is reviewed.  No red flags.  8.  Restless leg  -Add low-dose pramipexole, 0.125 mg at bedtime.  We will need to watch for confusion.  Sent this to the pharmacy today.   Subjective:   Stanley Wright was seen today in follow up for Parkinsons disease.  My previous records were reviewed prior to todays visit as well as outside records available to me.  Pt worked in today to discuss several patient emails that he has sent.  He had stopped taking the carbidopa/levodopa 50/200 and was substituting immediate release instead, to help control  the restless leg.  He was running out of his immediate release, therefore, early and was taking about 3 extra pills per day.  I told him I did not want him to do this.  Ultimately, instead of a half a tablet of clonazepam at bedtime, I told him to try 1 full tablet.  He did that, but he did not find that that was useful, and went back to taking immediate release.  He tells me today that he has dropped his levodopa back to about 3 tablets/day.  He does find himself taking some in the middle of the night for restless legs.  Current prescribed movement disorder medications: Carbidopa/levodopa 25/100, 1 tablet 6 times per day  Carbidopa/levodopa to 50/200 at bedtime (out of it for the last week so has been using the 25/100 and he is running out of that too) Clonazepam 0.5 mg, half tablet at bedtime   PREVIOUS MEDICATIONS: Sinemet and Sinemet CR; Inbrija (patient had trouble using it)  ALLERGIES:   Allergies  Allergen Reactions   Tylenol [Acetaminophen] Other (See Comments)    Pt states he had a DNA test  completed that stated he should never take tylenol.    CURRENT MEDICATIONS:  Outpatient Encounter Medications as of 04/17/2021  Medication Sig   carbidopa-levodopa (SINEMET CR) 50-200 MG tablet Take 1 tablet by mouth at bedtime.   carbidopa-levodopa (SINEMET IR) 25-100 MG tablet TAKE 1 TABLET BY MOUTH FOUR TO  FIVE TIMES DAILY   clonazePAM (KLONOPIN) 0.5 MG tablet TAKE 1/2 (ONE-HALF) TABLET BY MOUTH AT BEDTIME   diltiazem (CARDIZEM CD) 360 MG 24 hr capsule Take 1 capsule (360 mg total) by mouth daily.   furosemide (LASIX) 40 MG tablet Take 1 tablet by mouth once daily   hydroxychloroquine (PLAQUENIL) 200 MG tablet Take 200 mg by mouth once a week.   warfarin (COUMADIN) 5 MG tablet TAKE 1 TO 1 & 1/2 (ONE & ONE-HALF) TABLETS BY MOUTH ONCE DAILY OR AS DIRECTED BY COUMADIN CLINIC   No facility-administered encounter medications on file as of 04/17/2021.    Objective:   PHYSICAL EXAMINATION:    VITALS:   There were no vitals filed for this visit.   Patient's speech was fluent, occasionally dysarthric (baseline).  He was alert and oriented x3.  I have reviewed and interpreted the following labs independently    Chemistry      Component Value Date/Time   NA 141 11/06/2020 0943   K 4.0 11/06/2020 0943   CL 101 11/06/2020 0943   CO2 24 11/06/2020 0943   BUN 22 11/06/2020 0943   CREATININE 1.06 11/06/2020 0943   CREATININE 0.88 06/12/2015 1505      Component Value Date/Time   CALCIUM 9.0 11/06/2020 0943   ALKPHOS 62 01/17/2019 1321   AST 12 01/17/2019 1321   ALT 6 01/17/2019 1321   BILITOT 0.4 01/17/2019 1321       Lab Results  Component Value Date   WBC 4.3 11/06/2020   HGB 14.9 11/06/2020   HCT 45.0 11/06/2020   MCV 97 11/06/2020   PLT 168 11/06/2020    Lab Results  Component Value Date   TSH 2.88 10/08/2015     Total time spent on today's visit was 12 minutes, including both face-to-face time and nonface-to-face time.  Time included that spent on review of  records (prior notes available to me/labs/imaging if pertinent), discussing treatment and goals, answering patient's questions and coordinating care.  Cc:  Scifres, Dorothy, [employer]

## 2021-04-17 ENCOUNTER — Telehealth (INDEPENDENT_AMBULATORY_CARE_PROVIDER_SITE_OTHER): Payer: PPO | Admitting: Neurology

## 2021-04-17 ENCOUNTER — Other Ambulatory Visit: Payer: Self-pay

## 2021-04-17 ENCOUNTER — Encounter: Payer: Self-pay | Admitting: Neurology

## 2021-04-17 DIAGNOSIS — M9901 Segmental and somatic dysfunction of cervical region: Secondary | ICD-10-CM | POA: Diagnosis not present

## 2021-04-17 DIAGNOSIS — G20 Parkinson's disease: Secondary | ICD-10-CM

## 2021-04-17 DIAGNOSIS — M47812 Spondylosis without myelopathy or radiculopathy, cervical region: Secondary | ICD-10-CM | POA: Diagnosis not present

## 2021-04-17 DIAGNOSIS — G44209 Tension-type headache, unspecified, not intractable: Secondary | ICD-10-CM | POA: Diagnosis not present

## 2021-04-17 DIAGNOSIS — M542 Cervicalgia: Secondary | ICD-10-CM | POA: Diagnosis not present

## 2021-04-17 DIAGNOSIS — G2581 Restless legs syndrome: Secondary | ICD-10-CM | POA: Diagnosis not present

## 2021-04-17 MED ORDER — CARBIDOPA-LEVODOPA 25-100 MG PO TABS: ORAL_TABLET | ORAL | 0 refills | Status: DC

## 2021-04-17 MED ORDER — PRAMIPEXOLE DIHYDROCHLORIDE 0.125 MG PO TABS: 0.1250 mg | ORAL_TABLET | Freq: Every day | ORAL | 1 refills | Status: DC

## 2021-04-19 ENCOUNTER — Ambulatory Visit (INDEPENDENT_AMBULATORY_CARE_PROVIDER_SITE_OTHER): Payer: PPO

## 2021-04-19 ENCOUNTER — Other Ambulatory Visit: Payer: Self-pay

## 2021-04-19 DIAGNOSIS — I4819 Other persistent atrial fibrillation: Secondary | ICD-10-CM | POA: Diagnosis not present

## 2021-04-19 DIAGNOSIS — Z5181 Encounter for therapeutic drug level monitoring: Secondary | ICD-10-CM | POA: Diagnosis not present

## 2021-04-19 LAB — POCT INR: INR: 2.1 (ref 2.0–3.0)

## 2021-04-19 NOTE — Patient Instructions (Signed)
Description   Continue same dosage 1 tablet daily except for 1.5 tablets on Mondays and Fridays. Recheck INR in 5 weeks.  [facility] [phone]

## 2021-04-24 DIAGNOSIS — G44209 Tension-type headache, unspecified, not intractable: Secondary | ICD-10-CM | POA: Diagnosis not present

## 2021-04-24 DIAGNOSIS — M9901 Segmental and somatic dysfunction of cervical region: Secondary | ICD-10-CM | POA: Diagnosis not present

## 2021-04-24 DIAGNOSIS — M542 Cervicalgia: Secondary | ICD-10-CM | POA: Diagnosis not present

## 2021-04-24 DIAGNOSIS — M47812 Spondylosis without myelopathy or radiculopathy, cervical region: Secondary | ICD-10-CM | POA: Diagnosis not present

## 2021-04-29 DIAGNOSIS — I5032 Chronic diastolic (congestive) heart failure: Secondary | ICD-10-CM | POA: Diagnosis not present

## 2021-04-29 DIAGNOSIS — G20 Parkinson's disease: Secondary | ICD-10-CM | POA: Diagnosis not present

## 2021-04-29 DIAGNOSIS — H35033 Hypertensive retinopathy, bilateral: Secondary | ICD-10-CM | POA: Diagnosis not present

## 2021-05-01 DIAGNOSIS — M47812 Spondylosis without myelopathy or radiculopathy, cervical region: Secondary | ICD-10-CM | POA: Diagnosis not present

## 2021-05-01 DIAGNOSIS — M9901 Segmental and somatic dysfunction of cervical region: Secondary | ICD-10-CM | POA: Diagnosis not present

## 2021-05-01 DIAGNOSIS — M542 Cervicalgia: Secondary | ICD-10-CM | POA: Diagnosis not present

## 2021-05-01 DIAGNOSIS — G44209 Tension-type headache, unspecified, not intractable: Secondary | ICD-10-CM | POA: Diagnosis not present

## 2021-05-02 ENCOUNTER — Other Ambulatory Visit: Payer: Self-pay | Admitting: Cardiology

## 2021-05-08 DIAGNOSIS — M542 Cervicalgia: Secondary | ICD-10-CM | POA: Diagnosis not present

## 2021-05-08 DIAGNOSIS — M9901 Segmental and somatic dysfunction of cervical region: Secondary | ICD-10-CM | POA: Diagnosis not present

## 2021-05-08 DIAGNOSIS — G44209 Tension-type headache, unspecified, not intractable: Secondary | ICD-10-CM | POA: Diagnosis not present

## 2021-05-08 DIAGNOSIS — M47812 Spondylosis without myelopathy or radiculopathy, cervical region: Secondary | ICD-10-CM | POA: Diagnosis not present

## 2021-05-15 DIAGNOSIS — G44209 Tension-type headache, unspecified, not intractable: Secondary | ICD-10-CM | POA: Diagnosis not present

## 2021-05-15 DIAGNOSIS — M47812 Spondylosis without myelopathy or radiculopathy, cervical region: Secondary | ICD-10-CM | POA: Diagnosis not present

## 2021-05-15 DIAGNOSIS — M9901 Segmental and somatic dysfunction of cervical region: Secondary | ICD-10-CM | POA: Diagnosis not present

## 2021-05-15 DIAGNOSIS — M542 Cervicalgia: Secondary | ICD-10-CM | POA: Diagnosis not present

## 2021-05-22 DIAGNOSIS — G44209 Tension-type headache, unspecified, not intractable: Secondary | ICD-10-CM | POA: Diagnosis not present

## 2021-05-22 DIAGNOSIS — M47812 Spondylosis without myelopathy or radiculopathy, cervical region: Secondary | ICD-10-CM | POA: Diagnosis not present

## 2021-05-22 DIAGNOSIS — M9901 Segmental and somatic dysfunction of cervical region: Secondary | ICD-10-CM | POA: Diagnosis not present

## 2021-05-22 DIAGNOSIS — M542 Cervicalgia: Secondary | ICD-10-CM | POA: Diagnosis not present

## 2021-05-24 ENCOUNTER — Ambulatory Visit (INDEPENDENT_AMBULATORY_CARE_PROVIDER_SITE_OTHER): Payer: PPO

## 2021-05-24 ENCOUNTER — Other Ambulatory Visit: Payer: Self-pay

## 2021-05-24 DIAGNOSIS — Z5181 Encounter for therapeutic drug level monitoring: Secondary | ICD-10-CM

## 2021-05-24 DIAGNOSIS — I4819 Other persistent atrial fibrillation: Secondary | ICD-10-CM | POA: Diagnosis not present

## 2021-05-24 LAB — POCT INR: INR: 1.6 — AB (ref 2.0–3.0)

## 2021-05-24 NOTE — Patient Instructions (Signed)
Description   Take 2 tablets today and then continue same dosage 1 tablet daily except for 1.5 tablets on Mondays and Fridays. Recheck INR in 2 weeks.  [facility] [phone]

## 2021-05-29 DIAGNOSIS — M9901 Segmental and somatic dysfunction of cervical region: Secondary | ICD-10-CM | POA: Diagnosis not present

## 2021-05-29 DIAGNOSIS — G44209 Tension-type headache, unspecified, not intractable: Secondary | ICD-10-CM | POA: Diagnosis not present

## 2021-05-29 DIAGNOSIS — M542 Cervicalgia: Secondary | ICD-10-CM | POA: Diagnosis not present

## 2021-05-29 DIAGNOSIS — M47812 Spondylosis without myelopathy or radiculopathy, cervical region: Secondary | ICD-10-CM | POA: Diagnosis not present

## 2021-06-05 DIAGNOSIS — M542 Cervicalgia: Secondary | ICD-10-CM | POA: Diagnosis not present

## 2021-06-05 DIAGNOSIS — M9901 Segmental and somatic dysfunction of cervical region: Secondary | ICD-10-CM | POA: Diagnosis not present

## 2021-06-05 DIAGNOSIS — G44209 Tension-type headache, unspecified, not intractable: Secondary | ICD-10-CM | POA: Diagnosis not present

## 2021-06-05 DIAGNOSIS — M47812 Spondylosis without myelopathy or radiculopathy, cervical region: Secondary | ICD-10-CM | POA: Diagnosis not present

## 2021-06-06 ENCOUNTER — Ambulatory Visit (INDEPENDENT_AMBULATORY_CARE_PROVIDER_SITE_OTHER): Payer: PPO | Admitting: *Deleted

## 2021-06-06 ENCOUNTER — Other Ambulatory Visit: Payer: Self-pay

## 2021-06-06 DIAGNOSIS — Z5181 Encounter for therapeutic drug level monitoring: Secondary | ICD-10-CM

## 2021-06-06 DIAGNOSIS — D6851 Activated protein C resistance: Secondary | ICD-10-CM | POA: Diagnosis not present

## 2021-06-06 DIAGNOSIS — I4819 Other persistent atrial fibrillation: Secondary | ICD-10-CM

## 2021-06-06 LAB — POCT INR: INR: 3.1 — AB (ref 2.0–3.0)

## 2021-06-06 NOTE — Patient Instructions (Signed)
Description   Today take 1/2 tablet then continue same dosage 1 tablet daily except for 1.5 tablets on Mondays and Fridays. Recheck INR in 3 weeks.  [facility] [phone]

## 2021-06-08 ENCOUNTER — Other Ambulatory Visit: Payer: Self-pay | Admitting: Cardiology

## 2021-06-19 DIAGNOSIS — M542 Cervicalgia: Secondary | ICD-10-CM | POA: Diagnosis not present

## 2021-06-19 DIAGNOSIS — M9901 Segmental and somatic dysfunction of cervical region: Secondary | ICD-10-CM | POA: Diagnosis not present

## 2021-06-19 DIAGNOSIS — M47812 Spondylosis without myelopathy or radiculopathy, cervical region: Secondary | ICD-10-CM | POA: Diagnosis not present

## 2021-06-19 DIAGNOSIS — G44209 Tension-type headache, unspecified, not intractable: Secondary | ICD-10-CM | POA: Diagnosis not present

## 2021-06-25 DIAGNOSIS — D6851 Activated protein C resistance: Secondary | ICD-10-CM | POA: Diagnosis not present

## 2021-06-25 DIAGNOSIS — G232 Striatonigral degeneration: Secondary | ICD-10-CM | POA: Diagnosis not present

## 2021-06-25 DIAGNOSIS — I509 Heart failure, unspecified: Secondary | ICD-10-CM | POA: Diagnosis not present

## 2021-06-25 DIAGNOSIS — E669 Obesity, unspecified: Secondary | ICD-10-CM | POA: Diagnosis not present

## 2021-06-25 DIAGNOSIS — H9193 Unspecified hearing loss, bilateral: Secondary | ICD-10-CM | POA: Diagnosis not present

## 2021-06-25 DIAGNOSIS — D684 Acquired coagulation factor deficiency: Secondary | ICD-10-CM | POA: Diagnosis not present

## 2021-06-25 DIAGNOSIS — D84821 Immunodeficiency due to drugs: Secondary | ICD-10-CM | POA: Diagnosis not present

## 2021-06-25 DIAGNOSIS — G20 Parkinson's disease: Secondary | ICD-10-CM | POA: Diagnosis not present

## 2021-06-25 DIAGNOSIS — E261 Secondary hyperaldosteronism: Secondary | ICD-10-CM | POA: Diagnosis not present

## 2021-06-25 DIAGNOSIS — D6869 Other thrombophilia: Secondary | ICD-10-CM | POA: Diagnosis not present

## 2021-06-25 DIAGNOSIS — H259 Unspecified age-related cataract: Secondary | ICD-10-CM | POA: Diagnosis not present

## 2021-06-25 DIAGNOSIS — I4891 Unspecified atrial fibrillation: Secondary | ICD-10-CM | POA: Diagnosis not present

## 2021-06-26 DIAGNOSIS — M9901 Segmental and somatic dysfunction of cervical region: Secondary | ICD-10-CM | POA: Diagnosis not present

## 2021-06-26 DIAGNOSIS — G44209 Tension-type headache, unspecified, not intractable: Secondary | ICD-10-CM | POA: Diagnosis not present

## 2021-06-26 DIAGNOSIS — M47812 Spondylosis without myelopathy or radiculopathy, cervical region: Secondary | ICD-10-CM | POA: Diagnosis not present

## 2021-06-26 DIAGNOSIS — M542 Cervicalgia: Secondary | ICD-10-CM | POA: Diagnosis not present

## 2021-06-28 ENCOUNTER — Ambulatory Visit (INDEPENDENT_AMBULATORY_CARE_PROVIDER_SITE_OTHER): Payer: PPO

## 2021-06-28 ENCOUNTER — Other Ambulatory Visit: Payer: Self-pay

## 2021-06-28 DIAGNOSIS — Z5181 Encounter for therapeutic drug level monitoring: Secondary | ICD-10-CM

## 2021-06-28 DIAGNOSIS — I4819 Other persistent atrial fibrillation: Secondary | ICD-10-CM

## 2021-06-28 DIAGNOSIS — D6851 Activated protein C resistance: Secondary | ICD-10-CM

## 2021-06-28 LAB — POCT INR: INR: 1.7 — AB (ref 2.0–3.0)

## 2021-06-28 NOTE — Patient Instructions (Signed)
Today take 2 tablets then continue same dosage 1 tablet daily except for 1.5 tablets on Mondays and Fridays. Recheck INR in 3 weeks.  [facility] [phone]

## 2021-06-30 ENCOUNTER — Other Ambulatory Visit: Payer: Self-pay | Admitting: Cardiology

## 2021-06-30 DIAGNOSIS — I5032 Chronic diastolic (congestive) heart failure: Secondary | ICD-10-CM

## 2021-07-03 DIAGNOSIS — M47812 Spondylosis without myelopathy or radiculopathy, cervical region: Secondary | ICD-10-CM | POA: Diagnosis not present

## 2021-07-03 DIAGNOSIS — M542 Cervicalgia: Secondary | ICD-10-CM | POA: Diagnosis not present

## 2021-07-03 DIAGNOSIS — G44209 Tension-type headache, unspecified, not intractable: Secondary | ICD-10-CM | POA: Diagnosis not present

## 2021-07-03 DIAGNOSIS — M9901 Segmental and somatic dysfunction of cervical region: Secondary | ICD-10-CM | POA: Diagnosis not present

## 2021-07-10 DIAGNOSIS — M47812 Spondylosis without myelopathy or radiculopathy, cervical region: Secondary | ICD-10-CM | POA: Diagnosis not present

## 2021-07-10 DIAGNOSIS — G44209 Tension-type headache, unspecified, not intractable: Secondary | ICD-10-CM | POA: Diagnosis not present

## 2021-07-10 DIAGNOSIS — M542 Cervicalgia: Secondary | ICD-10-CM | POA: Diagnosis not present

## 2021-07-10 DIAGNOSIS — M9901 Segmental and somatic dysfunction of cervical region: Secondary | ICD-10-CM | POA: Diagnosis not present

## 2021-07-15 ENCOUNTER — Other Ambulatory Visit: Payer: Self-pay | Admitting: Neurology

## 2021-07-15 DIAGNOSIS — G20 Parkinson's disease: Secondary | ICD-10-CM

## 2021-07-19 ENCOUNTER — Ambulatory Visit (INDEPENDENT_AMBULATORY_CARE_PROVIDER_SITE_OTHER): Payer: PPO | Admitting: *Deleted

## 2021-07-19 ENCOUNTER — Other Ambulatory Visit: Payer: Self-pay

## 2021-07-19 DIAGNOSIS — D6851 Activated protein C resistance: Secondary | ICD-10-CM | POA: Diagnosis not present

## 2021-07-19 DIAGNOSIS — Z5181 Encounter for therapeutic drug level monitoring: Secondary | ICD-10-CM

## 2021-07-19 DIAGNOSIS — I4819 Other persistent atrial fibrillation: Secondary | ICD-10-CM

## 2021-07-19 LAB — POCT INR: INR: 2.8 (ref 2.0–3.0)

## 2021-07-19 NOTE — Patient Instructions (Signed)
Description   Continue taking 1 tablet daily except for 1.5 tablets on Mondays and Fridays. Recheck INR in 4 weeks.  [facility] [phone]

## 2021-07-30 ENCOUNTER — Other Ambulatory Visit: Payer: Self-pay | Admitting: Neurology

## 2021-07-30 DIAGNOSIS — G20 Parkinson's disease: Secondary | ICD-10-CM

## 2021-08-14 DIAGNOSIS — G44209 Tension-type headache, unspecified, not intractable: Secondary | ICD-10-CM | POA: Diagnosis not present

## 2021-08-14 DIAGNOSIS — M542 Cervicalgia: Secondary | ICD-10-CM | POA: Diagnosis not present

## 2021-08-14 DIAGNOSIS — M47812 Spondylosis without myelopathy or radiculopathy, cervical region: Secondary | ICD-10-CM | POA: Diagnosis not present

## 2021-08-14 DIAGNOSIS — M9901 Segmental and somatic dysfunction of cervical region: Secondary | ICD-10-CM | POA: Diagnosis not present

## 2021-08-16 ENCOUNTER — Other Ambulatory Visit: Payer: Self-pay

## 2021-08-16 ENCOUNTER — Ambulatory Visit (INDEPENDENT_AMBULATORY_CARE_PROVIDER_SITE_OTHER): Payer: PPO | Admitting: *Deleted

## 2021-08-16 DIAGNOSIS — I4819 Other persistent atrial fibrillation: Secondary | ICD-10-CM

## 2021-08-16 DIAGNOSIS — D6851 Activated protein C resistance: Secondary | ICD-10-CM

## 2021-08-16 DIAGNOSIS — Z5181 Encounter for therapeutic drug level monitoring: Secondary | ICD-10-CM | POA: Diagnosis not present

## 2021-08-16 LAB — POCT INR: INR: 1.6 — AB (ref 2.0–3.0)

## 2021-08-16 NOTE — Patient Instructions (Signed)
Description   ?Today take 2 tablets then continue taking 1 tablet daily except for 1.5 tablets on Mondays and Fridays. Recheck INR in 3 weeks.  [facility] [phone] ?  ?  ?

## 2021-08-21 DIAGNOSIS — G44209 Tension-type headache, unspecified, not intractable: Secondary | ICD-10-CM | POA: Diagnosis not present

## 2021-08-21 DIAGNOSIS — M542 Cervicalgia: Secondary | ICD-10-CM | POA: Diagnosis not present

## 2021-08-21 DIAGNOSIS — M9901 Segmental and somatic dysfunction of cervical region: Secondary | ICD-10-CM | POA: Diagnosis not present

## 2021-08-21 DIAGNOSIS — M47812 Spondylosis without myelopathy or radiculopathy, cervical region: Secondary | ICD-10-CM | POA: Diagnosis not present

## 2021-08-28 DIAGNOSIS — G44209 Tension-type headache, unspecified, not intractable: Secondary | ICD-10-CM | POA: Diagnosis not present

## 2021-08-28 DIAGNOSIS — M47812 Spondylosis without myelopathy or radiculopathy, cervical region: Secondary | ICD-10-CM | POA: Diagnosis not present

## 2021-08-28 DIAGNOSIS — M542 Cervicalgia: Secondary | ICD-10-CM | POA: Diagnosis not present

## 2021-08-28 DIAGNOSIS — M9901 Segmental and somatic dysfunction of cervical region: Secondary | ICD-10-CM | POA: Diagnosis not present

## 2021-09-04 DIAGNOSIS — M47812 Spondylosis without myelopathy or radiculopathy, cervical region: Secondary | ICD-10-CM | POA: Diagnosis not present

## 2021-09-04 DIAGNOSIS — M542 Cervicalgia: Secondary | ICD-10-CM | POA: Diagnosis not present

## 2021-09-04 DIAGNOSIS — G44209 Tension-type headache, unspecified, not intractable: Secondary | ICD-10-CM | POA: Diagnosis not present

## 2021-09-04 DIAGNOSIS — M9901 Segmental and somatic dysfunction of cervical region: Secondary | ICD-10-CM | POA: Diagnosis not present

## 2021-09-06 ENCOUNTER — Ambulatory Visit (INDEPENDENT_AMBULATORY_CARE_PROVIDER_SITE_OTHER): Payer: PPO

## 2021-09-06 ENCOUNTER — Other Ambulatory Visit: Payer: Self-pay

## 2021-09-06 DIAGNOSIS — Z5181 Encounter for therapeutic drug level monitoring: Secondary | ICD-10-CM | POA: Diagnosis not present

## 2021-09-06 DIAGNOSIS — I4819 Other persistent atrial fibrillation: Secondary | ICD-10-CM

## 2021-09-06 DIAGNOSIS — D6851 Activated protein C resistance: Secondary | ICD-10-CM | POA: Diagnosis not present

## 2021-09-06 LAB — POCT INR: INR: 2.6 (ref 2.0–3.0)

## 2021-09-06 NOTE — Patient Instructions (Signed)
-   continue taking 1 tablet daily except for 1.5 tablets on Mondays and Fridays.  ?- Recheck INR in 4 weeks.   ?[facility] [phone] ?

## 2021-09-07 DIAGNOSIS — Z1152 Encounter for screening for COVID-19: Secondary | ICD-10-CM | POA: Diagnosis not present

## 2021-09-23 NOTE — Progress Notes (Signed)
? ? ?Assessment/Plan:  ? ?1.   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   ?            -Take carbidopa/levodopa 25/100, 1 tablet at 7 AM/10 AM/1 PM/4 PM/7 PM.  He sometimes takes a 6th pill ? -Take carbidopa/levodopa 50/200 CR at bedtime ?            -The patient had a DaTscan in February, 2020 and there was near absent dopamine in the bilateral putamen.   ? -Can consider on/off in the future, but overall patient thinks levodopa is helping. ? -walker at all times - has several falls but none with the walker.  Discussed this again today.  He has a U step walker and just needs to use that at all times. ? -handicap placard filled out ?  ?2.  Cervical dystonia ?            -Patient wants to hold on Botox. ?  ?3.       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            -Ach R ab's neg ?            -Patient did not find Botox helpful and we discontinued it. ? -had surgery on eyes and it didn't help ?  ?4.  dysphagia ?            -feels that swallow is okay right now ?  ?5.  Atrial fibrillation ?            -Following with cardiology.  On diltiazem. ?  ?6.  Factor V Leiden ?            -On Coumadin. ? ?7.  RBD and restless leg ? -Clonazepam, 0.5 mg, half tablet at bedtime.  The addition was helpful ? -PDMP is reviewed.  No red flags.  Last filled September 07, 2021 for 90 days ? -Continue pramipexole, 0.125 mg at bedtime.  This low dosage has helped. ? ? ?Subjective:  ? ?Revonda Standard was seen today in follow up for Parkinsons disease.  My previous records were reviewed prior to todays visit as well as outside records available to me.  When I saw the patient last visit through video he had decreased his levodopa from 7 tablets/day down to 3 tablets/day.  I had him increase back the levodopa to 5 tablets/day (in addition to the bedtime CR dosage).  I also asked him to reduce back his clonazepam to half a tablet at bedtime, as higher dosages did not help for restless leg.  We did add low-dose pramipexole last  visit at bedtime for the restless leg and he reports today that it helped.  Averages about 1 fall a month - all without walker. ? ?Current prescribed movement disorder medications: ?Carbidopa/levodopa 25/100, 1 tablet at 7 AM/10 AM/1 PM/4 PM/7 PM (takes 6 per day sometimes) ?Carbidopa/levodopa to 50/200 at bedtime  ?Clonazepam 0.5 mg, half tablet at bedtime ?Pramipexole 0.125 mg at bedtime (started last visit) ?  ?PREVIOUS MEDICATIONS: Sinemet and Sinemet CR; Inbrija (patient had trouble using it); clonazepam, 0.5 mg, 1 tablet at bedtime (did not help more than the half tablet) ? ?ALLERGIES:   ?Allergies  ?Allergen Reactions  ? Tylenol [Acetaminophen] Other (See Comments)  ?  Pt states he had a DNA test completed that stated he should never take tylenol.  ? ? ?CURRENT MEDICATIONS:  ?Outpatient Encounter Medications as of 10/01/2021  ?Medication Sig  ? carbidopa-levodopa (  SINEMET CR) 50-200 MG tablet Take 1 tablet by mouth at bedtime.  ? carbidopa-levodopa (SINEMET IR) 25-100 MG tablet 1 tablet 6 times per day  ? clonazePAM (KLONOPIN) 0.5 MG tablet TAKE 1/2 (ONE-HALF) TABLET BY MOUTH AT BEDTIME  ? diltiazem (CARDIZEM CD) 360 MG 24 hr capsule Take 1 capsule by mouth once daily  ? furosemide (LASIX) 40 MG tablet Take 1 tablet by mouth once daily  ? hydroxychloroquine (PLAQUENIL) 200 MG tablet Take 200 mg by mouth once a week.  ? pramipexole (MIRAPEX) 0.125 MG tablet Take 1 tablet (0.125 mg total) by mouth at bedtime.  ? warfarin (COUMADIN) 5 MG tablet TAKE 1 TO 1 & 1/2 (ONE & ONE-HALF) TABLETS BY MOUTH ONCE DAILY OR AS DIRECTED BY COUMADIN CLINIC.  ? ?No facility-administered encounter medications on file as of 10/01/2021.  ? ? ?Objective:  ? ?PHYSICAL EXAMINATION:   ? ?VITALS:   ?Vitals:  ? 10/01/21 1358  ?BP: 110/82  ?Pulse: 71  ?SpO2: 97%  ?Weight: 255 lb (115.7 kg)  ? ? ? ? ?GEN:  The patient appears stated age and is in NAD. ?HEENT:  Normocephalic, atraumatic.  The mucous membranes are moist.  ?Neck/HEME:  There are  no carotid bruits bilaterally.  Chin approximates the chest.  Neck is flexed.  R ear to R shoulder ? ?Neurological examination: ? ?Orientation: The patient is alert and oriented x3. ?Cranial nerves: There is good facial symmetry with facial hypomimia.  He has complete upgaze paresis and mild downgaze paresis.  The speech is fluent and dysarthric and hypophonic. Soft palate rises symmetrically and there is no tongue deviation. Hearing is intact to conversational tone. ?Sensation: Sensation is intact to light touch throughout ?Motor: Strength is at least antigravity x4. ? ? ?Movement examination: ?Tone: There is nl tone in the UE ?Abnormal movements: none ?Coordination:  There is decremation with RAM's, with any form of RAMS, including alternating supination and pronation of the forearm, hand opening and closing, finger taps, heel taps and toe taps on the L>R ?Gait and Station: Ambulates ok with walker.  Drags the L leg, partially due to foot drop on the left. ? ?I have reviewed and interpreted the following labs independently ? ?  Chemistry   ?   ?Component Value Date/Time  ? NA 141 11/06/2020 0943  ? K 4.0 11/06/2020 0943  ? CL 101 11/06/2020 0943  ? CO2 24 11/06/2020 0943  ? BUN 22 11/06/2020 0943  ? CREATININE 1.06 11/06/2020 0943  ? CREATININE 0.88 06/12/2015 1505  ?    ?Component Value Date/Time  ? CALCIUM 9.0 11/06/2020 0943  ? ALKPHOS 62 01/17/2019 1321  ? AST 12 01/17/2019 1321  ? ALT 6 01/17/2019 1321  ? BILITOT 0.4 01/17/2019 1321  ?  ? ? ? ?Lab Results  ?Component Value Date  ? WBC 4.3 11/06/2020  ? HGB 14.9 11/06/2020  ? HCT 45.0 11/06/2020  ? MCV 97 11/06/2020  ? PLT 168 11/06/2020  ? ? ?Lab Results  ?Component Value Date  ? TSH 2.88 10/08/2015  ? ? ? ? ?Cc:  Scifres, Dorothy, PA-C ? ?

## 2021-09-25 DIAGNOSIS — M47812 Spondylosis without myelopathy or radiculopathy, cervical region: Secondary | ICD-10-CM | POA: Diagnosis not present

## 2021-09-25 DIAGNOSIS — M542 Cervicalgia: Secondary | ICD-10-CM | POA: Diagnosis not present

## 2021-09-25 DIAGNOSIS — G44209 Tension-type headache, unspecified, not intractable: Secondary | ICD-10-CM | POA: Diagnosis not present

## 2021-09-25 DIAGNOSIS — M9901 Segmental and somatic dysfunction of cervical region: Secondary | ICD-10-CM | POA: Diagnosis not present

## 2021-10-01 ENCOUNTER — Encounter: Payer: Self-pay | Admitting: Neurology

## 2021-10-01 ENCOUNTER — Ambulatory Visit: Payer: PPO | Admitting: Neurology

## 2021-10-01 DIAGNOSIS — G20 Parkinson's disease: Secondary | ICD-10-CM

## 2021-10-01 MED ORDER — CARBIDOPA-LEVODOPA ER 50-200 MG PO TBCR: 1.0000 | EXTENDED_RELEASE_TABLET | Freq: Every day | ORAL | 2 refills | Status: DC

## 2021-10-01 MED ORDER — CARBIDOPA-LEVODOPA 25-100 MG PO TABS: ORAL_TABLET | ORAL | 1 refills | Status: DC

## 2021-10-01 MED ORDER — PRAMIPEXOLE DIHYDROCHLORIDE 0.125 MG PO TABS
0.1250 mg | ORAL_TABLET | Freq: Every day | ORAL | 1 refills | Status: DC
Start: 2021-10-01 — End: 2022-02-04

## 2021-10-02 DIAGNOSIS — M47812 Spondylosis without myelopathy or radiculopathy, cervical region: Secondary | ICD-10-CM | POA: Diagnosis not present

## 2021-10-02 DIAGNOSIS — M542 Cervicalgia: Secondary | ICD-10-CM | POA: Diagnosis not present

## 2021-10-02 DIAGNOSIS — M9901 Segmental and somatic dysfunction of cervical region: Secondary | ICD-10-CM | POA: Diagnosis not present

## 2021-10-02 DIAGNOSIS — G44209 Tension-type headache, unspecified, not intractable: Secondary | ICD-10-CM | POA: Diagnosis not present

## 2021-10-09 DIAGNOSIS — M9901 Segmental and somatic dysfunction of cervical region: Secondary | ICD-10-CM | POA: Diagnosis not present

## 2021-10-09 DIAGNOSIS — M542 Cervicalgia: Secondary | ICD-10-CM | POA: Diagnosis not present

## 2021-10-09 DIAGNOSIS — G44209 Tension-type headache, unspecified, not intractable: Secondary | ICD-10-CM | POA: Diagnosis not present

## 2021-10-09 DIAGNOSIS — M47812 Spondylosis without myelopathy or radiculopathy, cervical region: Secondary | ICD-10-CM | POA: Diagnosis not present

## 2021-10-10 ENCOUNTER — Encounter: Payer: Self-pay | Admitting: Physical Therapy

## 2021-10-10 ENCOUNTER — Ambulatory Visit: Payer: PPO | Admitting: Speech Pathology

## 2021-10-10 ENCOUNTER — Ambulatory Visit: Payer: PPO | Attending: Neurology | Admitting: Physical Therapy

## 2021-10-10 DIAGNOSIS — R471 Dysarthria and anarthria: Secondary | ICD-10-CM

## 2021-10-10 DIAGNOSIS — Z9181 History of falling: Secondary | ICD-10-CM | POA: Insufficient documentation

## 2021-10-10 DIAGNOSIS — R2689 Other abnormalities of gait and mobility: Secondary | ICD-10-CM | POA: Insufficient documentation

## 2021-10-10 DIAGNOSIS — R2681 Unsteadiness on feet: Secondary | ICD-10-CM | POA: Diagnosis not present

## 2021-10-10 DIAGNOSIS — G20 Parkinson's disease: Secondary | ICD-10-CM | POA: Insufficient documentation

## 2021-10-10 DIAGNOSIS — R29818 Other symptoms and signs involving the nervous system: Secondary | ICD-10-CM | POA: Insufficient documentation

## 2021-10-10 DIAGNOSIS — M6281 Muscle weakness (generalized): Secondary | ICD-10-CM | POA: Insufficient documentation

## 2021-10-10 NOTE — Therapy (Signed)
?OUTPATIENT PHYSICAL THERAPY NEURO EVALUATION ? ? ?Patient Name: Stanley Wright ?MRN: [id number] ?DOB:02-Mar-1946, 76 y.o., male ?Today's Date: 10/10/2021 ? ?PCP: Scifres, Dorothy, PA-C ?REFERRING PROVIDER: Ludwig Clarks, DO ? ? PT End of Session - 10/10/21 0853   ? ? Visit Number 1   ? Number of Visits 13   ? Date for PT Re-Evaluation 01/08/22   due to delay in scheduling  ? Authorization Type [employer]   ? PT Start Time 0802   ? PT Stop Time 0845   ? PT Time Calculation (min) 43 min   ? Equipment Utilized During Treatment Gait belt   ? Activity Tolerance Patient tolerated treatment well   ? Behavior During Therapy [facility] for tasks assessed/performed   ? ?  ?  ? ?  ? ? ?Past Medical History:  ?Diagnosis Date  ? Arthritis   ? BENIGN PROSTATIC HYPERTROPHY, WITH OBSTRUCTION   ? 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 COLONIC POLYPS   ? Diverticulitis   ? DVT (deep venous thrombosis) ([place]) 2000  ? Dysrhythmia   ? Hx Afib- 2017  ? Early cataract   ? Factor 5 Leiden mutation, heterozygous ([place])   ? GERD (gastroesophageal reflux disease)   ? not on medication  ? Hearing aid worn   ? B/L  ? HIATAL HERNIA   ? ITP (idiopathic thrombocytopenic purpura) 2003  ? Long term current use of anticoagulant   ? Osteoarthritis   ? right foot  ? Parkinson's disease ([place]) 02/03/2018  ? PSORIASIS   ? Pulmonary embolus ([place]) 2000  ? Shortness of breath dyspnea   ? at times - Talking alot  ? Sleep apnea   ? Wears glasses   ? ?Past Surgical History:  ?Procedure Laterality Date  ? ANKLE FUSION Right 08/24/2019  ? Procedure: RIGHT TALO-NAVICULAR FUSION;  Surgeon: Newt Minion, MD;  Location: [place];  Service: Orthopedics;  Laterality: Right;  ? CARDIOVERSION N/A 11/22/2015  ? Procedure: CARDIOVERSION;  Surgeon: Sanda Klein, MD;  [facility];  Service: Cardiovascular;  Laterality: N/A;  ? COLONOSCOPY    ? HERNIA REPAIR Left   ? Inguinal- x2 . mesh x1  ? INSERTION OF MESH N/A 02/25/2016  ? Procedure: INSERTION OF MESH;   Surgeon: Rolm Bookbinder, MD;  Location: [place];  Service: General;  Laterality: N/A;  ? LUNG REMOVAL, PARTIAL Right 2004  ? was fungal and not cancerous  ? PROSTATE SURGERY    ? UMBILICAL HERNIA REPAIR N/A 02/25/2016  ? Procedure: LAPAROSCOPIC UMBILICAL HERNIA REPAIR;  Surgeon: Rolm Bookbinder, MD;  Location: [place];  Service: General;  Laterality: N/A;  ? ?Patient Active Problem List  ? Diagnosis Date Noted  ? Primary osteoarthritis, right ankle and foot   ? Lower extremity edema 08/10/2019  ? Acute DVT (deep venous thrombosis) ([place]) 07/19/2019  ? History of pulmonary embolism 07/19/2019  ? Parkinson's disease ([place]) 02/03/2018  ? Community acquired pneumonia of left lower lobe of lung 12/23/2017  ? Sepsis ([place]) 12/23/2017  ? Morbid obesity due to excess calories ([place]) 08/23/2016  ? OSA on CPAP 08/23/2016  ? Dyspnea on exertion 08/22/2016  ? Umbilical hernia 88/91/6945  ? Persistent atrial fibrillation ([place])   ? Encounter for therapeutic drug monitoring 08/12/2013  ? Long term current use of anticoagulant 07/30/2010  ? COLONIC POLYPS 06/03/2010  ? Factor V Leiden ([place]) 06/03/2010  ? GERD 06/03/2010  ? HIATAL HERNIA 06/03/2010  ? BENIGN PROSTATIC HYPERTROPHY, WITH OBSTRUCTION 06/03/2010  ?  PSORIASIS 06/03/2010  ? ? ?ONSET DATE: 10/01/2021 (date of referral) ? ?REFERRING DIAG: G20 (ICD-10-CM) - Primary parkinsonism ([place])  ? ?THERAPY DIAG:  ?Other abnormalities of gait and mobility ? ?Other symptoms and signs involving the nervous system ? ?Muscle weakness (generalized) ? ?Unsteadiness on feet ? ?History of falling ? ?SUBJECTIVE:  ?                                                                                                                                                                                           ? ?SUBJECTIVE STATEMENT: ?Wants to learn how to walk again. Feels like he is shuffling more and occasional gets frozen- esp in tight spaces. Uses the U-step walker whenever he can and has been using it  more. Turning is challenging. Falls about 1 time a month (these have been when he has had no AD). Has a walking stick that he uses when he is outside on unlevel surfaces like the mud (uses this everyday). Just walking for exercise.  ? ?Pt accompanied by: self ? 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 Doristine Devoid notes), cervical dystonia, a fib, R ankle fusion 2021,  arthritis,DVT, A-fib, GERD, sleep apnea  ? ?The patient had a DaTscan in February, 2020 and there was near absent dopamine in the bilateral putamen.    ? ?PAIN:  ?Are you having pain? Yes: NPRS scale: 5-6/10 when walking on it, otherwise sitting still is a 0/10.  ?Pain location: R toe ?Pain description: Hervey Ard ?Aggravating factors: Walking ?Relieving factors: If it has the right amount of cushion.  ? ?PRECAUTIONS: Fall ? ?FALLS: Has patient fallen in last 6 months? Yes. Number of falls 6 (averages about 1 a month) ? ?LIVING ENVIRONMENT: ?Lives with: lives alone ?Lives in: House/apartment ?Stairs: has a ramped entrance to his house.  ?Has following equipment at home: Gilford Rile - 2 wheeled, Environmental consultant - 4 wheeled, Wheelchair (manual), shower chair, Grab bars, and U-step walker, pt has a shower chair but reports it is hard to shower, has a regular RW but does not use it often, has it walking stick.  ? ?PLOF: Independent with household mobility with device and Independent with community mobility with device ? ?PATIENT GOALS Wants to learn how to walk again (walk in tight spaces and through doorways). ? ?OBJECTIVE:  ? ? ?  ?SENSATION: ?WFL ? ?COORDINATION: ?Heel to shin, less ROM with RLE.  ? ?EDEMA:  ?R ankle (chronic since he had his surgery on that foot in 2021) ? ? ? ?POSTURE: rounded shoulders, forward head, posterior pelvic tilt, and flexed trunk  ?Cervical dystonia; head tilted to R and  rotated to L  ? ? ? ?MMT:   ? ?MMT Right ?10/10/2021 Left ?10/10/2021  ?Hip flexion 5/5 4+/5  ?Hip extension    ?Hip abduction    ?Hip  adduction    ?Hip internal rotation    ?Hip external rotation    ?Knee flexion 5/5 5/5  ?Knee extension 5/5 5/5  ?Ankle dorsiflexion 5/5 5/5  ?Ankle plantarflexion    ?Ankle inversion    ?Ankle eversion    ?(Blank rows = not tested) ? ?BED MOBILITY:  ?Sleeps in a recliner at home due to his sleep apnea, has not been in a bed in years.  ? ?TRANSFERS: ?Assistive device utilized: None  ?Sit to stand: SBA ?Stand to sit: SBA ?Pt with BLE bracing against the chair, needs to use BUE support from chair, decr forward lean and incr forward flexed posture in standing ? ?GAIT: ?Gait pattern: step through pattern, decreased arm swing- Right, decreased arm swing- Left, decreased step length- Left, decreased stance time- Right, decreased stride length, decreased ankle dorsiflexion- Right, and shuffling ?Distance walked: Clinic distances, plus distance for 3MWT ?Assistive device utilized: None, U-step walker  ?Level of assistance: SBA and CGA ?Comments: Noted incr R toe in during gait. When ambulating with no AD, pt needing CGA especially when pt would turn due to freezing episodes.  ? ?FUNCTIONAL TESTs:  ?3 minute walk test: 62' with u step walker, pre O2: 94% HR: 82 bpm, post O2: 93%. No freezing episodes, pt reporting 0/10 RPE afterwards.   ?10 meter walk test: 13.73 seconds = 2.38 ft/sec with U step walker, limited community ambulator  ?Push and release test (# of steps): Anterior: 3 small steps, posterior 3 small shuffled steps  ? ? [facility] PT Assessment - 10/10/21 0842   ? ?  ? Standardized Balance Assessment  ? Standardized Balance Assessment Timed Up and Go Test   ?  ? Timed Up and Go Test  ? Normal TUG (seconds) 12.78   ? Manual TUG (seconds) 17.19   initiation festination when first standing  ? Cognitive TUG (seconds) 18.09   freezing episode when turning  ? TUG Comments Min guard needed with manual/cog TUG due to freezing episodes. Pt reports he normally freezes when he has something ahead of him in his path (such as when  he walking holding the water cup during the manual TUG)   ? ?  ?  ? ?  ? ? ? ? ?TODAY'S TREATMENT:  ?N/A at eval  ? ? ?PATIENT EDUCATION: ?Education details: Clinical findings, POC, importance of using U-Step Environmental consultant

## 2021-10-10 NOTE — Therapy (Signed)
?OUTPATIENT SPEECH LANGUAGE PATHOLOGY PARKINSON'S EVALUATION ? ? ?Patient Name: Stanley Wright ?MRN: [id number] ?DOB:Sep 10, 1945, 76 y.o., male ?Today's Date: 10/10/2021 ? ?PCP: Scifres, Dorthy, PA-C ?REFERRING PROVIDER: Ludwig Clarks, DO  ? ? End of Session - 10/10/21 4010   ? ? Visit Number 1   ? Number of Visits 17   ? Date for SLP Re-Evaluation 12/05/21   ? Authorization Type [employer]   ? SLP Start Time 0845   ? SLP Stop Time  [phone]   ? SLP Time Calculation (min) 38 min   ? Activity Tolerance Patient tolerated treatment well   ? ?  ?  ? ?  ? ? ?Past Medical History:  ?Diagnosis Date  ? Arthritis   ? BENIGN PROSTATIC HYPERTROPHY, WITH OBSTRUCTION   ? Cancer [facility])   ? skin, basal, squamous  ? COLONIC POLYPS   ? Diverticulitis   ? DVT (deep venous thrombosis) ([place]) 2000  ? Dysrhythmia   ? Hx Afib- 2017  ? Early cataract   ? Factor 5 Leiden mutation, heterozygous ([place])   ? GERD (gastroesophageal reflux disease)   ? not on medication  ? Hearing aid worn   ? B/L  ? HIATAL HERNIA   ? ITP (idiopathic thrombocytopenic purpura) 2003  ? Long term current use of anticoagulant   ? Osteoarthritis   ? right foot  ? Parkinson's disease ([place]) 02/03/2018  ? PSORIASIS   ? Pulmonary embolus ([place]) 2000  ? Shortness of breath dyspnea   ? at times - Talking alot  ? Sleep apnea   ? Wears glasses   ? ?Past Surgical History:  ?Procedure Laterality Date  ? ANKLE FUSION Right 08/24/2019  ? Procedure: RIGHT TALO-NAVICULAR FUSION;  Surgeon: Newt Minion, MD;  Location: [place];  Service: Orthopedics;  Laterality: Right;  ? CARDIOVERSION N/A 11/22/2015  ? Procedure: CARDIOVERSION;  Surgeon: Sanda Klein, MD;  [facility];  Service: Cardiovascular;  Laterality: N/A;  ? COLONOSCOPY    ? HERNIA REPAIR Left   ? Inguinal- x2 . mesh x1  ? INSERTION OF MESH N/A 02/25/2016  ? Procedure: INSERTION OF MESH;  Surgeon: Rolm Bookbinder, MD;  Location: [place];  Service: General;  Laterality: N/A;  ? LUNG REMOVAL, PARTIAL Right 2004  ?  was fungal and not cancerous  ? PROSTATE SURGERY    ? UMBILICAL HERNIA REPAIR N/A 02/25/2016  ? Procedure: LAPAROSCOPIC UMBILICAL HERNIA REPAIR;  Surgeon: Rolm Bookbinder, MD;  Location: [place];  Service: General;  Laterality: N/A;  ? ?Patient Active Problem List  ? Diagnosis Date Noted  ? Primary osteoarthritis, right ankle and foot   ? Lower extremity edema 08/10/2019  ? Acute DVT (deep venous thrombosis) ([place]) 07/19/2019  ? History of pulmonary embolism 07/19/2019  ? Parkinson's disease ([place]) 02/03/2018  ? Community acquired pneumonia of left lower lobe of lung 12/23/2017  ? Sepsis ([place]) 12/23/2017  ? Morbid obesity due to excess calories ([place]) 08/23/2016  ? OSA on CPAP 08/23/2016  ? Dyspnea on exertion 08/22/2016  ? Umbilical hernia 36/64/4034  ? Persistent atrial fibrillation ([place])   ? Encounter for therapeutic drug monitoring 08/12/2013  ? Long term current use of anticoagulant 07/30/2010  ? COLONIC POLYPS 06/03/2010  ? Factor V Leiden ([place]) 06/03/2010  ? GERD 06/03/2010  ? HIATAL HERNIA 06/03/2010  ? BENIGN PROSTATIC HYPERTROPHY, WITH OBSTRUCTION 06/03/2010  ? PSORIASIS 06/03/2010  ? ? ?ONSET DATE: referred 10/01/2021, dx with Parkinsonism Sept 2019 ? ?REFERRING DIAG: G20 (ICD-10-CM) - Primary parkinsonism (  [place])  ? ?THERAPY DIAG:  ?Dysarthria and anarthria ? ?SUBJECTIVE:  ? ?SUBJECTIVE STATEMENT: ?"She wore me out but other than that I'm good." ?Pt accompanied by: self ? ?PERTINENT HISTORY: Dx with Parkinsonism 2019. Symptoms were present for 2 year before, per pt report. 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 Received ST in 2020 and 2019.  ? ?PAIN:  ?Are you having pain? No ? ?FALLS: Has patient fallen in last 6 months?  Yes, had PT eval this date.  ? ?LIVING ENVIRONMENT: ?Lives with: lives alone ?Lives in: House/apartment ? ?PLOF:  ?Level of assistance: Independent with ADLs, Independent with IADLs ?Employment: Retired ? 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 ?OBJECTIVE:  ? ?COGNITION: ?Overall cognitive status: No family/caregiver present to determine baseline cognitive functioning ?Comments: Pt reports no changes in his thinking skills, given numerous examples to consider by SLP.  ? ?MOTOR SPEECH: ?Overall motor speech: impaired ?Level of impairment: Word ?Respiration: thoracic breathing ?Phonation: low vocal intensity ?Resonance: WFL ?Articulation: Appears intact ?Intelligibility: Intelligibility reduced ?Motor planning: Appears intact ?Motor speech errors: aware ?Interfering components:  dx ?Effective technique: increased vocal intensity, pause, and pacing ? ?ORAL MOTOR ASSESSMENT:   ?WFL ? ? ?OBJECTIVE ASSESSMENT: ? ?Measured when a sound level meter was placed 30 cm away from pt's mouth, 2 minutes of moderate to complex conversational speech was reduced today, at average 58 dB (WNL= average 70-72dB) with range of 50-63 dB. Overall speech intelligibility for this listener in a quiet environment was affected, at approximately 85% in a quiet room without extraneous noise. Production of loud /a/ averaged 82 dB (range of 77 to 87) and rare min cues needed for loudness.  ? ?Pt then rated effort level at 6/10 for production of loud /a/ (10=maximal effort). In oral reading task pt was asked to use the same amount of effort as with loud /a/. Loudness average with this increased effort was 64 dB (range of 60 to 71) with usual mod cues for loudness. Pt would benefit from skilled ST in order to improve speech intelligibility and pt's QOL. ? ?Pt does not report difficulty with swallowing warranting further evaluation ? ?PATIENT REPORTED OUTCOME MEASURES (PROM): ?V-RQOL: calculated score of 67.5 ? ? ?TODAY'S TREATMENT:  ?Education on dopamine's role in speech production and how Speak Out! program targets this pathology. Demonstrated and practiced varying components of Speak Out! lessons. Provided pt with information to order  Speak Out! workbook. Education on evaluation results, POC.  ? ? ?PATIENT EDUCATION: ?Education details: see above ?Person educated: Patient ?Education method: Explanation, Demonstration, and Handouts ?Education comprehension: verbalized understanding, returned demonstration, and needs further education ? ? ?HOME EXERCISE PROGRAM: ?Order speak out workbook ? ?GOALS: ?Goals reviewed with patient? Yes ? ?SHORT TERM GOALS: Target date: 11/07/2021 ? ?Pt will complete dysarthria HEP with mod-I over 2 sessions ?Baseline: ?Goal status: INITIAL ? ?2.  Pt will average 85 dB in x5 loud "ah" production with rare min-A over 2 sessions ?Baseline:  ?Goal status: INITIAL ? ?3.  Pt will exceed 72 dB in 80% of oral reading trials with usual mod-A over 2 sessions ?Baseline:  ?Goal status: INITIAL ? ?4.  Pt will meet dB targets for Speak Out! cognitive exercise (72+ dB) in 80% of trials with usual mod-A over 2 sessions  ?Baseline:  ?Goal status: INITIAL ? ? ?LONG TERM GOALS: Target date: 12/05/2021 ? ?Pt will report improved voice related QOL via V-QROL by 10  points (calculated score) by d/c.  ?Baseline: 67.5 calculated score ?Goal status: INITIAL ? ?2.  Pt will average 70+ dB in 10 minute conversational sample with rare min-A over 2 sessions.  ?Baseline:  ?Goal status: INITIAL ? ?3.  Pt will report completion of dysarthria HEP, with perceived benefit, at least 1x per day (recommend BID) over 1 week period.  ?Baseline:  ?Goal status: INITIAL ? ?4.  Pt will average 72+ dB in oral reading trials, paragraph level with rare min-A over 2 sessions ?Baseline:  ?Goal status: INITIAL ? ?5.  Pt will meet dB targets for Speak Out! cognitive exercise (72+ dB) in 80% of trials with rare min-A over 2 sessions  ?Baseline:  ?Goal status: INITIAL ? ?ASSESSMENT: ? ?CLINICAL IMPRESSION: ?Patient is a 76 y.o. 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 Pt reports voice changes since 2017. 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 Has difficulty being heard when background noise is present, being heard on telephone, and ordering in drive-thru. Pt uses telephone a lot to stay connected and voice is becom

## 2021-10-10 NOTE — Patient Instructions (Signed)
SPEAK OUT! is a structured program targeting voice in patient's with Parkinson's. This program was developed by [employer]. It is evidence based and based on principles of motor learning. The nonprofit provides free materials to patients being treated by a Speak Out! certified SLP.   www.parkinsonvoiceproject.org for more information  Learn about Parkinson's webinar: https://parkinsonvoiceproject.org/education-training/monthly-webinar/ -- highly recommend watching this webinar!!  Order your stimulus booklet: [phone] Your provider: Keyauna Graefe SLP, [facility]  This book is free!! They do offer a "pay if forward" option where you can donate to the non-profit organization so they can continue serving the Parkinson's population and providing these valuable resources to patients.    

## 2021-10-16 DIAGNOSIS — M47812 Spondylosis without myelopathy or radiculopathy, cervical region: Secondary | ICD-10-CM | POA: Diagnosis not present

## 2021-10-16 DIAGNOSIS — G44209 Tension-type headache, unspecified, not intractable: Secondary | ICD-10-CM | POA: Diagnosis not present

## 2021-10-16 DIAGNOSIS — M9901 Segmental and somatic dysfunction of cervical region: Secondary | ICD-10-CM | POA: Diagnosis not present

## 2021-10-16 DIAGNOSIS — M542 Cervicalgia: Secondary | ICD-10-CM | POA: Diagnosis not present

## 2021-10-17 NOTE — Therapy (Signed)
?OUTPATIENT SPEECH LANGUAGE PATHOLOGY TREATMENT NOTE ? ? ?Patient Name: Stanley Wright ?MRN: [id number] ?DOB:1945-12-10, 76 y.o., male ?Today's Date: 10/18/2021 ? ?PCP: Scifres, Dorthy, PA-C ?REFERRING PROVIDER: Ludwig Clarks, DO  ? ?END OF SESSION:  ? End of Session - 10/18/21 0831   ? ? Visit Number 2   ? Number of Visits 17   ? Date for SLP Re-Evaluation 12/05/21   ? Authorization Type [employer]   ? SLP Start Time [phone]   ? SLP Stop Time  0930   ? SLP Time Calculation (min) 43 min   ? Activity Tolerance Patient tolerated treatment well   ? ?  ?  ? ?  ? ? ?Past Medical History:  ?Diagnosis Date  ? Arthritis   ? BENIGN PROSTATIC HYPERTROPHY, WITH OBSTRUCTION   ? 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 COLONIC POLYPS   ? Diverticulitis   ? DVT (deep venous thrombosis) ([place]) 2000  ? Dysrhythmia   ? Hx Afib- 2017  ? Early cataract   ? Factor 5 Leiden mutation, heterozygous ([place])   ? GERD (gastroesophageal reflux disease)   ? not on medication  ? Hearing aid worn   ? B/L  ? HIATAL HERNIA   ? ITP (idiopathic thrombocytopenic purpura) 2003  ? Long term current use of anticoagulant   ? Osteoarthritis   ? right foot  ? Parkinson's disease ([place]) 02/03/2018  ? PSORIASIS   ? Pulmonary embolus ([place]) 2000  ? Shortness of breath dyspnea   ? at times - Talking alot  ? Sleep apnea   ? Wears glasses   ? ?Past Surgical History:  ?Procedure Laterality Date  ? ANKLE FUSION Right 08/24/2019  ? Procedure: RIGHT TALO-NAVICULAR FUSION;  Surgeon: Newt Minion, MD;  Location: [place];  Service: Orthopedics;  Laterality: Right;  ? CARDIOVERSION N/A 11/22/2015  ? Procedure: CARDIOVERSION;  Surgeon: Sanda Klein, MD;  [facility];  Service: Cardiovascular;  Laterality: N/A;  ? COLONOSCOPY    ? HERNIA REPAIR Left   ? Inguinal- x2 . mesh x1  ? INSERTION OF MESH N/A 02/25/2016  ? Procedure: INSERTION OF MESH;  Surgeon: Rolm Bookbinder, MD;  Location: [place];  Service: General;  Laterality: N/A;  ? LUNG REMOVAL, PARTIAL Right  2004  ? was fungal and not cancerous  ? PROSTATE SURGERY    ? UMBILICAL HERNIA REPAIR N/A 02/25/2016  ? Procedure: LAPAROSCOPIC UMBILICAL HERNIA REPAIR;  Surgeon: Rolm Bookbinder, MD;  Location: [place];  Service: General;  Laterality: N/A;  ? ?Patient Active Problem List  ? Diagnosis Date Noted  ? Primary osteoarthritis, right ankle and foot   ? Lower extremity edema 08/10/2019  ? Acute DVT (deep venous thrombosis) ([employer]) 07/19/2019  ? History of pulmonary embolism 07/19/2019  ? Parkinson's disease ([place]) 02/03/2018  ? Community acquired pneumonia of left lower lobe of lung 12/23/2017  ? Sepsis ([place]) 12/23/2017  ? Morbid obesity due to excess calories ([place]) 08/23/2016  ? OSA on CPAP 08/23/2016  ? Dyspnea on exertion 08/22/2016  ? Umbilical hernia 41/66/0630  ? Persistent atrial fibrillation ([place])   ? Encounter for therapeutic drug monitoring 08/12/2013  ? Long term current use of anticoagulant 07/30/2010  ? COLONIC POLYPS 06/03/2010  ? Factor V Leiden ([place]) 06/03/2010  ? GERD 06/03/2010  ? HIATAL HERNIA 06/03/2010  ? BENIGN PROSTATIC HYPERTROPHY, WITH OBSTRUCTION 06/03/2010  ? PSORIASIS 06/03/2010  ? ? ?ONSET DATE: referred 10/01/2021, dx with Parkinsonism Sept 2019 ?  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 ?THERAPY DIAG:  ?Dysarthria and anarthria ? ?SUBJECTIVE: "I only have about 15 minutes" ? ?PAIN:  ?Are you having pain? No ?OBJECTIVE:  ? ?TODAY'S TREATMENT:  ?10-17-21: Target improving vocal quality and increasing intensity through progressively difficulty speech tasks using Speak Out! program, lesson 1. ST leads pt through exercises providing usual model prior to pt execution. Usual mod-A required to achieve target dB this date. Averages this date: loud "ah" 90 dB; reading 74dB; cognitive speech task 71 dB. required this date during structured practice.  Conversational sample of approx 5 minutes, in which pt answered simple, personally relevant questions, pt averages 61 dB with usual  max-A.  ?  ?  ?PATIENT EDUCATION: ?Education details: see above ?Person educated: Patient ?Education method: Explanation, Demonstration, and Handouts ?Education comprehension: verbalized understanding, returned demonstration, and needs further education ?  ?  ?HOME EXERCISE PROGRAM: ?Speak Out! Lesson 2 ?  ?GOALS: ?Goals reviewed with patient? Yes ?  ?SHORT TERM GOALS: Target date: 11/07/2021 ?  ?Pt will complete dysarthria HEP with mod-I over 2 sessions ?Baseline: ?Goal status: ongoing ?  ?2.  Pt will average 85 dB in x5 loud "ah" production with rare min-A over 2 sessions ?Baseline:  ?Goal status: ongoing ?  ?3.  Pt will exceed 72 dB in 80% of oral reading trials with usual mod-A over 2 sessions ?Baseline:  ?Goal status: ongoing ?  ?4.  Pt will meet dB targets for Speak Out! cognitive exercise (72+ dB) in 80% of trials with usual mod-A over 2 sessions  ?Baseline:  ?Goal status: ongoing ?  ?  ?LONG TERM GOALS: Target date: 12/05/2021 ?  ?Pt will report improved voice related QOL via V-QROL by 10 points (calculated score) by d/c.  ?Baseline: 67.5 calculated score ?Goal status: ongoing ?  ?2.  Pt will average 70+ dB in 10 minute conversational sample with rare min-A over 2 sessions.  ?Baseline:  ?Goal status: ongoing ?  ?3.  Pt will report completion of dysarthria HEP, with perceived benefit, at least 1x per day (recommend BID) over 1 week period.  ?Baseline:  ?Goal status: ongoing ?  ?4.  Pt will average 72+ dB in oral reading trials, paragraph level with rare min-A over 2 sessions ?Baseline:  ?Goal status: ongoing ?  ?5.  Pt will meet dB targets for Speak Out! cognitive exercise (72+ dB) in 80% of trials with rare min-A over 2 sessions  ?Baseline:  ?Goal status: ongoing ?  ?ASSESSMENT: ?  ?CLINICAL IMPRESSION: ?Patient is a 76 y.o. 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 Pt reports voice changes since 2017. 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 Has  difficulty being heard when background noise is present, being heard on telephone, and ordering in drive-thru. Pt uses telephone a lot to stay connected and voice is become more so a barrier in successfully communicating via this modality. Communication success with familiar speaks is highly dependent on the environment with extraneous sounds negatively impacting pt's ability to be heard. C/o decreased breath support, evidenced throughout today's session. 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 Dynamic assessment during evaluation shows pt is stimulable for increasing intensity through using intentional speech. I recommend skilled ST to address pt's dysarthria, primarily categorized by his low vocal intensity.   ?  ?OBJECTIVE IMPAIRMENTS  ?Objective impairments include dysarthria. These  impairments are limiting patient from effectively communicating at home and in community.Factors affecting potential to achieve goals and functional outcome are medical prognosis.. Patient will benefit from skilled SLP services to address above impairments and improve overall function. ?  ?REHAB POTENTIAL: Good ?  ?PLAN: ?SLP FREQUENCY: 2x/week ?  ?SLP DURATION: 8 weeks ?  ?PLANNED INTERVENTIONS: Cueing hierachy, Internal/external aids, Functional tasks, SLP instruction and feedback, Compensatory strategies, and Patient/family education ? ?Su Monks, CCC-SLP ?10/18/2021, 8:54 AM ?  ?

## 2021-10-18 ENCOUNTER — Ambulatory Visit: Payer: PPO | Attending: Neurology | Admitting: Physical Therapy

## 2021-10-18 ENCOUNTER — Ambulatory Visit: Payer: PPO | Admitting: Speech Pathology

## 2021-10-18 DIAGNOSIS — R2681 Unsteadiness on feet: Secondary | ICD-10-CM | POA: Insufficient documentation

## 2021-10-18 DIAGNOSIS — R2689 Other abnormalities of gait and mobility: Secondary | ICD-10-CM | POA: Diagnosis not present

## 2021-10-18 DIAGNOSIS — R293 Abnormal posture: Secondary | ICD-10-CM | POA: Insufficient documentation

## 2021-10-18 DIAGNOSIS — R471 Dysarthria and anarthria: Secondary | ICD-10-CM | POA: Insufficient documentation

## 2021-10-18 DIAGNOSIS — M6281 Muscle weakness (generalized): Secondary | ICD-10-CM | POA: Insufficient documentation

## 2021-10-18 DIAGNOSIS — R29818 Other symptoms and signs involving the nervous system: Secondary | ICD-10-CM | POA: Diagnosis not present

## 2021-10-18 NOTE — Therapy (Signed)
?OUTPATIENT PHYSICAL THERAPY TREATMENT NOTE ? ? ?Patient Name: Stanley Wright ?MRN: [id number] ?DOB:15-Sep-1945, 76 y.o., male ?Today's Date: 10/18/2021 ? ?PCP: Scifres, Dorothy, PA-C  ?REFERRING PROVIDER: Ludwig Clarks, DO  ? ?END OF SESSION:  ? PT End of Session - 10/18/21 0801   ? ? Visit Number 2   ? Number of Visits 13   ? Date for PT Re-Evaluation 01/08/22   due to delay in scheduling  ? Authorization Type [employer]   ? PT Start Time 0800   ? PT Stop Time 0845   ? PT Time Calculation (min) 45 min   ? Equipment Utilized During Treatment Gait belt   ? Activity Tolerance Patient tolerated treatment well   ? Behavior During Therapy [facility] for tasks assessed/performed   ? ?  ?  ? ?  ? ? ?Past Medical History:  ?Diagnosis Date  ? Arthritis   ? BENIGN PROSTATIC HYPERTROPHY, WITH OBSTRUCTION   ? 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 COLONIC POLYPS   ? Diverticulitis   ? DVT (deep venous thrombosis) ([place]) 2000  ? Dysrhythmia   ? Hx Afib- 2017  ? Early cataract   ? Factor 5 Leiden mutation, heterozygous ([place])   ? GERD (gastroesophageal reflux disease)   ? not on medication  ? Hearing aid worn   ? B/L  ? HIATAL HERNIA   ? ITP (idiopathic thrombocytopenic purpura) 2003  ? Long term current use of anticoagulant   ? Osteoarthritis   ? right foot  ? Parkinson's disease ([place]) 02/03/2018  ? PSORIASIS   ? Pulmonary embolus ([place]) 2000  ? Shortness of breath dyspnea   ? at times - Talking alot  ? Sleep apnea   ? Wears glasses   ? ?Past Surgical History:  ?Procedure Laterality Date  ? ANKLE FUSION Right 08/24/2019  ? Procedure: RIGHT TALO-NAVICULAR FUSION;  Surgeon: Newt Minion, MD;  Location: [place];  Service: Orthopedics;  Laterality: Right;  ? CARDIOVERSION N/A 11/22/2015  ? Procedure: CARDIOVERSION;  Surgeon: Sanda Klein, MD;  [facility];  Service: Cardiovascular;  Laterality: N/A;  ? COLONOSCOPY    ? HERNIA REPAIR Left   ? Inguinal- x2 . mesh x1  ? INSERTION OF MESH N/A 02/25/2016  ? Procedure: INSERTION  OF MESH;  Surgeon: Rolm Bookbinder, MD;  Location: [place];  Service: General;  Laterality: N/A;  ? LUNG REMOVAL, PARTIAL Right 2004  ? was fungal and not cancerous  ? PROSTATE SURGERY    ? UMBILICAL HERNIA REPAIR N/A 02/25/2016  ? Procedure: LAPAROSCOPIC UMBILICAL HERNIA REPAIR;  Surgeon: Rolm Bookbinder, MD;  Location: [place];  Service: General;  Laterality: N/A;  ? ?Patient Active Problem List  ? Diagnosis Date Noted  ? Primary osteoarthritis, right ankle and foot   ? Lower extremity edema 08/10/2019  ? Acute DVT (deep venous thrombosis) ([place]) 07/19/2019  ? History of pulmonary embolism 07/19/2019  ? Parkinson's disease ([place]) 02/03/2018  ? Community acquired pneumonia of left lower lobe of lung 12/23/2017  ? Sepsis ([place]) 12/23/2017  ? Morbid obesity due to excess calories ([place]) 08/23/2016  ? OSA on CPAP 08/23/2016  ? Dyspnea on exertion 08/22/2016  ? Umbilical hernia 40/98/1191  ? Persistent atrial fibrillation ([place])   ? Encounter for therapeutic drug monitoring 08/12/2013  ? Long term current use of anticoagulant 07/30/2010  ? COLONIC POLYPS 06/03/2010  ? Factor V Leiden ([place]) 06/03/2010  ? GERD 06/03/2010  ? HIATAL HERNIA 06/03/2010  ? BENIGN  PROSTATIC HYPERTROPHY, WITH OBSTRUCTION 06/03/2010  ? PSORIASIS 06/03/2010  ? ? 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 ?THERAPY DIAG:  ?Other abnormalities of gait and mobility ? ?Unsteadiness on feet ? ?Muscle weakness (generalized) ? 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   ? ?PRECAUTIONS: Fall ? ?SUBJECTIVE: "well I am better than having a sharp stick in my eye".  ? ?PAIN:  ?Are you having pain? Yes: NPRS scale: 5/10 ?Pain location: R pinky toe ?Pain description: Achy w/weightbearing   ? ? ?OBJECTIVE: (objective measures completed at initial evaluation unless otherwise dated) ?  ?  ?TODAY'S TREATMENT:  ?Pt walked into clinic w/U-step walker and walking stick propped on it. Stood and completed an email on his phone for 5 minutes at beginning of session. Pt reported it was about his eye and was urgent. Obtained subjective during this time and discussed plan for session.  ? ?Ther Act  ? [facility] PT Assessment - 10/18/21 0807   ? ?  ? Balance  ? Balance Assessed Yes   ?  ? Standardized Balance Assessment  ? Standardized Balance Assessment Mini-BESTest   ?  ? Mini-BESTest  ? Sit To Stand Moderate: Comes to stand WITH use of hands on first attempt.   ? Rise to Toes Moderate: Heels up, but not full range (smaller than when holding hands), OR noticeable instability for 3 s.   ? Stand on one leg (left) Moderate: < 20 s   ~1s  ? Stand on one leg (right) Moderate: < 20 s   ~1s  ? Stand on one leg - lowest score 1   ? Compensatory Stepping Correction - Forward Normal: Recovers independently with a single, large step (second realignement is allowed).   ? Compensatory Stepping Correction - Backward Moderate: More than one step is required to recover equilibrium   ? Compensatory Stepping Correction - Left Lateral Severe: Falls, or cannot step   ? Compensatory Stepping Correction - Right Lateral Severe:  Falls, or cannot step   Pt unable to perform on R side  ? Stepping Corredtion Lateral - lowest score 0   ? Stance - Feet together, eyes open, firm surface  Normal: 30s   ? Stance - Feet together, eyes closed, foam surface  Normal: 30s   ? Incline - Eyes Closed Normal: Stands independently 30s and aligns with gravity   ? Change in Gait Speed Normal: Significantly changes walkling speed without imbalance   ? Walk with head turns - Horizontal Moderate: performs head turns with reduction in gait speed.   ? Walk with pivot turns Severe: Cannot turn with feet close at any speed without imbalance.   Significant freezing  requriing min A for balance  ? Step over obstacles Moderate: Steps over box but touches box OR displays cautious behavior by slowing gait.   ? Timed UP & GO with Dual Task Moderate: Dual Task affects either counting OR walking (>10%) when compared to the TUG without Dual Task.   ? Mini-BEST total score 17   ? ?  ?  ? ?  ? ?NMR  ?Established and demonstrated initial HEP for improved lateral weight shifting, turns practice and posture.  ? ? ?- Sit to Stand Without Arm Support  - 1 x daily -  7 x weekly - 3 sets - 10 reps. Cued pt to get "nose over toes". Pt required multiple attempts to perform.  ? ?- Proper Sit to Stand Technique  - 1 x daily - 7 x weekly - 3 sets - 10 reps. Provided as a back-up in case pt unable to perform without UE support  ? ?- Seated Shoulder Diagonal Pulls with Resistance  - 1 x daily - 7 x weekly - 3 sets - 10 reps. W/red theraband  ? ?- Lateral Weight Shift with Arm Raise  - 1 x daily - 7 x weekly - 3 sets - 10 reps. Cues for increased amplitude and step length. Pt unable to increase step length/amplitude without concurrent verbal cues  ? ?- Standing Quarter Turn with Counter Support  - 1 x daily - 7 x weekly - 3 sets - 10 reps. Cues to lead w/foot on same side as turn (LLE when turning to L side) and to stop and reset w/freezing episode. Cued pt to shift to L and R when freezing occurred and to take a large step.  ?  ?  ?PATIENT EDUCATION: ?Education details: MiniBest results, initial HEP, freezing strategies  ?Person educated: Patient ?Education method: Explanation and handout ?Education comprehension: verbalized understanding and needs further education  ?  ?  ?HOME EXERCISE PROGRAM: ?Access Code: [email or web address] ?URL: [email or web address] ?Date: 10/18/2021 ?Prepared by: Mickie Bail Rheanna Sergent ? ?Exercises ?- Sit to Stand Without Arm Support  - 1 x daily - 7 x weekly - 3 sets - 10 reps ?- Proper Sit to Stand Technique  - 1 x daily - 7 x weekly - 3 sets - 10 reps ?- Seated Shoulder Diagonal  Pulls with Resistance  - 1 x daily - 7 x weekly - 3 sets - 10 reps ?- Lateral Weight Shift with Arm Raise  - 1 x daily - 7 x weekly - 3 sets - 10 reps ?- Standing Quarter Turn with Counter Support  - 1 x daily -

## 2021-10-21 ENCOUNTER — Ambulatory Visit (INDEPENDENT_AMBULATORY_CARE_PROVIDER_SITE_OTHER): Payer: PPO | Admitting: *Deleted

## 2021-10-21 DIAGNOSIS — D6851 Activated protein C resistance: Secondary | ICD-10-CM | POA: Diagnosis not present

## 2021-10-21 DIAGNOSIS — Z5181 Encounter for therapeutic drug level monitoring: Secondary | ICD-10-CM | POA: Diagnosis not present

## 2021-10-21 DIAGNOSIS — I4819 Other persistent atrial fibrillation: Secondary | ICD-10-CM | POA: Diagnosis not present

## 2021-10-21 LAB — POCT INR: INR: 1.4 — AB (ref 2.0–3.0)

## 2021-10-21 NOTE — Patient Instructions (Signed)
Description   ?Today take 2 tablets and 1.5 tablets tomorrow then continue taking 1 tablet daily except for 1.5 tablets on Mondays and Fridays. Recheck INR in 1 week. [facility] [phone] ?  ?  ?

## 2021-10-22 ENCOUNTER — Ambulatory Visit: Payer: PPO | Admitting: Speech Pathology

## 2021-10-22 DIAGNOSIS — R2689 Other abnormalities of gait and mobility: Secondary | ICD-10-CM | POA: Diagnosis not present

## 2021-10-22 DIAGNOSIS — R471 Dysarthria and anarthria: Secondary | ICD-10-CM

## 2021-10-22 NOTE — Therapy (Signed)
?OUTPATIENT SPEECH LANGUAGE PATHOLOGY TREATMENT NOTE ? ? ?Patient Name: Stanley Wright ?MRN: [id number] ?DOB:1945-10-27, 76 y.o., male ?Today's Date: 10/22/2021 ? ?PCP: Scifres, Dorthy, PA-C ?REFERRING PROVIDER: Ludwig Clarks, DO  ? ?END OF SESSION:  ? End of Session - 10/22/21 0801   ? ? Visit Number 3   ? Number of Visits 17   ? Date for SLP Re-Evaluation 12/05/21   ? Authorization Type [employer]   ? SLP Start Time 0801   ? SLP Stop Time  0845   ? SLP Time Calculation (min) 44 min   ? Activity Tolerance Patient tolerated treatment well   ? ?  ?  ? ?  ? ? ?Past Medical History:  ?Diagnosis Date  ? Arthritis   ? BENIGN PROSTATIC HYPERTROPHY, WITH OBSTRUCTION   ? Cancer [facility])   ? skin, basal, squamous  ? COLONIC POLYPS   ? Diverticulitis   ? DVT (deep venous thrombosis) ([place]) 2000  ? Dysrhythmia   ? Hx Afib- 2017  ? Early cataract   ? Factor 5 Leiden mutation, heterozygous ([place])   ? GERD (gastroesophageal reflux disease)   ? not on medication  ? Hearing aid worn   ? B/L  ? HIATAL HERNIA   ? ITP (idiopathic thrombocytopenic purpura) 2003  ? Long term current use of anticoagulant   ? Osteoarthritis   ? right foot  ? Parkinson's disease ([place]) 02/03/2018  ? PSORIASIS   ? Pulmonary embolus ([place]) 2000  ? Shortness of breath dyspnea   ? at times - Talking alot  ? Sleep apnea   ? Wears glasses   ? ?Past Surgical History:  ?Procedure Laterality Date  ? ANKLE FUSION Right 08/24/2019  ? Procedure: RIGHT TALO-NAVICULAR FUSION;  Surgeon: Newt Minion, MD;  Location: [place];  Service: Orthopedics;  Laterality: Right;  ? CARDIOVERSION N/A 11/22/2015  ? Procedure: CARDIOVERSION;  Surgeon: Sanda Klein, MD;  [facility];  Service: Cardiovascular;  Laterality: N/A;  ? COLONOSCOPY    ? HERNIA REPAIR Left   ? Inguinal- x2 . mesh x1  ? INSERTION OF MESH N/A 02/25/2016  ? Procedure: INSERTION OF MESH;  Surgeon: Rolm Bookbinder, MD;  Location: [place];  Service: General;  Laterality: N/A;  ? LUNG REMOVAL, PARTIAL Right  2004  ? was fungal and not cancerous  ? PROSTATE SURGERY    ? UMBILICAL HERNIA REPAIR N/A 02/25/2016  ? Procedure: LAPAROSCOPIC UMBILICAL HERNIA REPAIR;  Surgeon: Rolm Bookbinder, MD;  Location: [place];  Service: General;  Laterality: N/A;  ? ?Patient Active Problem List  ? Diagnosis Date Noted  ? Primary osteoarthritis, right ankle and foot   ? Lower extremity edema 08/10/2019  ? Acute DVT (deep venous thrombosis) ([place]) 07/19/2019  ? History of pulmonary embolism 07/19/2019  ? Parkinson's disease ([place]) 02/03/2018  ? Community acquired pneumonia of left lower lobe of lung 12/23/2017  ? Sepsis ([place]) 12/23/2017  ? Morbid obesity due to excess calories ([place]) 08/23/2016  ? OSA on CPAP 08/23/2016  ? Dyspnea on exertion 08/22/2016  ? Umbilical hernia 70/62/3762  ? Persistent atrial fibrillation ([place])   ? Encounter for therapeutic drug monitoring 08/12/2013  ? Long term current use of anticoagulant 07/30/2010  ? COLONIC POLYPS 06/03/2010  ? Factor V Leiden ([place]) 06/03/2010  ? GERD 06/03/2010  ? HIATAL HERNIA 06/03/2010  ? BENIGN PROSTATIC HYPERTROPHY, WITH OBSTRUCTION 06/03/2010  ? PSORIASIS 06/03/2010  ? ? ?ONSET DATE: referred 10/01/2021, dx with Parkinsonism Sept 2019 ?  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 ?THERAPY DIAG:  ?Dysarthria and anarthria ? ?SUBJECTIVE: "no so terrible" ? ?PAIN:  ?Are you having pain? Yes ?Description: 5-6/10 when walking, R toe ? ?OBJECTIVE:  ? ?TODAY'S TREATMENT:  ?10-22-21: Led pt through [place] 3. With occasional min-A, averages following dB levels: ?Loud ah: 91 dB ?Counting: 87 dB ?Reading (phrases): 76 dB ?Cognitive Ex: 74 dB ?During conversational exchange, average intensity drops into mid 60s, requiring usual mod-A to increase volume. Usual increased rate of speech, resulting in pt speaking on residual air. Overall decreased intelligibility from structured practice. Education on breath support and pacing for increased communication success. Practiced  pacing in pt ordering meal he plans to get following therapy session.  ? ?10-17-21: Target improving vocal quality and increasing intensity through progressively difficulty speech tasks using Speak Out! program, lesson 1. ST leads pt through exercises providing usual model prior to pt execution. Usual mod-A required to achieve target dB this date. Averages this date: loud "ah" 90 dB; reading 74dB; cognitive speech task 71 dB. required this date during structured practice.  Conversational sample of approx 5 minutes, in which pt answered simple, personally relevant questions, pt averages 61 dB with usual max-A.  ?  ?  ?PATIENT EDUCATION: ?Education details: see above ?Person educated: Patient ?Education method: Explanation, Demonstration, and Handouts ?Education comprehension: verbalized understanding, returned demonstration, and needs further education ?  ?  ?HOME EXERCISE PROGRAM: ?Speak Out! Lesson 3, 4, 5 ?  ?GOALS: ?Goals reviewed with patient? Yes ?  ?SHORT TERM GOALS: Target date: 11/07/2021 ?  ?Pt will complete dysarthria HEP with mod-I over 2 sessions ?Baseline: ?Goal status: ongoing ?  ?2.  Pt will average 85 dB in x5 loud "ah" production with rare min-A over 2 sessions ?Baseline:  ?Goal status: ongoing ?  ?3.  Pt will exceed 72 dB in 80% of oral reading trials with usual mod-A over 2 sessions ?Baseline:  ?Goal status: ongoing ?  ?4.  Pt will meet dB targets for Speak Out! cognitive exercise (72+ dB) in 80% of trials with usual mod-A over 2 sessions  ?Baseline:  ?Goal status: ongoing ?  ?  ?LONG TERM GOALS: Target date: 12/05/2021 ?  ?Pt will report improved voice related QOL via V-QROL by 10 points (calculated score) by d/c.  ?Baseline: 67.5 calculated score ?Goal status: ongoing ?  ?2.  Pt will average 70+ dB in 10 minute conversational sample with rare min-A over 2 sessions.  ?Baseline:  ?Goal status: ongoing ?  ?3.  Pt will report completion of dysarthria HEP, with perceived benefit, at least 1x per day  (recommend BID) over 1 week period.  ?Baseline:  ?Goal status: ongoing ?  ?4.  Pt will average 72+ dB in oral reading trials, paragraph level with rare min-A over 2 sessions ?Baseline:  ?Goal status: ongoing ?  ?5.  Pt will meet dB targets for Speak Out! cognitive exercise (72+ dB) in 80% of trials with rare min-A over 2 sessions  ?Baseline:  ?Goal status: ongoing ?  ?ASSESSMENT: ?  ?CLINICAL IMPRESSION: ?Patient is a 76 y.o. 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 Pt reports voice changes since 2017. 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 Has difficulty being heard when background noise is present, being heard on telephone, and ordering in drive-thru. Pt uses telephone a lot to stay connected and voice is become more so a barrier in successfully communicating via this  modality. Communication success with familiar speaks is highly dependent on the environment with extraneous sounds negatively impacting pt's ability to be heard. C/o decreased breath support, evidenced throughout today's session. 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 Dynamic assessment during evaluation shows pt is stimulable for increasing intensity through using intentional speech. I recommend skilled ST to address pt's dysarthria, primarily categorized by his low vocal intensity.   ?  ?OBJECTIVE IMPAIRMENTS  ?Objective impairments include dysarthria. These impairments are limiting patient from effectively communicating at home and in community.Factors affecting potential to achieve goals and functional outcome are medical prognosis.. Patient will benefit from skilled SLP services to address above impairments and improve overall function. ?  ?REHAB POTENTIAL: Good ?  ?PLAN: ?SLP FREQUENCY: 2x/week ?  ?SLP DURATION: 8 weeks ?  ?PLANNED INTERVENTIONS: Cueing hierachy, Internal/external  aids, Functional tasks, SLP instruction and feedback, Compensatory strategies, and Patient/family education ? ?Su Monks, CCC-SLP ?10/22/2021, 8:02 AM ?  ?

## 2021-10-23 DIAGNOSIS — M542 Cervicalgia: Secondary | ICD-10-CM | POA: Diagnosis not present

## 2021-10-23 DIAGNOSIS — G44209 Tension-type headache, unspecified, not intractable: Secondary | ICD-10-CM | POA: Diagnosis not present

## 2021-10-23 DIAGNOSIS — M9901 Segmental and somatic dysfunction of cervical region: Secondary | ICD-10-CM | POA: Diagnosis not present

## 2021-10-23 DIAGNOSIS — M47812 Spondylosis without myelopathy or radiculopathy, cervical region: Secondary | ICD-10-CM | POA: Diagnosis not present

## 2021-10-25 ENCOUNTER — Ambulatory Visit: Payer: PPO | Admitting: Speech Pathology

## 2021-10-25 ENCOUNTER — Ambulatory Visit: Payer: PPO | Admitting: Physical Therapy

## 2021-10-25 DIAGNOSIS — M6281 Muscle weakness (generalized): Secondary | ICD-10-CM

## 2021-10-25 DIAGNOSIS — R2689 Other abnormalities of gait and mobility: Secondary | ICD-10-CM

## 2021-10-25 DIAGNOSIS — R2681 Unsteadiness on feet: Secondary | ICD-10-CM

## 2021-10-25 DIAGNOSIS — R471 Dysarthria and anarthria: Secondary | ICD-10-CM

## 2021-10-25 NOTE — Therapy (Signed)
?OUTPATIENT PHYSICAL THERAPY TREATMENT NOTE ? ? ?Patient Name: Stanley Wright ?MRN: [id number] ?DOB:1946-03-16, 76 y.o., male ?Today's Date: 10/25/2021 ? ?PCP: Scifres, Dorothy, PA-C  ?REFERRING PROVIDER: Ludwig Clarks, DO  ? ?END OF SESSION:  ? PT End of Session - 10/25/21 0803   ? ? Visit Number 3   ? Number of Visits 13   ? Date for PT Re-Evaluation 01/08/22   due to delay in scheduling  ? Authorization Type [employer]   ? PT Start Time 0801   ? PT Stop Time 0841   ? PT Time Calculation (min) 40 min   ? Equipment Utilized During Treatment --   ? Activity Tolerance Patient tolerated treatment well   ? Behavior During Therapy [facility] for tasks assessed/performed   ? ?  ?  ? ?  ? ? ? ?Past Medical History:  ?Diagnosis Date  ? Arthritis   ? BENIGN PROSTATIC HYPERTROPHY, WITH OBSTRUCTION   ? 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 COLONIC POLYPS   ? Diverticulitis   ? DVT (deep venous thrombosis) ([place]) 2000  ? Dysrhythmia   ? Hx Afib- 2017  ? Early cataract   ? Factor 5 Leiden mutation, heterozygous ([place])   ? GERD (gastroesophageal reflux disease)   ? not on medication  ? Hearing aid worn   ? B/L  ? HIATAL HERNIA   ? ITP (idiopathic thrombocytopenic purpura) 2003  ? Long term current use of anticoagulant   ? Osteoarthritis   ? right foot  ? Parkinson's disease ([place]) 02/03/2018  ? PSORIASIS   ? Pulmonary embolus ([place]) 2000  ? Shortness of breath dyspnea   ? at times - Talking alot  ? Sleep apnea   ? Wears glasses   ? ?Past Surgical History:  ?Procedure Laterality Date  ? ANKLE FUSION Right 08/24/2019  ? Procedure: RIGHT TALO-NAVICULAR FUSION;  Surgeon: Newt Minion, MD;  Location: [place];  Service: Orthopedics;  Laterality: Right;  ? CARDIOVERSION N/A 11/22/2015  ? Procedure: CARDIOVERSION;  Surgeon: Sanda Klein, MD;  [facility];  Service: Cardiovascular;  Laterality: N/A;  ? COLONOSCOPY    ? HERNIA REPAIR Left   ? Inguinal- x2 . mesh x1  ? INSERTION OF MESH N/A 02/25/2016  ? Procedure: INSERTION OF  MESH;  Surgeon: Rolm Bookbinder, MD;  Location: [place];  Service: General;  Laterality: N/A;  ? LUNG REMOVAL, PARTIAL Right 2004  ? was fungal and not cancerous  ? PROSTATE SURGERY    ? UMBILICAL HERNIA REPAIR N/A 02/25/2016  ? Procedure: LAPAROSCOPIC UMBILICAL HERNIA REPAIR;  Surgeon: Rolm Bookbinder, MD;  Location: [place];  Service: General;  Laterality: N/A;  ? ?Patient Active Problem List  ? Diagnosis Date Noted  ? Primary osteoarthritis, right ankle and foot   ? Lower extremity edema 08/10/2019  ? Acute DVT (deep venous thrombosis) ([place]) 07/19/2019  ? History of pulmonary embolism 07/19/2019  ? Parkinson's disease ([place]) 02/03/2018  ? Community acquired pneumonia of left lower lobe of lung 12/23/2017  ? Sepsis ([place]) 12/23/2017  ? Morbid obesity due to excess calories ([place]) 08/23/2016  ? OSA on CPAP 08/23/2016  ? Dyspnea on exertion 08/22/2016  ? Umbilical hernia 45/85/9292  ? Persistent atrial fibrillation ([place])   ? Encounter for therapeutic drug monitoring 08/12/2013  ? Long term current use of anticoagulant 07/30/2010  ? COLONIC POLYPS 06/03/2010  ? Factor V Leiden ([place]) 06/03/2010  ? GERD 06/03/2010  ? HIATAL HERNIA 06/03/2010  ? BENIGN  PROSTATIC HYPERTROPHY, WITH OBSTRUCTION 06/03/2010  ? PSORIASIS 06/03/2010  ? ? ?REFERRING DIAG: G20 (ICD-10-CM) - Primary parkinsonism ([place])   ? ?THERAPY DIAG:  ?Other abnormalities of gait and mobility ? ?Unsteadiness on feet ? ?Muscle weakness (generalized) ? 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   ? ?PRECAUTIONS: Fall ? ?SUBJECTIVE: Pt reports he was unable to wear his normal shoes today due to R foot pain. Wearing crocs to session. Has performed HEP x2  ? ?PAIN:  ?Are you having pain? Yes: NPRS scale: 6/10 ?Pain location:  R pinky toe ?Pain description: Achy w/weightbearing  ? ? ?OBJECTIVE: (objective measures completed at initial evaluation unless otherwise dated) ?  ?  ?TODAY'S TREATMENT:  ?Self-care/home management  ?Removed pt's R sock and examined foot. No open wounds noted on dorsal aspect of foot but skin very dry and flaky w/ecchymosis noted across metatarsal heads. Max A to don/doff compression sock. Pt reports wearing compression socks 24/7. Encouraged pt to contact Dr. Sharol Given if pain persists, pt stated he would "think about it"  ? ?Ther Ex  ?Scifit multi-peaks level 2.1 using BUE/BLEs for neural priming for reciprocal movement, dynamic cardiovascular warmup and endurance. Noted decreased amplitude of movement on R side compared to L.  ? ?NMR  ?In // bars for improved anticipatory stepping, LE coordination, step clearance and single leg stability: ?-Fwd step over 4 six-inch hurdles using step-to pattern x1 down and back. Ended activity due to pt's shoes falling off feet throughout, causing pt to trip over hurdles.  ? ?Mass practice of proper sit<>stand technique from low mat using NDT principles for anterior weight shift and "nose over toes", x5 reps. Max cues for improved eccentric control with stand <>sit as pt falls posteriorly onto mat. Pt unable to control last 40% of stand <>sit and relies heavily on bracing posterior legs against mat to control lower. Progressed to having pt hold 5# DB in unilateral hand in front rack position, x5 per side, for added posterior lean bias. Pt able to perform stand without UE support but continued to brace against mat to control sit.  ?  ?  ?PATIENT EDUCATION: ?Education details: Continue HEP, contacting Dr. Sharol Given regarding R foot pain  ?Person educated: Patient ?Education method: Explanation and handout ?Education comprehension: verbalized understanding and needs further education  ?  ?  ?HOME EXERCISE PROGRAM: ?Access Code: [email or web address] ?URL: [email or web address] ?Date:  10/18/2021 ?Prepared by: Mickie Bail Briana Newman ? ?Exercises ?- Sit to Stand Without Arm Support  - 1 x daily - 7 x weekly - 3 sets - 10 reps ?- Proper Sit to Stand Technique  - 1 x daily - 7 x weekly - 3 sets - 10 reps ?- Seated Shoulder Diagonal Pulls with Resistance  - 1 x daily - 7 x weekly - 3 sets - 10 reps ?- Lateral Weight Shift with Arm Raise  - 1 x daily - 7 x weekly - 3 sets - 10 reps ?- Standing Quarter Turn with Counter Support  - 1 x daily - 7 x weekly - 3 sets - 10 reps ?  ?  ?  ?GOALS: ?Goals reviewed with patient? Yes ?  ?SHORT TERM GOALS: Target date: 11/07/2021 ?  ?Pt will be independent with initial HEP for improved flexibility, strength, balance, transfers, and gait.   ?  ?  Baseline: ?Goal status: INITIAL ?  ?2.  Pt will verbalize/demo understanding of tips to reduce festination with turns and gait.  ?Baseline:  ?Goal status: INITIAL ?  ?3.  Pt will perform 10 reps of sit <>stands with supervision and BUE support without episodes of BLE  bracing in order to demo improved safety w/ transfers ?Baseline:  ?Goal status: INITIAL ?  ?4.  Pt will undergo further assessment of miniBEST w/ LTG written. ?Baseline: 17/28 ?Goal status: MET ?  ?5.  Pt will decr manual TUG to 15 seconds or less for decr fall risk.  ?Baseline: 17.19 seconds, festination w/ initiation of gait  ?Goal status: INITIAL ?  ?  ?LONG TERM GOALS: Target date: 12/05/2021 ?  ?Pt will be independent with final HEP for improved flexibility, strength, balance, transfers, and gait.   ?Baseline:  ?Goal status: INITIAL ?  ?2.  Pt will improve MiniBest to 22/28 for decreased fall risk and improvement with compensatory stepping strategies.  ? ?Baseline: 17/28 ?Goal status: INITIAL ?  ?3.  Pt will decr cog TUG to 16 seconds or less for decr fall risk.  ?Baseline: 18.09 seconds, freezing with turns  ?Goal status: INITIAL ?  ?4.  Pt will improve gait speed with U-Step Walker to at least 2.7 ft/sec in order to demo improved community mobility.  ?  ?Baseline:  2.38 ft/sec with Henrene Dodge ?Goal status: INITIAL ?  ?  ?ASSESSMENT: ?  ?CLINICAL IMPRESSION: ?Emphasis of skilled PT session on BLE coordination, endurance and transfers Pt reported increase in swelling

## 2021-10-25 NOTE — Therapy (Signed)
?OUTPATIENT SPEECH LANGUAGE PATHOLOGY TREATMENT NOTE ? ? ?Patient Name: Stanley Wright ?MRN: [id number] ?DOB:27-Dec-1945, 76 y.o., male ?Today's Date: 10/25/2021 ? ?PCP: Scifres, Dorthy, PA-C ?REFERRING PROVIDER: Ludwig Clarks, DO  ? ?END OF SESSION:  ? End of Session - 10/25/21 0847   ? ? Visit Number [id number]   ? Number of Visits 17   ? Date for SLP Re-Evaluation 12/05/21   ? Authorization Type [employer]   ? SLP Start Time 0845   ? SLP Stop Time  0924   ? SLP Time Calculation (min) 39 min   ? Activity Tolerance Patient tolerated treatment well   ? ?  ?  ? ?  ? ? ?Past Medical History:  ?Diagnosis Date  ? Arthritis   ? BENIGN PROSTATIC HYPERTROPHY, WITH OBSTRUCTION   ? Cancer [facility])   ? skin, basal, squamous  ? COLONIC POLYPS   ? Diverticulitis   ? DVT (deep venous thrombosis) ([place]) 2000  ? Dysrhythmia   ? Hx Afib- 2017  ? Early cataract   ? Factor 5 Leiden mutation, heterozygous ([place])   ? GERD (gastroesophageal reflux disease)   ? not on medication  ? Hearing aid worn   ? B/L  ? HIATAL HERNIA   ? ITP (idiopathic thrombocytopenic purpura) 2003  ? Long term current use of anticoagulant   ? Osteoarthritis   ? right foot  ? Parkinson's disease ([place]) 02/03/2018  ? PSORIASIS   ? Pulmonary embolus ([place]) 2000  ? Shortness of breath dyspnea   ? at times - Talking alot  ? Sleep apnea   ? Wears glasses   ? ?Past Surgical History:  ?Procedure Laterality Date  ? ANKLE FUSION Right 08/24/2019  ? Procedure: RIGHT TALO-NAVICULAR FUSION;  Surgeon: Newt Minion, MD;  Location: [place];  Service: Orthopedics;  Laterality: Right;  ? CARDIOVERSION N/A 11/22/2015  ? Procedure: CARDIOVERSION;  Surgeon: Sanda Klein, MD;  [facility];  Service: Cardiovascular;  Laterality: N/A;  ? COLONOSCOPY    ? HERNIA REPAIR Left   ? Inguinal- x2 . mesh x1  ? INSERTION OF MESH N/A 02/25/2016  ? Procedure: INSERTION OF MESH;  Surgeon: Rolm Bookbinder, MD;  Location: [place];  Service: General;  Laterality: N/A;  ? LUNG REMOVAL, PARTIAL Right  2004  ? was fungal and not cancerous  ? PROSTATE SURGERY    ? UMBILICAL HERNIA REPAIR N/A 02/25/2016  ? Procedure: LAPAROSCOPIC UMBILICAL HERNIA REPAIR;  Surgeon: Rolm Bookbinder, MD;  Location: [place];  Service: General;  Laterality: N/A;  ? ?Patient Active Problem List  ? Diagnosis Date Noted  ? Primary osteoarthritis, right ankle and foot   ? Lower extremity edema 08/10/2019  ? Acute DVT (deep venous thrombosis) ([place]) 07/19/2019  ? History of pulmonary embolism 07/19/2019  ? Parkinson's disease ([place]) 02/03/2018  ? Community acquired pneumonia of left lower lobe of lung 12/23/2017  ? Sepsis ([place]) 12/23/2017  ? Morbid obesity due to excess calories ([place]) 08/23/2016  ? OSA on CPAP 08/23/2016  ? Dyspnea on exertion 08/22/2016  ? Umbilical hernia 40/98/1191  ? Persistent atrial fibrillation ([place])   ? Encounter for therapeutic drug monitoring 08/12/2013  ? Long term current use of anticoagulant 07/30/2010  ? COLONIC POLYPS 06/03/2010  ? Factor V Leiden ([place]) 06/03/2010  ? GERD 06/03/2010  ? HIATAL HERNIA 06/03/2010  ? BENIGN PROSTATIC HYPERTROPHY, WITH OBSTRUCTION 06/03/2010  ? PSORIASIS 06/03/2010  ? ? ?ONSET DATE: referred 10/01/2021, dx with Parkinsonism Sept 2019 ?  ?REFERRING DIAG:  G20 (ICD-10-CM) - Primary parkinsonism ([place])  ? ?THERAPY DIAG:  ?Dysarthria and anarthria ? ?SUBJECTIVE: "it went good" re: ordering food following last session ? ?PAIN:  ?Are you having pain? Yes ?Description: 4/10; R toe ? ?OBJECTIVE:  ? ?TODAY'S TREATMENT:  ?10-25-21: Pt returns reporting that he has completed HEP x1/day. ST provides additional education on working through new lesson every day as pt repeating previously addressed lessons. Led pt through [employer] 4. Rare min-A for warm up exercise, increasing to usual mod-A for reading and cognitive exercises re: intensity and pacing. Increasing self-awareness of losing intensity during structured practice. Average loudness this date: ?Loud ah: 89 dB ?Counting: 78 dB ?Reading  (phrases): 74 dB ?Cognitive Ex: 68 dB ?Conversational sample, answering ?: 64 dB, usual Max-A and recasting of utterances  ? ?10-22-21: Led pt through [place] 3. With occasional min-A, averages following dB levels: ?Loud ah: 91 dB ?Counting: 87 dB ?Reading (phrases): 76 dB ?Cognitive Ex: 74 dB ?During conversational exchange, average intensity drops into mid 60s, requiring usual mod-A to increase volume. Usual increased rate of speech, resulting in pt speaking on residual air. Overall decreased intelligibility from structured practice. Education on breath support and pacing for increased communication success. Practiced pacing in pt ordering meal he plans to get following therapy session.  ? ?10-17-21: Target improving vocal quality and increasing intensity through progressively difficulty speech tasks using Speak Out! program, lesson 1. ST leads pt through exercises providing usual model prior to pt execution. Usual mod-A required to achieve target dB this date. Averages this date: loud "ah" 90 dB; reading 74dB; cognitive speech task 71 dB. required this date during structured practice.  Conversational sample of approx 5 minutes, in which pt answered simple, personally relevant questions, pt averages 61 dB with usual max-A.  ?  ?  ?PATIENT EDUCATION: ?Education details: see above ?Person educated: Patient ?Education method: Explanation, Demonstration, and Handouts ?Education comprehension: verbalized understanding, returned demonstration, and needs further education ?  ?  ?HOME EXERCISE PROGRAM: ?Speak Out! Lesson 5 5 ?  ?GOALS: ?Goals reviewed with patient? Yes ?  ?SHORT TERM GOALS: Target date: 11/07/2021 ?  ?Pt will complete dysarthria HEP with mod-I over 2 sessions ?Baseline: ?Goal status: ongoing ?  ?2.  Pt will average 85 dB in x5 loud "ah" production with rare min-A over 2 sessions ?Baseline:  ?Goal status: ongoing ?  ?3.  Pt will exceed 72 dB in 80% of oral reading trials with usual mod-A over 2  sessions ?Baseline:  ?Goal status: ongoing ?  ?4.  Pt will meet dB targets for Speak Out! cognitive exercise (72+ dB) in 80% of trials with usual mod-A over 2 sessions  ?Baseline:  ?Goal status: ongoing ?  ?  ?LONG TERM GOALS: Target date: 12/05/2021 ?  ?Pt will report improved voice related QOL via V-QROL by 10 points (calculated score) by d/c.  ?Baseline: 67.5 calculated score ?Goal status: ongoing ?  ?2.  Pt will average 70+ dB in 10 minute conversational sample with rare min-A over 2 sessions.  ?Baseline:  ?Goal status: ongoing ?  ?3.  Pt will report completion of dysarthria HEP, with perceived benefit, at least 1x per day (recommend BID) over 1 week period.  ?Baseline:  ?Goal status: ongoing ?  ?4.  Pt will average 72+ dB in oral reading trials, paragraph level with rare min-A over 2 sessions ?Baseline:  ?Goal status: ongoing ?  ?5.  Pt will meet dB targets for Speak Out! cognitive exercise (  72+ dB) in 80% of trials with rare min-A over 2 sessions  ?Baseline:  ?Goal status: ongoing ?  ?ASSESSMENT: ?  ?CLINICAL IMPRESSION: ?Patient is a 76 y.o. 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 Pt reports voice changes since 2017. 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 Has difficulty being heard when background noise is present, being heard on telephone, and ordering in drive-thru. Pt uses telephone a lot to stay connected and voice is become more so a barrier in successfully communicating via this modality. Communication success with familiar speaks is highly dependent on the environment with extraneous sounds negatively impacting pt's ability to be heard. C/o decreased breath support, evidenced throughout today's session. 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 Dynamic assessment during evaluation shows pt is stimulable for increasing intensity through using  intentional speech. I recommend skilled ST to address pt's dysarthria, primarily categorized by his low vocal intensity.   ?  ?OBJECTIVE IMPAIRMENTS  ?Objective impairments include dysarthria. These impairments

## 2021-10-29 ENCOUNTER — Ambulatory Visit: Payer: PPO | Admitting: Speech Pathology

## 2021-10-29 ENCOUNTER — Ambulatory Visit: Payer: PPO | Admitting: Physical Therapy

## 2021-10-29 ENCOUNTER — Encounter: Payer: Self-pay | Admitting: Physical Therapy

## 2021-10-29 DIAGNOSIS — R2689 Other abnormalities of gait and mobility: Secondary | ICD-10-CM

## 2021-10-29 DIAGNOSIS — M6281 Muscle weakness (generalized): Secondary | ICD-10-CM

## 2021-10-29 DIAGNOSIS — R471 Dysarthria and anarthria: Secondary | ICD-10-CM

## 2021-10-29 DIAGNOSIS — R29818 Other symptoms and signs involving the nervous system: Secondary | ICD-10-CM

## 2021-10-29 DIAGNOSIS — R2681 Unsteadiness on feet: Secondary | ICD-10-CM

## 2021-10-29 NOTE — Patient Instructions (Addendum)
Tips to reduce freezing episodes with standing or walking: ? ?Stand tall with your feet wide, so that you can rock and weight shift through your hips. ?Don't try to fight the freeze: if you begin taking slower, faster, smaller steps, STOP, get your posture tall, and RESET your posture and balance.  Take a deep breath before taking the BIG step to start again. ?Use auditory cues:  Count out loud, think of a familiar tune or song or cadence, use pocket metronome, to use rhythm to get started walking again. ?Use visual cues:  Use a line to step over, use laser pointer line to step over, (using BIG steps) to start walking again. ?Use visual targets to keep your posture tall (look ahead and focus on an object or target at eye level). ?As you approach where your destination with walking, count your steps out loud and/or focus on your target with your eyes until you are fully there. ? ?Use appropriate assistive device, as advised by your physical therapist to assist with taking longer, consistent steps. ? ? ? ?  ? ?

## 2021-10-29 NOTE — Therapy (Signed)
?OUTPATIENT SPEECH LANGUAGE PATHOLOGY TREATMENT NOTE ? ? ?Patient Name: Stanley Wright ?MRN: [id number] ?DOB:[birth year], 76 y.o., male ?Today's Date: 10/29/2021 ? ?PCP: Scifres, Dorthy, PA-C ?REFERRING PROVIDER: Ludwig Clarks, DO  ? ?END OF SESSION:  ? ? ? ?Past Medical History:  ?Diagnosis Date  ? Arthritis   ? BENIGN PROSTATIC HYPERTROPHY, WITH OBSTRUCTION   ? Cancer [facility])   ? skin, basal, squamous  ? COLONIC POLYPS   ? Diverticulitis   ? DVT (deep venous thrombosis) ([place]) 2000  ? Dysrhythmia   ? Hx Afib- 2017  ? Early cataract   ? Factor 5 Leiden mutation, heterozygous ([place])   ? GERD (gastroesophageal reflux disease)   ? not on medication  ? Hearing aid worn   ? B/L  ? HIATAL HERNIA   ? ITP (idiopathic thrombocytopenic purpura) 2003  ? Long term current use of anticoagulant   ? Osteoarthritis   ? right foot  ? Parkinson's disease ([place]) 02/03/2018  ? PSORIASIS   ? Pulmonary embolus ([place]) 2000  ? Shortness of breath dyspnea   ? at times - Talking alot  ? Sleep apnea   ? Wears glasses   ? ?Past Surgical History:  ?Procedure Laterality Date  ? ANKLE FUSION Right 08/24/2019  ? Procedure: RIGHT TALO-NAVICULAR FUSION;  Surgeon: Newt Minion, MD;  Location: [place];  Service: Orthopedics;  Laterality: Right;  ? CARDIOVERSION N/A 11/22/2015  ? Procedure: CARDIOVERSION;  Surgeon: Sanda Klein, MD;  [facility];  Service: Cardiovascular;  Laterality: N/A;  ? COLONOSCOPY    ? HERNIA REPAIR Left   ? Inguinal- x2 . mesh x1  ? INSERTION OF MESH N/A 02/25/2016  ? Procedure: INSERTION OF MESH;  Surgeon: Rolm Bookbinder, MD;  Location: [place];  Service: General;  Laterality: N/A;  ? LUNG REMOVAL, PARTIAL Right 2004  ? was fungal and not cancerous  ? PROSTATE SURGERY    ? UMBILICAL HERNIA REPAIR N/A 02/25/2016  ? Procedure: LAPAROSCOPIC UMBILICAL HERNIA REPAIR;  Surgeon: Rolm Bookbinder, MD;  Location: [place];  Service: General;  Laterality: N/A;  ? ?Patient Active Problem List  ? Diagnosis Date Noted  ? Primary  osteoarthritis, right ankle and foot   ? Lower extremity edema 08/10/2019  ? Acute DVT (deep venous thrombosis) ([place]) 07/19/2019  ? History of pulmonary embolism 07/19/2019  ? Parkinson's disease ([place]) 02/03/2018  ? Community acquired pneumonia of left lower lobe of lung 12/23/2017  ? Sepsis ([place]) 12/23/2017  ? Morbid obesity due to excess calories ([place]) 08/23/2016  ? OSA on CPAP 08/23/2016  ? Dyspnea on exertion 08/22/2016  ? Umbilical hernia 70/35/0093  ? Persistent atrial fibrillation ([place])   ? Encounter for therapeutic drug monitoring 08/12/2013  ? Long term current use of anticoagulant 07/30/2010  ? COLONIC POLYPS 06/03/2010  ? Factor V Leiden ([place]) 06/03/2010  ? GERD 06/03/2010  ? HIATAL HERNIA 06/03/2010  ? BENIGN PROSTATIC HYPERTROPHY, WITH OBSTRUCTION 06/03/2010  ? PSORIASIS 06/03/2010  ? ? ?ONSET DATE: referred 10/01/2021, dx with Parkinsonism Sept 2019 ?  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 ?THERAPY DIAG:  ?Dysarthria and anarthria ? ?SUBJECTIVE: "not much to it" ? ?PAIN:  ?Are you having pain? Yes ?Description: 7/10; R toe when walking ? ?OBJECTIVE:  ? ?TODAY'S TREATMENT:  ?10-29-21: Pt reports to completing HEP x1/day. Tells ST he did not use intent when at church. Led pt through [employer] 5 with usual demonstration and rare min-A for increasing intensity. Usual mod-A for pacing  to reduce rushes in speech at sentence level and in conversational sample following structured practice. Average loudness this date: ?Loud ah: 93 dB ?Counting: 72 dB ?Reading (phrases): 69 dB ?Cognitive Ex: 67 dB ?Conversational sample, answering ?: 64 dB, usual Max-A; benefits from having main points written as visual aid ? ?10-25-21: Pt returns reporting that he has completed HEP x1/day. ST provides additional education on working through new lesson every day as pt repeating previously addressed lessons. Led pt through [employer] 4. Rare min-A for warm up exercise, increasing to usual  mod-A for reading and cognitive exercises re: intensity and pacing. Increasing self-awareness of losing intensity during structured practice. Average loudness this date: ?Loud ah: 89 dB ?Counting: 78 dB ?Reading (phrases): 74 dB ?Cognitive Ex: 68 dB ?Conversational sample, answering ?: 64 dB, usual Max-A and recasting of utterances  ? ?10-22-21: Led pt through [place] 3. With occasional min-A, averages following dB levels: ?Loud ah: 91 dB ?Counting: 87 dB ?Reading (phrases): 76 dB ?Cognitive Ex: 74 dB ?During conversational exchange, average intensity drops into mid 60s, requiring usual mod-A to increase volume. Usual increased rate of speech, resulting in pt speaking on residual air. Overall decreased intelligibility from structured practice. Education on breath support and pacing for increased communication success. Practiced pacing in pt ordering meal he plans to get following therapy session.  ? ?10-17-21: Target improving vocal quality and increasing intensity through progressively difficulty speech tasks using Speak Out! program, lesson 1. ST leads pt through exercises providing usual model prior to pt execution. Usual mod-A required to achieve target dB this date. Averages this date: loud "ah" 90 dB; reading 74dB; cognitive speech task 71 dB. required this date during structured practice.  Conversational sample of approx 5 minutes, in which pt answered simple, personally relevant questions, pt averages 61 dB with usual max-A.  ?  ?  ?PATIENT EDUCATION: ?Education details: see above ?Person educated: Patient ?Education method: Explanation, Demonstration, and Handouts ?Education comprehension: verbalized understanding, returned demonstration, and needs further education ?  ?  ?HOME EXERCISE PROGRAM: ?Speak Out! Lesson 5 5 ?  ?GOALS: ?Goals reviewed with patient? Yes ?  ?SHORT TERM GOALS: Target date: 11/07/2021 ?  ?Pt will complete dysarthria HEP with mod-I over 2 sessions ?Baseline: ?Goal status: ongoing ?   ?2.  Pt will average 85 dB in x5 loud "ah" production with rare min-A over 2 sessions ?Baseline:  ?Goal status: ongoing ?  ?3.  Pt will exceed 72 dB in 80% of oral reading trials with usual mod-A over 2 sessions ?Baseline:  ?Goal status: ongoing ?  ?4.  Pt will meet dB targets for Speak Out! cognitive exercise (72+ dB) in 80% of trials with usual mod-A over 2 sessions  ?Baseline:  ?Goal status: ongoing ?  ?  ?LONG TERM GOALS: Target date: 12/05/2021 ?  ?Pt will report improved voice related QOL via V-QROL by 10 points (calculated score) by d/c.  ?Baseline: 67.5 calculated score ?Goal status: ongoing ?  ?2.  Pt will average 70+ dB in 10 minute conversational sample with rare min-A over 2 sessions.  ?Baseline:  ?Goal status: ongoing ?  ?3.  Pt will report completion of dysarthria HEP, with perceived benefit, at least 1x per day (recommend BID) over 1 week period.  ?Baseline:  ?Goal status: ongoing ?  ?4.  Pt will average 72+ dB in oral reading trials, paragraph level with rare min-A over 2 sessions ?Baseline:  ?Goal status: ongoing ?  ?5.  Pt will  meet dB targets for Speak Out! cognitive exercise (72+ dB) in 80% of trials with rare min-A over 2 sessions  ?Baseline:  ?Goal status: ongoing ?  ?ASSESSMENT: ?  ?CLINICAL IMPRESSION: ?Patient is a 76 y.o. 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 Ongoing education and structured practice using intent to elevate pt's communicative effectiveness. ST has initiated training with Speak Out! Program with pt currently requiring usual min-A for meeting dB targets, usual mod-A for pacing to prevent rushes of speech, and increased cueing and support for carryover to conversational speech. Increasing awareness of decreased loudness throughout sessions. Recommend continued skilled ST.  ? ?OBJECTIVE IMPAIRMENTS  ?Objective impairments include dysarthria. These impairments are limiting patient from effectively communicating at home and in community.Factors affecting potential  to achieve goals and functional outcome are medical prognosis.. Patient will benefit from skilled SLP services to address above impairments and improve overall function. ?  ?REHAB POTENTIAL: Good ?  ?PLAN: ?SLP

## 2021-10-29 NOTE — Therapy (Signed)
?OUTPATIENT PHYSICAL THERAPY TREATMENT NOTE ? ? ?Patient Name: Stanley Wright ?MRN: [id number] ?DOB:[birth year], 76 y.o., male ?Today's Date: 10/29/2021 ? ?PCP: Scifres, Dorothy, PA-C  ?REFERRING PROVIDER: Ludwig Clarks, DO  ? ?END OF SESSION:  ? PT End of Session - 10/29/21 0848   ? ? Visit Number 4   ? Number of Visits 13   ? Date for PT Re-Evaluation 01/08/22   due to delay in scheduling  ? Authorization Type [employer]   ? PT Start Time 0848   ? PT Stop Time 0929   ? PT Time Calculation (min) 41 min   ? Activity Tolerance Patient tolerated treatment well   ? Behavior During Therapy [facility] for tasks assessed/performed   ? ?  ?  ? ?  ? ? ? ?Past Medical History:  ?Diagnosis Date  ? Arthritis   ? BENIGN PROSTATIC HYPERTROPHY, WITH OBSTRUCTION   ? Cancer [facility])   ? skin, basal, squamous  ? COLONIC POLYPS   ? Diverticulitis   ? DVT (deep venous thrombosis) ([place]) 2000  ? Dysrhythmia   ? Hx Afib- 2017  ? Early cataract   ? Factor 5 Leiden mutation, heterozygous ([place])   ? GERD (gastroesophageal reflux disease)   ? not on medication  ? Hearing aid worn   ? B/L  ? HIATAL HERNIA   ? ITP (idiopathic thrombocytopenic purpura) 2003  ? Long term current use of anticoagulant   ? Osteoarthritis   ? right foot  ? Parkinson's disease ([place]) 02/03/2018  ? PSORIASIS   ? Pulmonary embolus ([place]) 2000  ? Shortness of breath dyspnea   ? at times - Talking alot  ? Sleep apnea   ? Wears glasses   ? ?Past Surgical History:  ?Procedure Laterality Date  ? ANKLE FUSION Right 08/24/2019  ? Procedure: RIGHT TALO-NAVICULAR FUSION;  Surgeon: Newt Minion, MD;  Location: [place];  Service: Orthopedics;  Laterality: Right;  ? CARDIOVERSION N/A 11/22/2015  ? Procedure: CARDIOVERSION;  Surgeon: Sanda Klein, MD;  [facility];  Service: Cardiovascular;  Laterality: N/A;  ? COLONOSCOPY    ? HERNIA REPAIR Left   ? Inguinal- x2 . mesh x1  ? INSERTION OF MESH N/A 02/25/2016  ? Procedure: INSERTION OF MESH;  Surgeon: Rolm Bookbinder, MD;   Location: [place];  Service: General;  Laterality: N/A;  ? LUNG REMOVAL, PARTIAL Right 2004  ? was fungal and not cancerous  ? PROSTATE SURGERY    ? UMBILICAL HERNIA REPAIR N/A 02/25/2016  ? Procedure: LAPAROSCOPIC UMBILICAL HERNIA REPAIR;  Surgeon: Rolm Bookbinder, MD;  Location: [place];  Service: General;  Laterality: N/A;  ? ?Patient Active Problem List  ? Diagnosis Date Noted  ? Primary osteoarthritis, right ankle and foot   ? Lower extremity edema 08/10/2019  ? Acute DVT (deep venous thrombosis) ([place]) 07/19/2019  ? History of pulmonary embolism 07/19/2019  ? Parkinson's disease ([place]) 02/03/2018  ? Community acquired pneumonia of left lower lobe of lung 12/23/2017  ? Sepsis ([place]) 12/23/2017  ? Morbid obesity due to excess calories ([place]) 08/23/2016  ? OSA on CPAP 08/23/2016  ? Dyspnea on exertion 08/22/2016  ? Umbilical hernia 46/28/6381  ? Persistent atrial fibrillation ([place])   ? Encounter for therapeutic drug monitoring 08/12/2013  ? Long term current use of anticoagulant 07/30/2010  ? COLONIC POLYPS 06/03/2010  ? Factor V Leiden ([place]) 06/03/2010  ? GERD 06/03/2010  ? HIATAL HERNIA 06/03/2010  ? BENIGN PROSTATIC HYPERTROPHY, WITH OBSTRUCTION 06/03/2010  ? PSORIASIS  06/03/2010  ? ? ?REFERRING DIAG: G20 (ICD-10-CM) - Primary parkinsonism ([place])   ? ?THERAPY DIAG:  ?Other abnormalities of gait and mobility ? ?Unsteadiness on feet ? ?Other symptoms and signs involving the nervous system ? ?Muscle weakness (generalized) ? 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   ? ?PRECAUTIONS: Fall ? ?SUBJECTIVE: Pt reports that exercises are going well at home. Still freezing. Had trouble getting into here from the car this morning. No falls. Is able to wear a R shoe today and is able  to walk pretty well, feeling better than last time. Have not yet reached out to Dr. Sharol Given.  ? ?PAIN:  ?Are you having pain? No ? ? ?  ?  ?TODAY'S TREATMENT:  ? ?Ther Ex  ?Scifit with BUE/BLE at gear 2.0 > 3.0 for 6 minutes for reciprocal movement, dynamic cardiovascular warmup and endurance. Cued for incr amplitude of movement when performing. Took incr time to stand from [employer] and multiple attempts.  ? ? ? ?NMR  ?5 reps sit <> stands from mat table - working on scooting out towards edge, incr forward lean and nose over toes. Plus additional reps during session. Pt performing without UE support with cues to extend arms out in front of him to help with anterior weight shift. Pt needing to brace BLE against mat table at times, and educated pt that he can use BUE to stand from mat table to help with incr forward lean as pt sometimes takes multiple attempts to stand without UE support. Cued for eccentric control when lowering back to mat table.  ? ? ?Pt performs PWR! Moves in seated position  ?  ?PWR! Up for improved posture x10 reps, cues for incr forward lean, looking forwards, scap retraction and opening up hands.  ? ?PWR! Rock for improved weightshifting x10 reps with reaching across body and progressing to trying to gently kick a leg out.  ? ?PWR! Twist for improved trunk rotation x10 reps, cued to stop in the middle with tall posture before twisting to opposite side, cued to use legs to help with pivoting to twist  ? ?PWR! Step for improved step initiation x10 reps each side-  single step in and out, incr difficulty with larger amplitude movements with RLE. Improved with incr reps. Pt reporting that he does have difficulty with getting legs in and  out of car and has to pick RLE up.  ? ?Cues provided for technique, larger amplitude movement patterns, and how each movement relates to function. Pt reporting effort level asa  5-6/10 when performing. Educated on trying to work towards a 7/10.  ? ?Provided as HEP and  gave handout. Pt reporting feeling less stiff after performing exercises.  ? ?  ? ?GAIT: ?Gait pattern: step through pattern, decreased arm swing- Right, decreased arm swing- Left, decreased step length- Left, decreased stance time- Right, decreased stride length, decreased ankle dorsiflexion- Right, and shuffling ? ?Distance walked: 230', plus additional distances during session.  ?Assistive device utilized: U-step walker  ?Level of assistance: SBA ? ?Comments: Worked on freezing techniques as pt demonstrates freezing initially when standing before initiating gait and pt reporting freezing episodes when ambulating into clinic today. Cued when initially in standing to stand tall with wide BOS and perform lateral weight shifting  before initiating a big step. Practiced x5 times during session. Also worked with incr stride length during gait with heel strike as pt with tendency to ambulate with foot flat.  ? ?When therapist would converse with pt or pt was distracted in gym noted pt would revert to more shuffled steps and freezing/festination. Pt would need cues to stop and reset and perform gentle rocking before initiating gait. Provided handout on techniques for freezing episodes and importance of stopping and resetting before initiating gait again.  ? ? ? ?  ? ?PATIENT EDUCATION: ?Education details: Seated PWR moves to HEP.  Freezing techniques (provided handout) ?Person educated: Patient ?Education method: Explanation and handout ?Education comprehension: verbalized understanding and needs further education  ?  ?  ?HOME EXERCISE PROGRAM: ? Seated PWR moves  ? ?Access Code: [email or web address] ?URL: [email or web address] ?Date: 10/18/2021 ?Prepared by: Mickie Bail Plaster ? ?Exercises ?- Sit to Stand Without Arm Support  - 1 x daily - 7 x weekly - 3 sets - 10 reps ?- Proper Sit to Stand Technique  - 1 x daily - 7 x weekly - 3 sets - 10 reps ?- Seated Shoulder Diagonal Pulls with Resistance  - 1 x daily - 7 x weekly -  3 sets - 10 reps ?- Lateral Weight Shift with Arm Raise  - 1 x daily - 7 x weekly - 3 sets - 10 reps ?- Standing Quarter Turn with Counter Support  - 1 x daily - 7 x weekly - 3 sets - 10 reps ?  ?  ?  ?GOA

## 2021-10-30 ENCOUNTER — Other Ambulatory Visit: Payer: Self-pay | Admitting: Neurology

## 2021-10-30 DIAGNOSIS — G20 Parkinson's disease: Secondary | ICD-10-CM

## 2021-10-30 DIAGNOSIS — G44209 Tension-type headache, unspecified, not intractable: Secondary | ICD-10-CM | POA: Diagnosis not present

## 2021-10-30 DIAGNOSIS — M542 Cervicalgia: Secondary | ICD-10-CM | POA: Diagnosis not present

## 2021-10-30 DIAGNOSIS — M47812 Spondylosis without myelopathy or radiculopathy, cervical region: Secondary | ICD-10-CM | POA: Diagnosis not present

## 2021-10-30 DIAGNOSIS — M9901 Segmental and somatic dysfunction of cervical region: Secondary | ICD-10-CM | POA: Diagnosis not present

## 2021-10-31 ENCOUNTER — Encounter: Payer: Self-pay | Admitting: Physical Therapy

## 2021-10-31 ENCOUNTER — Ambulatory Visit: Payer: PPO | Admitting: Speech Pathology

## 2021-10-31 ENCOUNTER — Ambulatory Visit: Payer: PPO | Admitting: Physical Therapy

## 2021-10-31 DIAGNOSIS — M6281 Muscle weakness (generalized): Secondary | ICD-10-CM

## 2021-10-31 DIAGNOSIS — R2689 Other abnormalities of gait and mobility: Secondary | ICD-10-CM

## 2021-10-31 DIAGNOSIS — R471 Dysarthria and anarthria: Secondary | ICD-10-CM

## 2021-10-31 DIAGNOSIS — R2681 Unsteadiness on feet: Secondary | ICD-10-CM

## 2021-10-31 NOTE — Therapy (Signed)
OUTPATIENT SPEECH LANGUAGE PATHOLOGY TREATMENT NOTE   Patient Name: Stanley Wright MRN: [id number] DOB:January 08, 1946, 76 y.o., male Today's Date: 10/31/2021  PCP: Scifres, Dorthy, PA-C REFERRING PROVIDER: Ludwig Clarks, DO   END OF SESSION:   End of Session - 10/31/21 0847     Visit Number 6    Number of Visits 17    Date for SLP Re-Evaluation 12/05/21    Authorization Type [employer]    SLP Start Time 0845    SLP Stop Time  0924    SLP Time Calculation (min) 39 min    Activity Tolerance Patient tolerated treatment well              Past Medical History:  Diagnosis Date   Arthritis    BENIGN PROSTATIC HYPERTROPHY, WITH OBSTRUCTION    Cancer ([place])    skin, basal, squamous   COLONIC POLYPS    Diverticulitis    DVT (deep venous thrombosis) ([place]) 2000   Dysrhythmia    Hx Afib- 2017   Early cataract    Factor 5 Leiden mutation, heterozygous ([place])    GERD (gastroesophageal reflux disease)    not on medication   Hearing aid worn    B/L   HIATAL HERNIA    ITP (idiopathic thrombocytopenic purpura) 2003   Long term current use of anticoagulant    Osteoarthritis    right foot   Parkinson's disease ([place]) 02/03/2018   PSORIASIS    Pulmonary embolus ([place]) 2000   Shortness of breath dyspnea    at times - Talking alot   Sleep apnea    Wears glasses    Past Surgical History:  Procedure Laterality Date   ANKLE FUSION Right 08/24/2019   Procedure: RIGHT TALO-NAVICULAR FUSION;  Surgeon: Newt Minion, MD;  Location: [place];  Service: Orthopedics;  Laterality: Right;   CARDIOVERSION N/A 11/22/2015   Procedure: CARDIOVERSION;  Surgeon: Sanda Klein, MD;  Location: [place];  Service: Cardiovascular;  Laterality: N/A;   COLONOSCOPY     HERNIA REPAIR Left    Inguinal- x2 . mesh x1   INSERTION OF MESH N/A 02/25/2016   Procedure: INSERTION OF MESH;  Surgeon: Rolm Bookbinder, MD;  Location: [place];  Service: General;  Laterality: N/A;   LUNG REMOVAL, PARTIAL  Right 2004   was fungal and not cancerous   PROSTATE SURGERY     UMBILICAL HERNIA REPAIR N/A 02/25/2016   Procedure: LAPAROSCOPIC UMBILICAL HERNIA REPAIR;  Surgeon: Rolm Bookbinder, MD;  Location: [place];  Service: General;  Laterality: N/A;   Patient Active Problem List   Diagnosis Date Noted   Primary osteoarthritis, right ankle and foot    Lower extremity edema 08/10/2019   Acute DVT (deep venous thrombosis) ([place]) 07/19/2019   History of pulmonary embolism 07/19/2019   Parkinson's disease ([place]) 02/03/2018   Community acquired pneumonia of left lower lobe of lung 12/23/2017   Sepsis ([place]) 12/23/2017   Morbid obesity due to excess calories ([place]) 08/23/2016   OSA on CPAP 08/23/2016   Dyspnea on exertion 60/73/7106   Umbilical hernia 26/94/8546   Persistent atrial fibrillation ([place])    Encounter for therapeutic drug monitoring 08/12/2013   Long term current use of anticoagulant 07/30/2010   COLONIC POLYPS 06/03/2010   Factor V Leiden ([place]) 06/03/2010   GERD 06/03/2010   HIATAL HERNIA 06/03/2010   BENIGN PROSTATIC HYPERTROPHY, WITH OBSTRUCTION 06/03/2010   PSORIASIS 06/03/2010    ONSET DATE: referred 10/01/2021, dx with Parkinsonism Sept 2019   REFERRING  DIAG: G20 (ICD-10-CM) - Primary parkinsonism ([place])   THERAPY DIAG:  Dysarthria and anarthria  SUBJECTIVE: "I have not been speaking with intent"  PAIN:  Are you having pain? Yes Description: 5/10; R toe when walking  OBJECTIVE:   TODAY'S TREATMENT:  10-31-21: Pt enters with low volume, low 60s dB. Given cues to intentional speech able to raise volume in response to conversational speech. Reports to completing HEP x1/day, has established routine of where and when to complete. Rare min-A for intensity and pacing throughout structured practice. Average dB this date: Loud ah: 85 dB Counting: 74 dB Reading (phrases): 72 dB Cognitive Ex: 69 dB Conversational sample, answering ?: 64 dB, usual Max-A Had pt call and leave  himself a VM, using intentional speech. VM clear and intelligible. Pt satisfied with his voice. Pt HEP to complete additional speak out lessons and call son via phone using intentional speech.   10-29-21: Pt reports to completing HEP x1/day. Tells ST he did not use intent when at church. Led pt through [employer] 5 with usual demonstration and rare min-A for increasing intensity. Usual mod-A for pacing to reduce rushes in speech at sentence level and in conversational sample following structured practice. Average loudness this date: Loud ah: 93 dB Counting: 72 dB Reading (phrases): 69 dB Cognitive Ex: 67 dB Conversational sample, answering ?: 64 dB, usual Max-A; benefits from having main points written as visual aid  10-25-21: Pt returns reporting that he has completed HEP x1/day. ST provides additional education on working through new lesson every day as pt repeating previously addressed lessons. Led pt through [employer] 4. Rare min-A for warm up exercise, increasing to usual mod-A for reading and cognitive exercises re: intensity and pacing. Increasing self-awareness of losing intensity during structured practice. Average loudness this date: Loud ah: 89 dB Counting: 78 dB Reading (phrases): 74 dB Cognitive Ex: 68 dB Conversational sample, answering ?: 64 dB, usual Max-A and recasting of utterances   10-22-21: Led pt through Speak Out lesson 3. With occasional min-A, averages following dB levels: Loud ah: 91 dB Counting: 87 dB Reading (phrases): 76 dB Cognitive Ex: 74 dB During conversational exchange, average intensity drops into mid 60s, requiring usual mod-A to increase volume. Usual increased rate of speech, resulting in pt speaking on residual air. Overall decreased intelligibility from structured practice. Education on breath support and pacing for increased communication success. Practiced pacing in pt ordering meal he plans to get following therapy session.   10-17-21: Target  improving vocal quality and increasing intensity through progressively difficulty speech tasks using Speak Out! program, lesson 1. ST leads pt through exercises providing usual model prior to pt execution. Usual mod-A required to achieve target dB this date. Averages this date: loud "ah" 90 dB; reading 74dB; cognitive speech task 71 dB. required this date during structured practice.  Conversational sample of approx 5 minutes, in which pt answered simple, personally relevant questions, pt averages 61 dB with usual max-A.      PATIENT EDUCATION: Education details: see above Person educated: Patient Education method: Explanation, Demonstration, and Handouts Education comprehension: verbalized understanding, returned demonstration, and needs further education     HOME EXERCISE PROGRAM: Speak Out! Lesson 7, 8; call son on phone   GOALS: Goals reviewed with patient? Yes   SHORT TERM GOALS: Target date: 11/07/2021   Pt will complete dysarthria HEP with mod-I over 2 sessions Baseline: 10-31-2021 Goal status: ongoing   2.  Pt will average 85 dB in  x5 loud "ah" production with rare min-A over 2 sessions Baseline: 10-31-2021 Goal status: ongoing   3.  Pt will exceed 72 dB in 80% of oral reading trials with usual mod-A over 2 sessions Baseline:  Goal status: ongoing   4.  Pt will meet dB targets for Speak Out! cognitive exercise (72+ dB) in 80% of trials with usual mod-A over 2 sessions  Baseline:  Goal status: ongoing     LONG TERM GOALS: Target date: 12/05/2021   Pt will report improved voice related QOL via V-QROL by 10 points (calculated score) by d/c.  Baseline: 67.5 calculated score Goal status: ongoing   2.  Pt will average 70+ dB in 10 minute conversational sample with rare min-A over 2 sessions.  Baseline:  Goal status: ongoing   3.  Pt will report completion of dysarthria HEP, with perceived benefit, at least 1x per day (recommend BID) over 1 week period.  Baseline:  Goal  status: ongoing   4.  Pt will average 72+ dB in oral reading trials, paragraph level with rare min-A over 2 sessions Baseline:  Goal status: ongoing   5.  Pt will meet dB targets for Speak Out! cognitive exercise (72+ dB) in 80% of trials with rare min-A over 2 sessions  Baseline:  Goal status: ongoing   ASSESSMENT:   CLINICAL IMPRESSION: Patient is a 76 y.o. 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 Ongoing education and structured practice using intent to elevate pt's communicative effectiveness. ST has initiated training with Speak Out! Program with pt currently requiring usual min-A for meeting dB targets, usual mod-A for pacing to prevent rushes of speech, and increased cueing and support for carryover to conversational speech. Increasing awareness of decreased loudness throughout sessions. Recommend continued skilled ST.   OBJECTIVE IMPAIRMENTS  Objective impairments include dysarthria. These impairments are limiting patient from effectively communicating at home and in community.Factors affecting potential to achieve goals and functional outcome are medical prognosis.. Patient will benefit from skilled SLP services to address above impairments and improve overall function.   REHAB POTENTIAL: Good   PLAN: SLP FREQUENCY: 2x/week   SLP DURATION: 8 weeks   PLANNED INTERVENTIONS: Cueing hierachy, Internal/external aids, Functional tasks, SLP instruction and feedback, Compensatory strategies, and Patient/family education  Su Monks, CCC-SLP 10/31/2021, 9:24 AM

## 2021-10-31 NOTE — Therapy (Signed)
OUTPATIENT PHYSICAL THERAPY TREATMENT NOTE   Patient Name: Stanley Wright MRN: [id number] DOB:05/27/1946, 76 y.o., male Today's Date: 10/31/2021  PCP: Maude Leriche, PA-C  REFERRING PROVIDER: Ludwig Clarks, DO   END OF SESSION:   PT End of Session - 10/31/21 0806     Visit Number 5    Number of Visits 13    Date for PT Re-Evaluation 01/08/22   due to delay in scheduling   Authorization Type [employer]    PT Start Time 0804    PT Stop Time 0844    PT Time Calculation (min) 40 min    Activity Tolerance Patient tolerated treatment well    Behavior During Therapy WFL for tasks assessed/performed              Past Medical History:  Diagnosis Date   Arthritis    BENIGN PROSTATIC HYPERTROPHY, WITH OBSTRUCTION    Cancer ([place])    skin, basal, squamous   COLONIC POLYPS    Diverticulitis    DVT (deep venous thrombosis) ([place]) 2000   Dysrhythmia    Hx Afib- 2017   Early cataract    Factor 5 Leiden mutation, heterozygous ([place])    GERD (gastroesophageal reflux disease)    not on medication   Hearing aid worn    B/L   HIATAL HERNIA    ITP (idiopathic thrombocytopenic purpura) 2003   Long term current use of anticoagulant    Osteoarthritis    right foot   Parkinson's disease ([place]) 02/03/2018   PSORIASIS    Pulmonary embolus ([place]) 2000   Shortness of breath dyspnea    at times - Talking alot   Sleep apnea    Wears glasses    Past Surgical History:  Procedure Laterality Date   ANKLE FUSION Right 08/24/2019   Procedure: RIGHT TALO-NAVICULAR FUSION;  Surgeon: Newt Minion, MD;  Location: [facility];  Service: Orthopedics;  Laterality: Right;   CARDIOVERSION N/A 11/22/2015   Procedure: CARDIOVERSION;  Surgeon: Sanda Klein, MD;  Location: [facility];  Service: Cardiovascular;  Laterality: N/A;   COLONOSCOPY     HERNIA REPAIR Left    Inguinal- x2 . mesh x1   INSERTION OF MESH N/A 02/25/2016   Procedure: INSERTION OF MESH;  Surgeon: Rolm Bookbinder, MD;   Location: [facility];  Service: General;  Laterality: N/A;   LUNG REMOVAL, PARTIAL Right 2004   was fungal and not cancerous   PROSTATE SURGERY     UMBILICAL HERNIA REPAIR N/A 02/25/2016   Procedure: LAPAROSCOPIC UMBILICAL HERNIA REPAIR;  Surgeon: Rolm Bookbinder, MD;  Location: [facility];  Service: General;  Laterality: N/A;   Patient Active Problem List   Diagnosis Date Noted   Primary osteoarthritis, right ankle and foot    Lower extremity edema 08/10/2019   Acute DVT (deep venous thrombosis) ([place]) 07/19/2019   History of pulmonary embolism 07/19/2019   Parkinson's disease ([place]) 02/03/2018   Community acquired pneumonia of left lower lobe of lung 12/23/2017   Sepsis ([place]) 12/23/2017   Morbid obesity due to excess calories ([place]) 08/23/2016   OSA on CPAP 08/23/2016   Dyspnea on exertion 41/32/4401   Umbilical hernia 02/72/5366   Persistent atrial fibrillation ([place])    Encounter for therapeutic drug monitoring 08/12/2013   Long term current use of anticoagulant 07/30/2010   COLONIC POLYPS 06/03/2010   Factor V Leiden ([place]) 06/03/2010   GERD 06/03/2010   HIATAL HERNIA 06/03/2010   BENIGN PROSTATIC HYPERTROPHY, WITH OBSTRUCTION 06/03/2010   PSORIASIS  06/03/2010    REFERRING DIAG: G20 (ICD-10-CM) - Primary parkinsonism ([place])    THERAPY DIAG:  Other abnormalities of gait and mobility  Unsteadiness on feet  Muscle weakness (generalized)  PERTINENT HISTORY:  Parkinsonism, 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    PRECAUTIONS: Fall  SUBJECTIVE: Pt reports no falls or pain. Continues with right little toe pain when it hits on something, still able to get his shoes on. Has not called Dr. Sharol Given about it.   PAIN:  Are you having pain? No       TODAY'S TREATMENT:   10/31/2021 STRENGTHENING  Scifit with BUE/BLE level 3.0 x 8 minutes with goal >/= 60 steps for strengthening, reciprocal movement patterns and activity tolerance.    BALANCE/NMR Had pt perform series of large amplitude movements for improved posture, mobility and coordination. Cues provided for technique, larger amplitude movement patterns, and how each movement relates to function.  - Forward lean with hands on knees<>up into tall sitting with arms extended out to sides for 't"  for improved posture x10 reps, cues for incr forward lean, looking forwards, scap retraction and opening up hands.   - Reaching across body alternating sides for improved weightshifting x10 reps with cues for reaching across body and progressing to extending same side leg out while reaching with PTA hands as targets to keep pt cross body reaching as he was tending to reach up when LE movement added in.  - For improved trunk rotation had pt hold arms out to sides, then twisting body to "clap" them together at the side, alternating sides  x10 reps each way, cued to stop in the middle with tall posture before twisting to opposite side, cued to use legs to help with pivoting to twist   - For improved step initiation x10 reps each side-  single step in and out- initially with no targets then with use of yard stick on both sides for targets for 10-12 reps each way/each side, UE support on mat.      In parallel bars working on stepping strategy, longer steps and increased hip/knee flexion - with black foam beam- single beam for stepping over<>back alternating LE's x 10 reps each side with light UE support, progressing to double stacked beams alternating stepping over<>back for 10 reps with light UE support.  Then with feet in parallel stance hip width apart and one black bolster next to each foot- alternating stepping out over bolsters<>back in for 10 reps each side with bil UE support on bars. Pt needed cues through out all  tasks to slow down, for weight shifting and increased hip/knee flexion.  - with 4 small hurdles in parallel bars- forward reciprocal stepping  x 6 laps with light bil UE support. Cues to slow down and for increased hip/knee flexion and decreased circumduction. Min guard assist for safety.      GAIT: Gait pattern: step through pattern, decreased arm swing- Right, decreased arm swing- Left, decreased step length- Left, decreased stance time- Right, decreased stride length, decreased ankle dorsiflexion- Right, and shuffling Distance walked:  230 x 1, plus around clinic with session Assistive device utilized: U-step walker  Level of assistance: supervision to min guard assist  Comments: pt with more fluid steps today, no freezing noted with gait today on straightaway  paths, however pt did have shuffling/freezing type steps when having to turn to prep for sitting down each time. Cues to use a strategy provided at last session to assist with decreasing these episodes.       PATIENT EDUCATION: Education details: continue with current HEP Person educated: Patient Education method: Explanation and handout Education comprehension: verbalized understanding and needs further education      HOME EXERCISE PROGRAM:  Seated PWR moves   Access Code: [email or web address] URL: [email or web address] Date: 10/18/2021 Prepared by: Mickie Bail Plaster  Exercises - Sit to Stand Without Arm Support  - 1 x daily - 7 x weekly - 3 sets - 10 reps - Proper Sit to Stand Technique  - 1 x daily - 7 x weekly - 3 sets - 10 reps - Seated Shoulder Diagonal Pulls with Resistance  - 1 x daily - 7 x weekly - 3 sets - 10 reps - Lateral Weight Shift with Arm Raise  - 1 x daily - 7 x weekly - 3 sets - 10 reps - Standing Quarter Turn with Counter Support  - 1 x daily - 7 x weekly - 3 sets - 10 reps       GOALS: Goals reviewed with patient? Yes   SHORT TERM GOALS: Target date: 11/07/2021   Pt will be independent with  initial HEP for improved flexibility, strength, balance, transfers, and gait.     Baseline: Goal status: INITIAL   2.  Pt will verbalize/demo understanding of tips to reduce festination with turns and gait.  Baseline:  Goal status: INITIAL   3.  Pt will perform 10 reps of sit <>stands with supervision and BUE support without episodes of BLE  bracing in order to demo improved safety w/ transfers Baseline:  Goal status: INITIAL   4.  Pt will undergo further assessment of miniBEST w/ LTG written. Baseline: 17/28 Goal status: MET   5.  Pt will decr manual TUG to 15 seconds or less for decr fall risk.  Baseline: 17.19 seconds, festination w/ initiation of gait  Goal status: INITIAL     LONG TERM GOALS: Target date: 12/05/2021   Pt will be independent with final HEP for improved flexibility, strength, balance, transfers, and gait.   Baseline:  Goal status: INITIAL   2.  Pt will improve MiniBest to 22/28 for decreased fall risk and improvement with compensatory stepping strategies.   Baseline: 17/28 Goal status: INITIAL   3.  Pt will decr cog TUG to 16 seconds or less for decr fall risk.  Baseline: 18.09 seconds, freezing with turns  Goal status: INITIAL   4.  Pt will improve gait speed with U-Step Walker to at least 2.7 ft/sec in order to demo improved community mobility.    Baseline: 2.38 ft/sec with U-step Walker Goal status: INITIAL     ASSESSMENT:   CLINICAL IMPRESSION: Skilled session continued to focus on strengthening, gait and emphasis on large amplitude movement with no issues noted or reported in session. The pt appears to be making steady progress toward goals and should benefit from continued PT to progress toward unmet goals.        OBJECTIVE IMPAIRMENTS Abnormal gait, decreased activity tolerance, decreased balance, decreased coordination, decreased knowledge of use of DME, difficulty walking, decreased strength, impaired flexibility, postural dysfunction,  and pain.    ACTIVITY LIMITATIONS community activity.    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      REHAB POTENTIAL: Good   CLINICAL DECISION MAKING: Evolving/moderate complexity   EVALUATION COMPLEXITY: Moderate   PLAN: PT FREQUENCY: 2x/week   PT DURATION: 12 weeks   PLANNED INTERVENTIONS: Therapeutic exercises, Therapeutic activity, Neuromuscular re-education, Balance training, Gait training, Patient/Family education, and DME instructions   PLAN FOR NEXT SESSION:  Review seated PWR moves. Work on A/P stepping, review festination and freezing strategies, sit<>stand functional strengthening and proper technique, turns. Obstacle navigation, resisted walking    Willow Ora, [place], [facility] [address], [place], [place] [address] [phone] 10/31/21, 9:22 AM

## 2021-11-01 ENCOUNTER — Ambulatory Visit (INDEPENDENT_AMBULATORY_CARE_PROVIDER_SITE_OTHER): Payer: PPO

## 2021-11-01 DIAGNOSIS — I4819 Other persistent atrial fibrillation: Secondary | ICD-10-CM | POA: Diagnosis not present

## 2021-11-01 DIAGNOSIS — Z5181 Encounter for therapeutic drug level monitoring: Secondary | ICD-10-CM | POA: Diagnosis not present

## 2021-11-01 LAB — POCT INR: INR: 3.4 — AB (ref 2.0–3.0)

## 2021-11-01 NOTE — Patient Instructions (Addendum)
Description   Today eat a serving of greens today and only take 1 tablet and then continue taking 1 tablet daily except for 1.5 tablets on Mondays and Fridays.  Recheck INR in 2 weeks.  [facility] [phone]

## 2021-11-05 ENCOUNTER — Ambulatory Visit: Payer: PPO | Admitting: Speech Pathology

## 2021-11-05 ENCOUNTER — Ambulatory Visit: Payer: PPO | Admitting: Physical Therapy

## 2021-11-05 DIAGNOSIS — R2689 Other abnormalities of gait and mobility: Secondary | ICD-10-CM

## 2021-11-05 DIAGNOSIS — R293 Abnormal posture: Secondary | ICD-10-CM

## 2021-11-05 DIAGNOSIS — R2681 Unsteadiness on feet: Secondary | ICD-10-CM

## 2021-11-05 DIAGNOSIS — R471 Dysarthria and anarthria: Secondary | ICD-10-CM

## 2021-11-05 NOTE — Therapy (Signed)
OUTPATIENT SPEECH LANGUAGE PATHOLOGY TREATMENT NOTE   Patient Name: Stanley Wright MRN: [id number] DOB:07-28-1945, 76 y.o., male Today's Date: 11/05/2021  PCP: Scifres, Dorthy, PA-C REFERRING PROVIDER: Ludwig Clarks, DO   END OF SESSION:   End of Session - 11/05/21 0904     Visit Number 7    Number of Visits 17    Date for SLP Re-Evaluation 12/05/21    Authorization Type [employer]    SLP Start Time 0930    SLP Stop Time  1009    SLP Time Calculation (min) 39 min    Activity Tolerance Patient tolerated treatment well              Past Medical History:  Diagnosis Date   Arthritis    BENIGN PROSTATIC HYPERTROPHY, WITH OBSTRUCTION    Cancer ([place])    skin, basal, squamous   COLONIC POLYPS    Diverticulitis    DVT (deep venous thrombosis) ([place]) 2000   Dysrhythmia    Hx Afib- 2017   Early cataract    Factor 5 Leiden mutation, heterozygous ([place])    GERD (gastroesophageal reflux disease)    not on medication   Hearing aid worn    B/L   HIATAL HERNIA    ITP (idiopathic thrombocytopenic purpura) 2003   Long term current use of anticoagulant    Osteoarthritis    right foot   Parkinson's disease ([place]) 02/03/2018   PSORIASIS    Pulmonary embolus ([place]) 2000   Shortness of breath dyspnea    at times - Talking alot   Sleep apnea    Wears glasses    Past Surgical History:  Procedure Laterality Date   ANKLE FUSION Right 08/24/2019   Procedure: RIGHT TALO-NAVICULAR FUSION;  Surgeon: Newt Minion, MD;  Location: [facility];  Service: Orthopedics;  Laterality: Right;   CARDIOVERSION N/A 11/22/2015   Procedure: CARDIOVERSION;  Surgeon: Sanda Klein, MD;  Location: [facility];  Service: Cardiovascular;  Laterality: N/A;   COLONOSCOPY     HERNIA REPAIR Left    Inguinal- x2 . mesh x1   INSERTION OF MESH N/A 02/25/2016   Procedure: INSERTION OF MESH;  Surgeon: Rolm Bookbinder, MD;  Location: [facility];  Service: General;  Laterality: N/A;   LUNG REMOVAL, PARTIAL  Right 2004   was fungal and not cancerous   PROSTATE SURGERY     UMBILICAL HERNIA REPAIR N/A 02/25/2016   Procedure: LAPAROSCOPIC UMBILICAL HERNIA REPAIR;  Surgeon: Rolm Bookbinder, MD;  Location: [facility];  Service: General;  Laterality: N/A;   Patient Active Problem List   Diagnosis Date Noted   Primary osteoarthritis, right ankle and foot    Lower extremity edema 08/10/2019   Acute DVT (deep venous thrombosis) ([place]) 07/19/2019   History of pulmonary embolism 07/19/2019   Parkinson's disease ([place]) 02/03/2018   Community acquired pneumonia of left lower lobe of lung 12/23/2017   Sepsis ([place]) 12/23/2017   Morbid obesity due to excess calories ([place]) 08/23/2016   OSA on CPAP 08/23/2016   Dyspnea on exertion 88/41/6606   Umbilical hernia 30/16/0109   Persistent atrial fibrillation ([place])    Encounter for therapeutic drug monitoring 08/12/2013   Long term current use of anticoagulant 07/30/2010   COLONIC POLYPS 06/03/2010   Factor V Leiden ([place]) 06/03/2010   GERD 06/03/2010   HIATAL HERNIA 06/03/2010   BENIGN PROSTATIC HYPERTROPHY, WITH OBSTRUCTION 06/03/2010   PSORIASIS 06/03/2010    ONSET DATE: referred 10/01/2021, dx with Parkinsonism Sept 2019   REFERRING  DIAG: G20 (ICD-10-CM) - Primary parkinsonism ([place])   THERAPY DIAG:  Dysarthria and anarthria  SUBJECTIVE: "I didn't need any" re: using intent during phone conversation  PAIN:  Are you having pain? No Description: n/a  OBJECTIVE:   TODAY'S TREATMENT:  11-05-21: Pt reports minimal home practice since last session. Enters session with decreased volume and intelligibility. Led pt through [employer]! Lesson 8. Rare min-A for warm up exercises. Usual mod-A to meet average dB for reading and cognitive exercises. Increasing awareness of use or lack of intent during structured practice. Limited carryover this date to conversational, spontaneous utterances, despite usual max-A. Loud ah: 86 dB Counting: 76 dB Reading (sentences): 75  dB Cognitive Ex: 68 dB  10-31-21: Pt enters with low volume, low 60s dB. Given cues to intentional speech able to raise volume in response to conversational speech. Reports to completing HEP x1/day, has established routine of where and when to complete. Rare min-A for intensity and pacing throughout structured practice. Average dB this date: Loud ah: 85 dB Counting: 74 dB Reading (phrases): 72 dB Cognitive Ex: 69 dB Conversational sample, answering ?: 64 dB, usual Max-A Had pt call and leave himself a VM, using intentional speech. VM clear and intelligible. Pt satisfied with his voice. Pt HEP to complete additional speak out lessons and call son via phone using intentional speech.   10-29-21: Pt reports to completing HEP x1/day. Tells ST he did not use intent when at church. Led pt through [employer] 5 with usual demonstration and rare min-A for increasing intensity. Usual mod-A for pacing to reduce rushes in speech at sentence level and in conversational sample following structured practice. Average loudness this date: Loud ah: 93 dB Counting: 72 dB Reading (phrases): 69 dB Cognitive Ex: 67 dB Conversational sample, answering ?: 64 dB, usual Max-A; benefits from having main points written as visual aid  PATIENT EDUCATION: Education details: see above Person educated: Patient Education method: Explanation, Demonstration, and Handouts Education comprehension: verbalized understanding, returned demonstration, and needs further education     HOME EXERCISE PROGRAM: Speak Out! Lesson 7, 8; call son on phone   GOALS: Goals reviewed with patient? Yes   SHORT TERM GOALS: Target date: 11/07/2021   Pt will complete dysarthria HEP with mod-I over 2 sessions Baseline: 10-31-2021, 11-05-21 Goal status: met   2.  Pt will average 85 dB in x5 loud "ah" production with rare min-A over 2 sessions Baseline: 10-31-2021, 11-05-21 Goal status: met   3.  Pt will exceed 72 dB in 80% of oral reading  trials with usual mod-A over 2 sessions Baseline: 11-05-21 Goal status: ongoing   4.  Pt will meet dB targets for Speak Out! cognitive exercise (72+ dB) in 80% of trials with usual mod-A over 2 sessions  Baseline: 11-05-21 Goal status: ongoing     LONG TERM GOALS: Target date: 12/05/2021   Pt will report improved voice related QOL via V-QROL by 10 points (calculated score) by d/c.  Baseline: 67.5 calculated score Goal status: ongoing   2.  Pt will average 70+ dB in 10 minute conversational sample with rare min-A over 2 sessions.  Baseline:  Goal status: ongoing   3.  Pt will report completion of dysarthria HEP, with perceived benefit, at least 1x per day (recommend BID) over 1 week period.  Baseline:  Goal status: ongoing   4.  Pt will average 72+ dB in oral reading trials, paragraph level with rare min-A over 2 sessions Baseline:  Goal  status: ongoing   5.  Pt will meet dB targets for Speak Out! cognitive exercise (72+ dB) in 80% of trials with rare min-A over 2 sessions  Baseline:  Goal status: ongoing   ASSESSMENT:   CLINICAL IMPRESSION: Patient is a 76 y.o. 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 Ongoing education and structured practice using intent to elevate pt's communicative effectiveness. ST has initiated training with Speak Out! Program with pt currently requiring usual min-A for meeting dB targets, usual mod-A for pacing to prevent rushes of speech, and increased cueing and support for carryover to conversational speech. Increasing awareness of decreased loudness throughout sessions. Recommend continued skilled ST.   OBJECTIVE IMPAIRMENTS  Objective impairments include dysarthria. These impairments are limiting patient from effectively communicating at home and in community.Factors affecting potential to achieve goals and functional outcome are medical prognosis.. Patient will benefit from skilled SLP services to address above impairments and improve  overall function.   REHAB POTENTIAL: Good   PLAN: SLP FREQUENCY: 2x/week   SLP DURATION: 8 weeks   PLANNED INTERVENTIONS: Cueing hierachy, Internal/external aids, Functional tasks, SLP instruction and feedback, Compensatory strategies, and Patient/family education  Su Monks, CCC-SLP 11/05/2021, 9:16 AM

## 2021-11-05 NOTE — Therapy (Signed)
OUTPATIENT PHYSICAL THERAPY TREATMENT NOTE   Patient Name: Stanley Wright MRN: [id number] DOB:1945-12-20, 76 y.o., male Today's Date: 11/05/2021  PCP: Maude Leriche, PA-C  REFERRING PROVIDER: Ludwig Clarks, DO   END OF SESSION:   PT End of Session - 11/05/21 0850     Visit Number 6    Number of Visits 13    Date for PT Re-Evaluation 01/08/22   due to delay in scheduling   Authorization Type [employer]    PT Start Time 0848    PT Stop Time 0927    PT Time Calculation (min) 39 min    Equipment Utilized During Treatment Gait belt    Activity Tolerance Patient tolerated treatment well    Behavior During Therapy WFL for tasks assessed/performed               Past Medical History:  Diagnosis Date   Arthritis    BENIGN PROSTATIC HYPERTROPHY, WITH OBSTRUCTION    Cancer ([place])    skin, basal, squamous   COLONIC POLYPS    Diverticulitis    DVT (deep venous thrombosis) ([place]) 2000   Dysrhythmia    Hx Afib- 2017   Early cataract    Factor 5 Leiden mutation, heterozygous ([place])    GERD (gastroesophageal reflux disease)    not on medication   Hearing aid worn    B/L   HIATAL HERNIA    ITP (idiopathic thrombocytopenic purpura) 2003   Long term current use of anticoagulant    Osteoarthritis    right foot   Parkinson's disease ([place]) 02/03/2018   PSORIASIS    Pulmonary embolus ([place]) 2000   Shortness of breath dyspnea    at times - Talking alot   Sleep apnea    Wears glasses    Past Surgical History:  Procedure Laterality Date   ANKLE FUSION Right 08/24/2019   Procedure: RIGHT TALO-NAVICULAR FUSION;  Surgeon: Newt Minion, MD;  Location: [facility];  Service: Orthopedics;  Laterality: Right;   CARDIOVERSION N/A 11/22/2015   Procedure: CARDIOVERSION;  Surgeon: Sanda Klein, MD;  Location: [facility];  Service: Cardiovascular;  Laterality: N/A;   COLONOSCOPY     HERNIA REPAIR Left    Inguinal- x2 . mesh x1   INSERTION OF MESH N/A 02/25/2016   Procedure:  INSERTION OF MESH;  Surgeon: Rolm Bookbinder, MD;  Location: [facility];  Service: General;  Laterality: N/A;   LUNG REMOVAL, PARTIAL Right 2004   was fungal and not cancerous   PROSTATE SURGERY     UMBILICAL HERNIA REPAIR N/A 02/25/2016   Procedure: LAPAROSCOPIC UMBILICAL HERNIA REPAIR;  Surgeon: Rolm Bookbinder, MD;  Location: [facility];  Service: General;  Laterality: N/A;   Patient Active Problem List   Diagnosis Date Noted   Primary osteoarthritis, right ankle and foot    Lower extremity edema 08/10/2019   Acute DVT (deep venous thrombosis) ([place]) 07/19/2019   History of pulmonary embolism 07/19/2019   Parkinson's disease ([place]) 02/03/2018   Community acquired pneumonia of left lower lobe of lung 12/23/2017   Sepsis ([place]) 12/23/2017   Morbid obesity due to excess calories ([place]) 08/23/2016   OSA on CPAP 08/23/2016   Dyspnea on exertion 41/32/4401   Umbilical hernia 02/72/5366   Persistent atrial fibrillation ([place])    Encounter for therapeutic drug monitoring 08/12/2013   Long term current use of anticoagulant 07/30/2010   COLONIC POLYPS 06/03/2010   Factor V Leiden ([place]) 06/03/2010   GERD 06/03/2010   HIATAL HERNIA 06/03/2010  BENIGN PROSTATIC HYPERTROPHY, WITH OBSTRUCTION 06/03/2010   PSORIASIS 06/03/2010    REFERRING DIAG: G20 (ICD-10-CM) - Primary parkinsonism ([place])    THERAPY DIAG:  Other abnormalities of gait and mobility  Unsteadiness on feet  Abnormal posture  PERTINENT HISTORY:  Parkinsonism, 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    PRECAUTIONS: Fall  SUBJECTIVE: Pt reports no falls or pain. R little toe is feeling better, wearing tennis shoes today. Did not do any exercises over the weekend.   PAIN:  Are you having pain? No      TODAY'S  TREATMENT:  Ther Act  STG Assessment    [facility] PT Assessment - 11/05/21 0855       Timed Up and Go Test   Normal TUG (seconds) 10.82   No AD, mild freezing w/turn   Manual TUG (seconds) 11.56   no freezing w/initiation of gait, minor freezing with turn           NMR  In // bars for improved lateral weight shifting, heel strike practice and eccentric quad control: -Fwd alt eccentric heel taps from 4" step, 2x10 per side w/BUE support. Mod verbal cues to slow down as pt tends to rush through movement. Noted increased difficulty lowering LLE > RLE.  -Lateral alt. Eccentric heel taps from 4" step w/BUE support, x20 per side w/BUE support. Min cues to maintain DF of bilat feet to tap w/heel.  -Standing parallel to rebounder on R side, alt. Fwd lunge w/2kg ball throw and catch to rebounder for improved stepping strategy, step length, rotation and dual-tasking. Pt performed x10 reps per side w/min A. Noted significant difficulty w/posterior step of LLE and significant freezing throughout. Mod cues for larger steps and tactile cues provided to pelvis to facilitate lateral weight shift. Pt demonstrated reduced amplitude of fwd step w/RLE > LLE.    GAIT: Gait pattern: step through pattern, decreased arm swing- Right, decreased arm swing- Left, decreased step length- Left, decreased stance time- Right, decreased stride length, decreased ankle dorsiflexion- Right, and shuffling Distance walked: Various clinic distances and into doorways, >300' outdoors on sidewalk  Assistive device utilized: U-step walker  Level of assistance: supervision  Comments: pt with more fluid steps today, no freezing noted with gait today on straightaway paths. Practiced ambulating through doorways, as pt reports this has been a trigger for his freezing recently. No freezing noted during session but educated pt to stop, reset and take one large step into doorway to combat freezing. Ambulated outdoors on sidewalk to practice  improved step clearance, mod cues provided for heel strike for improved efficiency of gait and reduction of shuffling.   PATIENT EDUCATION: Education details: STG outcomes, continue HEP  Person educated: Patient Education method: Explanation and handout Education comprehension: verbalized understanding and needs further education      HOME EXERCISE PROGRAM:  Seated PWR moves   Access Code: [email or web address] URL: [email or web address] Date: 10/18/2021 Prepared by: Mickie Bail Diangelo Radel  Exercises - Sit to Stand Without Arm Support  - 1 x daily - 7 x weekly - 3 sets - 10 reps - Proper Sit to Stand Technique  - 1 x daily - 7 x weekly - 3 sets - 10 reps - Seated Shoulder Diagonal Pulls with Resistance  - 1 x daily - 7 x weekly - 3  sets - 10 reps - Lateral Weight Shift with Arm Raise  - 1 x daily - 7 x weekly - 3 sets - 10 reps - Standing Quarter Turn with Counter Support  - 1 x daily - 7 x weekly - 3 sets - 10 reps       GOALS: Goals reviewed with patient? Yes   SHORT TERM GOALS: Target date: 11/07/2021   Pt will be independent with initial HEP for improved flexibility, strength, balance, transfers, and gait.     Baseline: Goal status: MET    2.  Pt will verbalize/demo understanding of tips to reduce festination with turns and gait.  Baseline:  Goal status: MET    3.  Pt will perform 10 reps of sit <>stands with supervision and BUE support without episodes of BLE  bracing in order to demo improved safety w/ transfers Baseline:  Goal status: MET   4.  Pt will undergo further assessment of miniBEST w/ LTG written. Baseline: 17/28 Goal status: MET   5.  Pt will decr manual TUG to 15 seconds or less for decr fall risk.  Baseline: 17.19 seconds, festination w/ initiation of gait; 11.56s, minor freezing w/turn  Goal status: MET     LONG TERM GOALS: Target date: 12/05/2021   Pt will be independent with final HEP for improved flexibility, strength, balance, transfers, and gait.    Baseline:  Goal status: INITIAL   2.  Pt will improve MiniBest to 22/28 for decreased fall risk and improvement with compensatory stepping strategies.   Baseline: 17/28 Goal status: INITIAL   3.  Pt will decr cog TUG to 16 seconds or less for decr fall risk.  Baseline: 18.09 seconds, freezing with turns  Goal status: INITIAL   4.  Pt will improve gait speed with U-Step Walker to at least 2.7 ft/sec in order to demo improved community mobility.    Baseline: 2.38 ft/sec with U-step Walker Goal status: INITIAL     ASSESSMENT:   CLINICAL IMPRESSION: Emphasis of skilled PT session on STG assessment,  Pt has met 5 of 5 STGs, demonstrating improvement in sit <>stands and reduction in freezing w/turns during TUG. Pt reported freezing more frequently when ambulating in/out of doorways, but was unable to reproduce freezing episodes in clinic. Pt able to teach back strategies for freezing management. Pt continues to demonstrate difficulty with large amplitude and posterior stepping. Continue POC.     OBJECTIVE IMPAIRMENTS Abnormal gait, decreased activity tolerance, decreased balance, decreased coordination, decreased knowledge of use of DME, difficulty walking, decreased strength, impaired flexibility, postural dysfunction, and pain.    ACTIVITY LIMITATIONS community activity.    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      REHAB POTENTIAL: Good   CLINICAL DECISION MAKING: Evolving/moderate complexity   EVALUATION COMPLEXITY: Moderate   PLAN: PT FREQUENCY: 2x/week   PT DURATION: 12 weeks   PLANNED INTERVENTIONS: Therapeutic exercises, Therapeutic activity, Neuromuscular  re-education, Balance training, Gait training, Patient/Family education, and DME instructions   PLAN FOR NEXT SESSION:  Review seated PWR moves. Work on A/P stepping, review festination and freezing strategies, sit<>stand functional strengthening and proper technique, turns. Obstacle navigation, resisted walking, lunges at [employer], PT, DPT 11/05/21, 10:12 AM

## 2021-11-06 DIAGNOSIS — M542 Cervicalgia: Secondary | ICD-10-CM | POA: Diagnosis not present

## 2021-11-06 DIAGNOSIS — G44209 Tension-type headache, unspecified, not intractable: Secondary | ICD-10-CM | POA: Diagnosis not present

## 2021-11-06 DIAGNOSIS — M47812 Spondylosis without myelopathy or radiculopathy, cervical region: Secondary | ICD-10-CM | POA: Diagnosis not present

## 2021-11-06 DIAGNOSIS — M9901 Segmental and somatic dysfunction of cervical region: Secondary | ICD-10-CM | POA: Diagnosis not present

## 2021-11-07 ENCOUNTER — Ambulatory Visit: Payer: PPO | Admitting: Physical Therapy

## 2021-11-07 ENCOUNTER — Encounter: Payer: Self-pay | Admitting: Physical Therapy

## 2021-11-07 ENCOUNTER — Ambulatory Visit: Payer: PPO | Admitting: Speech Pathology

## 2021-11-07 DIAGNOSIS — R2689 Other abnormalities of gait and mobility: Secondary | ICD-10-CM

## 2021-11-07 DIAGNOSIS — R2681 Unsteadiness on feet: Secondary | ICD-10-CM

## 2021-11-07 DIAGNOSIS — R471 Dysarthria and anarthria: Secondary | ICD-10-CM

## 2021-11-07 DIAGNOSIS — M6281 Muscle weakness (generalized): Secondary | ICD-10-CM

## 2021-11-07 DIAGNOSIS — R293 Abnormal posture: Secondary | ICD-10-CM

## 2021-11-07 NOTE — Therapy (Signed)
OUTPATIENT SPEECH LANGUAGE PATHOLOGY TREATMENT NOTE   Patient Name: Stanley Wright MRN: [id number] DOB:1945/08/10, 76 y.o., male Today's Date: 11/07/2021  PCP: Scifres, Dorthy, PA-C REFERRING PROVIDER: Ludwig Clarks, DO   END OF SESSION:   End of Session - 11/07/21 0751     Visit Number 8    Number of Visits 17    Date for SLP Re-Evaluation 12/05/21    Authorization Type [employer]    SLP Start Time 0845    SLP Stop Time  0924    SLP Time Calculation (min) 39 min    Activity Tolerance Patient tolerated treatment well              Past Medical History:  Diagnosis Date   Arthritis    BENIGN PROSTATIC HYPERTROPHY, WITH OBSTRUCTION    Cancer ([place])    skin, basal, squamous   COLONIC POLYPS    Diverticulitis    DVT (deep venous thrombosis) ([place]) 2000   Dysrhythmia    Hx Afib- 2017   Early cataract    Factor 5 Leiden mutation, heterozygous ([place])    GERD (gastroesophageal reflux disease)    not on medication   Hearing aid worn    B/L   HIATAL HERNIA    ITP (idiopathic thrombocytopenic purpura) 2003   Long term current use of anticoagulant    Osteoarthritis    right foot   Parkinson's disease ([place]) 02/03/2018   PSORIASIS    Pulmonary embolus ([place]) 2000   Shortness of breath dyspnea    at times - Talking alot   Sleep apnea    Wears glasses    Past Surgical History:  Procedure Laterality Date   ANKLE FUSION Right 08/24/2019   Procedure: RIGHT TALO-NAVICULAR FUSION;  Surgeon: Newt Minion, MD;  Location: [facility];  Service: Orthopedics;  Laterality: Right;   CARDIOVERSION N/A 11/22/2015   Procedure: CARDIOVERSION;  Surgeon: Sanda Klein, MD;  Location: [facility];  Service: Cardiovascular;  Laterality: N/A;   COLONOSCOPY     HERNIA REPAIR Left    Inguinal- x2 . mesh x1   INSERTION OF MESH N/A 02/25/2016   Procedure: INSERTION OF MESH;  Surgeon: Rolm Bookbinder, MD;  Location: [facility];  Service: General;  Laterality: N/A;   LUNG REMOVAL, PARTIAL  Right 2004   was fungal and not cancerous   PROSTATE SURGERY     UMBILICAL HERNIA REPAIR N/A 02/25/2016   Procedure: LAPAROSCOPIC UMBILICAL HERNIA REPAIR;  Surgeon: Rolm Bookbinder, MD;  Location: [facility];  Service: General;  Laterality: N/A;   Patient Active Problem List   Diagnosis Date Noted   Primary osteoarthritis, right ankle and foot    Lower extremity edema 08/10/2019   Acute DVT (deep venous thrombosis) ([place]) 07/19/2019   History of pulmonary embolism 07/19/2019   Parkinson's disease ([place]) 02/03/2018   Community acquired pneumonia of left lower lobe of lung 12/23/2017   Sepsis ([place]) 12/23/2017   Morbid obesity due to excess calories ([place]) 08/23/2016   OSA on CPAP 08/23/2016   Dyspnea on exertion 03/47/4259   Umbilical hernia 56/38/7564   Persistent atrial fibrillation ([place])    Encounter for therapeutic drug monitoring 08/12/2013   Long term current use of anticoagulant 07/30/2010   COLONIC POLYPS 06/03/2010   Factor V Leiden ([place]) 06/03/2010   GERD 06/03/2010   HIATAL HERNIA 06/03/2010   BENIGN PROSTATIC HYPERTROPHY, WITH OBSTRUCTION 06/03/2010   PSORIASIS 06/03/2010    ONSET DATE: referred 10/01/2021, dx with Parkinsonism Sept 2019   REFERRING  DIAG: G20 (ICD-10-CM) - Primary parkinsonism ([place])   THERAPY DIAG:  Dysarthria and anarthria  SUBJECTIVE: "I was paying my bills"  PAIN:  Are you having pain? No Description: n/a  OBJECTIVE:   TODAY'S TREATMENT:  11-07-21: Pt reports doing "some" home practice. Does his warm up exercises and counting. Education on recommended home practice BID of full lesson of speak out. Target increasing vocal intensity through Speak Out program, lesson 9. Without model, able to meet dB targets, with clear vocal quality, for warm up exercises. Usual mod-A for maintaining vocal intensity, pacing, and avoiding speaking on residual air during reading and cognitive exercises. Benefits from intermittent modeling. Little carryover to  conversational sample following structured practice.  Loud ah: 89 dB Counting: 74 dB Reading (sentences): 72 dB Cognitive Ex: 68 dB Conversational sample: 67 dB  11-05-21: Pt reports minimal home practice since last session. Enters session with decreased volume and intelligibility. Led pt through [employer]! Lesson 8. Rare min-A for warm up exercises. Occasional mod-A to meet average dB for reading and cognitive exercises. Increasing awareness of use or lack of intent during structured practice. Limited carryover this date to conversational, spontaneous utterances, despite usual max-A. Loud ah: 86 dB Counting: 76 dB Reading (sentences): 75 dB Cognitive Ex: 71 dB  PATIENT EDUCATION: Education details: see above Person educated: Patient Education method: Explanation, Demonstration, and Handouts Education comprehension: verbalized understanding, returned demonstration, and needs further education     HOME EXERCISE PROGRAM: Speak Out! Lesson 10, 11, 12   GOALS: Goals reviewed with patient? Yes   SHORT TERM GOALS: Target date: 11/07/2021   Pt will complete dysarthria HEP with mod-I over 2 sessions Baseline: 10-31-2021, 11-05-21 Goal status: met   2.  Pt will average 85 dB in x5 loud "ah" production with rare min-A over 2 sessions Baseline: 10-31-2021, 11-05-21 Goal status: met   3.  Pt will exceed 72 dB in 80% of oral reading trials with usual mod-A over 2 sessions Baseline: 11-05-21, 11-07-21 Goal status: met   4.  Pt will meet dB targets for Speak Out! cognitive exercise (72+ dB) in 80% of trials with usual mod-A over 2 sessions  Baseline: 11-05-21, 11-05-21 Goal status: partially met     LONG TERM GOALS: Target date: 12/05/2021   Pt will report improved voice related QOL via V-QROL by 10 points (calculated score) by d/c.  Baseline: 67.5 calculated score Goal status: ongoing   2.  Pt will average 70+ dB in 10 minute conversational sample with rare min-A over 2 sessions.   Baseline:  Goal status: ongoing   3.  Pt will report completion of dysarthria HEP, with perceived benefit, at least 1x per day (recommend BID) over 1 week period.  Baseline:  Goal status: ongoing   4.  Pt will average 72+ dB in oral reading trials, paragraph level with rare min-A over 2 sessions Baseline:  Goal status: ongoing   5.  Pt will meet dB targets for Speak Out! cognitive exercise (72+ dB) in 80% of trials with rare min-A over 2 sessions  Baseline:  Goal status: ongoing   ASSESSMENT:   CLINICAL IMPRESSION: Patient is a 76 y.o. 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 Ongoing education and structured practice using intent to elevate pt's communicative effectiveness. ST has initiated training with Speak Out! Program with pt currently requiring usual min-A for meeting dB targets, usual mod-A for pacing to prevent rushes of speech, and increased cueing and support for carryover to conversational  speech. Increasing awareness of decreased loudness throughout sessions. Recommend continued skilled ST.   OBJECTIVE IMPAIRMENTS  Objective impairments include dysarthria. These impairments are limiting patient from effectively communicating at home and in community.Factors affecting potential to achieve goals and functional outcome are medical prognosis.. Patient will benefit from skilled SLP services to address above impairments and improve overall function.   REHAB POTENTIAL: Good   PLAN: SLP FREQUENCY: 2x/week   SLP DURATION: 8 weeks   PLANNED INTERVENTIONS: Cueing hierachy, Internal/external aids, Functional tasks, SLP instruction and feedback, Compensatory strategies, and Patient/family education  Su Monks, CCC-SLP 11/07/2021, 9:24 AM

## 2021-11-07 NOTE — Therapy (Signed)
OUTPATIENT PHYSICAL THERAPY TREATMENT NOTE   Patient Name: Stanley Wright MRN: [id number] DOB:[birth year], 76 y.o., male Today's Date: 11/07/2021  PCP: Maude Leriche, PA-C  REFERRING PROVIDER: Ludwig Clarks, DO   END OF SESSION:   PT End of Session - 11/07/21 0803     Visit Number 7    Number of Visits 13    Date for PT Re-Evaluation 01/08/22   due to delay in scheduling   Authorization Type [employer]    PT Start Time 0802    PT Stop Time 0842    PT Time Calculation (min) 40 min    Equipment Utilized During Treatment Gait belt    Activity Tolerance Patient tolerated treatment well    Behavior During Therapy WFL for tasks assessed/performed               Past Medical History:  Diagnosis Date   Arthritis    BENIGN PROSTATIC HYPERTROPHY, WITH OBSTRUCTION    Cancer ([place])    skin, basal, squamous   COLONIC POLYPS    Diverticulitis    DVT (deep venous thrombosis) ([place]) 2000   Dysrhythmia    Hx Afib- 2017   Early cataract    Factor 5 Leiden mutation, heterozygous ([place])    GERD (gastroesophageal reflux disease)    not on medication   Hearing aid worn    B/L   HIATAL HERNIA    ITP (idiopathic thrombocytopenic purpura) 2003   Long term current use of anticoagulant    Osteoarthritis    right foot   Parkinson's disease ([place]) 02/03/2018   PSORIASIS    Pulmonary embolus ([place]) 2000   Shortness of breath dyspnea    at times - Talking alot   Sleep apnea    Wears glasses    Past Surgical History:  Procedure Laterality Date   ANKLE FUSION Right 08/24/2019   Procedure: RIGHT TALO-NAVICULAR FUSION;  Surgeon: Newt Minion, MD;  Location: [place];  Service: Orthopedics;  Laterality: Right;   CARDIOVERSION N/A 11/22/2015   Procedure: CARDIOVERSION;  Surgeon: Sanda Klein, MD;  Location: [place];  Service: Cardiovascular;  Laterality: N/A;   COLONOSCOPY     HERNIA REPAIR Left    Inguinal- x2 . mesh x1   INSERTION OF MESH N/A 02/25/2016   Procedure:  INSERTION OF MESH;  Surgeon: Rolm Bookbinder, MD;  Location: [place];  Service: General;  Laterality: N/A;   LUNG REMOVAL, PARTIAL Right 2004   was fungal and not cancerous   PROSTATE SURGERY     UMBILICAL HERNIA REPAIR N/A 02/25/2016   Procedure: LAPAROSCOPIC UMBILICAL HERNIA REPAIR;  Surgeon: Rolm Bookbinder, MD;  Location: [place];  Service: General;  Laterality: N/A;   Patient Active Problem List   Diagnosis Date Noted   Primary osteoarthritis, right ankle and foot    Lower extremity edema 08/10/2019   Acute DVT (deep venous thrombosis) ([place]) 07/19/2019   History of pulmonary embolism 07/19/2019   Parkinson's disease ([place]) 02/03/2018   Community acquired pneumonia of left lower lobe of lung 12/23/2017   Sepsis ([place]) 12/23/2017   Morbid obesity due to excess calories ([place]) 08/23/2016   OSA on CPAP 08/23/2016   Dyspnea on exertion 40/98/1191   Umbilical hernia 47/82/9562   Persistent atrial fibrillation ([place])    Encounter for therapeutic drug monitoring 08/12/2013   Long term current use of anticoagulant 07/30/2010   COLONIC POLYPS 06/03/2010   Factor V Leiden ([place]) 06/03/2010   GERD 06/03/2010   HIATAL HERNIA 06/03/2010  BENIGN PROSTATIC HYPERTROPHY, WITH OBSTRUCTION 06/03/2010   PSORIASIS 06/03/2010    REFERRING DIAG: G20 (ICD-10-CM) - Primary parkinsonism ([place])    THERAPY DIAG:  Other abnormalities of gait and mobility  Unsteadiness on feet  Abnormal posture  Muscle weakness (generalized)  PERTINENT HISTORY:  Parkinsonism, 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    PRECAUTIONS: Fall  SUBJECTIVE: Nothing new. No falls. Feels like he is doing better with the strategies when freezing.   PAIN:  Are you having pain? No      TODAY'S TREATMENT:     Therapeutic Exercise:  Scifit with BUE/BLE level 3.0 x 8 minutes with goal >/= 70 steps for strengthening, reciprocal movement patterns and activity tolerance.     NMR  At countertop for improved weight shifting, balance, foot clearance -Staggered stance A/P weight shifting x15 reps each side starting with feet only and then adding in reciprocal UE arm swing. Unable to shift weight forwards onto R foot and lift heel, but pt did well with keeping foot on the ground. Began with UE support > none, with cues for incr weight shift and incr amplitude of UE arm movement. -Wide BOS lateral weight shifting with reaching up towards sticky note x8 reps each side, progressed to standing on blue foam pad x10 reps each side, with trying to perform without UE support. Min guard as needed for balance on a compliant surface. Cues to hold for a couple seconds when rocking and reaching/looking up.  -Posterior/lateral diagonal stepping to colorful floor dot as visual cue with single UE support x12 reps each side, incr difficulty with stepping with LLE, esp when back to midline, taking a couple steps to step foot back to midline. Cues for weight shifting when stepping foot out, and to use shifting weight back to other leg to step foot big back to midline.  -Lateral step and reach towards dot as floor target x10 reps, with yardstick on floor to help improve foot clearance, pt with improved foot clearance in this direction and no difficulty stepping LLE back to midline.  -Retro gait at countertop x5 reps, 20+ steps during first attempt, in subsequent attempts pt able to perform in 10-12 steps. When turning, cued for weight shifting and marching turns.     GAIT: Gait pattern: step through pattern, decreased arm swing- Right, decreased arm swing- Left, decreased step length- Left, decreased stance time- Right, decreased stride length, decreased ankle dorsiflexion- Right, and shuffling Distance walked: 115' x 2  Level  of assistance: supervision  Comments: Intermittent cues for incr foot clearance and heel strike. Pt needs cues when initially in standing to perform rocking first and then taking a step to decr initiation freezing/festination episode.   PATIENT EDUCATION: Education details: Continue with HEP  Person educated: Patient Education method: Explanation and handout Education comprehension: verbalized understanding and needs further education      HOME EXERCISE PROGRAM:  Seated PWR moves   Access Code: [email or web address] URL: [email or web address] Date: 10/18/2021 Prepared by: Mickie Bail Plaster  Exercises - Sit to Stand Without Arm Support  - 1 x daily - 7 x weekly - 3 sets - 10 reps - Proper Sit to Stand Technique  - 1 x daily - 7 x weekly - 3 sets - 10 reps - Seated Shoulder Diagonal Pulls with Resistance  -  1 x daily - 7 x weekly - 3 sets - 10 reps - Lateral Weight Shift with Arm Raise  - 1 x daily - 7 x weekly - 3 sets - 10 reps - Standing Quarter Turn with Counter Support  - 1 x daily - 7 x weekly - 3 sets - 10 reps       GOALS: Goals reviewed with patient? Yes   SHORT TERM GOALS: Target date: 11/07/2021   Pt will be independent with initial HEP for improved flexibility, strength, balance, transfers, and gait.     Baseline: Goal status: MET    2.  Pt will verbalize/demo understanding of tips to reduce festination with turns and gait.  Baseline:  Goal status: MET    3.  Pt will perform 10 reps of sit <>stands with supervision and BUE support without episodes of BLE  bracing in order to demo improved safety w/ transfers Baseline:  Goal status: MET   4.  Pt will undergo further assessment of miniBEST w/ LTG written. Baseline: 17/28 Goal status: MET   5.  Pt will decr manual TUG to 15 seconds or less for decr fall risk.  Baseline: 17.19 seconds, festination w/ initiation of gait; 11.56s, minor freezing w/turn  Goal status: MET     LONG TERM GOALS: Target date:  12/05/2021   Pt will be independent with final HEP for improved flexibility, strength, balance, transfers, and gait.   Baseline:  Goal status: INITIAL   2.  Pt will improve MiniBest to 22/28 for decreased fall risk and improvement with compensatory stepping strategies.   Baseline: 17/28 Goal status: INITIAL   3.  Pt will decr cog TUG to 16 seconds or less for decr fall risk.  Baseline: 18.09 seconds, freezing with turns  Goal status: INITIAL   4.  Pt will improve gait speed with U-Step Walker to at least 2.7 ft/sec in order to demo improved community mobility.    Baseline: 2.38 ft/sec with U-step Walker Goal status: INITIAL     ASSESSMENT:   CLINICAL IMPRESSION:  Today's skilled session focused on weight shifting and balance tasks. Pt challenged by posterior and lateral stepping tasks in the diagonal direction, pt with incr difficulty with stepping LLE back to midline, takes 1-2 steps to bring it back. Initially when performing retro gait at counter, pt performing in 20+ steps, when cued for larger step length, pt able to perform in 10-12 steps. Continue per POC.     OBJECTIVE IMPAIRMENTS Abnormal gait, decreased activity tolerance, decreased balance, decreased coordination, decreased knowledge of use of DME, difficulty walking, decreased strength, impaired flexibility, postural dysfunction, and pain.    ACTIVITY LIMITATIONS community activity.    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      REHAB POTENTIAL: Good   CLINICAL DECISION MAKING: Evolving/moderate complexity   EVALUATION COMPLEXITY: Moderate   PLAN: PT FREQUENCY: 2x/week   PT DURATION: 12 weeks   PLANNED  INTERVENTIONS: Therapeutic exercises, Therapeutic activity, Neuromuscular re-education, Balance training, Gait training, Patient/Family education, and DME instructions   PLAN FOR NEXT SESSION:  Review seated PWR moves. Work on A/P stepping, review festination and freezing strategies, sit<>stand functional strengthening and proper technique, turns. Obstacle navigation, resisted walking, lunges at [employer], PT,  DPT 11/07/21 12:52 PM

## 2021-11-13 DIAGNOSIS — G44209 Tension-type headache, unspecified, not intractable: Secondary | ICD-10-CM | POA: Diagnosis not present

## 2021-11-13 DIAGNOSIS — M47812 Spondylosis without myelopathy or radiculopathy, cervical region: Secondary | ICD-10-CM | POA: Diagnosis not present

## 2021-11-13 DIAGNOSIS — M542 Cervicalgia: Secondary | ICD-10-CM | POA: Diagnosis not present

## 2021-11-13 DIAGNOSIS — M9901 Segmental and somatic dysfunction of cervical region: Secondary | ICD-10-CM | POA: Diagnosis not present

## 2021-11-15 ENCOUNTER — Ambulatory Visit: Payer: PPO | Attending: Neurology | Admitting: Physical Therapy

## 2021-11-15 ENCOUNTER — Ambulatory Visit: Payer: PPO

## 2021-11-15 DIAGNOSIS — M6281 Muscle weakness (generalized): Secondary | ICD-10-CM | POA: Diagnosis not present

## 2021-11-15 DIAGNOSIS — R2689 Other abnormalities of gait and mobility: Secondary | ICD-10-CM | POA: Insufficient documentation

## 2021-11-15 DIAGNOSIS — Z9181 History of falling: Secondary | ICD-10-CM | POA: Insufficient documentation

## 2021-11-15 DIAGNOSIS — R471 Dysarthria and anarthria: Secondary | ICD-10-CM

## 2021-11-15 DIAGNOSIS — R293 Abnormal posture: Secondary | ICD-10-CM | POA: Diagnosis not present

## 2021-11-15 DIAGNOSIS — R2681 Unsteadiness on feet: Secondary | ICD-10-CM | POA: Insufficient documentation

## 2021-11-15 DIAGNOSIS — R29818 Other symptoms and signs involving the nervous system: Secondary | ICD-10-CM | POA: Insufficient documentation

## 2021-11-15 NOTE — Therapy (Signed)
OUTPATIENT PHYSICAL THERAPY TREATMENT NOTE   Patient Name: Stanley Wright MRN: [id number] DOB:November 23, 1945, 76 y.o., male Today's Date: 11/15/2021  PCP: Maude Leriche, PA-C  REFERRING PROVIDER: Ludwig Clarks, DO   END OF SESSION:   PT End of Session - 11/15/21 0849     Visit Number 8    Number of Visits 13    Date for PT Re-Evaluation 01/08/22   due to delay in scheduling   Authorization Type [employer]    PT Start Time 0848    PT Stop Time 0926    PT Time Calculation (min) 38 min    Equipment Utilized During Treatment Gait belt    Activity Tolerance Patient tolerated treatment well    Behavior During Therapy WFL for tasks assessed/performed                Past Medical History:  Diagnosis Date   Arthritis    BENIGN PROSTATIC HYPERTROPHY, WITH OBSTRUCTION    Cancer ([place])    skin, basal, squamous   COLONIC POLYPS    Diverticulitis    DVT (deep venous thrombosis) ([place]) 2000   Dysrhythmia    Hx Afib- 2017   Early cataract    Factor 5 Leiden mutation, heterozygous ([place])    GERD (gastroesophageal reflux disease)    not on medication   Hearing aid worn    B/L   HIATAL HERNIA    ITP (idiopathic thrombocytopenic purpura) 2003   Long term current use of anticoagulant    Osteoarthritis    right foot   Parkinson's disease ([place]) 02/03/2018   PSORIASIS    Pulmonary embolus ([place]) 2000   Shortness of breath dyspnea    at times - Talking alot   Sleep apnea    Wears glasses    Past Surgical History:  Procedure Laterality Date   ANKLE FUSION Right 08/24/2019   Procedure: RIGHT TALO-NAVICULAR FUSION;  Surgeon: Newt Minion, MD;  Location: [place];  Service: Orthopedics;  Laterality: Right;   CARDIOVERSION N/A 11/22/2015   Procedure: CARDIOVERSION;  Surgeon: Sanda Klein, MD;  Location: [facility];  Service: Cardiovascular;  Laterality: N/A;   COLONOSCOPY     HERNIA REPAIR Left    Inguinal- x2 . mesh x1   INSERTION OF MESH N/A 02/25/2016   Procedure:  INSERTION OF MESH;  Surgeon: Rolm Bookbinder, MD;  Location: [facility];  Service: General;  Laterality: N/A;   LUNG REMOVAL, PARTIAL Right 2004   was fungal and not cancerous   PROSTATE SURGERY     UMBILICAL HERNIA REPAIR N/A 02/25/2016   Procedure: LAPAROSCOPIC UMBILICAL HERNIA REPAIR;  Surgeon: Rolm Bookbinder, MD;  Location: [place];  Service: General;  Laterality: N/A;   Patient Active Problem List   Diagnosis Date Noted   Primary osteoarthritis, right ankle and foot    Lower extremity edema 08/10/2019   Acute DVT (deep venous thrombosis) ([place]) 07/19/2019   History of pulmonary embolism 07/19/2019   Parkinson's disease ([place]) 02/03/2018   Community acquired pneumonia of left lower lobe of lung 12/23/2017   Sepsis ([place]) 12/23/2017   Morbid obesity due to excess calories ([place]) 08/23/2016   OSA on CPAP 08/23/2016   Dyspnea on exertion 45/08/8880   Umbilical hernia 80/08/4915   Persistent atrial fibrillation ([place])    Encounter for therapeutic drug monitoring 08/12/2013   Long term current use of anticoagulant 07/30/2010   COLONIC POLYPS 06/03/2010   Factor V Leiden ([place]) 06/03/2010   GERD 06/03/2010   HIATAL HERNIA 06/03/2010  BENIGN PROSTATIC HYPERTROPHY, WITH OBSTRUCTION 06/03/2010   PSORIASIS 06/03/2010    REFERRING DIAG: G20 (ICD-10-CM) - Primary parkinsonism ([place])    THERAPY DIAG:  Unsteadiness on feet  Other abnormalities of gait and mobility  Abnormal posture  PERTINENT HISTORY:  Parkinsonism, 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    PRECAUTIONS: Fall  SUBJECTIVE: Pt reports he fell while walking into store two days ago, fell onto his R elbow and is scuffed up but no major injuries. Was able to get back up by himself. Almost fell this morning while  putting air in his tires in his garage. "I had a good opportunity to fall but I didn't". No other changes. Exercises are going well.   PAIN:  Are you having pain? No     TODAY'S TREATMENT:   Therapeutic Exercise:  Scifit multi-peaks level 6 for 8 minutes with BUE/BLEs for neural priming for reciprocal movement, increased amplitude of stepping and cardiovascular conditioning.   NMR   RAMP:  Level of Assistance: Min A Assistive device utilized: None Ramp Comments: Practiced ascending/descending ramp x2 without AD, as this is what caused pt to fall 2 days ago. Noted significant increase in festination and freezing today, mod cues for techniques to work through freezing but pt stated he was in an off period and popped a Sinemet out of his pocket into his mouth. Noted increased difficulty descending > ascending due to shuffling gait and festination    In // bars for improved lateral weight shifting, ankle strategy, vestibular function and single leg stability:  -On rockerboard in A/P direction, static standing using mirror for visual biofeedback on midline orientation and reduction of anterior lean. Pt initially required BUE support and slowly progressed to unilateral UE support and finally no UE support. CGA-min A throughout for anterior lean correction, tactile cues provided at pelvis and anterior shoulder for proper positioning  -Progressed to EC on rockerboard w/no UE support, pt frequently losing balance anteriorly requiring min A to correct. Same tactile cues provided as listed above.  -Progressed to alt cone taps while standing on rockerboard, initially with BUE support and progressed to no UE support. Pt with notable difficulty tapping cone w/ LLE > RLE. Noted pt losing balance posteriorly and laterally throughout, requiring min-mod A for stabilization in frontal and sagittal planes. Pt able to complete x5 taps per side without UE support but frequently relied on UE support to complete task.   -Switched board to L/R direction and practiced static standing without UE support, pt tending to favor R side. Progressed to lateral weight shifts and pt frequently overcorrecting and hitting hips into // bars. Lastly stood on board w/EC in L/R direction without UE support, min guard provided. Pt able to stabilize more effectively with EC vs EO.    GAIT: Gait pattern: step through pattern, decreased arm swing- Right, decreased arm swing- Left, decreased step length- Left, decreased stance time- Right, decreased stride length, decreased ankle dorsiflexion- Right, and shuffling Distance walked: Various clinic distances  Level of assistance: supervision  Comments: Intermittent cues for incr foot clearance and heel strike. Pt needs cues when initially in standing to perform rocking first and then taking a step to decr initiation freezing/festination episode.   PATIENT EDUCATION: Education details: Continue with HEP, using AD at all times to  reduce risk of falls   Person educated: Patient Education method: Explanation and handout Education comprehension: verbalized understanding and needs further education      HOME EXERCISE PROGRAM:  Seated PWR moves   Access Code: [email or web address] URL: [email or web address] Date: 10/18/2021 Prepared by: Mickie Bail Derian Pfost  Exercises - Sit to Stand Without Arm Support  - 1 x daily - 7 x weekly - 3 sets - 10 reps - Proper Sit to Stand Technique  - 1 x daily - 7 x weekly - 3 sets - 10 reps - Seated Shoulder Diagonal Pulls with Resistance  - 1 x daily - 7 x weekly - 3 sets - 10 reps - Lateral Weight Shift with Arm Raise  - 1 x daily - 7 x weekly - 3 sets - 10 reps - Standing Quarter Turn with Counter Support  - 1 x daily - 7 x weekly - 3 sets - 10 reps       GOALS: Goals reviewed with patient? Yes   SHORT TERM GOALS: Target date: 11/07/2021   Pt will be independent with initial HEP for improved flexibility, strength, balance, transfers, and gait.      Baseline: Goal status: MET    2.  Pt will verbalize/demo understanding of tips to reduce festination with turns and gait.  Baseline:  Goal status: MET    3.  Pt will perform 10 reps of sit <>stands with supervision and BUE support without episodes of BLE  bracing in order to demo improved safety w/ transfers Baseline:  Goal status: MET   4.  Pt will undergo further assessment of miniBEST w/ LTG written. Baseline: 17/28 Goal status: MET   5.  Pt will decr manual TUG to 15 seconds or less for decr fall risk.  Baseline: 17.19 seconds, festination w/ initiation of gait; 11.56s, minor freezing w/turn  Goal status: MET     LONG TERM GOALS: Target date: 12/05/2021   Pt will be independent with final HEP for improved flexibility, strength, balance, transfers, and gait.   Baseline:  Goal status: INITIAL   2.  Pt will improve MiniBest to 22/28 for decreased fall risk and improvement with compensatory stepping strategies.   Baseline: 17/28 Goal status: INITIAL   3.  Pt will decr cog TUG to 16 seconds or less for decr fall risk.  Baseline: 18.09 seconds, freezing with turns  Goal status: INITIAL   4.  Pt will improve gait speed with U-Step Walker to at least 2.7 ft/sec in order to demo improved community mobility.    Baseline: 2.38 ft/sec with U-step Walker Goal status: INITIAL     ASSESSMENT:   CLINICAL IMPRESSION: Emphasis of skilled PT session on ramp navigation, freezing techniques and improved vestibular input/ankle strategies for balance recovery. Pt reports he had a fall 2 days ago while walking up a ramp into a store and had an almost fall this morning in his garage.Pt did not have AD with him during either incident. Pt in an off period during session today, with increased freezing episodes in which pt moves impulsively to correct it. Pt will benefit from continued practice w/freezing strategies, especially in off periods, and vestibular function w/balance as pt states he  often has "my eyes closed when I move".   OBJECTIVE IMPAIRMENTS Abnormal gait, decreased activity tolerance, decreased balance, decreased coordination, decreased knowledge of use of DME, difficulty walking, decreased strength, impaired flexibility, postural dysfunction, and pain.    ACTIVITY LIMITATIONS community activity.    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 Doristine Devoid notes), cervical dystonia, a fib, R ankle fusion 2021,  arthritis,DVT, A-fib, GERD, sleep apnea   are also affecting patient's functional outcome.      REHAB POTENTIAL: Good   CLINICAL DECISION MAKING: Evolving/moderate complexity   EVALUATION COMPLEXITY: Moderate   PLAN: PT FREQUENCY: 2x/week   PT DURATION: 12 weeks   PLANNED INTERVENTIONS: Therapeutic exercises, Therapeutic activity, Neuromuscular re-education, Balance training, Gait training, Patient/Family education, and DME instructions   PLAN FOR NEXT SESSION:  Review seated PWR moves. Work on A/P stepping, review festination and freezing strategies, sit<>stand functional strengthening and proper technique, turns. Obstacle navigation, resisted walking, lunges at rebounder, balance on unlevel surfaces, ramps    Ravina Milner E Farzad Tibbetts, PT, DPT 11/15/21 10:05 AM

## 2021-11-15 NOTE — Therapy (Signed)
OUTPATIENT SPEECH LANGUAGE PATHOLOGY TREATMENT NOTE   Patient Name: Stanley Wright MRN: [id number] DOB:08-10-1945, 76 y.o., male Today's Date: 11/15/2021  PCP: Scifres, Dorthy, PA-C REFERRING PROVIDER: Ludwig Clarks, DO   END OF SESSION:   End of Session - 11/15/21 0750     Visit Number 9    Number of Visits 17    Date for SLP Re-Evaluation 12/05/21    Authorization Type [employer]    SLP Start Time 0800    SLP Stop Time  0845    SLP Time Calculation (min) 45 min    Activity Tolerance Patient tolerated treatment well              Past Medical History:  Diagnosis Date   Arthritis    BENIGN PROSTATIC HYPERTROPHY, WITH OBSTRUCTION    Cancer ([place])    skin, basal, squamous   COLONIC POLYPS    Diverticulitis    DVT (deep venous thrombosis) ([place]) 2000   Dysrhythmia    Hx Afib- 2017   Early cataract    Factor 5 Leiden mutation, heterozygous ([place])    GERD (gastroesophageal reflux disease)    not on medication   Hearing aid worn    B/L   HIATAL HERNIA    ITP (idiopathic thrombocytopenic purpura) 2003   Long term current use of anticoagulant    Osteoarthritis    right foot   Parkinson's disease ([place]) 02/03/2018   PSORIASIS    Pulmonary embolus ([place]) 2000   Shortness of breath dyspnea    at times - Talking alot   Sleep apnea    Wears glasses    Past Surgical History:  Procedure Laterality Date   ANKLE FUSION Right 08/24/2019   Procedure: RIGHT TALO-NAVICULAR FUSION;  Surgeon: Newt Minion, MD;  Location: [facility];  Service: Orthopedics;  Laterality: Right;   CARDIOVERSION N/A 11/22/2015   Procedure: CARDIOVERSION;  Surgeon: Sanda Klein, MD;  Location: [facility];  Service: Cardiovascular;  Laterality: N/A;   COLONOSCOPY     HERNIA REPAIR Left    Inguinal- x2 . mesh x1   INSERTION OF MESH N/A 02/25/2016   Procedure: INSERTION OF MESH;  Surgeon: Rolm Bookbinder, MD;  Location: [facility];  Service: General;  Laterality: N/A;   LUNG REMOVAL, PARTIAL Right  2004   was fungal and not cancerous   PROSTATE SURGERY     UMBILICAL HERNIA REPAIR N/A 02/25/2016   Procedure: LAPAROSCOPIC UMBILICAL HERNIA REPAIR;  Surgeon: Rolm Bookbinder, MD;  Location: [facility];  Service: General;  Laterality: N/A;   Patient Active Problem List   Diagnosis Date Noted   Primary osteoarthritis, right ankle and foot    Lower extremity edema 08/10/2019   Acute DVT (deep venous thrombosis) ([place]) 07/19/2019   History of pulmonary embolism 07/19/2019   Parkinson's disease ([place]) 02/03/2018   Community acquired pneumonia of left lower lobe of lung 12/23/2017   Sepsis ([place]) 12/23/2017   Morbid obesity due to excess calories ([place]) 08/23/2016   OSA on CPAP 08/23/2016   Dyspnea on exertion 63/14/9702   Umbilical hernia 63/78/5885   Persistent atrial fibrillation ([place])    Encounter for therapeutic drug monitoring 08/12/2013   Long term current use of anticoagulant 07/30/2010   COLONIC POLYPS 06/03/2010   Factor V Leiden ([place]) 06/03/2010   GERD 06/03/2010   HIATAL HERNIA 06/03/2010   BENIGN PROSTATIC HYPERTROPHY, WITH OBSTRUCTION 06/03/2010   PSORIASIS 06/03/2010    ONSET DATE: referred 10/01/2021, dx with Parkinsonism Sept 2019   REFERRING  DIAG: G20 (ICD-10-CM) - Primary parkinsonism ([place])   THERAPY DIAG: Dysarthria and anarthria  SUBJECTIVE: "Sometimes good, sometimes not so good" re: intentional speech  PAIN:  Are you having pain? No Description: n/a  OBJECTIVE:   TODAY'S TREATMENT:  11-15-21: Pt entered with sub-optimal volume averaging low 60s dB. Cued "intentional" voice and "speak out" did not improve vocal intensity. SLP modeling was largely ineffective. Targeted increasing vocal intensity through [employer], lesson 12. Without model, able to meet dB targets, with clear vocal quality, for warm up exercises. Occasional min to mod A for maintaining vocal intensity, pacing, and avoiding speaking on residual air during reading and cognitive exercises.  Minimal carryover to conversational sample following structured practice despite usual SLP cues, prompting, and modeling.  Loud ah: 89 dB (6-10 seconds) Counting: 78 dB Reading (sentences): 74 dB Cognitive Ex: 72 dB Conversational sample: 69 dB  11-07-21: Pt reports doing "some" home practice. Does his warm up exercises and counting. Education on recommended home practice BID of full lesson of speak out. Target increasing vocal intensity through Speak Out program, lesson 9. Without model, able to meet dB targets, with clear vocal quality, for warm up exercises. Usual mod-A for maintaining vocal intensity, pacing, and avoiding speaking on residual air during reading and cognitive exercises. Benefits from intermittent modeling. Little carryover to conversational sample following structured practice.  Loud ah: 89 dB Counting: 74 dB Reading (sentences): 72 dB Cognitive Ex: 68 dB Conversational sample: 67 dB  11-05-21: Pt reports minimal home practice since last session. Enters session with decreased volume and intelligibility. Led pt through [employer]! Lesson 8. Rare min-A for warm up exercises. Occasional mod-A to meet average dB for reading and cognitive exercises. Increasing awareness of use or lack of intent during structured practice. Limited carryover this date to conversational, spontaneous utterances, despite usual max-A. Loud ah: 86 dB Counting: 76 dB Reading (sentences): 75 dB Cognitive Ex: 71 dB  PATIENT EDUCATION: Education details: see above Person educated: Patient Education method: Explanation, Demonstration, and Handouts Education comprehension: verbalized understanding, returned demonstration, and needs further education     HOME EXERCISE PROGRAM: Speak Out! Lesson 12, 13, 14   GOALS: Goals reviewed with patient? Yes   SHORT TERM GOALS: Target date: 11/07/2021   Pt will complete dysarthria HEP with mod-I over 2 sessions Baseline: 10-31-2021, 11-05-21 Goal status: met    2.  Pt will average 85 dB in x5 loud "ah" production with rare min-A over 2 sessions Baseline: 10-31-2021, 11-05-21 Goal status: met   3.  Pt will exceed 72 dB in 80% of oral reading trials with usual mod-A over 2 sessions Baseline: 11-05-21, 11-07-21 Goal status: met   4.  Pt will meet dB targets for Speak Out! cognitive exercise (72+ dB) in 80% of trials with usual mod-A over 2 sessions  Baseline: 11-05-21, 11-05-21 Goal status: partially met     LONG TERM GOALS: Target date: 12/05/2021   Pt will report improved voice related QOL via V-QROL by 10 points (calculated score) by d/c.  Baseline: 67.5 calculated score Goal status: ongoing   2.  Pt will average 70+ dB in 10 minute conversational sample with rare min-A over 2 sessions.  Baseline:  Goal status: ongoing   3.  Pt will report completion of dysarthria HEP, with perceived benefit, at least 1x per day (recommend BID) over 1 week period.  Baseline:  Goal status: ongoing   4.  Pt will average 72+ dB in oral reading trials, paragraph  level with rare min-A over 2 sessions Baseline:  Goal status: ongoing   5.  Pt will meet dB targets for Speak Out! cognitive exercise (72+ dB) in 80% of trials with rare min-A over 2 sessions  Baseline:  Goal status: ongoing   ASSESSMENT:   CLINICAL IMPRESSION: Patient is a 76 y.o. 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 Ongoing education and structured practice using intent to elevate pt's communicative effectiveness. ST conducted ongoing training with Speak Out! Program with pt currently requiring occasional to usual min-A for meeting dB targets, usual mod-A for pacing to prevent rushes of speech, and increased cueing and support for carryover to conversational speech. Increasing awareness of decreased loudness throughout sessions. Recommend continued skilled ST.   OBJECTIVE IMPAIRMENTS  Objective impairments include dysarthria. These impairments are limiting patient from effectively  communicating at home and in community.Factors affecting potential to achieve goals and functional outcome are medical prognosis.. Patient will benefit from skilled SLP services to address above impairments and improve overall function.   REHAB POTENTIAL: Good   PLAN: SLP FREQUENCY: 2x/week   SLP DURATION: 8 weeks   PLANNED INTERVENTIONS: Cueing hierachy, Internal/external aids, Functional tasks, SLP instruction and feedback, Compensatory strategies, and Patient/family education  Marzetta Board, CCC-SLP 11/15/2021, 7:51 AM

## 2021-11-18 ENCOUNTER — Ambulatory Visit: Payer: PPO | Admitting: Speech Pathology

## 2021-11-18 ENCOUNTER — Encounter: Payer: Self-pay | Admitting: Physical Therapy

## 2021-11-18 ENCOUNTER — Ambulatory Visit: Payer: PPO | Admitting: Physical Therapy

## 2021-11-18 DIAGNOSIS — R2681 Unsteadiness on feet: Secondary | ICD-10-CM

## 2021-11-18 DIAGNOSIS — R471 Dysarthria and anarthria: Secondary | ICD-10-CM

## 2021-11-18 DIAGNOSIS — R2689 Other abnormalities of gait and mobility: Secondary | ICD-10-CM

## 2021-11-18 DIAGNOSIS — R293 Abnormal posture: Secondary | ICD-10-CM

## 2021-11-18 NOTE — Therapy (Signed)
OUTPATIENT SPEECH LANGUAGE PATHOLOGY TREATMENT NOTE   Patient Name: Stanley Wright MRN: [id number] DOB:[birth year], 76 y.o., male Today's Date: 11/18/2021  PCP: Scifres, Dorthy, PA-C REFERRING PROVIDER: Ludwig Clarks, DO   END OF SESSION:   End of Session - 11/18/21 0842     Visit Number 10    Number of Visits 17    Date for SLP Re-Evaluation 12/05/21    Authorization Type [employer]    SLP Start Time 0845    SLP Stop Time  0930    SLP Time Calculation (min) 45 min    Activity Tolerance Patient tolerated treatment well              Past Medical History:  Diagnosis Date   Arthritis    BENIGN PROSTATIC HYPERTROPHY, WITH OBSTRUCTION    Cancer ([place])    skin, basal, squamous   COLONIC POLYPS    Diverticulitis    DVT (deep venous thrombosis) ([place]) 2000   Dysrhythmia    Hx Afib- 2017   Early cataract    Factor 5 Leiden mutation, heterozygous ([place])    GERD (gastroesophageal reflux disease)    not on medication   Hearing aid worn    B/L   HIATAL HERNIA    ITP (idiopathic thrombocytopenic purpura) 2003   Long term current use of anticoagulant    Osteoarthritis    right foot   Parkinson's disease ([place]) 02/03/2018   PSORIASIS    Pulmonary embolus ([place]) 2000   Shortness of breath dyspnea    at times - Talking alot   Sleep apnea    Wears glasses    Past Surgical History:  Procedure Laterality Date   ANKLE FUSION Right 08/24/2019   Procedure: RIGHT TALO-NAVICULAR FUSION;  Surgeon: Newt Minion, MD;  Location: [facility];  Service: Orthopedics;  Laterality: Right;   CARDIOVERSION N/A 11/22/2015   Procedure: CARDIOVERSION;  Surgeon: Sanda Klein, MD;  Location: [facility];  Service: Cardiovascular;  Laterality: N/A;   COLONOSCOPY     HERNIA REPAIR Left    Inguinal- x2 . mesh x1   INSERTION OF MESH N/A 02/25/2016   Procedure: INSERTION OF MESH;  Surgeon: Rolm Bookbinder, MD;  Location: [facility];  Service: General;  Laterality: N/A;   LUNG REMOVAL, PARTIAL  Right 2004   was fungal and not cancerous   PROSTATE SURGERY     UMBILICAL HERNIA REPAIR N/A 02/25/2016   Procedure: LAPAROSCOPIC UMBILICAL HERNIA REPAIR;  Surgeon: Rolm Bookbinder, MD;  Location: [facility];  Service: General;  Laterality: N/A;   Patient Active Problem List   Diagnosis Date Noted   Primary osteoarthritis, right ankle and foot    Lower extremity edema 08/10/2019   Acute DVT (deep venous thrombosis) ([place]) 07/19/2019   History of pulmonary embolism 07/19/2019   Parkinson's disease ([place]) 02/03/2018   Community acquired pneumonia of left lower lobe of lung 12/23/2017   Sepsis ([place]) 12/23/2017   Morbid obesity due to excess calories ([place]) 08/23/2016   OSA on CPAP 08/23/2016   Dyspnea on exertion 82/70/7867   Umbilical hernia 54/49/2010   Persistent atrial fibrillation ([place])    Encounter for therapeutic drug monitoring 08/12/2013   Long term current use of anticoagulant 07/30/2010   COLONIC POLYPS 06/03/2010   Factor V Leiden ([place]) 06/03/2010   GERD 06/03/2010   HIATAL HERNIA 06/03/2010   BENIGN PROSTATIC HYPERTROPHY, WITH OBSTRUCTION 06/03/2010   PSORIASIS 06/03/2010    ONSET DATE: referred 10/01/2021, dx with Parkinsonism Sept 2019   REFERRING  DIAG: G20 (ICD-10-CM) - Primary parkinsonism ([place])   THERAPY DIAG: Dysarthria and anarthria  SUBJECTIVE: "same old... vanilla"   PAIN:  Are you having pain? No Description: n/a  OBJECTIVE:   TODAY'S TREATMENT:  11-18-21: Pt with low volume upon entering ST room, despite cues for speaking out and re-casting utterances. Targeted improving vocal intensity through Speak Out lesson 15. Rare min-A required for warm up exercises, usual mod-A to meet volume targets for reading and cognitive exercises. Usual max-A, to include direct modeling, for pacing and breath support during  reading ex with longer sentences.  Loud ah: 87 dB (~10 seconds) Counting: 80 dB Reading (sentences): 74 dB Cognitive Ex: 67 dB Conversational sample: 65  dB   11-15-21: Pt entered with sub-optimal volume averaging low 60s dB. Cued "intentional" voice and "speak out" did not improve vocal intensity. SLP modeling was largely ineffective. Targeted increasing vocal intensity through [employer], lesson 12. Without model, able to meet dB targets, with clear vocal quality, for warm up exercises. Occasional min to mod A for maintaining vocal intensity, pacing, and avoiding speaking on residual air during reading and cognitive exercises. Minimal carryover to conversational sample following structured practice despite usual SLP cues, prompting, and modeling.  Loud ah: 89 dB (6-10 seconds) Counting: 78 dB Reading (sentences): 74 dB Cognitive Ex: 72 dB Conversational sample: 69 dB  PATIENT EDUCATION: Education details: see above Person educated: Patient Education method: Explanation, Demonstration, and Handouts Education comprehension: verbalized understanding, returned demonstration, and needs further education     HOME EXERCISE PROGRAM: Speak Out! Lesson 12, 13, 14   GOALS: Goals reviewed with patient? Yes   SHORT TERM GOALS: Target date: 11/07/2021   Pt will complete dysarthria HEP with mod-I over 2 sessions Baseline: 10-31-2021, 11-05-21 Goal status: met   2.  Pt will average 85 dB in x5 loud "ah" production with rare min-A over 2 sessions Baseline: 10-31-2021, 11-05-21 Goal status: met   3.  Pt will exceed 72 dB in 80% of oral reading trials with usual mod-A over 2 sessions Baseline: 11-05-21, 11-07-21 Goal status: met   4.  Pt will meet dB targets for Speak Out! cognitive exercise (72+ dB) in 80% of trials with usual mod-A over 2 sessions  Baseline: 11-05-21, 11-05-21 Goal status: partially met     LONG TERM GOALS: Target date: 12/05/2021   Pt will report improved voice related QOL via V-QROL by 10 points (calculated score) by d/c.  Baseline: 67.5 calculated score Goal status: ongoing   2.  Pt will average 70+ dB in 10 minute  conversational sample with rare min-A over 2 sessions.  Baseline:  Goal status: ongoing   3.  Pt will report completion of dysarthria HEP, with perceived benefit, at least 1x per day (recommend BID) over 1 week period.  Baseline:  Goal status: ongoing   4.  Pt will average 72+ dB in oral reading trials, paragraph level with rare min-A over 2 sessions Baseline:  Goal status: ongoing   5.  Pt will meet dB targets for Speak Out! cognitive exercise (72+ dB) in 80% of trials with rare min-A over 2 sessions  Baseline:  Goal status: ongoing   ASSESSMENT:   CLINICAL IMPRESSION: Patient is a 76 y.o. 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 Ongoing education and structured practice using intent to elevate pt's communicative effectiveness. ST conducted ongoing training with Speak Out! Program with pt currently requiring occasional to usual min-A for meeting dB targets, usual mod-A  for pacing to prevent rushes of speech, and increased cueing and support for carryover to conversational speech. Increasing awareness of decreased loudness throughout sessions. Recommend continued skilled ST.   OBJECTIVE IMPAIRMENTS  Objective impairments include dysarthria. These impairments are limiting patient from effectively communicating at home and in community.Factors affecting potential to achieve goals and functional outcome are medical prognosis.. Patient will benefit from skilled SLP services to address above impairments and improve overall function.   REHAB POTENTIAL: Good   PLAN: SLP FREQUENCY: 2x/week   SLP DURATION: 8 weeks   PLANNED INTERVENTIONS: Cueing hierachy, Internal/external aids, Functional tasks, SLP instruction and feedback, Compensatory strategies, and Patient/family education  Su Monks, CCC-SLP 11/18/2021, 8:43 AM

## 2021-11-18 NOTE — Therapy (Signed)
OUTPATIENT PHYSICAL THERAPY TREATMENT NOTE   Patient Name: Stanley Wright MRN: [id number] DOB:04-04-1946, 76 y.o., male Today's Date: 11/18/2021  PCP: Maude Leriche, PA-C  REFERRING PROVIDER: Ludwig Clarks, DO   END OF SESSION:   PT End of Session - 11/18/21 0804     Visit Number 9    Number of Visits 13    Date for PT Re-Evaluation 01/08/22   due to delay in scheduling   Authorization Type [employer]    PT Start Time 0802    PT Stop Time 0842    PT Time Calculation (min) 40 min    Equipment Utilized During Treatment Gait belt    Activity Tolerance Patient tolerated treatment well    Behavior During Therapy WFL for tasks assessed/performed                Past Medical History:  Diagnosis Date   Arthritis    BENIGN PROSTATIC HYPERTROPHY, WITH OBSTRUCTION    Cancer ([place])    skin, basal, squamous   COLONIC POLYPS    Diverticulitis    DVT (deep venous thrombosis) ([place]) 2000   Dysrhythmia    Hx Afib- 2017   Early cataract    Factor 5 Leiden mutation, heterozygous ([place])    GERD (gastroesophageal reflux disease)    not on medication   Hearing aid worn    B/L   HIATAL HERNIA    ITP (idiopathic thrombocytopenic purpura) 2003   Long term current use of anticoagulant    Osteoarthritis    right foot   Parkinson's disease ([place]) 02/03/2018   PSORIASIS    Pulmonary embolus ([place]) 2000   Shortness of breath dyspnea    at times - Talking alot   Sleep apnea    Wears glasses    Past Surgical History:  Procedure Laterality Date   ANKLE FUSION Right 08/24/2019   Procedure: RIGHT TALO-NAVICULAR FUSION;  Surgeon: Newt Minion, MD;  Location: [facility];  Service: Orthopedics;  Laterality: Right;   CARDIOVERSION N/A 11/22/2015   Procedure: CARDIOVERSION;  Surgeon: Sanda Klein, MD;  Location: [facility];  Service: Cardiovascular;  Laterality: N/A;   COLONOSCOPY     HERNIA REPAIR Left    Inguinal- x2 . mesh x1   INSERTION OF MESH N/A 02/25/2016   Procedure:  INSERTION OF MESH;  Surgeon: Rolm Bookbinder, MD;  Location: [facility];  Service: General;  Laterality: N/A;   LUNG REMOVAL, PARTIAL Right 2004   was fungal and not cancerous   PROSTATE SURGERY     UMBILICAL HERNIA REPAIR N/A 02/25/2016   Procedure: LAPAROSCOPIC UMBILICAL HERNIA REPAIR;  Surgeon: Rolm Bookbinder, MD;  Location: [facility];  Service: General;  Laterality: N/A;   Patient Active Problem List   Diagnosis Date Noted   Primary osteoarthritis, right ankle and foot    Lower extremity edema 08/10/2019   Acute DVT (deep venous thrombosis) ([place]) 07/19/2019   History of pulmonary embolism 07/19/2019   Parkinson's disease ([place]) 02/03/2018   Community acquired pneumonia of left lower lobe of lung 12/23/2017   Sepsis ([place]) 12/23/2017   Morbid obesity due to excess calories ([place]) 08/23/2016   OSA on CPAP 08/23/2016   Dyspnea on exertion 90/24/0973   Umbilical hernia 53/29/9242   Persistent atrial fibrillation ([place])    Encounter for therapeutic drug monitoring 08/12/2013   Long term current use of anticoagulant 07/30/2010   COLONIC POLYPS 06/03/2010   Factor V Leiden ([place]) 06/03/2010   GERD 06/03/2010   HIATAL HERNIA 06/03/2010  BENIGN PROSTATIC HYPERTROPHY, WITH OBSTRUCTION 06/03/2010   PSORIASIS 06/03/2010    REFERRING DIAG: G20 (ICD-10-CM) - Primary parkinsonism ([place])    THERAPY DIAG:  Unsteadiness on feet  Other abnormalities of gait and mobility  Abnormal posture  PERTINENT HISTORY:  Parkinsonism, 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    PRECAUTIONS: Fall  SUBJECTIVE: No new falls since he was last here. Reports freezing episodes are getting better at home.    PAIN:  Are you having pain? No     TODAY'S TREATMENT:     NMR   Pt performs PWR! Moves  in seated position    PWR! Up for improved posture x10 reps, cues for incr forward lean and looking up for improved cervical extension    PWR! Rock for improved weightshifting x10 reps with reaching across body and progressing to trying to gently kick a leg out.    PWR! Twist for improved trunk rotation x10 reps, cued to stop in the middle with tall posture before twisting to opposite side, cued to use legs to help with pivoting to twist    PWR! Step for improved step initiation x10 reps each side-  single step in and out, pt reporting its getting easier getting in and out of the car.   Pt has been performing consistently at home.      Sit > stand > PWR Up posture in standing with 5 second hold > sit x10 reps, pt needing BUE support to perform sit <> stand. Cued for incr forward lean when standing.    In // bars for improved weight shifting, stepping strategy, and SLS  On rockerboard in M/L direction: weight shifting x8 reps each side with 5 second hold with no UE support, then weight shift and reaching outside of BOS and grabbing cone from therapist and handing it back x8 reps each side, had pt reach laterally and superior/laterally for posture, cued to look at hand when grabbing cone. Pt intermittently needing bars for balance.  Standing on air ex, performing trunk rotation across body outside of BOS and grabbing bean bag from therapist and then stepping off air ex and tossing into crate, x15 reps each side, cued for opening hands when grabbing bean bag and tossing into crate, pt with incr difficulty at times stepping LLE back onto foam and needing intermittent UE support  Forward > retro stepping x12 reps each side with BUE support, cues for incr weight shifting in the posterior direction, attempted without UE support but pt performed too small of a step in the backwards direction, so went back to using [place] turns to 2 targets (like faces on clock, going to 9:00 <> 12:00 and  then 3:00 <> 12:00, performed x12 reps each side, cues for incr amplitude of movement when initiating step and for incr foot clearance. Pt more challenged with taking a step when leading with RLE.   GAIT: Gait pattern: step through pattern, decreased arm swing- Right, decreased arm swing- Left, decreased step length- Left, decreased stance time- Right, decreased stride length, decreased ankle dorsiflexion- Right, and shuffling Distance walked: Various clinic distances  Level of assistance: supervision  Comments: Intermittent cues for heel strike, but pt doing better with stride length.   PATIENT EDUCATION: Education details: Continue with HEP, using AD  at all times to reduce risk of falls   Person educated: Patient Education method: Explanation Education comprehension: verbalized understanding      HOME EXERCISE PROGRAM:  Seated PWR moves   Access Code: [email or web address] URL: [email or web address] Date: 10/18/2021 Prepared by: Mickie Bail Plaster  Exercises - Sit to Stand Without Arm Support  - 1 x daily - 7 x weekly - 3 sets - 10 reps - Proper Sit to Stand Technique  - 1 x daily - 7 x weekly - 3 sets - 10 reps - Seated Shoulder Diagonal Pulls with Resistance  - 1 x daily - 7 x weekly - 3 sets - 10 reps - Lateral Weight Shift with Arm Raise  - 1 x daily - 7 x weekly - 3 sets - 10 reps - Standing Quarter Turn with Counter Support  - 1 x daily - 7 x weekly - 3 sets - 10 reps       GOALS: Goals reviewed with patient? Yes   SHORT TERM GOALS: Target date: 11/07/2021   Pt will be independent with initial HEP for improved flexibility, strength, balance, transfers, and gait.     Baseline: Goal status: MET    2.  Pt will verbalize/demo understanding of tips to reduce festination with turns and gait.  Baseline:  Goal status: MET    3.  Pt will perform 10 reps of sit <>stands with supervision and BUE support without episodes of BLE  bracing in order to demo improved safety w/  transfers Baseline:  Goal status: MET   4.  Pt will undergo further assessment of miniBEST w/ LTG written. Baseline: 17/28 Goal status: MET   5.  Pt will decr manual TUG to 15 seconds or less for decr fall risk.  Baseline: 17.19 seconds, festination w/ initiation of gait; 11.56s, minor freezing w/turn  Goal status: MET     LONG TERM GOALS: Target date: 12/05/2021   Pt will be independent with final HEP for improved flexibility, strength, balance, transfers, and gait.   Baseline:  Goal status: INITIAL   2.  Pt will improve MiniBest to 22/28 for decreased fall risk and improvement with compensatory stepping strategies.   Baseline: 17/28 Goal status: INITIAL   3.  Pt will decr cog TUG to 16 seconds or less for decr fall risk.  Baseline: 18.09 seconds, freezing with turns  Goal status: INITIAL   4.  Pt will improve gait speed with U-Step Walker to at least 2.7 ft/sec in order to demo improved community mobility.    Baseline: 2.38 ft/sec with U-step Walker Goal status: INITIAL     ASSESSMENT:   CLINICAL IMPRESSION: Started session with seated PWR moves with pt performing at home and pt demonstrating improvement of amplitude of movement patterns since last performed, esp with seated PWR Step. Remainder of session focused on standing balance strategies with focus on stepping and SLS tasks. Pt more challenged when taking a larger step with RLE to initiate a turn. No freezing or festination episodes during session. Will continue to progress towards LTGs.  OBJECTIVE IMPAIRMENTS Abnormal gait, decreased activity tolerance, decreased balance, decreased coordination, decreased knowledge of use of DME, difficulty walking, decreased strength, impaired flexibility, postural dysfunction, and pain.    ACTIVITY LIMITATIONS community activity.    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      REHAB POTENTIAL: Good   CLINICAL DECISION MAKING: Evolving/moderate complexity   EVALUATION COMPLEXITY: Moderate   PLAN: PT FREQUENCY: 2x/week   PT DURATION: 12 weeks   PLANNED INTERVENTIONS: Therapeutic exercises, Therapeutic activity, Neuromuscular re-education, Balance training, Gait training, Patient/Family education, and DME instructions   PLAN FOR NEXT SESSION:  will need 10th visit PN. Work on A/P stepping, review festination and freezing strategies, sit<>stand functional strengthening and proper technique, turns. Obstacle navigation, resisted walking, lunges at rebounder, balance on unlevel surfaces, ramps    Oretha Weismann, PT, DPT 11/18/21 8:45 AM   11/18/21 8:45 AM

## 2021-11-20 ENCOUNTER — Ambulatory Visit (INDEPENDENT_AMBULATORY_CARE_PROVIDER_SITE_OTHER): Payer: PPO

## 2021-11-20 DIAGNOSIS — M47812 Spondylosis without myelopathy or radiculopathy, cervical region: Secondary | ICD-10-CM | POA: Diagnosis not present

## 2021-11-20 DIAGNOSIS — I4819 Other persistent atrial fibrillation: Secondary | ICD-10-CM | POA: Diagnosis not present

## 2021-11-20 DIAGNOSIS — Z5181 Encounter for therapeutic drug level monitoring: Secondary | ICD-10-CM | POA: Diagnosis not present

## 2021-11-20 DIAGNOSIS — G44209 Tension-type headache, unspecified, not intractable: Secondary | ICD-10-CM | POA: Diagnosis not present

## 2021-11-20 DIAGNOSIS — M542 Cervicalgia: Secondary | ICD-10-CM | POA: Diagnosis not present

## 2021-11-20 DIAGNOSIS — M9901 Segmental and somatic dysfunction of cervical region: Secondary | ICD-10-CM | POA: Diagnosis not present

## 2021-11-20 LAB — POCT INR: INR: 3.3 — AB (ref 2.0–3.0)

## 2021-11-20 NOTE — Patient Instructions (Addendum)
Description   Today eat a serving of greens and START taking 1 tablet daily except for 1.5 tablets on Mondays.  Recheck INR in 2 weeks.  [facility] [phone]

## 2021-11-21 ENCOUNTER — Ambulatory Visit: Payer: PPO | Admitting: Physical Therapy

## 2021-11-21 ENCOUNTER — Encounter: Payer: Self-pay | Admitting: Physical Therapy

## 2021-11-21 ENCOUNTER — Ambulatory Visit: Payer: PPO | Admitting: Speech Pathology

## 2021-11-21 DIAGNOSIS — Z9181 History of falling: Secondary | ICD-10-CM

## 2021-11-21 DIAGNOSIS — R29818 Other symptoms and signs involving the nervous system: Secondary | ICD-10-CM

## 2021-11-21 DIAGNOSIS — M6281 Muscle weakness (generalized): Secondary | ICD-10-CM

## 2021-11-21 DIAGNOSIS — R2681 Unsteadiness on feet: Secondary | ICD-10-CM | POA: Diagnosis not present

## 2021-11-21 DIAGNOSIS — R293 Abnormal posture: Secondary | ICD-10-CM

## 2021-11-21 DIAGNOSIS — R471 Dysarthria and anarthria: Secondary | ICD-10-CM

## 2021-11-21 NOTE — Therapy (Signed)
OUTPATIENT SPEECH LANGUAGE PATHOLOGY TREATMENT NOTE   Patient Name: Stanley Wright MRN: [id number] DOB:Sep 10, 1945, 76 y.o., male Today's Date: 11/21/2021  PCP: Scifres, Dorthy, PA-C REFERRING PROVIDER: Ludwig Clarks, DO   END OF SESSION:   End of Session - 11/21/21 0849     Visit Number 11    Number of Visits 17    Date for SLP Re-Evaluation 12/05/21    Authorization Type [employer]    SLP Start Time 0849    SLP Stop Time  0928    SLP Time Calculation (min) 39 min    Activity Tolerance Patient tolerated treatment well              Past Medical History:  Diagnosis Date   Arthritis    BENIGN PROSTATIC HYPERTROPHY, WITH OBSTRUCTION    Cancer ([place])    skin, basal, squamous   COLONIC POLYPS    Diverticulitis    DVT (deep venous thrombosis) ([place]) 2000   Dysrhythmia    Hx Afib- 2017   Early cataract    Factor 5 Leiden mutation, heterozygous ([place])    GERD (gastroesophageal reflux disease)    not on medication   Hearing aid worn    B/L   HIATAL HERNIA    ITP (idiopathic thrombocytopenic purpura) 2003   Long term current use of anticoagulant    Osteoarthritis    right foot   Parkinson's disease ([place]) 02/03/2018   PSORIASIS    Pulmonary embolus ([place]) 2000   Shortness of breath dyspnea    at times - Talking alot   Sleep apnea    Wears glasses    Past Surgical History:  Procedure Laterality Date   ANKLE FUSION Right 08/24/2019   Procedure: RIGHT TALO-NAVICULAR FUSION;  Surgeon: Newt Minion, MD;  Location: [facility];  Service: Orthopedics;  Laterality: Right;   CARDIOVERSION N/A 11/22/2015   Procedure: CARDIOVERSION;  Surgeon: Sanda Klein, MD;  Location: [facility];  Service: Cardiovascular;  Laterality: N/A;   COLONOSCOPY     HERNIA REPAIR Left    Inguinal- x2 . mesh x1   INSERTION OF MESH N/A 02/25/2016   Procedure: INSERTION OF MESH;  Surgeon: Rolm Bookbinder, MD;  Location: [facility];  Service: General;  Laterality: N/A;   LUNG REMOVAL, PARTIAL  Right 2004   was fungal and not cancerous   PROSTATE SURGERY     UMBILICAL HERNIA REPAIR N/A 02/25/2016   Procedure: LAPAROSCOPIC UMBILICAL HERNIA REPAIR;  Surgeon: Rolm Bookbinder, MD;  Location: [facility];  Service: General;  Laterality: N/A;   Patient Active Problem List   Diagnosis Date Noted   Primary osteoarthritis, right ankle and foot    Lower extremity edema 08/10/2019   Acute DVT (deep venous thrombosis) ([place]) 07/19/2019   History of pulmonary embolism 07/19/2019   Parkinson's disease ([place]) 02/03/2018   Community acquired pneumonia of left lower lobe of lung 12/23/2017   Sepsis ([place]) 12/23/2017   Morbid obesity due to excess calories ([place]) 08/23/2016   OSA on CPAP 08/23/2016   Dyspnea on exertion 65/46/5035   Umbilical hernia 46/56/8127   Persistent atrial fibrillation ([place])    Encounter for therapeutic drug monitoring 08/12/2013   Long term current use of anticoagulant 07/30/2010   COLONIC POLYPS 06/03/2010   Factor V Leiden ([place]) 06/03/2010   GERD 06/03/2010   HIATAL HERNIA 06/03/2010   BENIGN PROSTATIC HYPERTROPHY, WITH OBSTRUCTION 06/03/2010   PSORIASIS 06/03/2010    ONSET DATE: referred 10/01/2021, dx with Parkinsonism Sept 2019   REFERRING  DIAG: G20 (ICD-10-CM) - Primary parkinsonism ([place])   THERAPY DIAG: Dysarthria and anarthria  SUBJECTIVE: "been talking to the cat"  PAIN:  Are you having pain? No Description: n/a  OBJECTIVE:   TODAY'S TREATMENT:  11-21-21: Pt reports to doing "some" home practice. Tells SLP he practices multiple times a day, but not doing whole lessons d/t some being more work than others. Pt with low volume upon entering, does not raise intensity with max-A and modeling from SLP. SLP leads pt through lesson 18. Allow pt to demonstrate proficiency in exercises prior to SLP model if necessary. Usual min-A to maintain appropriate rate of speech to increase intelligibility. Usual mod-A for increasing intensity in reading and cognitive exercises.  Averages the following dB through structured practice this date: Loud ah: 90 dB (~10 seconds) Counting: 78 dB Reading (sentences): 71 dB Cognitive Ex: 68 dB Limited carryover to conversational speech sample, pt averaging 65 dB despite max-A, visual cues, recasting, and modeling. Education on dysarthria compensations to increase communication success outside of intentional speech, including reducing distance between speaker, limiting background noise, using speaker phone, facing communication partner, letting partner know to let him know if they missed something. Pt verbalizes understanding. Pt endorses benefit of Speak Out program.   11-18-21: Pt with low volume upon entering ST room, despite cues for speaking out and re-casting utterances. Targeted improving vocal intensity through Speak Out lesson 15. Rare min-A required for warm up exercises, usual mod-A to meet volume targets for reading and cognitive exercises. Usual max-A, to include direct modeling, for pacing and breath support during  reading ex with longer sentences.  Loud ah: 87 dB (~10 seconds) Counting: 80 dB Reading (sentences): 74 dB Cognitive Ex: 67 dB Conversational sample: 65 dB   11-15-21: Pt entered with sub-optimal volume averaging low 60s dB. Cued "intentional" voice and "speak out" did not improve vocal intensity. SLP modeling was largely ineffective. Targeted increasing vocal intensity through [employer], lesson 12. Without model, able to meet dB targets, with clear vocal quality, for warm up exercises. Occasional min to mod A for maintaining vocal intensity, pacing, and avoiding speaking on residual air during reading and cognitive exercises. Minimal carryover to conversational sample following structured practice despite usual SLP cues, prompting, and modeling.  Loud ah: 89 dB (6-10 seconds) Counting: 78 dB Reading (sentences): 74 dB Cognitive Ex: 72 dB Conversational sample: 69 dB  PATIENT EDUCATION: Education  details: see above Person educated: Patient Education method: Explanation, Demonstration, and Handouts Education comprehension: verbalized understanding, returned demonstration, and needs further education     HOME EXERCISE PROGRAM: Speak Out! Lessons 18, 19, 20   GOALS: Goals reviewed with patient? Yes   SHORT TERM GOALS: Target date: 11/07/2021   Pt will complete dysarthria HEP with mod-I over 2 sessions Baseline: 10-31-2021, 11-05-21 Goal status: met   2.  Pt will average 85 dB in x5 loud "ah" production with rare min-A over 2 sessions Baseline: 10-31-2021, 11-05-21 Goal status: met   3.  Pt will exceed 72 dB in 80% of oral reading trials with usual mod-A over 2 sessions Baseline: 11-05-21, 11-07-21 Goal status: met   4.  Pt will meet dB targets for Speak Out! cognitive exercise (72+ dB) in 80% of trials with usual mod-A over 2 sessions  Baseline: 11-05-21, 11-05-21 Goal status: partially met     LONG TERM GOALS: Target date: 12/05/2021   Pt will report improved voice related QOL via V-QROL by 10 points (calculated score) by d/c.  Baseline: 67.5 calculated score Goal status: ongoing   2.  Pt will average 70+ dB in 10 minute conversational sample with rare min-A over 2 sessions.  Baseline:  Goal status: ongoing   3.  Pt will report completion of dysarthria HEP, with perceived benefit, at least 1x per day (recommend BID) over 1 week period.  Baseline:  Goal status: ongoing   4.  Pt will average 72+ dB in oral reading trials, paragraph level with rare min-A over 2 sessions Baseline:  Goal status: ongoing   5.  Pt will meet dB targets for Speak Out! cognitive exercise (72+ dB) in 80% of trials with rare min-A over 2 sessions  Baseline:  Goal status: ongoing   ASSESSMENT:   CLINICAL IMPRESSION: Patient is a 76 y.o. 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 Ongoing education and structured practice using intent to elevate pt's communicative effectiveness. ST  conducted ongoing training with Speak Out! Program with pt currently requiring occasional to usual min-A for meeting dB targets, usual mod-A for pacing to prevent rushes of speech, and increased cueing and support for carryover to conversational speech. Increasing awareness of decreased loudness throughout sessions. Recommend continued skilled ST.   OBJECTIVE IMPAIRMENTS  Objective impairments include dysarthria. These impairments are limiting patient from effectively communicating at home and in community.Factors affecting potential to achieve goals and functional outcome are medical prognosis.. Patient will benefit from skilled SLP services to address above impairments and improve overall function.   REHAB POTENTIAL: Good   PLAN: SLP FREQUENCY: 2x/week   SLP DURATION: 8 weeks   PLANNED INTERVENTIONS: Cueing hierachy, Internal/external aids, Functional tasks, SLP instruction and feedback, Compensatory strategies, and Patient/family education  Su Monks, CCC-SLP 11/21/2021, 9:28 AM

## 2021-11-21 NOTE — Therapy (Signed)
OUTPATIENT PHYSICAL THERAPY TREATMENT NOTE/10th VISIT PN   Patient Name: Stanley Wright MRN: [id number] DOB:09/13/1945, 76 y.o., male Today's Date: 11/21/2021  PCP: Maude Leriche, PA-C  REFERRING PROVIDER: Ludwig Clarks, DO   10th Visit Physical Therapy Progress Note  Dates of Reporting Period: 10/10/21 to 11/21/21   END OF SESSION:   PT End of Session - 11/21/21 0931     Visit Number 10    Number of Visits 13    Date for PT Re-Evaluation 01/08/22   due to delay in scheduling   Authorization Type [employer]    PT Start Time 0931    PT Stop Time 1012    PT Time Calculation (min) 41 min    Equipment Utilized During Treatment Gait belt    Activity Tolerance Patient tolerated treatment well    Behavior During Therapy [facility] for tasks assessed/performed;Impulsive                 Past Medical History:  Diagnosis Date   Arthritis    BENIGN PROSTATIC HYPERTROPHY, WITH OBSTRUCTION    Cancer ([place])    skin, basal, squamous   COLONIC POLYPS    Diverticulitis    DVT (deep venous thrombosis) ([place]) 2000   Dysrhythmia    Hx Afib- 2017   Early cataract    Factor 5 Leiden mutation, heterozygous ([place])    GERD (gastroesophageal reflux disease)    not on medication   Hearing aid worn    B/L   HIATAL HERNIA    ITP (idiopathic thrombocytopenic purpura) 2003   Long term current use of anticoagulant    Osteoarthritis    right foot   Parkinson's disease ([place]) 02/03/2018   PSORIASIS    Pulmonary embolus ([place]) 2000   Shortness of breath dyspnea    at times - Talking alot   Sleep apnea    Wears glasses    Past Surgical History:  Procedure Laterality Date   ANKLE FUSION Right 08/24/2019   Procedure: RIGHT TALO-NAVICULAR FUSION;  Surgeon: Newt Minion, MD;  Location: [place];  Service: Orthopedics;  Laterality: Right;   CARDIOVERSION N/A 11/22/2015   Procedure: CARDIOVERSION;  Surgeon: Sanda Klein, MD;  Location: [place];  Service: Cardiovascular;  Laterality:  N/A;   COLONOSCOPY     HERNIA REPAIR Left    Inguinal- x2 . mesh x1   INSERTION OF MESH N/A 02/25/2016   Procedure: INSERTION OF MESH;  Surgeon: Rolm Bookbinder, MD;  Location: [place];  Service: General;  Laterality: N/A;   LUNG REMOVAL, PARTIAL Right 2004   was fungal and not cancerous   PROSTATE SURGERY     UMBILICAL HERNIA REPAIR N/A 02/25/2016   Procedure: LAPAROSCOPIC UMBILICAL HERNIA REPAIR;  Surgeon: Rolm Bookbinder, MD;  Location: [place];  Service: General;  Laterality: N/A;   Patient Active Problem List   Diagnosis Date Noted   Primary osteoarthritis, right ankle and foot    Lower extremity edema 08/10/2019   Acute DVT (deep venous thrombosis) ([place]) 07/19/2019   History of pulmonary embolism 07/19/2019   Parkinson's disease ([place]) 02/03/2018   Community acquired pneumonia of left lower lobe of lung 12/23/2017   Sepsis ([place]) 12/23/2017   Morbid obesity due to excess calories ([place]) 08/23/2016   OSA on CPAP 08/23/2016   Dyspnea on exertion 28/78/6767   Umbilical hernia 20/94/7096   Persistent atrial fibrillation ([place])    Encounter for therapeutic drug monitoring 08/12/2013   Long term current use of anticoagulant 07/30/2010  COLONIC POLYPS 06/03/2010   Factor V Leiden ([place]) 06/03/2010   GERD 06/03/2010   HIATAL HERNIA 06/03/2010   BENIGN PROSTATIC HYPERTROPHY, WITH OBSTRUCTION 06/03/2010   PSORIASIS 06/03/2010    REFERRING DIAG: G20 (ICD-10-CM) - Primary parkinsonism ([place])    THERAPY DIAG:  Muscle weakness (generalized)  Other symptoms and signs involving the nervous system  History of falling  Abnormal posture  PERTINENT HISTORY:  Parkinsonism, 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    PRECAUTIONS: Fall  SUBJECTIVE: Nothing new, no falls or  almost falls.    PAIN:  Are you having pain? No     TODAY'S TREATMENT:     NMR   TUG shuttle: With 2 chairs approx. 15' away, with U- Step rollator, practiced sit <> stand and walking a small distance and turning around. Tried use of marching turns to help with foot clearance and decr freezing/festination episodes. This cue did not work the best for patient. Instead tried counting steps when turning as this helped pt take bigger steps and decr freezing/festination episode. Cued to make sure that pt backs up all the way to the chair before sitting down. Massed practice, approx. 10 reps. When pt did have a freezing episode when practicing, pt needs continued cues to STOP and reset before taking a big step again as pt still tries to fight through the freeze.  Also reminder cues to widen BOS to go from sit > stand to help with balance initially when standing as pt performing with too narrow of a BOS.    At bottom of staircase on level ground, alternating SLS taps to first step x10 reps each side and then x10 reps each side to 2nd step - cued for going slowly  for incr SLS time and tapping each foot on the step for 3 seconds when tapping. Performed without UE support. Placing one leg on first step and holding for 15-20 seconds x3 reps each side. Cued for tall posture and looking ahead when performing.  Retro gait at countertop down and back x4 reps with cues for hingeing from hips, incr step length and slowing down pace. Pt initially taking 20 steps>10 steps with cues for incr step length. When turning to go to the opposite direction, cued to perform a marching turn with incr foot clearance. Pt with difficulty with these turns and still had freezing episodes. Pt needing reminder cues to stop and reset during freezing episodes before continuing with gait.   GAIT: Gait pattern: step through pattern, decreased arm swing- Right, decreased arm swing- Left, decreased step length- Left, decreased stance  time- Right, decreased stride length, decreased ankle dorsiflexion- Right, and shuffling Distance walked: 115' x 2, plus additional distances  Level of assistance: supervision  Comments: Cued for stride length and posture.   Using U-Step Rollator; Practiced U-TURNS with counting steps when turning (as pt tends to shuffle and have freezing/festination) and trying to take as few as possible, initial was 12 steps and then got down to 6. Practiced over 20', approx. 10 reps total. This technique worked the best.   Gait speed: 11.15 seconds = 2.94 ft/sec with U-Step Rollator   PATIENT EDUCATION: Education details: Reviewed strategies with freezing, Counting steps as a strategy to help with freezing/festination episodes during turns during gait with U-Step Rollator  and when turning to sit down on a chair/mat surface.  Person educated: Patient Education method: Explanation, Demonstration Education comprehension: verbalized and demonstration understanding, needs further instructions.      HOME EXERCISE PROGRAM:  Seated PWR moves   Access Code: [email or web address] URL: [email or web address] Date: 10/18/2021 Prepared by: Mickie Bail Plaster  Exercises - Sit to Stand Without Arm Support  - 1 x daily - 7 x weekly - 3 sets - 10 reps - Proper Sit to Stand Technique  - 1 x daily - 7 x weekly - 3 sets - 10 reps - Seated Shoulder Diagonal Pulls with Resistance  - 1 x daily - 7 x weekly - 3 sets - 10 reps - Lateral Weight Shift with Arm Raise  - 1 x daily - 7 x weekly - 3 sets - 10 reps - Standing Quarter Turn with Counter Support  - 1 x daily - 7 x weekly - 3 sets - 10 reps       GOALS: Goals reviewed with patient? Yes   SHORT TERM GOALS: Target date: 11/07/2021   Pt will be independent with initial HEP for improved flexibility, strength, balance, transfers, and gait.     Baseline: Goal status: MET    2.  Pt will verbalize/demo understanding of tips to reduce festination with turns and gait.   Baseline:  Goal status: MET    3.  Pt will perform 10 reps of sit <>stands with supervision and BUE support without episodes of BLE  bracing in order to demo improved safety w/ transfers Baseline:  Goal status: MET   4.  Pt will undergo further assessment of miniBEST w/ LTG written. Baseline: 17/28 Goal status: MET   5.  Pt will decr manual TUG to 15 seconds or less for decr fall risk.  Baseline: 17.19 seconds, festination w/ initiation of gait; 11.56s, minor freezing w/turn  Goal status: MET     LONG TERM GOALS: Target date: 12/05/2021   Pt will be independent with final HEP for improved flexibility, strength, balance, transfers, and gait.   Baseline:  Goal status: INITIAL   2.  Pt will improve MiniBest to 22/28 for decreased fall risk and improvement with compensatory stepping strategies.   Baseline: 17/28 Goal status: INITIAL   3.  Pt will decr cog TUG to 16 seconds or less for decr fall risk.  Baseline: 18.09 seconds, freezing with turns  Goal status: INITIAL   4.  Pt will improve gait speed with U-Step Walker to at least 2.7 ft/sec in order to demo improved community mobility.    Baseline: 2.38 ft/sec with U-step Walker; 2.94 ft/sec on 11/21/21 Goal status: MET      ASSESSMENT:   CLINICAL IMPRESSION: 10th visit PN: Assessed LTG #4 with pt meeting goal in regards to gait speed with U-Step Walker. Pt performed in 2.94 ft/sec today (previously was 2.38 ft/sec), indicating improvements in community mobility. Today's session heavily focused on strategies to use to decr festination/freezing episodes during turns with gait with walker. Pt responded well to counting steps when turning and trying to take as least steps as possible. Pt does continue to need cues that when pt does have freezing/festination episodes to STOP and reset before continuing gait. Will continue to progress towards LTGs.  OBJECTIVE IMPAIRMENTS Abnormal gait, decreased activity tolerance, decreased balance,  decreased coordination, decreased knowledge of use of DME, difficulty walking, decreased strength, impaired flexibility, postural dysfunction, and pain.    ACTIVITY LIMITATIONS community activity.    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      REHAB POTENTIAL: Good   CLINICAL DECISION MAKING: Evolving/moderate complexity   EVALUATION COMPLEXITY: Moderate   PLAN: PT FREQUENCY: 2x/week   PT DURATION: 12 weeks   PLANNED INTERVENTIONS: Therapeutic exercises, Therapeutic activity, Neuromuscular re-education, Balance training, Gait training, Patient/Family education, and DME instructions   PLAN FOR NEXT SESSION:  Continue to work on freezing strategies with turns. Pt has 2 more sessions, D/C vs. Re-cert?  Work on A/P stepping, sit<>stand functional strengthening and proper technique, turns. Obstacle navigation, resisted walking, lunges at rebounder, balance on unlevel surfaces, ramps    Keyden Pavlov, PT, DPT 11/21/21 10:15 AM   11/21/21 10:15 AM

## 2021-11-25 ENCOUNTER — Ambulatory Visit: Payer: PPO | Admitting: Physical Therapy

## 2021-11-25 ENCOUNTER — Encounter: Payer: Self-pay | Admitting: Physical Therapy

## 2021-11-25 ENCOUNTER — Ambulatory Visit: Payer: PPO

## 2021-11-25 DIAGNOSIS — R471 Dysarthria and anarthria: Secondary | ICD-10-CM

## 2021-11-25 DIAGNOSIS — M6281 Muscle weakness (generalized): Secondary | ICD-10-CM

## 2021-11-25 DIAGNOSIS — R29818 Other symptoms and signs involving the nervous system: Secondary | ICD-10-CM

## 2021-11-25 DIAGNOSIS — R293 Abnormal posture: Secondary | ICD-10-CM

## 2021-11-25 DIAGNOSIS — Z9181 History of falling: Secondary | ICD-10-CM

## 2021-11-25 DIAGNOSIS — R2681 Unsteadiness on feet: Secondary | ICD-10-CM | POA: Diagnosis not present

## 2021-11-25 NOTE — Therapy (Signed)
OUTPATIENT SPEECH LANGUAGE PATHOLOGY TREATMENT NOTE   Patient Name: Stanley Wright MRN: [id number] DOB:05/09/1946, 76 y.o., male Today's Date: 11/25/2021  PCP: Scifres, Dorthy, PA-C REFERRING PROVIDER: Tat, Rebecca S, DO   END OF SESSION:   End of Session - 11/25/21 0827     Visit Number 12    Number of Visits 17    Date for SLP Re-Evaluation 12/05/21    Authorization Type [employer]    SLP Start Time 0845    SLP Stop Time  0930    SLP Time Calculation (min) 45 min    Activity Tolerance Patient tolerated treatment well              Past Medical History:  Diagnosis Date   Arthritis    BENIGN PROSTATIC HYPERTROPHY, WITH OBSTRUCTION    Cancer ([facility])    skin, basal, squamous   COLONIC POLYPS    Diverticulitis    DVT (deep venous thrombosis) ([facility]) 2000   Dysrhythmia    Hx Afib- 2017   Early cataract    Factor 5 Leiden mutation, heterozygous ([facility])    GERD (gastroesophageal reflux disease)    not on medication   Hearing aid worn    B/L   HIATAL HERNIA    ITP (idiopathic thrombocytopenic purpura) 2003   Long term current use of anticoagulant    Osteoarthritis    right foot   Parkinson's disease ([facility]) 02/03/2018   PSORIASIS    Pulmonary embolus ([facility]) 2000   Shortness of breath dyspnea    at times - Talking alot   Sleep apnea    Wears glasses    Past Surgical History:  Procedure Laterality Date   ANKLE FUSION Right 08/24/2019   Procedure: RIGHT TALO-NAVICULAR FUSION;  Surgeon: Duda, Marcus V, MD;  Location: [facility];  Service: Orthopedics;  Laterality: Right;   CARDIOVERSION N/A 11/22/2015   Procedure: CARDIOVERSION;  Surgeon: Mihai Croitoru, MD;  Location: [facility];  Service: Cardiovascular;  Laterality: N/A;   COLONOSCOPY     HERNIA REPAIR Left    Inguinal- x2 . mesh x1   INSERTION OF MESH N/A 02/25/2016   Procedure: INSERTION OF MESH;  Surgeon: Matthew Wakefield, MD;  Location: [facility];  Service: General;  Laterality: N/A;   LUNG REMOVAL, PARTIAL  Right 2004   was fungal and not cancerous   PROSTATE SURGERY     UMBILICAL HERNIA REPAIR N/A 02/25/2016   Procedure: LAPAROSCOPIC UMBILICAL HERNIA REPAIR;  Surgeon: Matthew Wakefield, MD;  Location: [facility];  Service: General;  Laterality: N/A;   Patient Active Problem List   Diagnosis Date Noted   Primary osteoarthritis, right ankle and foot    Lower extremity edema 08/10/2019   Acute DVT (deep venous thrombosis) ([facility]) 07/19/2019   History of pulmonary embolism 07/19/2019   Parkinson's disease ([facility]) 02/03/2018   Community acquired pneumonia of left lower lobe of lung 12/23/2017   Sepsis ([facility]) 12/23/2017   Morbid obesity due to excess calories ([facility]) 08/23/2016   OSA on CPAP 08/23/2016   Dyspnea on exertion 08/22/2016   Umbilical hernia 02/25/2016   Persistent atrial fibrillation ([facility])    Encounter for therapeutic drug monitoring 08/12/2013   Long term current use of anticoagulant 07/30/2010   COLONIC POLYPS 06/03/2010   Factor V Leiden ([facility]) 06/03/2010   GERD 06/03/2010   HIATAL HERNIA 06/03/2010   BENIGN PROSTATIC HYPERTROPHY, WITH OBSTRUCTION 06/03/2010   PSORIASIS 06/03/2010    ONSET DATE: referred 10/01/2021, dx with Parkinsonism Sept 2019   REFERRING   DIAG: G20 (ICD-10-CM) - Primary parkinsonism ([place])   THERAPY DIAG: Dysarthria and anarthria  SUBJECTIVE: "weak" when asked to self-analyze initial volume  PAIN:  Are you having pain? No Description: n/a  OBJECTIVE:   TODAY'S TREATMENT:  11-25-21: Pt entered with conversational volume averaging upper 60s dB, in which pt identified reduced volume when prompted. Continues to partially complete Speak Out! Lessons due to reported time constraints. SLP leads pt through [place] 20 as pt did not fully complete this lesson at home. Usual min to mod-A to maintain appropriate rate of speech and optimize breath support to increase intelligibility. Occasional min-A for maintaining and increasing intensity during cognitive exercises.  Averaged the following dB through structured practice this date: Loud ah: 89 dB (~10 seconds) Counting: 81 dB Reading (passage): 73 dB Cognitive Ex: 72 dB (usual cues to slow rate and occasional cues to increase volume)  11-21-21: Pt reports to doing "some" home practice. Tells SLP he practices multiple times a day, but not doing whole lessons d/t some being more work than others. Pt with low volume upon entering, does not raise intensity with max-A and modeling from SLP. SLP leads pt through lesson 18. Allow pt to demonstrate proficiency in exercises prior to SLP model if necessary. Usual min-A to maintain appropriate rate of speech to increase intelligibility. Usual mod-A for increasing intensity in reading and cognitive exercises. Averages the following dB through structured practice this date: Loud ah: 90 dB (~10 seconds) Counting: 78 dB Reading (sentences): 71 dB Cognitive Ex: 68 dB Limited carryover to conversational speech sample, pt averaging 65 dB despite max-A, visual cues, recasting, and modeling. Education on dysarthria compensations to increase communication success outside of intentional speech, including reducing distance between speaker, limiting background noise, using speaker phone, facing communication partner, letting partner know to let him know if they missed something. Pt verbalizes understanding. Pt endorses benefit of Speak Out program.   11-18-21: Pt with low volume upon entering ST room, despite cues for speaking out and re-casting utterances. Targeted improving vocal intensity through Speak Out lesson 15. Rare min-A required for warm up exercises, usual mod-A to meet volume targets for reading and cognitive exercises. Usual max-A, to include direct modeling, for pacing and breath support during  reading ex with longer sentences.  Loud ah: 87 dB (~10 seconds) Counting: 80 dB Reading (sentences): 74 dB Cognitive Ex: 67 dB Conversational sample: 65 dB  11-15-21: Pt entered with  sub-optimal volume averaging low 60s dB. Cued "intentional" voice and "speak out" did not improve vocal intensity. SLP modeling was largely ineffective. Targeted increasing vocal intensity through [employer], lesson 12. Without model, able to meet dB targets, with clear vocal quality, for warm up exercises. Occasional min to mod A for maintaining vocal intensity, pacing, and avoiding speaking on residual air during reading and cognitive exercises. Minimal carryover to conversational sample following structured practice despite usual SLP cues, prompting, and modeling.  Loud ah: 89 dB (6-10 seconds) Counting: 78 dB Reading (sentences): 74 dB Cognitive Ex: 72 dB Conversational sample: 69 dB  PATIENT EDUCATION: Education details: see above Person educated: Patient Education method: Explanation, Demonstration, and Handouts Education comprehension: verbalized understanding, returned demonstration, and needs further education     HOME EXERCISE PROGRAM: Speak Out! Lessons 18, 19, 20   GOALS: Goals reviewed with patient? Yes   SHORT TERM GOALS: Target date: 11/07/2021   Pt will complete dysarthria HEP with mod-I over 2 sessions Baseline: 10-31-2021, 11-05-21 Goal status: met   2.  Pt will average 85 dB in x5 loud "ah" production with rare min-A over 2 sessions Baseline: 10-31-2021, 11-05-21 Goal status: met   3.  Pt will exceed 72 dB in 80% of oral reading trials with usual mod-A over 2 sessions Baseline: 11-05-21, 11-07-21 Goal status: met   4.  Pt will meet dB targets for Speak Out! cognitive exercise (72+ dB) in 80% of trials with usual mod-A over 2 sessions  Baseline: 11-05-21, 11-05-21 Goal status: partially met     LONG TERM GOALS: Target date: 12/05/2021   Pt will report improved voice related QOL via V-QROL by 10 points (calculated score) by d/c.  Baseline: 67.5 calculated score Goal status: ongoing   2.  Pt will average 70+ dB in 10 minute conversational sample with rare  min-A over 2 sessions.  Baseline:  Goal status: ongoing   3.  Pt will report completion of dysarthria HEP, with perceived benefit, at least 1x per day (recommend BID) over 1 week period.  Baseline:  Goal status: ongoing   4.  Pt will average 72+ dB in oral reading trials, paragraph level with rare min-A over 2 sessions Baseline: 11-25-21 Goal status: ongoing   5.  Pt will meet dB targets for Speak Out! cognitive exercise (72+ dB) in 80% of trials with rare min-A over 2 sessions  Baseline:  Goal status: ongoing   ASSESSMENT:   CLINICAL IMPRESSION: Patient is a 76 y.o. 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 Ongoing education and structured practice using intent to elevate pt's communicative effectiveness. ST conducted ongoing training with Speak Out! Program with pt currently requiring occasional min-A for meeting dB targets, usual mod-A for pacing to prevent rushes of speech, and increased cueing and support for carryover to conversational speech. Increasing awareness of decreased loudness throughout sessions. Recommend continued skilled ST.   OBJECTIVE IMPAIRMENTS  Objective impairments include dysarthria. These impairments are limiting patient from effectively communicating at home and in community.Factors affecting potential to achieve goals and functional outcome are medical prognosis. Patient will benefit from skilled SLP services to address above impairments and improve overall function.   REHAB POTENTIAL: Good   PLAN: SLP FREQUENCY: 2x/week   SLP DURATION: 8 weeks   PLANNED INTERVENTIONS: Cueing hierachy, Internal/external aids, Functional tasks, SLP instruction and feedback, Compensatory strategies, and Patient/family education  Katherine I Johnson, CCC-SLP 11/25/2021, 8:27 AM   

## 2021-11-25 NOTE — Therapy (Signed)
OUTPATIENT PHYSICAL THERAPY TREATMENT NOTE   Patient Name: Stanley Wright MRN: [id number] DOB:1945-07-02, 76 y.o., male Today's Date: 11/25/2021  PCP: Maude Leriche, PA-C  REFERRING PROVIDER: Ludwig Clarks, DO   END OF SESSION:   PT End of Session - 11/25/21 0806     Visit Number 11    Number of Visits 13    Date for PT Re-Evaluation 01/08/22   due to delay in scheduling   Authorization Type [employer]    PT Start Time 0805    PT Stop Time 0845    PT Time Calculation (min) 40 min    Equipment Utilized During Treatment Gait belt    Activity Tolerance Patient tolerated treatment well    Behavior During Therapy [facility] for tasks assessed/performed;Impulsive                 Past Medical History:  Diagnosis Date   Arthritis    BENIGN PROSTATIC HYPERTROPHY, WITH OBSTRUCTION    Cancer ([place])    skin, basal, squamous   COLONIC POLYPS    Diverticulitis    DVT (deep venous thrombosis) ([place]) 2000   Dysrhythmia    Hx Afib- 2017   Early cataract    Factor 5 Leiden mutation, heterozygous ([place])    GERD (gastroesophageal reflux disease)    not on medication   Hearing aid worn    B/L   HIATAL HERNIA    ITP (idiopathic thrombocytopenic purpura) 2003   Long term current use of anticoagulant    Osteoarthritis    right foot   Parkinson's disease ([place]) 02/03/2018   PSORIASIS    Pulmonary embolus ([place]) 2000   Shortness of breath dyspnea    at times - Talking alot   Sleep apnea    Wears glasses    Past Surgical History:  Procedure Laterality Date   ANKLE FUSION Right 08/24/2019   Procedure: RIGHT TALO-NAVICULAR FUSION;  Surgeon: Newt Minion, MD;  Location: [place];  Service: Orthopedics;  Laterality: Right;   CARDIOVERSION N/A 11/22/2015   Procedure: CARDIOVERSION;  Surgeon: Sanda Klein, MD;  Location: [place];  Service: Cardiovascular;  Laterality: N/A;   COLONOSCOPY     HERNIA REPAIR Left    Inguinal- x2 . mesh x1   INSERTION OF MESH N/A 02/25/2016    Procedure: INSERTION OF MESH;  Surgeon: Rolm Bookbinder, MD;  Location: [place];  Service: General;  Laterality: N/A;   LUNG REMOVAL, PARTIAL Right 2004   was fungal and not cancerous   PROSTATE SURGERY     UMBILICAL HERNIA REPAIR N/A 02/25/2016   Procedure: LAPAROSCOPIC UMBILICAL HERNIA REPAIR;  Surgeon: Rolm Bookbinder, MD;  Location: [place];  Service: General;  Laterality: N/A;   Patient Active Problem List   Diagnosis Date Noted   Primary osteoarthritis, right ankle and foot    Lower extremity edema 08/10/2019   Acute DVT (deep venous thrombosis) ([place]) 07/19/2019   History of pulmonary embolism 07/19/2019   Parkinson's disease ([place]) 02/03/2018   Community acquired pneumonia of left lower lobe of lung 12/23/2017   Sepsis ([place]) 12/23/2017   Morbid obesity due to excess calories ([place]) 08/23/2016   OSA on CPAP 08/23/2016   Dyspnea on exertion 48/54/6270   Umbilical hernia 35/00/9381   Persistent atrial fibrillation ([place])    Encounter for therapeutic drug monitoring 08/12/2013   Long term current use of anticoagulant 07/30/2010   COLONIC POLYPS 06/03/2010   Factor V Leiden ([place]) 06/03/2010   GERD 06/03/2010   HIATAL HERNIA  06/03/2010   BENIGN PROSTATIC HYPERTROPHY, WITH OBSTRUCTION 06/03/2010   PSORIASIS 06/03/2010    REFERRING DIAG: G20 (ICD-10-CM) - Primary parkinsonism ([place])    THERAPY DIAG:  Muscle weakness (generalized)  Other symptoms and signs involving the nervous system  History of falling  Abnormal posture  PERTINENT HISTORY:  Parkinsonism, 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    PRECAUTIONS: Fall  SUBJECTIVE: No changes, no falls. Has been working on the turning at home and it has been going well.    PAIN:  Are you having pain? No      TODAY'S TREATMENT:   Therapeutic Exercise:  Scifit with BUE/BLE level 4.5 x 8 minutes with goal >/= 70 steps for strengthening, . movement patterns and activity tolerance.     NMR  In // bars: On air ex:  -Lateral stepping strategy x10 reps each side, with pt needing single UE support, cues for slowed paced and foot clearance/weight shifting x10 reps each side -Wide BOS lateral weight shifting with reaching toward cone (PT holding cone) - either laterally or superior/lateral x10 reps each side, at end range holding for 5 seconds when reaching.  On level ground alternating posterior stepping strategy beginning with UE support and then none with reaching forwards (cues to open up hands and perform with elbow extension) x12 reps each side, cues for proper technique and weight shifting posteriorly Forwards/backwards marching with fingertip support down and back x4 reps, cues for slowed pace and incr ROM.  GAIT: Gait pattern: step through pattern, decreased arm swing- Right, decreased arm swing- Left, decreased step length- Left, decreased stance time- Right, decreased stride length, decreased ankle dorsiflexion- Right, and shuffling Distance walked: Distances throughout session.  Level of assistance: supervision  Comments: Cued for stride length and posture.   Using U-Step Rollator; Practiced tighter turns in kitchen with marching turns for incr foot clearance to help with freezing festination episodes, performed x8 reps total with pt improving with incr reps with pt focusing on foot clearance with turning. Discussed using this technique in his kitchen or other areas where he may have to use a tighter turn and counting steps may not work as well. Educated to make sure pt has rollator at all times in the kitchen.  At one point pt tried to go through mat tables to get to pt's mat, pt attempted to pick up his walker and go through (the space was too narrow) Cued to stop and reset and back up and go  a different direction.     PATIENT EDUCATION: Education details: Discussed POC going forwards -pt with one more appt scheduled this week, will plan to check goals next visit and then add 2-3 more weeks. Discussed after POC for either PD screen vs. PD eval in 6 months, decided that screens would be best so pt can also be seen by OT. Discussed importance of making sure that pt uses his U-Step Walker at all times at home in order to decr fall risk as pt does not currently use it at all times (even in the kitchen). Pt reports difficulties with walking and trying to hold objects, discussed placing objects on his U-Step rollator to decr fall risk and freezing/festination episodes.  Person educated: Patient Education method: Explanation and demonstration  Education comprehension: verbalized and demonstration understanding,  needs further instructions.      HOME EXERCISE PROGRAM:  Seated PWR moves   Access Code: [email or web address] URL: [email or web address] Date: 10/18/2021 Prepared by: Mickie Bail Plaster  Exercises - Sit to Stand Without Arm Support  - 1 x daily - 7 x weekly - 3 sets - 10 reps - Proper Sit to Stand Technique  - 1 x daily - 7 x weekly - 3 sets - 10 reps - Seated Shoulder Diagonal Pulls with Resistance  - 1 x daily - 7 x weekly - 3 sets - 10 reps - Lateral Weight Shift with Arm Raise  - 1 x daily - 7 x weekly - 3 sets - 10 reps - Standing Quarter Turn with Counter Support  - 1 x daily - 7 x weekly - 3 sets - 10 reps       GOALS: Goals reviewed with patient? Yes   SHORT TERM GOALS: Target date: 11/07/2021   Pt will be independent with initial HEP for improved flexibility, strength, balance, transfers, and gait.     Baseline: Goal status: MET    2.  Pt will verbalize/demo understanding of tips to reduce festination with turns and gait.  Baseline:  Goal status: MET    3.  Pt will perform 10 reps of sit <>stands with supervision and BUE support without episodes of BLE   bracing in order to demo improved safety w/ transfers Baseline:  Goal status: MET   4.  Pt will undergo further assessment of miniBEST w/ LTG written. Baseline: 17/28 Goal status: MET   5.  Pt will decr manual TUG to 15 seconds or less for decr fall risk.  Baseline: 17.19 seconds, festination w/ initiation of gait; 11.56s, minor freezing w/turn  Goal status: MET     LONG TERM GOALS: Target date: 12/05/2021   Pt will be independent with final HEP for improved flexibility, strength, balance, transfers, and gait.   Baseline:  Goal status: INITIAL   2.  Pt will improve MiniBest to 22/28 for decreased fall risk and improvement with compensatory stepping strategies.   Baseline: 17/28 Goal status: INITIAL   3.  Pt will decr cog TUG to 16 seconds or less for decr fall risk.  Baseline: 18.09 seconds, freezing with turns  Goal status: INITIAL   4.  Pt will improve gait speed with U-Step Walker to at least 2.7 ft/sec in order to demo improved community mobility.    Baseline: 2.38 ft/sec with Henrene Dodge; 2.94 ft/sec on 11/21/21 Goal status: MET      ASSESSMENT:   CLINICAL IMPRESSION: Continued to work on techniques to help with turning to decr freezing/festination with gait with U-Step [place] Rile. In the kitchen worked on tighter turns such as marching with pt needing cues for incr foot clearance. Pt did do well when focusing on this and had no freezing/festination episodes. Worked on balance strategies with weight shifting, stepping, and SLS. Pt does need cues at times to slow down pace. Will continue to progress towards LTGs.  OBJECTIVE IMPAIRMENTS Abnormal gait, decreased activity tolerance, decreased balance, decreased coordination, decreased knowledge of use of DME, difficulty walking, decreased strength, impaired flexibility, postural dysfunction, and pain.    ACTIVITY LIMITATIONS community activity.    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  REHAB POTENTIAL: Good   CLINICAL DECISION MAKING: Evolving/moderate complexity   EVALUATION COMPLEXITY: Moderate   PLAN: PT FREQUENCY: 2x/week   PT DURATION: 12 weeks   PLANNED INTERVENTIONS: Therapeutic exercises, Therapeutic activity, Neuromuscular re-education, Balance training, Gait training, Patient/Family education, and DME instructions   PLAN FOR NEXT SESSION:  Check LTGs and update/mark them as ongoing as pt has 12 week POC. Schedule out 2x week for 2-3 more weeks and plan to D/C then with PD screens in 6 months.   Continue to work on freezing strategies with turns.  Work on A/P stepping, sit<>stand functional strengthening and proper technique, turns. Obstacle navigation, resisted walking, lunges at rebounder, balance on unlevel surfaces, ramps    Holden Draughon, PT, DPT 11/25/21 10:59 AM   11/25/21 10:59 AM

## 2021-11-27 DIAGNOSIS — M47812 Spondylosis without myelopathy or radiculopathy, cervical region: Secondary | ICD-10-CM | POA: Diagnosis not present

## 2021-11-27 DIAGNOSIS — G44209 Tension-type headache, unspecified, not intractable: Secondary | ICD-10-CM | POA: Diagnosis not present

## 2021-11-27 DIAGNOSIS — M542 Cervicalgia: Secondary | ICD-10-CM | POA: Diagnosis not present

## 2021-11-27 DIAGNOSIS — M9901 Segmental and somatic dysfunction of cervical region: Secondary | ICD-10-CM | POA: Diagnosis not present

## 2021-11-28 ENCOUNTER — Ambulatory Visit: Payer: PPO | Admitting: Physical Therapy

## 2021-11-28 ENCOUNTER — Ambulatory Visit: Payer: PPO | Admitting: Speech Pathology

## 2021-11-28 DIAGNOSIS — R293 Abnormal posture: Secondary | ICD-10-CM

## 2021-11-28 DIAGNOSIS — M6281 Muscle weakness (generalized): Secondary | ICD-10-CM

## 2021-11-28 DIAGNOSIS — R2689 Other abnormalities of gait and mobility: Secondary | ICD-10-CM

## 2021-11-28 DIAGNOSIS — R471 Dysarthria and anarthria: Secondary | ICD-10-CM

## 2021-11-28 DIAGNOSIS — R2681 Unsteadiness on feet: Secondary | ICD-10-CM

## 2021-11-28 NOTE — Therapy (Signed)
OUTPATIENT PHYSICAL THERAPY TREATMENT NOTE   Patient Name: Stanley Wright MRN: [id number] DOB:03-05-1946, 76 y.o., male Today's Date: 11/28/2021  PCP: Maude Leriche, PA-C  REFERRING PROVIDER: Ludwig Clarks, DO   END OF SESSION:   PT End of Session - 11/28/21 0803     Visit Number 12    Number of Visits 19    Date for PT Re-Evaluation 01/08/22   due to delay in scheduling   Authorization Type [employer]    PT Start Time 0802    PT Stop Time 0848    PT Time Calculation (min) 46 min    Equipment Utilized During Treatment --    Activity Tolerance Patient tolerated treatment well    Behavior During Therapy WFL for tasks assessed/performed                  Past Medical History:  Diagnosis Date   Arthritis    BENIGN PROSTATIC HYPERTROPHY, WITH OBSTRUCTION    Cancer ([place])    skin, basal, squamous   COLONIC POLYPS    Diverticulitis    DVT (deep venous thrombosis) ([place]) 2000   Dysrhythmia    Hx Afib- 2017   Early cataract    Factor 5 Leiden mutation, heterozygous ([place])    GERD (gastroesophageal reflux disease)    not on medication   Hearing aid worn    B/L   HIATAL HERNIA    ITP (idiopathic thrombocytopenic purpura) 2003   Long term current use of anticoagulant    Osteoarthritis    right foot   Parkinson's disease ([place]) 02/03/2018   PSORIASIS    Pulmonary embolus ([place]) 2000   Shortness of breath dyspnea    at times - Talking alot   Sleep apnea    Wears glasses    Past Surgical History:  Procedure Laterality Date   ANKLE FUSION Right 08/24/2019   Procedure: RIGHT TALO-NAVICULAR FUSION;  Surgeon: Newt Minion, MD;  Location: [place];  Service: Orthopedics;  Laterality: Right;   CARDIOVERSION N/A 11/22/2015   Procedure: CARDIOVERSION;  Surgeon: Sanda Klein, MD;  Location: [place];  Service: Cardiovascular;  Laterality: N/A;   COLONOSCOPY     HERNIA REPAIR Left    Inguinal- x2 . mesh x1   INSERTION OF MESH N/A 02/25/2016   Procedure:  INSERTION OF MESH;  Surgeon: Rolm Bookbinder, MD;  Location: [place];  Service: General;  Laterality: N/A;   LUNG REMOVAL, PARTIAL Right 2004   was fungal and not cancerous   PROSTATE SURGERY     UMBILICAL HERNIA REPAIR N/A 02/25/2016   Procedure: LAPAROSCOPIC UMBILICAL HERNIA REPAIR;  Surgeon: Rolm Bookbinder, MD;  Location: [place];  Service: General;  Laterality: N/A;   Patient Active Problem List   Diagnosis Date Noted   Primary osteoarthritis, right ankle and foot    Lower extremity edema 08/10/2019   Acute DVT (deep venous thrombosis) ([place]) 07/19/2019   History of pulmonary embolism 07/19/2019   Parkinson's disease ([place]) 02/03/2018   Community acquired pneumonia of left lower lobe of lung 12/23/2017   Sepsis ([place]) 12/23/2017   Morbid obesity due to excess calories ([place]) 08/23/2016   OSA on CPAP 08/23/2016   Dyspnea on exertion 86/76/7209   Umbilical hernia 47/02/6282   Persistent atrial fibrillation ([place])    Encounter for therapeutic drug monitoring 08/12/2013   Long term current use of anticoagulant 07/30/2010   COLONIC POLYPS 06/03/2010   Factor V Leiden ([place]) 06/03/2010   GERD 06/03/2010   HIATAL HERNIA  06/03/2010   BENIGN PROSTATIC HYPERTROPHY, WITH OBSTRUCTION 06/03/2010   PSORIASIS 06/03/2010    REFERRING DIAG: G20 (ICD-10-CM) - Primary parkinsonism ([place])    THERAPY DIAG:  Muscle weakness (generalized)  Abnormal posture  Unsteadiness on feet  Other abnormalities of gait and mobility  PERTINENT HISTORY:  Parkinsonism, 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    PRECAUTIONS: Fall  SUBJECTIVE: No changes, no falls. Has not been performing exercises, mainly focused on walking (300-400 steps to the mailbox and back). Pt verbalized frustration regarding  not being sure "we are on the same page with therapy", as his goal is to avoid use of the walker at all times,, whereas therapy and Dr. Carles Collet are telling him he needs to use it 24/7. "I do not want to use it in my house". Pt also reports he wants to start [employer].    PAIN:  Are you having pain? No     TODAY'S TREATMENT:   Therapeutic Exercise:  Scifit multi-peaks level 8 for 8 minutes using BUE/BLEs for neural priming for reciprocal movement, interval training and global strength. Total of 670 steps. RPE of 4/10 following activity.     [facility] PT Assessment - 11/28/21 0820       Mini-BESTest   Sit To Stand Normal: Comes to stand without use of hands and stabilizes independently.    Rise to Toes Moderate: Heels up, but not full range (smaller than when holding hands), OR noticeable instability for 3 s.    Stand on one leg (left) Moderate: < 20 s   5s   Stand on one leg (right) Moderate: < 20 s   ~1s   Stand on one leg - lowest score 1    Compensatory Stepping Correction - Forward Normal: Recovers independently with a single, large step (second realignement is allowed).    Compensatory Stepping Correction - Backward No step, OR would fall if not caught, OR falls spontaneously.    Compensatory Stepping Correction - Left Lateral Moderate: Several steps to recover equilibrium   Crossover step and 2 small steps   Compensatory Stepping Correction - Right Lateral Moderate: Several steps to recover equilibrium   Crossover step and 2 small steps   Stepping Corredtion Lateral - lowest score 1    Stance - Feet together, eyes open, firm surface  Normal: 30s    Stance - Feet together, eyes closed, foam surface  Normal: 30s    Incline - Eyes Closed Normal: Stands independently 30s and aligns with gravity    Change in Gait Speed Normal: Significantly changes walkling speed without imbalance    Walk with head turns - Horizontal Normal: performs head turns with no change in gait speed and good  balance    Walk with pivot turns Moderate:Turns with feet close SLOW (>4 steps) with good balance.    Step over obstacles Normal: Able to step over box with minimal change of gait speed and with good balance.    Timed UP & GO with Dual Task Moderate: Dual Task affects either counting OR walking (>10%) when compared to the TUG without Dual Task.    Mini-BEST total score 21      Timed Up and Go Test   Normal TUG (seconds) 10.81   Significant freezing w/turn   Cognitive TUG (  seconds) 19.16   Significant freezing w/turn, did not stop counting             GAIT: Gait pattern: step through pattern, decreased arm swing- Right, decreased arm swing- Left, decreased step length- Left, decreased stance time- Right, decreased stride length, decreased ankle dorsiflexion- Right, and shuffling Distance walked: Distances throughout session.  Level of assistance: supervision  Comments: Cued for stride length and posture.    PATIENT EDUCATION: Education details: Goal assessment. Discussed safety recommendations with use of U-step at home. Pt verbalized frustration regarding being told he needs to use walker at all times, as his goal for therapy is to get away from using it and he does not feel as though therapy is on the same page. Informed pt on rationale behind our recommendation, as reducing frequency of falls is important to reduce risk of injury that could cause setback w/progress in therapy. Pt verbalized understanding but stated he would not use the walker inside the home at all times because he does not need it and all falls have happened when he was "pushing the envelope" and he knows not to do that anymore. Strongly encouraged pt to use walker outside of home and in community at all times, as he is safer and can better manage his freezing with a walker. Encouraged pt to use walker inside home but also am realistic that pt will not, so advised pt to use it on days when his freezing is worse or in  areas where his freezing is triggered. Pt in agreement with compromise. Pt inquiring about rock steady boxing, will discuss next session.  Person educated: Patient Education method: Explanation and demonstration  Education comprehension: verbalized and demonstration understanding, needs further instructions.      HOME EXERCISE PROGRAM:  Seated PWR moves   Access Code: [email or web address] URL: [email or web address] Date: 10/18/2021 Prepared by: Mickie Bail Plaster  Exercises - Sit to Stand Without Arm Support  - 1 x daily - 7 x weekly - 3 sets - 10 reps - Proper Sit to Stand Technique  - 1 x daily - 7 x weekly - 3 sets - 10 reps - Seated Shoulder Diagonal Pulls with Resistance  - 1 x daily - 7 x weekly - 3 sets - 10 reps - Lateral Weight Shift with Arm Raise  - 1 x daily - 7 x weekly - 3 sets - 10 reps - Standing Quarter Turn with Counter Support  - 1 x daily - 7 x weekly - 3 sets - 10 reps       GOALS: Goals reviewed with patient? Yes   SHORT TERM GOALS: Target date: 11/07/2021   Pt will be independent with initial HEP for improved flexibility, strength, balance, transfers, and gait.     Baseline: Goal status: MET    2.  Pt will verbalize/demo understanding of tips to reduce festination with turns and gait.  Baseline:  Goal status: MET    3.  Pt will perform 10 reps of sit <>stands with supervision and BUE support without episodes of BLE  bracing in order to demo improved safety w/ transfers Baseline:  Goal status: MET   4.  Pt will undergo further assessment of miniBEST w/ LTG written. Baseline: 17/28 Goal status: MET   5.  Pt will decr manual TUG to 15 seconds or less for decr fall risk.  Baseline: 17.19 seconds, festination w/ initiation of gait; 11.56s, minor freezing w/turn  Goal status: MET     LONG TERM GOALS: Target  date: 12/05/2021   Pt will be independent with final HEP for improved flexibility, strength, balance, transfers, and gait.   Baseline:  Goal  status: PROGRESSING    2.  Pt will improve MiniBest to 22/28 for decreased fall risk and improvement with compensatory stepping strategies.   Baseline: 17/28; 21/28 (6/15) Goal status: PROGRESSING   3.  Pt will decr cog TUG to 16 seconds or less for decr fall risk.  Baseline: 18.09 seconds, freezing with turns; 19.16s w/significant freezing on 6/15 Goal status: PROGRESSING    4.  Pt will improve gait speed with U-Step Walker to at least 2.7 ft/sec in order to demo improved community mobility.    Baseline: 2.38 ft/sec with U-step Walker; 2.94 ft/sec on 11/21/21 Goal status: MET     NEW LONG TERM GOALS FOR EXTENDED POC:  Target date: 01/09/2022 (due to delay in scheduling)   Pt will decr cog TUG to 16 seconds or less for decr fall risk.  Baseline: 18.09 seconds, freezing with turns; 19.16s w/significant freezing on 6/15 Goal status: IN PROGRESS  2.  Pt will improve MiniBest to 23/28 for decreased fall risk and improvement with compensatory stepping strategies.  Baseline: 21/28 on 6/15 Goal status: INITIAL  3.  Pt will be independent with final HEP for improved flexibility, strength, balance, transfers, and gait.   Baseline: Pt not performing at home  Goal status: IN PROGRESS  4.  Pt will verbalize understanding of local PD community resources, including fitness post DC.   Baseline: Pt reports interest in rock steady boxing  Goal status: INITIAL   ASSESSMENT:   CLINICAL IMPRESSION: Emphasis of skilled PT session on assessing LTGs and updating POC. Pt is progressing 3 of 4 LTGs and has met 1 goal, increasing his gait speed w/use of U-step walker. New LTGs added due to extended POC and delay in scheduling. Pt's freezing episodes more pronounced today but pt able to maintain balance well. Pt verbalized frustration regarding being told to use his U-step at all times at home, see pt education section for more details. Pt inquiring about fitness options post-DC, will assess next session.  Continue POC.   OBJECTIVE IMPAIRMENTS Abnormal gait, decreased activity tolerance, decreased balance, decreased coordination, decreased knowledge of use of DME, difficulty walking, decreased strength, impaired flexibility, postural dysfunction, and pain.    ACTIVITY LIMITATIONS community activity.    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      REHAB POTENTIAL: Good   CLINICAL DECISION MAKING: Evolving/moderate complexity   EVALUATION COMPLEXITY: Moderate   PLAN: PT FREQUENCY: 2x/week   PT DURATION: 12 weeks   PLANNED INTERVENTIONS: Therapeutic exercises, Therapeutic activity, Neuromuscular re-education, Balance training, Gait training, Patient/Family education, and DME instructions   PLAN FOR NEXT SESSION:  Scheduled out 2x week for 3 more weeks and plan to D/C then with PD screens in 6 months.   Continue to work on freezing strategies with turns.  Work on A/P stepping, sit<>stand functional strengthening and proper technique, turns. Obstacle navigation, resisted walking, lunges at rebounder, balance on unlevel surfaces, ramps, discuss rock steady boxing (maybe try boxing in clinic), anterior weight shifting    Cruzita Lederer Plaster, PT, DPT 11/28/21 9:39 AM

## 2021-11-28 NOTE — Therapy (Signed)
OUTPATIENT SPEECH LANGUAGE PATHOLOGY TREATMENT NOTE   Patient Name: Stanley Wright MRN: [id number] DOB:09/02/1945, 76 y.o., male Today's Date: 11/28/2021  PCP: Scifres, Dorthy, PA-C REFERRING PROVIDER: Ludwig Clarks, DO   END OF SESSION:   End of Session - 11/28/21 0850     Visit Number 13    Number of Visits 17    Date for SLP Re-Evaluation 12/05/21    Authorization Type [employer]    SLP Start Time 0850    SLP Stop Time  0930    SLP Time Calculation (min) 40 min    Activity Tolerance Patient tolerated treatment well              Past Medical History:  Diagnosis Date   Arthritis    BENIGN PROSTATIC HYPERTROPHY, WITH OBSTRUCTION    Cancer ([place])    skin, basal, squamous   COLONIC POLYPS    Diverticulitis    DVT (deep venous thrombosis) ([place]) 2000   Dysrhythmia    Hx Afib- 2017   Early cataract    Factor 5 Leiden mutation, heterozygous ([place])    GERD (gastroesophageal reflux disease)    not on medication   Hearing aid worn    B/L   HIATAL HERNIA    ITP (idiopathic thrombocytopenic purpura) 2003   Long term current use of anticoagulant    Osteoarthritis    right foot   Parkinson's disease ([place]) 02/03/2018   PSORIASIS    Pulmonary embolus ([place]) 2000   Shortness of breath dyspnea    at times - Talking alot   Sleep apnea    Wears glasses    Past Surgical History:  Procedure Laterality Date   ANKLE FUSION Right 08/24/2019   Procedure: RIGHT TALO-NAVICULAR FUSION;  Surgeon: Newt Minion, MD;  Location: [facility];  Service: Orthopedics;  Laterality: Right;   CARDIOVERSION N/A 11/22/2015   Procedure: CARDIOVERSION;  Surgeon: Sanda Klein, MD;  Location: [facility];  Service: Cardiovascular;  Laterality: N/A;   COLONOSCOPY     HERNIA REPAIR Left    Inguinal- x2 . mesh x1   INSERTION OF MESH N/A 02/25/2016   Procedure: INSERTION OF MESH;  Surgeon: Rolm Bookbinder, MD;  Location: [facility];  Service: General;  Laterality: N/A;   LUNG REMOVAL, PARTIAL  Right 2004   was fungal and not cancerous   PROSTATE SURGERY     UMBILICAL HERNIA REPAIR N/A 02/25/2016   Procedure: LAPAROSCOPIC UMBILICAL HERNIA REPAIR;  Surgeon: Rolm Bookbinder, MD;  Location: [facility];  Service: General;  Laterality: N/A;   Patient Active Problem List   Diagnosis Date Noted   Primary osteoarthritis, right ankle and foot    Lower extremity edema 08/10/2019   Acute DVT (deep venous thrombosis) ([place]) 07/19/2019   History of pulmonary embolism 07/19/2019   Parkinson's disease ([place]) 02/03/2018   Community acquired pneumonia of left lower lobe of lung 12/23/2017   Sepsis ([place]) 12/23/2017   Morbid obesity due to excess calories ([place]) 08/23/2016   OSA on CPAP 08/23/2016   Dyspnea on exertion 93/79/0240   Umbilical hernia 97/35/3299   Persistent atrial fibrillation ([place])    Encounter for therapeutic drug monitoring 08/12/2013   Long term current use of anticoagulant 07/30/2010   COLONIC POLYPS 06/03/2010   Factor V Leiden ([place]) 06/03/2010   GERD 06/03/2010   HIATAL HERNIA 06/03/2010   BENIGN PROSTATIC HYPERTROPHY, WITH OBSTRUCTION 06/03/2010   PSORIASIS 06/03/2010    ONSET DATE: referred 10/01/2021, dx with Parkinsonism Sept 2019   REFERRING  DIAG: G20 (ICD-10-CM) - Primary parkinsonism ([place])   THERAPY DIAG: Dysarthria and anarthria  SUBJECTIVE: "fairly well" re: HEP completion   PAIN:  Are you having pain? No Description: n/a  OBJECTIVE:   TODAY'S TREATMENT:  11-28-21: Pt reports some compliance with HEP, not doing daily. Enters room with sub-optimal volume, tells ST he is "not being intentional." SLP leads pt through Speak Out! Lesson 21, without modeling so pt can demonstrate how he's been completing exercises at home. Averages following dB with occasional min-A: Loud ah: 93 dB (~10 seconds) Counting: 87 dB Provide education on using breath support, reduced rate, and increased volume for extended utterance exercises. Usual mod-A required to reduce rate, rare  min-A for intensity.  Reading (passage): 73 dB with increased rate; 65 dB when focusing on reducing rate  Cognitive Ex: 67 dB usual cues for rate/intensity.  Education on pausing, formulating thought, then using intentional speech. Pt able to use this strategy with mod-I following initial period of instruction, resulting in increased clarity of speech through loudness and pacing.   11-25-21: Pt entered with conversational volume averaging upper 60s dB, in which pt identified reduced volume when prompted. Continues to partially complete Speak Out! Lessons due to reported time constraints. SLP leads pt through [place] 20 as pt did not fully complete this lesson at home. Usual min to mod-A to maintain appropriate rate of speech and optimize breath support to increase intelligibility. Occasional min-A for maintaining and increasing intensity during cognitive exercises. Averaged the following dB through structured practice this date: Loud ah: 89 dB (~10 seconds) Counting: 81 dB Reading (passage): 73 dB Cognitive Ex: 72 dB (usual cues to slow rate and occasional cues to increase volume)   PATIENT EDUCATION: Education details: see above Person educated: Patient Education method: Explanation, Demonstration, and Handouts Education comprehension: verbalized understanding, returned demonstration, and needs further education     HOME EXERCISE PROGRAM: Speak Out! Lessons 18, 19, 20   GOALS: Goals reviewed with patient? Yes   SHORT TERM GOALS: Target date: 11/07/2021   Pt will complete dysarthria HEP with mod-I over 2 sessions Baseline: 10-31-2021, 11-05-21 Goal status: met   2.  Pt will average 85 dB in x5 loud "ah" production with rare min-A over 2 sessions Baseline: 10-31-2021, 11-05-21 Goal status: met   3.  Pt will exceed 72 dB in 80% of oral reading trials with usual mod-A over 2 sessions Baseline: 11-05-21, 11-07-21 Goal status: met   4.  Pt will meet dB targets for Speak Out! cognitive  exercise (72+ dB) in 80% of trials with usual mod-A over 2 sessions  Baseline: 11-05-21, 11-05-21 Goal status: partially met     LONG TERM GOALS: Target date: 12/05/2021   Pt will report improved voice related QOL via V-QROL by 10 points (calculated score) by d/c.  Baseline: 67.5 calculated score Goal status: ongoing   2.  Pt will average 70+ dB in 10 minute conversational sample with rare min-A over 2 sessions.  Baseline:  Goal status: ongoing   3.  Pt will report completion of dysarthria HEP, with perceived benefit, at least 1x per day (recommend BID) over 1 week period.  Baseline:  Goal status: ongoing   4.  Pt will average 72+ dB in oral reading trials, paragraph level with rare min-A over 2 sessions Baseline: 11-25-21 Goal status: ongoing   5.  Pt will meet dB targets for Speak Out! cognitive exercise (72+ dB) in 80% of trials with rare min-A over 2 sessions  Baseline:  Goal status: ongoing   ASSESSMENT:   CLINICAL IMPRESSION: Patient is a 76 y.o. 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 Ongoing education and structured practice using intent to elevate pt's communicative effectiveness. ST conducted ongoing training with Speak Out! Program with pt currently requiring occasional min-A for meeting dB targets, usual mod-A for pacing to prevent rushes of speech, and increased cueing and support for carryover to conversational speech. Increasing awareness of decreased loudness throughout sessions. Recommend continued skilled ST.   OBJECTIVE IMPAIRMENTS  Objective impairments include dysarthria. These impairments are limiting patient from effectively communicating at home and in community.Factors affecting potential to achieve goals and functional outcome are medical prognosis. Patient will benefit from skilled SLP services to address above impairments and improve overall function.   REHAB POTENTIAL: Good   PLAN: SLP FREQUENCY: 2x/week   SLP DURATION: 8 weeks    PLANNED INTERVENTIONS: Cueing hierachy, Internal/external aids, Functional tasks, SLP instruction and feedback, Compensatory strategies, and Patient/family education  Su Monks, CCC-SLP 11/28/2021, 8:50 AM

## 2021-12-02 ENCOUNTER — Ambulatory Visit: Payer: PPO | Admitting: Physical Therapy

## 2021-12-02 ENCOUNTER — Ambulatory Visit: Payer: PPO

## 2021-12-02 DIAGNOSIS — R2681 Unsteadiness on feet: Secondary | ICD-10-CM | POA: Diagnosis not present

## 2021-12-02 DIAGNOSIS — R471 Dysarthria and anarthria: Secondary | ICD-10-CM

## 2021-12-02 DIAGNOSIS — R2689 Other abnormalities of gait and mobility: Secondary | ICD-10-CM

## 2021-12-02 DIAGNOSIS — M6281 Muscle weakness (generalized): Secondary | ICD-10-CM

## 2021-12-02 NOTE — Therapy (Signed)
OUTPATIENT PHYSICAL THERAPY TREATMENT NOTE   Patient Name: Stanley Wright MRN: [id number] DOB:September 17, 1945, 76 y.o., male Today's Date: 12/02/2021  PCP: Maude Leriche, PA-C  REFERRING PROVIDER: Ludwig Clarks, DO   END OF SESSION:   PT End of Session - 12/02/21 0804     Visit Number 13    Number of Visits 19    Date for PT Re-Evaluation 01/08/22   due to delay in scheduling   Authorization Type [employer]    PT Start Time 0802    PT Stop Time 0845    PT Time Calculation (min) 43 min    Activity Tolerance Patient tolerated treatment well    Behavior During Therapy WFL for tasks assessed/performed                   Past Medical History:  Diagnosis Date   Arthritis    BENIGN PROSTATIC HYPERTROPHY, WITH OBSTRUCTION    Cancer ([place])    skin, basal, squamous   COLONIC POLYPS    Diverticulitis    DVT (deep venous thrombosis) ([place]) 2000   Dysrhythmia    Hx Afib- 2017   Early cataract    Factor 5 Leiden mutation, heterozygous ([place])    GERD (gastroesophageal reflux disease)    not on medication   Hearing aid worn    B/L   HIATAL HERNIA    ITP (idiopathic thrombocytopenic purpura) 2003   Long term current use of anticoagulant    Osteoarthritis    right foot   Parkinson's disease ([place]) 02/03/2018   PSORIASIS    Pulmonary embolus ([place]) 2000   Shortness of breath dyspnea    at times - Talking alot   Sleep apnea    Wears glasses    Past Surgical History:  Procedure Laterality Date   ANKLE FUSION Right 08/24/2019   Procedure: RIGHT TALO-NAVICULAR FUSION;  Surgeon: Newt Minion, MD;  Location: [facility];  Service: Orthopedics;  Laterality: Right;   CARDIOVERSION N/A 11/22/2015   Procedure: CARDIOVERSION;  Surgeon: Sanda Klein, MD;  Location: [facility];  Service: Cardiovascular;  Laterality: N/A;   COLONOSCOPY     HERNIA REPAIR Left    Inguinal- x2 . mesh x1   INSERTION OF MESH N/A 02/25/2016   Procedure: INSERTION OF MESH;  Surgeon: Rolm Bookbinder, MD;  Location: [facility];  Service: General;  Laterality: N/A;   LUNG REMOVAL, PARTIAL Right 2004   was fungal and not cancerous   PROSTATE SURGERY     UMBILICAL HERNIA REPAIR N/A 02/25/2016   Procedure: LAPAROSCOPIC UMBILICAL HERNIA REPAIR;  Surgeon: Rolm Bookbinder, MD;  Location: [facility];  Service: General;  Laterality: N/A;   Patient Active Problem List   Diagnosis Date Noted   Primary osteoarthritis, right ankle and foot    Lower extremity edema 08/10/2019   Acute DVT (deep venous thrombosis) ([place]) 07/19/2019   History of pulmonary embolism 07/19/2019   Parkinson's disease ([place]) 02/03/2018   Community acquired pneumonia of left lower lobe of lung 12/23/2017   Sepsis ([place]) 12/23/2017   Morbid obesity due to excess calories ([place]) 08/23/2016   OSA on CPAP 08/23/2016   Dyspnea on exertion 37/34/2876   Umbilical hernia 81/15/7262   Persistent atrial fibrillation ([place])    Encounter for therapeutic drug monitoring 08/12/2013   Long term current use of anticoagulant 07/30/2010   COLONIC POLYPS 06/03/2010   Factor V Leiden ([place]) 06/03/2010   GERD 06/03/2010   HIATAL HERNIA 06/03/2010   BENIGN PROSTATIC HYPERTROPHY, WITH  OBSTRUCTION 06/03/2010   PSORIASIS 06/03/2010    REFERRING DIAG: G20 (ICD-10-CM) - Primary parkinsonism ([place])    THERAPY DIAG:  Muscle weakness (generalized)  Unsteadiness on feet  Other abnormalities of gait and mobility  PERTINENT HISTORY:  Parkinsonism, 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    PRECAUTIONS: Fall  SUBJECTIVE: No changes, no falls. Reports he went to church yesterday, did not take his walker and did fine. "I am proud of myself". Stated he went up/down 18 steps and did not freeze going down the pews/aisles. Not performing  HEP, "I am just walking"    PAIN:  Are you having pain? Yes: NPRS scale: 6/10 Pain location: Low back  Pain description: Sharp     TODAY'S TREATMENT:   Therapeutic Exercise:  -Supine posterior pelvic tilts x10 w/5s isometric hold for back pain modulation and spinal mobility.  - SciFit multi-peaks level 9 for 8 minutes with BUE/BLEs for dynamic cardiovascular warmup, neural priming for reciprocal movement and low back pain modulation.   NMR  -Boxing w/two therapists for UE/LE coordination, stepping strategy and thoracic rotation. Min guard throughout for safety: -Blocked pratice contralateral step w/cross-body punch, x10 per side  -Progressed to alt. Contralateral step w/cross-body punch, x15 oer side. Mod verbal cues for proper sequencing and increased step length, as pt frequently regressed to ipsilateral step and punch and decreased length of step (RLE>LLE).  -Added ipsilateral punch to above sequence, pt demonstrated significant difficulty w/UE/LE coordination and frequently regressed to punching only despite max multimodal cues.   -Sit <>stands w/blue wedge under feet for posterior bias to work on anterior weight shifting and BLE strength. Pt performed 10 reps but was unable to perform without UE support. Min A for hip extension assistance at top of stand.  -Sit <>stands without blue wedge x7 without UE support. Pt about to perform 5/7 without retropulsion. Encouraged pt to continue to practice at home for improved transfers and BLE strength.       GAIT: Gait pattern: step through pattern, decreased arm swing- Right, decreased arm swing- Left, decreased step length- Left, decreased stance time- Right, decreased stride length, decreased ankle dorsiflexion- Right, and shuffling Distance walked: Distances throughout session.  Level of assistance: supervision  Comments: Cued for stride length and posture.    PATIENT EDUCATION: Education details: Practicing proper sit <>stands,  safety w/walking program  Person educated: Patient Education method: Explanation and demonstration  Education comprehension: verbalized and demonstration understanding, needs further instructions.      HOME EXERCISE PROGRAM:  Seated PWR moves   Access Code: [email or web address] URL: [email or web address] Date: 10/18/2021 Prepared by: Mickie Bail Plaster  Exercises - Sit to Stand Without Arm Support  - 1 x daily - 7 x weekly - 3 sets - 10 reps - Proper Sit to Stand Technique  - 1 x daily - 7 x weekly - 3 sets - 10 reps - Seated Shoulder Diagonal Pulls with Resistance  - 1 x daily - 7 x weekly - 3 sets - 10 reps - Lateral Weight Shift with Arm Raise  - 1 x daily - 7 x weekly - 3 sets - 10 reps - Standing Quarter Turn with Counter Support  - 1 x daily - 7 x weekly - 3 sets - 10 reps       GOALS:  Goals reviewed with patient? Yes   SHORT TERM GOALS: Target date: 11/07/2021   Pt will be independent with initial HEP for improved flexibility, strength, balance, transfers, and gait.     Baseline: Goal status: MET    2.  Pt will verbalize/demo understanding of tips to reduce festination with turns and gait.  Baseline:  Goal status: MET    3.  Pt will perform 10 reps of sit <>stands with supervision and BUE support without episodes of BLE  bracing in order to demo improved safety w/ transfers Baseline:  Goal status: MET   4.  Pt will undergo further assessment of miniBEST w/ LTG written. Baseline: 17/28 Goal status: MET   5.  Pt will decr manual TUG to 15 seconds or less for decr fall risk.  Baseline: 17.19 seconds, festination w/ initiation of gait; 11.56s, minor freezing w/turn  Goal status: MET     LONG TERM GOALS: Target date: 12/05/2021   Pt will be independent with final HEP for improved flexibility, strength, balance, transfers, and gait.   Baseline:  Goal status: PROGRESSING    2.  Pt will improve MiniBest to 22/28 for decreased fall risk and improvement with  compensatory stepping strategies.   Baseline: 17/28; 21/28 (6/15) Goal status: PROGRESSING   3.  Pt will decr cog TUG to 16 seconds or less for decr fall risk.  Baseline: 18.09 seconds, freezing with turns; 19.16s w/significant freezing on 6/15 Goal status: PROGRESSING    4.  Pt will improve gait speed with U-Step Walker to at least 2.7 ft/sec in order to demo improved community mobility.    Baseline: 2.38 ft/sec with U-step Walker; 2.94 ft/sec on 11/21/21 Goal status: MET     NEW LONG TERM GOALS FOR EXTENDED POC:  Target date: 01/09/2022 (due to delay in scheduling)   Pt will decr cog TUG to 16 seconds or less for decr fall risk.  Baseline: 18.09 seconds, freezing with turns; 19.16s w/significant freezing on 6/15 Goal status: IN PROGRESS  2.  Pt will improve MiniBest to 23/28 for decreased fall risk and improvement with compensatory stepping strategies.  Baseline: 21/28 on 6/15 Goal status: INITIAL  3.  Pt will be independent with final HEP for improved flexibility, strength, balance, transfers, and gait.   Baseline: Pt not performing at home  Goal status: IN PROGRESS  4.  Pt will verbalize understanding of local PD community resources, including fitness post DC.   Baseline: Pt reports interest in rock steady boxing  Goal status: INITIAL   ASSESSMENT:   CLINICAL IMPRESSION: Emphasis of skilled PT session on UE/LE coordination and transfers. Pt reported new low back pain at beginning of session which reduced by end of session w/activity. Pt continues to demonstrate difficulty w/reciprocal sequencing but demonstrated good thoracic rotation and balance w/boxing. Pt able to perform sit <>stands w/posterior bias w/use of Ues but has notable difficulty w/anterior weight shift and maintaining balance once standing without UE support. Encouraged pt to practice at home for carryover from PT and increased BLE strength. Continue POC.   OBJECTIVE IMPAIRMENTS Abnormal gait, decreased  activity tolerance, decreased balance, decreased coordination, decreased knowledge of use of DME, difficulty walking, decreased strength, impaired flexibility, postural dysfunction, and pain.    ACTIVITY LIMITATIONS community activity.    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 Doristine Devoid notes), cervical dystonia, a fib, R ankle fusion 2021,  arthritis,DVT, A-fib, GERD, sleep  apnea   are also affecting patient's functional outcome.      REHAB POTENTIAL: Good   CLINICAL DECISION MAKING: Evolving/moderate complexity   EVALUATION COMPLEXITY: Moderate   PLAN: PT FREQUENCY: 2x/week   PT DURATION: 12 weeks   PLANNED INTERVENTIONS: Therapeutic exercises, Therapeutic activity, Neuromuscular re-education, Balance training, Gait training, Patient/Family education, and DME instructions   PLAN FOR NEXT SESSION:  Scheduled out 2x week for 3 more weeks and plan to D/C then with PD screens in 6 months.   Continue to work on freezing strategies with turns.  Work on A/P stepping, sit<>stand functional strengthening and proper technique, turns. Obstacle navigation, resisted walking, lunges at rebounder, balance on unlevel surfaces, ramps, discuss rock steady boxing, anterior weight shifting    Cruzita Lederer Plaster, PT, DPT 12/02/21 9:11 AM

## 2021-12-02 NOTE — Therapy (Signed)
OUTPATIENT SPEECH LANGUAGE PATHOLOGY TREATMENT NOTE   Patient Name: Stanley Wright MRN: [id number] DOB:08-30-1945, 76 y.o., male Today's Date: 12/02/2021  PCP: Scifres, Dorthy, PA-C REFERRING PROVIDER: Ludwig Clarks, DO   END OF SESSION:   End of Session - 12/02/21 0825     Visit Number 14    Number of Visits 17    Date for SLP Re-Evaluation 12/05/21    Authorization Type [employer]    SLP Start Time 0845    SLP Stop Time  0925    SLP Time Calculation (min) 40 min    Activity Tolerance Patient tolerated treatment well              Past Medical History:  Diagnosis Date   Arthritis    BENIGN PROSTATIC HYPERTROPHY, WITH OBSTRUCTION    Cancer ([place])    skin, basal, squamous   COLONIC POLYPS    Diverticulitis    DVT (deep venous thrombosis) ([place]) 2000   Dysrhythmia    Hx Afib- 2017   Early cataract    Factor 5 Leiden mutation, heterozygous ([place])    GERD (gastroesophageal reflux disease)    not on medication   Hearing aid worn    B/L   HIATAL HERNIA    ITP (idiopathic thrombocytopenic purpura) 2003   Long term current use of anticoagulant    Osteoarthritis    right foot   Parkinson's disease ([place]) 02/03/2018   PSORIASIS    Pulmonary embolus ([place]) 2000   Shortness of breath dyspnea    at times - Talking alot   Sleep apnea    Wears glasses    Past Surgical History:  Procedure Laterality Date   ANKLE FUSION Right 08/24/2019   Procedure: RIGHT TALO-NAVICULAR FUSION;  Surgeon: Newt Minion, MD;  Location: [facility];  Service: Orthopedics;  Laterality: Right;   CARDIOVERSION N/A 11/22/2015   Procedure: CARDIOVERSION;  Surgeon: Sanda Klein, MD;  Location: [facility];  Service: Cardiovascular;  Laterality: N/A;   COLONOSCOPY     HERNIA REPAIR Left    Inguinal- x2 . mesh x1   INSERTION OF MESH N/A 02/25/2016   Procedure: INSERTION OF MESH;  Surgeon: Rolm Bookbinder, MD;  Location: [facility];  Service: General;  Laterality: N/A;   LUNG REMOVAL, PARTIAL  Right 2004   was fungal and not cancerous   PROSTATE SURGERY     UMBILICAL HERNIA REPAIR N/A 02/25/2016   Procedure: LAPAROSCOPIC UMBILICAL HERNIA REPAIR;  Surgeon: Rolm Bookbinder, MD;  Location: [facility];  Service: General;  Laterality: N/A;   Patient Active Problem List   Diagnosis Date Noted   Primary osteoarthritis, right ankle and foot    Lower extremity edema 08/10/2019   Acute DVT (deep venous thrombosis) ([place]) 07/19/2019   History of pulmonary embolism 07/19/2019   Parkinson's disease ([place]) 02/03/2018   Community acquired pneumonia of left lower lobe of lung 12/23/2017   Sepsis ([place]) 12/23/2017   Morbid obesity due to excess calories ([place]) 08/23/2016   OSA on CPAP 08/23/2016   Dyspnea on exertion 66/59/9357   Umbilical hernia 01/77/9390   Persistent atrial fibrillation ([place])    Encounter for therapeutic drug monitoring 08/12/2013   Long term current use of anticoagulant 07/30/2010   COLONIC POLYPS 06/03/2010   Factor V Leiden ([place]) 06/03/2010   GERD 06/03/2010   HIATAL HERNIA 06/03/2010   BENIGN PROSTATIC HYPERTROPHY, WITH OBSTRUCTION 06/03/2010   PSORIASIS 06/03/2010    ONSET DATE: referred 10/01/2021, dx with Parkinsonism Sept 2019   REFERRING  DIAG: G20 (ICD-10-CM) - Primary parkinsonism ([place])   THERAPY DIAG: Dysarthria and anarthria  SUBJECTIVE: "some" re: HEP completion   PAIN:  Are you having pain? No Description: n/a  OBJECTIVE:   TODAY'S TREATMENT:  12-02-21: Pt entered with low 60s dB conversational volume, which improved mid to upper 60s dB (range from 63 to 68 dB) with frequent SLP cues to be loud, intentional, and "speak out" in opening conversation. SLP educated and instructed patient to raise elevate chin/establish eye contact versus pointing head towards floor which projected voice downwards and impacted listener comprehension. Intermittent cues require to achieve neutral head positioning throughout session. Reportedly completed Speak Out! Program since  last ST session, in which SLP re-educated re-starting Speak Out! Program from beginning as pt would continue to benefit due to inconsistent carryover. Utilized warm up exercises and cognitive exercises to prepare speech for less structured conversation, with overall average of 68 dB in conversation given usual prompting and SLP modeling.   11-28-21: Pt reports some compliance with HEP, not doing daily. Enters room with sub-optimal volume, tells ST he is "not being intentional." SLP leads pt through Speak Out! Lesson 21, without modeling so pt can demonstrate how he's been completing exercises at home. Averages following dB with occasional min-A: Loud ah: 93 dB (~10 seconds) Counting: 87 dB Provide education on using breath support, reduced rate, and increased volume for extended utterance exercises. Usual mod-A required to reduce rate, rare min-A for intensity.  Reading (passage): 73 dB with increased rate; 65 dB when focusing on reducing rate  Cognitive Ex: 67 dB usual cues for rate/intensity.  Education on pausing, formulating thought, then using intentional speech. Pt able to use this strategy with mod-I following initial period of instruction, resulting in increased clarity of speech through loudness and pacing.   11-25-21: Pt entered with conversational volume averaging upper 60s dB, in which pt identified reduced volume when prompted. Continues to partially complete Speak Out! Lessons due to reported time constraints. SLP leads pt through [place] 20 as pt did not fully complete this lesson at home. Usual min to mod-A to maintain appropriate rate of speech and optimize breath support to increase intelligibility. Occasional min-A for maintaining and increasing intensity during cognitive exercises. Averaged the following dB through structured practice this date: Loud ah: 89 dB (~10 seconds) Counting: 81 dB Reading (passage): 73 dB Cognitive Ex: 72 dB (usual cues to slow rate and occasional cues to  increase volume)   PATIENT EDUCATION: Education details: see above Person educated: Patient Education method: Explanation, Demonstration, and Handouts Education comprehension: verbalized understanding, returned demonstration, and needs further education     HOME EXERCISE PROGRAM: Speak Out!    GOALS: Goals reviewed with patient? Yes   SHORT TERM GOALS: Target date: 11/07/2021   Pt will complete dysarthria HEP with mod-I over 2 sessions Baseline: 10-31-2021, 11-05-21 Goal status: met   2.  Pt will average 85 dB in x5 loud "ah" production with rare min-A over 2 sessions Baseline: 10-31-2021, 11-05-21 Goal status: met   3.  Pt will exceed 72 dB in 80% of oral reading trials with usual mod-A over 2 sessions Baseline: 11-05-21, 11-07-21 Goal status: met   4.  Pt will meet dB targets for Speak Out! cognitive exercise (72+ dB) in 80% of trials with usual mod-A over 2 sessions  Baseline: 11-05-21, 11-05-21 Goal status: partially met     LONG TERM GOALS: Target date: 12/05/2021   Pt will report improved voice related QOL via V-QROL  by 10 points (calculated score) by d/c.  Baseline: 67.5 calculated score Goal status: ongoing   2.  Pt will average 70+ dB in 10 minute conversational sample with rare min-A over 2 sessions.  Baseline:  Goal status: ongoing   3.  Pt will report completion of dysarthria HEP, with perceived benefit, at least 1x per day (recommend BID) over 1 week period.  Baseline:  Goal status: ongoing   4.  Pt will average 72+ dB in oral reading trials, paragraph level with rare min-A over 2 sessions Baseline: 11-25-21 Goal status: ongoing   5.  Pt will meet dB targets for Speak Out! cognitive exercise (72+ dB) in 80% of trials with rare min-A over 2 sessions  Baseline:  Goal status: ongoing   ASSESSMENT:   CLINICAL IMPRESSION: Patient is a 76 y.o. M who was seen today for dysarthria 2/2 Parkinsonism. Ongoing education and structured practice using intent to elevate  pt's communicative effectiveness. ST conducted ongoing training with Speak Out! Program with pt currently requiring occasional min-A for meeting dB targets, usual mod-A for pacing to prevent rushes of speech, and frequent cueing and prompting for carryover to conversational speech. Increasing awareness of decreased loudness exhibited throughout sessions, although limited independent self-corrections exhibited. Recommend continued skilled ST to maximize communication effectiveness.   OBJECTIVE IMPAIRMENTS  Objective impairments include dysarthria. These impairments are limiting patient from effectively communicating at home and in community.Factors affecting potential to achieve goals and functional outcome are medical prognosis. Patient will benefit from skilled SLP services to address above impairments and improve overall function.   REHAB POTENTIAL: Good   PLAN: SLP FREQUENCY: 2x/week   SLP DURATION: 8 weeks   PLANNED INTERVENTIONS: Cueing hierachy, Internal/external aids, Functional tasks, SLP instruction and feedback, Compensatory strategies, and Patient/family education  Marzetta Board, CCC-SLP 12/02/2021, 9:29 AM

## 2021-12-03 ENCOUNTER — Ambulatory Visit (INDEPENDENT_AMBULATORY_CARE_PROVIDER_SITE_OTHER): Payer: PPO | Admitting: *Deleted

## 2021-12-03 DIAGNOSIS — D6851 Activated protein C resistance: Secondary | ICD-10-CM | POA: Diagnosis not present

## 2021-12-03 DIAGNOSIS — I4819 Other persistent atrial fibrillation: Secondary | ICD-10-CM | POA: Diagnosis not present

## 2021-12-03 DIAGNOSIS — Z5181 Encounter for therapeutic drug level monitoring: Secondary | ICD-10-CM | POA: Diagnosis not present

## 2021-12-03 LAB — POCT INR: INR: 2.1 (ref 2.0–3.0)

## 2021-12-03 NOTE — Patient Instructions (Signed)
Description   Continue taking 1 tablet daily except for 1.5 tablets on Mondays. Recheck INR in 3 weeks.  [facility] [phone] or [phone]

## 2021-12-04 DIAGNOSIS — M542 Cervicalgia: Secondary | ICD-10-CM | POA: Diagnosis not present

## 2021-12-04 DIAGNOSIS — M47812 Spondylosis without myelopathy or radiculopathy, cervical region: Secondary | ICD-10-CM | POA: Diagnosis not present

## 2021-12-04 DIAGNOSIS — M9901 Segmental and somatic dysfunction of cervical region: Secondary | ICD-10-CM | POA: Diagnosis not present

## 2021-12-04 DIAGNOSIS — G44209 Tension-type headache, unspecified, not intractable: Secondary | ICD-10-CM | POA: Diagnosis not present

## 2021-12-05 ENCOUNTER — Encounter: Payer: Self-pay | Admitting: Physical Therapy

## 2021-12-05 ENCOUNTER — Ambulatory Visit: Payer: PPO | Admitting: Physical Therapy

## 2021-12-05 DIAGNOSIS — R2681 Unsteadiness on feet: Secondary | ICD-10-CM | POA: Diagnosis not present

## 2021-12-05 DIAGNOSIS — M6281 Muscle weakness (generalized): Secondary | ICD-10-CM

## 2021-12-05 DIAGNOSIS — R2689 Other abnormalities of gait and mobility: Secondary | ICD-10-CM

## 2021-12-05 NOTE — Therapy (Signed)
OUTPATIENT PHYSICAL THERAPY TREATMENT NOTE   Patient Name: Stanley Wright MRN: [id number] DOB:Jul 31, 1945, 76 y.o., male Today's Date: 12/05/2021  PCP: Maude Leriche, PA-C  REFERRING PROVIDER: Ludwig Clarks, DO   END OF SESSION:   PT End of Session - 12/05/21 0852     Visit Number 14    Number of Visits 19    Date for PT Re-Evaluation 01/08/22   due to delay in scheduling   Authorization Type [employer]    PT Start Time [phone]    PT Stop Time 0932    PT Time Calculation (min) 41 min    Equipment Utilized During Treatment Gait belt    Activity Tolerance Patient tolerated treatment well    Behavior During Therapy WFL for tasks assessed/performed                   Past Medical History:  Diagnosis Date   Arthritis    BENIGN PROSTATIC HYPERTROPHY, WITH OBSTRUCTION    Cancer ([place])    skin, basal, squamous   COLONIC POLYPS    Diverticulitis    DVT (deep venous thrombosis) ([place]) 2000   Dysrhythmia    Hx Afib- 2017   Early cataract    Factor 5 Leiden mutation, heterozygous ([place])    GERD (gastroesophageal reflux disease)    not on medication   Hearing aid worn    B/L   HIATAL HERNIA    ITP (idiopathic thrombocytopenic purpura) 2003   Long term current use of anticoagulant    Osteoarthritis    right foot   Parkinson's disease ([place]) 02/03/2018   PSORIASIS    Pulmonary embolus ([place]) 2000   Shortness of breath dyspnea    at times - Talking alot   Sleep apnea    Wears glasses    Past Surgical History:  Procedure Laterality Date   ANKLE FUSION Right 08/24/2019   Procedure: RIGHT TALO-NAVICULAR FUSION;  Surgeon: Newt Minion, MD;  Location: [facility];  Service: Orthopedics;  Laterality: Right;   CARDIOVERSION N/A 11/22/2015   Procedure: CARDIOVERSION;  Surgeon: Sanda Klein, MD;  Location: [facility];  Service: Cardiovascular;  Laterality: N/A;   COLONOSCOPY     HERNIA REPAIR Left    Inguinal- x2 . mesh x1   INSERTION OF MESH N/A 02/25/2016    Procedure: INSERTION OF MESH;  Surgeon: Rolm Bookbinder, MD;  Location: [facility];  Service: General;  Laterality: N/A;   LUNG REMOVAL, PARTIAL Right 2004   was fungal and not cancerous   PROSTATE SURGERY     UMBILICAL HERNIA REPAIR N/A 02/25/2016   Procedure: LAPAROSCOPIC UMBILICAL HERNIA REPAIR;  Surgeon: Rolm Bookbinder, MD;  Location: [facility];  Service: General;  Laterality: N/A;   Patient Active Problem List   Diagnosis Date Noted   Primary osteoarthritis, right ankle and foot    Lower extremity edema 08/10/2019   Acute DVT (deep venous thrombosis) ([name]) 07/19/2019   History of pulmonary embolism 07/19/2019   Parkinson's disease ([place]) 02/03/2018   Community acquired pneumonia of left lower lobe of lung 12/23/2017   Sepsis ([place]) 12/23/2017   Morbid obesity due to excess calories ([place]) 08/23/2016   OSA on CPAP 08/23/2016   Dyspnea on exertion 11/91/4782   Umbilical hernia 95/62/1308   Persistent atrial fibrillation ([place])    Encounter for therapeutic drug monitoring 08/12/2013   Long term current use of anticoagulant 07/30/2010   COLONIC POLYPS 06/03/2010   Factor V Leiden ([place]) 06/03/2010   GERD 06/03/2010  HIATAL HERNIA 06/03/2010   BENIGN PROSTATIC HYPERTROPHY, WITH OBSTRUCTION 06/03/2010   PSORIASIS 06/03/2010    REFERRING DIAG: G20 (ICD-10-CM) - Primary parkinsonism ([place])    THERAPY DIAG:  Muscle weakness (generalized)  Unsteadiness on feet  Other abnormalities of gait and mobility  PERTINENT HISTORY:  Parkinsonism, 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    PRECAUTIONS: Fall  SUBJECTIVE: No falls. Has not taken his walker out of the car since he was last here. Is not using any AD in the house. Notices that he has more shuffling and festination when  carrying items in the house.    PAIN:  Are you having pain? Yes: NPRS scale: 6/10 Pain location: Low back  Pain description: Sharp     TODAY'S TREATMENT:   Self-Care: Pt reporting that he is no longer using U-Step Rollator in the house and is just leaving it in the car as he feels like he does not need it/want to use it anymore and is ambulating with no AD in the house (and has to use furniture in the house to help him keep his balance as he needs to). Had very frank discussion with pt's fall risk and hx of falls that this would not be the safest option due to pt having significant freezing episodes at times when he has not used any AD and that if he is using furniture to help keep his balance then he should really be using an AD. Discussed that if pt has a fall and hits his head/breaks his hip etc., then pt could have a significant injury and could set back his progress.  Discussed that PT wants to make sure that pt is safe with functional mobility and gait and to help decr freezing episodes/help with strategies for safety. Pt also reports that he has difficulty with walking and carrying objects such as magazines, papers etc. Educated that pt could put these items on top of or in the basket of his U-Step rollator to help safely move them in his house vs. Manual dual tasking with no AD. This PT recommending use of U-Step Rollator at all times(in the house and community) for safety with gait/balance, but pt not wanting to use it at all times despite education and benefits of using one. Discussed if he does not want to use it all times, at least PT recommending a walking stick for balance vs. No AD. Practiced with the walking stick in RUE with pt able to perform well during session today with gait and turns and small distances in the kitchen. Showed pt where to purchase one on [address] (pt currently has a walking stick, but it is not even on the bottom).   Discussed safety in the kitchen since pt reports  that he has to carry objects like a heavy crock pot or his air fryer. Discussed that pt could use his U-Step rollator to carry these or a better solution would be that to clear off counter space on top of the cabinet he keeps them in so he would not have to worry about carrying it or transporting them across the kitchen, pt reports that he could do this at home.    NMR   In // bars: With resisted belt around pt's pelvis and PT tech providing steady resistance and  then adding in perturbations for pt to recover with stepping strategy, x20 reps each side, cued for big step out to the side and taking one step back to the middle (when stepping back with RLE pt tends to take a couple steps), pt able to maintain balance and recover with taking a few steps for balance, pt needing min guard at times for balance.     GAIT: Gait pattern: step through pattern, decreased arm swing- Right, decreased arm swing- Left, decreased step length- Left, decreased stance time- Right, decreased stride length, decreased ankle dorsiflexion- Right, and shuffling Distance walked: Distances throughout session.  Level of assistance: supervision  Comments: Cued for stride length and posture.  Pt ambulating in and out of session with U-Step Rollator.   Used single walking pole during session in RUE with initial cues for technique and sequencing with incr step length. Pt did better with pole in RUE vs. LUE. Pt demonstrating no episodes of freezing with pole and with consistent step length today.  Perform TUG x4 reps with single walking pole in RUE, working on sit <> stands with incr forward lean and walking a small distance and turning and then coming back to chair. Pt able to use technique of counting steps when turning and pt with no freezing/festination episodes.    PATIENT EDUCATION: Education details: See Self-Care section above.  Person educated: Patient Education method: Explanation and demonstration  Education  comprehension: verbalized and demonstration understanding, needs further instructions.      HOME EXERCISE PROGRAM:  Seated PWR moves   Access Code: [email or web address] URL: [email or web address] Date: 10/18/2021 Prepared by: Mickie Bail Plaster  Exercises - Sit to Stand Without Arm Support  - 1 x daily - 7 x weekly - 3 sets - 10 reps - Proper Sit to Stand Technique  - 1 x daily - 7 x weekly - 3 sets - 10 reps - Seated Shoulder Diagonal Pulls with Resistance  - 1 x daily - 7 x weekly - 3 sets - 10 reps - Lateral Weight Shift with Arm Raise  - 1 x daily - 7 x weekly - 3 sets - 10 reps - Standing Quarter Turn with Counter Support  - 1 x daily - 7 x weekly - 3 sets - 10 reps       GOALS: Goals reviewed with patient? Yes   SHORT TERM GOALS: Target date: 11/07/2021   Pt will be independent with initial HEP for improved flexibility, strength, balance, transfers, and gait.     Baseline: Goal status: MET    2.  Pt will verbalize/demo understanding of tips to reduce festination with turns and gait.  Baseline:  Goal status: MET    3.  Pt will perform 10 reps of sit <>stands with supervision and BUE support without episodes of BLE  bracing in order to demo improved safety w/ transfers Baseline:  Goal status: MET   4.  Pt will undergo further assessment of miniBEST w/ LTG written. Baseline: 17/28 Goal status: MET   5.  Pt will decr manual TUG to 15 seconds or less for decr fall risk.  Baseline: 17.19 seconds, festination w/ initiation of gait; 11.56s, minor freezing w/turn  Goal status: MET     LONG TERM GOALS: Target date: 12/05/2021   Pt will be independent with final HEP for improved flexibility, strength, balance, transfers, and gait.   Baseline:  Goal status: PROGRESSING    2.  Pt will improve MiniBest to 22/28 for decreased fall risk and improvement  with compensatory stepping strategies.   Baseline: 17/28; 21/28 (6/15) Goal status: PROGRESSING   3.  Pt will decr cog TUG  to 16 seconds or less for decr fall risk.  Baseline: 18.09 seconds, freezing with turns; 19.16s w/significant freezing on 6/15 Goal status: PROGRESSING    4.  Pt will improve gait speed with U-Step Walker to at least 2.7 ft/sec in order to demo improved community mobility.    Baseline: 2.38 ft/sec with U-step Walker; 2.94 ft/sec on 11/21/21 Goal status: MET     NEW LONG TERM GOALS FOR EXTENDED POC:  Target date: 01/09/2022 (due to delay in scheduling)   Pt will decr cog TUG to 16 seconds or less for decr fall risk.  Baseline: 18.09 seconds, freezing with turns; 19.16s w/significant freezing on 6/15 Goal status: IN PROGRESS  2.  Pt will improve MiniBest to 23/28 for decreased fall risk and improvement with compensatory stepping strategies.  Baseline: 21/28 on 6/15 Goal status: INITIAL  3.  Pt will be independent with final HEP for improved flexibility, strength, balance, transfers, and gait.   Baseline: Pt not performing at home  Goal status: IN PROGRESS  4.  Pt will verbalize understanding of local PD community resources, including fitness post DC.   Baseline: Pt reports interest in rock steady boxing  Goal status: INITIAL   ASSESSMENT:   CLINICAL IMPRESSION: Had long discussion today regarding fall prevention and use of appropriate AD to assist with mobility (see self-care section for further information). Pt has not been using any AD in the house for the past week and has just been keeping his U-Step Rollator in his car and just using it for community mobility (pt not willing to use it at all times despite education for functional use/safety/decr freezing episodes/improving balance). Practiced using a single walking pole in RUE for household mobility, as would rather have pt use an AD for balance vs. None. Pt performed well with walking stick and showed where pt could purchase from [employer]. Will continue to progress towards LTGs.    OBJECTIVE IMPAIRMENTS Abnormal gait, decreased  activity tolerance, decreased balance, decreased coordination, decreased knowledge of use of DME, difficulty walking, decreased strength, impaired flexibility, postural dysfunction, and pain.    ACTIVITY LIMITATIONS community activity.    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      REHAB POTENTIAL: Good   CLINICAL DECISION MAKING: Evolving/moderate complexity   EVALUATION COMPLEXITY: Moderate   PLAN: PT FREQUENCY: 2x/week   PT DURATION: 12 weeks   PLANNED INTERVENTIONS: Therapeutic exercises, Therapeutic activity, Neuromuscular re-education, Balance training, Gait training, Patient/Family education, and DME instructions   PLAN FOR NEXT SESSION:  Scheduled out 2x week for 3 more weeks and plan to D/C then with PD screens in 6 months.   Continue to work on freezing strategies with turns.  Work on A/P stepping, sit<>stand functional strengthening and proper technique, turns. Obstacle navigation, resisted walking, lunges at rebounder, balance on unlevel surfaces, ramps, discuss rock steady boxing, anterior weight shifting     Janann August, PT, DPT 12/05/21 10:13 AM

## 2021-12-06 ENCOUNTER — Encounter: Payer: Self-pay | Admitting: Nurse Practitioner

## 2021-12-06 ENCOUNTER — Ambulatory Visit: Payer: PPO | Admitting: Nurse Practitioner

## 2021-12-06 VITALS — BP 116/60 | HR 92 | Ht 72.0 in | Wt 244.6 lb

## 2021-12-06 DIAGNOSIS — G20 Parkinson's disease: Secondary | ICD-10-CM

## 2021-12-06 DIAGNOSIS — Z7901 Long term (current) use of anticoagulants: Secondary | ICD-10-CM | POA: Diagnosis not present

## 2021-12-06 DIAGNOSIS — D6851 Activated protein C resistance: Secondary | ICD-10-CM

## 2021-12-06 DIAGNOSIS — I4821 Permanent atrial fibrillation: Secondary | ICD-10-CM

## 2021-12-06 DIAGNOSIS — R600 Localized edema: Secondary | ICD-10-CM | POA: Diagnosis not present

## 2021-12-06 DIAGNOSIS — Z86711 Personal history of pulmonary embolism: Secondary | ICD-10-CM | POA: Diagnosis not present

## 2021-12-09 ENCOUNTER — Ambulatory Visit: Payer: PPO | Admitting: Physical Therapy

## 2021-12-09 DIAGNOSIS — R2689 Other abnormalities of gait and mobility: Secondary | ICD-10-CM

## 2021-12-09 DIAGNOSIS — R2681 Unsteadiness on feet: Secondary | ICD-10-CM | POA: Diagnosis not present

## 2021-12-09 DIAGNOSIS — R293 Abnormal posture: Secondary | ICD-10-CM

## 2021-12-09 NOTE — Therapy (Signed)
OUTPATIENT PHYSICAL THERAPY TREATMENT NOTE   Patient Name: Stanley Wright MRN: [id number] DOB:02-08-1946, 76 y.o., male Today's Date: 12/09/2021  PCP: Clementeen Graham, PA-C  REFERRING PROVIDER: Vladimir Faster, DO   END OF SESSION:   PT End of Session - 12/09/21 0804     Visit Number 15    Number of Visits 19    Date for PT Re-Evaluation 01/08/22   due to delay in scheduling   Authorization Type [employer]    PT Start Time 0803    PT Stop Time 0843    PT Time Calculation (min) 40 min    Equipment Utilized During Treatment Gait belt    Activity Tolerance Patient tolerated treatment well    Behavior During Therapy WFL for tasks assessed/performed                    Past Medical History:  Diagnosis Date   Arthritis    BENIGN PROSTATIC HYPERTROPHY, WITH OBSTRUCTION    Cancer (HCC)    skin, basal, squamous   COLONIC POLYPS    Diverticulitis    DVT (deep venous thrombosis) (HCC) 2000   Dysrhythmia    Hx Afib- 2017   Early cataract    Factor 5 Leiden mutation, heterozygous (HCC)    GERD (gastroesophageal reflux disease)    not on medication   Hearing aid worn    B/L   HIATAL HERNIA    ITP (idiopathic thrombocytopenic purpura) 2003   Long term current use of anticoagulant    Osteoarthritis    right foot   Parkinson's disease (HCC) 02/03/2018   PSORIASIS    Pulmonary embolus (HCC) 2000   Shortness of breath dyspnea    at times - Talking alot   Sleep apnea    Wears glasses    Past Surgical History:  Procedure Laterality Date   ANKLE FUSION Right 08/24/2019   Procedure: RIGHT TALO-NAVICULAR FUSION;  Surgeon: Nadara Mustard, MD;  Location: [facility] OR;  Service: Orthopedics;  Laterality: Right;   CARDIOVERSION N/A 11/22/2015   Procedure: CARDIOVERSION;  Surgeon: Thurmon Fair, MD;  Location: [facility] ENDOSCOPY;  Service: Cardiovascular;  Laterality: N/A;   COLONOSCOPY     HERNIA REPAIR Left    Inguinal- x2 . mesh x1   INSERTION OF MESH N/A 02/25/2016    Procedure: INSERTION OF MESH;  Surgeon: Emelia Loron, MD;  Location: [facility] OR;  Service: General;  Laterality: N/A;   LUNG REMOVAL, PARTIAL Right 2004   was fungal and not cancerous   PROSTATE SURGERY     UMBILICAL HERNIA REPAIR N/A 02/25/2016   Procedure: LAPAROSCOPIC UMBILICAL HERNIA REPAIR;  Surgeon: Emelia Loron, MD;  Location: [facility] OR;  Service: General;  Laterality: N/A;   Patient Active Problem List   Diagnosis Date Noted   Primary osteoarthritis, right ankle and foot    Lower extremity edema 08/10/2019   Acute DVT (deep venous thrombosis) (HCC) 07/19/2019   History of pulmonary embolism 07/19/2019   Parkinson's disease (HCC) 02/03/2018   Community acquired pneumonia of left lower lobe of lung 12/23/2017   Sepsis (HCC) 12/23/2017   Morbid obesity due to excess calories (HCC) 08/23/2016   OSA on CPAP 08/23/2016   Dyspnea on exertion 08/22/2016   Umbilical hernia 02/25/2016   Persistent atrial fibrillation (HCC)    Encounter for therapeutic drug monitoring 08/12/2013   Long term current use of anticoagulant 07/30/2010   COLONIC POLYPS 06/03/2010   Factor V Leiden (HCC) 06/03/2010   GERD 06/03/2010  HIATAL HERNIA 06/03/2010   BENIGN PROSTATIC HYPERTROPHY, WITH OBSTRUCTION 06/03/2010   PSORIASIS 06/03/2010    REFERRING DIAG: G20 (ICD-10-CM) - Primary parkinsonism (HCC)    THERAPY DIAG:  Unsteadiness on feet  Other abnormalities of gait and mobility  Abnormal posture  PERTINENT HISTORY:  Parkinsonism, 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 Don Perking notes), cervical dystonia, a fib, R ankle fusion 2021,  arthritis,DVT, A-fib, GERD, sleep apnea. 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    PRECAUTIONS: Fall  SUBJECTIVE: No falls. Had to turn around this morning to go back and get his walker, as he only uses it for therapy and not at home. Is not doing his HEP, "I am just walking". Still having  difficulty carrying heavy things around the house.    PAIN:  Are you having pain? No     TODAY'S TREATMENT:  Ther Ex  SciFit multi-peaks level 10 for 9 minutes using BUE/BLEs for neural priming for reciprocal movement, endurance and dynamic cardiovascular warmup.    NMR  Practiced carrying heavy crate around clinic, into/out of doorways and around kitchen to imitate carrying crock pot/air fryer/ gas cans in pt's home. Noted pt's freezing increased with turns, doorways and when approaching obstacles. Min cues to "stop and reset", but pt demonstrated good lateral weight shifting throughout. Started weight at 10# and progressed to 15# and then 20#, CGA throughout. Pt demonstrated greatest difficulty when holding 20#. Lastly, had pt place 20# crate on top of U-step to practice using walker as a buggy device, per discussion during last PT session. Pt unable to place crate on walker well due to size, so pt ambulated 115' w/unilateral support on walker and other hand stabilizing crate. Noted significant freezing while approaching mat and turning to sit on mat, but pt exhibited good freezing techniques and no LOB throughout.    GAIT: Gait pattern: step through pattern, decreased arm swing- Right, decreased arm swing- Left, decreased step length- Left, decreased stance time- Right, decreased stride length, decreased ankle dorsiflexion- Right, and shuffling Distance walked: Distances throughout session.  Level of assistance: supervision  Comments: Cued for stride length and posture.  Pt ambulating in and out of session with U-Step Rollator.    PATIENT EDUCATION: Education details: Plan to DC next week, practicing using U-step w/objects on basket at home to continue working on freezing strategies.  Person educated: Patient Education method: Explanation and demonstration  Education comprehension: verbalized and demonstration understanding, needs further instructions.      HOME EXERCISE  PROGRAM:  Seated PWR moves   Access Code: [email or web address] URL: [email or web address] Date: 10/18/2021 Prepared by: Alethia Berthold Anihya Tuma  Exercises - Sit to Stand Without Arm Support  - 1 x daily - 7 x weekly - 3 sets - 10 reps - Proper Sit to Stand Technique  - 1 x daily - 7 x weekly - 3 sets - 10 reps - Seated Shoulder Diagonal Pulls with Resistance  - 1 x daily - 7 x weekly - 3 sets - 10 reps - Lateral Weight Shift with Arm Raise  - 1 x daily - 7 x weekly - 3 sets - 10 reps - Standing Quarter Turn with Counter Support  - 1 x daily - 7 x weekly - 3 sets - 10 reps       GOALS: Goals reviewed with patient? Yes   SHORT TERM GOALS: Target date: 11/07/2021   Pt will be independent with initial HEP  for improved flexibility, strength, balance, transfers, and gait.     Baseline: Goal status: MET    2.  Pt will verbalize/demo understanding of tips to reduce festination with turns and gait.  Baseline:  Goal status: MET    3.  Pt will perform 10 reps of sit <>stands with supervision and BUE support without episodes of BLE  bracing in order to demo improved safety w/ transfers Baseline:  Goal status: MET   4.  Pt will undergo further assessment of miniBEST w/ LTG written. Baseline: 17/28 Goal status: MET   5.  Pt will decr manual TUG to 15 seconds or less for decr fall risk.  Baseline: 17.19 seconds, festination w/ initiation of gait; 11.56s, minor freezing w/turn  Goal status: MET     LONG TERM GOALS: Target date: 12/05/2021   Pt will be independent with final HEP for improved flexibility, strength, balance, transfers, and gait.   Baseline:  Goal status: PROGRESSING    2.  Pt will improve MiniBest to 22/28 for decreased fall risk and improvement with compensatory stepping strategies.   Baseline: 17/28; 21/28 (6/15) Goal status: PROGRESSING   3.  Pt will decr cog TUG to 16 seconds or less for decr fall risk.  Baseline: 18.09 seconds, freezing with turns; 19.16s  w/significant freezing on 6/15 Goal status: PROGRESSING    4.  Pt will improve gait speed with U-Step Walker to at least 2.7 ft/sec in order to demo improved community mobility.    Baseline: 2.38 ft/sec with U-step Walker; 2.94 ft/sec on 11/21/21 Goal status: MET     NEW LONG TERM GOALS FOR EXTENDED POC:  Target date: 01/09/2022 (due to delay in scheduling)   Pt will decr cog TUG to 16 seconds or less for decr fall risk.  Baseline: 18.09 seconds, freezing with turns; 19.16s w/significant freezing on 6/15 Goal status: IN PROGRESS  2.  Pt will improve MiniBest to 23/28 for decreased fall risk and improvement with compensatory stepping strategies.  Baseline: 21/28 on 6/15 Goal status: INITIAL  3.  Pt will be independent with final HEP for improved flexibility, strength, balance, transfers, and gait.   Baseline: Pt not performing at home  Goal status: IN PROGRESS  4.  Pt will verbalize understanding of local PD community resources, including fitness post DC.   Baseline: Pt reports interest in rock steady boxing  Goal status: INITIAL   ASSESSMENT:   CLINICAL IMPRESSION: Emphasis of skilled PT session on practicing freezing strategies with and without U-step when holding heavy objects, as pt is having difficulty with this at home. Noted pt's freezing episodes decreased when holding object without U-step. However, pt demonstrated improved freezing techniques w/use of U-step but could benefit from continued practice. Encouraged pt to use U-step at home to move heavy objects around his home to continue practicing freezing techniques, pt verbalized understanding. Pt in agreement to DC from PT next week and has meeting w/[employer] today. Continue POC.    OBJECTIVE IMPAIRMENTS Abnormal gait, decreased activity tolerance, decreased balance, decreased coordination, decreased knowledge of use of DME, difficulty walking, decreased strength, impaired flexibility, postural dysfunction, and  pain.    ACTIVITY LIMITATIONS community activity.    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 Don Perking notes), cervical dystonia, a fib, R ankle fusion 2021,  arthritis,DVT, A-fib, GERD, sleep apnea   are also affecting patient's functional outcome.  REHAB POTENTIAL: Good   CLINICAL DECISION MAKING: Evolving/moderate complexity   EVALUATION COMPLEXITY: Moderate   PLAN: PT FREQUENCY: 2x/week   PT DURATION: 12 weeks   PLANNED INTERVENTIONS: Therapeutic exercises, Therapeutic activity, Neuromuscular re-education, Balance training, Gait training, Patient/Family education, and DME instructions   PLAN FOR NEXT SESSION:  Plan to DC on 7/6. How did boxing meeting go? 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  Work on A/P stepping, sit<>stand functional strengthening and proper technique, turns. Obstacle navigation, resisted walking, lunges at rebounder, balance on unlevel surfaces, ramps, discuss rock steady boxing, anterior weight shifting     Jill Alexanders Vannah Nadal, PT, DPT 12/09/21 8:44 AM

## 2021-12-11 DIAGNOSIS — M9901 Segmental and somatic dysfunction of cervical region: Secondary | ICD-10-CM | POA: Diagnosis not present

## 2021-12-11 DIAGNOSIS — M47812 Spondylosis without myelopathy or radiculopathy, cervical region: Secondary | ICD-10-CM | POA: Diagnosis not present

## 2021-12-11 DIAGNOSIS — G44209 Tension-type headache, unspecified, not intractable: Secondary | ICD-10-CM | POA: Diagnosis not present

## 2021-12-11 DIAGNOSIS — M542 Cervicalgia: Secondary | ICD-10-CM | POA: Diagnosis not present

## 2021-12-12 ENCOUNTER — Ambulatory Visit: Payer: PPO | Admitting: Physical Therapy

## 2021-12-12 ENCOUNTER — Ambulatory Visit: Payer: PPO | Admitting: Speech Pathology

## 2021-12-12 ENCOUNTER — Encounter: Payer: Self-pay | Admitting: Physical Therapy

## 2021-12-12 DIAGNOSIS — R471 Dysarthria and anarthria: Secondary | ICD-10-CM

## 2021-12-12 DIAGNOSIS — R2681 Unsteadiness on feet: Secondary | ICD-10-CM | POA: Diagnosis not present

## 2021-12-12 DIAGNOSIS — R2689 Other abnormalities of gait and mobility: Secondary | ICD-10-CM

## 2021-12-12 DIAGNOSIS — M6281 Muscle weakness (generalized): Secondary | ICD-10-CM

## 2021-12-12 DIAGNOSIS — R293 Abnormal posture: Secondary | ICD-10-CM

## 2021-12-12 NOTE — Therapy (Signed)
OUTPATIENT SPEECH LANGUAGE PATHOLOGY TREATMENT NOTE (DISCHARGE)   Patient Name: Stanley Wright MRN: [id number] DOB:December 14, 1945, 76 y.o., male Today's Date: 12/12/2021  PCP: Scifres, Dorthy, PA-C REFERRING PROVIDER: Ludwig Clarks, DO   END OF SESSION:   End of Session - 12/12/21 0911     Visit Number 15    Number of Visits 17    Date for SLP Re-Evaluation 12/12/21   +1 week for recertifcation   Authorization Type [employer]    SLP Start Time 0930    SLP Stop Time  1008    SLP Time Calculation (min) 38 min    Activity Tolerance Patient tolerated treatment well               Past Medical History:  Diagnosis Date   Arthritis    BENIGN PROSTATIC HYPERTROPHY, WITH OBSTRUCTION    Cancer ([place])    skin, basal, squamous   COLONIC POLYPS    Diverticulitis    DVT (deep venous thrombosis) ([place]) 2000   Dysrhythmia    Hx Afib- 2017   Early cataract    Factor 5 Leiden mutation, heterozygous ([place])    GERD (gastroesophageal reflux disease)    not on medication   Hearing aid worn    B/L   HIATAL HERNIA    ITP (idiopathic thrombocytopenic purpura) 2003   Long term current use of anticoagulant    Osteoarthritis    right foot   Parkinson's disease ([place]) 02/03/2018   PSORIASIS    Pulmonary embolus ([place]) 2000   Shortness of breath dyspnea    at times - Talking alot   Sleep apnea    Wears glasses    Past Surgical History:  Procedure Laterality Date   ANKLE FUSION Right 08/24/2019   Procedure: RIGHT TALO-NAVICULAR FUSION;  Surgeon: Newt Minion, MD;  Location: [place];  Service: Orthopedics;  Laterality: Right;   CARDIOVERSION N/A 11/22/2015   Procedure: CARDIOVERSION;  Surgeon: Sanda Klein, MD;  Location: [place];  Service: Cardiovascular;  Laterality: N/A;   COLONOSCOPY     HERNIA REPAIR Left    Inguinal- x2 . mesh x1   INSERTION OF MESH N/A 02/25/2016   Procedure: INSERTION OF MESH;  Surgeon: Rolm Bookbinder, MD;  Location: [place];  Service: General;   Laterality: N/A;   LUNG REMOVAL, PARTIAL Right 2004   was fungal and not cancerous   PROSTATE SURGERY     UMBILICAL HERNIA REPAIR N/A 02/25/2016   Procedure: LAPAROSCOPIC UMBILICAL HERNIA REPAIR;  Surgeon: Rolm Bookbinder, MD;  Location: [place];  Service: General;  Laterality: N/A;   Patient Active Problem List   Diagnosis Date Noted   Primary osteoarthritis, right ankle and foot    Lower extremity edema 08/10/2019   Acute DVT (deep venous thrombosis) ([place]) 07/19/2019   History of pulmonary embolism 07/19/2019   Parkinson's disease ([place]) 02/03/2018   Community acquired pneumonia of left lower lobe of lung 12/23/2017   Sepsis ([place]) 12/23/2017   Morbid obesity due to excess calories ([place]) 08/23/2016   OSA on CPAP 08/23/2016   Dyspnea on exertion 14/43/1540   Umbilical hernia 08/67/6195   Persistent atrial fibrillation ([place])    Encounter for therapeutic drug monitoring 08/12/2013   Long term current use of anticoagulant 07/30/2010   COLONIC POLYPS 06/03/2010   Factor V Leiden ([place]) 06/03/2010   GERD 06/03/2010   HIATAL HERNIA 06/03/2010   BENIGN PROSTATIC HYPERTROPHY, WITH OBSTRUCTION 06/03/2010   PSORIASIS 06/03/2010    ONSET DATE: referred 10/01/2021, dx  with Parkinsonism Sept 2019   REFERRING DIAG: G20 (ICD-10-CM) - Primary parkinsonism ([place])   THERAPY DIAG: Dysarthria and anarthria  SUBJECTIVE: "I don't know"  PAIN:  Are you having pain? No Description: n/a  SPEECH THERAPY DISCHARGE SUMMARY  Visits from Start of Care: 15  Current functional level related to goals / functional outcomes: Moderately improved vocal intensity during structured speech tasks and mild improvement in spontaneous, conversational level speech with usual min to mod-A from SLP. Pt endorses comprehension of strategies and ability to implement in order to increase communicative effectiveness, evidenced through teach back with rare min-A.    Remaining deficits: Sub-optimal volume in conversational  speech   Education / Equipment: Speak Out!, dysarthria compensations and strategies, HEP   Patient agrees to discharge. Patient goals were partially met. Patient is being discharged due to maximized rehab potential. .     OBJECTIVE:   TODAY'S TREATMENT:  12-12-21: During opening conversation, pt with low 60 dB, despite cues from SLP to use intentional voice. Discussion on opportunities to practice intentional voice with variety of communication partners. Pt IDs church greetings as meaningful place to practice. Warmed up voice with sustained, loud "ah" and passage reading with pt demonstrating appropriate loudness for tasks with rare min-A from SLP. Reviewed carryover strategies previously targeted: stop-think-speak, raising head to project voice, speaking perceived effort level 6-7/10. Targeted use of strategies in cognitive exercise with pt showing ND use in initial attempt 6/10 trials, evidenced through intelligibility and volume average of 72 dB. Pt with awareness of decrease of intentional speech, able to correct x2 trials, requires SLP mod-A for correction of remaining 2 trials. Following, averages 71 dB in conversation with usual min-A.   12-02-21: Pt entered with low 60s dB conversational volume, which improved mid to upper 60s dB (range from 63 to 68 dB) with frequent SLP cues to be loud, intentional, and "speak out" in opening conversation. SLP educated and instructed patient to raise elevate chin/establish eye contact versus pointing head towards floor which projected voice downwards and impacted listener comprehension. Intermittent cues require to achieve neutral head positioning throughout session. Reportedly completed Speak Out! Program since last ST session, in which SLP re-educated re-starting Speak Out! Program from beginning as pt would continue to benefit due to inconsistent carryover. Utilized warm up exercises and cognitive exercises to prepare speech for less structured conversation,  with overall average of 68 dB in conversation given usual prompting and SLP modeling.   11-28-21: Pt reports some compliance with HEP, not doing daily. Enters room with sub-optimal volume, tells ST he is "not being intentional." SLP leads pt through Speak Out! Lesson 21, without modeling so pt can demonstrate how he's been completing exercises at home. Averages following dB with occasional min-A: Loud ah: 93 dB (~10 seconds) Counting: 87 dB Provide education on using breath support, reduced rate, and increased volume for extended utterance exercises. Usual mod-A required to reduce rate, rare min-A for intensity.  Reading (passage): 73 dB with increased rate; 65 dB when focusing on reducing rate  Cognitive Ex: 67 dB usual cues for rate/intensity.  Education on pausing, formulating thought, then using intentional speech. Pt able to use this strategy with mod-I following initial period of instruction, resulting in increased clarity of speech through loudness and pacing.   11-25-21: Pt entered with conversational volume averaging upper 60s dB, in which pt identified reduced volume when prompted. Continues to partially complete Speak Out! Lessons due to reported time constraints. SLP leads pt through Lesson 20  as pt did not fully complete this lesson at home. Usual min to mod-A to maintain appropriate rate of speech and optimize breath support to increase intelligibility. Occasional min-A for maintaining and increasing intensity during cognitive exercises. Averaged the following dB through structured practice this date: Loud ah: 89 dB (~10 seconds) Counting: 81 dB Reading (passage): 73 dB Cognitive Ex: 72 dB (usual cues to slow rate and occasional cues to increase volume)   PATIENT EDUCATION: Education details: see above Person educated: Patient Education method: Explanation, Demonstration, and Handouts Education comprehension: verbalized understanding, returned demonstration, and needs further  education     HOME EXERCISE PROGRAM: Speak Out!    GOALS: Goals reviewed with patient? Yes   SHORT TERM GOALS: Target date: 11/07/2021   Pt will complete dysarthria HEP with mod-I over 2 sessions Baseline: 10-31-2021, 11-05-21 Goal status: met   2.  Pt will average 85 dB in x5 loud "ah" production with rare min-A over 2 sessions Baseline: 10-31-2021, 11-05-21 Goal status: met   3.  Pt will exceed 72 dB in 80% of oral reading trials with usual mod-A over 2 sessions Baseline: 11-05-21, 11-07-21 Goal status: met   4.  Pt will meet dB targets for Speak Out! cognitive exercise (72+ dB) in 80% of trials with usual mod-A over 2 sessions  Baseline: 11-05-21, 11-05-21 Goal status: partially met     LONG TERM GOALS: Target date: 12-12-21 (+1 week for recert)   Pt will report improved voice related QOL via V-QROL by 10 points (calculated score) by d/c.  Baseline: 67.5 calculated score Goal status: not met   2.  Pt will average 70+ dB in 10 minute conversational sample with rare min-A over 2 sessions.  Baseline: 12-02-21, 12-12-21 Goal status: partially met   3.  Pt will report completion of dysarthria HEP, with perceived benefit, at least 1x per day (recommend BID) over 1 week period.  Baseline: 12-12-21 Goal status: met   4.  Pt will average 72+ dB in oral reading trials, paragraph level with rare min-A over 2 sessions Baseline: 11-25-21, 12-12-21 Goal status: met   5.  Pt will meet dB targets for Speak Out! cognitive exercise (72+ dB) in 80% of trials with rare min-A over 2 sessions  Baseline: 12-12-21 Goal status: partially met   ASSESSMENT:   CLINICAL IMPRESSION: Patient is a 75 y.o. M who was seen today for dysarthria 2/2 Parkinsonism. Ongoing education and structured practice using intent to elevate pt's communicative effectiveness. ST conducted ongoing training with Speak Out! Program with pt currently requiring occasional min-A for meeting dB targets, usual mod-A for pacing to  prevent rushes of speech, and frequent cueing and prompting for carryover to conversational speech. Increasing awareness of decreased loudness exhibited throughout sessions, although limited independent self-corrections exhibited. Recommend continued skilled ST to maximize communication effectiveness.   OBJECTIVE IMPAIRMENTS  Objective impairments include dysarthria. These impairments are limiting patient from effectively communicating at home and in community.Factors affecting potential to achieve goals and functional outcome are medical prognosis. Patient will benefit from skilled SLP services to address above impairments and improve overall function.   REHAB POTENTIAL: Good   PLAN: SLP FREQUENCY: 2x/week   SLP DURATION: 10 weeks (+2 weeks for recertification)   PLANNED INTERVENTIONS: Cueing hierachy, Internal/external aids, Functional tasks, SLP instruction and feedback, Compensatory strategies, and Patient/family education  Su Monks, CCC-SLP 12/12/2021, 10:01 AM

## 2021-12-12 NOTE — Therapy (Signed)
OUTPATIENT PHYSICAL THERAPY TREATMENT NOTE   Patient Name: Stanley Wright MRN: [id number] DOB:[birth year], 76 y.o., male Today's Date: 12/12/2021  PCP: Maude Leriche, PA-C  REFERRING PROVIDER: Ludwig Clarks, DO   END OF SESSION:   PT End of Session - 12/12/21 0849     Visit Number 16    Number of Visits 19    Date for PT Re-Evaluation 01/08/22   due to delay in scheduling   Authorization Type [employer]    PT Start Time 0847    PT Stop Time 0929    PT Time Calculation (min) 42 min    Equipment Utilized During Treatment Gait belt    Activity Tolerance Patient tolerated treatment well    Behavior During Therapy WFL for tasks assessed/performed                    Past Medical History:  Diagnosis Date   Arthritis    BENIGN PROSTATIC HYPERTROPHY, WITH OBSTRUCTION    Cancer ([place])    skin, basal, squamous   COLONIC POLYPS    Diverticulitis    DVT (deep venous thrombosis) ([place]) 2000   Dysrhythmia    Hx Afib- 2017   Early cataract    Factor 5 Leiden mutation, heterozygous ([place])    GERD (gastroesophageal reflux disease)    not on medication   Hearing aid worn    B/L   HIATAL HERNIA    ITP (idiopathic thrombocytopenic purpura) 2003   Long term current use of anticoagulant    Osteoarthritis    right foot   Parkinson's disease ([place]) 02/03/2018   PSORIASIS    Pulmonary embolus ([place]) 2000   Shortness of breath dyspnea    at times - Talking alot   Sleep apnea    Wears glasses    Past Surgical History:  Procedure Laterality Date   ANKLE FUSION Right 08/24/2019   Procedure: RIGHT TALO-NAVICULAR FUSION;  Surgeon: Newt Minion, MD;  Location: [place];  Service: Orthopedics;  Laterality: Right;   CARDIOVERSION N/A 11/22/2015   Procedure: CARDIOVERSION;  Surgeon: Sanda Klein, MD;  Location: [place];  Service: Cardiovascular;  Laterality: N/A;   COLONOSCOPY     HERNIA REPAIR Left    Inguinal- x2 . mesh x1   INSERTION OF MESH N/A 02/25/2016    Procedure: INSERTION OF MESH;  Surgeon: Rolm Bookbinder, MD;  Location: [place];  Service: General;  Laterality: N/A;   LUNG REMOVAL, PARTIAL Right 2004   was fungal and not cancerous   PROSTATE SURGERY     UMBILICAL HERNIA REPAIR N/A 02/25/2016   Procedure: LAPAROSCOPIC UMBILICAL HERNIA REPAIR;  Surgeon: Rolm Bookbinder, MD;  Location: [place];  Service: General;  Laterality: N/A;   Patient Active Problem List   Diagnosis Date Noted   Primary osteoarthritis, right ankle and foot    Lower extremity edema 08/10/2019   Acute DVT (deep venous thrombosis) ([place]) 07/19/2019   History of pulmonary embolism 07/19/2019   Parkinson's disease ([place]) 02/03/2018   Community acquired pneumonia of left lower lobe of lung 12/23/2017   Sepsis ([place]) 12/23/2017   Morbid obesity due to excess calories ([place]) 08/23/2016   OSA on CPAP 08/23/2016   Dyspnea on exertion 99/35/7017   Umbilical hernia 79/39/0300   Persistent atrial fibrillation ([place])    Encounter for therapeutic drug monitoring 08/12/2013   Long term current use of anticoagulant 07/30/2010   COLONIC POLYPS 06/03/2010   Factor V Leiden ([place]) 06/03/2010   GERD 06/03/2010  HIATAL HERNIA 06/03/2010   BENIGN PROSTATIC HYPERTROPHY, WITH OBSTRUCTION 06/03/2010   PSORIASIS 06/03/2010    REFERRING DIAG: G20 (ICD-10-CM) - Primary parkinsonism ([place])    THERAPY DIAG:  Unsteadiness on feet  Other abnormalities of gait and mobility  Abnormal posture  Muscle weakness (generalized)  PERTINENT HISTORY:  Parkinsonism, 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    PRECAUTIONS: Fall  SUBJECTIVE: Still having difficulty with turning. [place] boxing - went on Monday and Wednesday, "is sore in places I didn't know I could be sore in",  but overall it went well.    PAIN:  Are you having pain? No     TODAY'S TREATMENT:  Sit to stands performed throughout session with reminder cues for incr forward lean to decr retropulsion against chair. Cues to take his time when standing.     NMR  Performed 115' of gait with no AD with cues for posture/looking ahead and bilat arm swing.  Worked on wide U-turns over 30' x6 reps without AD, pt leading with outside foot and counting his steps when turning to try to take as few as possible, pt did well with this and had no episodes of freezing/festination and able to maintain good step length.  Worked on clock turns with weight shifting and leading big with outside foot for changing direction (cues to step to 9:00, 3:00, 12:00) - worked on 180 degree turns and then 360 degrees x10 reps going to each side, pt more difficulty with initiating turn with RLE.  Then performed grabbing cone from table - performing weight shifting/clock turn and ambulating a small distance to place cone on mat table, performed x8 reps each direction. Cues throughout for larger amplitude movement when beginning to initiate turn.  Posterior/lateral stepping strategy leading with RLE to dot as visual target and reaching to place cone on table 2 sets of 10 reps to the R - pt needing cues for larger amplitude of movement with RLE and taking one big step to get back to midline instead of 2-3 steps (pt doing better the 2nd rep), x10 reps to the L. Pt with more difficulty to the R.  TUG shuttle with 2 chairs approx. 15' away from each other working on small distance gait and turning; x6 reps, pt preferred method of when he is approaching a chair/mat to sit down to count his steps vs. Trying to perform a weight shifting/marching turn. Performed small distance ambulation when holding a crate with no weight, PT cueing pt to turn either R/L (180 degrees) on command using weight shifting/clock turn and cued for incr amplitude of  movement when initiating a step with outside foot, performed approx. 10 reps each side, pt with more difficulty initiating turn to the R and had a couple episodes of freezing/more shuffled steps with PT needing to cue pt to stop and reset.  When ambulating to go to turn to sit to mat table throughout session, cued to count steps to decr freezing/festination episodes (this worked better for pt instead of marching turns).  Pt needing cues throughout session to slow down, STOP and reset when he begins to have a freezing episode and re-set with tall posture and rock before initiating gait/turn. Discussed making sure that he takes the time to do this at home as  it can lead to potential falls.    GAIT: Gait pattern: step through pattern, decreased arm swing- Right, decreased arm swing- Left, decreased step length- Left, decreased stance time- Right, decreased stride length, decreased ankle dorsiflexion- Right, and shuffling Distance walked: Distances throughout session.  Level of assistance: supervision  Comments: Cued for stride length and posture.  Pt ambulating in and out of session with U-Step Rollator.     PATIENT EDUCATION: Education details: See NMR section above - practice with turns. Continued to edcuate on using AD - either U-Step rollator or walking stick as these help improve pt's balance and decr pt's freezing/festination episodes with turning. Pt does not wish to use AD despite education. Discussed PD screens vs. Evals, pt in agreement for PD evals in 6 months for PT/ST (does not want to do OT at this time). Person educated: Patient Education method: Explanation and demonstration  Education comprehension: verbalized and demonstration understanding, needs further instructions.      HOME EXERCISE PROGRAM:  Seated PWR moves   Access Code: [email or web address] URL: [email or web address] Date: 10/18/2021 Prepared by: Mickie Bail Plaster  Exercises - Sit to Stand Without Arm Support  -  1 x daily - 7 x weekly - 3 sets - 10 reps - Proper Sit to Stand Technique  - 1 x daily - 7 x weekly - 3 sets - 10 reps - Seated Shoulder Diagonal Pulls with Resistance  - 1 x daily - 7 x weekly - 3 sets - 10 reps - Lateral Weight Shift with Arm Raise  - 1 x daily - 7 x weekly - 3 sets - 10 reps - Standing Quarter Turn with Counter Support  - 1 x daily - 7 x weekly - 3 sets - 10 reps       GOALS: Goals reviewed with patient? Yes   SHORT TERM GOALS: Target date: 11/07/2021   Pt will be independent with initial HEP for improved flexibility, strength, balance, transfers, and gait.     Baseline: Goal status: MET    2.  Pt will verbalize/demo understanding of tips to reduce festination with turns and gait.  Baseline:  Goal status: MET    3.  Pt will perform 10 reps of sit <>stands with supervision and BUE support without episodes of BLE  bracing in order to demo improved safety w/ transfers Baseline:  Goal status: MET   4.  Pt will undergo further assessment of miniBEST w/ LTG written. Baseline: 17/28 Goal status: MET   5.  Pt will decr manual TUG to 15 seconds or less for decr fall risk.  Baseline: 17.19 seconds, festination w/ initiation of gait; 11.56s, minor freezing w/turn  Goal status: MET     LONG TERM GOALS: Target date: 12/05/2021   Pt will be independent with final HEP for improved flexibility, strength, balance, transfers, and gait.   Baseline:  Goal status: PROGRESSING    2.  Pt will improve MiniBest to 22/28 for decreased fall risk and improvement with compensatory stepping strategies.   Baseline: 17/28; 21/28 (6/15) Goal status: PROGRESSING   3.  Pt will decr cog TUG to 16 seconds or less for decr fall risk.  Baseline: 18.09 seconds, freezing with turns; 19.16s w/significant freezing on 6/15 Goal status: PROGRESSING    4.  Pt will improve gait speed with U-Step Walker to at least 2.7 ft/sec in order to demo improved community mobility.    Baseline: 2.38  ft/sec with Henrene Dodge; 2.94 ft/sec on 11/21/21 Goal status: MET  NEW LONG TERM GOALS FOR EXTENDED POC:  Target date: 01/09/2022 (due to delay in scheduling)   Pt will decr cog TUG to 16 seconds or less for decr fall risk.  Baseline: 18.09 seconds, freezing with turns; 19.16s w/significant freezing on 6/15 Goal status: IN PROGRESS  2.  Pt will improve MiniBest to 23/28 for decreased fall risk and improvement with compensatory stepping strategies.  Baseline: 21/28 on 6/15 Goal status: INITIAL  3.  Pt will be independent with final HEP for improved flexibility, strength, balance, transfers, and gait.   Baseline: Pt not performing at home  Goal status: IN PROGRESS  4.  Pt will verbalize understanding of local PD community resources, including fitness post DC.   Baseline: Pt reports interest in rock steady boxing  Goal status: INITIAL   ASSESSMENT:   CLINICAL IMPRESSION: Today's skilled session focused on turning techniques without AD - working on counting his steps when turning, wide based U- turns, and weight shifting/clock turns to help with changing direction and then initiating a big step. Pt challenged when having to perform weight shifting turns when leading to go to the R, needs cues for larger amplitude movements as this is when pt tends to have his freezing episodes. Needs reminder cues to stop and reset before weight shifting to continue. Pt with festination episodes when going to turn to sit down on mat table, but did better when counting his steps and taking fewer steps before sitting down. Continued to educate that use of walking/trekking pole and U-Step Rollator is going to be the safest option to help with turning and decr fall risk, but pt still does not want to use an AD. Will only use his U-Step Rollator when coming into therapy. Will continue to progress towards LTGs.     OBJECTIVE IMPAIRMENTS Abnormal gait, decreased activity tolerance, decreased balance, decreased  coordination, decreased knowledge of use of DME, difficulty walking, decreased strength, impaired flexibility, postural dysfunction, and pain.    ACTIVITY LIMITATIONS community activity.    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      REHAB POTENTIAL: Good   CLINICAL DECISION MAKING: Evolving/moderate complexity   EVALUATION COMPLEXITY: Moderate   PLAN: PT FREQUENCY: 2x/week   PT DURATION: 12 weeks   PLANNED INTERVENTIONS: Therapeutic exercises, Therapeutic activity, Neuromuscular re-education, Balance training, Gait training, Patient/Family education, and DME instructions   PLAN FOR NEXT SESSION:  D/C next session, check LTGs. Make sure pt is scheduled in 6 months for PD evals.   Continue to work on freezing strategies with turns.  Work on A/P stepping, sit<>stand functional strengthening and proper technique, turns. Obstacle navigation, resisted walking, lunges at rebounder, balance on unlevel surfaces, ramps, discuss rock steady boxing, anterior weight shifting    Janann August, PT, DPT 12/12/21 9:39 AM

## 2021-12-16 ENCOUNTER — Ambulatory Visit: Payer: Medicare HMO | Attending: Neurology | Admitting: Physical Therapy

## 2021-12-16 ENCOUNTER — Encounter: Payer: Self-pay | Admitting: Physical Therapy

## 2021-12-16 DIAGNOSIS — R293 Abnormal posture: Secondary | ICD-10-CM

## 2021-12-16 DIAGNOSIS — R2681 Unsteadiness on feet: Secondary | ICD-10-CM

## 2021-12-16 DIAGNOSIS — R2689 Other abnormalities of gait and mobility: Secondary | ICD-10-CM | POA: Diagnosis present

## 2021-12-16 DIAGNOSIS — M6281 Muscle weakness (generalized): Secondary | ICD-10-CM

## 2021-12-16 NOTE — Therapy (Signed)
OUTPATIENT PHYSICAL THERAPY TREATMENT NOTE/DISCHARGE SUMMARY   Patient Name: Stanley Wright MRN: [id number] DOB:12-16-1945, 76 y.o., male Today's Date: 12/16/2021  PCP: Maude Leriche, PA-C  REFERRING PROVIDER: Ludwig Clarks, DO   END OF SESSION:   PT End of Session - 12/16/21 0759     Visit Number 17    Number of Visits 19    Date for PT Re-Evaluation 01/08/22   due to delay in scheduling   Authorization Type [employer]    PT Start Time 0758    PT Stop Time 0840    PT Time Calculation (min) 42 min    Equipment Utilized During Treatment Gait belt    Activity Tolerance Patient tolerated treatment well    Behavior During Therapy WFL for tasks assessed/performed                     Past Medical History:  Diagnosis Date   Arthritis    BENIGN PROSTATIC HYPERTROPHY, WITH OBSTRUCTION    Cancer ([place])    skin, basal, squamous   COLONIC POLYPS    Diverticulitis    DVT (deep venous thrombosis) ([place]) 2000   Dysrhythmia    Hx Afib- 2017   Early cataract    Factor 5 Leiden mutation, heterozygous ([place])    GERD (gastroesophageal reflux disease)    not on medication   Hearing aid worn    B/L   HIATAL HERNIA    ITP (idiopathic thrombocytopenic purpura) 2003   Long term current use of anticoagulant    Osteoarthritis    right foot   Parkinson's disease ([place]) 02/03/2018   PSORIASIS    Pulmonary embolus ([place]) 2000   Shortness of breath dyspnea    at times - Talking alot   Sleep apnea    Wears glasses    Past Surgical History:  Procedure Laterality Date   ANKLE FUSION Right 08/24/2019   Procedure: RIGHT TALO-NAVICULAR FUSION;  Surgeon: Newt Minion, MD;  Location: [facility];  Service: Orthopedics;  Laterality: Right;   CARDIOVERSION N/A 11/22/2015   Procedure: CARDIOVERSION;  Surgeon: Sanda Klein, MD;  Location: [facility];  Service: Cardiovascular;  Laterality: N/A;   COLONOSCOPY     HERNIA REPAIR Left    Inguinal- x2 . mesh x1   INSERTION OF MESH  N/A 02/25/2016   Procedure: INSERTION OF MESH;  Surgeon: Rolm Bookbinder, MD;  Location: [facility];  Service: General;  Laterality: N/A;   LUNG REMOVAL, PARTIAL Right 2004   was fungal and not cancerous   PROSTATE SURGERY     UMBILICAL HERNIA REPAIR N/A 02/25/2016   Procedure: LAPAROSCOPIC UMBILICAL HERNIA REPAIR;  Surgeon: Rolm Bookbinder, MD;  Location: [facility];  Service: General;  Laterality: N/A;   Patient Active Problem List   Diagnosis Date Noted   Primary osteoarthritis, right ankle and foot    Lower extremity edema 08/10/2019   Acute DVT (deep venous thrombosis) ([place]) 07/19/2019   History of pulmonary embolism 07/19/2019   Parkinson's disease ([place]) 02/03/2018   Community acquired pneumonia of left lower lobe of lung 12/23/2017   Sepsis ([place]) 12/23/2017   Morbid obesity due to excess calories ([place]) 08/23/2016   OSA on CPAP 08/23/2016   Dyspnea on exertion 00/86/7619   Umbilical hernia 50/93/2671   Persistent atrial fibrillation ([place])    Encounter for therapeutic drug monitoring 08/12/2013   Long term current use of anticoagulant 07/30/2010   COLONIC POLYPS 06/03/2010   Factor V Leiden ([place]) 06/03/2010   GERD  06/03/2010   HIATAL HERNIA 06/03/2010   BENIGN PROSTATIC HYPERTROPHY, WITH OBSTRUCTION 06/03/2010   PSORIASIS 06/03/2010    REFERRING DIAG: G20 (ICD-10-CM) - Primary parkinsonism ([place])    THERAPY DIAG:  Unsteadiness on feet  Other abnormalities of gait and mobility  Muscle weakness (generalized)  Abnormal posture  PERTINENT HISTORY:  Parkinsonism, 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    PRECAUTIONS: Fall  SUBJECTIVE: No falls, feels like turns are still not perfect at home when he does that with no AD. PT still recommending U-Step Rollator.     PAIN:  Are you having pain? No     TODAY'S TREATMENT:  Therapeutic Activity:  Reviewed weight shifting turns when changing direction: discussed that when pt needs to change direction to stop and re-set with tall posture and initiate with rocking to help with weight shifting to do a big step, pt did improve with this when thinking about weight shifting first  to initiate a turn.  Reviewed results of LTGs and areas of HEP to keep focusing on and PT still recommending U-Step Rollator at all times (pt still using no AD) to help with balance due to fall risk and to assist pt when turning as he does have more freezing episodes with no AD when dual tasking or when having to change directions if he does not think about weight shifting first. Discussed plan for PD evals in 6 months and if pt notices a change in his balance before then, can always ask for a new referral to return.     [facility] PT Assessment - 12/16/21 0802       Mini-BESTest   Sit To Stand Normal: Comes to stand without use of hands and stabilizes independently.    Rise to Toes Moderate: Heels up, but not full range (smaller than when holding hands), OR noticeable instability for 3 s.    Stand on one leg (left) Moderate: < 20 s    Stand on one leg (right) Moderate: < 20 s   <1 sec   Stand on one leg - lowest score 1    Compensatory Stepping Correction - Forward Moderate: More than one step is required to recover equilibrium   2 small steps   Compensatory Stepping Correction - Backward Moderate: More than one step is required to recover equilibrium   3 small steps   Compensatory Stepping Correction - Left Lateral Normal: Recovers independently with 1 step (crossover or lateral OK)    Compensatory Stepping Correction - Right Lateral Severe:  Falls, or cannot step   needing assist to prevent from falling   Stepping Corredtion Lateral - lowest score 0    Stance - Feet together, eyes open, firm surface  Normal: 30s    Stance - Feet  together, eyes closed, foam surface  Normal: 30s    Incline - Eyes Closed Normal: Stands independently 30s and aligns with gravity    Change in Gait Speed Normal: Significantly changes walkling speed without imbalance    Walk with head turns - Horizontal Normal: performs head turns with no change in gait speed and good balance    Walk with pivot turns Moderate:Turns with feet close SLOW (>4 steps) with good balance.    Step over obstacles Normal: Able to step over box with minimal change  of gait speed and with good balance.    Timed UP & GO with Dual Task Moderate: Dual Task affects either counting OR walking (>10%) when compared to the TUG without Dual Task.    Mini-BEST total score 20      Timed Up and Go Test   Normal TUG (seconds) 9.34    Cognitive TUG (seconds) 17.19   with first attempt with significant freezing, 11.75 2nd attempt with only minimal freezing when going to turn back to chair            Access Code: [email or web address] URL: [email or web address] Date: 12/16/2021 Prepared by: Janann August  Reviewed HEP for functional strength/balance:   Exercises - Sit to Stand Without Arm Support  - 1 x daily - 7 x weekly - 3 sets - 10 reps - Proper Sit to Stand Technique  - 1 x daily - 7 x weekly - 3 sets - 10 reps - Seated Shoulder Diagonal Pulls with Resistance  - 1 x daily - 7 x weekly - 3 sets - 10 reps - Lateral Weight Shift with Arm Raise  - 1 x daily - 7 x weekly - 3 sets - 10 reps - Standing Quarter Turn with Counter Support  - 1 x daily - 7 x weekly - 3 sets - 10 reps - Alternating Step Backward with Support  - 1-2 x daily - 5 x weekly - 1 sets - 10 reps       GAIT: Gait pattern: step through pattern, decreased arm swing- Right, decreased arm swing- Left, decreased step length- Left, decreased stance time- Right, decreased stride length, decreased ankle dorsiflexion- Right, and shuffling Distance walked: Distances throughout session.  Level of assistance:  supervision  Comments: Cued for stride length and posture.  Pt ambulating in and out of session with U-Step Rollator. Distances during session with no AD with supervision for straight distances and min guard at times when turning during to freezing.      PHYSICAL THERAPY DISCHARGE SUMMARY  Visits from Start of Care: 17  Current functional level related to goals / functional outcomes: See LTGs/Clinical Assessment.   Remaining deficits: Postural abnormalities, bradykinesia, gait abnormalities, impaired timing/coordination of gait, impaired functional strength, impaired balance.   Education / Equipment: HEP, fall education   Patient agrees to discharge. Patient goals were partially met. Patient is being discharged due to being pleased with the current functional level.   PATIENT EDUCATION: Education details: See therapeutic activity section above, reviewed HEP.  Person educated: Patient Education method: Explanation and demonstration, Handout Education comprehension: verbalized and demonstration understanding, needs further instructions.      HOME EXERCISE PROGRAM:  Seated PWR moves   Access Code: [email or web address] URL: [email or web address] Date: 12/16/2021 Prepared by: Janann August  Exercises - Sit to Stand Without Arm Support  - 1 x daily - 7 x weekly - 3 sets - 10 reps - Proper Sit to Stand Technique  - 1 x daily - 7 x weekly - 3 sets - 10 reps - Seated Shoulder Diagonal Pulls with Resistance  - 1 x daily - 7 x weekly - 3 sets - 10 reps - Lateral Weight Shift with Arm Raise  - 1 x daily - 7 x weekly - 3 sets - 10 reps - Standing Quarter Turn with Counter Support  - 1 x daily - 7 x weekly - 3 sets - 10 reps - Alternating Step Backward with Support  - 1-2 x daily - 5 x weekly - 1 sets -  10 reps       GOALS: Goals reviewed with patient? Yes   SHORT TERM GOALS: Target date: 11/07/2021   Pt will be independent with initial HEP for improved flexibility,  strength, balance, transfers, and gait.     Baseline: Goal status: MET    2.  Pt will verbalize/demo understanding of tips to reduce festination with turns and gait.  Baseline:  Goal status: MET    3.  Pt will perform 10 reps of sit <>stands with supervision and BUE support without episodes of BLE  bracing in order to demo improved safety w/ transfers Baseline:  Goal status: MET   4.  Pt will undergo further assessment of miniBEST w/ LTG written. Baseline: 17/28 Goal status: MET   5.  Pt will decr manual TUG to 15 seconds or less for decr fall risk.  Baseline: 17.19 seconds, festination w/ initiation of gait; 11.56s, minor freezing w/turn  Goal status: MET     LONG TERM GOALS: Target date: 12/05/2021   Pt will be independent with final HEP for improved flexibility, strength, balance, transfers, and gait.   Baseline:  Goal status: PROGRESSING    2.  Pt will improve MiniBest to 22/28 for decreased fall risk and improvement with compensatory stepping strategies.   Baseline: 17/28; 21/28 (6/15);  Goal status: PROGRESSING   3.  Pt will decr cog TUG to 16 seconds or less for decr fall risk.  Baseline: 18.09 seconds, freezing with turns; 19.16s w/significant freezing on 6/15;  Goal status: PROGRESSING    4.  Pt will improve gait speed with U-Step Walker to at least 2.7 ft/sec in order to demo improved community mobility.    Baseline: 2.38 ft/sec with U-step Walker; 2.94 ft/sec on 11/21/21 Goal status: MET     NEW LONG TERM GOALS FOR EXTENDED POC:  Target date: 01/09/2022 (due to delay in scheduling)   Pt will decr cog TUG to 16 seconds or less for decr fall risk.  Baseline: 18.09 seconds, freezing with turns; 19.16s w/significant freezing on 6/15; 11.75 seconds with 2nd attempt  Goal status: MET  2.  Pt will improve MiniBest to 23/28 for decreased fall risk and improvement with compensatory stepping strategies.  Baseline: 21/28 on 6/15; 20/28 on 12/16/21 Goal status: NOT  MET  3.  Pt will be independent with final HEP for improved flexibility, strength, balance, transfers, and gait.   Baseline: Pt not performing consistently at home.  Goal status: PARTIALLY MET  4.  Pt will verbalize understanding of local PD community resources, including fitness post DC.   Baseline: Pt has started rock steady boxing  Goal status: MET   ASSESSMENT:   CLINICAL IMPRESSION: Today's skilled session focused on assessing pt's LTGs. Pt has met 2 out of 4 LTGs, partially met LTG #3, and did not meet LTG #2. Pt scored a 20/28 on the miniBEST (previously was 21/28), indicating continued fall risk. Pt with difficulty with reactive balance for stepping strategies in the R lateral and posterior direction. Pt improved his time on cog TUG with 2nd attempt - able to perform in 11.75 seconds with pt having a mild episode of freezing when turning to sit back to chair. During first attempt, pt with significant freezing when turning and pt needing min guard. Reviewed turning strategies with changing direction without AD, cued to stop and get tall, rock and then initiate taking a big step (like facing the direction of 3 or 9 on a clock). Pt does well with cues,  but without cues pt does still have some episodes of freezing. Continued to educate pt on use of U-Step Rollator at all times due to incr fall risk, impaired stepping strategies for balance recovery, and freezing with turns. Pt continues to not use an AD the majority of the time despite education from therapists. Reviewed HEP with pt, as he has not been consistently performing at home and purpose of each exercise. Will plan to D/C at this time due to pt feeling ready for D/C. Plan to schedule for PD evals in 6 months for PT and speech. Pt in agreement with plan.    OBJECTIVE IMPAIRMENTS Abnormal gait, decreased activity tolerance, decreased balance, decreased coordination, decreased knowledge of use of DME, difficulty walking, decreased  strength, impaired flexibility, postural dysfunction, and pain.    ACTIVITY LIMITATIONS community activity.    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      REHAB POTENTIAL: Good   CLINICAL DECISION MAKING: Evolving/moderate complexity   EVALUATION COMPLEXITY: Moderate   PLAN: PT FREQUENCY: 2x/week   PT DURATION: 12 weeks   PLANNED INTERVENTIONS: Therapeutic exercises, Therapeutic activity, Neuromuscular re-education, Balance training, Gait training, Patient/Family education, and DME instructions   PLAN FOR NEXT SESSION:  D/C     Janann August, PT, DPT 12/16/21 8:45 AM

## 2021-12-19 ENCOUNTER — Ambulatory Visit: Payer: PPO | Admitting: Physical Therapy

## 2021-12-23 ENCOUNTER — Ambulatory Visit: Payer: PPO | Admitting: Physical Therapy

## 2021-12-23 ENCOUNTER — Telehealth: Payer: Self-pay | Admitting: Neurology
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
## 2021-12-23 NOTE — Telephone Encounter (Signed)
Patients daughter called and is very upset.  She states she does not know what to do and would like some advice.  She states her dad is going thru some things and being scammed and he will not listen to anyone or let them help him.  She thinks this is due to his parkinsons and dementia coming thru worse?  Please call her back [phone].

## 2021-12-26 ENCOUNTER — Ambulatory Visit: Payer: PPO | Admitting: Physical Therapy

## 2021-12-27 ENCOUNTER — Ambulatory Visit (INDEPENDENT_AMBULATORY_CARE_PROVIDER_SITE_OTHER): Payer: Medicare HMO

## 2021-12-27 DIAGNOSIS — Z5181 Encounter for therapeutic drug level monitoring: Secondary | ICD-10-CM | POA: Diagnosis not present

## 2021-12-27 DIAGNOSIS — I4819 Other persistent atrial fibrillation: Secondary | ICD-10-CM

## 2021-12-27 LAB — POCT INR: INR: 1.5 — AB (ref 2.0–3.0)

## 2021-12-27 NOTE — Patient Instructions (Signed)
Description   Take 2 tablets today and then continue taking 1 tablet daily except for 1.5 tablets on Mondays. Recheck INR in 3 weeks.  [facility] [phone] or [phone]

## 2021-12-30 ENCOUNTER — Ambulatory Visit: Payer: PPO | Admitting: Physical Therapy

## 2022-01-02 ENCOUNTER — Ambulatory Visit: Payer: PPO | Admitting: Physical Therapy

## 2022-01-06 ENCOUNTER — Ambulatory Visit: Payer: PPO | Admitting: Physical Therapy

## 2022-01-09 ENCOUNTER — Ambulatory Visit: Payer: PPO | Admitting: Physical Therapy

## 2022-01-17 ENCOUNTER — Ambulatory Visit: Payer: Self-pay

## 2022-01-17 ENCOUNTER — Ambulatory Visit: Payer: Medicare HMO | Admitting: Family

## 2022-01-17 ENCOUNTER — Encounter: Payer: Self-pay | Admitting: Family

## 2022-01-17 ENCOUNTER — Ambulatory Visit (INDEPENDENT_AMBULATORY_CARE_PROVIDER_SITE_OTHER): Payer: Medicare HMO

## 2022-01-17 DIAGNOSIS — M25561 Pain in right knee: Secondary | ICD-10-CM

## 2022-01-17 DIAGNOSIS — M79671 Pain in right foot: Secondary | ICD-10-CM

## 2022-01-20 ENCOUNTER — Other Ambulatory Visit: Payer: Self-pay | Admitting: Cardiology

## 2022-01-20 DIAGNOSIS — I5032 Chronic diastolic (congestive) heart failure: Secondary | ICD-10-CM

## 2022-01-21 ENCOUNTER — Ambulatory Visit: Payer: Medicare HMO | Admitting: *Deleted

## 2022-01-21 DIAGNOSIS — I4819 Other persistent atrial fibrillation: Secondary | ICD-10-CM | POA: Diagnosis not present

## 2022-01-21 DIAGNOSIS — D6851 Activated protein C resistance: Secondary | ICD-10-CM | POA: Diagnosis not present

## 2022-01-21 DIAGNOSIS — Z5181 Encounter for therapeutic drug level monitoring: Secondary | ICD-10-CM | POA: Diagnosis not present

## 2022-01-21 LAB — POCT INR: INR: 3.5 — AB (ref 2.0–3.0)

## 2022-01-21 NOTE — Patient Instructions (Signed)
Description   Do not take any Warfarin today then continue taking 1 tablet daily except for 1.5 tablets on Mondays. Recheck INR in 3 weeks.  [facility] [phone] or [phone]
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
## 2022-02-04 ENCOUNTER — Other Ambulatory Visit: Payer: Self-pay

## 2022-02-04 DIAGNOSIS — G20 Parkinson's disease: Secondary | ICD-10-CM

## 2022-02-04 DIAGNOSIS — G232 Striatonigral degeneration: Secondary | ICD-10-CM

## 2022-02-04 DIAGNOSIS — G2581 Restless legs syndrome: Secondary | ICD-10-CM

## 2022-02-04 MED ORDER — PRAMIPEXOLE DIHYDROCHLORIDE 0.125 MG PO TABS: 0.1250 mg | ORAL_TABLET | Freq: Every day | ORAL | 0 refills | Status: DC

## 2022-02-04 MED ORDER — CLONAZEPAM 0.5 MG PO TABS: ORAL_TABLET | ORAL | 1 refills | Status: DC

## 2022-02-05 ENCOUNTER — Telehealth: Payer: Self-pay | Admitting: Cardiology

## 2022-02-05 ENCOUNTER — Telehealth: Payer: Self-pay | Admitting: Neurology

## 2022-02-05 DIAGNOSIS — G20 Parkinson's disease: Secondary | ICD-10-CM

## 2022-02-05 DIAGNOSIS — G232 Striatonigral degeneration: Secondary | ICD-10-CM

## 2022-02-05 DIAGNOSIS — I5032 Chronic diastolic (congestive) heart failure: Secondary | ICD-10-CM

## 2022-02-05 DIAGNOSIS — G2581 Restless legs syndrome: Secondary | ICD-10-CM

## 2022-02-05 MED ORDER — FUROSEMIDE 40 MG PO TABS: 40.0000 mg | ORAL_TABLET | Freq: Every day | ORAL | 3 refills | Status: DC

## 2022-02-05 NOTE — Telephone Encounter (Signed)
Butch Penny from [facility] called about faxed Rx refill requests for: pramipexole 0.125 MG, clonazepam 0.5 MG, carbidopa levodopa 50/200.

## 2022-02-05 NOTE — Telephone Encounter (Signed)
*  STAT* If patient is at the pharmacy, call can be transferred to refill team.   1. Which medications need to be refilled? (please list name of each medication and dose if known) furosemide (LASIX) 40 MG tablet  2. Which pharmacy/location (including street and city if local pharmacy) is medication to be sent to? [place], [place]  3. Do they need a 30 day or 90 day supply? [address]  [facility] calling to have script sent to them.

## 2022-02-06 MED ORDER — CARBIDOPA-LEVODOPA ER 50-200 MG PO TBCR: 1.0000 | EXTENDED_RELEASE_TABLET | Freq: Every day | ORAL | 0 refills | Status: DC

## 2022-02-06 MED ORDER — PRAMIPEXOLE DIHYDROCHLORIDE 0.125 MG PO TABS: 0.1250 mg | ORAL_TABLET | Freq: Every day | ORAL | 0 refills | Status: DC

## 2022-02-06 NOTE — Telephone Encounter (Signed)
[facility] [phone] and spoke to the representative and got the Clonazepam and Pramipexole canceled.

## 2022-02-06 NOTE — Telephone Encounter (Signed)
Called patient and asked where he needs his prescriptions sent because it looks like pramipexole and clonazepam was sent to [facility] and not [place]. Patient stated that he needs them sent to [place].

## 2022-02-06 NOTE — Telephone Encounter (Signed)
Tried to call patients [facility] pharmacy to cancel Clonazepam and Pramipexole but no answer. Will call back.

## 2022-02-06 NOTE — Telephone Encounter (Signed)
Called patient and informed him that we are unable to send the Clonazepam to [place] [facility] and it will have to be sent to [facility]. Patient aware the other two will be sent to [place].

## 2022-02-07 MED ORDER — CLONAZEPAM 0.5 MG PO TABS: ORAL_TABLET | ORAL | 1 refills | Status: DC

## 2022-02-14 ENCOUNTER — Ambulatory Visit: Payer: Medicare HMO

## 2022-02-21 ENCOUNTER — Ambulatory Visit: Payer: Medicare HMO | Attending: Internal Medicine

## 2022-02-21 DIAGNOSIS — I4819 Other persistent atrial fibrillation: Secondary | ICD-10-CM | POA: Diagnosis not present

## 2022-02-21 DIAGNOSIS — Z5181 Encounter for therapeutic drug level monitoring: Secondary | ICD-10-CM

## 2022-02-21 DIAGNOSIS — D6851 Activated protein C resistance: Secondary | ICD-10-CM | POA: Diagnosis not present

## 2022-02-21 LAB — POCT INR: INR: 3.2 — AB (ref 2.0–3.0)

## 2022-02-21 NOTE — Patient Instructions (Signed)
continue taking 1 tablet daily except for 1.5 tablets on Mondays. Recheck INR in 3 weeks. [facility] [phone] or [phone]

## 2022-02-26 ENCOUNTER — Telehealth: Payer: Self-pay | Admitting: Cardiology

## 2022-02-26 ENCOUNTER — Other Ambulatory Visit: Payer: Self-pay

## 2022-02-26 ENCOUNTER — Telehealth: Payer: Self-pay | Admitting: Neurology

## 2022-02-26 DIAGNOSIS — G20 Parkinson's disease: Secondary | ICD-10-CM

## 2022-02-26 MED ORDER — CARBIDOPA-LEVODOPA 25-100 MG PO TABS: ORAL_TABLET | ORAL | 0 refills | Status: DC

## 2022-02-26 NOTE — Telephone Encounter (Signed)
*  STAT* If patient is at the pharmacy, call can be transferred to refill team.   1. Which medications need to be refilled? (please list name of each medication and dose if known)   diltiazem (CARDIZEM CD) 360 MG 24 hr capsule    2. Which pharmacy/location (including street and city if local pharmacy) is medication to be sent to?  [place], [place] Phone:  [phone]         3. Do they need a 30 day or 90 day supply?  90 day

## 2022-02-26 NOTE — Telephone Encounter (Signed)
Sent in refill and called patient to let him know

## 2022-02-26 NOTE — Telephone Encounter (Signed)
Pt called, he needs a refill on his carbi/levo 25-100. He said it needs to be sent to [place].

## 2022-02-27 MED ORDER — DILTIAZEM HCL ER COATED BEADS 360 MG PO CP24: 360.0000 mg | ORAL_CAPSULE | Freq: Every day | ORAL | 1 refills | Status: DC

## 2022-02-27 NOTE — Addendum Note (Signed)
Addended by: Wonda Horner on: 02/27/2022 01:35 PM   Modules accepted: Orders

## 2022-03-13 ENCOUNTER — Telehealth: Payer: Self-pay

## 2022-03-13 NOTE — Patient Outreach (Signed)
  Care Coordination   Initial Visit Note   03/13/2022 Name: Stanley Wright MRN: [id number] DOB: [birth year]  Stanley Wright is a 76 y.o. year old male who sees Scifres, Earlie Server, PA-C (Inactive) for primary care. I spoke with  Stanley Wright by phone today.  What matters to the patients health and wellness today?  none    Goals Addressed             This Visit's Progress    COMPLETED: Care Coordination Activities-No follow up required       Care Coordination Interventions: Advised patient to schedule annual wellness visit with  PCP          SDOH assessments and interventions completed:  Yes     Care Coordination Interventions Activated:  Yes  Care Coordination Interventions:  Yes, provided   Follow up plan: No further intervention required.   Encounter Outcome:  Pt. Visit Completed   Jone Baseman, RN, MSN [facility] Care Management Coordinator Direct Line [phone]

## 2022-03-13 NOTE — Patient Instructions (Signed)
Visit Information  Thank you for taking time to visit with me today. Please don't hesitate to contact me if I can be of assistance to you.   Following are the goals we discussed today:   Goals Addressed             This Visit's Progress    COMPLETED: Care Coordination Activities-No follow up required       Care Coordination Interventions: Advised patient to schedule annual wellness visit with  PCP           If you are experiencing a Mental Health or [place] or need someone to talk to, please call the Suicide and Crisis Lifeline: 988   Patient verbalizes understanding of instructions and care plan provided today and agrees to view in [place]. Active MyChart status and patient understanding of how to access instructions and care plan via MyChart confirmed with patient.     No further follow up required: patient declined  Jone Baseman, RN, MSN [facility] Care Management Coordinator Direct Line [phone]

## 2022-03-14 ENCOUNTER — Ambulatory Visit: Payer: Medicare HMO | Attending: Internal Medicine | Admitting: *Deleted

## 2022-03-14 DIAGNOSIS — I4819 Other persistent atrial fibrillation: Secondary | ICD-10-CM | POA: Diagnosis not present

## 2022-03-14 DIAGNOSIS — Z5181 Encounter for therapeutic drug level monitoring: Secondary | ICD-10-CM

## 2022-03-14 DIAGNOSIS — D6851 Activated protein C resistance: Secondary | ICD-10-CM | POA: Diagnosis not present

## 2022-03-14 LAB — POCT INR: INR: 3.8 — AB (ref 2.0–3.0)

## 2022-03-14 NOTE — Patient Instructions (Signed)
Description   Do not take any warfarin today then start taking 1 tablet daily.Recheck INR in 3 weeks. [facility] [phone] or [phone]

## 2022-03-21 ENCOUNTER — Other Ambulatory Visit: Payer: Self-pay

## 2022-03-21 MED ORDER — WARFARIN SODIUM 5 MG PO TABS: ORAL_TABLET | ORAL | 1 refills | Status: DC

## 2022-03-24 ENCOUNTER — Other Ambulatory Visit: Payer: Self-pay | Admitting: Pharmacist

## 2022-03-24 NOTE — Telephone Encounter (Signed)
Received warfarin request from [facility] - mail order pharmacy. On 10/06 120 tabs with 1 refill were sent to the [facility]. Patient will get it from there this time and will send next refill to the [facility] per patient's request.

## 2022-03-25 ENCOUNTER — Other Ambulatory Visit: Payer: Self-pay

## 2022-03-26 NOTE — Telephone Encounter (Signed)
Refill request for warfarin:  Last INR was 3.8 on 03/14/22 Next INR due on 04/04/22 LOV was 12/06/21  E Monge NP  Refill was sent in on 03/21/22 and confirmed by pharmacy.

## 2022-03-27 ENCOUNTER — Other Ambulatory Visit: Payer: Self-pay

## 2022-03-27 DIAGNOSIS — I4819 Other persistent atrial fibrillation: Secondary | ICD-10-CM

## 2022-03-27 MED ORDER — WARFARIN SODIUM 5 MG PO TABS: ORAL_TABLET | ORAL | 1 refills | Status: DC

## 2022-03-27 NOTE — Telephone Encounter (Signed)
Prescription refill request received for warfarin [place]: 12/06/21 [facility]) Next INR check: 04/04/22 Warfarin tablet strength: '5mg'$   Appropriate dose and refill sent to requested pharmacy.

## 2022-03-31 NOTE — Progress Notes (Unsigned)
Assessment/Plan:   1.   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               -Take carbidopa/levodopa 25/100, 1 tablet at 7 AM/10 AM/1 PM/4 PM/7 PM.  He sometimes takes a 6th pill  -Take carbidopa/levodopa 50/200 CR at bedtime             -The patient had a DaTscan in February, 2020 and there was near absent dopamine in the bilateral putamen.    -Can consider on/off in the future, but overall patient thinks levodopa is helping.  -walker at all times - has several falls but none with the walker.  Discussed this again today.  He has a U step walker and just needs to use that at all times.    2.  Cervical dystonia             -Patient wants to hold on Botox.   3.       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  -had surgery on eyes and it didn't help   4.  dysphagia             -feels that swallow is okay right now   5.  Atrial fibrillation             -Following with cardiology.  On diltiazem.   6.  Factor V Leiden             -On Coumadin.  7.  RBD and restless leg  -Clonazepam, 0.5 mg, half tablet at bedtime.  The addition was helpful  -PDMP is reviewed.  No red flags.  Last filled March 07, 2022  -Continue pramipexole, 0.125 mg at bedtime.  This low dosage has helped.   Subjective:   IRMA DELANCEY was seen today in follow up for Parkinsons disease.  My previous records were reviewed prior to todays visit as well as outside records available to me.  Patient was seen by nurse practitioner at orthopedics on August 4 for knee pain, which occurred after a fall on the right knee.  No fractures.  He has been to some physical therapy since her last visit.  He has not had any hallucinations.  Current prescribed movement disorder medications: Carbidopa/levodopa 25/100, 1 tablet at 7 AM/10 AM/1 PM/4 PM/7 PM (takes 6 per day sometimes) Carbidopa/levodopa to 50/200 at  bedtime  Clonazepam 0.5 mg, half tablet at bedtime Pramipexole 0.125 mg at bedtime (started last visit)   PREVIOUS MEDICATIONS: Sinemet and Sinemet CR; Inbrija (patient had trouble using it); clonazepam, 0.5 mg, 1 tablet at bedtime (did not help more than the half tablet)  ALLERGIES:   Allergies  Allergen Reactions   Tylenol [Acetaminophen] Other (See Comments)    Pt states he had a DNA test completed that stated he should never take tylenol.    CURRENT MEDICATIONS:  Outpatient Encounter Medications as of 04/02/2022  Medication Sig   carbidopa-levodopa (SINEMET CR) 50-200 MG tablet Take 1 tablet by mouth at bedtime.   carbidopa-levodopa (SINEMET IR) 25-100 MG tablet 1 tablet 6 times a day   clonazePAM (KLONOPIN) 0.5 MG tablet TAKE 1/2 (ONE-HALF) TABLET BY MOUTH AT BEDTIME   diltiazem (CARDIZEM CD) 360 MG 24 hr capsule Take 1 capsule (360 mg total) by  mouth daily.   furosemide (LASIX) 40 MG tablet Take 1 tablet (40 mg total) by mouth daily.   hydroxychloroquine (PLAQUENIL) 200 MG tablet Take 200 mg by mouth once a week.   pramipexole (MIRAPEX) 0.125 MG tablet Take 1 tablet (0.125 mg total) by mouth at bedtime.   warfarin (COUMADIN) 5 MG tablet Take 1 tablet daily or as prescribed by Coumadin Clinic   No facility-administered encounter medications on file as of 04/02/2022.    Objective:   PHYSICAL EXAMINATION:    VITALS:   There were no vitals filed for this visit.     GEN:  The patient appears stated age and is in NAD. HEENT:  Normocephalic, atraumatic.  The mucous membranes are moist.  Neck/HEME:  There are no carotid bruits bilaterally.  Chin approximates the chest.  Neck is flexed.  R ear to R shoulder  Neurological examination:  Orientation: The patient is alert and oriented x3. Cranial nerves: There is good facial symmetry with facial hypomimia.  He has complete upgaze paresis and mild downgaze paresis.  The speech is fluent and dysarthric and hypophonic. Soft palate  rises symmetrically and there is no tongue deviation. Hearing is intact to conversational tone. Sensation: Sensation is intact to light touch throughout Motor: Strength is at least antigravity x4.   Movement examination: Tone: There is nl tone in the UE Abnormal movements: none Coordination:  There is decremation with RAM's, with any form of RAMS, including alternating supination and pronation of the forearm, hand opening and closing, finger taps, heel taps and toe taps on the L>R Gait and Station: Ambulates ok with walker.  Drags the L leg, partially due to foot drop on the left.  I have reviewed and interpreted the following labs independently    Chemistry      Component Value Date/Time   NA 141 11/06/2020 0943   K 4.0 11/06/2020 0943   CL 101 11/06/2020 0943   CO2 24 11/06/2020 0943   BUN 22 11/06/2020 0943   CREATININE 1.06 11/06/2020 0943   CREATININE 0.88 06/12/2015 1505      Component Value Date/Time   CALCIUM 9.0 11/06/2020 0943   ALKPHOS 62 01/17/2019 1321   AST 12 01/17/2019 1321   ALT 6 01/17/2019 1321   BILITOT 0.4 01/17/2019 1321       Lab Results  Component Value Date   WBC 4.3 11/06/2020   HGB 14.9 11/06/2020   HCT 45.0 11/06/2020   MCV 97 11/06/2020   PLT 168 11/06/2020    Lab Results  Component Value Date   TSH 2.88 10/08/2015    Total time spent on today's visit was *** minutes, including both face-to-face time and nonface-to-face time.  Time included that spent on review of records (prior notes available to me/labs/imaging if pertinent), discussing treatment and goals, answering patient's questions and coordinating care.

## 2022-04-02 ENCOUNTER — Encounter: Payer: Self-pay | Admitting: Neurology

## 2022-04-02 ENCOUNTER — Telehealth: Payer: Medicare HMO | Admitting: Neurology

## 2022-04-02 VITALS — BP 108/71 | HR 98 | Ht 72.0 in | Wt 242.0 lb

## 2022-04-02 DIAGNOSIS — G2581 Restless legs syndrome: Secondary | ICD-10-CM

## 2022-04-02 DIAGNOSIS — G20C Parkinsonism, unspecified: Secondary | ICD-10-CM | POA: Diagnosis not present

## 2022-04-02 MED ORDER — PRAMIPEXOLE DIHYDROCHLORIDE 0.25 MG PO TABS: 0.2500 mg | ORAL_TABLET | Freq: Every day | ORAL | 1 refills | Status: DC

## 2022-04-02 NOTE — Patient Instructions (Addendum)
Take pramipexole 0.125 mg, TWO at bedtime until that RX runs out and then I sent a RX for pramipexole 0.25 mg and you will take ONE of those at bed  Local and Online Resources for Power over Parkinson's Group  October 2023    [place] over Parkinson's Group:    Power Over Parkinson's Patient Education Group will be Wednesday, October 11th-*Hybrid meting*- in person at [place] location and via [facility] at 2 pm.   Upcoming Power over [employer] Meetings:  2nd Wednesdays of the month at 2 pm:   October 11th, November 8th, December 13th  Contact Amy Marriott at [email or web address] if interested in participating in this group    Mobile! Moves [employer] Instructor-Led Classes offering at [employer]!  TUESDAYS and Wednesdays 1-2 pm.   Contact Vonna Kotyk at  [email or web address] or Caron Presume at [place], [email or web address]  Dance for Parkinson 's classes will be on Tuesdays 9:30am-10:30am starting October 3-December 12 with a break the week of November 21st. Located in the December 04 which is in the first floor of the [employer]  ([place].) To register:  [email or web address] or [phone]  Drumming for Parkinson's will be held on 2nd and 4th Mondays at 11:00 am.   Located at the [place] ([place]  [place] at [email or web address] or [phone]  Through support from the [place] for Parkinson's classes are free for both patients and caregivers.    [employer] Parkinson's Tai Chi Class, Mondays at 11 am.  Call [phone] for details   [place]:  www.[email or web address].org  PD Health at Home continues:  Mindfulness Mondays, Wellness Wednesdays, Fitness Fridays   Upcoming Education:     Parkinson's 101:  What you and your family should know.  Wednesday, Oct. 4th 1-2 pm  Expert Briefing:     Parkinson's and the Gut-Brain Connection.  Wednesday, Oct. 11th 1-2 pm  Hallucinations and Delusions in Parkinson's.  Wednesday, Nov. 8th, 1-2 pm  Register for expert briefings (webinars) at 10-18-1986  Please check out their website to sign up for emails and see their full online offerings      [place]:  [email or web address]   Third Thursday Webinars:  On the third Thursday of every month at 12 p.m. ET, join our free live webinars to learn about various aspects of living with Parkinson's disease and our work to speed medical breakthroughs.  Upcoming Webinar:  Wednesday for Surveyor, mining. (Replay).  Thursday, Oct. 12th at 12 noon  Check out additional information on their website to see their full online offerings    [place]:  [email or web address]  Upcoming Webinar:   Stay tuned  Webinar Series:  Living with Parkinson's Meetup.   Third Thursdays each month, 3 pm  Care Partner Monthly Meetup.  With 07-31-1985 [place].  First Tuesday of each month, 2 pm  Check out additional information to Live Well Today on their website    Parkinson and Movement Disorders (PMD) Alliance:  www.pmdalliance.org  NeuroLife Online:  Online Education Events  Sign up for emails, which are sent weekly to give you updates on programming and online offerings    Parkinson's Association of the [place]:  www.parkinsonassociation.org  Information on online support groups, education events, and online exercises including Yoga, Parkinson's exercises and  more-LOTS of information on links to PD resources and online events  Virtual Support Group through Parkinson's Association of the [place]; next one is scheduled for Wednesday, October 4th at 2 pm.  (These are  typically scheduled for the 1st Wednesday of the month at 2 pm).  Visit website for details.   MOVEMENT AND EXERCISE OPPORTUNITIES  PWR! Moves Classes at [place].  Wednesdays 10 and 11 am.   Contact Amy Marriott, PT [email or web address] if interested.  NEW PWR! Moves Class offerings at [employer].  *TUESDAYS* and Wednesdays 1-2 pm.  Contact Vonna Kotyk at  [email or web address] or Caron Presume at [place],  [email or web address]  Parkinson's Wellness Recovery (PWR! Moves)  www.[email or web address]  Info on the PWR! Virtual Experience:  You will have access to our expertise?through self-assessment, guided plans that start with the PD-specific fundamentals, educational content, tips, Q&A with an expert, and a growing [employer] of PD-specific pre-recorded and live exercise classes of varying types and intensity - both physical and cognitive! If that is not enough, we offer 1:1 wellness consultations (in-person or virtual) to personalize your PWR! Art therapist.   [place] Fridays:   As part of the PD Health @ Home program, this free video series focuses each week on one aspect of fitness designed to support people living with Parkinson's.? These weekly videos highlight the [place] fitness guidelines for people with Parkinson's disease.  [address]  Dance for PD website is offering free, live-stream classes throughout the week, as well as links to ModemGamers.si of classes:  [email or web address].[email or web address]  Virtual dance and Pilates for Parkinson's classes: Click on the Community Tab> Parkinson's Movement Initiative Tab.  To register for classes and for more information, visit www.[employer] and click the "community" tab.   [employer] Parkinson's Cycling Classes   [employer]:  Thursdays @ Noon-Live classes at 07-31-1985 ([employer] at  [email or web address]?or [phone])  [employer]: Virtual Classes Mondays and Thursdays 07-31-1985 classes Tuesday, Wednesday and Thursday (contact [place] at [email or web address] ?or [phone])  [place]  Varied levels of classes are offered Tuesdays and Thursdays at 07-31-1985.   Stretching with [employer] weekly class is also offered for people with Parkinson's  To observe a class or for more information, call [phone] or email [phone] at [email or web address]   ADDITIONAL SUPPORT AND RESOURCES  Well-Spring [employer]:Online Caregiver Education Opportunities:  www.well-springsolutions.org/caregiver-education/caregiver-support-group.  You may also contact Hezzie Bump at [email or web address] or [phone].     Well-Spring Navigator:  Just1Navigator program, a?free service to help individuals and families through the journey of determining care for older adults.  The "Navigator" is a 08-16-1988, [phone], who will speak with a prospective client and/or loved ones to provide an assessment of the situation and a set of recommendations for a personalized care plan -- all free of charge, and whether?Well-Spring Solutions offers the needed service or not. If the need is not a service we provide, we are well-connected with reputable programs in town that we can refer you to.  www.well-springsolutions.org or to speak with the Navigator, call [phone].

## 2022-04-04 ENCOUNTER — Ambulatory Visit: Payer: Medicare HMO | Attending: Internal Medicine | Admitting: *Deleted

## 2022-04-04 DIAGNOSIS — D6851 Activated protein C resistance: Secondary | ICD-10-CM

## 2022-04-04 DIAGNOSIS — Z5181 Encounter for therapeutic drug level monitoring: Secondary | ICD-10-CM

## 2022-04-04 DIAGNOSIS — I4819 Other persistent atrial fibrillation: Secondary | ICD-10-CM

## 2022-04-04 LAB — POCT INR: INR: 2.9 (ref 2.0–3.0)

## 2022-04-04 NOTE — Patient Instructions (Signed)
Description   Continue taking warfarin 1 tablet ('5mg'$ ) daily. Recheck INR in 3 weeks.Coumadin Clinic [phone] or [phone]

## 2022-04-25 ENCOUNTER — Ambulatory Visit: Payer: Medicare HMO | Attending: Cardiovascular Disease | Admitting: *Deleted

## 2022-04-25 DIAGNOSIS — Z5181 Encounter for therapeutic drug level monitoring: Secondary | ICD-10-CM

## 2022-04-25 DIAGNOSIS — I4819 Other persistent atrial fibrillation: Secondary | ICD-10-CM | POA: Diagnosis not present

## 2022-04-25 DIAGNOSIS — D6851 Activated protein C resistance: Secondary | ICD-10-CM | POA: Diagnosis not present

## 2022-04-25 LAB — POCT INR: INR: 2.5 (ref 2.0–3.0)

## 2022-04-25 NOTE — Patient Instructions (Signed)
Description   Continue taking warfarin 1 tablet ('5mg'$ ) daily. Recheck INR in 4 weeks. Coumadin Clinic [phone] or [phone]

## 2022-05-23 ENCOUNTER — Ambulatory Visit: Payer: Medicare HMO | Attending: Internal Medicine | Admitting: *Deleted

## 2022-05-23 DIAGNOSIS — I4819 Other persistent atrial fibrillation: Secondary | ICD-10-CM

## 2022-05-23 DIAGNOSIS — D6851 Activated protein C resistance: Secondary | ICD-10-CM

## 2022-05-23 DIAGNOSIS — Z5181 Encounter for therapeutic drug level monitoring: Secondary | ICD-10-CM

## 2022-05-23 LAB — POCT INR: INR: 1.4 — AB (ref 2.0–3.0)

## 2022-05-23 NOTE — Patient Instructions (Signed)
Description   Today take 1.5 tablets and tomorrow take 1.5 tablets then continue taking warfarin 1 tablet ('5mg'$ ) daily. Recheck INR in 1 week. Coumadin Clinic [phone] or [phone]

## 2022-05-29 ENCOUNTER — Telehealth: Payer: Self-pay | Admitting: Physical Therapy

## 2022-05-29 ENCOUNTER — Other Ambulatory Visit: Payer: Self-pay | Admitting: Neurology

## 2022-05-29 DIAGNOSIS — G20A1 Parkinson's disease without dyskinesia, without mention of fluctuations: Secondary | ICD-10-CM

## 2022-05-29 DIAGNOSIS — G20C Parkinsonism, unspecified: Secondary | ICD-10-CM

## 2022-05-29 NOTE — Telephone Encounter (Signed)
Dr. Carles Collet,  Leeanne Rio is scheduled for  PT, and ST re-evaluations on 06/23/21 as recommended when patient was last discharged from therapy due to progressive nature of diagnosis.  Pt was in agreement with this plan.  If you are in agreement, please send updated order for PT, and ST via epic at [address] OP neuro location.  Thank you, Janann August, PT, DPT 05/29/22 11:24 AM    [facility] [address] [place], [place]  [address] Phone:  [phone] Fax:  [phone]

## 2022-05-30 ENCOUNTER — Ambulatory Visit: Payer: Medicare HMO

## 2022-06-04 ENCOUNTER — Ambulatory Visit: Payer: Medicare HMO | Attending: Internal Medicine

## 2022-06-04 DIAGNOSIS — I4819 Other persistent atrial fibrillation: Secondary | ICD-10-CM | POA: Diagnosis not present

## 2022-06-04 DIAGNOSIS — Z5181 Encounter for therapeutic drug level monitoring: Secondary | ICD-10-CM

## 2022-06-04 LAB — POCT INR: INR: 1.9 — AB (ref 2.0–3.0)

## 2022-06-04 NOTE — Patient Instructions (Signed)
Description   START taking warfarin 1 tablet ('5mg'$ ) daily EXCEPT 1.5 tablets on Wednesdays.  Recheck INR in 2 weeks.  Coumadin Clinic [phone]

## 2022-06-13 ENCOUNTER — Other Ambulatory Visit: Payer: Self-pay | Admitting: Neurology

## 2022-06-17 DIAGNOSIS — H25812 Combined forms of age-related cataract, left eye: Secondary | ICD-10-CM | POA: Diagnosis not present

## 2022-06-17 DIAGNOSIS — H2512 Age-related nuclear cataract, left eye: Secondary | ICD-10-CM | POA: Diagnosis not present

## 2022-06-20 ENCOUNTER — Ambulatory Visit: Payer: PPO | Attending: Cardiology

## 2022-06-20 DIAGNOSIS — Z5181 Encounter for therapeutic drug level monitoring: Secondary | ICD-10-CM | POA: Diagnosis not present

## 2022-06-20 DIAGNOSIS — D6851 Activated protein C resistance: Secondary | ICD-10-CM

## 2022-06-20 DIAGNOSIS — I4819 Other persistent atrial fibrillation: Secondary | ICD-10-CM | POA: Diagnosis not present

## 2022-06-20 LAB — POCT INR: INR: 1.8 — AB (ref 2.0–3.0)

## 2022-06-20 NOTE — Patient Instructions (Signed)
TAKE 2 TABLETS TODAY ONLY THEN CONTINUE  1 tablet ('5mg'$ ) daily EXCEPT 1.5 tablets on Wednesdays.  Recheck INR in 3 weeks.  Coumadin Clinic [phone]

## 2022-06-23 ENCOUNTER — Ambulatory Visit: Payer: Medicare HMO | Admitting: Speech Pathology

## 2022-06-23 ENCOUNTER — Ambulatory Visit: Payer: Medicare HMO | Admitting: Physical Therapy

## 2022-07-09 DIAGNOSIS — H25011 Cortical age-related cataract, right eye: Secondary | ICD-10-CM | POA: Diagnosis not present

## 2022-07-09 DIAGNOSIS — H2511 Age-related nuclear cataract, right eye: Secondary | ICD-10-CM | POA: Diagnosis not present

## 2022-07-11 ENCOUNTER — Ambulatory Visit: Payer: PPO

## 2022-07-15 DIAGNOSIS — H25811 Combined forms of age-related cataract, right eye: Secondary | ICD-10-CM | POA: Diagnosis not present

## 2022-07-15 DIAGNOSIS — H25011 Cortical age-related cataract, right eye: Secondary | ICD-10-CM | POA: Diagnosis not present

## 2022-07-15 DIAGNOSIS — H2511 Age-related nuclear cataract, right eye: Secondary | ICD-10-CM | POA: Diagnosis not present

## 2022-07-24 ENCOUNTER — Ambulatory Visit: Payer: HMO | Attending: Cardiology | Admitting: *Deleted

## 2022-07-24 DIAGNOSIS — Z5181 Encounter for therapeutic drug level monitoring: Secondary | ICD-10-CM

## 2022-07-24 DIAGNOSIS — D6851 Activated protein C resistance: Secondary | ICD-10-CM | POA: Diagnosis not present

## 2022-07-24 DIAGNOSIS — I4819 Other persistent atrial fibrillation: Secondary | ICD-10-CM | POA: Diagnosis not present

## 2022-07-24 LAB — POCT INR: INR: 1.5 — AB (ref 2.0–3.0)

## 2022-07-24 NOTE — Patient Instructions (Addendum)
Description   Today and tomorrow take 1.5 tablets of warfarin then START taking  1 tablet ('5mg'$ ) daily EXCEPT 1.5 tablets on Sundays and Wednesdays. Recheck INR in 2 weeks.  Coumadin Clinic [phone]

## 2022-08-06 DIAGNOSIS — G44209 Tension-type headache, unspecified, not intractable: Secondary | ICD-10-CM | POA: Diagnosis not present

## 2022-08-06 DIAGNOSIS — M9901 Segmental and somatic dysfunction of cervical region: Secondary | ICD-10-CM | POA: Diagnosis not present

## 2022-08-06 DIAGNOSIS — M542 Cervicalgia: Secondary | ICD-10-CM | POA: Diagnosis not present

## 2022-08-06 DIAGNOSIS — M47812 Spondylosis without myelopathy or radiculopathy, cervical region: Secondary | ICD-10-CM | POA: Diagnosis not present

## 2022-08-07 ENCOUNTER — Ambulatory Visit: Payer: HMO

## 2022-08-12 ENCOUNTER — Ambulatory Visit: Payer: HMO | Attending: Cardiology

## 2022-08-12 DIAGNOSIS — I4819 Other persistent atrial fibrillation: Secondary | ICD-10-CM | POA: Diagnosis not present

## 2022-08-12 DIAGNOSIS — Z5181 Encounter for therapeutic drug level monitoring: Secondary | ICD-10-CM

## 2022-08-12 DIAGNOSIS — D6851 Activated protein C resistance: Secondary | ICD-10-CM

## 2022-08-12 LAB — POCT INR: INR: 1.3 — AB (ref 2.0–3.0)

## 2022-08-12 NOTE — Patient Instructions (Signed)
Description   Take 2 tablets today, then START taking  1 tablet ('5mg'$ ) daily EXCEPT 1.5 tablets on Sundays and Wednesdays. Recheck INR in 1 week.  Coumadin Clinic [phone]

## 2022-08-13 DIAGNOSIS — G44209 Tension-type headache, unspecified, not intractable: Secondary | ICD-10-CM | POA: Diagnosis not present

## 2022-08-13 DIAGNOSIS — M47812 Spondylosis without myelopathy or radiculopathy, cervical region: Secondary | ICD-10-CM | POA: Diagnosis not present

## 2022-08-13 DIAGNOSIS — M9901 Segmental and somatic dysfunction of cervical region: Secondary | ICD-10-CM | POA: Diagnosis not present

## 2022-08-13 DIAGNOSIS — M542 Cervicalgia: Secondary | ICD-10-CM | POA: Diagnosis not present

## 2022-08-19 ENCOUNTER — Ambulatory Visit: Payer: HMO | Attending: Cardiology | Admitting: *Deleted

## 2022-08-19 DIAGNOSIS — Z5181 Encounter for therapeutic drug level monitoring: Secondary | ICD-10-CM

## 2022-08-19 DIAGNOSIS — D6851 Activated protein C resistance: Secondary | ICD-10-CM | POA: Diagnosis not present

## 2022-08-19 DIAGNOSIS — I4819 Other persistent atrial fibrillation: Secondary | ICD-10-CM | POA: Diagnosis not present

## 2022-08-19 LAB — POCT INR: INR: 2.7 (ref 2.0–3.0)

## 2022-08-19 NOTE — Patient Instructions (Signed)
Description   Continue taking 1 tablet ('5mg'$ ) daily EXCEPT 1.5 tablets on Sundays and Wednesdays. Recheck INR in 2 weeks.  Coumadin Clinic [phone]

## 2022-08-20 ENCOUNTER — Ambulatory Visit: Payer: HMO

## 2022-08-20 ENCOUNTER — Ambulatory Visit (INDEPENDENT_AMBULATORY_CARE_PROVIDER_SITE_OTHER): Payer: HMO

## 2022-08-20 ENCOUNTER — Ambulatory Visit (INDEPENDENT_AMBULATORY_CARE_PROVIDER_SITE_OTHER): Payer: HMO | Admitting: Podiatry

## 2022-08-20 DIAGNOSIS — M19071 Primary osteoarthritis, right ankle and foot: Secondary | ICD-10-CM

## 2022-08-20 DIAGNOSIS — M79675 Pain in left toe(s): Secondary | ICD-10-CM

## 2022-08-20 DIAGNOSIS — M21611 Bunion of right foot: Secondary | ICD-10-CM | POA: Diagnosis not present

## 2022-08-20 DIAGNOSIS — M542 Cervicalgia: Secondary | ICD-10-CM | POA: Diagnosis not present

## 2022-08-20 DIAGNOSIS — G44209 Tension-type headache, unspecified, not intractable: Secondary | ICD-10-CM | POA: Diagnosis not present

## 2022-08-20 DIAGNOSIS — M79674 Pain in right toe(s): Secondary | ICD-10-CM | POA: Diagnosis not present

## 2022-08-20 DIAGNOSIS — M21619 Bunion of unspecified foot: Secondary | ICD-10-CM

## 2022-08-20 DIAGNOSIS — B351 Tinea unguium: Secondary | ICD-10-CM | POA: Diagnosis not present

## 2022-08-20 DIAGNOSIS — M9901 Segmental and somatic dysfunction of cervical region: Secondary | ICD-10-CM | POA: Diagnosis not present

## 2022-08-20 DIAGNOSIS — M47812 Spondylosis without myelopathy or radiculopathy, cervical region: Secondary | ICD-10-CM | POA: Diagnosis not present

## 2022-08-20 NOTE — Progress Notes (Signed)
   Chief Complaint  Patient presents with   Bunions    Right foot 5th toe callus,     SUBJECTIVE Patient presents to office today complaining of elongated, thickened nails that cause pain while ambulating in shoes.  Patient is unable to trim their own nails.  Patient also has a very symptomatic callus to the lateral aspect of the right fifth toe.  Patient is here for further evaluation and treatment.  Past Medical History:  Diagnosis Date   Arthritis    BENIGN PROSTATIC HYPERTROPHY, WITH OBSTRUCTION    Cancer ([place])    skin, basal, squamous   COLONIC POLYPS    Diverticulitis    DVT (deep venous thrombosis) ([place]) 2000   Dysrhythmia    Hx Afib- 2017   Early cataract    Factor 5 Leiden mutation, heterozygous ([place])    GERD (gastroesophageal reflux disease)    not on medication   Hearing aid worn    B/L   HIATAL HERNIA    ITP (idiopathic thrombocytopenic purpura) 2003   Long term current use of anticoagulant    Osteoarthritis    right foot   Parkinson's disease 02/03/2018   PSORIASIS    Pulmonary embolus ([facility]) 2000   Shortness of breath dyspnea    at times - Talking alot   Sleep apnea    Wears glasses     Allergies  Allergen Reactions   Tylenol [Acetaminophen] Other (See Comments)    Pt states he had a DNA test completed that stated he should never take tylenol.     OBJECTIVE General Patient is awake, alert, and oriented x 3 and in no acute distress. Derm Skin is dry and supple bilateral. Negative open lesions or macerations. Remaining integument unremarkable. Nails are tender, long, thickened and dystrophic with subungual debris, consistent with onychomycosis, 1-5 bilateral. No signs of infection noted.  There is also symptomatic preulcerative callus lesion noted to the lateral aspect of the right fifth digit that is very tender with palpation Vasc  DP and PT pedal pulses palpable bilaterally. Temperature gradient within normal limits.  Chronic edema noted  bilateral Neuro Epicritic and protective threshold sensation grossly intact bilaterally.  Musculoskeletal Exam No symptomatic pedal deformities noted bilateral. Muscular strength within normal limits.  ASSESSMENT 1.  Pain due to onychomycosis of toenails both 2. H/o ORIF RT midfoot arthrodesis 2021 secondary to trauma 3.  Factor V Leiden on long-term anticoagulant  PLAN OF CARE 1. Patient evaluated today.  2. Instructed to maintain good pedal hygiene and foot care.  3. Mechanical debridement of nails 1-5 bilaterally performed using a nail nipper. Filed with dremel without incident.  4.  The preulcerative callus to the right fifth digit was debrided using a 312 scalpel without incident or bleeding.  Patient felt relief.  Silicone toe cap dispensed.  Wear daily with his shoes. 5.  Return to clinic as needed   Edrick Kins, DPM [facility]  Dr. Edrick Kins, DPM    [address], [place] [address]                Office [phone]  Fax [phone]

## 2022-08-22 ENCOUNTER — Other Ambulatory Visit: Payer: Self-pay | Admitting: Neurology

## 2022-08-22 DIAGNOSIS — G20C Parkinsonism, unspecified: Secondary | ICD-10-CM

## 2022-08-27 DIAGNOSIS — M542 Cervicalgia: Secondary | ICD-10-CM | POA: Diagnosis not present

## 2022-08-27 DIAGNOSIS — G44209 Tension-type headache, unspecified, not intractable: Secondary | ICD-10-CM | POA: Diagnosis not present

## 2022-08-27 DIAGNOSIS — M9901 Segmental and somatic dysfunction of cervical region: Secondary | ICD-10-CM | POA: Diagnosis not present

## 2022-08-27 DIAGNOSIS — M47812 Spondylosis without myelopathy or radiculopathy, cervical region: Secondary | ICD-10-CM | POA: Diagnosis not present

## 2022-09-03 ENCOUNTER — Ambulatory Visit: Payer: HMO | Attending: Cardiology | Admitting: *Deleted

## 2022-09-03 DIAGNOSIS — D6851 Activated protein C resistance: Secondary | ICD-10-CM

## 2022-09-03 DIAGNOSIS — M542 Cervicalgia: Secondary | ICD-10-CM | POA: Diagnosis not present

## 2022-09-03 DIAGNOSIS — Z5181 Encounter for therapeutic drug level monitoring: Secondary | ICD-10-CM

## 2022-09-03 DIAGNOSIS — M9901 Segmental and somatic dysfunction of cervical region: Secondary | ICD-10-CM | POA: Diagnosis not present

## 2022-09-03 DIAGNOSIS — G44209 Tension-type headache, unspecified, not intractable: Secondary | ICD-10-CM | POA: Diagnosis not present

## 2022-09-03 DIAGNOSIS — M47812 Spondylosis without myelopathy or radiculopathy, cervical region: Secondary | ICD-10-CM | POA: Diagnosis not present

## 2022-09-03 DIAGNOSIS — I4819 Other persistent atrial fibrillation: Secondary | ICD-10-CM

## 2022-09-03 LAB — POCT INR: INR: 2.2 (ref 2.0–3.0)

## 2022-09-03 NOTE — Patient Instructions (Signed)
Description   Continue taking 1 tablet (5mg ) daily EXCEPT 1.5 tablets on Sundays and Wednesdays. Recheck INR in 3 weeks.  Coumadin Clinic [phone]

## 2022-09-09 ENCOUNTER — Telehealth: Payer: Self-pay | Admitting: Cardiology

## 2022-09-09 NOTE — Telephone Encounter (Signed)
Left detailed message. Will call again tomorrow

## 2022-09-09 NOTE — Telephone Encounter (Signed)
Pt called in stating he needs Dr. Stanford Breed to fill out something from his insurance. I asked what exactly what it is, but he was unsure, just kept saying he just needs something filled out. I asked if they have already sent this paperwork over and he said yes. Please advise.

## 2022-09-10 NOTE — Telephone Encounter (Signed)
Left message to call back  

## 2022-09-11 NOTE — Telephone Encounter (Signed)
Spoke with Lennette Bihari and he stated he just faxed forms again.Once forms are received they will be placed in provider box for review.

## 2022-09-11 NOTE — Telephone Encounter (Signed)
Lennette Bihari from [employer] called in stating they faxed over some paperwork for pt's mental competency and they need Dr. Jacalyn Lefevre signature. Informed him I didn't see anything scanned into pt's chart and asked that he fax it again to [phone]

## 2022-09-12 NOTE — Telephone Encounter (Signed)
Found faxed document, document is very vague. Will forward to Dr Stanford Breed. Returned call to "[facility] informed that Dr Stanford Breed is out of the office until 09-22-22. Verbalized understanding.  Pt notified of above. Suggested he discuss this with PCP, he states that he does not have one.

## 2022-09-12 NOTE — Telephone Encounter (Signed)
Lennette Bihari from [place] is following up on the mental competency certification papers they faxed over. He would like to know if the office has received them. Please advise.

## 2022-09-17 DIAGNOSIS — G44209 Tension-type headache, unspecified, not intractable: Secondary | ICD-10-CM | POA: Diagnosis not present

## 2022-09-17 DIAGNOSIS — M47812 Spondylosis without myelopathy or radiculopathy, cervical region: Secondary | ICD-10-CM | POA: Diagnosis not present

## 2022-09-17 DIAGNOSIS — M542 Cervicalgia: Secondary | ICD-10-CM | POA: Diagnosis not present

## 2022-09-17 DIAGNOSIS — M9901 Segmental and somatic dysfunction of cervical region: Secondary | ICD-10-CM | POA: Diagnosis not present

## 2022-09-19 NOTE — Telephone Encounter (Signed)
Called Kevin-made aware to defer to PCP.

## 2022-09-24 ENCOUNTER — Ambulatory Visit: Payer: HMO | Attending: Internal Medicine

## 2022-09-24 DIAGNOSIS — M9901 Segmental and somatic dysfunction of cervical region: Secondary | ICD-10-CM | POA: Diagnosis not present

## 2022-09-24 DIAGNOSIS — I4819 Other persistent atrial fibrillation: Secondary | ICD-10-CM

## 2022-09-24 DIAGNOSIS — M542 Cervicalgia: Secondary | ICD-10-CM | POA: Diagnosis not present

## 2022-09-24 DIAGNOSIS — Z5181 Encounter for therapeutic drug level monitoring: Secondary | ICD-10-CM

## 2022-09-24 DIAGNOSIS — G44209 Tension-type headache, unspecified, not intractable: Secondary | ICD-10-CM | POA: Diagnosis not present

## 2022-09-24 DIAGNOSIS — M47812 Spondylosis without myelopathy or radiculopathy, cervical region: Secondary | ICD-10-CM | POA: Diagnosis not present

## 2022-09-24 LAB — POCT INR: INR: 1.7 — AB (ref 2.0–3.0)

## 2022-09-24 NOTE — Patient Instructions (Signed)
Description   Take 2 tablets today and then continue taking 1 tablet (5mg ) daily EXCEPT 1.5 tablets on Sundays and Wednesdays. Recheck INR in 3 weeks.  Coumadin Clinic [phone]

## 2022-09-25 ENCOUNTER — Encounter: Payer: Self-pay | Admitting: *Deleted

## 2022-09-25 ENCOUNTER — Telehealth: Payer: Self-pay | Admitting: Cardiology

## 2022-09-25 NOTE — Telephone Encounter (Signed)
Patient states yesterday afternoon he left a form for Dr. Jens Som to sign and he is calling to follow up on it.

## 2022-09-25 NOTE — Telephone Encounter (Signed)
Spoke to patient, advised form will need to be completed by PCP.   He states he does not have a PCP.  Offered resources to help find a new PCP.  He states they are reassigning him to another PCP in the same practice.   Advised to call to discuss with them.  Patient verbalized understanding.

## 2022-09-27 ENCOUNTER — Other Ambulatory Visit: Payer: Self-pay | Admitting: Cardiology

## 2022-09-27 DIAGNOSIS — I4819 Other persistent atrial fibrillation: Secondary | ICD-10-CM

## 2022-09-29 NOTE — Telephone Encounter (Signed)
Refill request for warfarin:  Last INR was 1.7 on 09/24/22 Next INR due 07/17/22 LOV was 12/06/21  Refill approved. Needs 1 year F/U 6/24 with Dr Jens Som.  Message sent to schedulers.

## 2022-09-30 NOTE — Progress Notes (Unsigned)
Assessment/Plan:   1.   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               -Continue carbidopa/levodopa 25/100, 2 tablet at 7 AM/1 at 10 AM/1 PM/4 PM/7 PM.  He sometimes takes one in the middle of the night when wakes up to urinate             -Take carbidopa/levodopa 50/200 CR at bedtime.  Hes been off of the medication for a month but thinks it should be coming today.             -The patient had a DaTscan in February, 2020 and there was near absent dopamine in the bilateral putamen.               -Can consider on/off in the future, but overall patient thinks levodopa is helping.             -Use walker at all times             -Discussed skin biopsies for alpha-synuclein, but he is on Coumadin.  We could do it on that, but risks are greater.  -pt is trying to cash out his life insurance and they require he have the mental capacity to do so.  His form says mental competancy and discussed that this is a legal term and docs don't do competancy exams, but I do think he has the capacity to make the decision to do his finances.  His is fully alert and oriented x 3 today.  He is able to name all meds and give hx accurately.  His form is only good for the month of April if we signed it today, per patient.  I was agreeable to do that.     2.  Cervical dystonia             -Patient wants to hold on Botox.   3.       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  Will recheck with [facility]             -Patient did not find Botox helpful and we discontinued it.             -had surgery on eyes and it didn't help   4.  dysphagia             -feels that swallow is okay right now   5.  Atrial fibrillation             -Following with cardiology.  On diltiazem.   6.  Factor V Leiden             -On Coumadin.   7.  RBD and restless leg             -Clonazepam, 0.5 mg, half tablet at bedtime.                -PDMP is reviewed.  No red flags.  Last filled 08/29/2022             -continue pramipexole, 0.25 mg mg at bedtime.  no evidence of cognitive decline   Subjective:   Stanley Wright was seen today in follow up for Parkinsons disease.  My previous records were reviewed prior to todays visit as well as outside records available to me.  When I saw the patient last visit through video we increased his pramipexole to 0.25 mg at bedtime for restless leg.  He reports that this increase helped.  No hallucinations in day.  He falls about 1 time per month but only 1 time with the walker and the walker was just next to him.  Does rsb 3 days a week.  He denies diplopia.  He asks me to fill out a "competency" form for his life insurance company.  He is trying to cash out his life insurance.  The form is only good for the month.  Current prescribed movement disorder medications: Carbidopa/levodopa 25/100, 1 tablet at 5am (when gets up to use RR)/7 AM/10 AM/1 PM/4 PM/7 PM  Carbidopa/levodopa to 50/200 at bedtime  Clonazepam 0.5 mg, half tablet at bedtime Pramipexole 0.25 mg at bedtime (increased last visit)   PREVIOUS MEDICATIONS: Sinemet and Sinemet CR; Inbrija (patient had trouble using it); clonazepam, 0.5 mg, 1 tablet at bedtime (did not help more than the half tablet)  ALLERGIES:   Allergies  Allergen Reactions   Tylenol [Acetaminophen] Other (See Comments)    Pt states he had a DNA test completed that stated he should never take tylenol.    CURRENT MEDICATIONS:  Outpatient Encounter Medications as of 10/02/2022  Medication Sig   carbidopa-levodopa (SINEMET CR) 50-200 MG tablet TAKE 1 TABLET BY MOUTH AT BEDTIME   carbidopa-levodopa (SINEMET IR) 25-100 MG tablet TAKE 1 TABLET 6 TIMES DAILY   clonazePAM (KLONOPIN) 0.5 MG tablet TAKE 1/2 (ONE-HALF) TABLET BY MOUTH AT BEDTIME   diltiazem (CARDIZEM CD) 360 MG 24 hr capsule Take 1 capsule (360 mg total) by mouth daily.   furosemide (LASIX) 40 MG tablet  Take 1 tablet (40 mg total) by mouth daily.   hydroxychloroquine (PLAQUENIL) 200 MG tablet Take 200 mg by mouth once a week.   pramipexole (MIRAPEX) 0.25 MG tablet Take 1 tablet (0.25 mg total) by mouth at bedtime.   warfarin (COUMADIN) 5 MG tablet TAKE 1 TO 2 TABLETS BY MOUTH ONCE DAILY OR  AS  DIRECTED  BY  COUMADIN  CLINIC   No facility-administered encounter medications on file as of 10/02/2022.    Objective:   PHYSICAL EXAMINATION:    VITALS:   Vitals:   10/02/22 0819  BP: (!) 130/90  Pulse: 61  SpO2: 97%  Weight: 223 lb (101.2 kg)  Height: 5\' 11"  (1.803 m)     GEN:  The patient appears stated age and is in NAD. HEENT:  Normocephalic, atraumatic.  The mucous membranes are moist.  Neck/HEME:  There are no carotid bruits bilaterally.  Chin approximates the chest.  Neck is flexed.  R ear to R shoulder  Neurological examination:  Orientation: The patient was alert and oriented to person, place, time.  He was able to accurately name his medications and the times in which he took them.  He had no trouble naming and repeating objects back.  Recall was good at 3/5 minutes.  He asks appropriate questions and answers my questions appropriately and accurately. Cranial nerves: There is good facial symmetry with facial hypomimia.  R eye is closed today.  He has complete upgaze paresis and mild downgaze paresis.  The speech is fluent and dysarthric and hypophonic. Soft palate rises symmetrically and there is no tongue deviation. Hearing is intact to conversational tone. Sensation: Sensation is intact to light touch throughout Motor: Strength is 5/5 in the upper and lower extremities   Movement examination: Tone: There is  nl tone in the UE Abnormal movements: none Coordination:  There is decremation with RAM's, with any form of RAMS, including alternating supination and pronation of the forearm, hand opening and closing, finger taps, heel taps and toe taps on the L>R Gait and Station:  freezing today into the office that was fairly significant.  He took his medication right when he got into the visit and walked out of the visit markedly better.    I have reviewed and interpreted the following labs independently    Chemistry      Component Value Date/Time   NA 141 11/06/2020 0943   K 4.0 11/06/2020 0943   CL 101 11/06/2020 0943   CO2 24 11/06/2020 0943   BUN 22 11/06/2020 0943   CREATININE 1.06 11/06/2020 0943   CREATININE 0.88 06/12/2015 1505      Component Value Date/Time   CALCIUM 9.0 11/06/2020 0943   ALKPHOS 62 01/17/2019 1321   AST 12 01/17/2019 1321   ALT 6 01/17/2019 1321   BILITOT 0.4 01/17/2019 1321       Lab Results  Component Value Date   WBC 4.3 11/06/2020   HGB 14.9 11/06/2020   HCT 45.0 11/06/2020   MCV 97 11/06/2020   PLT 168 11/06/2020    Lab Results  Component Value Date   TSH 2.88 10/08/2015   Total time spent on today's visit was 35 minutes, including both face-to-face time and nonface-to-face time.  Time included that spent on review of records (prior notes available to me/labs/imaging if pertinent), discussing treatment and goals, answering patient's questions and coordinating care.    Cc:  Medicine, [facility] Internal

## 2022-10-01 DIAGNOSIS — M47812 Spondylosis without myelopathy or radiculopathy, cervical region: Secondary | ICD-10-CM | POA: Diagnosis not present

## 2022-10-01 DIAGNOSIS — G44209 Tension-type headache, unspecified, not intractable: Secondary | ICD-10-CM | POA: Diagnosis not present

## 2022-10-01 DIAGNOSIS — M542 Cervicalgia: Secondary | ICD-10-CM | POA: Diagnosis not present

## 2022-10-01 DIAGNOSIS — M9901 Segmental and somatic dysfunction of cervical region: Secondary | ICD-10-CM | POA: Diagnosis not present

## 2022-10-02 ENCOUNTER — Ambulatory Visit (INDEPENDENT_AMBULATORY_CARE_PROVIDER_SITE_OTHER): Payer: HMO | Admitting: Neurology

## 2022-10-02 ENCOUNTER — Other Ambulatory Visit (INDEPENDENT_AMBULATORY_CARE_PROVIDER_SITE_OTHER): Payer: HMO

## 2022-10-02 ENCOUNTER — Encounter: Payer: Self-pay | Admitting: Neurology

## 2022-10-02 VITALS — BP 130/90 | HR 61 | Ht 71.0 in | Wt 223.0 lb

## 2022-10-02 DIAGNOSIS — G20C Parkinsonism, unspecified: Secondary | ICD-10-CM

## 2022-10-02 DIAGNOSIS — G20A2 Parkinson's disease without dyskinesia, with fluctuations: Secondary | ICD-10-CM

## 2022-10-02 DIAGNOSIS — H02401 Unspecified ptosis of right eyelid: Secondary | ICD-10-CM

## 2022-10-02 MED ORDER — CARBIDOPA-LEVODOPA 25-100 MG PO TABS: ORAL_TABLET | ORAL | 0 refills | Status: DC

## 2022-10-02 NOTE — Patient Instructions (Addendum)
Take carbidopa/levodopa 25/100, 2 tablet at 7 AM/1 at 10 AM/1 PM/4 PM/7 PM.  Stanley Wright can still take the extra in middle of the night when you wake up to pee if needed  Your provider has requested that you have labwork completed today. The lab is located on the Second floor at [address], within the [facility] Endocrinology office. When you get off the elevator, turn right and go in the [facility] Endocrinology [address]; the first brown door on the left.  Tell the ladies behind the desk that you are there for lab work. If you are not called within 15 minutes please check with the front desk.   Once you complete your labs you are free to go. You will receive a call or message via MyChart with your lab results.    Local and Online Resources for Power over Parkinson's Group  April 2024   LOCAL [place] PARKINSON'S GROUPS   Power over Parkinson's Group:    Power Over Parkinson's Patient Education Group will be Wednesday, April 10th-*Hybrid meting*- in person at [facility] location and via [facility], 2:00-3:00 pm.   Power over [employer] and Care Partner Groups will meet together, with plans for separate break out session for caregivers, depending on topic/speaker Upcoming Power over Parkinson's Meetings/Care Partner Support:  2nd Wednesdays of the month at 2 pm:   April 10th, May 8th Contact Amy Marriott at [email or web address] if interested in participating in this group    LOCAL EVENTS AND NEW OFFERINGS  NEW:  Parkinson's Social Game Night.  First Thursday of each month, 2:00-4:00 pm.  *Next date is April 4th*.  Rossie Muskrat [employer], [employer].  Contact [email or web address] if interested. Parkinson's CarePartner Group for Men is in the works, if interested email [email or web address] ACT FITNESS Chair Yoga classes "Train and Gain", Fridays 10 am, [employer].  Contact Gina at [phone].  Health visitor Classes offering  at [employer]!  TUESDAYS (Chair Yoga)  and Wednesdays (PWR! Moves)  1:00 pm.   Contact Synetta Shadow at  [email or web address]  or [phone]  Drumming for Parkinson's will be held on 2nd and 4th Mondays at 11:00 am.   Located at the [place] of the [employer] ([address] [place]  Contact Albertina Parr at [email or web address] or [phone]  Dance for Parkinson 's classes will be on Tuesdays 10-11 am. Located in the [employer], in the first floor of the [employer] ([address] To register:  [email or web address] or [phone] [facility] Parkinson's Tai Chi Class, Mondays at 11 am.  Call [phone] for details Hamil-Kerr Challenge.  Bike, Run, [employer] for [employer].  Saturday, April 6th at [facility].  To register, visit [email or web address] Moving Day [facility].  Saturday, May 4th, 10 am start.  Register at [email or web address]    ONLINE EDUCATION AND SUPPORT  Parkinson Foundation:  www.parkinson.org  PD Health at Home continues:  Mindfulness Mondays, Wellness Wednesdays, Fitness Fridays  (PWR! Moves as part of Fitness Fridays March 22nd, 1-1:45 pm) Upcoming Education:   Parkinson's 101.  Wednesday, April 3rd, 1-2 pm Movement for Parkinson's.  Wednesday, May 1st, 1-2 pm Expert Briefing:  Research Update:  Working to [employer] PD.  Wednesday, April 10th, 1-2 pm Trouble with Zzz's:  Sleep Challenges with Parkinson's.  Wed, May 8th 1-2 pm Register for virtual education and expert briefings (webinars) at [email or web address].gl Please check out their website to sign up for emails and see their  full online offerings     [employer] Foundation:  www.[email or web address]   Third Thursday Webinars:  On the third Thursday of every month at 12 p.m. ET, join our free live webinars to learn about various aspects of living with Parkinson's disease and our  work to speed medical breakthroughs.  Upcoming Webinar:  Let's Talk Taboos:  Hard-to-Discuss Parkinson's Symptoms.  Thursday, April 18th at 12 noon. Check out additional information on their website to see their full online offerings    [place] [facility]:  [email or web address]  Upcoming Webinar:   Emergent Therapies.  Wednesday, April 2nd, 4 pm Series:  Living with Parkinson's Meetup.   Third Thursdays each month, 3 pm  Care Partner Monthly Meetup.  With Jillene Bucks [place].  First Tuesday of each month, 2 pm  Check out additional information to Live Well Today on their website    Parkinson and Movement Disorders (PMD) Alliance:  www.pmdalliance.org  Ne[employer]Life Online:  Online Education Events  Sign up for emails, which are sent weekly to give you updates on programming and online offerings    Parkinson's Association of the [place]:  www.parkinsonassociation.org  Information on online support groups, education events, and online exercises including Yoga, Parkinson's exercises and more-LOTS of information on links to PD resources and online events  Virtual Support Group through Parkinson's Association of the [place]; next one is scheduled for Wednesday, April 3rd  MOVEMENT AND EXERCISE OPPORTUNITIES  PWR! Moves Classes at [facility] Exercise Room.  Wednesdays 10 and 11 am.   Contact Amy Bernie Covey, PT [email or web address] if interested.  Parkinson's Exercise Class offerings at [employer]. *TUESDAYS* (Chair yoga) and Wednesdays (PWR! Moves)  1:00 pm.    Contact Synetta Shadow at [email or web address]    Parkinson's Wellness Recovery (PWR! Moves)  www.[email or web address]  Info on the PWR! Virtual Experience:  You will have access to our expertise?through self-assessment, guided plans that start with the PD-specific fundamentals, educational content, tips, Q&A with an expert, and a growing Engineering geologist of PD-specific pre-recorded and live exercise classes of  varying types and intensity - both physical and cognitive! If that is not enough, we offer 1:1 wellness consultations (in-person or virtual) to personalize your PWR! Dance movement psychotherapist.   Parkinson [employer] Fridays:   As part of the PD Health @ Home program, this free video series focuses each week on one aspect of fitness designed to support people living with Parkinson's.? These weekly videos highlight the Parkinson Foundation fitness guidelines for people with Parkinson's disease.  [email or web address].[email or web address]  Dance for PD website is offering free, live-stream classes throughout the week, as well as links to [employer] of classes:  [email or web address]  Virtual dance and Pilates for Parkinson's classes: Click on the Community Tab> Parkinson's Movement Initiative Tab.  To register for classes and for more information, visit www.[email or web address].co.za and click the "community" tab.   [employer] Parkinson's Cycling Classes   [employer]:  Thursdays @ Noon-Live classes at [employer] ([employer] at [email or web address]?or [phone])  [employer]: Classes Tuesday, Wednesday and Thursday (contact [place] at [email or web address] ?or [phone])  [facility] [employer]  Varied levels of classes are offered Tuesdays and Thursdays at [employer].   Stretching with Byrd Hesselbach weekly class is also offered for people with Parkinson's  To observe a class or for more information, call [phone] or email Patricia Nettle at [email or web address]   ADDITIONAL SUPPORT AND RESOURCES  [employer]:  Online Caregiver Education Opportunities:  www.well-springsolutions.org/caregiver-education/caregiver-support-group.  You may also contact Loleta Chance at [facility] -spring.org or  [phone].     [employer] April [employer] Decisions Day:  The Most Critical  Legal and Medical Decisions to Consider Now!  Tuesday, April 16th, 1-3 pm at [facility].  Contact Loleta Chance at [facility] -spring.org or [phone] Powerful Tools for Caregivers.  6 week educational series for caregivers.  April 18-May 23, 10:30 am-12:15 pm at [facility] 3rd Floor Conference Room.   Contact Loleta Chance at [place] -spring.org or [phone] to register Well-Spring Navigator:  Just1Navigator program, a?free service to help individuals and families through the journey of determining care for older adults.  The "Navigator" is a Child psychotherapist, Sidney Ace, who will speak with a prospective client and/or loved ones to provide an assessment of the situation and a set of recommendations for a personalized care plan -- all free of charge, and whether?Well[employer]Spring [employer] offers the needed service or not. If the need is not a service we provide, we are well-connected with reputable programs in town that we can refer you to.  www.well-springsolutions.org or to speak with the Navigator, call [phone].

## 2022-10-08 DIAGNOSIS — M9901 Segmental and somatic dysfunction of cervical region: Secondary | ICD-10-CM | POA: Diagnosis not present

## 2022-10-08 DIAGNOSIS — M47812 Spondylosis without myelopathy or radiculopathy, cervical region: Secondary | ICD-10-CM | POA: Diagnosis not present

## 2022-10-08 DIAGNOSIS — M542 Cervicalgia: Secondary | ICD-10-CM | POA: Diagnosis not present

## 2022-10-08 DIAGNOSIS — G44209 Tension-type headache, unspecified, not intractable: Secondary | ICD-10-CM | POA: Diagnosis not present

## 2022-10-14 ENCOUNTER — Telehealth: Payer: Self-pay | Admitting: Clinical

## 2022-10-14 NOTE — Telephone Encounter (Signed)
LCSW LVM re June PD Event. Will send email.

## 2022-10-15 ENCOUNTER — Ambulatory Visit: Payer: HMO

## 2022-10-15 DIAGNOSIS — M542 Cervicalgia: Secondary | ICD-10-CM | POA: Diagnosis not present

## 2022-10-15 DIAGNOSIS — M9903 Segmental and somatic dysfunction of lumbar region: Secondary | ICD-10-CM | POA: Diagnosis not present

## 2022-10-15 DIAGNOSIS — M47812 Spondylosis without myelopathy or radiculopathy, cervical region: Secondary | ICD-10-CM | POA: Diagnosis not present

## 2022-10-15 DIAGNOSIS — M9901 Segmental and somatic dysfunction of cervical region: Secondary | ICD-10-CM | POA: Diagnosis not present

## 2022-10-17 LAB — MYASTHENIA GRAVIS PANEL 2
A CHR BINDING ABS: <0.3 nmol/L
ACHR Blocking Abs: <15 % Inhibition (ref <15)
Acetylchol Modul Ab: 4 % Inhibition

## 2022-10-20 DIAGNOSIS — H02834 Dermatochalasis of left upper eyelid: Secondary | ICD-10-CM | POA: Diagnosis not present

## 2022-10-20 DIAGNOSIS — H02831 Dermatochalasis of right upper eyelid: Secondary | ICD-10-CM | POA: Diagnosis not present

## 2022-10-21 NOTE — Progress Notes (Signed)
HPI: FU permanent atrial fibrillation and history of factor V Leiden with 2 previous pulmonary emboli. He is on chronic Coumadin. Abdominal CT 2011 showed no aneurysm. Found to be in atrial fibrillation on previous electrocardiogram. TSH 2.88. Patient placed on Cardizem for rate control. Had successful DCCV 11/22/15. However atrial fibrillation recurred. Nuclear study 2/18 showed no no ischemia or infarction.  Echocardiogram September 2019 showed normal LV function, mild to moderate left atrial enlargement, mild right ventricular enlargement.  Monitor November 2021 showed atrial fibrillation with PVCs or aberrantly conducted beats rate mildly elevated.  Since last seen, patient denies dyspnea, chest pain, palpitations or syncope.  No bleeding.  He has chronic pedal edema.  Current Outpatient Medications  Medication Sig Dispense Refill   carbidopa-levodopa (SINEMET CR) 50-200 MG tablet TAKE 1 TABLET BY MOUTH AT BEDTIME 90 tablet 0   carbidopa-levodopa (SINEMET IR) 25-100 MG tablet 2 tablet at 7 AM/1 at 10 AM/1 PM/4 PM/7 PM.  Bonita Quin can still take the extra in middle of the night when you wake up to pee if needed 630 tablet 0   clonazePAM (KLONOPIN) 0.5 MG tablet TAKE 1/2 (ONE-HALF) TABLET BY MOUTH AT BEDTIME 15 tablet 1   diltiazem (CARDIZEM CD) 360 MG 24 hr capsule Take 1 capsule (360 mg total) by mouth daily. 90 capsule 1   furosemide (LASIX) 40 MG tablet Take 1 tablet (40 mg total) by mouth daily. 90 tablet 3   hydroxychloroquine (PLAQUENIL) 200 MG tablet Take 200 mg by mouth once a week.     pramipexole (MIRAPEX) 0.25 MG tablet Take 1 tablet (0.25 mg total) by mouth at bedtime. 90 tablet 1   warfarin (COUMADIN) 5 MG tablet TAKE 1 TO 2 TABLETS BY MOUTH ONCE DAILY OR  AS  DIRECTED  BY  COUMADIN  CLINIC 120 tablet 1   No current facility-administered medications for this visit.     Past Medical History:  Diagnosis Date   Arthritis    BENIGN PROSTATIC HYPERTROPHY, WITH OBSTRUCTION    Cancer  (HCC)    skin, basal, squamous   COLONIC POLYPS    Diverticulitis    DVT (deep venous thrombosis) (HCC) 2000   Dysrhythmia    Hx Afib- 2017   Early cataract    Factor 5 Leiden mutation, heterozygous (HCC)    GERD (gastroesophageal reflux disease)    not on medication   Hearing aid worn    B/L   HIATAL HERNIA    ITP (idiopathic thrombocytopenic purpura) 2003   Long term current use of anticoagulant    Osteoarthritis    right foot   Parkinson's disease 02/03/2018   PSORIASIS    Pulmonary embolus (HCC) 2000   Shortness of breath dyspnea    at times - Talking alot   Sleep apnea    Wears glasses     Past Surgical History:  Procedure Laterality Date   ANKLE FUSION Right 08/24/2019   Procedure: RIGHT TALO-NAVICULAR FUSION;  Surgeon: Nadara Mustard, MD;  Location: [facility] OR;  Service: Orthopedics;  Laterality: Right;   CARDIOVERSION N/A 11/22/2015   Procedure: CARDIOVERSION;  Surgeon: Thurmon Fair, MD;  Location: [facility] ENDOSCOPY;  Service: Cardiovascular;  Laterality: N/A;   COLONOSCOPY     HERNIA REPAIR Left    Inguinal- x2 . mesh x1   INSERTION OF MESH N/A 02/25/2016   Procedure: INSERTION OF MESH;  Surgeon: Emelia Loron, MD;  Location: [facility] OR;  Service: General;  Laterality: N/A;   LUNG REMOVAL, PARTIAL Right  2004   was fungal and not cancerous   PROSTATE SURGERY     UMBILICAL HERNIA REPAIR N/A 02/25/2016   Procedure: LAPAROSCOPIC UMBILICAL HERNIA REPAIR;  Surgeon: Emelia Loron, MD;  Location: [facility] OR;  Service: General;  Laterality: N/A;    Social History   Socioeconomic History   Marital status: Divorced    Spouse name: Not on file   Number of children: 2   Years of education: Masters   Highest education level: Not on file  Occupational History    Employer: RETIRED    Comment: Emergency planning/management officer  Tobacco Use   Smoking status: Former    Packs/day: 1    Types: Cigarettes    Quit date: 06/16/1985    Years since quitting: 37.3   Smokeless tobacco: Never   Tobacco  comments:    quit approx age 37-   Vaping Use   Vaping Use: Never used  Substance and Sexual Activity   Alcohol use: No   Drug use: No   Sexual activity: Yes  Other Topics Concern   Not on file  Social History Narrative   Lives alone   Caffeine use: Coffee daily   Right handed    Social Determinants of Health   Financial Resource Strain: Not on file  Food Insecurity: No Food Insecurity (03/13/2022)   Hunger Vital Sign    Worried About Running Out of Food in the Last Year: Never true    Ran Out of Food in the Last Year: Never true  Transportation Needs: Not on file  Physical Activity: Not on file  Stress: Not on file  Social Connections: Not on file  Intimate Partner Violence: Not on file    Family History  Problem Relation Age of Onset   Cancer Father    Breast cancer Sister    Heart disease Brother    Cancer Maternal Grandmother    Stroke Paternal Grandfather     ROS: no fevers or chills, productive cough, hemoptysis, dysphasia, odynophagia, melena, hematochezia, dysuria, hematuria, rash, seizure activity, orthopnea, PND, pedal edema, claudication. Remaining systems are negative.  Physical Exam: Well-developed well-nourished in no acute distress.  Skin is warm and dry.  HEENT is normal.  Neck is supple.  Chest is clear to auscultation with normal expansion.  Cardiovascular exam is irregular Abdominal exam nontender or distended. No masses palpated. Extremities show 1+ edema. neuro grossly intact  ECG-atrial fibrillation with PVCs or aberrantly conducted beats, rate 90, cannot rule out anterior infarct or inferior infarct.  Personally reviewed  A/P  1 permanent atrial fibrillation-continue Cardizem at present dose.  Continue Coumadin with goal INR 2-3.  Check hemoglobin.  Repeat echocardiogram.  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  Continue Coumadin.  3 chronic pedal edema-Continue diuretic at present dose.  Check potassium  and renal function.  4 history of Parkinson's-patient states he occasionally falls if he is not using his walker.  I have asked him to be diligent about using this.  I am hesitant to discontinue anticoagulation given history of factor V Leiden with recurrent pulmonary emboli as well as permanent atrial fibrillation.  Olga Millers, MD

## 2022-10-22 ENCOUNTER — Ambulatory Visit: Payer: HMO | Attending: Cardiology | Admitting: *Deleted

## 2022-10-22 DIAGNOSIS — I4819 Other persistent atrial fibrillation: Secondary | ICD-10-CM

## 2022-10-22 DIAGNOSIS — Z5181 Encounter for therapeutic drug level monitoring: Secondary | ICD-10-CM

## 2022-10-22 DIAGNOSIS — D6851 Activated protein C resistance: Secondary | ICD-10-CM | POA: Diagnosis not present

## 2022-10-22 DIAGNOSIS — M9903 Segmental and somatic dysfunction of lumbar region: Secondary | ICD-10-CM | POA: Diagnosis not present

## 2022-10-22 DIAGNOSIS — M47812 Spondylosis without myelopathy or radiculopathy, cervical region: Secondary | ICD-10-CM | POA: Diagnosis not present

## 2022-10-22 DIAGNOSIS — M542 Cervicalgia: Secondary | ICD-10-CM | POA: Diagnosis not present

## 2022-10-22 DIAGNOSIS — M9901 Segmental and somatic dysfunction of cervical region: Secondary | ICD-10-CM | POA: Diagnosis not present

## 2022-10-22 LAB — POCT INR: INR: 1.3 — AB (ref 2.0–3.0)

## 2022-10-22 NOTE — Patient Instructions (Signed)
Description   Take another 1/2 tablet today and tomorrow take 1.5 tablets of warfarin then continue taking 1 tablet (5mg ) daily EXCEPT 1.5 tablets on Sundays, Wednesdays, and Fridays. Recheck INR in 1 week.  Coumadin Clinic [phone]

## 2022-10-29 DIAGNOSIS — M9901 Segmental and somatic dysfunction of cervical region: Secondary | ICD-10-CM | POA: Diagnosis not present

## 2022-10-29 DIAGNOSIS — M47812 Spondylosis without myelopathy or radiculopathy, cervical region: Secondary | ICD-10-CM | POA: Diagnosis not present

## 2022-10-29 DIAGNOSIS — M9903 Segmental and somatic dysfunction of lumbar region: Secondary | ICD-10-CM | POA: Diagnosis not present

## 2022-10-29 DIAGNOSIS — M542 Cervicalgia: Secondary | ICD-10-CM | POA: Diagnosis not present

## 2022-10-30 ENCOUNTER — Ambulatory Visit: Payer: HMO | Attending: Cardiology | Admitting: Cardiology

## 2022-10-30 ENCOUNTER — Encounter: Payer: Self-pay | Admitting: Cardiology

## 2022-10-30 ENCOUNTER — Ambulatory Visit (INDEPENDENT_AMBULATORY_CARE_PROVIDER_SITE_OTHER): Payer: HMO | Admitting: *Deleted

## 2022-10-30 VITALS — BP 110/70 | HR 90 | Ht 71.0 in | Wt 240.2 lb

## 2022-10-30 DIAGNOSIS — Z5181 Encounter for therapeutic drug level monitoring: Secondary | ICD-10-CM | POA: Diagnosis not present

## 2022-10-30 DIAGNOSIS — R600 Localized edema: Secondary | ICD-10-CM | POA: Diagnosis not present

## 2022-10-30 DIAGNOSIS — I4821 Permanent atrial fibrillation: Secondary | ICD-10-CM | POA: Diagnosis not present

## 2022-10-30 DIAGNOSIS — I4819 Other persistent atrial fibrillation: Secondary | ICD-10-CM | POA: Diagnosis not present

## 2022-10-30 DIAGNOSIS — D6851 Activated protein C resistance: Secondary | ICD-10-CM

## 2022-10-30 LAB — POCT INR: INR: 2.8 (ref 2.0–3.0)

## 2022-10-30 NOTE — Patient Instructions (Signed)
Description   Continue taking 1 tablet (5mg ) daily EXCEPT 1.5 tablets on Sundays, Wednesdays, and Fridays. Recheck INR in 3 weeks.  Coumadin Clinic [phone]

## 2022-10-30 NOTE — Patient Instructions (Signed)
  Testing/Procedures:  Your physician has requested that you have an echocardiogram. Echocardiography is a painless test that uses sound waves to create images of your heart. It provides your doctor with information about the size and shape of your heart and how well your heart's chambers and valves are working. This procedure takes approximately one hour. There are no restrictions for this procedure. Please do NOT wear cologne, perfume, aftershave, or lotions (deodorant is allowed). Please arrive 15 minutes prior to your appointment time. [address]   Follow-Up: At [facility], you and your health needs are our priority.  As part of our continuing mission to provide you with exceptional heart care, we have created designated Provider Care Teams.  These Care Teams include your primary Cardiologist (physician) and Advanced Practice Providers (APPs -  Physician Assistants and Nurse Practitioners) who all work together to provide you with the care you need, when you need it.  We recommend signing up for the patient portal called "MyChart".  Sign up information is provided on this After Visit Summary.  MyChart is used to connect with patients for Virtual Visits (Telemedicine).  Patients are able to view lab/test results, encounter notes, upcoming appointments, etc.  Non-urgent messages can be sent to your provider as well.   To learn more about what you can do with MyChart, go to [email or web address].    Your next appointment:   12 month(s)  Provider:   Brian Crenshaw, MD      

## 2022-10-31 ENCOUNTER — Encounter: Payer: Self-pay | Admitting: *Deleted

## 2022-10-31 LAB — CBC
Hematocrit: 42.2 % (ref 37.5–51.0)
Hemoglobin: 14.1 g/dL (ref 13.0–17.7)
MCH: 32.1 pg (ref 26.6–33.0)
MCHC: 33.4 g/dL (ref 31.5–35.7)
MCV: 96 fL (ref 79–97)
Platelets: 158 10*3/uL (ref 150–450)
RBC: 4.39 x10E6/uL (ref 4.14–5.80)
RDW: 12.7 % (ref 11.6–15.4)
WBC: 4.5 10*3/uL (ref 3.4–10.8)

## 2022-10-31 LAB — BASIC METABOLIC PANEL
BUN/Creatinine Ratio: 19 (ref 10–24)
BUN: 17 mg/dL (ref 8–27)
CO2: 27 mmol/L (ref 20–29)
Calcium: 8.7 mg/dL (ref 8.6–10.2)
Chloride: 101 mmol/L (ref 96–106)
Creatinine, Ser: 0.9 mg/dL (ref 0.76–1.27)
Glucose: 99 mg/dL (ref 70–99)
Potassium: 4.4 mmol/L (ref 3.5–5.2)
Sodium: 139 mmol/L (ref 134–144)
eGFR: 88 mL/min/{1.73_m2} (ref >59)

## 2022-11-03 ENCOUNTER — Other Ambulatory Visit: Payer: Self-pay | Admitting: Neurology

## 2022-11-03 DIAGNOSIS — G20C Parkinsonism, unspecified: Secondary | ICD-10-CM

## 2022-11-05 DIAGNOSIS — M9901 Segmental and somatic dysfunction of cervical region: Secondary | ICD-10-CM | POA: Diagnosis not present

## 2022-11-05 DIAGNOSIS — M47812 Spondylosis without myelopathy or radiculopathy, cervical region: Secondary | ICD-10-CM | POA: Diagnosis not present

## 2022-11-05 DIAGNOSIS — M542 Cervicalgia: Secondary | ICD-10-CM | POA: Diagnosis not present

## 2022-11-05 DIAGNOSIS — M9903 Segmental and somatic dysfunction of lumbar region: Secondary | ICD-10-CM | POA: Diagnosis not present

## 2022-11-10 ENCOUNTER — Other Ambulatory Visit: Payer: Self-pay | Admitting: Neurology

## 2022-11-10 DIAGNOSIS — G20C Parkinsonism, unspecified: Secondary | ICD-10-CM

## 2022-11-12 ENCOUNTER — Other Ambulatory Visit: Payer: Self-pay | Admitting: Neurology

## 2022-11-12 DIAGNOSIS — M542 Cervicalgia: Secondary | ICD-10-CM | POA: Diagnosis not present

## 2022-11-12 DIAGNOSIS — M9903 Segmental and somatic dysfunction of lumbar region: Secondary | ICD-10-CM | POA: Diagnosis not present

## 2022-11-12 DIAGNOSIS — G20C Parkinsonism, unspecified: Secondary | ICD-10-CM

## 2022-11-12 DIAGNOSIS — M9901 Segmental and somatic dysfunction of cervical region: Secondary | ICD-10-CM | POA: Diagnosis not present

## 2022-11-12 DIAGNOSIS — M47812 Spondylosis without myelopathy or radiculopathy, cervical region: Secondary | ICD-10-CM | POA: Diagnosis not present

## 2022-11-19 DIAGNOSIS — M9903 Segmental and somatic dysfunction of lumbar region: Secondary | ICD-10-CM | POA: Diagnosis not present

## 2022-11-19 DIAGNOSIS — M542 Cervicalgia: Secondary | ICD-10-CM | POA: Diagnosis not present

## 2022-11-19 DIAGNOSIS — M9901 Segmental and somatic dysfunction of cervical region: Secondary | ICD-10-CM | POA: Diagnosis not present

## 2022-11-19 DIAGNOSIS — M47812 Spondylosis without myelopathy or radiculopathy, cervical region: Secondary | ICD-10-CM | POA: Diagnosis not present

## 2022-11-21 ENCOUNTER — Ambulatory Visit: Payer: HMO | Attending: Internal Medicine

## 2022-11-21 DIAGNOSIS — D6851 Activated protein C resistance: Secondary | ICD-10-CM | POA: Diagnosis not present

## 2022-11-21 DIAGNOSIS — I4819 Other persistent atrial fibrillation: Secondary | ICD-10-CM

## 2022-11-21 DIAGNOSIS — Z5181 Encounter for therapeutic drug level monitoring: Secondary | ICD-10-CM | POA: Diagnosis not present

## 2022-11-21 LAB — POCT INR: INR: 2.7 (ref 2.0–3.0)

## 2022-11-21 NOTE — Patient Instructions (Signed)
Continue taking 1 tablet (5mg ) daily EXCEPT 1.5 tablets on Sundays, Wednesdays, and Fridays. Recheck INR in 5 weeks.  Coumadin Clinic [phone]

## 2022-11-26 DIAGNOSIS — M9903 Segmental and somatic dysfunction of lumbar region: Secondary | ICD-10-CM | POA: Diagnosis not present

## 2022-11-26 DIAGNOSIS — M542 Cervicalgia: Secondary | ICD-10-CM | POA: Diagnosis not present

## 2022-11-26 DIAGNOSIS — M9901 Segmental and somatic dysfunction of cervical region: Secondary | ICD-10-CM | POA: Diagnosis not present

## 2022-11-26 DIAGNOSIS — M47812 Spondylosis without myelopathy or radiculopathy, cervical region: Secondary | ICD-10-CM | POA: Diagnosis not present

## 2022-12-02 ENCOUNTER — Ambulatory Visit (HOSPITAL_COMMUNITY): Payer: HMO | Attending: Cardiology

## 2022-12-02 DIAGNOSIS — I4821 Permanent atrial fibrillation: Secondary | ICD-10-CM | POA: Diagnosis not present

## 2022-12-02 LAB — ECHOCARDIOGRAM COMPLETE
Calc EF: 46.4 %
S' Lateral: 3.9 cm
Single Plane A2C EF: 49.1 %
Single Plane A4C EF: 42.5 %

## 2022-12-03 DIAGNOSIS — M9901 Segmental and somatic dysfunction of cervical region: Secondary | ICD-10-CM | POA: Diagnosis not present

## 2022-12-03 DIAGNOSIS — M9903 Segmental and somatic dysfunction of lumbar region: Secondary | ICD-10-CM | POA: Diagnosis not present

## 2022-12-03 DIAGNOSIS — M47812 Spondylosis without myelopathy or radiculopathy, cervical region: Secondary | ICD-10-CM | POA: Diagnosis not present

## 2022-12-03 DIAGNOSIS — M542 Cervicalgia: Secondary | ICD-10-CM | POA: Diagnosis not present

## 2022-12-05 ENCOUNTER — Telehealth: Payer: Self-pay | Admitting: Cardiology

## 2022-12-05 NOTE — Telephone Encounter (Signed)
Spoke with pt, Follow up scheduled  

## 2022-12-05 NOTE — Telephone Encounter (Signed)
Follow Up:    Patient says he is he is returning Debra's from yesterday.

## 2022-12-05 NOTE — Telephone Encounter (Signed)
Spoke with patient regarding results.  Unsure when he needs F/U as OV stated 1 year, but not sure if needed sooner from echo results.

## 2022-12-10 DIAGNOSIS — M9903 Segmental and somatic dysfunction of lumbar region: Secondary | ICD-10-CM | POA: Diagnosis not present

## 2022-12-10 DIAGNOSIS — M542 Cervicalgia: Secondary | ICD-10-CM | POA: Diagnosis not present

## 2022-12-10 DIAGNOSIS — M47812 Spondylosis without myelopathy or radiculopathy, cervical region: Secondary | ICD-10-CM | POA: Diagnosis not present

## 2022-12-10 DIAGNOSIS — M9901 Segmental and somatic dysfunction of cervical region: Secondary | ICD-10-CM | POA: Diagnosis not present

## 2022-12-15 ENCOUNTER — Other Ambulatory Visit: Payer: Self-pay | Admitting: Neurology

## 2022-12-15 DIAGNOSIS — G2581 Restless legs syndrome: Secondary | ICD-10-CM

## 2022-12-15 DIAGNOSIS — G232 Striatonigral degeneration: Secondary | ICD-10-CM

## 2022-12-15 DIAGNOSIS — G20C Parkinsonism, unspecified: Secondary | ICD-10-CM

## 2022-12-15 MED ORDER — PRAMIPEXOLE DIHYDROCHLORIDE 0.25 MG PO TABS
0.2500 mg | ORAL_TABLET | Freq: Every day | ORAL | 0 refills | Status: DC
Start: 2022-12-15 — End: 2023-03-03

## 2022-12-17 DIAGNOSIS — M47812 Spondylosis without myelopathy or radiculopathy, cervical region: Secondary | ICD-10-CM | POA: Diagnosis not present

## 2022-12-17 DIAGNOSIS — M542 Cervicalgia: Secondary | ICD-10-CM | POA: Diagnosis not present

## 2022-12-17 DIAGNOSIS — M9901 Segmental and somatic dysfunction of cervical region: Secondary | ICD-10-CM | POA: Diagnosis not present

## 2022-12-17 DIAGNOSIS — M9903 Segmental and somatic dysfunction of lumbar region: Secondary | ICD-10-CM | POA: Diagnosis not present

## 2022-12-24 DIAGNOSIS — M542 Cervicalgia: Secondary | ICD-10-CM | POA: Diagnosis not present

## 2022-12-24 DIAGNOSIS — M47812 Spondylosis without myelopathy or radiculopathy, cervical region: Secondary | ICD-10-CM | POA: Diagnosis not present

## 2022-12-24 DIAGNOSIS — M9903 Segmental and somatic dysfunction of lumbar region: Secondary | ICD-10-CM | POA: Diagnosis not present

## 2022-12-24 DIAGNOSIS — M9901 Segmental and somatic dysfunction of cervical region: Secondary | ICD-10-CM | POA: Diagnosis not present

## 2022-12-26 ENCOUNTER — Ambulatory Visit: Payer: HMO

## 2022-12-26 ENCOUNTER — Telehealth: Payer: Self-pay

## 2022-12-26 NOTE — Telephone Encounter (Signed)
Pt missed Coumadin Clinic appt. Called to reschedule, no answer. Left message on voicemail to call back.

## 2022-12-29 NOTE — Progress Notes (Signed)
HPI: FU permanent atrial fibrillation and history of factor V Leiden with 2 previous pulmonary emboli. He is on chronic Coumadin. Abdominal CT 2011 showed no aneurysm. Found to be in atrial fibrillation on previous electrocardiogram. TSH 2.88. Patient placed on Cardizem for rate control. Had successful DCCV 11/22/15. However atrial fibrillation recurred. Nuclear study 2/18 showed no no ischemia or infarction. Monitor November 2021 showed atrial fibrillation with PVCs or aberrantly conducted beats rate mildly elevated.  Echo repeated June 2024 and showed ejection fraction 45 to 50%, mild RV dysfunction, mild left atrial enlargement and mildly dilated aortic root at 40 mm.  Since last seen, patient denies dyspnea, chest pain, palpitations or syncope.  He has chronic ankle edema.  No bleeding.  Current Outpatient Medications  Medication Sig Dispense Refill   carbidopa-levodopa (SINEMET CR) 50-200 MG tablet TAKE 1 TABLET BY MOUTH AT BEDTIME 90 tablet 0   carbidopa-levodopa (SINEMET IR) 25-100 MG tablet TAKE 1 TABLET BY MOUTH SIX TIMES DAILY 540 tablet 0   clonazePAM (KLONOPIN) 0.5 MG tablet TAKE 1/2 (ONE-HALF) TABLET BY MOUTH AT BEDTIME 15 tablet 5   diltiazem (CARDIZEM CD) 360 MG 24 hr capsule Take 1 capsule (360 mg total) by mouth daily. 90 capsule 1   furosemide (LASIX) 40 MG tablet Take 1 tablet (40 mg total) by mouth daily. 90 tablet 3   hydroxychloroquine (PLAQUENIL) 200 MG tablet Take 200 mg by mouth once a week.     pramipexole (MIRAPEX) 0.25 MG tablet Take 1 tablet (0.25 mg total) by mouth at bedtime. 90 tablet 0   warfarin (COUMADIN) 5 MG tablet TAKE 1 TO 2 TABLETS BY MOUTH ONCE DAILY OR  AS  DIRECTED  BY  COUMADIN  CLINIC 120 tablet 1   No current facility-administered medications for this visit.     Past Medical History:  Diagnosis Date   Arthritis    BENIGN PROSTATIC HYPERTROPHY, WITH OBSTRUCTION    Cancer (HCC)    skin, basal, squamous   COLONIC POLYPS    Diverticulitis     DVT (deep venous thrombosis) (HCC) 2000   Dysrhythmia    Hx Afib- 2017   Early cataract    Factor 5 Leiden mutation, heterozygous (HCC)    GERD (gastroesophageal reflux disease)    not on medication   Hearing aid worn    B/L   HIATAL HERNIA    ITP (idiopathic thrombocytopenic purpura) 2003   Long term current use of anticoagulant    Osteoarthritis    right foot   Parkinson's disease 02/03/2018   PSORIASIS    Pulmonary embolus (HCC) 2000   Shortness of breath dyspnea    at times - Talking alot   Sleep apnea    Wears glasses     Past Surgical History:  Procedure Laterality Date   ANKLE FUSION Right 08/24/2019   Procedure: RIGHT TALO-NAVICULAR FUSION;  Surgeon: Nadara Mustard, MD;  Location: [facility] OR;  Service: Orthopedics;  Laterality: Right;   CARDIOVERSION N/A 11/22/2015   Procedure: CARDIOVERSION;  Surgeon: Thurmon Fair, MD;  Location: [facility] ENDOSCOPY;  Service: Cardiovascular;  Laterality: N/A;   COLONOSCOPY     HERNIA REPAIR Left    Inguinal- x2 . mesh x1   INSERTION OF MESH N/A 02/25/2016   Procedure: INSERTION OF MESH;  Surgeon: Emelia Loron, MD;  Location: [facility] OR;  Service: General;  Laterality: N/A;   LUNG REMOVAL, PARTIAL Right 2004   was fungal and not cancerous   PROSTATE SURGERY  UMBILICAL HERNIA REPAIR N/A 02/25/2016   Procedure: LAPAROSCOPIC UMBILICAL HERNIA REPAIR;  Surgeon: Emelia Loron, MD;  Location: [facility] OR;  Service: General;  Laterality: N/A;    Social History   Socioeconomic History   Marital status: Divorced    Spouse name: Not on file   Number of children: 2   Years of education: Masters   Highest education level: Not on file  Occupational History    Employer: RETIRED    Comment: Emergency planning/management officer  Tobacco Use   Smoking status: Former    Current packs/day: 0.00    Types: Cigarettes    Quit date: 06/16/1985    Years since quitting: 37.5   Smokeless tobacco: Never   Tobacco comments:    quit approx age 39-   Vaping Use   Vaping status:  Never Used  Substance and Sexual Activity   Alcohol use: No   Drug use: No   Sexual activity: Yes  Other Topics Concern   Not on file  Social History Narrative   Lives alone   Caffeine use: Coffee daily   Right handed    Social Determinants of Health   Financial Resource Strain: Not on file  Food Insecurity: No Food Insecurity (03/13/2022)   Hunger Vital Sign    Worried About Running Out of Food in the Last Year: Never true    Ran Out of Food in the Last Year: Never true  Transportation Needs: Not on file  Physical Activity: Not on file  Stress: Not on file  Social Connections: Not on file  Intimate Partner Violence: Not on file    Family History  Problem Relation Age of Onset   Cancer Father    Breast cancer Sister    Heart disease Brother    Cancer Maternal Grandmother    Stroke Paternal Grandfather     ROS: no fevers or chills, productive cough, hemoptysis, dysphasia, odynophagia, melena, hematochezia, dysuria, hematuria, rash, seizure activity, orthopnea, PND, pedal edema, claudication. Remaining systems are negative.  Physical Exam: Well-developed well-nourished in no acute distress.  Skin is warm and dry.  HEENT is normal.  Neck is supple.  Chest is clear to auscultation with normal expansion.  Cardiovascular exam is iregular Abdominal exam nontender or distended. No masses palpated. Extremities show 2+ ankle edema. neuro grossly intact  A/P  1 permanent atrial fibrillation-will continue Cardizem for rate control.  Continue Coumadin with goal INR 2-3.  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  3 mild LV dysfunction-most recent echocardiogram shows mild LV dysfunction which is new compared to previous.  Etiology unclear.  Question tachycardia mediated with atrial fibrillation.  Will place 3-day Zio patch to further assess rate control.  If necessary we will increase AV nodal blocking agents.  May  also need to consider changing Cardizem to Toprol but will decide after Zio patch.  Hopefully if there is a component of tachycardia mediated cardiomyopathy LV function would improve with improved rate control.  4 chronic pedal edema-will increase Lasix to 60 mg daily.  Check potassium and renal function in 1 week.  5 history of Parkinson's-patient is unsteady on his feet.  We again discussed the importance of being diligent using his walker.  Given his history of recurrent pulmonary emboli/DVT and permanent atrial fibrillation I am hesitant to discontinue anticoagulation.  Olga Millers, MD

## 2023-01-01 ENCOUNTER — Ambulatory Visit: Payer: HMO | Attending: Internal Medicine

## 2023-01-01 ENCOUNTER — Telehealth: Payer: Self-pay | Admitting: Cardiology

## 2023-01-01 DIAGNOSIS — I4819 Other persistent atrial fibrillation: Secondary | ICD-10-CM

## 2023-01-01 DIAGNOSIS — Z5181 Encounter for therapeutic drug level monitoring: Secondary | ICD-10-CM

## 2023-01-01 LAB — POCT INR: INR: 4 — AB (ref 2.0–3.0)

## 2023-01-01 NOTE — Patient Instructions (Signed)
Description   HOLD today's dose and then continue taking 1 tablet (5mg ) daily EXCEPT 1.5 tablets on Sundays, Wednesdays, and Fridays.  Recheck INR in 3 weeks.  Coumadin Clinic [phone]

## 2023-01-01 NOTE — Telephone Encounter (Signed)
Patient was brought upstairs on wheelchair by two people due to him struggling to walk. Me and Memory Dance took him to his car afterwards 01/01/2023.

## 2023-01-05 ENCOUNTER — Ambulatory Visit: Payer: HMO | Attending: Cardiology | Admitting: Cardiology

## 2023-01-05 ENCOUNTER — Ambulatory Visit: Payer: HMO | Attending: Cardiology

## 2023-01-05 ENCOUNTER — Encounter: Payer: Self-pay | Admitting: Cardiology

## 2023-01-05 VITALS — BP 102/64 | HR 97 | Ht 72.0 in | Wt 233.0 lb

## 2023-01-05 DIAGNOSIS — I4821 Permanent atrial fibrillation: Secondary | ICD-10-CM

## 2023-01-05 DIAGNOSIS — I5032 Chronic diastolic (congestive) heart failure: Secondary | ICD-10-CM | POA: Diagnosis not present

## 2023-01-05 MED ORDER — FUROSEMIDE 40 MG PO TABS
40.0000 mg | ORAL_TABLET | Freq: Every day | ORAL | Status: DC
Start: 2023-01-05 — End: 2023-02-04

## 2023-01-05 NOTE — Patient Instructions (Signed)
Medication Instructions:   INCREASE FUROSEMIDE TO 60 MG ONCE DAILY= 1 AND 1/2 OF THE 40 MG TABLET ONCE DAILY  *If you need a refill on your cardiac medications before your next appointment, please call your pharmacy*   Lab Work:  Your physician recommends that you return for lab work in: ONE WEEK-DO NOT NEED TO FAST  If you have labs (blood work) drawn today and your tests are completely normal, you will receive your results only by: MyChart Message (if you have MyChart) OR A paper copy in the mail If you have any lab test that is abnormal or we need to change your treatment, we will call you to review the results.   Testing/Procedures:  ZIO AT Long term monitor-Live Telemetry  Your physician has requested you wear a ZIO patch monitor for 3 days.  This is a single patch monitor. [employer] supplies one patch monitor per enrollment. Additional  stickers are not available.  Please do not apply patch if you will be having a Nuclear Stress Test, Echocardiogram, Cardiac CT, MRI,  or Chest Xray during the period you would be wearing the monitor. The patch cannot be worn during  these tests. You cannot remove and re-apply the ZIO AT patch monitor.  Your ZIO patch monitor will be mailed 3 day USPS to your address on file. It may take 3-5 days to  receive your monitor after you have been enrolled.  Once you have received your monitor, please review the enclosed instructions. Your monitor has  already been registered assigning a specific monitor serial # to you.   Billing and Patient Assistance Program information  Meredeth Ide has been supplied with any insurance information on record for billing. [employer] offers a sliding scale Patient Assistance Program for patients without insurance, or whose  insurance does not completely cover the cost of the ZIO patch monitor. You must apply for the  Patient Assistance Program to qualify for the discounted rate. To apply, call [employer] at [phone],   select option 4, select option 2 , ask to apply for the Patient Assistance Program, (you can request an  interpreter if needed). [employer] will ask your household income and how many people are in your  household. [employer] will quote your out-of-pocket cost based on this information. They will also be able  to set up a 12 month interest free payment plan if needed.  Applying the monitor   Shave hair from upper left chest.  Hold the abrader disc by orange tab. Rub the abrader in 40 strokes over left upper chest as indicated in  your monitor instructions.  Clean area with 4 enclosed alcohol pads. Use all pads to ensure the area is cleaned thoroughly. Let  dry.  Apply patch as indicated in monitor instructions. Patch will be placed under collarbone on left side of  chest with arrow pointing upward.  Rub patch adhesive wings for 2 minutes. Remove the white label marked "1". Remove the white label  marked "2". Rub patch adhesive wings for 2 additional minutes.  While looking in a mirror, press and release button in center of patch. A small green light will flash 3-4  times. This will be your only indicator that the monitor has been turned on.  Do not shower for the first 24 hours. You may shower after the first 24 hours.  Press the button if you feel a symptom. You will hear a small click. Record Date, Time and Symptom in  the Patient Log.  Starting the Gateway  In your kit there is a Audiological scientist box the size of a cellphone. This is Buyer, retail. It transmits all your  recorded data to [facility]. This box must always stay within 10 feet of you. Open the box and push the *  button. There will be a light that blinks orange and then green a few times. When the light stops  blinking, the Gateway is connected to the ZIO patch. Call [employer] at [phone] to confirm your monitor is transmitting.  Returning your monitor  Remove your patch and place it inside the Gateway. In the lower half of the  Gateway there is a white  bag with prepaid postage on it. Place Gateway in bag and seal. Mail package back to [place] as soon as  possible. Your physician should have your final report approximately 7 days after you have mailed back  your monitor. Call [facility] Customer Care at [phone] if you have questions regarding your ZIO AT  patch monitor. Call them immediately if you see an orange light blinking on your monitor.  If your monitor falls off in less than 4 days, contact our Monitor department at [phone]. If your  monitor becomes loose or falls off after 4 days call [employer] at [phone] for suggestions on  securing your monitor    Follow-Up: At [facility], you and your health needs are our priority.  As part of our continuing mission to provide you with exceptional heart care, we have created designated Provider Care Teams.  These Care Teams include your primary Cardiologist (physician) and Advanced Practice Providers (APPs -  Physician Assistants and Nurse Practitioners) who all work together to provide you with the care you need, when you need it.  We recommend signing up for the patient portal called "MyChart".  Sign up information is provided on this After Visit Summary.  MyChart is used to connect with patients for Virtual Visits (Telemedicine).  Patients are able to view lab/test results, encounter notes, upcoming appointments, etc.  Non-urgent messages can be sent to your provider as well.   To learn more about what you can do with MyChart, go to [email or web address].    Your next appointment:   8 week(s)  Provider:   ANY APP  Your physician wants you to follow-up in: 6 MONTHS WITH DR Jens Som You will receive a reminder letter in the mail two months in advance. If you don't receive a letter, please call our office to schedule the follow-up appointment.

## 2023-01-05 NOTE — Progress Notes (Unsigned)
Enrolled for [employer] to mail a ZIO XT long term holter monitor to the patients address on file.  

## 2023-01-07 DIAGNOSIS — M542 Cervicalgia: Secondary | ICD-10-CM | POA: Diagnosis not present

## 2023-01-07 DIAGNOSIS — M47812 Spondylosis without myelopathy or radiculopathy, cervical region: Secondary | ICD-10-CM | POA: Diagnosis not present

## 2023-01-07 DIAGNOSIS — M9903 Segmental and somatic dysfunction of lumbar region: Secondary | ICD-10-CM | POA: Diagnosis not present

## 2023-01-07 DIAGNOSIS — M9901 Segmental and somatic dysfunction of cervical region: Secondary | ICD-10-CM | POA: Diagnosis not present

## 2023-01-08 DIAGNOSIS — I4821 Permanent atrial fibrillation: Secondary | ICD-10-CM

## 2023-01-14 DIAGNOSIS — M47812 Spondylosis without myelopathy or radiculopathy, cervical region: Secondary | ICD-10-CM | POA: Diagnosis not present

## 2023-01-14 DIAGNOSIS — M9903 Segmental and somatic dysfunction of lumbar region: Secondary | ICD-10-CM | POA: Diagnosis not present

## 2023-01-14 DIAGNOSIS — M542 Cervicalgia: Secondary | ICD-10-CM | POA: Diagnosis not present

## 2023-01-14 DIAGNOSIS — M9901 Segmental and somatic dysfunction of cervical region: Secondary | ICD-10-CM | POA: Diagnosis not present

## 2023-01-15 DIAGNOSIS — I5032 Chronic diastolic (congestive) heart failure: Secondary | ICD-10-CM | POA: Diagnosis not present

## 2023-01-17 DIAGNOSIS — I4821 Permanent atrial fibrillation: Secondary | ICD-10-CM | POA: Diagnosis not present

## 2023-01-20 ENCOUNTER — Encounter: Payer: Self-pay | Admitting: *Deleted

## 2023-01-21 DIAGNOSIS — M542 Cervicalgia: Secondary | ICD-10-CM | POA: Diagnosis not present

## 2023-01-21 DIAGNOSIS — M9901 Segmental and somatic dysfunction of cervical region: Secondary | ICD-10-CM | POA: Diagnosis not present

## 2023-01-21 DIAGNOSIS — M47812 Spondylosis without myelopathy or radiculopathy, cervical region: Secondary | ICD-10-CM | POA: Diagnosis not present

## 2023-01-21 DIAGNOSIS — M9903 Segmental and somatic dysfunction of lumbar region: Secondary | ICD-10-CM | POA: Diagnosis not present

## 2023-01-22 ENCOUNTER — Ambulatory Visit: Payer: HMO

## 2023-01-22 ENCOUNTER — Encounter: Payer: Self-pay | Admitting: *Deleted

## 2023-01-28 DIAGNOSIS — M47812 Spondylosis without myelopathy or radiculopathy, cervical region: Secondary | ICD-10-CM | POA: Diagnosis not present

## 2023-01-28 DIAGNOSIS — M9901 Segmental and somatic dysfunction of cervical region: Secondary | ICD-10-CM | POA: Diagnosis not present

## 2023-01-28 DIAGNOSIS — M9903 Segmental and somatic dysfunction of lumbar region: Secondary | ICD-10-CM | POA: Diagnosis not present

## 2023-01-28 DIAGNOSIS — M542 Cervicalgia: Secondary | ICD-10-CM | POA: Diagnosis not present

## 2023-01-29 ENCOUNTER — Ambulatory Visit: Payer: HMO | Attending: Cardiology

## 2023-01-29 DIAGNOSIS — Z5181 Encounter for therapeutic drug level monitoring: Secondary | ICD-10-CM | POA: Diagnosis not present

## 2023-01-29 DIAGNOSIS — I4819 Other persistent atrial fibrillation: Secondary | ICD-10-CM | POA: Diagnosis not present

## 2023-01-29 DIAGNOSIS — D6851 Activated protein C resistance: Secondary | ICD-10-CM | POA: Diagnosis not present

## 2023-01-29 LAB — POCT INR: INR: 1.3 — AB (ref 2.0–3.0)

## 2023-01-29 NOTE — Patient Instructions (Signed)
Description   Take 2 tablets today, then resume same dosage of Warfarin 1 tablet (5mg ) daily EXCEPT 1.5 tablets on Sundays, Wednesdays, and Fridays.  Recheck INR in 1 week.  Coumadin Clinic [phone]

## 2023-02-02 DIAGNOSIS — H01021 Squamous blepharitis right upper eyelid: Secondary | ICD-10-CM | POA: Diagnosis not present

## 2023-02-02 DIAGNOSIS — H04123 Dry eye syndrome of bilateral lacrimal glands: Secondary | ICD-10-CM | POA: Diagnosis not present

## 2023-02-02 DIAGNOSIS — D3132 Benign neoplasm of left choroid: Secondary | ICD-10-CM | POA: Diagnosis not present

## 2023-02-02 DIAGNOSIS — H35363 Drusen (degenerative) of macula, bilateral: Secondary | ICD-10-CM | POA: Diagnosis not present

## 2023-02-03 ENCOUNTER — Other Ambulatory Visit: Payer: Self-pay | Admitting: Cardiology

## 2023-02-03 DIAGNOSIS — M47812 Spondylosis without myelopathy or radiculopathy, cervical region: Secondary | ICD-10-CM | POA: Diagnosis not present

## 2023-02-03 DIAGNOSIS — M542 Cervicalgia: Secondary | ICD-10-CM | POA: Diagnosis not present

## 2023-02-03 DIAGNOSIS — I5032 Chronic diastolic (congestive) heart failure: Secondary | ICD-10-CM

## 2023-02-03 DIAGNOSIS — M9901 Segmental and somatic dysfunction of cervical region: Secondary | ICD-10-CM | POA: Diagnosis not present

## 2023-02-03 DIAGNOSIS — M9903 Segmental and somatic dysfunction of lumbar region: Secondary | ICD-10-CM | POA: Diagnosis not present

## 2023-02-05 ENCOUNTER — Ambulatory Visit: Payer: HMO

## 2023-02-05 DIAGNOSIS — Z5181 Encounter for therapeutic drug level monitoring: Secondary | ICD-10-CM

## 2023-02-05 DIAGNOSIS — I4819 Other persistent atrial fibrillation: Secondary | ICD-10-CM

## 2023-02-05 LAB — POCT INR: INR: 1.3 — AB (ref 2.0–3.0)

## 2023-02-05 NOTE — Patient Instructions (Signed)
Description   Take 2 tablets today, then START taking Warfarin 1 tablet (5mg ) daily EXCEPT 2 tablets on Sundays, Wednesdays, and Fridays. Recheck INR in 1 week.  Coumadin Clinic [phone]

## 2023-02-11 ENCOUNTER — Ambulatory Visit: Payer: HMO

## 2023-02-11 DIAGNOSIS — M47812 Spondylosis without myelopathy or radiculopathy, cervical region: Secondary | ICD-10-CM | POA: Diagnosis not present

## 2023-02-11 DIAGNOSIS — Z5181 Encounter for therapeutic drug level monitoring: Secondary | ICD-10-CM

## 2023-02-11 DIAGNOSIS — M9903 Segmental and somatic dysfunction of lumbar region: Secondary | ICD-10-CM | POA: Diagnosis not present

## 2023-02-11 DIAGNOSIS — M9901 Segmental and somatic dysfunction of cervical region: Secondary | ICD-10-CM | POA: Diagnosis not present

## 2023-02-11 DIAGNOSIS — I4819 Other persistent atrial fibrillation: Secondary | ICD-10-CM | POA: Diagnosis not present

## 2023-02-11 DIAGNOSIS — M542 Cervicalgia: Secondary | ICD-10-CM | POA: Diagnosis not present

## 2023-02-11 LAB — POCT INR: INR: 2.7 (ref 2.0–3.0)

## 2023-02-11 NOTE — Patient Instructions (Signed)
Description   Continue taking Warfarin 1 tablet (5mg ) daily EXCEPT 2 tablets on Sundays, Wednesdays, and Fridays. Recheck INR in 2 weeks.  Coumadin Clinic [phone]

## 2023-02-18 DIAGNOSIS — M542 Cervicalgia: Secondary | ICD-10-CM | POA: Diagnosis not present

## 2023-02-18 DIAGNOSIS — M9903 Segmental and somatic dysfunction of lumbar region: Secondary | ICD-10-CM | POA: Diagnosis not present

## 2023-02-18 DIAGNOSIS — M47812 Spondylosis without myelopathy or radiculopathy, cervical region: Secondary | ICD-10-CM | POA: Diagnosis not present

## 2023-02-18 DIAGNOSIS — M9901 Segmental and somatic dysfunction of cervical region: Secondary | ICD-10-CM | POA: Diagnosis not present

## 2023-02-25 DIAGNOSIS — M542 Cervicalgia: Secondary | ICD-10-CM | POA: Diagnosis not present

## 2023-02-25 DIAGNOSIS — M9901 Segmental and somatic dysfunction of cervical region: Secondary | ICD-10-CM | POA: Diagnosis not present

## 2023-02-25 DIAGNOSIS — M9903 Segmental and somatic dysfunction of lumbar region: Secondary | ICD-10-CM | POA: Diagnosis not present

## 2023-02-25 DIAGNOSIS — M47812 Spondylosis without myelopathy or radiculopathy, cervical region: Secondary | ICD-10-CM | POA: Diagnosis not present

## 2023-03-01 NOTE — Progress Notes (Unsigned)
Cardiology Clinic Note   Date: 03/02/2023 ID: Dray, Lerew Feb 13, 1946, MRN [id number]  Primary Cardiologist:  Olga Millers, MD  Patient Profile    Stanley Wright is a 77 y.o. male who presents to the clinic today for follow up after testing.     Past medical history significant for: Permanent A-fib. Onset April 2017. DCCV 11/22/2015. 3-day ZIO 01/19/2023: 100% A-fib burden. A-fib with PVCs or aberrantly conducted beats, rate controlled. LV dysfunction. Echo 12/02/2022: EF 45 to 50%.  Global hypokinesis.  Indeterminate diastolic parameters.  Mildly reduced RV function.  Normal PA pressure.  Mild LAE.  Aortic root dilatation 40 mm.  Dilatation of ascending aorta 40 mm.  Dilated IVC, RA pressure 8 mmHg. OSA. On CPAP. Factor V Leiden. PE/DVT. Parkinson disease. Frequent falls.      History of Present Illness    Stanley Wright is a longtime patient of cardiology.  He is followed by Dr. Jens Som for the above outlined history.  In summary, patient has a history of factor V Leiden with PE x 2 anticoagulated with Coumadin.  In April 2017 patient was found to be in A-fib during office visit with Dr. Jens Som.  He underwent DCCV in June 2017.  Unfortunately A-fib recurred.  Monitor November 2021 showed A-fib with PVCs or aberrantly conducted beats with rate mildly elevated.  Patient was seen for routine follow-up on 10/30/2022 by Dr. Jens Som.  Echo was updated which showed mildly reduced LV function, global hypokinesis, mildly reduced RV function.  Patient followed up on echo results with Dr. Jens Som on 01/05/2023.  Questioned if new onset LV dysfunction was tachycardia mediated related to A-fib.  3-day ZIO showed 100% A-fib burden with rate control and no medication changes were made.  Today, patient is here alone. Patient denies shortness of breath, dyspnea on exertion, orthopnea or PND. He sleeps in a recliner for physical comfort not secondary to breathing concerns. He has stable  lower extremity edema R>L. Right lower extremity has been larger secondary to breaking his foot a couple of years ago. He reports brisk diuresis with current dose of Lasix.  No chest pain, pressure, or tightness. No palpitations. Patient reports frequent falls estimated at once a month. Last fall 2 days ago when his foot slipped and he fell. He did not hit his head or sustain any injuries. He was ambulating with a walker. He lives alone. No blood in stool or urine.     ROS: All other systems reviewed and are otherwise negative except as noted in History of Present Illness.  Studies Reviewed    EKG is not ordered today.   Risk Assessment/Calculations     CHA2DS2-VASc Score = 4   This indicates a 4.8% annual risk of stroke. The patient's score is based upon: CHF History: 1 HTN History: 1 Diabetes History: 0 Stroke History: 0 Vascular Disease History: 0 Age Score: 2 Gender Score: 0             Physical Exam    VS:  BP 110/70 (BP Location: Left Arm, Patient Position: Sitting, Cuff Size: Normal)   Pulse (!) 54   Ht 6' (1.829 m)   Wt 235 lb (106.6 kg)   SpO2 94%   BMI 31.87 kg/m  , BMI Body mass index is 31.87 kg/m.  GEN: Well nourished, well developed, in no acute distress. Neck: No JVD or carotid bruits. Cardiac: Irregular rhythm, regular rate. No murmurs. No rubs or gallops.   Respiratory:  Respirations  regular and unlabored. Clear to auscultation without rales, wheezing or rhonchi. GI: Soft, nontender, nondistended. Extremities: Radials 2+ and equal bilaterally. No clubbing or cyanosis. 2+ edema right ankle, 1+ left ankle.   Skin: Warm and dry, no rash. Neuro: Strength intact.  Assessment & Plan    Permanent A-fib.  Onset April 2017.  3-day ZIO August 2024 showed 100% A-fib burden with rate control. Patient denies cardiac awareness of afib. He denies spontaneous bleeding concerns. Irregular rhythm, regular rate 54 bpm. Continue diltiazem, Coumadin. HFmrEF/chronic  pedal edema.  Echo June 2024 showed EF 45 to 50%, global hypokinesis, mildly reduced RV function.  Patient with R>L lower extremity edema at baseline for him. He reports edema started a couple of years ago after breaking right foot. Brisk diuresis on current dose of Lasix. He has 2+ edema right ankle and 1+ on the left. He is otherwise euvolemic and well compensated on exam.  Continue Lasix. Factor V Leiden.  Patient with history of PE x 2 and DVT.  On lifelong anticoagulation with Coumadin.  Patient also has history of Parkinson with frequent falls. Last fall 2 days ago without hitting his head or other injuries. Reports at least one fall a month. He is encouraged to continue utilizing walker for ambulating.  Continue Coumadin.  Disposition: Return in 3 months or sooner as needed.          Signed, Etta Grandchild. Marylon Verno, DNP, NP-C

## 2023-03-02 ENCOUNTER — Ambulatory Visit (INDEPENDENT_AMBULATORY_CARE_PROVIDER_SITE_OTHER): Payer: HMO | Admitting: *Deleted

## 2023-03-02 ENCOUNTER — Ambulatory Visit: Payer: HMO | Attending: Student | Admitting: Student

## 2023-03-02 ENCOUNTER — Other Ambulatory Visit: Payer: Self-pay | Admitting: Neurology

## 2023-03-02 ENCOUNTER — Encounter: Payer: Self-pay | Admitting: Student

## 2023-03-02 VITALS — BP 110/70 | HR 54 | Ht 72.0 in | Wt 235.0 lb

## 2023-03-02 DIAGNOSIS — D6851 Activated protein C resistance: Secondary | ICD-10-CM

## 2023-03-02 DIAGNOSIS — I4819 Other persistent atrial fibrillation: Secondary | ICD-10-CM | POA: Diagnosis not present

## 2023-03-02 DIAGNOSIS — I4821 Permanent atrial fibrillation: Secondary | ICD-10-CM

## 2023-03-02 DIAGNOSIS — Z5181 Encounter for therapeutic drug level monitoring: Secondary | ICD-10-CM

## 2023-03-02 DIAGNOSIS — I519 Heart disease, unspecified: Secondary | ICD-10-CM | POA: Diagnosis not present

## 2023-03-02 DIAGNOSIS — I5022 Chronic systolic (congestive) heart failure: Secondary | ICD-10-CM | POA: Diagnosis not present

## 2023-03-02 DIAGNOSIS — G232 Striatonigral degeneration: Secondary | ICD-10-CM

## 2023-03-02 DIAGNOSIS — G20C Parkinsonism, unspecified: Secondary | ICD-10-CM

## 2023-03-02 DIAGNOSIS — G2581 Restless legs syndrome: Secondary | ICD-10-CM

## 2023-03-02 LAB — POCT INR: INR: 2.9 (ref 2.0–3.0)

## 2023-03-02 NOTE — Patient Instructions (Addendum)
  Description   Continue taking Warfarin 1 tablet (5mg ) daily EXCEPT 2 tablets on Sundays, Wednesdays, and Fridays. Recheck INR in 4 weeks.  Coumadin Clinic [phone]

## 2023-03-02 NOTE — Patient Instructions (Signed)
Medication Instructions:  NO CHANGES *If you need a refill on your cardiac medications before your next appointment, please call your pharmacy*   Lab Work: NO LABS If you have labs (blood work) drawn today and your tests are completely normal, you will receive your results only by: MyChart Message (if you have MyChart) OR A paper copy in the mail If you have any lab test that is abnormal or we need to change your treatment, we will call you to review the results.   Testing/Procedures: NO TESTING   Follow-Up: At [facility], you and your health needs are our priority.  As part of our continuing mission to provide you with exceptional heart care, we have created designated Provider Care Teams.  These Care Teams include your primary Cardiologist (physician) and Advanced Practice Providers (APPs -  Physician Assistants and Nurse Practitioners) who all work together to provide you with the care you need, when you need it.  Your next appointment:   3-4 month(s)  Provider:   Olga Millers, MD

## 2023-03-04 DIAGNOSIS — M542 Cervicalgia: Secondary | ICD-10-CM | POA: Diagnosis not present

## 2023-03-04 DIAGNOSIS — M47812 Spondylosis without myelopathy or radiculopathy, cervical region: Secondary | ICD-10-CM | POA: Diagnosis not present

## 2023-03-04 DIAGNOSIS — M9901 Segmental and somatic dysfunction of cervical region: Secondary | ICD-10-CM | POA: Diagnosis not present

## 2023-03-04 DIAGNOSIS — M9903 Segmental and somatic dysfunction of lumbar region: Secondary | ICD-10-CM | POA: Diagnosis not present

## 2023-03-13 ENCOUNTER — Other Ambulatory Visit: Payer: Self-pay | Admitting: Cardiology

## 2023-03-13 MED ORDER — DILTIAZEM HCL ER COATED BEADS 360 MG PO CP24: 360.0000 mg | ORAL_CAPSULE | Freq: Every day | ORAL | 3 refills | Status: DC

## 2023-03-18 DIAGNOSIS — M542 Cervicalgia: Secondary | ICD-10-CM | POA: Diagnosis not present

## 2023-03-18 DIAGNOSIS — M47812 Spondylosis without myelopathy or radiculopathy, cervical region: Secondary | ICD-10-CM | POA: Diagnosis not present

## 2023-03-18 DIAGNOSIS — M9903 Segmental and somatic dysfunction of lumbar region: Secondary | ICD-10-CM | POA: Diagnosis not present

## 2023-03-18 DIAGNOSIS — M9901 Segmental and somatic dysfunction of cervical region: Secondary | ICD-10-CM | POA: Diagnosis not present

## 2023-03-25 DIAGNOSIS — M9903 Segmental and somatic dysfunction of lumbar region: Secondary | ICD-10-CM | POA: Diagnosis not present

## 2023-03-25 DIAGNOSIS — M47812 Spondylosis without myelopathy or radiculopathy, cervical region: Secondary | ICD-10-CM | POA: Diagnosis not present

## 2023-03-25 DIAGNOSIS — M9901 Segmental and somatic dysfunction of cervical region: Secondary | ICD-10-CM | POA: Diagnosis not present

## 2023-03-25 DIAGNOSIS — M542 Cervicalgia: Secondary | ICD-10-CM | POA: Diagnosis not present

## 2023-03-30 ENCOUNTER — Ambulatory Visit: Payer: HMO | Attending: Cardiology | Admitting: *Deleted

## 2023-03-30 DIAGNOSIS — D6851 Activated protein C resistance: Secondary | ICD-10-CM

## 2023-03-30 DIAGNOSIS — Z5181 Encounter for therapeutic drug level monitoring: Secondary | ICD-10-CM

## 2023-03-30 DIAGNOSIS — I4819 Other persistent atrial fibrillation: Secondary | ICD-10-CM

## 2023-03-30 LAB — POCT INR: INR: 4.9 — AB (ref 2.0–3.0)

## 2023-03-30 NOTE — Patient Instructions (Signed)
Description   Do not take any warfarin today and no warfarin tomorrow then continue taking Warfarin 1 tablet (5mg ) daily EXCEPT 2 tablets on Sundays, Wednesdays, and Fridays. Recheck INR in 3 weeks.  Coumadin Clinic [phone]

## 2023-04-01 DIAGNOSIS — M542 Cervicalgia: Secondary | ICD-10-CM | POA: Diagnosis not present

## 2023-04-01 DIAGNOSIS — M9901 Segmental and somatic dysfunction of cervical region: Secondary | ICD-10-CM | POA: Diagnosis not present

## 2023-04-01 DIAGNOSIS — M47812 Spondylosis without myelopathy or radiculopathy, cervical region: Secondary | ICD-10-CM | POA: Diagnosis not present

## 2023-04-01 DIAGNOSIS — M9903 Segmental and somatic dysfunction of lumbar region: Secondary | ICD-10-CM | POA: Diagnosis not present
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
## 2023-04-08 DIAGNOSIS — M542 Cervicalgia: Secondary | ICD-10-CM | POA: Diagnosis not present

## 2023-04-08 DIAGNOSIS — M9903 Segmental and somatic dysfunction of lumbar region: Secondary | ICD-10-CM | POA: Diagnosis not present

## 2023-04-08 DIAGNOSIS — M9901 Segmental and somatic dysfunction of cervical region: Secondary | ICD-10-CM | POA: Diagnosis not present

## 2023-04-08 DIAGNOSIS — M47812 Spondylosis without myelopathy or radiculopathy, cervical region: Secondary | ICD-10-CM | POA: Diagnosis not present

## 2023-04-14 ENCOUNTER — Ambulatory Visit (INDEPENDENT_AMBULATORY_CARE_PROVIDER_SITE_OTHER): Payer: HMO | Admitting: Neurology

## 2023-04-14 ENCOUNTER — Encounter: Payer: Self-pay | Admitting: Neurology

## 2023-04-14 VITALS — BP 136/82 | HR 73 | Ht 72.0 in | Wt 235.0 lb

## 2023-04-14 DIAGNOSIS — H02401 Unspecified ptosis of right eyelid: Secondary | ICD-10-CM | POA: Diagnosis not present

## 2023-04-14 DIAGNOSIS — G20C Parkinsonism, unspecified: Secondary | ICD-10-CM

## 2023-04-15 DIAGNOSIS — M9901 Segmental and somatic dysfunction of cervical region: Secondary | ICD-10-CM | POA: Diagnosis not present

## 2023-04-15 DIAGNOSIS — M47812 Spondylosis without myelopathy or radiculopathy, cervical region: Secondary | ICD-10-CM | POA: Diagnosis not present

## 2023-04-15 DIAGNOSIS — M9903 Segmental and somatic dysfunction of lumbar region: Secondary | ICD-10-CM | POA: Diagnosis not present

## 2023-04-15 DIAGNOSIS — M542 Cervicalgia: Secondary | ICD-10-CM | POA: Diagnosis not present

## 2023-04-20 ENCOUNTER — Ambulatory Visit: Payer: HMO | Attending: Internal Medicine

## 2023-04-20 DIAGNOSIS — Z5181 Encounter for therapeutic drug level monitoring: Secondary | ICD-10-CM

## 2023-04-20 DIAGNOSIS — I4819 Other persistent atrial fibrillation: Secondary | ICD-10-CM

## 2023-04-20 LAB — POCT INR: INR: 2.9 (ref 2.0–3.0)

## 2023-04-20 NOTE — Patient Instructions (Signed)
Description   Continue taking Warfarin 1 tablet (5mg ) daily EXCEPT 2 tablets on Sundays, Wednesdays, and Fridays. Recheck INR in 5 weeks.  Coumadin Clinic [phone]

## 2023-04-22 DIAGNOSIS — M542 Cervicalgia: Secondary | ICD-10-CM | POA: Diagnosis not present

## 2023-04-22 DIAGNOSIS — M47812 Spondylosis without myelopathy or radiculopathy, cervical region: Secondary | ICD-10-CM | POA: Diagnosis not present

## 2023-04-22 DIAGNOSIS — M9903 Segmental and somatic dysfunction of lumbar region: Secondary | ICD-10-CM | POA: Diagnosis not present

## 2023-04-22 DIAGNOSIS — M9901 Segmental and somatic dysfunction of cervical region: Secondary | ICD-10-CM | POA: Diagnosis not present

## 2023-04-28 DIAGNOSIS — M47812 Spondylosis without myelopathy or radiculopathy, cervical region: Secondary | ICD-10-CM | POA: Diagnosis not present

## 2023-04-28 DIAGNOSIS — M542 Cervicalgia: Secondary | ICD-10-CM | POA: Diagnosis not present

## 2023-04-28 DIAGNOSIS — M9901 Segmental and somatic dysfunction of cervical region: Secondary | ICD-10-CM | POA: Diagnosis not present

## 2023-04-28 DIAGNOSIS — M9903 Segmental and somatic dysfunction of lumbar region: Secondary | ICD-10-CM | POA: Diagnosis not present
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
## 2023-05-06 DIAGNOSIS — M9903 Segmental and somatic dysfunction of lumbar region: Secondary | ICD-10-CM | POA: Diagnosis not present

## 2023-05-06 DIAGNOSIS — M542 Cervicalgia: Secondary | ICD-10-CM | POA: Diagnosis not present

## 2023-05-06 DIAGNOSIS — M9901 Segmental and somatic dysfunction of cervical region: Secondary | ICD-10-CM | POA: Diagnosis not present

## 2023-05-06 DIAGNOSIS — M47812 Spondylosis without myelopathy or radiculopathy, cervical region: Secondary | ICD-10-CM | POA: Diagnosis not present

## 2023-05-07 ENCOUNTER — Ambulatory Visit: Payer: HMO | Admitting: Neurology

## 2023-05-07 ENCOUNTER — Encounter: Payer: Self-pay | Admitting: Neurology

## 2023-05-07 ENCOUNTER — Other Ambulatory Visit (INDEPENDENT_AMBULATORY_CARE_PROVIDER_SITE_OTHER): Payer: HMO

## 2023-05-07 VITALS — BP 122/71 | HR 95 | Resp 97 | Ht 72.0 in | Wt 235.0 lb

## 2023-05-07 DIAGNOSIS — H02403 Unspecified ptosis of bilateral eyelids: Secondary | ICD-10-CM | POA: Diagnosis not present

## 2023-05-07 DIAGNOSIS — R29810 Facial weakness: Secondary | ICD-10-CM

## 2023-05-07 DIAGNOSIS — G20A2 Parkinson's disease without dyskinesia, with fluctuations: Secondary | ICD-10-CM

## 2023-05-07 DIAGNOSIS — G243 Spasmodic torticollis: Secondary | ICD-10-CM

## 2023-05-07 NOTE — Patient Instructions (Addendum)
I saw you today for the drooping of your eyelids. I am not sure the cause of your symptoms, but I want to evaluate further with: -Blood work to repeat the myasthenia gravis antibodies -Muscle and nerve test to test for myasthenia or other nerve problems (see more information below)  I will be in touch when I have your results to discuss next steps.  The physicians and staff at [facility] are committed to providing excellent care. You may receive a survey requesting feedback about your experience at our office. We strive to receive "very good" responses to the survey questions. If you feel that your experience would prevent you from giving the office a "very good " response, please contact our office to try to remedy the situation. We may be reached at [phone]. Thank you for taking the time out of your busy day to complete the survey.  Jacquelyne Balint, MD [facility]

## 2023-05-08 LAB — TSH: TSH: 2.04 u[IU]/mL (ref 0.35–5.50)

## 2023-05-09 ENCOUNTER — Other Ambulatory Visit: Payer: Self-pay | Admitting: Neurology

## 2023-05-09 DIAGNOSIS — G20C Parkinsonism, unspecified: Secondary | ICD-10-CM

## 2023-05-13 DIAGNOSIS — M9901 Segmental and somatic dysfunction of cervical region: Secondary | ICD-10-CM | POA: Diagnosis not present

## 2023-05-13 DIAGNOSIS — M542 Cervicalgia: Secondary | ICD-10-CM | POA: Diagnosis not present

## 2023-05-13 DIAGNOSIS — M47812 Spondylosis without myelopathy or radiculopathy, cervical region: Secondary | ICD-10-CM | POA: Diagnosis not present

## 2023-05-13 DIAGNOSIS — M9903 Segmental and somatic dysfunction of lumbar region: Secondary | ICD-10-CM | POA: Diagnosis not present

## 2023-05-14 LAB — MYASTHENIA GRAVIS PANEL 2
A CHR BINDING ABS: <0.3 nmol/L
ACHR Blocking Abs: <15 %{inhibition} (ref <15)
Acetylchol Modul Ab: <1 %{inhibition}

## 2023-05-20 DIAGNOSIS — M47812 Spondylosis without myelopathy or radiculopathy, cervical region: Secondary | ICD-10-CM | POA: Diagnosis not present

## 2023-05-20 DIAGNOSIS — M9903 Segmental and somatic dysfunction of lumbar region: Secondary | ICD-10-CM | POA: Diagnosis not present

## 2023-05-20 DIAGNOSIS — M9901 Segmental and somatic dysfunction of cervical region: Secondary | ICD-10-CM | POA: Diagnosis not present

## 2023-05-20 DIAGNOSIS — M542 Cervicalgia: Secondary | ICD-10-CM | POA: Diagnosis not present

## 2023-05-25 ENCOUNTER — Ambulatory Visit: Payer: HMO | Attending: Cardiovascular Disease | Admitting: *Deleted

## 2023-05-25 DIAGNOSIS — I4819 Other persistent atrial fibrillation: Secondary | ICD-10-CM

## 2023-05-25 DIAGNOSIS — Z5181 Encounter for therapeutic drug level monitoring: Secondary | ICD-10-CM | POA: Diagnosis not present

## 2023-05-25 DIAGNOSIS — D6851 Activated protein C resistance: Secondary | ICD-10-CM | POA: Diagnosis not present

## 2023-05-25 LAB — POCT INR: INR: 3.6 — AB (ref 2.0–3.0)

## 2023-05-25 NOTE — Patient Instructions (Signed)
Description   Do not take any warfarin today then continue taking Warfarin 1 tablet (5mg ) daily EXCEPT 2 tablets on Sundays, Wednesdays, and Fridays. Recheck INR in 4 weeks.  Coumadin Clinic [phone]

## 2023-05-27 DIAGNOSIS — M47812 Spondylosis without myelopathy or radiculopathy, cervical region: Secondary | ICD-10-CM | POA: Diagnosis not present

## 2023-05-27 DIAGNOSIS — M9903 Segmental and somatic dysfunction of lumbar region: Secondary | ICD-10-CM | POA: Diagnosis not present

## 2023-05-27 DIAGNOSIS — M542 Cervicalgia: Secondary | ICD-10-CM | POA: Diagnosis not present

## 2023-05-27 DIAGNOSIS — M9901 Segmental and somatic dysfunction of cervical region: Secondary | ICD-10-CM | POA: Diagnosis not present

## 2023-06-03 ENCOUNTER — Telehealth: Payer: Self-pay | Admitting: Neurology

## 2023-06-03 DIAGNOSIS — M9903 Segmental and somatic dysfunction of lumbar region: Secondary | ICD-10-CM | POA: Diagnosis not present

## 2023-06-03 DIAGNOSIS — M542 Cervicalgia: Secondary | ICD-10-CM | POA: Diagnosis not present

## 2023-06-03 DIAGNOSIS — M47812 Spondylosis without myelopathy or radiculopathy, cervical region: Secondary | ICD-10-CM | POA: Diagnosis not present

## 2023-06-03 DIAGNOSIS — M9901 Segmental and somatic dysfunction of cervical region: Secondary | ICD-10-CM | POA: Diagnosis not present

## 2023-06-03 NOTE — Telephone Encounter (Signed)
Called and informed patient of results from Dr. Loleta Chance. He understood.

## 2023-06-03 NOTE — Telephone Encounter (Signed)
Pt called in wanting to see if we had gotten results from his lab test?

## 2023-06-04 ENCOUNTER — Other Ambulatory Visit: Payer: Self-pay | Admitting: Neurology

## 2023-06-04 DIAGNOSIS — G2581 Restless legs syndrome: Secondary | ICD-10-CM

## 2023-06-04 DIAGNOSIS — G20C Parkinsonism, unspecified: Secondary | ICD-10-CM

## 2023-06-04 DIAGNOSIS — G232 Striatonigral degeneration: Secondary | ICD-10-CM

## 2023-06-05 ENCOUNTER — Encounter: Payer: Self-pay | Admitting: Neurology

## 2023-06-08 ENCOUNTER — Telehealth: Payer: Self-pay | Admitting: Neurology

## 2023-06-08 ENCOUNTER — Other Ambulatory Visit: Payer: Self-pay | Admitting: Neurology

## 2023-06-08 ENCOUNTER — Ambulatory Visit (INDEPENDENT_AMBULATORY_CARE_PROVIDER_SITE_OTHER): Payer: HMO | Admitting: Neurology

## 2023-06-08 DIAGNOSIS — H02401 Unspecified ptosis of right eyelid: Secondary | ICD-10-CM

## 2023-06-08 DIAGNOSIS — H02403 Unspecified ptosis of bilateral eyelids: Secondary | ICD-10-CM | POA: Diagnosis not present

## 2023-06-08 DIAGNOSIS — R29810 Facial weakness: Secondary | ICD-10-CM

## 2023-06-08 DIAGNOSIS — G20A2 Parkinson's disease without dyskinesia, with fluctuations: Secondary | ICD-10-CM

## 2023-06-08 DIAGNOSIS — G243 Spasmodic torticollis: Secondary | ICD-10-CM

## 2023-06-08 MED ORDER — PYRIDOSTIGMINE BROMIDE 60 MG PO TABS: 60.0000 mg | ORAL_TABLET | Freq: Three times a day (TID) | ORAL | 0 refills | Status: DC

## 2023-06-08 NOTE — Procedures (Signed)
[facility]  [address] [place], [address]  [place], [place] [address] Tel: [phone] Fax: [phone] Test Date:  06/08/2023  Patient: Stanley Wright DOB: 08/02/1945 Physician: Jacquelyne Balint, MD  Sex: Male Height: 6\' 0"  Ref Phys: Jacquelyne Balint, MD  ID#: [id number]   Technician:    History: This is a 77 year old male with ptosis.  NCV & EMG Findings: Extensive electrodiagnostic evaluation of the right upper limb with additional nerve conduction studies of the right facial nerve and 3 Hz repetitive nerve stimulation of the right ulnar and facial motor nerves shows: Right median and ulnar sensory responses show prolonged distal peak latency (R4.5, R3.7 ms). Right radial sensory response is within normal limits. Right median (APB) motor response shows prolonged distal onset latency (5.1 ms) and reduced amplitude (1.93 mV). Right ulnar (ADM) motor response is within normal limits. 3 Hz repetitive nerve stimulation of the right ulnar (ADM) and facial (nasalis) nerves shows no abnormal decrement. Study was technically difficult as patient was not able to stay still, introducing artifact during RNS. There is no evidence of active or chronic motor axon loss changes affecting any of the tested muscles on needle examination. Motor unit configuration and recruitment pattern is within normal limits.  Impression: This is an abnormal study. The findings are most consistent with the following: No definitive electrodiagnostic evidence of a disorder of neuromuscular junction (ie: myasthenia gravis). RNS was technically challenging due to movement. Evidence of a right median mononeuropathy at or distal to the wrist, consistent with carpal tunnel syndrome, moderate to severe in degree electrically. No electrodiagnostic evidence of a right cervical (C5-C8) motor radiculopathy. Right ulnar sensory response shows prolonged distal peak latency, which in the setting of other normal ulnar studies and  needle examination is of unclear clinical significance.    ___________________________ Jacquelyne Balint, MD    Nerve Conduction Studies Motor Nerve Results    Latency Amplitude F-Lat Segment Distance CV Comment  Site (ms) Norm (mV) Norm (ms)  (cm) (m/s) Norm   Right Facial - Nasalis Motor  Mastoid 2.8  < 4.2 1.48  > 1.00        Right Median (APB) Motor  Wrist *5.1  < 4.0 *1.93  > 5.0        Elbow 9.9 - 1.40 -  Elbow-Wrist 32 67  > 50   Right Ulnar (ADM) Motor  Wrist 2.5  < 3.1 14.0  > 7.0        Bel elbow 6.5 - 11.8 -  Bel elbow-Wrist 23 58  > 50   Ab elbow 8.2 - 11.7 -  Ab elbow-Bel elbow 10 59 -    Sensory Sites    Neg Peak Lat Amplitude (O-P) Segment Distance Velocity Comment  Site (ms) Norm (V) Norm  (cm) (ms)   Right Median Sensory  Wrist-Dig II *4.5  < 3.8 12  > 10 Wrist-Dig II 13    Right Radial Sensory  Forearm-Wrist 2.4  < 2.8 14  > 10 Forearm-Wrist 10    Right Ulnar Sensory  Wrist-Dig V *3.7  < 3.2 10  > 5 Wrist-Dig V 11     Electromyography   Side Muscle Ins.Act Fibs Fasc Recrt Amp Dur Poly Activation Comment  Right FDI Nml Nml Nml Nml Nml Nml Nml Nml N/A  Right EIP Nml Nml Nml Nml Nml Nml Nml Nml N/A  Right Pronator teres Nml Nml Nml Nml Nml Nml Nml Nml N/A  Right Biceps Nml Nml Nml Nml  Nml Nml Nml Nml N/A  Right Triceps lat hd Nml Nml Nml Nml Nml Nml Nml Nml N/A  Right Deltoid Nml Nml Nml Nml Nml Nml Nml Nml N/A      Waveforms:  Motor        Sensory

## 2023-06-08 NOTE — Telephone Encounter (Signed)
Discussed the results of patient's EMG after the procedure today. It showed evidence of right carpal tunnel syndrome but no definitive abnormalities on repetitive nerve stimulation to suggest myasthenia gravis, though RNS was technically challenging. Given this, I offered a mestinon trial to see if this helps with ptosis. Patient agreed to try it for about a month then we will discuss if it helped and if he should continue it.  All questions were answered.  Jacquelyne Balint, MD [facility]

## 2023-06-09 DIAGNOSIS — M9903 Segmental and somatic dysfunction of lumbar region: Secondary | ICD-10-CM | POA: Diagnosis not present

## 2023-06-09 DIAGNOSIS — M9901 Segmental and somatic dysfunction of cervical region: Secondary | ICD-10-CM | POA: Diagnosis not present

## 2023-06-09 DIAGNOSIS — M542 Cervicalgia: Secondary | ICD-10-CM | POA: Diagnosis not present

## 2023-06-09 DIAGNOSIS — M47812 Spondylosis without myelopathy or radiculopathy, cervical region: Secondary | ICD-10-CM | POA: Diagnosis not present

## 2023-06-12 ENCOUNTER — Other Ambulatory Visit: Payer: Self-pay | Admitting: Neurology

## 2023-06-12 ENCOUNTER — Other Ambulatory Visit: Payer: Self-pay

## 2023-06-12 DIAGNOSIS — G232 Striatonigral degeneration: Secondary | ICD-10-CM

## 2023-06-12 DIAGNOSIS — G2581 Restless legs syndrome: Secondary | ICD-10-CM

## 2023-06-12 DIAGNOSIS — G20C Parkinsonism, unspecified: Secondary | ICD-10-CM

## 2023-06-12 MED ORDER — PRAMIPEXOLE DIHYDROCHLORIDE 0.25 MG PO TABS: 0.2500 mg | ORAL_TABLET | Freq: Every day | ORAL | 0 refills | Status: DC

## 2023-06-16 DIAGNOSIS — M542 Cervicalgia: Secondary | ICD-10-CM | POA: Diagnosis not present

## 2023-06-16 DIAGNOSIS — M9901 Segmental and somatic dysfunction of cervical region: Secondary | ICD-10-CM | POA: Diagnosis not present

## 2023-06-16 DIAGNOSIS — M47812 Spondylosis without myelopathy or radiculopathy, cervical region: Secondary | ICD-10-CM | POA: Diagnosis not present

## 2023-06-16 DIAGNOSIS — M9903 Segmental and somatic dysfunction of lumbar region: Secondary | ICD-10-CM | POA: Diagnosis not present

## 2023-06-22 ENCOUNTER — Telehealth: Payer: Self-pay | Admitting: Neurology

## 2023-06-22 ENCOUNTER — Ambulatory Visit: Payer: HMO | Attending: Internal Medicine | Admitting: *Deleted

## 2023-06-22 ENCOUNTER — Other Ambulatory Visit: Payer: Self-pay

## 2023-06-22 DIAGNOSIS — G20C Parkinsonism, unspecified: Secondary | ICD-10-CM

## 2023-06-22 DIAGNOSIS — Z5181 Encounter for therapeutic drug level monitoring: Secondary | ICD-10-CM | POA: Diagnosis not present

## 2023-06-22 DIAGNOSIS — I4819 Other persistent atrial fibrillation: Secondary | ICD-10-CM

## 2023-06-22 DIAGNOSIS — D6851 Activated protein C resistance: Secondary | ICD-10-CM

## 2023-06-22 LAB — POCT INR: INR: 2.5 (ref 2.0–3.0)

## 2023-06-22 MED ORDER — CARBIDOPA-LEVODOPA 25-100 MG PO TABS: ORAL_TABLET | ORAL | 0 refills | Status: DC

## 2023-06-22 NOTE — Telephone Encounter (Signed)
 Patient would like to speak to someone about his carbidopa levodopa

## 2023-06-22 NOTE — Patient Instructions (Signed)
  Description   Continue taking Warfarin 1 tablet (5mg ) daily EXCEPT 2 tablets on Sundays, Wednesdays, and Fridays. Recheck INR in 4 weeks.  Coumadin Clinic [phone]

## 2023-06-22 NOTE — Telephone Encounter (Signed)
Called pateint and sent in refill

## 2023-06-29 ENCOUNTER — Telehealth: Payer: Self-pay | Admitting: Cardiology

## 2023-06-29 DIAGNOSIS — I5032 Chronic diastolic (congestive) heart failure: Secondary | ICD-10-CM

## 2023-06-29 NOTE — Telephone Encounter (Signed)
 Pt c/o medication issue:  1. Name of Medication:   furosemide  (LASIX ) 40 MG tablet Take 1 tablet by mouth once dailyPatient taking differently: Take 60 mg by mouth daily.    2. How are you currently taking this medication (dosage and times per day)? As written   3. Are you having a reaction (difficulty breathing--STAT)? no  4. What is your medication issue? Pt called in asking if this rx can be changed to 60 mg daily as he has been taking and sent to [facility] pharmacy.

## 2023-06-29 NOTE — Telephone Encounter (Signed)
 Called and spoke to patient. Verified name and DOB. Patient requesting prescription for Lasix 30 mg be sent to [facility] on [address]. Please advise  if okay to send in prescription for Lasix 60 mg.

## 2023-06-30 MED ORDER — FUROSEMIDE 40 MG PO TABS: 60.0000 mg | ORAL_TABLET | Freq: Every day | ORAL | 2 refills | Status: DC

## 2023-06-30 NOTE — Telephone Encounter (Signed)
 Prescription sent to [facility] for Lasix 60 mg daily with 90 day supply. Attempted to call patient to schedule a follow up appt with no answer. Left vm to call back.

## 2023-07-01 NOTE — Telephone Encounter (Signed)
 Called and spoke to patient. Notified Lasix  60 mg sent to pharmacy per Morey Ar, NP.    Okay to refill Lasix  40 mg take 1.5 tablets daily (for a total of 60 mg) for 30 days. Patient needs to be scheduled for a follow up visit.  Thank you!  DW

## 2023-07-07 ENCOUNTER — Telehealth: Payer: Self-pay | Admitting: Neurology

## 2023-07-07 NOTE — Telephone Encounter (Signed)
Called patient to discuss mestinon trial. Patient has been taking about a month, 60 mg TID but has not noticed any change in ptosis. This makes the diagnosis of myasthenia gravis very unlikely. I recommended patient stop mestinon since it was not helping. He agreed.  All questions were answered.  Jacquelyne Balint, MD [facility]

## 2023-07-24 ENCOUNTER — Ambulatory Visit: Payer: HMO | Attending: Cardiology

## 2023-07-24 DIAGNOSIS — D6851 Activated protein C resistance: Secondary | ICD-10-CM | POA: Diagnosis not present

## 2023-07-24 DIAGNOSIS — I4819 Other persistent atrial fibrillation: Secondary | ICD-10-CM | POA: Diagnosis not present

## 2023-07-24 DIAGNOSIS — Z5181 Encounter for therapeutic drug level monitoring: Secondary | ICD-10-CM

## 2023-07-24 LAB — POCT INR: INR: 5.4 — AB (ref 2.0–3.0)

## 2023-07-24 NOTE — Patient Instructions (Signed)
 Hold today and Saturday then Continue taking Warfarin 1 tablet (5mg ) daily EXCEPT 2 tablets on Sundays, Wednesdays, and Fridays. Recheck INR in 2 weeks.  Coumadin  Clinic [phone]

## 2023-08-07 ENCOUNTER — Ambulatory Visit: Payer: HMO

## 2023-08-08 ENCOUNTER — Other Ambulatory Visit: Payer: Self-pay | Admitting: Neurology

## 2023-08-08 DIAGNOSIS — H02401 Unspecified ptosis of right eyelid: Secondary | ICD-10-CM

## 2023-08-08 DIAGNOSIS — G2581 Restless legs syndrome: Secondary | ICD-10-CM

## 2023-08-08 DIAGNOSIS — G232 Striatonigral degeneration: Secondary | ICD-10-CM

## 2023-08-08 DIAGNOSIS — G20C Parkinsonism, unspecified: Secondary | ICD-10-CM

## 2023-08-18 ENCOUNTER — Telehealth: Payer: Self-pay

## 2023-08-18 ENCOUNTER — Ambulatory Visit: Payer: HMO | Attending: Cardiovascular Disease

## 2023-08-18 NOTE — Telephone Encounter (Signed)
 Lpmtcb to reschedule INR appt

## 2023-08-25 ENCOUNTER — Ambulatory Visit

## 2023-08-26 DIAGNOSIS — M9901 Segmental and somatic dysfunction of cervical region: Secondary | ICD-10-CM | POA: Diagnosis not present

## 2023-08-26 DIAGNOSIS — M9902 Segmental and somatic dysfunction of thoracic region: Secondary | ICD-10-CM | POA: Diagnosis not present

## 2023-08-26 DIAGNOSIS — M9903 Segmental and somatic dysfunction of lumbar region: Secondary | ICD-10-CM | POA: Diagnosis not present

## 2023-08-26 DIAGNOSIS — M9904 Segmental and somatic dysfunction of sacral region: Secondary | ICD-10-CM | POA: Diagnosis not present

## 2023-08-31 ENCOUNTER — Ambulatory Visit: Attending: Internal Medicine | Admitting: *Deleted

## 2023-08-31 DIAGNOSIS — Z5181 Encounter for therapeutic drug level monitoring: Secondary | ICD-10-CM | POA: Diagnosis not present

## 2023-08-31 DIAGNOSIS — D6851 Activated protein C resistance: Secondary | ICD-10-CM

## 2023-08-31 DIAGNOSIS — I4819 Other persistent atrial fibrillation: Secondary | ICD-10-CM | POA: Diagnosis not present

## 2023-08-31 LAB — POCT INR: INR: 4.5 — AB (ref 2.0–3.0)

## 2023-08-31 NOTE — Patient Instructions (Signed)
 Description   Do not take any warfarin today and no warfarin tomorrow then START taking Warfarin 1 tablet (5mg ) daily EXCEPT 2 tablets on Sundays and Wednesdays. Recheck INR in 2 weeks.  Coumadin Clinic [phone]

## 2023-09-02 DIAGNOSIS — M9904 Segmental and somatic dysfunction of sacral region: Secondary | ICD-10-CM | POA: Diagnosis not present

## 2023-09-02 DIAGNOSIS — M9902 Segmental and somatic dysfunction of thoracic region: Secondary | ICD-10-CM | POA: Diagnosis not present

## 2023-09-02 DIAGNOSIS — M9901 Segmental and somatic dysfunction of cervical region: Secondary | ICD-10-CM | POA: Diagnosis not present

## 2023-09-02 DIAGNOSIS — M9903 Segmental and somatic dysfunction of lumbar region: Secondary | ICD-10-CM | POA: Diagnosis not present

## 2023-09-09 DIAGNOSIS — M9902 Segmental and somatic dysfunction of thoracic region: Secondary | ICD-10-CM | POA: Diagnosis not present

## 2023-09-09 DIAGNOSIS — M9901 Segmental and somatic dysfunction of cervical region: Secondary | ICD-10-CM | POA: Diagnosis not present

## 2023-09-09 DIAGNOSIS — M9904 Segmental and somatic dysfunction of sacral region: Secondary | ICD-10-CM | POA: Diagnosis not present

## 2023-09-09 DIAGNOSIS — M9903 Segmental and somatic dysfunction of lumbar region: Secondary | ICD-10-CM | POA: Diagnosis not present

## 2023-09-10 ENCOUNTER — Emergency Department (HOSPITAL_COMMUNITY)

## 2023-09-10 ENCOUNTER — Inpatient Hospital Stay (HOSPITAL_COMMUNITY)
Admission: EM | Admit: 2023-09-10 | Discharge: 2023-09-15 | DRG: 291 | Disposition: A | Attending: Internal Medicine | Admitting: Internal Medicine

## 2023-09-10 ENCOUNTER — Encounter (HOSPITAL_COMMUNITY): Payer: Self-pay | Admitting: Emergency Medicine

## 2023-09-10 ENCOUNTER — Other Ambulatory Visit: Payer: Self-pay

## 2023-09-10 DIAGNOSIS — Z515 Encounter for palliative care: Secondary | ICD-10-CM | POA: Diagnosis not present

## 2023-09-10 DIAGNOSIS — R008 Other abnormalities of heart beat: Secondary | ICD-10-CM | POA: Diagnosis present

## 2023-09-10 DIAGNOSIS — Z91199 Patient's noncompliance with other medical treatment and regimen due to unspecified reason: Secondary | ICD-10-CM

## 2023-09-10 DIAGNOSIS — H269 Unspecified cataract: Secondary | ICD-10-CM | POA: Diagnosis present

## 2023-09-10 DIAGNOSIS — I5023 Acute on chronic systolic (congestive) heart failure: Secondary | ICD-10-CM | POA: Diagnosis not present

## 2023-09-10 DIAGNOSIS — I4819 Other persistent atrial fibrillation: Secondary | ICD-10-CM | POA: Diagnosis present

## 2023-09-10 DIAGNOSIS — I4821 Permanent atrial fibrillation: Secondary | ICD-10-CM | POA: Diagnosis present

## 2023-09-10 DIAGNOSIS — D6851 Activated protein C resistance: Secondary | ICD-10-CM | POA: Diagnosis present

## 2023-09-10 DIAGNOSIS — Z86718 Personal history of other venous thrombosis and embolism: Secondary | ICD-10-CM

## 2023-09-10 DIAGNOSIS — R0600 Dyspnea, unspecified: Secondary | ICD-10-CM

## 2023-09-10 DIAGNOSIS — Z803 Family history of malignant neoplasm of breast: Secondary | ICD-10-CM

## 2023-09-10 DIAGNOSIS — R0989 Other specified symptoms and signs involving the circulatory and respiratory systems: Secondary | ICD-10-CM | POA: Diagnosis not present

## 2023-09-10 DIAGNOSIS — E66811 Obesity, class 1: Secondary | ICD-10-CM | POA: Diagnosis present

## 2023-09-10 DIAGNOSIS — K219 Gastro-esophageal reflux disease without esophagitis: Secondary | ICD-10-CM | POA: Diagnosis present

## 2023-09-10 DIAGNOSIS — Z8701 Personal history of pneumonia (recurrent): Secondary | ICD-10-CM

## 2023-09-10 DIAGNOSIS — G20A1 Parkinson's disease without dyskinesia, without mention of fluctuations: Secondary | ICD-10-CM | POA: Diagnosis present

## 2023-09-10 DIAGNOSIS — Z7901 Long term (current) use of anticoagulants: Secondary | ICD-10-CM

## 2023-09-10 DIAGNOSIS — I77811 Abdominal aortic ectasia: Secondary | ICD-10-CM | POA: Diagnosis present

## 2023-09-10 DIAGNOSIS — I4891 Unspecified atrial fibrillation: Secondary | ICD-10-CM | POA: Diagnosis present

## 2023-09-10 DIAGNOSIS — Z974 Presence of external hearing-aid: Secondary | ICD-10-CM

## 2023-09-10 DIAGNOSIS — G20A2 Parkinson's disease without dyskinesia, with fluctuations: Secondary | ICD-10-CM | POA: Diagnosis not present

## 2023-09-10 DIAGNOSIS — Z9889 Other specified postprocedural states: Secondary | ICD-10-CM

## 2023-09-10 DIAGNOSIS — R918 Other nonspecific abnormal finding of lung field: Secondary | ICD-10-CM | POA: Diagnosis not present

## 2023-09-10 DIAGNOSIS — Z7189 Other specified counseling: Secondary | ICD-10-CM | POA: Diagnosis not present

## 2023-09-10 DIAGNOSIS — I5043 Acute on chronic combined systolic (congestive) and diastolic (congestive) heart failure: Principal | ICD-10-CM | POA: Diagnosis present

## 2023-09-10 DIAGNOSIS — I2489 Other forms of acute ischemic heart disease: Secondary | ICD-10-CM | POA: Diagnosis present

## 2023-09-10 DIAGNOSIS — I429 Cardiomyopathy, unspecified: Secondary | ICD-10-CM | POA: Diagnosis present

## 2023-09-10 DIAGNOSIS — R296 Repeated falls: Secondary | ICD-10-CM | POA: Diagnosis present

## 2023-09-10 DIAGNOSIS — Z1152 Encounter for screening for COVID-19: Secondary | ICD-10-CM

## 2023-09-10 DIAGNOSIS — R011 Cardiac murmur, unspecified: Secondary | ICD-10-CM | POA: Diagnosis not present

## 2023-09-10 DIAGNOSIS — I493 Ventricular premature depolarization: Secondary | ICD-10-CM | POA: Diagnosis present

## 2023-09-10 DIAGNOSIS — R600 Localized edema: Secondary | ICD-10-CM | POA: Diagnosis not present

## 2023-09-10 DIAGNOSIS — Z79899 Other long term (current) drug therapy: Secondary | ICD-10-CM

## 2023-09-10 DIAGNOSIS — J189 Pneumonia, unspecified organism: Secondary | ICD-10-CM | POA: Diagnosis not present

## 2023-09-10 DIAGNOSIS — Z981 Arthrodesis status: Secondary | ICD-10-CM

## 2023-09-10 DIAGNOSIS — I952 Hypotension due to drugs: Secondary | ICD-10-CM | POA: Diagnosis not present

## 2023-09-10 DIAGNOSIS — Z8249 Family history of ischemic heart disease and other diseases of the circulatory system: Secondary | ICD-10-CM

## 2023-09-10 DIAGNOSIS — N400 Enlarged prostate without lower urinary tract symptoms: Secondary | ICD-10-CM | POA: Diagnosis present

## 2023-09-10 DIAGNOSIS — Z902 Acquired absence of lung [part of]: Secondary | ICD-10-CM

## 2023-09-10 DIAGNOSIS — I110 Hypertensive heart disease with heart failure: Principal | ICD-10-CM | POA: Diagnosis present

## 2023-09-10 DIAGNOSIS — Z85828 Personal history of other malignant neoplasm of skin: Secondary | ICD-10-CM

## 2023-09-10 DIAGNOSIS — M19071 Primary osteoarthritis, right ankle and foot: Secondary | ICD-10-CM | POA: Diagnosis present

## 2023-09-10 DIAGNOSIS — Z6831 Body mass index (BMI) 31.0-31.9, adult: Secondary | ICD-10-CM

## 2023-09-10 DIAGNOSIS — Z8719 Personal history of other diseases of the digestive system: Secondary | ICD-10-CM

## 2023-09-10 DIAGNOSIS — I872 Venous insufficiency (chronic) (peripheral): Secondary | ICD-10-CM | POA: Diagnosis present

## 2023-09-10 DIAGNOSIS — E785 Hyperlipidemia, unspecified: Secondary | ICD-10-CM | POA: Diagnosis present

## 2023-09-10 DIAGNOSIS — R0682 Tachypnea, not elsewhere classified: Secondary | ICD-10-CM | POA: Diagnosis present

## 2023-09-10 DIAGNOSIS — Z87891 Personal history of nicotine dependence: Secondary | ICD-10-CM

## 2023-09-10 DIAGNOSIS — T461X5A Adverse effect of calcium-channel blockers, initial encounter: Secondary | ICD-10-CM | POA: Diagnosis not present

## 2023-09-10 DIAGNOSIS — R000 Tachycardia, unspecified: Secondary | ICD-10-CM | POA: Diagnosis present

## 2023-09-10 DIAGNOSIS — Y92239 Unspecified place in hospital as the place of occurrence of the external cause: Secondary | ICD-10-CM | POA: Diagnosis not present

## 2023-09-10 DIAGNOSIS — G4733 Obstructive sleep apnea (adult) (pediatric): Secondary | ICD-10-CM | POA: Diagnosis present

## 2023-09-10 DIAGNOSIS — Z602 Problems related to living alone: Secondary | ICD-10-CM | POA: Diagnosis present

## 2023-09-10 DIAGNOSIS — Z66 Do not resuscitate: Secondary | ICD-10-CM | POA: Diagnosis not present

## 2023-09-10 DIAGNOSIS — D693 Immune thrombocytopenic purpura: Secondary | ICD-10-CM | POA: Diagnosis present

## 2023-09-10 DIAGNOSIS — M1990 Unspecified osteoarthritis, unspecified site: Secondary | ICD-10-CM | POA: Diagnosis present

## 2023-09-10 DIAGNOSIS — J90 Pleural effusion, not elsewhere classified: Secondary | ICD-10-CM | POA: Diagnosis not present

## 2023-09-10 DIAGNOSIS — Z886 Allergy status to analgesic agent status: Secondary | ICD-10-CM

## 2023-09-10 DIAGNOSIS — Z8619 Personal history of other infectious and parasitic diseases: Secondary | ICD-10-CM

## 2023-09-10 DIAGNOSIS — Z86711 Personal history of pulmonary embolism: Secondary | ICD-10-CM | POA: Diagnosis not present

## 2023-09-10 DIAGNOSIS — L409 Psoriasis, unspecified: Secondary | ICD-10-CM | POA: Diagnosis present

## 2023-09-10 DIAGNOSIS — Z860100 Personal history of colon polyps, unspecified: Secondary | ICD-10-CM

## 2023-09-10 DIAGNOSIS — Z823 Family history of stroke: Secondary | ICD-10-CM

## 2023-09-10 DIAGNOSIS — I77810 Thoracic aortic ectasia: Secondary | ICD-10-CM | POA: Diagnosis present

## 2023-09-10 LAB — CBC
HCT: 38.7 % — ABNORMAL LOW (ref 39.0–52.0)
Hemoglobin: 12.4 g/dL — ABNORMAL LOW (ref 13.0–17.0)
MCH: 33.1 pg (ref 26.0–34.0)
MCHC: 32 g/dL (ref 30.0–36.0)
MCV: 103.2 fL — ABNORMAL HIGH (ref 80.0–100.0)
Platelets: 178 10*3/uL (ref 150–400)
RBC: 3.75 MIL/uL — ABNORMAL LOW (ref 4.22–5.81)
RDW: 13.9 % (ref 11.5–15.5)
WBC: 3.9 10*3/uL — ABNORMAL LOW (ref 4.0–10.5)
nRBC: 0 % (ref 0.0–0.2)

## 2023-09-10 LAB — TROPONIN I (HIGH SENSITIVITY): Troponin I (High Sensitivity): 27 ng/L — ABNORMAL HIGH (ref <18)

## 2023-09-10 LAB — BRAIN NATRIURETIC PEPTIDE: B Natriuretic Peptide: 938.1 pg/mL — ABNORMAL HIGH (ref 0.0–100.0)

## 2023-09-10 LAB — COMPREHENSIVE METABOLIC PANEL WITH GFR
ALT: <5 U/L (ref 0–44)
AST: 19 U/L (ref 15–41)
Albumin: 3.5 g/dL (ref 3.5–5.0)
Alkaline Phosphatase: 56 U/L (ref 38–126)
Anion gap: 9 (ref 5–15)
BUN: 21 mg/dL (ref 8–23)
CO2: 26 mmol/L (ref 22–32)
Calcium: 9 mg/dL (ref 8.9–10.3)
Chloride: 106 mmol/L (ref 98–111)
Creatinine, Ser: 1.23 mg/dL (ref 0.61–1.24)
GFR, Estimated: >60 mL/min (ref >60)
Glucose, Bld: 105 mg/dL — ABNORMAL HIGH (ref 70–99)
Potassium: 4.1 mmol/L (ref 3.5–5.1)
Sodium: 141 mmol/L (ref 135–145)
Total Bilirubin: 0.9 mg/dL (ref 0.0–1.2)
Total Protein: 6.7 g/dL (ref 6.5–8.1)

## 2023-09-10 LAB — RESP PANEL BY RT-PCR (RSV, FLU A&B, COVID)  RVPGX2
Influenza A by PCR: NEGATIVE
Influenza B by PCR: NEGATIVE
Resp Syncytial Virus by PCR: NEGATIVE
SARS Coronavirus 2 by RT PCR: NEGATIVE

## 2023-09-10 LAB — HEPATIC FUNCTION PANEL
ALT: <5 U/L (ref 0–44)
AST: 20 U/L (ref 15–41)
Albumin: 3.4 g/dL — ABNORMAL LOW (ref 3.5–5.0)
Alkaline Phosphatase: 55 U/L (ref 38–126)
Bilirubin, Direct: 0.2 mg/dL (ref 0.0–0.2)
Indirect Bilirubin: 0.7 mg/dL (ref 0.3–0.9)
Total Bilirubin: 0.9 mg/dL (ref 0.0–1.2)
Total Protein: 6.6 g/dL (ref 6.5–8.1)

## 2023-09-10 LAB — IRON AND TIBC
Iron: 58 ug/dL (ref 45–182)
Saturation Ratios: 18 % (ref 17.9–39.5)
TIBC: 319 ug/dL (ref 250–450)
UIBC: 261 ug/dL

## 2023-09-10 LAB — PROTIME-INR
INR: 2.7 — ABNORMAL HIGH (ref 0.8–1.2)
Prothrombin Time: 29 s — ABNORMAL HIGH (ref 11.4–15.2)

## 2023-09-10 LAB — FERRITIN: Ferritin: 223 ng/mL (ref 24–336)

## 2023-09-10 LAB — PHOSPHORUS: Phosphorus: 3.5 mg/dL (ref 2.5–4.6)

## 2023-09-10 LAB — MAGNESIUM: Magnesium: 2.2 mg/dL (ref 1.7–2.4)

## 2023-09-10 LAB — TSH: TSH: 4.37 u[IU]/mL (ref 0.350–4.500)

## 2023-09-10 LAB — OSMOLALITY: Osmolality: 304 mosm/kg — ABNORMAL HIGH (ref 275–295)

## 2023-09-10 MED ORDER — DILTIAZEM HCL 25 MG/5ML IV SOLN
15.0000 mg | Freq: Once | INTRAVENOUS | Status: AC
  Administered 2023-09-10: 15 mg via INTRAVENOUS
  Filled 2023-09-10: qty 5

## 2023-09-10 MED ORDER — FUROSEMIDE 10 MG/ML IJ SOLN
40.0000 mg | Freq: Once | INTRAMUSCULAR | Status: DC
  Filled 2023-09-10: qty 4

## 2023-09-10 NOTE — Assessment & Plan Note (Signed)
 Contributing to comorbidity and complicating medical management     Nutritional follow up as an out pt would be recommended

## 2023-09-10 NOTE — ED Triage Notes (Signed)
 Pt reports SHOB and BLE swelling. Denies hx of CHF. Denies CP. Hx of DVT, on Coumadin. Pt able to speak in short sentences.

## 2023-09-10 NOTE — ED Notes (Signed)
 Patient assisted to sit at bedside in a recliner due to [facility] when laying/sitting in bed

## 2023-09-10 NOTE — Assessment & Plan Note (Signed)
-   Pt diagnosed with CHF based on presence of the following:  PND,   rales on exam  cardiomegaly, Pulmonary edema on CXR, and   bilateral leg edema, DOE, tachycardia (hr>120),  pleural effusion  With noted response to IV diuretici ER  admit on telemetry,  cycle cardiac enzymes, Cardiac Panel (last 3 results)     obtain serial ECG  to evaluate for ischemia as a cause of heart failure  monitor daily weight: There were no vitals filed for this visit. Last BNP BNP (last 3 results) Recent Labs    09/10/23 1928  BNP 938.1*        diuresed with IV lasix   40 mg IV  in ER will monitor BP and monitor orthostatics and creatinine to avoid over diuresis.  Order echogram to evaluate EF and valves  ACE/ARBi   Contraindicated    cardiology consulted

## 2023-09-10 NOTE — Assessment & Plan Note (Signed)
 Cont Coumadin with goals of INR 2-3

## 2023-09-10 NOTE — ED Provider Triage Note (Signed)
 Emergency Medicine Provider Triage Evaluation Note  LAWYER WASHABAUGH , a 78 y.o. male  was evaluated in triage.  Pt complains of shortness of breath, BLE edema. Hx of chf. Denies CP. Does have hx of DVT, is on coumadin currently. Denies hemoptysis.  Review of Systems  Positive: Shob, leg swellign Negative: Chest pain  Physical Exam  BP 115/77 (BP Location: Right Arm)   Pulse (!) 135   Temp 97.6 F (36.4 C)   Resp (!) 24   SpO2 96%  Gen:   Awake, no distress   Resp:  Increased effort, rales and rhonchi throughout, tachypnea MSK:   Moves extremities without difficulty  Other:  Tachycardic irregular hr  Medical Decision Making  Medically screening exam initiated at 7:10 PM.  Appropriate orders placed.  Burnard Bunting was informed that the remainder of the evaluation will be completed by another provider, this initial triage assessment does not replace that evaluation, and the importance of remaining in the ED until their evaluation is complete.  Workup initiated in triage    [place] 09/10/23 1911

## 2023-09-10 NOTE — ED Provider Notes (Signed)
 [place] EMERGENCY DEPARTMENT AT [facility] Provider Note   CSN: [id number] Arrival date & time: 09/10/23  1853     History  Chief Complaint  Patient presents with   Shortness of Breath   Leg Swelling    Stanley Wright is a 78 y.o. male with A-fib on Coumadin, factor V Leiden mutation, history of DVT/PE, sleep apnea, obesity, GERD who presents with shortness of breath, BLE edema that has been worsening over the last week.  Worsened especially today.  Positive orthopnea. Denies CP. Does have hx of DVT/PE, is on coumadin currently. Denies hemoptysis.  Patient does take Lasix but states he missed a couple doses last week.  He has not missed any doses of his Cardizem which he takes for A-fib.  Accompanied by his friend at bedside.  Past Medical History:  Diagnosis Date   Arthritis    BENIGN PROSTATIC HYPERTROPHY, WITH OBSTRUCTION    Cancer (HCC)    skin, basal, squamous   COLONIC POLYPS    Diverticulitis    DVT (deep venous thrombosis) (HCC) 2000   Dysrhythmia    Hx Afib- 2017   Early cataract    Factor 5 Leiden mutation, heterozygous (HCC)    GERD (gastroesophageal reflux disease)    not on medication   Hearing aid worn    B/L   HIATAL HERNIA    ITP (idiopathic thrombocytopenic purpura) 2003   Long term current use of anticoagulant    Osteoarthritis    right foot   Parkinson's disease (HCC) 02/03/2018   PSORIASIS    Pulmonary embolus (HCC) 2000   Shortness of breath dyspnea    at times - Talking alot   Sleep apnea    Wears glasses        Home Medications Prior to Admission medications   Medication Sig Start Date End Date Taking? Authorizing Provider  carbidopa-levodopa (SINEMET CR) 50-200 MG tablet TAKE 1 TABLET BY MOUTH AT BEDTIME 06/04/23  Yes Tat, Rebecca S, DO  carbidopa-levodopa (SINEMET IR) 25-100 MG tablet TAKE 2 TABLETS BY MOUTH AT 7 THEN 1 TABLET AT 10 AM AND 1 TABLET AT 1 PM THEN 1 TABLET AT 4 PM  THEN 1 TABLET AT 7Pm 08/13/23  Yes Tat,  Octaviano Batty, DO  clonazePAM (KLONOPIN) 0.5 MG tablet TAKE 1/2 (ONE-HALF) TABLET BY MOUTH AT BEDTIME 05/11/23  Yes Tat, Octaviano Batty, DO  diltiazem (CARDIZEM CD) 360 MG 24 hr capsule Take 1 capsule (360 mg total) by mouth daily. 03/13/23  Yes Wittenborn, Gavin Pound, NP  furosemide (LASIX) 40 MG tablet Take 1.5 tablets (60 mg total) by mouth daily. 06/30/23 06/29/24 Yes Wittenborn, Deborah, NP  hydroxychloroquine (PLAQUENIL) 200 MG tablet Take 200 mg by mouth once a week.   Yes [provider]  pramipexole (MIRAPEX) 0.25 MG tablet TAKE 1 TABLET BY MOUTH AT BEDTIME 08/13/23  Yes Tat, Rebecca S, DO  warfarin (COUMADIN) 5 MG tablet TAKE 1 TO 2 TABLETS BY MOUTH ONCE DAILY OR  AS  DIRECTED  BY  COUMADIN  CLINIC Patient taking differently: Take 5-10 mg by mouth See admin instructions. Take 2 tablets by mouth every Sun and Wed, then take 1 tablet all other days OR  AS  DIRECTED  BY  COUMADIN  CLINIC 09/29/22  Yes Lewayne Bunting, MD      Allergies    Tylenol [acetaminophen]    Review of Systems   Review of Systems A 10 point review of systems was performed and is negative unless  otherwise reported in HPI.  Physical Exam Updated Vital Signs BP 95/67   Pulse 93   Temp 97.6 F (36.4 C)   Resp (!) 29   SpO2 98%  Physical Exam General: Elderly appearing male, lying in bed.  HEENT: PERRLA, Sclera anicteric, MMM, trachea midline.  Cardiology: Irregularly irregular and tachycardic rhythm, no murmurs/rubs/gallops. Resp: Tachypneic with mildly increased WOB, talking in short sentences.  No abnormal breath sounds, no wheezing or rhonchi.  Abd: Soft, non-tender, non-distended. No rebound tenderness or guarding.  GU: Deferred. MSK: 4+ pitting edema in BL LEs. No signs of trauma. Extremities without deformity or TTP. No cyanosis or clubbing. Skin: warm, dry.  Neuro: A&Ox4, CNs II-XII grossly intact. MAEs. Sensation grossly intact.  Psych: Normal mood and affect.   ED Results / Procedures / Treatments    Labs (all labs ordered are listed, but only abnormal results are displayed) Labs Reviewed  COMPREHENSIVE METABOLIC PANEL WITH GFR - Abnormal; Notable for the following components:      Result Value   Glucose, Bld 105 (*)    All other components within normal limits  CBC - Abnormal; Notable for the following components:   WBC 3.9 (*)    RBC 3.75 (*)    Hemoglobin 12.4 (*)    HCT 38.7 (*)    MCV 103.2 (*)    All other components within normal limits  BRAIN NATRIURETIC PEPTIDE - Abnormal; Notable for the following components:   B Natriuretic Peptide 938.1 (*)    All other components within normal limits  RESP PANEL BY RT-PCR (RSV, FLU A&B, COVID)  RVPGX2  PROTIME-INR  MAGNESIUM  PHOSPHORUS  HEPATIC FUNCTION PANEL  VITAMIN B12  FOLATE  IRON AND TIBC  FERRITIN  URINALYSIS, COMPLETE (UACMP) WITH MICROSCOPIC  PREALBUMIN  SODIUM, URINE, RANDOM  OSMOLALITY, URINE  OSMOLALITY  CREATININE, URINE, RANDOM  TSH  RETICULOCYTES  TROPONIN I (HIGH SENSITIVITY)    EKG EKG Interpretation Date/Time:  Thursday September 10 2023 19:11:06 EDT Ventricular Rate:  131 PR Interval:    QRS Duration:  86 QT Interval:  300 QTC Calculation: 443 R Axis:   182  Text Interpretation: ** Suspect arm lead reversal, interpretation assumes no reversal Atrial fibrillation with rapid ventricular response Right ventricular hypertrophy with repolarization abnormality Anterolateral infarct , age undetermined Confirmed by Vivi Barrack [phone]) on 09/10/2023 8:27:05 PM  Radiology DG Chest Port 1 View Result Date: 09/10/2023 CLINICAL DATA:  Shortness of breath and leg swelling. EXAM: PORTABLE CHEST 1 VIEW COMPARISON:  12/25/2017 FINDINGS: Shallow inspiration. Cardiac enlargement. Pulmonary vascular congestion. Developing small effusions and basilar atelectasis or infiltration. This could indicate compressive atelectasis or edema. Pneumonia would be another possibility. Old right rib fractures. Mediastinal contours  appear intact. IMPRESSION: Cardiac enlargement with pulmonary vascular congestion and infiltration in the lung bases. Small bilateral pleural effusions. Electronically Signed   By: Burman Nieves M.D.   On: 09/10/2023 20:29    Procedures .Critical Care  Performed by: Loetta Rough, MD Authorized by: Loetta Rough, MD   Critical care provider statement:    Critical care time (minutes):  35   Critical care was necessary to treat or prevent imminent or life-threatening deterioration of the following conditions:  Circulatory failure and respiratory failure   Critical care was time spent personally by me on the following activities:  Development of treatment plan with patient or surrogate, discussions with consultants, evaluation of patient's response to treatment, examination of patient, ordering and review of laboratory studies, ordering and  review of radiographic studies, ordering and performing treatments and interventions, pulse oximetry, re-evaluation of patient's condition, review of old charts and obtaining history from patient or surrogate   Care discussed with: admitting provider       Medications Ordered in ED Medications  furosemide (LASIX) injection 40 mg (has no administration in time range)  diltiazem (CARDIZEM) injection 15 mg (15 mg Intravenous Given 09/10/23 2033)    ED Course/ Medical Decision Making/ A&P                          Medical Decision Making Amount and/or Complexity of Data Reviewed Labs: ordered. Decision-making details documented in ED Course. Radiology: ordered. Decision-making details documented in ED Course.  Risk Prescription drug management. Decision regarding hospitalization.    This patient presents to the ED for concern of shortness of breath, leg swelling, this involves an extensive number of treatment options, and is a complaint that carries with it a high risk of complications and morbidity.  I considered the following differential and  admission for this acute, potentially life threatening condition.   MDM:    Patient presents with chest x-ray showing vascular congestion and pulmonary edema, increased bilateral lower extremity edema, orthopnea, and BNP elevated, consistent with congestive heart failure exacerbation.  Possibly due to missing doses of Lasix at home.  He does not have any leukocytosis or fever to indicate pneumonia.  Patient also in A-fib with RVR.  He does take Cardizem at home and has not missed any doses of this.  His blood pressure is 109/83, and I am concerned after both diltiazem bolus as well as Lasix he will need blood pressure support.  Also patient with mild increased work of breathing and tachypnea, believe he would be better suited for admission.  Reassuringly he is not requiring oxygen satting 97% on room air.  Clinical Course as of 09/10/23 2253  Thu Sep 10, 2023  2026 Comprehensive metabolic panel(!) wnl [HN]  2026 Resp panel by RT-PCR (RSV, Flu A&B, Covid) Anterior Nasal Swab neg [HN]  2035 DG Chest Port 1 View Cardiac enlargement with pulmonary vascular congestion and infiltration in the lung bases. Small bilateral pleural effusions.   [HN]  2035 Potassium: 4.1 K okay, can give lasix [HN]  2106 Pulse Rate(!): 110 HR decreased w/ diltiazem drip but BP dropped to 89/70 [HN]  2110 B Natriuretic Peptide(!): 938.1 Elevated [HN]  2110 D/w cardiology who states they can transition to metoprolol inpatient as will have less effect on BP [HN]  2140 BP: 105/66 Improve dBP [HN]    Clinical Course User Index [HN] Loetta Rough, MD    Labs: I Ordered, and personally interpreted labs.  The pertinent results include:  those listed above  Imaging Studies ordered: I ordered imaging studies including CXR I independently visualized and interpreted imaging. I agree with the radiologist interpretation  Additional history obtained from chart review.    Cardiac Monitoring: The patient was  maintained on a cardiac monitor.  I personally viewed and interpreted the cardiac monitored which showed an underlying rhythm of: Afib w/ RVR  Reevaluation: After the interventions noted above, I reevaluated the patient and found that they have :improved  Social Determinants of Health: Lives independently  Disposition:  Admit to hospitalist  Co morbidities that complicate the patient evaluation  Past Medical History:  Diagnosis Date   Arthritis    BENIGN PROSTATIC HYPERTROPHY, WITH OBSTRUCTION    Cancer (HCC)  skin, basal, squamous   COLONIC POLYPS    Diverticulitis    DVT (deep venous thrombosis) (HCC) 2000   Dysrhythmia    Hx Afib- 2017   Early cataract    Factor 5 Leiden mutation, heterozygous (HCC)    GERD (gastroesophageal reflux disease)    not on medication   Hearing aid worn    B/L   HIATAL HERNIA    ITP (idiopathic thrombocytopenic purpura) 2003   Long term current use of anticoagulant    Osteoarthritis    right foot   Parkinson's disease (HCC) 02/03/2018   PSORIASIS    Pulmonary embolus (HCC) 2000   Shortness of breath dyspnea    at times - Talking alot   Sleep apnea    Wears glasses      Medicines Meds ordered this encounter  Medications   diltiazem (CARDIZEM) injection 15 mg   furosemide (LASIX) injection 40 mg    I have reviewed the patients home medicines and have made adjustments as needed  Problem List / ED Course: Problem List Items Addressed This Visit   None Visit Diagnoses       Acute on chronic combined systolic and diastolic congestive heart failure (HCC)    -  Primary   Relevant Medications   diltiazem (CARDIZEM) injection 15 mg (Completed)   furosemide (LASIX) injection 40 mg     Dyspnea, unspecified type         Atrial fibrillation with rapid ventricular response (HCC)       Relevant Medications   diltiazem (CARDIZEM) injection 15 mg (Completed)   furosemide (LASIX) injection 40 mg                   This note  was created using dictation software, which may contain spelling or grammatical errors.    Loetta Rough, MD 09/10/23 [phone]

## 2023-09-10 NOTE — Assessment & Plan Note (Addendum)
 Appreciate cardiology consult Pt had dropped bp after one dose of Dilatiazem IV  Cardiology rec changing to BB Cont coumadin would rec INR 2-3

## 2023-09-10 NOTE — Subjective & Objective (Signed)
 Patient presents for shortness of breath and leg swelling no prior history of heart failure has known history of DVT on Coumadin Reports bilateral leg edema has been worsening over past week unable to lay flat he is not aware of any history of heart failure but does take Lasix Patient has known history of factor V Leiden mutation And ITP he also has known history of dyspnea worse with minimal exertion

## 2023-09-10 NOTE — Assessment & Plan Note (Signed)
 Chronic followed by Neurology would cont home meds

## 2023-09-10 NOTE — H&P (Signed)
 Stanley Wright WUJ:[id number] DOB: [birth year] DOA: 09/10/2023     PCP: [facility], [facility]   Outpatient Specialists:   CARDS:  Dr. Olga Millers, MD  NEphrology: *  Dr. No care team member to display  NEurology    Dr. Loleta Chance Pulmonary *  Dr.  Oncology * Dr.No care team member to display  GI* Dr.  Deboraha Sprang, [facility]) No care team member to display Urology Dr. *  Patient arrived to ER on 09/10/23 at 1853 Referred by Attending Loetta Rough, MD   Patient coming from:    home Lives alone,   *** With family From facility ***    Chief Complaint:   Chief Complaint  Patient presents with   Shortness of Breath   Leg Swelling    HPI: Stanley Wright is a 78 y.o. male with medical history significant of A-fib on Coumadin, DVT PE x2  factor V Leiden deficiency on Coumadin goal 2-3, Parkinson disease, AAA    Presented with legs swelling and worsening dyspnea Patient presents for shortness of breath and leg swelling no prior history of heart failure has known history of DVT on Coumadin Reports bilateral leg edema has been worsening over past week unable to lay flat he is not aware of any history of heart failure but does take Lasix Patient has known history of factor V Leiden mutation And ITP he also has known history of dyspnea worse with minimal exertion      Denies significant ETOH intake *** Does not smoke*** but interested in quitting***  Lab Results  Component Value Date   SARSCOV2NAA NEGATIVE 09/10/2023   SARSCOV2NAA NEGATIVE 08/22/2019        Regarding pertinent Chronic problems:    Hyperlipidemia - not on statins   Lipid Panel     Component Value Date/Time   CHOL 205 (H) 06/12/2015 1505   TRIG 274 (H) 06/12/2015 1505   HDL 46 06/12/2015 1505   CHOLHDL 4.5 06/12/2015 1505   VLDL 55 (H) 06/12/2015 1505   LDLCALC 104 06/12/2015 1505     HTN on diltiazem Lasix   chronic CHF diastolic/systolic/ combined - last echo  Recent Results (from the past 95621  hours)  ECHOCARDIOGRAM COMPLETE   Collection Time: 12/02/22  8:50 AM  Result Value   S' Lateral 3.90   Single Plane A2C EF 49.1   Single Plane A4C EF 42.5   Calc EF 46.4   Est EF 45 - 50%   Narrative      ECHOCARDIOGRAM REPORT       Patient Name:   Stanley Wright Date of Exam: 12/02/2022 Medical Rec #:  [id number]       Height:       71.0 in Accession #:    [id number]      Weight:       240.2 lb Date of Birth:  14-Nov-1945       BSA:          2.279 m Patient Age:    77 years        BP:           112/62 mmHg Patient Gender: M               HR:           97 bpm. Exam Location:  [address]  Procedure: 2D Echo, Cardiac Doppler and Color Doppler  Indications:    Atrial Fibrillation I48.91   History:  Patient has prior history of Echocardiogram examinations, most                 recent 03/12/2018.   Sonographer:    Thurman Coyer RDCS Referring Phys: [id number] BRIAN S CRENSHAW  IMPRESSIONS    1. Left ventricular ejection fraction, by estimation, is 45 to 50%. The left ventricle has mildly decreased function. The left ventricle demonstrates global hypokinesis. Left ventricular diastolic parameters are indeterminate.  2. Right ventricular systolic function is mildly reduced. The right ventricular size is normal. There is normal pulmonary artery systolic pressure. The estimated right ventricular systolic pressure is 21.1 mmHg.  3. Left atrial size was mildly dilated.  4. The mitral valve is normal in structure. No evidence of mitral valve regurgitation. No evidence of mitral stenosis.  5. The aortic valve is tricuspid. Aortic valve regurgitation is not visualized. No aortic stenosis is present.  6. Aortic dilatation noted. There is dilatation of the aortic root, measuring 40 mm. There is dilatation of the ascending aorta, measuring 40 mm.  7. The inferior vena cava is dilated in size with >50% respiratory variability, suggesting right atrial pressure of 8 mmHg.             obesity-   BMI Readings from Last 1 Encounters:  05/07/23 31.87 kg/m        A. Fib -   atrial fibrillation CHA2DS2 vas score  5   current  on anticoagulation with   Coumadin             -  Rate control:  Currently controlled with Diltiazem,            Hx of DVT/PE on - anticoagulation with   Coumadin   Description   Do not take any warfarin today and no warfarin tomorrow then START taking Warfarin 1 tablet (5mg ) daily EXCEPT 2 tablets on Sundays and Wednesdays. Recheck INR in 2 weeks.  [facility] [phone]         While in ER: Clinical Course as of 09/10/23 2158  Thu Sep 10, 2023  2026 Comprehensive metabolic panel(!) wnl [HN]  2026 Resp panel by RT-PCR (RSV, Flu A&B, Covid) Anterior Nasal Swab neg [HN]  2035 DG Chest Port 1 View Cardiac enlargement with pulmonary vascular congestion and infiltration in the lung bases. Small bilateral pleural effusions.   [HN]  2035 Potassium: 4.1 K okay, can give lasix [HN]  2106 Pulse Rate(!): 110 HR decreased w/ diltiazem drip but BP dropped to 89/70 [HN]  2110 B Natriuretic Peptide(!): 938.1 Elevated [HN]  2110 D/w cardiology who states they can transition to metoprolol inpatient as will have less effect on BP [HN]  2140 BP: 105/66 Improve dBP [HN]    Clinical Course User Index [HN] Loetta Rough, MD         Lab Orders         Resp panel by RT-PCR (RSV, Flu A&B, Covid) Anterior Nasal Swab         Comprehensive metabolic panel         CBC         Brain natriuretic peptide      CXR -  Cardiac enlargement with pulmonary vascular congestion and infiltration in the lung bases. Small bilateral pleural effusions.    CTA chest - ***nonacute, no PE, * no evidence of infiltrate  Following Medications were ordered in ER: Medications  furosemide (LASIX) injection 40 mg (has no administration in time range)  diltiazem (CARDIZEM) injection 15  mg (15 mg Intravenous Given 09/10/23 2033)     _______________________________________________________ ER Provider Called:      Cardiology  Dr. Cleon Dew They Recommend admit to medicine   Will see in AM  ***SEEN in ER     ED Triage Vitals  Encounter Vitals Group     BP 09/10/23 1901 115/77     Systolic BP Percentile --      Diastolic BP Percentile --      Pulse Rate 09/10/23 1901 (!) 135     Resp 09/10/23 1901 (!) 24     Temp 09/10/23 1901 97.6 F (36.4 C)     Temp src --      SpO2 09/10/23 1901 96 %     Weight --      Height --      Head Circumference --      Peak Flow --      Pain Score 09/10/23 1903 0     Pain Loc --      Pain Education --      Exclude from Growth Chart --   LKGM(01)@     _________________________________________ Significant initial  Findings: Abnormal Labs Reviewed  COMPREHENSIVE METABOLIC PANEL WITH GFR - Abnormal; Notable for the following components:      Result Value   Glucose, Bld 105 (*)    All other components within normal limits  CBC - Abnormal; Notable for the following components:   WBC 3.9 (*)    RBC 3.75 (*)    Hemoglobin 12.4 (*)    HCT 38.7 (*)    MCV 103.2 (*)    All other components within normal limits  BRAIN NATRIURETIC PEPTIDE - Abnormal; Notable for the following components:   B Natriuretic Peptide 938.1 (*)    All other components within normal limits      _________________________ Troponin ***ordered Cardiac Panel (last 3 results) No results for input(s): "CKTOTAL", "CKMB", "TROPONINIHS", "RELINDX" in the last 72 hours.   ECG: Ordered Personally reviewed and interpreted by me showing: HR : 131 Rhythm: Atrial fibrillation with rapid ventricular response Right ventricular hypertrophy with repolarization abnormality Anterolateral infarct , age undetermined QTC 443  BNP (last 3 results) Recent Labs    09/10/23 1928  BNP 938.1*     COVID-19 Labs  No results for input(s): "DDIMER", "FERRITIN", "LDH", "CRP" in the last 72 hours.  Lab Results  Component Value  Date   SARSCOV2NAA NEGATIVE 09/10/2023   SARSCOV2NAA NEGATIVE 08/22/2019   The recent clinical data is shown below. Vitals:   09/10/23 2100 09/10/23 2115 09/10/23 2120 09/10/23 2130  BP: 92/69 95/64 97/69  105/66  Pulse: (!) 108 (!) 137 92 83  Resp: (!) 29 (!) 29 (!) 32 (!) 33  Temp:      SpO2: (!) 87% 96% 96% 93%   WBC     Component Value Date/Time   WBC 3.9 (L) 09/10/2023 1928   LYMPHSABS 2.2 02/19/2016 0848   MONOABS 0.6 02/19/2016 0848   EOSABS 0.2 02/19/2016 0848   BASOSABS 0.1 02/19/2016 0848        Lactic Acid, Venous    Component Value Date/Time   LATICACIDVEN 1.73 12/23/2017 0323      Lactic Acid, Venous    Component Value Date/Time   LATICACIDVEN 1.73 12/23/2017 0323    Procalcitonin *** Ordered      UA *** no evidence of UTI  ***Pending ***not ordered   Urine analysis:    Component Value Date/Time   COLORURINE AMBER (A)  12/23/2017 0041   APPEARANCEUR CLEAR 12/23/2017 0041   LABSPEC 1.032 (H) 12/23/2017 0041   PHURINE 5.0 12/23/2017 0041   GLUCOSEU NEGATIVE 12/23/2017 0041   HGBUR NEGATIVE 12/23/2017 0041   BILIRUBINUR NEGATIVE 12/23/2017 0041   BILIRUBINUR negative 09/23/2016 1432   BILIRUBINUR neg 06/28/2013 1518   KETONESUR NEGATIVE 12/23/2017 0041   PROTEINUR 30 (A) 12/23/2017 0041   UROBILINOGEN 0.2 09/23/2016 1432   UROBILINOGEN 0.2 12/03/2009 0043   NITRITE NEGATIVE 12/23/2017 0041   LEUKOCYTESUR NEGATIVE 12/23/2017 0041    Results for orders placed or performed during the hospital encounter of 09/10/23  Resp panel by RT-PCR (RSV, Flu A&B, Covid) Anterior Nasal Swab     Status: None   Collection Time: 09/10/23  7:04 PM   Specimen: Anterior Nasal Swab  Result Value Ref Range Status   SARS Coronavirus 2 by RT PCR NEGATIVE NEGATIVE Final   Influenza A by PCR NEGATIVE NEGATIVE Final   Influenza B by PCR NEGATIVE NEGATIVE Final        Resp Syncytial Virus by PCR NEGATIVE NEGATIVE Final          __________________________________________________________ Recent Labs  Lab 09/10/23 1928  NA 141  K 4.1  CO2 26  GLUCOSE 105*  BUN 21  CREATININE 1.23  CALCIUM 9.0    Cr    Up from baseline see below Lab Results  Component Value Date   CREATININE 1.23 09/10/2023   CREATININE 1.06 01/15/2023   CREATININE 0.90 10/30/2022    Recent Labs  Lab 09/10/23 1928  AST 19  ALT <5  ALKPHOS 56  BILITOT 0.9  PROT 6.7  ALBUMIN 3.5   Lab Results  Component Value Date   CALCIUM 9.0 09/10/2023      Plt: Lab Results  Component Value Date   PLT 178 09/10/2023         Recent Labs  Lab 09/10/23 1928  WBC 3.9*  HGB 12.4*  HCT 38.7*  MCV 103.2*  PLT 178    HG/HCT    Down  from baseline see below    Component Value Date/Time   HGB 12.4 (L) 09/10/2023 1928   HGB 14.1 10/30/2022 0814   HCT 38.7 (L) 09/10/2023 1928   HCT 42.2 10/30/2022 0814   MCV 103.2 (H) 09/10/2023 1928   MCV 96 10/30/2022 0814     _______________________________________________ Hospitalist was called for admission for   Acute on chronic combined systolic and diastolic congestive heart failure   Atrial fibrillation with rapid ventricular response (HCC)      The following Work up has been ordered so far:  Orders Placed This Encounter  Procedures   Resp panel by RT-PCR (RSV, Flu A&B, Covid) Anterior Nasal Swab   DG Chest Port 1 View   Comprehensive metabolic panel   CBC   Brain natriuretic peptide   ED Cardiac monitoring   Check Peak Flow   Initiate Carrier Fluid Protocol   Inpatient consult to Cardiology   Consult to hospitalist   ED EKG   Saline lock IV     OTHER Significant initial  Findings:  labs showing:     DM  labs:  HbA1C: No results for input(s): "HGBA1C" in the last 8760 hours.     CBG (last 3)  No results for input(s): "GLUCAP" in the last 72 hours.        Cultures:    Component Value Date/Time   SDES URINE, CLEAN CATCH 12/23/2017 0041   SPECREQUEST NONE  12/23/2017 0041  CULT  12/23/2017 0041    NO GROWTH Performed at [facility], [address] [place], [place] [address]    REPTSTATUS 12/24/2017 FINAL 12/23/2017 0041     Radiological Exams on Admission: DG Chest Port 1 View Result Date: 09/10/2023 CLINICAL DATA:  Shortness of breath and leg swelling. EXAM: PORTABLE CHEST 1 VIEW COMPARISON:  12/25/2017 FINDINGS: Shallow inspiration. Cardiac enlargement. Pulmonary vascular congestion. Developing small effusions and basilar atelectasis or infiltration. This could indicate compressive atelectasis or edema. Pneumonia would be another possibility. Old right rib fractures. Mediastinal contours appear intact. IMPRESSION: Cardiac enlargement with pulmonary vascular congestion and infiltration in the lung bases. Small bilateral pleural effusions. Electronically Signed   By: Burman Nieves M.D.   On: 09/10/2023 20:29   _______________________________________________________________________________________________________ Latest  Blood pressure 105/66, pulse 83, temperature 97.6 F (36.4 C), resp. rate (!) 33, SpO2 93%.   Vitals  labs and radiology finding personally reviewed  Review of Systems:    Pertinent positives include: ***  Constitutional:  No weight loss, night sweats, Fevers, chills, fatigue, weight loss  HEENT:  No headaches, Difficulty swallowing,Tooth/dental problems,Sore throat,  No sneezing, itching, ear ache, nasal congestion, post nasal drip,  Cardio-vascular:  No chest pain, Orthopnea, PND, anasarca, dizziness, palpitations.no Bilateral lower extremity swelling  GI:  No heartburn, indigestion, abdominal pain, nausea, vomiting, diarrhea, change in bowel habits, loss of appetite, melena, blood in stool, hematemesis Resp:  no shortness of breath at rest. No dyspnea on exertion, No excess mucus, no productive cough, No non-productive cough, No coughing up of blood.No change in color of mucus.No wheezing. Skin:  no rash  or lesions. No jaundice GU:  no dysuria, change in color of urine, no urgency or frequency. No straining to urinate.  No flank pain.  Musculoskeletal:  No joint pain or no joint swelling. No decreased range of motion. No back pain.  Psych:  No change in mood or affect. No depression or anxiety. No memory loss.  Neuro: no localizing neurological complaints, no tingling, no weakness, no double vision, no gait abnormality, no slurred speech, no confusion  All systems reviewed and apart from HOPI all are negative _______________________________________________________________________________________________ Past Medical History:   Past Medical History:  Diagnosis Date   Arthritis    BENIGN PROSTATIC HYPERTROPHY, WITH OBSTRUCTION    Cancer (HCC)    skin, basal, squamous   COLONIC POLYPS    Diverticulitis    DVT (deep venous thrombosis) (HCC) 2000   Dysrhythmia    Hx Afib- 2017   Early cataract    Factor 5 Leiden mutation, heterozygous (HCC)    GERD (gastroesophageal reflux disease)    not on medication   Hearing aid worn    B/L   HIATAL HERNIA    ITP (idiopathic thrombocytopenic purpura) 2003   Long term current use of anticoagulant    Osteoarthritis    right foot   Parkinson's disease (HCC) 02/03/2018   PSORIASIS    Pulmonary embolus (HCC) 2000   Shortness of breath dyspnea    at times - Talking alot   Sleep apnea    Wears glasses       Past Surgical History:  Procedure Laterality Date   ANKLE FUSION Right 08/24/2019   Procedure: RIGHT TALO-NAVICULAR FUSION;  Surgeon: Nadara Mustard, MD;  Location: [facility];  Service: Orthopedics;  Laterality: Right;   CARDIOVERSION N/A 11/22/2015   Procedure: CARDIOVERSION;  Surgeon: Thurmon Fair, MD;  Location: [facility] ENDOSCOPY;  Service: Cardiovascular;  Laterality: N/A;  COLONOSCOPY     HERNIA REPAIR Left    Inguinal- x2 . mesh x1   INSERTION OF MESH N/A 02/25/2016   Procedure: INSERTION OF MESH;  Surgeon: Emelia Loron, MD;   Location: [facility] OR;  Service: General;  Laterality: N/A;   LUNG REMOVAL, PARTIAL Right 2004   was fungal and not cancerous   PROSTATE SURGERY     UMBILICAL HERNIA REPAIR N/A 02/25/2016   Procedure: LAPAROSCOPIC UMBILICAL HERNIA REPAIR;  Surgeon: Emelia Loron, MD;  Location: [facility] OR;  Service: General;  Laterality: N/A;    Social History:  Ambulatory *** independently cane, walker  wheelchair bound, bed bound     reports that he quit smoking about 38 years ago. His smoking use included cigarettes. He has never used smokeless tobacco. He reports that he does not drink alcohol and does not use drugs.     Family History:  Family History  Problem Relation Age of Onset   Cancer Father    Breast cancer Sister    Heart disease Brother    Cancer Maternal Grandmother    Stroke Paternal Grandfather    ______________________________________________________________________________________________ Allergies: Allergies  Allergen Reactions   Tylenol [Acetaminophen] Other (See Comments)    Pt states he had a DNA test completed that stated he should never take tylenol.     Prior to Admission medications   Medication Sig Start Date End Date Taking? Authorizing Provider  carbidopa-levodopa (SINEMET CR) 50-200 MG tablet TAKE 1 TABLET BY MOUTH AT BEDTIME 06/04/23   Tat, Rebecca S, DO  carbidopa-levodopa (SINEMET IR) 25-100 MG tablet TAKE 2 TABLETS BY MOUTH AT 7 THEN 1 TABLET AT 10 AM AND 1 TABLET AT 1 PM THEN 1 TABLET AT 4 PM  THEN 1 TABLET AT 7Pm 08/13/23   Tat, Octaviano Batty, DO  clonazePAM (KLONOPIN) 0.5 MG tablet TAKE 1/2 (ONE-HALF) TABLET BY MOUTH AT BEDTIME 05/11/23   Tat, Octaviano Batty, DO  diltiazem (CARDIZEM CD) 360 MG 24 hr capsule Take 1 capsule (360 mg total) by mouth daily. 03/13/23   Carlos Levering, NP  furosemide (LASIX) 40 MG tablet Take 1.5 tablets (60 mg total) by mouth daily. 06/30/23 06/29/24  Carlos Levering, NP  hydroxychloroquine (PLAQUENIL) 200 MG tablet Take 200 mg by mouth  once a week.    [provider]  pramipexole (MIRAPEX) 0.25 MG tablet TAKE 1 TABLET BY MOUTH AT BEDTIME 08/13/23   Tat, Octaviano Batty, DO  warfarin (COUMADIN) 5 MG tablet TAKE 1 TO 2 TABLETS BY MOUTH ONCE DAILY OR  AS  DIRECTED  BY  COUMADIN  CLINIC 09/29/22   Lewayne Bunting, MD    ___________________________________________________________________________________________________ Physical Exam:    09/10/2023    9:30 PM 09/10/2023    9:20 PM 09/10/2023    9:15 PM  Vitals with BMI  Systolic 105 97 95  Diastolic 66 69 64  Pulse 83 92 137     1. General:  in No  Acute distress    Chronically ill   -appearing 2. Psychological: Alert and   Oriented 3. Head/ENT:   Moist   Mucous Membranes                          Head Non traumatic, neck supple                            Poor Dentition 4. SKIN: normal   Skin turgor,  Skin clean  Dry and intact no rash    5. Heart: Regular rate and rhythm no*** Murmur, no Rub or gallop 6. Lungs: ***Clear to auscultation bilaterally, no wheezes or crackles   7. Abdomen: Soft, ***non-tender, Non distended *** obese ***bowel sounds present 8. Lower extremities: no clubbing, cyanosis, no ***edema 9. Neurologically Grossly intact, moving all 4 extremities equally *** strength 5 out of 5 in all 4 extremities cranial nerves II through XII intact 10. MSK: Normal range of motion    Chart has been reviewed  ______________________________________________________________________________________________  Assessment/Plan  78 y.o. male with medical history significant of A-fib on Coumadin, DVT PE x 2  factor V Leiden deficiency on Coumadin goal 2-3, Parkinson disease, AAA  Admitted for Acute on chronic combined systolic and diastolic congestive heart failure   Atrial fibrillation with rapid ventricular response   Present on Admission:  Atrial fibrillation with RVR (HCC)  Factor V Leiden (HCC)  Morbid obesity due to excess calories (HCC)  Parkinson's  disease (HCC)  Acute on chronic systolic CHF (congestive heart failure) (HCC)    Atrial fibrillation with RVR (HCC) Appreciate cardiology consult Pt had dropped bp after one dose of Dilatiazem IV  Cardiology rec changing to BB Cont coumadin would rec INR 2-3  Factor V Leiden (HCC) Cont Coumadin with goals of INR 2-3  Morbid obesity due to excess calories (HCC) Contributing to comorbidity and complicating medical management    Nutritional follow up as an out pt would be recommended   Parkinson's disease (HCC) Chronic followed by Neurology would cont home meds  Acute on chronic systolic CHF (congestive heart failure) (HCC) - Pt diagnosed with CHF based on presence of the following:  PND,   rales on exam  cardiomegaly, Pulmonary edema on CXR, and   bilateral leg edema, DOE, tachycardia (hr>120),  pleural effusion  With noted response to IV diuretici ER  admit on telemetry,  cycle cardiac enzymes, Cardiac Panel (last 3 results)     obtain serial ECG  to evaluate for ischemia as a cause of heart failure  monitor daily weight: There were no vitals filed for this visit. Last BNP BNP (last 3 results) Recent Labs    09/10/23 1928  BNP 938.1*        diuresed with IV lasix   40 mg IV  in ER will monitor BP and monitor orthostatics and creatinine to avoid over diuresis.  Order echogram to evaluate EF and valves  ACE/ARBi   Contraindicated    cardiology consulted    Other plan as per orders.  DVT prophylaxis:  coumadin      Code Status:    Code Status: Prior FULL CODE *** DNR/DNI ***comfort care as per patient ***family  I had personally discussed CODE STATUS with patient and family*  ACP *** none has been reviewed ***   Family Communication:   Family not at  Bedside  plan of care was discussed on the phone with *** Son, Daughter, Wife, Husband, Sister, Brother , father, mother  Diet    Disposition Plan:       To home once workup is complete and patient is stable    Following barriers for discharge:                            Electrolytes corrected  Anemia   stable                                                         Will need consultants to evaluate patient prior to discharge       Consult Orders  (From admission, onward)           Start     Ordered   09/10/23 2142  Consult to hospitalist  Pg by Viviann Spare  Once       Provider:  (Not yet assigned)  Question Answer Comment  Place call to: [facility]   Reason for Consult Admit      09/10/23 2141                              ***Would benefit from PT/OT eval prior to DC  Ordered                   Swallow eval - SLP ordered                   Diabetes care coordinator                   Transition of care consulted                   Nutrition    consulted                  Wound care  consulted                   Palliative care    consulted                   Behavioral health  consulted                    Consults called: ***     Admission status:  ED Disposition     ED Disposition  Admit   Condition  --   Comment  The patient appears reasonably stabilized for admission considering the current resources, flow, and capabilities available in the ED at this time, and I doubt any other [facility] requiring further screening and/or treatment in the ED prior to admission is  present.           Obs***  ***  inpatient     I Expect 2 midnight stay secondary to severity of patient's current illness need for inpatient interventions justified by the following: ***hemodynamic instability despite optimal treatment (tachycardia *hypotension * tachypnea *hypoxia, hypercapnia) * Severe lab/radiological/exam abnormalities including:     and extensive comorbidities including: *substance abuse  *Chronic pain *DM2  * CHF * CAD  * COPD/asthma *Morbid Obesity * CKD *dementia *liver disease *history of stroke with residual deficits *  malignancy, *  sickle cell disease  History of amputation Chronic anticoagulation  That are currently affecting medical management.   I expect  patient to be hospitalized for 2 midnights requiring inpatient medical care.  Patient is at high risk for adverse outcome (such as loss of life or disability) if not treated.  Indication for inpatient stay as follows:  Severe change from baseline regarding mental status Hemodynamic instability despite maximal medical therapy,  ongoing suicidal ideations,  severe pain requiring acute inpatient management,  inability to maintain oral hydration   persistent chest pain despite medical management Need for operative/procedural  intervention New or worsening hypoxia   Need for IV diuretics    Level of care        progressive         tele indefinitely please discontinue once patient no longer qualifies COVID-19 Labs    Lab Results  Component Value Date   SARSCOV2NAA NEGATIVE 09/10/2023     Precautions: admitted as   Covid Negative    Stanley Wright 09/10/2023, 9:58 PM ***  [facility]     after 2 AM please page floor coverage PA If 7AM-7PM, please contact the day team taking care of the patient using [email or web address]

## 2023-09-10 NOTE — ED Notes (Signed)
 MD made aware that Lasix was held due to BP

## 2023-09-11 ENCOUNTER — Observation Stay (HOSPITAL_COMMUNITY)

## 2023-09-11 DIAGNOSIS — I110 Hypertensive heart disease with heart failure: Secondary | ICD-10-CM | POA: Diagnosis not present

## 2023-09-11 DIAGNOSIS — Z602 Problems related to living alone: Secondary | ICD-10-CM | POA: Diagnosis not present

## 2023-09-11 DIAGNOSIS — Z66 Do not resuscitate: Secondary | ICD-10-CM | POA: Diagnosis not present

## 2023-09-11 DIAGNOSIS — R008 Other abnormalities of heart beat: Secondary | ICD-10-CM | POA: Diagnosis not present

## 2023-09-11 DIAGNOSIS — J90 Pleural effusion, not elsewhere classified: Secondary | ICD-10-CM | POA: Diagnosis not present

## 2023-09-11 DIAGNOSIS — G4733 Obstructive sleep apnea (adult) (pediatric): Secondary | ICD-10-CM | POA: Diagnosis not present

## 2023-09-11 DIAGNOSIS — I4821 Permanent atrial fibrillation: Secondary | ICD-10-CM | POA: Diagnosis not present

## 2023-09-11 DIAGNOSIS — G20A1 Parkinson's disease without dyskinesia, without mention of fluctuations: Secondary | ICD-10-CM | POA: Diagnosis not present

## 2023-09-11 DIAGNOSIS — Z7189 Other specified counseling: Secondary | ICD-10-CM | POA: Diagnosis not present

## 2023-09-11 DIAGNOSIS — K219 Gastro-esophageal reflux disease without esophagitis: Secondary | ICD-10-CM | POA: Diagnosis not present

## 2023-09-11 DIAGNOSIS — I429 Cardiomyopathy, unspecified: Secondary | ICD-10-CM | POA: Diagnosis not present

## 2023-09-11 DIAGNOSIS — J189 Pneumonia, unspecified organism: Secondary | ICD-10-CM | POA: Diagnosis not present

## 2023-09-11 DIAGNOSIS — I872 Venous insufficiency (chronic) (peripheral): Secondary | ICD-10-CM | POA: Diagnosis not present

## 2023-09-11 DIAGNOSIS — I4891 Unspecified atrial fibrillation: Secondary | ICD-10-CM | POA: Diagnosis not present

## 2023-09-11 DIAGNOSIS — E66811 Obesity, class 1: Secondary | ICD-10-CM | POA: Diagnosis not present

## 2023-09-11 DIAGNOSIS — I5023 Acute on chronic systolic (congestive) heart failure: Secondary | ICD-10-CM

## 2023-09-11 DIAGNOSIS — I2489 Other forms of acute ischemic heart disease: Secondary | ICD-10-CM | POA: Diagnosis not present

## 2023-09-11 DIAGNOSIS — Z515 Encounter for palliative care: Secondary | ICD-10-CM | POA: Diagnosis not present

## 2023-09-11 DIAGNOSIS — I77810 Thoracic aortic ectasia: Secondary | ICD-10-CM | POA: Diagnosis not present

## 2023-09-11 DIAGNOSIS — Y92239 Unspecified place in hospital as the place of occurrence of the external cause: Secondary | ICD-10-CM | POA: Diagnosis present

## 2023-09-11 DIAGNOSIS — R0989 Other specified symptoms and signs involving the circulatory and respiratory systems: Secondary | ICD-10-CM | POA: Diagnosis not present

## 2023-09-11 DIAGNOSIS — I5043 Acute on chronic combined systolic (congestive) and diastolic (congestive) heart failure: Secondary | ICD-10-CM | POA: Diagnosis not present

## 2023-09-11 DIAGNOSIS — Z1152 Encounter for screening for COVID-19: Secondary | ICD-10-CM | POA: Diagnosis not present

## 2023-09-11 DIAGNOSIS — M1990 Unspecified osteoarthritis, unspecified site: Secondary | ICD-10-CM | POA: Diagnosis not present

## 2023-09-11 DIAGNOSIS — I493 Ventricular premature depolarization: Secondary | ICD-10-CM | POA: Diagnosis not present

## 2023-09-11 DIAGNOSIS — R600 Localized edema: Secondary | ICD-10-CM | POA: Diagnosis not present

## 2023-09-11 DIAGNOSIS — D6851 Activated protein C resistance: Secondary | ICD-10-CM | POA: Diagnosis not present

## 2023-09-11 DIAGNOSIS — D693 Immune thrombocytopenic purpura: Secondary | ICD-10-CM | POA: Diagnosis not present

## 2023-09-11 DIAGNOSIS — R918 Other nonspecific abnormal finding of lung field: Secondary | ICD-10-CM | POA: Diagnosis not present

## 2023-09-11 DIAGNOSIS — M19071 Primary osteoarthritis, right ankle and foot: Secondary | ICD-10-CM | POA: Diagnosis not present

## 2023-09-11 DIAGNOSIS — R000 Tachycardia, unspecified: Secondary | ICD-10-CM | POA: Diagnosis not present

## 2023-09-11 DIAGNOSIS — L409 Psoriasis, unspecified: Secondary | ICD-10-CM | POA: Diagnosis not present

## 2023-09-11 DIAGNOSIS — N400 Enlarged prostate without lower urinary tract symptoms: Secondary | ICD-10-CM | POA: Diagnosis not present

## 2023-09-11 LAB — CBC
HCT: 37 % — ABNORMAL LOW (ref 39.0–52.0)
Hemoglobin: 12 g/dL — ABNORMAL LOW (ref 13.0–17.0)
MCH: 33.4 pg (ref 26.0–34.0)
MCHC: 32.4 g/dL (ref 30.0–36.0)
MCV: 103.1 fL — ABNORMAL HIGH (ref 80.0–100.0)
Platelets: 167 10*3/uL (ref 150–400)
RBC: 3.59 MIL/uL — ABNORMAL LOW (ref 4.22–5.81)
RDW: 13.9 % (ref 11.5–15.5)
WBC: 3.8 10*3/uL — ABNORMAL LOW (ref 4.0–10.5)
nRBC: 0 % (ref 0.0–0.2)

## 2023-09-11 LAB — COMPREHENSIVE METABOLIC PANEL WITH GFR
ALT: 6 U/L (ref 0–44)
AST: 17 U/L (ref 15–41)
Albumin: 3.2 g/dL — ABNORMAL LOW (ref 3.5–5.0)
Alkaline Phosphatase: 54 U/L (ref 38–126)
Anion gap: 11 (ref 5–15)
BUN: 19 mg/dL (ref 8–23)
CO2: 24 mmol/L (ref 22–32)
Calcium: 8.7 mg/dL — ABNORMAL LOW (ref 8.9–10.3)
Chloride: 106 mmol/L (ref 98–111)
Creatinine, Ser: 1.03 mg/dL (ref 0.61–1.24)
GFR, Estimated: >60 mL/min (ref >60)
Glucose, Bld: 98 mg/dL (ref 70–99)
Potassium: 3.8 mmol/L (ref 3.5–5.1)
Sodium: 141 mmol/L (ref 135–145)
Total Bilirubin: 1 mg/dL (ref 0.0–1.2)
Total Protein: 5.7 g/dL — ABNORMAL LOW (ref 6.5–8.1)

## 2023-09-11 LAB — RETICULOCYTES
Immature Retic Fract: 10.8 % (ref 2.3–15.9)
RBC.: 3.54 MIL/uL — ABNORMAL LOW (ref 4.22–5.81)
Retic Count, Absolute: 38.9 10*3/uL (ref 19.0–186.0)
Retic Ct Pct: 1.1 % (ref 0.4–3.1)

## 2023-09-11 LAB — PREALBUMIN: Prealbumin: 14 mg/dL — ABNORMAL LOW (ref 18–38)

## 2023-09-11 LAB — ECHOCARDIOGRAM COMPLETE
Calc EF: 6.6 %
Est EF: <20
S' Lateral: 5.3 cm
Single Plane A2C EF: 1 %
Single Plane A4C EF: 7 %
Weight: 3844.8 [oz_av]

## 2023-09-11 LAB — FOLATE: Folate: 9.6 ng/mL (ref >5.9)

## 2023-09-11 LAB — URINALYSIS, COMPLETE (UACMP) WITH MICROSCOPIC
Bacteria, UA: NONE SEEN
Bilirubin Urine: NEGATIVE
Glucose, UA: NEGATIVE mg/dL
Hgb urine dipstick: NEGATIVE
Ketones, ur: 5 mg/dL — AB
Leukocytes,Ua: NEGATIVE
Nitrite: NEGATIVE
Protein, ur: NEGATIVE mg/dL
Specific Gravity, Urine: 1.023 (ref 1.005–1.030)
pH: 5 (ref 5.0–8.0)

## 2023-09-11 LAB — LACTIC ACID, PLASMA
Lactic Acid, Venous: 2 mmol/L (ref 0.5–1.9)
Lactic Acid, Venous: 1.3 mmol/L (ref 0.5–1.9)

## 2023-09-11 LAB — SODIUM, URINE, RANDOM: Sodium, Ur: 70 mmol/L

## 2023-09-11 LAB — PROTIME-INR
INR: 2.7 — ABNORMAL HIGH (ref 0.8–1.2)
Prothrombin Time: 28.7 s — ABNORMAL HIGH (ref 11.4–15.2)

## 2023-09-11 LAB — MAGNESIUM: Magnesium: 2 mg/dL (ref 1.7–2.4)

## 2023-09-11 LAB — VITAMIN B12: Vitamin B-12: 439 pg/mL (ref 180–914)

## 2023-09-11 LAB — TROPONIN I (HIGH SENSITIVITY): Troponin I (High Sensitivity): 33 ng/L — ABNORMAL HIGH (ref <18)

## 2023-09-11 LAB — OSMOLALITY, URINE: Osmolality, Ur: 767 mosm/kg (ref 300–900)

## 2023-09-11 LAB — PHOSPHORUS: Phosphorus: 3 mg/dL (ref 2.5–4.6)

## 2023-09-11 LAB — CREATININE, URINE, RANDOM: Creatinine, Urine: 212 mg/dL

## 2023-09-11 MED ORDER — WARFARIN SODIUM 5 MG PO TABS: 5.0000 mg | ORAL_TABLET | Freq: Once | ORAL | Status: DC

## 2023-09-11 MED ORDER — FUROSEMIDE 10 MG/ML IJ SOLN
40.0000 mg | Freq: Two times a day (BID) | INTRAMUSCULAR | Status: DC
  Administered 2023-09-11 – 2023-09-13 (×5): 40 mg via INTRAVENOUS
  Filled 2023-09-11 (×5): qty 4

## 2023-09-11 MED ORDER — METOPROLOL TARTRATE 12.5 MG HALF TABLET
12.5000 mg | ORAL_TABLET | Freq: Two times a day (BID) | ORAL | Status: DC
  Administered 2023-09-11: 12.5 mg via ORAL
  Filled 2023-09-11: qty 1

## 2023-09-11 MED ORDER — DIGOXIN 125 MCG PO TABS
0.1250 mg | ORAL_TABLET | Freq: Every day | ORAL | Status: DC
Start: 2023-09-11 — End: 2023-09-14
  Administered 2023-09-11 – 2023-09-13 (×3): 0.125 mg via ORAL
  Filled 2023-09-11 (×3): qty 1

## 2023-09-11 MED ORDER — CARBIDOPA-LEVODOPA ER 50-200 MG PO TBCR
1.0000 | EXTENDED_RELEASE_TABLET | Freq: Every day | ORAL | Status: DC
  Administered 2023-09-11 – 2023-09-12 (×2): 1 via ORAL
  Filled 2023-09-11 (×5): qty 1

## 2023-09-11 MED ORDER — PERFLUTREN LIPID MICROSPHERE
1.0000 mL | INTRAVENOUS | Status: AC | PRN
  Administered 2023-09-11: 4 mL via INTRAVENOUS

## 2023-09-11 MED ORDER — CARBIDOPA-LEVODOPA 25-100 MG PO TABS
1.0000 | ORAL_TABLET | Freq: Four times a day (QID) | ORAL | Status: DC
  Administered 2023-09-11 – 2023-09-13 (×10): 1 via ORAL
  Filled 2023-09-11 (×10): qty 1

## 2023-09-11 MED ORDER — CARBIDOPA-LEVODOPA 25-100 MG PO TABS: 2.0000 | ORAL_TABLET | Freq: Every day | ORAL | Status: DC

## 2023-09-11 MED ORDER — ONDANSETRON HCL 4 MG/2ML IJ SOLN: 4.0000 mg | Freq: Four times a day (QID) | INTRAMUSCULAR | Status: DC | PRN

## 2023-09-11 MED ORDER — HYDROXYCHLOROQUINE SULFATE 200 MG PO TABS: 200.0000 mg | ORAL_TABLET | ORAL | Status: DC

## 2023-09-11 MED ORDER — CARBIDOPA-LEVODOPA 25-100 MG PO TABS
2.0000 | ORAL_TABLET | ORAL | Status: DC
  Administered 2023-09-11 – 2023-09-13 (×3): 2 via ORAL
  Filled 2023-09-11 (×3): qty 2

## 2023-09-11 MED ORDER — ORAL CARE MOUTH RINSE: 15.0000 mL | OROMUCOSAL | Status: DC | PRN

## 2023-09-11 MED ORDER — ONDANSETRON HCL 4 MG PO TABS
4.0000 mg | ORAL_TABLET | Freq: Four times a day (QID) | ORAL | Status: DC | PRN
  Administered 2023-09-11: 4 mg via ORAL
  Filled 2023-09-11: qty 1

## 2023-09-11 MED ORDER — PRAMIPEXOLE DIHYDROCHLORIDE 0.25 MG PO TABS
0.2500 mg | ORAL_TABLET | Freq: Every day | ORAL | Status: DC
  Administered 2023-09-11 – 2023-09-12 (×2): 0.25 mg via ORAL
  Filled 2023-09-11 (×3): qty 1

## 2023-09-11 MED ORDER — WARFARIN - PHARMACIST DOSING INPATIENT: Freq: Every day | Status: DC

## 2023-09-11 MED ORDER — FUROSEMIDE 10 MG/ML IJ SOLN
40.0000 mg | Freq: Once | INTRAMUSCULAR | Status: AC
  Administered 2023-09-11: 40 mg via INTRAVENOUS
  Filled 2023-09-11: qty 4

## 2023-09-11 NOTE — Progress Notes (Signed)
 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    Patient Measurements:   Vital Signs: Temp: 97.6 F (36.4 C) (03/27 1901) BP: 86/69 (03/27 2335) Pulse Rate: 102 (03/27 2335)  Labs: Recent Labs    09/10/23 1928  HGB 12.4*  HCT 38.7*  PLT 178  LABPROT 29.0*  INR 2.7*  CREATININE 1.23  TROPONINIHS 27*    CrCl cannot be calculated (Unknown ideal weight.).  Medications:  -Warfarin 10mg  PO every Sunday, Wednesday and 5mg  PO all other days -Last Coumadin clinic not 3/17 with INR 4.5 (held doses on 3/17, 3/18)  Assessment: 38 yoM presented to ED with leg swelling and worsening dyspnea. Pharmacy consulted to continue dosing warfarin inpatient.  -Hgb 12, plts 178 -INR 2.7 (above goal) -Last dose taken on 3/27 (5mg  tablet)  Goal of Therapy:  INR 2-3 Monitor platelets by anticoagulation protocol: Yes   Plan:  -Warfarin given 3/27, follow up daily INR for continued dosing -INR, CBC daily  Arabella Merles, PharmD. Clinical Pharmacist 09/11/2023 12:24 AM

## 2023-09-11 NOTE — Assessment & Plan Note (Signed)
 Chronic may benefit from ted hose Evidence of chronic venostasis would need to keep ext elevated

## 2023-09-11 NOTE — Plan of Care (Signed)
   Problem: Education: Goal: Knowledge of General Education information will improve Description Including pain rating scale, medication(s)/side effects and non-pharmacologic comfort measures Outcome: Progressing   Problem: Health Behavior/Discharge Planning: Goal: Ability to manage health-related needs will improve Outcome: Progressing

## 2023-09-11 NOTE — Progress Notes (Signed)
 PROGRESS NOTE  Stanley Wright:[id number] DOB: 1945-07-01   PCP: [facility]  Patient is from: Home.  Lives alone.  Uses rolling walker at baseline.  DOA: 09/10/2023 LOS: 0  Chief complaints Chief Complaint  Patient presents with   Shortness of Breath   Leg Swelling     Brief Narrative / Interim history: 78 year old M with PMH of HFmrEF, A-fib/PE/DVT/factor V Leyden deficiency on Coumadin, Parkinson disease and AAA presenting with progressive dyspnea, edema and orthopnea, and admitted with acute on chronic combined CHF and A-fib with RVR to 135.  He was also tachypneic to 20s and 30s.  BNP elevated to 938.  CXR consistent with CHF exacerbation.  Mildly evaded troponin without significant delta.  Became hypotensive after IV Cardizem hence discontinued.  TTE ordered.  Cardiology consulted.   Subjective: Seen and examined earlier this morning.  Sitting on bedside chair.  Continues to endorse shortness of breath and lower extremity edema.  Denies chest pain, GI or UTI symptoms.  Objective: Vitals:   09/11/23 1000 09/11/23 1015 09/11/23 1100 09/11/23 1115  BP: 99/73 96/67 91/73  92/70  Pulse: (!) 119 (!) 106 (!) 101 95  Resp: (!) 31 (!) 38 (!) 30 (!) 24  Temp:      TempSrc:      SpO2: 91% 95% 98% 98%    Examination:  GENERAL: No apparent distress.  Nontoxic. HEENT: MMM.  Vision and hearing grossly intact.  NECK: Flexed forward.  Difficult to assess JVD due to his position/posture. RESP:  No IWOB.  Fair aeration bilaterally. CVS: Irregular.  HR in 120s. Heart sounds normal.  ABD/GI/GU: BS+. Abd soft, NTND.  MSK/EXT:  Moves extremities. No apparent deformity.  Significant BLE edema with underlying venous insufficiency. SKIN: Dry skin in  lower extremities. NEURO: Awake, alert and oriented appropriately.  No apparent focal neuro deficit. PSYCH: Calm. Normal affect.   Consultants:  Cardiology  Procedures: None  Microbiology summarized: COVID-19, influenza  and RSV PCR nonreactive  Assessment and plan: Acute on chronic HFmrEF: TTE in 11/2022 with LVEF of 45 to 50%, GH, indeterminate DD and PA PP of 31.  Presents with SOB, edema and orthopnea.  BNP elevated to 938.  CXR consistent with acute CHF.  He was also in A-fib with RVR.  Unfortunately, BP dropped after Cardizem injection in ED and he did not receive diuretics overnight.  BP improved some this morning. -Appreciate input by cardiology-started Lasix -Low-dose metoprolol and digoxin -Follow TTE -Strict intake and output, daily weight, renal functions and electrolytes  Permanent atrial fibrillation with RVR: HR in 130s on arrival.  Blood pressure dropped with IV Cardizem push.  HR in the low 100s this morning.  Blood pressure improved.  TSH normal. -Appreciate input by cardiology-low-dose metoprolol and digoxin -Hold home Cardizem. -Continue warfarin for anticoagulation.  INR therapeutic. -Optimize electrolytes. -Follow TTE   History of PE/DVT/factor V Leiden deficiency -Continue warfarin  Parkinson's disease: -Continue home Sinemet -Follow-up with neurology  Physical deconditioning: Lives alone.  Uses rolling walker at baseline. -PT/OT  BPH without LUTS: Does not seem to be on medication. - monitor urine output  Psoriasis -Resume home Plaquenil  OSA -Will order CPAP if he wears   Morbid obesity? -Check BMI There is no height or weight on file to calculate BMI.           DVT prophylaxis:  Place TED hose Start: 09/11/23 0139 warfarin (COUMADIN) tablet 5 mg  Code Status: Full code Family Communication: None at bedside Level  of care: Progressive Status is: Inpatient Remains inpatient appropriate because: Acute CHF and A-fib with RVR   Final disposition: To be determined   55 minutes with more than 50% spent in reviewing records, counseling patient/family and coordinating care.   Sch Meds:  Scheduled Meds:  carbidopa-levodopa  1 tablet Oral QHS    carbidopa-levodopa  2 tablet Oral Q24H   And   carbidopa-levodopa  1 tablet Oral QID   digoxin  0.125 mg Oral Daily   furosemide  40 mg Intravenous BID   [START ON 09/16/2023] hydroxychloroquine  200 mg Oral Weekly   metoprolol tartrate  12.5 mg Oral BID   pramipexole  0.25 mg Oral QHS   warfarin  5 mg Oral ONCE-1600   Warfarin - Pharmacist Dosing Inpatient   Does not apply q1600   Continuous Infusions: PRN Meds:.ondansetron **OR** ondansetron (ZOFRAN) IV  Antimicrobials: Anti-infectives (From admission, onward)    Start     Dose/Rate Route Frequency Ordered Stop   09/16/23 1030  hydroxychloroquine (PLAQUENIL) tablet 200 mg        200 mg Oral Weekly 09/11/23 1127          I have personally reviewed the following labs and images: CBC: Recent Labs  Lab 09/10/23 1928 09/11/23 0350  WBC 3.9* 3.8*  HGB 12.4* 12.0*  HCT 38.7* 37.0*  MCV 103.2* 103.1*  PLT 178 167   BMP &GFR Recent Labs  Lab 09/10/23 1928 09/11/23 0350  NA 141 141  K 4.1 3.8  CL 106 106  CO2 26 24  GLUCOSE 105* 98  BUN 21 19  CREATININE 1.23 1.03  CALCIUM 9.0 8.7*  MG 2.2 2.0  PHOS 3.5 3.0   CrCl cannot be calculated (Unknown ideal weight.). Liver & Pancreas: Recent Labs  Lab 09/10/23 1928 09/11/23 0350  AST 20  19 17   ALT <5  <5 6  ALKPHOS 55  56 54  BILITOT 0.9  0.9 1.0  PROT 6.6  6.7 5.7*  ALBUMIN 3.4*  3.5 3.2*   No results for input(s): "LIPASE", "AMYLASE" in the last 168 hours. No results for input(s): "AMMONIA" in the last 168 hours. Diabetic: No results for input(s): "HGBA1C" in the last 72 hours. No results for input(s): "GLUCAP" in the last 168 hours. Cardiac Enzymes: No results for input(s): "CKTOTAL", "CKMB", "CKMBINDEX", "TROPONINI" in the last 168 hours. No results for input(s): "PROBNP" in the last 8760 hours. Coagulation Profile: Recent Labs  Lab 09/10/23 1928 09/11/23 0350  INR 2.7* 2.7*   Thyroid Function Tests: Recent Labs    09/10/23 1928  TSH 4.370    Lipid Profile: No results for input(s): "CHOL", "HDL", "LDLCALC", "TRIG", "CHOLHDL", "LDLDIRECT" in the last 72 hours. Anemia Panel: Recent Labs    09/10/23 1928 09/11/23 0350  VITAMINB12  --  439  FOLATE  --  9.6  FERRITIN 223  --   TIBC 319  --   IRON 58  --   RETICCTPCT  --  1.1   Urine analysis:    Component Value Date/Time   COLORURINE YELLOW 09/11/2023 0423   APPEARANCEUR CLEAR 09/11/2023 0423   LABSPEC 1.023 09/11/2023 0423   PHURINE 5.0 09/11/2023 0423   GLUCOSEU NEGATIVE 09/11/2023 0423   HGBUR NEGATIVE 09/11/2023 0423   BILIRUBINUR NEGATIVE 09/11/2023 0423   BILIRUBINUR negative 09/23/2016 1432   BILIRUBINUR neg 06/28/2013 1518   KETONESUR 5 (A) 09/11/2023 0423   PROTEINUR NEGATIVE 09/11/2023 0423   UROBILINOGEN 0.2 09/23/2016 1432   UROBILINOGEN 0.2 12/03/2009 0043  NITRITE NEGATIVE 09/11/2023 0423   LEUKOCYTESUR NEGATIVE 09/11/2023 0423   Sepsis Labs: Invalid input(s): "PROCALCITONIN", "LACTICIDVEN"  Microbiology: Recent Results (from the past 240 hours)  Resp panel by RT-PCR (RSV, Flu A&B, Covid) Anterior Nasal Swab     Status: None   Collection Time: 09/10/23  7:04 PM   Specimen: Anterior Nasal Swab  Result Value Ref Range Status   SARS Coronavirus 2 by RT PCR NEGATIVE NEGATIVE Final   Influenza A by PCR NEGATIVE NEGATIVE Final   Influenza B by PCR NEGATIVE NEGATIVE Final    Comment: (NOTE) The Xpert Xpress SARS-CoV-2/FLU/RSV plus assay is intended as an aid in the diagnosis of influenza from Nasopharyngeal swab specimens and should not be used as a sole basis for treatment. Nasal washings and aspirates are unacceptable for Xpert Xpress SARS-CoV-2/FLU/RSV testing.  Fact Sheet for Patients: BloggerCourse.com  Fact Sheet for Healthcare Providers: SeriousBroker.it  This test is not yet approved or cleared by the [place] FDA and has been authorized for detection and/or diagnosis of  SARS-CoV-2 by FDA under an Emergency Use Authorization (EUA). This EUA will remain in effect (meaning this test can be used) for the duration of the COVID-19 declaration under Section 564(b)(1) of the Act, 21 U.S.C. section 360bbb-3(b)(1), unless the authorization is terminated or revoked.     Resp Syncytial Virus by PCR NEGATIVE NEGATIVE Final    Comment: (NOTE) Fact Sheet for Patients: BloggerCourse.com  Fact Sheet for Healthcare Providers: SeriousBroker.it  This test is not yet approved or cleared by the [place] FDA and has been authorized for detection and/or diagnosis of SARS-CoV-2 by FDA under an Emergency Use Authorization (EUA). This EUA will remain in effect (meaning this test can be used) for the duration of the COVID-19 declaration under Section 564(b)(1) of the Act, 21 U.S.C. section 360bbb-3(b)(1), unless the authorization is terminated or revoked.  Performed at [facility], [address] [place], [place] [address]     Radiology Studies: DG Chest Port 1 View Result Date: 09/10/2023 CLINICAL DATA:  Shortness of breath and leg swelling. EXAM: PORTABLE CHEST 1 VIEW COMPARISON:  12/25/2017 FINDINGS: Shallow inspiration. Cardiac enlargement. Pulmonary vascular congestion. Developing small effusions and basilar atelectasis or infiltration. This could indicate compressive atelectasis or edema. Pneumonia would be another possibility. Old right rib fractures. Mediastinal contours appear intact. IMPRESSION: Cardiac enlargement with pulmonary vascular congestion and infiltration in the lung bases. Small bilateral pleural effusions. Electronically Signed   By: Burman Nieves M.D.   On: 09/10/2023 20:29      Randon Somera T. Chrisy Hillebrand [facility]  If 7PM-7AM, please contact night-coverage [email or web address] 09/11/2023, 11:28 AM

## 2023-09-11 NOTE — Assessment & Plan Note (Signed)
 On coumadin will continue

## 2023-09-11 NOTE — ED Notes (Signed)
 MD at [facility]

## 2023-09-11 NOTE — ED Notes (Signed)
 Called social work and left VM for [facility] consult

## 2023-09-11 NOTE — ED Notes (Signed)
 Pt alert, NAD, calm, interactive, resps e/u, tachypneic, shallow, orthopneic. Speech clear and mumbles. Afebrile. Afib 113-133. Sitting in recliner, 95% percent RA.

## 2023-09-11 NOTE — Progress Notes (Signed)
 Notified by echo staff that LVEF severely reduced 10-15%. Stop beta blocker. Continue digoxin for rate control. No AV nodal agents. Hold further GDMT due to soft BP. Will check lactic acid and continue with diuresis for now. Cr and liver enzymes normal. Appears warm and wet on exam. Will plan for conservative treatment for now.   Discussed need for right and left heart cath. The patient is open to this. Will stop coumadin and plan to transition to heparin drip once INR <2.0. Tentative plan for [facility] Monday.   Gerri Spore T. Flora Lipps, MD, [facility]  [facility]  [address] [place], [place] [address] [phone]  4:01 PM

## 2023-09-11 NOTE — Assessment & Plan Note (Signed)
Chronic stable continue home meds

## 2023-09-11 NOTE — Progress Notes (Signed)
 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    Patient Measurements:   Vital Signs: Temp: 97.6 F (36.4 C) (03/28 0255) Temp Source: Oral (03/28 0255) BP: 96/69 (03/28 0700) Pulse Rate: 94 (03/28 0700)  Labs: Recent Labs    09/10/23 1928 09/11/23 0350  HGB 12.4* 12.0*  HCT 38.7* 37.0*  PLT 178 167  LABPROT 29.0* 28.7*  INR 2.7* 2.7*  CREATININE 1.23 1.03  TROPONINIHS 27* 33*    CrCl cannot be calculated (Unknown ideal weight.).  Medications:  -Warfarin 10mg  PO every Sunday, Wednesday and 5mg  PO all other days -Last Coumadin clinic not 3/17 with INR 4.5 (held doses on 3/17, 3/18)  Assessment: 37 yoM presented to ED with leg swelling and worsening dyspnea. Pharmacy consulted to continue dosing warfarin inpatient. Last dose taken on 3/27 (5mg  tablet)  PTA dosing: 5mg  daily except 10mg  Sun/Wed  INR therapeutic this AM at 2.7  Goal of Therapy:  INR 2-3 Monitor platelets by anticoagulation protocol: Yes   Plan:  Warfarin 5mg  PO x 1 today Daily INR, s/s bleeding  Daylene Posey, PharmD, [facility] Clinical Pharmacist ED Pharmacist Phone # [phone] 09/11/2023 7:38 AM

## 2023-09-11 NOTE — Consult Note (Addendum)
 Cardiology Consultation   Patient ID: Stanley Wright MRN: [id number]; DOB: [birth year]  Admit date: 09/10/2023 Date of Consult: 09/11/2023  PCP:  Medicine, Jonita Al[facility]   [facility] Providers Cardiologist:  Olga Millers, MD     Patient Profile:   Stanley Wright is a 78 y.o. male with a hx of permanent atrial fibrillation, HFmrEF, OSA on CPAP, factor V Leiden, history of PE/DVT, Parkinson disease, frequent falls who is being seen 09/11/2023 for the evaluation of atrial fibrillation at the request of Dr. Alanda Slim.  History of Present Illness:   Stanley Wright is a 78 year old male with above medical history who is followed by Dr. Jens Som. Patient has Factor V Leiden and has had at lease 2 pulmonary embolisms in the past. He is on chronic coumadin therapy. Patient also has a known history of atrial fibrillation. He underwent successful DCCV in 11/2015, but had recurrence. Has been on diltiazem since. Nuclear stress test in 07/2016 was a normal, low risk study. Echocardiogram in 02/2018 showed EF 55-60%, no regional wall motion abnormalities. Cardiac monitor in 04/2020 showed continuous atrial fibrillation (100% burden), occasional PVCs with 2% burden.   Most recent echocardiogram from 11/2022 showed EF 45-50% with global hypokinesis, mildly reduced RV systolic function, normal PA systolic pressure. 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 He wore a 3 day zio patch in 12/2022 that showed permanent atrial fibrillation (100% burden) with average HR 87 BPM. There were occasional PVCs with 4.9% burden. He remained on diltiazem and coumadin.   Patient presented to the ED on 3/27 complaining of shortness of breath and lower extremity swelling. Initial vital signs significant for HR 135 BPM, respirations 24 breaths per minute, BP 115/77, oxygen 96% on room air. EKG showed atrial fibrillation with HR 131 BPM. COVID, Flu, RSV negative. hsTn  27>33. BNP elevated to 938.1. CXR showed  cardiac enlargement with pulmonary vascular congestion and infiltration in the lung bases, small bilateral pleural effusions.    Patient was initially started on IV diltiazem for HR control. Howe er, developed low BP and IV diltiazem was discontinued. Instead he was started on metoprolol tartate 12.5 mg BID. IV lasix was ordered but was not given due to low BP.   On interview, patient reports that he came to the ED because he had lower extremity swelling and shortness of breath. Also noted orthopnea. Symptoms began about 1 week ago, and have been worsening over time. He denies chest pain, palpitations, dizziness, syncope, near syncope, cough. Denies abdominal distention. He normally weighs himself, but he has not checked his weight since he started to hold onto fluid. Reports that he is always in atrial fibrillation, but does not have any symptoms. Even when his HR is elevated, he cannot tell. He has not received any lasix so far this admission.   Past Medical History:  Diagnosis Date   Arthritis    BENIGN PROSTATIC HYPERTROPHY, WITH OBSTRUCTION    Cancer (HCC)    skin, basal, squamous   COLONIC POLYPS    Diverticulitis    DVT (deep venous thrombosis) (HCC) 2000   Dysrhythmia    Hx Afib- 2017   Early cataract    Factor 5 Leiden mutation, heterozygous (HCC)    GERD (gastroesophageal reflux disease)    not on medication   Hearing aid worn    B/L   HIATAL HERNIA    ITP (idiopathic thrombocytopenic purpura) 2003   Long term current use of anticoagulant    Osteoarthritis  right foot   Parkinson's disease (HCC) 02/03/2018   PSORIASIS    Pulmonary embolus (HCC) 2000   Shortness of breath dyspnea    at times - Talking alot   Sleep apnea    Wears glasses     Past Surgical History:  Procedure Laterality Date   ANKLE FUSION Right 08/24/2019   Procedure: RIGHT TALO-NAVICULAR FUSION;  Surgeon: Nadara Mustard, MD;  Location: [facility] OR;  Service: Orthopedics;  Laterality: Right;   CARDIOVERSION  N/A 11/22/2015   Procedure: CARDIOVERSION;  Surgeon: Thurmon Fair, MD;  Location: [facility] ENDOSCOPY;  Service: Cardiovascular;  Laterality: N/A;   COLONOSCOPY     HERNIA REPAIR Left    Inguinal- x2 . mesh x1   INSERTION OF MESH N/A 02/25/2016   Procedure: INSERTION OF MESH;  Surgeon: Emelia Loron, MD;  Location: [facility] OR;  Service: General;  Laterality: N/A;   LUNG REMOVAL, PARTIAL Right 2004   was fungal and not cancerous   PROSTATE SURGERY     UMBILICAL HERNIA REPAIR N/A 02/25/2016   Procedure: LAPAROSCOPIC UMBILICAL HERNIA REPAIR;  Surgeon: Emelia Loron, MD;  Location: [facility] OR;  Service: General;  Laterality: N/A;     Home Medications:  Prior to Admission medications   Medication Sig Start Date End Date Taking? Authorizing Provider  carbidopa-levodopa (SINEMET CR) 50-200 MG tablet TAKE 1 TABLET BY MOUTH AT BEDTIME 06/04/23  Yes Tat, Rebecca S, DO  carbidopa-levodopa (SINEMET IR) 25-100 MG tablet TAKE 2 TABLETS BY MOUTH AT 7 THEN 1 TABLET AT 10 AM AND 1 TABLET AT 1 PM THEN 1 TABLET AT 4 PM  THEN 1 TABLET AT 7Pm 08/13/23  Yes Tat, Octaviano Batty, DO  clonazePAM (KLONOPIN) 0.5 MG tablet TAKE 1/2 (ONE-HALF) TABLET BY MOUTH AT BEDTIME 05/11/23  Yes Tat, Octaviano Batty, DO  diltiazem (CARDIZEM CD) 360 MG 24 hr capsule Take 1 capsule (360 mg total) by mouth daily. 03/13/23  Yes Wittenborn, Gavin Pound, NP  furosemide (LASIX) 40 MG tablet Take 1.5 tablets (60 mg total) by mouth daily. 06/30/23 06/29/24 Yes Wittenborn, Deborah, NP  hydroxychloroquine (PLAQUENIL) 200 MG tablet Take 200 mg by mouth once a week.   Yes [provider]  pramipexole (MIRAPEX) 0.25 MG tablet TAKE 1 TABLET BY MOUTH AT BEDTIME 08/13/23  Yes Tat, Rebecca S, DO  warfarin (COUMADIN) 5 MG tablet TAKE 1 TO 2 TABLETS BY MOUTH ONCE DAILY OR  AS  DIRECTED  BY  COUMADIN  CLINIC Patient taking differently: Take 5-10 mg by mouth See admin instructions. Take 2 tablets by mouth every Sun and Wed, then take 1 tablet all other days OR  AS  DIRECTED   BY  COUMADIN  CLINIC 09/29/22  Yes Lewayne Bunting, MD    Inpatient Medications: Scheduled Meds:  carbidopa-levodopa  1 tablet Oral QHS   carbidopa-levodopa  2 tablet Oral Q24H   And   carbidopa-levodopa  1 tablet Oral QID   metoprolol tartrate  12.5 mg Oral BID   pramipexole  0.25 mg Oral QHS   warfarin  5 mg Oral ONCE-1600   Warfarin - Pharmacist Dosing Inpatient   Does not apply q1600   Continuous Infusions:  PRN Meds: ondansetron **OR** ondansetron (ZOFRAN) IV  Allergies:    Allergies  Allergen Reactions   Tylenol [Acetaminophen] Other (See Comments)    Pt states he had a DNA test completed that stated he should never take tylenol.    Social History:   Social History   Socioeconomic History   Marital status:  Divorced    Spouse name: Not on file   Number of children: 2   Years of education: Masters   Highest education level: Not on file  Occupational History    Employer: RETIRED    Comment: Emergency planning/management officer  Tobacco Use   Smoking status: Former    Current packs/day: 0.00    Types: Cigarettes    Quit date: 06/16/1985    Years since quitting: 38.2   Smokeless tobacco: Never   Tobacco comments:    quit approx age 68-   Vaping Use   Vaping status: Never Used  Substance and Sexual Activity   Alcohol use: No   Drug use: No   Sexual activity: Yes  Other Topics Concern   Not on file  Social History Narrative   Lives alone   Caffeine use: Coffee daily   Right handed           What is your current occupation?retired   Do you live at home alone?    What type of home do you live in: 1 story or 2 story? one   Caffeine occas    Social Drivers of Health   Financial Resource Strain: Not on file  Food Insecurity: No Food Insecurity (03/13/2022)   Hunger Vital Sign    Worried About Running Out of Food in the Last Year: Never true    Ran Out of Food in the Last Year: Never true  Transportation Needs: Not on file  Physical Activity: Not on file  Stress: Not on  file  Social Connections: Not on file  Intimate Partner Violence: Not on file    Family History:    Family History  Problem Relation Age of Onset   Cancer Father    Breast cancer Sister    Heart disease Brother    Cancer Maternal Grandmother    Stroke Paternal Grandfather      ROS:  Please see the history of present illness.   All other ROS reviewed and negative.     Physical Exam/Data:   Vitals:   09/11/23 0600 09/11/23 0615 09/11/23 0630 09/11/23 0700  BP: 90/71 103/81 103/75 96/69  Pulse: (!) 104 (!) 116 (!) 125 94  Resp: (!) 35 17 (!) 34 (!) 23  Temp:      TempSrc:      SpO2: 94% 91% 93% 94%   No intake or output data in the 24 hours ending 09/11/23 0755    05/07/2023    2:46 PM 04/14/2023    7:42 AM 03/02/2023    3:16 PM  Last 3 Weights  Weight (lbs) 235 lb 235 lb 235 lb  Weight (kg) 106.595 kg 106.595 kg 106.595 kg     There is no height or weight on file to calculate BMI.  General:  Obese, elderly male. Sitting upright in the recliner. No acute distress  HEENT: normal Neck: no JVD Vascular: Radial pulses 2+ bilaterally Cardiac:  normal S1, S2; irregular rate and rhythm, tachycardic. No murmurs  Lungs:  Diminished breath sounds in bilateral lung bases, crackles present on deep inhalation. Tachypneic with RR 24  Abd: soft, nontender, mildly distended  Ext: 2+ edema in BLE, extending from the knees to the feet  Musculoskeletal:  No deformities  Skin: warm and dry  Neuro:   no focal abnormalities noted Psych:  Normal affect   EKG:  The EKG was personally reviewed and demonstrates:  Atrial fibrillation, HR 131 BPM  Telemetry:  Telemetry was personally reviewed and demonstrates:  Atrial fibrillation, HR in the 110s-120s   Relevant CV Studies: Cardiac Studies & Procedures   ______________________________________________________________________________________________   STRESS TESTS  MYOCARDIAL PERFUSION IMAGING 08/07/2016  Narrative  There was no ST  segment deviation noted during stress.  The study is normal.  This is a low risk study.  This study was not gated due to atrial fibrillation.   ECHOCARDIOGRAM  ECHOCARDIOGRAM COMPLETE 12/02/2022  Narrative ECHOCARDIOGRAM REPORT    Patient Name:   Stanley Wright Date of Exam: 12/02/2022 Medical Rec #:  [id number]       Height:       71.0 in Accession #:    [id number]      Weight:       240.2 lb Date of Birth:  Oct 31, 1945       BSA:          2.279 m Patient Age:    77 years        BP:           112/62 mmHg Patient Gender: M               HR:           97 bpm. Exam Location:  [address]  Procedure: 2D Echo, Cardiac Doppler and Color Doppler  Indications:    Atrial Fibrillation I48.91  History:        Patient has prior history of Echocardiogram examinations, most recent 03/12/2018.  Sonographer:    Thurman Coyer RDCS Referring Phys: [id number] BRIAN S CRENSHAW  IMPRESSIONS   1. Left ventricular ejection fraction, by estimation, is 45 to 50%. The left ventricle has mildly decreased function. The left ventricle demonstrates global hypokinesis. Left ventricular diastolic parameters are indeterminate. 2. Right ventricular systolic function is mildly reduced. The right ventricular size is normal. There is normal pulmonary artery systolic pressure. The estimated right ventricular systolic pressure is 21.1 mmHg. 3. Left atrial size was mildly dilated. 4. The mitral valve is normal in structure. No evidence of mitral valve regurgitation. No evidence of mitral stenosis. 5. The aortic valve is tricuspid. Aortic valve regurgitation is not visualized. No aortic stenosis is present. 6. Aortic dilatation noted. There is dilatation of the aortic root, measuring 40 mm. There is dilatation of the ascending aorta, measuring 40 mm. 7. The inferior vena cava is dilated in size with >50% respiratory variability, suggesting right atrial pressure of 8 mmHg.  FINDINGS Left Ventricle: Left  ventricular ejection fraction, by estimation, is 45 to 50%. The left ventricle has mildly decreased function. The left ventricle demonstrates global hypokinesis. The left ventricular internal cavity size was normal in size. There is no left ventricular hypertrophy. Left ventricular diastolic parameters are indeterminate.  Right Ventricle: The right ventricular size is normal. Right vetricular wall thickness was not well visualized. Right ventricular systolic function is mildly reduced. There is normal pulmonary artery systolic pressure. The tricuspid regurgitant velocity is 1.81 m/s, and with an assumed right atrial pressure of 8 mmHg, the estimated right ventricular systolic pressure is 21.1 mmHg.  Left Atrium: Left atrial size was mildly dilated.  Right Atrium: Right atrial size was normal in size.  Pericardium: There is no evidence of pericardial effusion.  Mitral Valve: The mitral valve is normal in structure. No evidence of mitral valve regurgitation. No evidence of mitral valve stenosis.  Tricuspid Valve: The tricuspid valve is normal in structure. Tricuspid valve regurgitation is trivial.  Aortic Valve: The aortic valve is tricuspid. Aortic valve regurgitation is not visualized. No  aortic stenosis is present.  Pulmonic Valve: The pulmonic valve was not well visualized. Pulmonic valve regurgitation is trivial.  Aorta: Aortic dilatation noted. There is dilatation of the aortic root, measuring 40 mm. There is dilatation of the ascending aorta, measuring 40 mm.  Venous: The inferior vena cava is dilated in size with greater than 50% respiratory variability, suggesting right atrial pressure of 8 mmHg.  IAS/Shunts: The interatrial septum was not well visualized.   LEFT VENTRICLE PLAX 2D LVIDd:         4.80 cm LVIDs:         3.90 cm LV PW:         1.00 cm LV IVS:        1.00 cm LVOT diam:     2.30 cm LV SV:         57 LV SV Index:   25 LVOT Area:     4.15 cm  LV Volumes  (MOD) LV vol d, MOD A2C: 95.3 ml LV vol d, MOD A4C: 104.0 ml LV vol s, MOD A2C: 48.5 ml LV vol s, MOD A4C: 59.8 ml LV SV MOD A2C:     46.8 ml LV SV MOD A4C:     104.0 ml LV SV MOD BP:      47.5 ml  RIGHT VENTRICLE RV Basal diam:  3.40 cm RV Mid diam:    2.90 cm RV S prime:     8.26 cm/s TAPSE (M-mode): 1.2 cm  LEFT ATRIUM             Index        RIGHT ATRIUM           Index LA diam:        4.20 cm 1.84 cm/m   RA Area:     20.10 cm LA Vol (A2C):   92.0 ml 40.36 ml/m  RA Volume:   49.50 ml  21.72 ml/m LA Vol (A4C):   84.9 ml 37.25 ml/m LA Biplane Vol: 88.3 ml 38.74 ml/m AORTIC VALVE LVOT Vmax:   76.70 cm/s LVOT Vmean:  53.000 cm/s LVOT VTI:    0.137 m  AORTA Ao Root diam: 4.00 cm Ao Asc diam:  4.00 cm  TRICUSPID VALVE TR Peak grad:   13.1 mmHg TR Vmax:        181.00 cm/s  SHUNTS Systemic VTI:  0.14 m Systemic Diam: 2.30 cm  Epifanio Lesches MD Electronically signed by Epifanio Lesches MD Signature Date/Time: 12/02/2022/11:10:22 AM    Final    MONITORS  LONG TERM MONITOR (3-14 DAYS) 01/19/2023  Narrative Patch Wear Time:  4 days and 0 hours (2024-07-25T14:41:36-0400 to 2024-07-29T15:16:26-0400)  Atrial Fibrillation occurred continuously (100% burden), ranging from 49-162 bpm (avg of 87 bpm). Isolated VEs were occasional (4.9%, 24381), VE Couplets were rare (<1.0%, 50), and no VE Triplets were present. Ventricular Bigeminy and Trigeminy were present.  Atrial fibrillation with PVCs or aberrantly conducted beats; rate controlled Olga Millers       ______________________________________________________________________________________________       Laboratory Data:  High Sensitivity Troponin:   Recent Labs  Lab 09/10/23 1928 09/11/23 0350  TROPONINIHS 27* 33*     Chemistry Recent Labs  Lab 09/10/23 1928 09/11/23 0350  NA 141 141  K 4.1 3.8  CL 106 106  CO2 26 24  GLUCOSE 105* 98  BUN 21 19  CREATININE 1.23 1.03  CALCIUM 9.0  8.7*  MG 2.2 2.0  GFRNONAA >60 >60  ANIONGAP 9 11  Recent Labs  Lab 09/10/23 1928 09/11/23 0350  PROT 6.6  6.7 5.7*  ALBUMIN 3.4*  3.5 3.2*  AST 20  19 17   ALT <5  <5 6  ALKPHOS 55  56 54  BILITOT 0.9  0.9 1.0   Lipids No results for input(s): "CHOL", "TRIG", "HDL", "LABVLDL", "LDLCALC", "CHOLHDL" in the last 168 hours.  Hematology Recent Labs  Lab 09/10/23 1928 09/11/23 0350  WBC 3.9* 3.8*  RBC 3.75* 3.59*  3.54*  HGB 12.4* 12.0*  HCT 38.7* 37.0*  MCV 103.2* 103.1*  MCH 33.1 33.4  MCHC 32.0 32.4  RDW 13.9 13.9  PLT 178 167   Thyroid  Recent Labs  Lab 09/10/23 1928  TSH 4.370    BNP Recent Labs  Lab 09/10/23 1928  BNP 938.1*    DDimer No results for input(s): "DDIMER" in the last 168 hours.   Radiology/Studies:  [facility] Chest Port 1 View Result Date: 09/10/2023 CLINICAL DATA:  Shortness of breath and leg swelling. EXAM: PORTABLE CHEST 1 VIEW COMPARISON:  12/25/2017 FINDINGS: Shallow inspiration. Cardiac enlargement. Pulmonary vascular congestion. Developing small effusions and basilar atelectasis or infiltration. This could indicate compressive atelectasis or edema. Pneumonia would be another possibility. Old right rib fractures. Mediastinal contours appear intact. IMPRESSION: Cardiac enlargement with pulmonary vascular congestion and infiltration in the lung bases. Small bilateral pleural effusions. Electronically Signed   By: Burman Nieves M.D.   On: 09/10/2023 20:29     Assessment and Plan:   Acute on Chronic HFmrEF  - Most recent echocardiogram from 11/2022 showed EF 45-50% with global hypokinesis, mildly reduced RV systolic function. Etiology of cardiomyopathy unknown. Thought to be possible tachy-mediated, but zio in 12/2022 showed good HR control - Now, patient presented with shortness of breath, orthopnea, lower extremity swelling. BNP elevated to 940. CXR with pulmonary vascular congestion, small bilateral plural effusions  - He was not given  lasix overnight due to low BP. On exam this AM, patient continues to have lower extremity edema, diminished breath sounds in lung bases. Reports orthopnea and is tachypneic with RR 24 BPM. Skin warm and well perfused  - BP improved to 105/75 this AM. Ordered a dose of IV lasix 40 mg daily. Follow BP and urine output and adjust dosing as needed - Strict I/Os, daily weights, daily BMPs to assess renal function  - Echocardiogram pending this admission   Permanent Atrial Fibrillation  - Arrived to the ED in afib with RVR with HR up to the 130s. Tried IV diltiazem but developed soft BP  - Suspect RVR is driven by CHF exacerbation  - Now on metoprolol tartrate 12.5 mg BID - first dose is scheduled for 10 AM today. Off AV nodal medications overnight, HR was in the 110s-120s - If BP cannot tolerate BB or if HR remains elevated, could consider digoxin  - Continue coumadin, dosed by pharmacy   Elevated Troponin  - hsTn 27>33 - Patient denied chest pain. Based on flat trop trend and his description of symptoms, suspect demand ischemia from hypervolemia/CHF.  - Echo pending this admission   Aortic Dilatation  - Echocardiogram from 11/2022 noted dilatation of the aortic root measuring 40 mm, dilatation of the ascending aorta measuring 40 mm  - Echo pending this admission   Otherwise per primary  - Parkinson's disease- on carbidopa-levodopa  - Factor V Leiden- on chronic coumadin therapy  - Obesity    Risk Assessment/Risk Scores:      New York Heart Association (NYHA) Functional Class  NYHA Class IV  CHA2DS2-VASc Score = 4   This indicates a 4.8% annual risk of stroke. The patient's score is based upon: CHF History: 1 HTN History: 1 Diabetes History: 0 Stroke History: 0 Vascular Disease History: 0 Age Score: 2 Gender Score: 0   For questions or updates, please contact [facility] Please consult [email or web address] for contact info under    Signed, Jonita Albee, PA-C   09/11/2023 7:55 AM

## 2023-09-11 NOTE — Progress Notes (Signed)
Heart rate too high for accurate echo at this time. 

## 2023-09-11 NOTE — Progress Notes (Signed)
 Echocardiogram 2D Echocardiogram has been performed.  Warren Lacy Nasreen Goedecke RDCS 09/11/2023, 2:49 PM  Dr. Flora Lipps notified

## 2023-09-11 NOTE — Evaluation (Signed)
 Physical Therapy Evaluation Patient Details Name: Stanley Wright MRN: [id number] DOB: [birth year] Today's Date: 09/11/2023  History of Present Illness  Pt is a 78 y.o. male admitted 3/27 for a fib with RVR. Pt presenting with SOB and LE edema. PMH: A-fib, DVT PE x2  factor V Leiden deficiency, Parkinson disease, AAA  Clinical Impression  Pt admitted with above diagnosis. PTA pt lived alone, mod I mobility/ADLs with rollator at household level. Pt currently with functional limitations due to the deficits listed below (see PT Problem List). On eval, pt required CGA transfers. He demo fair sitting balance, and poor standing balance. Unable to progress gait due to tachypnea and tachycardia. Resting HR in 120s, up to 130s with stance. RR in 30s, with increased WOB. SpO2 93% on RA. BP 94/71. Pt will benefit from acute skilled PT to increase their independence and safety with mobility to allow discharge.  PT to follow acutely. Pt declining follow up services.         If plan is discharge home, recommend the following: Assistance with cooking/housework;Assist for transportation;Help with stairs or ramp for entrance   Can travel by private vehicle        Equipment Recommendations None recommended by PT  Recommendations for Other Services       Functional Status Assessment Patient has had a recent decline in their functional status and demonstrates the ability to make significant improvements in function in a reasonable and predictable amount of time.     Precautions / Restrictions Precautions Precautions: Fall;Other (comment) Precaution/Restrictions Comments: watch vitals      Mobility  Bed Mobility               General bed mobility comments: Pt in recliner.    Transfers Overall transfer level: Needs assistance Equipment used: Rolling walker (2 wheels) Transfers: Sit to/from Stand Sit to Stand: Contact guard assist           General transfer comment: increased time to  power up    Ambulation/Gait               General Gait Details: deferred due to tachypnea and tachycardia  Stairs            Wheelchair Mobility     Tilt Bed    Modified Rankin (Stroke Patients Only)       Balance Overall balance assessment: Needs assistance Sitting-balance support: Feet supported, No upper extremity supported Sitting balance-Leahy Scale: Fair     Standing balance support: Bilateral upper extremity supported, During functional activity, Reliant on assistive device for balance Standing balance-Leahy Scale: Poor                               Pertinent Vitals/Pain Pain Assessment Pain Assessment: No/denies pain    Home Living Family/patient expects to be discharged to:: Private residence Living Arrangements: Alone Available Help at Discharge: Family;Friend(s);Available PRN/intermittently Type of Home: House Home Access: Ramped entrance       Home Layout: One level Home Equipment: Rollator (4 wheels);Shower seat - built in;BSC/3in1;Lift chair      Prior Function Prior Level of Function : Independent/Modified Independent             Mobility Comments: amb in home with rollator. Sleeps in lift chair. ADLs Comments: mod I basic ADLs. 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     Extremity/Trunk Assessment   Upper Extremity Assessment Upper Extremity Assessment: Defer to OT evaluation  Lower Extremity Assessment Lower Extremity Assessment: Generalized weakness    Cervical / Trunk Assessment Cervical / Trunk Assessment: Kyphotic  Communication   Communication Communication: Impaired Factors Affecting Communication: Hearing impaired;Reduced clarity of speech    Cognition Arousal: Alert Behavior During Therapy: WFL for tasks assessed/performed   PT - Cognitive impairments: No apparent impairments                         Following commands: Intact       Cueing       General Comments General comments  (skin integrity, edema, etc.): Resting HR in 120s. Into 130s with stance. RR in 30s, increased WOB. BP 94/71. SpO2 93% on RA.    Exercises     Assessment/Plan    PT Assessment Patient needs continued PT services  PT Problem List Decreased balance;Decreased mobility;Cardiopulmonary status limiting activity;Decreased activity tolerance       PT Treatment Interventions Gait training;Functional mobility training;Balance training;Patient/family education;Therapeutic activities;Therapeutic exercise    PT Goals (Current goals can be found in the Care Plan section)  Acute Rehab PT Goals Patient Stated Goal: home PT Goal Formulation: With patient Time For Goal Achievement: 09/25/23 Potential to Achieve Goals: Good    Frequency Min 2X/week     Co-evaluation               AM-PAC PT "6 Clicks" Mobility  Outcome Measure Help needed turning from your back to your side while in a flat bed without using bedrails?: A Little Help needed moving from lying on your back to sitting on the side of a flat bed without using bedrails?: A Little Help needed moving to and from a bed to a chair (including a wheelchair)?: A Little Help needed standing up from a chair using your arms (e.g., wheelchair or bedside chair)?: A Little Help needed to walk in hospital room?: A Little Help needed climbing 3-5 steps with a railing? : A Lot 6 Click Score: 17    End of Session   Activity Tolerance: Treatment limited secondary to medical complications (Comment) (tachypnea, tachycardia) Patient left: in chair;with call bell/phone within reach Nurse Communication: Mobility status PT Visit Diagnosis: Other abnormalities of gait and mobility (R26.89);Difficulty in walking, not elsewhere classified (R26.2)    Time: 1308-6578 PT Time Calculation (min) (ACUTE ONLY): 14 min   Charges:   PT Evaluation $PT Eval Moderate Complexity: 1 Mod   PT General Charges $$ ACUTE PT VISIT: 1 Visit         Ferd Glassing.,  PT  Office # [phone]   Ilda Foil 09/11/2023, 8:55 AM

## 2023-09-11 NOTE — ED Notes (Signed)
 Scrolling on cell phone, effecting SPO2 wave form, will monitor, 88-95% RA

## 2023-09-11 NOTE — ED Notes (Signed)
 Resting comfortably in recliner, NAD, calm, interactive, using urinal.

## 2023-09-12 DIAGNOSIS — Z515 Encounter for palliative care: Secondary | ICD-10-CM

## 2023-09-12 DIAGNOSIS — Z66 Do not resuscitate: Secondary | ICD-10-CM | POA: Insufficient documentation

## 2023-09-12 DIAGNOSIS — I5023 Acute on chronic systolic (congestive) heart failure: Secondary | ICD-10-CM | POA: Diagnosis not present

## 2023-09-12 DIAGNOSIS — I5043 Acute on chronic combined systolic (congestive) and diastolic (congestive) heart failure: Secondary | ICD-10-CM | POA: Diagnosis not present

## 2023-09-12 DIAGNOSIS — Z7189 Other specified counseling: Secondary | ICD-10-CM | POA: Diagnosis not present

## 2023-09-12 DIAGNOSIS — D6851 Activated protein C resistance: Secondary | ICD-10-CM | POA: Diagnosis not present

## 2023-09-12 DIAGNOSIS — I4821 Permanent atrial fibrillation: Secondary | ICD-10-CM

## 2023-09-12 DIAGNOSIS — I4891 Unspecified atrial fibrillation: Secondary | ICD-10-CM | POA: Diagnosis not present

## 2023-09-12 DIAGNOSIS — R600 Localized edema: Secondary | ICD-10-CM | POA: Diagnosis not present

## 2023-09-12 LAB — RENAL FUNCTION PANEL
Albumin: 3 g/dL — ABNORMAL LOW (ref 3.5–5.0)
Anion gap: 7 (ref 5–15)
BUN: 18 mg/dL (ref 8–23)
CO2: 25 mmol/L (ref 22–32)
Calcium: 8.6 mg/dL — ABNORMAL LOW (ref 8.9–10.3)
Chloride: 109 mmol/L (ref 98–111)
Creatinine, Ser: 1.11 mg/dL (ref 0.61–1.24)
GFR, Estimated: >60 mL/min (ref >60)
Glucose, Bld: 105 mg/dL — ABNORMAL HIGH (ref 70–99)
Phosphorus: 3.6 mg/dL (ref 2.5–4.6)
Potassium: 3.9 mmol/L (ref 3.5–5.1)
Sodium: 141 mmol/L (ref 135–145)

## 2023-09-12 LAB — CBC
HCT: 36.5 % — ABNORMAL LOW (ref 39.0–52.0)
Hemoglobin: 12 g/dL — ABNORMAL LOW (ref 13.0–17.0)
MCH: 33.2 pg (ref 26.0–34.0)
MCHC: 32.9 g/dL (ref 30.0–36.0)
MCV: 101.1 fL — ABNORMAL HIGH (ref 80.0–100.0)
Platelets: 171 10*3/uL (ref 150–400)
RBC: 3.61 MIL/uL — ABNORMAL LOW (ref 4.22–5.81)
RDW: 13.9 % (ref 11.5–15.5)
WBC: 6 10*3/uL (ref 4.0–10.5)
nRBC: 0 % (ref 0.0–0.2)

## 2023-09-12 LAB — MAGNESIUM: Magnesium: 2 mg/dL (ref 1.7–2.4)

## 2023-09-12 LAB — PROTIME-INR
INR: 2.7 — ABNORMAL HIGH (ref 0.8–1.2)
Prothrombin Time: 29 s — ABNORMAL HIGH (ref 11.4–15.2)

## 2023-09-12 MED ORDER — AMIODARONE LOAD VIA INFUSION
150.0000 mg | Freq: Once | INTRAVENOUS | Status: AC
  Administered 2023-09-12: 150 mg via INTRAVENOUS
  Filled 2023-09-12: qty 83.34

## 2023-09-12 MED ORDER — WARFARIN - PHARMACIST DOSING INPATIENT: Freq: Every day | Status: DC

## 2023-09-12 MED ORDER — AMIODARONE HCL IN DEXTROSE 360-4.14 MG/200ML-% IV SOLN
30.0000 mg/h | INTRAVENOUS | Status: DC
Start: 2023-09-12 — End: 2023-09-13
  Administered 2023-09-13 (×2): 30 mg/h via INTRAVENOUS
  Filled 2023-09-12 (×2): qty 200

## 2023-09-12 MED ORDER — AMIODARONE HCL IN DEXTROSE 360-4.14 MG/200ML-% IV SOLN
60.0000 mg/h | INTRAVENOUS | Status: AC
  Administered 2023-09-12 (×2): 60 mg/h via INTRAVENOUS
  Filled 2023-09-12 (×2): qty 200

## 2023-09-12 NOTE — Progress Notes (Signed)
 PROGRESS NOTE  Stanley Wright UJW:[id number] DOB: [birth year]   PCP: [facility]  Patient is from: Home.  Lives alone.  Uses rolling walker at baseline.  DOA: 09/10/2023 LOS: 1  Chief complaints Chief Complaint  Patient presents with   Shortness of Breath   Leg Swelling     Brief Narrative / Interim history: 78 year old M with PMH of HFmrEF, A-fib/PE/DVT/factor V Leyden deficiency on Coumadin, Parkinson disease and AAA presenting with progressive dyspnea, edema and orthopnea, and admitted with acute on chronic combined CHF and A-fib with RVR to 135.  He was also tachypneic to 20s and 30s.  BNP elevated to 938.  CXR consistent with CHF exacerbation.  Mildly evaded troponin without significant delta.  Became hypotensive after IV Cardizem hence discontinued.  TTE ordered.  Cardiology consulted and started digoxin and IV Lasix.   TTE with LVEF <20%, GH, indeterminate DD, RVSP of 41, severe LAE and moderate RAE.  Cardiology planning [facility] on 3/31 if INR acceptable.  Palliative consulted as well  Subjective: Seen and examined earlier this morning.  Patient is in RVR with HR to 120s and 130s.  Reports feeling better from breathing standpoint.  Has difficulty voiding.  He denies chest pain.  Had lengthy discussion about CODE STATUS with patient including pros and cons of CPR and intubation in light of his comorbidities with patient and patient's daughter at bedside.  Son-in-law also at bedside.  They all agree that DNR with limited intervention is appropriate at this time.  Objective: Vitals:   09/12/23 0823 09/12/23 0910 09/12/23 1033 09/12/23 1058  BP:  101/71 (!) 91/58   Pulse: (!) 137 (!) 116 (!) 137 (!) 122  Resp:  (!) 32 (!) 38   Temp:  98.5 F (36.9 C) 98.9 F (37.2 C)   TempSrc:  Oral Oral   SpO2: 94% 92% 90% 92%  Weight:        Examination:  GENERAL: No apparent distress.  Nontoxic. HEENT: MMM.  Vision and hearing grossly intact.  NECK: Flexed forward.   Difficult to assess JVD due to his position/posture. RESP:  No IWOB.  Fair aeration bilaterally. CVS: Irregular.  HR in 120s. Heart sounds normal.  ABD/GI/GU: BS+. Abd soft, NTND.  MSK/EXT:  Moves extremities. No apparent deformity.  Significant BLE edema with underlying venous insufficiency. SKIN: Dry skin in  lower extremities. NEURO: Awake, alert and oriented appropriately.  No apparent focal neuro deficit. PSYCH: Calm. Normal affect.   Consultants:  Cardiology  Procedures: None  Microbiology summarized: COVID-19, influenza and RSV PCR nonreactive  Assessment and plan: Acute on chronic HFmrEF:  Presents with SOB, edema and orthopnea.  BNP elevated to 938.  CXR consistent with acute CHF.  He was also in A-fib with RVR.  TTE <20%, GH, indeterminate DD, RVSP of 41, severe LAE and moderate RAE.  Diuresed about 1.5 L in the last 24 hours.  Creatinine stable.  Soft BP. -Cardiology on board-digoxin, IV Lasix and R/LHC on 3/31 if INR acceptable -Strict intake and output, daily weight, renal functions and electrolytes -Now DNR.  Palliative consulted for further GOC.  Permanent atrial fibrillation with RVR: Remains in RVR with HR ranging from 110s to 130s.  Soft BP.  TTE as above.  TSH normal. -On digoxin per cardiology -Warfarin on hold.  On IV heparin -Optimize electrolytes. -GOC as above   History of PE/DVT/factor V Leiden deficiency -Now on IV heparin.  Parkinson's disease: -Continue home Sinemet -Outpatient follow-up with neurology  Physical deconditioning:  Lives alone.  Uses rolling walker at baseline. -PT/OT  BPH without LUTS: Has difficulty initiating void.  530 cc urine on I&O cath this morning -Bladder scan every 6 hours -Strict intake and output  Psoriasis -Resume home Plaquenil  OSA not compliant with CPAP  Advance care planning: Extensive discussion about CODE STATUS including pros and cons of CPR in light of his comorbidities with patient, Sharene Butters (daughter) and  son-in-law at bedside.  At this time, they think DNR with limited intervention is appropriate.  CODE STATUS changed to DNR limited -Daughter would like to meet with palliative medicine.  Consult already requested yesterday.   Class I obesity Body mass index is 31.75 kg/m.           DVT prophylaxis:  Place TED hose Start: 09/11/23 0139  Code Status: Full code Family Communication: None at bedside Level of care: Progressive Status is: Inpatient Remains inpatient appropriate because: Acute CHF and A-fib with RVR   Final disposition: To be determined   55 minutes with more than 50% spent in reviewing records, counseling patient/family and coordinating care.   Sch Meds:  Scheduled Meds:  carbidopa-levodopa  1 tablet Oral QHS   carbidopa-levodopa  2 tablet Oral Q24H   And   carbidopa-levodopa  1 tablet Oral QID   digoxin  0.125 mg Oral Daily   furosemide  40 mg Intravenous BID   [START ON 09/16/2023] hydroxychloroquine  200 mg Oral Weekly   pramipexole  0.25 mg Oral QHS   Warfarin - Pharmacist Dosing Inpatient   Does not apply q1600   Continuous Infusions: PRN Meds:.ondansetron **OR** ondansetron (ZOFRAN) IV, mouth rinse  Antimicrobials: Anti-infectives (From admission, onward)    Start     Dose/Rate Route Frequency Ordered Stop   09/16/23 1030  hydroxychloroquine (PLAQUENIL) tablet 200 mg        200 mg Oral Weekly 09/11/23 1127          I have personally reviewed the following labs and images: CBC: Recent Labs  Lab 09/10/23 1928 09/11/23 0350 09/12/23 0347  WBC 3.9* 3.8* 6.0  HGB 12.4* 12.0* 12.0*  HCT 38.7* 37.0* 36.5*  MCV 103.2* 103.1* 101.1*  PLT 178 167 171   BMP &GFR Recent Labs  Lab 09/10/23 1928 09/11/23 0350 09/12/23 0347  NA 141 141 141  K 4.1 3.8 3.9  CL 106 106 109  CO2 26 24 25   GLUCOSE 105* 98 105*  BUN 21 19 18   CREATININE 1.23 1.03 1.11  CALCIUM 9.0 8.7* 8.6*  MG 2.2 2.0 2.0  PHOS 3.5 3.0 3.6   Estimated Creatinine  Clearance: 69 mL/min (by C-G formula based on SCr of 1.11 mg/dL). Liver & Pancreas: Recent Labs  Lab 09/10/23 1928 09/11/23 0350 09/12/23 0347  AST 20  19 17   --   ALT <5  <5 6  --   ALKPHOS 55  56 54  --   BILITOT 0.9  0.9 1.0  --   PROT 6.6  6.7 5.7*  --   ALBUMIN 3.4*  3.5 3.2* 3.0*   No results for input(s): "LIPASE", "AMYLASE" in the last 168 hours. No results for input(s): "AMMONIA" in the last 168 hours. Diabetic: No results for input(s): "HGBA1C" in the last 72 hours. No results for input(s): "GLUCAP" in the last 168 hours. Cardiac Enzymes: No results for input(s): "CKTOTAL", "CKMB", "CKMBINDEX", "TROPONINI" in the last 168 hours. No results for input(s): "PROBNP" in the last 8760 hours. Coagulation Profile: Recent Labs  Lab 09/10/23 1928  09/11/23 0350 09/12/23 0347  INR 2.7* 2.7* 2.7*   Thyroid Function Tests: Recent Labs    09/10/23 1928  TSH 4.370   Lipid Profile: No results for input(s): "CHOL", "HDL", "LDLCALC", "TRIG", "CHOLHDL", "LDLDIRECT" in the last 72 hours. Anemia Panel: Recent Labs    09/10/23 1928 09/11/23 0350  VITAMINB12  --  439  FOLATE  --  9.6  FERRITIN 223  --   TIBC 319  --   IRON 58  --   RETICCTPCT  --  1.1   Urine analysis:    Component Value Date/Time   COLORURINE YELLOW 09/11/2023 0423   APPEARANCEUR CLEAR 09/11/2023 0423   LABSPEC 1.023 09/11/2023 0423   PHURINE 5.0 09/11/2023 0423   GLUCOSEU NEGATIVE 09/11/2023 0423   HGBUR NEGATIVE 09/11/2023 0423   BILIRUBINUR NEGATIVE 09/11/2023 0423   BILIRUBINUR negative 09/23/2016 1432   BILIRUBINUR neg 06/28/2013 1518   KETONESUR 5 (A) 09/11/2023 0423   PROTEINUR NEGATIVE 09/11/2023 0423   UROBILINOGEN 0.2 09/23/2016 1432   UROBILINOGEN 0.2 12/03/2009 0043   NITRITE NEGATIVE 09/11/2023 0423   LEUKOCYTESUR NEGATIVE 09/11/2023 0423   Sepsis Labs: Invalid input(s): "PROCALCITONIN", "LACTICIDVEN"  Microbiology: Recent Results (from the past 240 hours)  Resp panel by  RT-PCR (RSV, Flu A&B, Covid) Anterior Nasal Swab     Status: None   Collection Time: 09/10/23  7:04 PM   Specimen: Anterior Nasal Swab  Result Value Ref Range Status   SARS Coronavirus 2 by RT PCR NEGATIVE NEGATIVE Final   Influenza A by PCR NEGATIVE NEGATIVE Final   Influenza B by PCR NEGATIVE NEGATIVE Final    Comment: (NOTE) The Xpert Xpress SARS-CoV-2/FLU/RSV plus assay is intended as an aid in the diagnosis of influenza from Nasopharyngeal swab specimens and should not be used as a sole basis for treatment. Nasal washings and aspirates are unacceptable for Xpert Xpress SARS-CoV-2/FLU/RSV testing.  Fact Sheet for Patients: BloggerCourse.com  Fact Sheet for Healthcare Providers: SeriousBroker.it  This test is not yet approved or cleared by the [place] FDA and has been authorized for detection and/or diagnosis of SARS-CoV-2 by FDA under an Emergency Use Authorization (EUA). This EUA will remain in effect (meaning this test can be used) for the duration of the COVID-19 declaration under Section 564(b)(1) of the Act, 21 U.S.C. section 360bbb-3(b)(1), unless the authorization is terminated or revoked.     Resp Syncytial Virus by PCR NEGATIVE NEGATIVE Final    Comment: (NOTE) Fact Sheet for Patients: BloggerCourse.com  Fact Sheet for Healthcare Providers: SeriousBroker.it  This test is not yet approved or cleared by the [place] FDA and has been authorized for detection and/or diagnosis of SARS-CoV-2 by FDA under an Emergency Use Authorization (EUA). This EUA will remain in effect (meaning this test can be used) for the duration of the COVID-19 declaration under Section 564(b)(1) of the Act, 21 U.S.C. section 360bbb-3(b)(1), unless the authorization is terminated or revoked.  Performed at [facility], [address] [place], [place] [address]      Radiology Studies: ECHOCARDIOGRAM COMPLETE Result Date: 09/11/2023    ECHOCARDIOGRAM REPORT   Patient Name:   JAK HAGGAR Date of Exam: 09/11/2023 Medical Rec #:  [id number]       Height:       72.0 in Accession #:    [id number]      Weight:       240.3 lb Date of Birth:  1945-08-18       BSA:  2.303 m Patient Age:    28 years        BP:           83/59 mmHg Patient Gender: M               HR:           75 bpm. Exam Location:  Inpatient Procedure: 2D Echo, Color Doppler, Cardiac Doppler and Intracardiac            Opacification Agent (Both Spectral and Color Flow Doppler were            utilized during procedure). Indications:    I48.91* Unspecified atrial fibrillation  History:        Patient has prior history of Echocardiogram examinations, most                 recent 12/02/2022. Arrythmias:Atrial Fibrillation,                 Signs/Symptoms:Parkinson's; Risk Factors:Sleep Apnea.  Sonographer:    Irving Burton Senior RDCS Referring Phys: Consepcion Hearing ANASTASSIA DOUTOVA IMPRESSIONS  1. Left ventricular ejection fraction, by estimation, is <20%. The left ventricle has severely decreased function. The left ventricle demonstrates global hypokinesis. The left ventricular internal cavity size was moderately to severely dilated. Left ventricular diastolic parameters are indeterminate. There is the interventricular septum is flattened in systole and diastole, consistent with right ventricular pressure and volume overload.  2. Right ventricular systolic function is mildly reduced. The right ventricular size is mildly enlarged. There is mildly elevated pulmonary artery systolic pressure. The estimated right ventricular systolic pressure is 41.0 mmHg.  3. Left atrial size was severely dilated.  4. Right atrial size was mild to moderately dilated.  5. The mitral valve is normal in structure. Mild to moderate mitral valve regurgitation. No evidence of mitral stenosis.  6. The aortic valve is tricuspid. Aortic valve  regurgitation is not visualized. Aortic valve sclerosis is present, with no evidence of aortic valve stenosis.  7. The inferior vena cava is dilated in size with <50% respiratory variability, suggesting right atrial pressure of 15 mmHg. Comparison(s): A prior study was performed on 12/02/2022. LVEF was 45-50% see prior report for more details. Conclusion(s)/Recommendation(s): No left ventricular mural or apical thrombus/thrombi. FINDINGS  Left Ventricle: Left ventricular ejection fraction, by estimation, is <20%. The left ventricle has severely decreased function. The left ventricle demonstrates global hypokinesis. Definity contrast agent was given IV to delineate the left ventricular endocardial borders. The left ventricular internal cavity size was moderately to severely dilated. There is no left ventricular hypertrophy. The interventricular septum is flattened in systole and diastole, consistent with right ventricular pressure and volume overload. Left ventricular diastolic function could not be evaluated due to atrial fibrillation. Left ventricular diastolic parameters are indeterminate. Right Ventricle: The right ventricular size is mildly enlarged. No increase in right ventricular wall thickness. Right ventricular systolic function is mildly reduced. There is mildly elevated pulmonary artery systolic pressure. The tricuspid regurgitant  velocity is 2.55 m/s, and with an assumed right atrial pressure of 15 mmHg, the estimated right ventricular systolic pressure is 41.0 mmHg. Left Atrium: Left atrial size was severely dilated. Right Atrium: Right atrial size was mild to moderately dilated. Pericardium: There is no evidence of pericardial effusion. Mitral Valve: The mitral valve is normal in structure. Mild to moderate mitral valve regurgitation, with centrally-directed jet. No evidence of mitral valve stenosis. Tricuspid Valve: The tricuspid valve is normal in structure. Tricuspid valve regurgitation is trivial.  Aortic Valve: The aortic valve is tricuspid. Aortic valve regurgitation is not visualized. Aortic valve sclerosis is present, with no evidence of aortic valve stenosis. Pulmonic Valve: The pulmonic valve was normal in structure. Pulmonic valve regurgitation is mild. Aorta: The aortic root and ascending aorta are structurally normal, with no evidence of dilitation. Venous: The inferior vena cava is dilated in size with less than 50% respiratory variability, suggesting right atrial pressure of 15 mmHg. IAS/Shunts: The atrial septum is grossly normal.  LEFT VENTRICLE PLAX 2D LVIDd:         6.10 cm LVIDs:         5.30 cm LV PW:         0.80 cm LV IVS:        0.80 cm LVOT diam:     2.20 cm LV SV:         46 LV SV Index:   20 LVOT Area:     3.80 cm  LV Volumes (MOD) LV vol d, MOD A2C: 245.0 ml LV vol d, MOD A4C: 229.0 ml LV vol s, MOD A2C: 242.5 ml LV vol s, MOD A4C: 213.0 ml LV SV MOD A2C:     2.5 ml LV SV MOD A4C:     229.0 ml LV SV MOD BP:      16.1 ml RIGHT VENTRICLE RV S prime:     8.38 cm/s TAPSE (M-mode): 1.4 cm LEFT ATRIUM              Index        RIGHT ATRIUM           Index LA diam:        4.90 cm  2.13 cm/m   RA Area:     27.20 cm LA Vol (A2C):   102.0 ml 44.28 ml/m  RA Volume:   90.20 ml  39.16 ml/m LA Vol (A4C):   130.0 ml 56.44 ml/m LA Biplane Vol: 117.0 ml 50.79 ml/m  AORTIC VALVE LVOT Vmax:   71.10 cm/s LVOT Vmean:  49.000 cm/s LVOT VTI:    0.120 m  AORTA Ao Root diam: 3.50 cm Ao Asc diam:  3.50 cm TRICUSPID VALVE TR Peak grad:   26.0 mmHg TR Vmax:        255.00 cm/s  SHUNTS Systemic VTI:  0.12 m Systemic Diam: 2.20 cm Sunit Tolia Electronically signed by Tessa Lerner Signature Date/Time: 09/11/2023/4:11:58 PM    Final       Donnajean Chesnut T. Tiny Chaudhary [facility]  If 7PM-7AM, please contact night-coverage [email or web address] 09/12/2023, 12:30 PM

## 2023-09-12 NOTE — Consult Note (Signed)
 Palliative Care Consult Note                                  Date: 09/12/2023   Patient Name: Stanley Wright  DOB: 1946/01/04  MRN: [id number]  Age / Sex: 78 y.o., male  PCP: [facility], [facility] Referring Physician: Almon Hercules, MD  Reason for Consultation: Establishing goals of care  HPI/Patient Profile: 78 y.o. 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   Admitted with acute on chronic combined congestive heart failure and atrial fibrillation with RVR.  Past Medical History:  Diagnosis Date   Arthritis    BENIGN PROSTATIC HYPERTROPHY, WITH OBSTRUCTION    Cancer (HCC)    skin, basal, squamous   COLONIC POLYPS    Diverticulitis    DVT (deep venous thrombosis) (HCC) 2000   Dysrhythmia    Hx Afib- 2017   Early cataract    Factor 5 Leiden mutation, heterozygous (HCC)    GERD (gastroesophageal reflux disease)    not on medication   Hearing aid worn    B/L   HIATAL HERNIA    ITP (idiopathic thrombocytopenic purpura) 2003   Long term current use of anticoagulant    Osteoarthritis    right foot   Parkinson's disease (HCC) 02/03/2018   PSORIASIS    Pulmonary embolus (HCC) 2000   Shortness of breath dyspnea    at times - Talking alot   Sleep apnea    Wears glasses     Subjective:   I have reviewed medical records including EPIC notes, labs and imaging, received an update from Dr. Alanda Slim, assessed the patient and then met with the patient, his daughter Stanley Wright, and son Stanley Wright to discuss diagnosis prognosis, GOC, EOL wishes, disposition and options.  I introduced Palliative Medicine as specialized medical care for people living with serious illness. It focuses on providing relief from symptoms and stress of a serious illness. The goal is to improve quality of life for both  the patient and the family.  Today's Discussion: Patient sitting up in bed awake but with eyes closed. He is tachypneic and his breathing looks uncomfortable. Patient states he is doing "okay." Daughter Stanley Wright and son Stanley Wright at bedside.  Family have a good understanding of the patient's chronic conditions and acute heart failure. Daughter Stanley Wright shared the patient has been living with Parkinson's for eight years. He also has several chronic conditions that he has been managing over the years. His family share that he has not always stayed on top of his healthcare and avoids hospitalizations. I reviewed my understanding of his conditions, including limitations of ongoing interventions and high risk for further decline despite aggressive treatment efforts.  The patient is a retired [employer]in [employer] man. He has two children and several grandchildren. He has always had a very good appetite. Prior to admission the patient lived independently but the entire family live on a "family compound" so they can be close if needed but can allow the patient more autonomy. Autonomy and independence are very important for the patient. The children have made improvements to his home to reduce fall risks and allow him to remain independent.   We discussed advanced directives. The patient has a declaration for natural death in [place]. We discussed code status, scope of treatment and goals  of care. The difference between an aggressive medical intervention path and a palliative comfort care path for this patient at this time was had. Patient and family confirm DNR/DNI status. The patient and family share that a comfort based path aligns with the patient's goals. I shared that the patient would qualify for hospice with his current health status. The goal of hospice is the preservation of dignity and quality at the end phases of life. Under hospice care, the focus changes from curative to symptom relief. I explained the three settings where  hospice services can be provided including the home, at a living facility (such as LTC SNF, Assisted Living, etc), and a hospice facility. I explained that acceptance to hospice in any specific location is the final decision of the hospice medical director and bed availability, if applicable. They are interested in home hospice. TOC order placed. If he were to decompensate tonight they would want to transition to comfort care instead of escalating care. Tonight they are going to consider whether they would want to keep him on current therapies until discharge with hospice to optimize him or transition in the hospital to see how he does.   Discussed the importance of continued conversation with family and the medical providers regarding overall plan of care and treatment options, ensuring decisions are within the context of the patient's values and GOCs.  Questions and concerns were addressed. Hard Choices booklet left for review. The family was encouraged to call with questions or concerns. PMT will continue to support holistically.  Review of Systems  Constitutional:  Positive for fatigue.  Respiratory:  Positive for shortness of breath.     Objective:   Primary Diagnoses: Present on Admission:  Atrial fibrillation with RVR (HCC)  Factor V Leiden (HCC)  Morbid obesity due to excess calories (HCC)  Parkinson's disease (HCC)  Acute on chronic systolic CHF (congestive heart failure) (HCC)  History of pulmonary embolism  Lower extremity edema  Persistent atrial fibrillation [facility])   Physical Exam Vitals reviewed.  Constitutional:      General: He is not in acute distress.    Appearance: He is ill-appearing.     Interventions: Nasal cannula in place.  HENT:     Head: Normocephalic and atraumatic.  Cardiovascular:     Rate and Rhythm: Tachycardia present.  Pulmonary:     Effort: Tachypnea present.  Skin:    General: Skin is warm and dry.  Neurological:     Mental Status: He is alert  and oriented to person, place, and time.  Psychiatric:        Mood and Affect: Mood normal.        Behavior: Behavior normal.     Vital Signs:  BP 104/75   Pulse (!) 124   Temp 98.6 F (37 C) (Oral)   Resp (!) 36   Wt 106.2 kg   SpO2 92%   BMI 31.75 kg/m    Advanced Care Planning:   Existing Vynca/ACP Documentation: Declaration for a natural death  Oncologist: PATIENT  Code Status/Advance Care Planning: DNR   Assessment & Plan:   SUMMARY OF RECOMMENDATIONS   DNR/DNI Continue current therapies for now-- will reevaluate in morning If patient decompensates overnight transition to comfort measures instead of escalating care Hospice evaluation- [facility] Continued PMT support- will follow up 09/13/23 at 9:00 am   Discussed with: Dr. Alanda Slim and bedside RN  Time Total: 120 minutes    Thank you for allowing [place] to participate in  the care of Stanley Wright PMT will continue to support holistically.   Signed by: Sarina Ser, NP Palliative Medicine Team  Team Phone # [phone] (Nights/Weekends)  09/12/2023, 4:47 PM

## 2023-09-12 NOTE — Progress Notes (Signed)
 PT Cancellation Note  Patient Details Name: Stanley Wright MRN: [id number] DOB: 1946-06-04   Cancelled Treatment:    Reason Eval/Treat Not Completed: (P) Patient not medically ready. Per OT discussion with RN, pt remains in a fib and starting on amio. Will hold off on PT at this time and follow-up as able.   [name], PT, DPT Acute Rehabilitation Services  Office: [phone]    [name] 09/12/2023, 1:26 PM

## 2023-09-12 NOTE — Progress Notes (Signed)
   Patient Name: Stanley Wright Date of Encounter: 09/12/2023 [facility] Cardiologist: Olga Millers, MD   Interval Summary  .    Appears tachypneic to me, but he reports that his breathing is "better than usual ".  Decent urine output overnight, net -2 L since admission.  Remains in atrial fibrillation with RVR with rates around 110 even when he is asleep, rates in the 120s-130s when awake.  Vital Signs .    Vitals:   09/12/23 0743 09/12/23 0800 09/12/23 0823 09/12/23 0910  BP: 97/74   101/71  Pulse: (!) 111 (!) 116 (!) 137 (!) 116  Resp: (!) 30   (!) 32  Temp: 99.1 F (37.3 C)   98.5 F (36.9 C)  TempSrc: Oral   Oral  SpO2: 91% 93% 94% 92%  Weight:        Intake/Output Summary (Last 24 hours) at 09/12/2023 0941 Last data filed at 09/12/2023 0902 Gross per 24 hour  Intake 120 ml  Output 2055 ml  Net -1935 ml      09/12/2023    4:32 AM 09/11/2023   11:52 AM 05/07/2023    2:46 PM  Last 3 Weights  Weight (lbs) 234 lb 2.1 oz 240 lb 4.8 oz 235 lb  Weight (kg) 106.2 kg 108.999 kg 106.595 kg      Telemetry/ECG    Atrial fibrillation with RVR 110 beats per minute average while asleep, 120s when awake - personally Reviewed   Echocardiogram 09/08/2023 shows severe global left ventricular hypokinesis with EF down to approximately 15% (previously 45-50% in June 2024).  Right ventricular function is also mildly reduced.  The left atrium is severely dilated.  There are no serious valvular abnormalities (there is mild-moderate MR which is likely secondary).  There are signs of elevated right atrial pressure and PA pressure is elevated at 41 mmHg  Physical Exam .   GEN: No acute distress.   Neck: 8-10 cm JVD Cardiac: Irregular rapid rhythm, S3 gallop present, soft apical holosystolic murmur, no diastolic murmurs, rubs, or gallops.  Respiratory: Clear to auscultation bilaterally. GI: Soft, nontender, non-distended  MS: No edema  Assessment & Plan .     78 year old  man with known heart failure with mildly reduced ejection fraction and permanent atrial fibrillation as well as Parkinson's disease and history of factor V Leiden with previous pulmonary embolism and frequent falls, presents with acute exacerbation of heart failure/pulmonary edema and found to have a new severe reduction in LVEF to less than 20%. Receiving diuretics, digoxin added for better rate control (but not loaded).  Beta-blockers being avoided due to severely depressed LVEF and acute heart failure decompensation. Lactic acid was borderline yesterday, but had improved when rechecked 3 hours later.   Renal function is holding out well so far and electrolytes are normal.   Continue IV diuretics. Plan right and left heart catheterization on Monday, as long as INR is in acceptable range.  For now on hold.  Start IV heparin when INR 2.0. Blood pressure precludes use of conventional AV nodal blocking agents.  Digoxin has not had time to "kick in" but would likely be insufficient for rate control, especially with activity.  I think we need to add amiodarone for rate control, at least in the short and mid term.  For questions or updates, please contact [facility] Please consult [email or web address] for contact info under        Signed, Thurmon Fair, MD

## 2023-09-12 NOTE — Progress Notes (Signed)
   09/12/23 0743  Assess: MEWS Score  Temp 99.1 F (37.3 C)  BP 97/74  MAP (mmHg) 81  Pulse Rate (!) 111  ECG Heart Rate (!) 108  Resp (!) 30  SpO2 91 %  O2 Device Nasal Cannula  O2 Flow Rate (L/min) 3 L/min  Assess: MEWS Score  MEWS Temp 0  MEWS Systolic 1  MEWS Pulse 1  MEWS RR 2  MEWS LOC 0  MEWS Score 4  MEWS Score Color Red  Assess: if the MEWS score is Yellow or Red  Were vital signs accurate and taken at a resting state? Yes  Does the patient meet 2 or more of the SIRS criteria? No  MEWS guidelines implemented  No, previously yellow, continue vital signs every 4 hours  Notify: Charge Nurse/RN  Name of Charge Nurse/RN Notified Musician  Assess: SIRS CRITERIA  SIRS Temperature  0  SIRS Respirations  1  SIRS Pulse 1  SIRS WBC 0  SIRS Score Sum  2

## 2023-09-12 NOTE — Progress Notes (Signed)
 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    Patient Measurements: Weight: 106.2 kg (234 lb 2.1 oz) Vital Signs: Temp: 98.9 F (37.2 C) (03/29 1033) Temp Source: Oral (03/29 1033) BP: 91/58 (03/29 1033) Pulse Rate: 122 (03/29 1058)  Labs: Recent Labs    09/10/23 1928 09/11/23 0350 09/12/23 0347  HGB 12.4* 12.0* 12.0*  HCT 38.7* 37.0* 36.5*  PLT 178 167 171  LABPROT 29.0* 28.7* 29.0*  INR 2.7* 2.7* 2.7*  CREATININE 1.23 1.03 1.11  TROPONINIHS 27* 33*  --     Estimated Creatinine Clearance: 69 mL/min (by C-G formula based on SCr of 1.11 mg/dL).  Medications:  -Warfarin 10mg  PO every Sunday, Wednesday and 5mg  PO all other days -Last Coumadin clinic note 3/17 with INR 4.5 (held doses on 3/17, 3/18)  Assessment: 45 yoM presented to ED with leg swelling and worsening dyspnea. Pharmacy consulted to continue dosing warfarin inpatient. Last dose taken on 3/27 (5mg  tablet)  PTA dosing: 5mg  daily except 10mg  Sun/Wed  INR today (3/29) 2.7, therapeutic  Hgb 12, Plt 171 stable No signs/symptoms of bleeding noted No new drug interactions Eating 100% of meals  Per Cardiology plan is to hold warfarin in anticipation of R/L heart cath Monday 3/31 and will initiate heparin drip if INR falls below 2  Goal of Therapy:  INR 2-3 Monitor platelets by anticoagulation protocol: Yes   Plan:  HOLD warfarin tonight Daily INR Monitor signs/symptoms of bleeding Monitor appetite and for new drug interactions  Stephenie Acres, PharmD PGY1 Pharmacy Resident 09/12/2023 11:18 AM

## 2023-09-12 NOTE — Plan of Care (Signed)
   Problem: Education: Goal: Knowledge of General Education information will improve Description Including pain rating scale, medication(s)/side effects and non-pharmacologic comfort measures Outcome: Progressing

## 2023-09-12 NOTE — Progress Notes (Signed)
 OT Cancellation Note  Patient Details Name: Stanley Wright MRN: [id number] DOB: [birth year]   Cancelled Treatment:    Reason Eval/Treat Not Completed: Medical issues which prohibited therapy Discussed with RN, remains in a fib and starting on amio. Will hold OT attempts and follow up tomorrow.  Lorre Munroe 09/12/2023, 11:05 AM

## 2023-09-12 NOTE — Progress Notes (Signed)
   09/12/23 1610  Provider Notification  Provider Name/Title Alanda Slim  Date Provider Notified 09/12/23  Time Provider Notified [phone]  Method of Notification Face-to-face  Notification Reason Other (Comment) (Red Mews)  Provider response At bedside  Date of Provider Response 09/12/23  Time of Provider Response [phone]

## 2023-09-13 DIAGNOSIS — I4891 Unspecified atrial fibrillation: Secondary | ICD-10-CM | POA: Diagnosis not present

## 2023-09-13 DIAGNOSIS — I5043 Acute on chronic combined systolic (congestive) and diastolic (congestive) heart failure: Secondary | ICD-10-CM | POA: Diagnosis not present

## 2023-09-13 DIAGNOSIS — Z515 Encounter for palliative care: Secondary | ICD-10-CM | POA: Diagnosis not present

## 2023-09-13 DIAGNOSIS — Z7189 Other specified counseling: Secondary | ICD-10-CM | POA: Diagnosis not present

## 2023-09-13 DIAGNOSIS — D6851 Activated protein C resistance: Secondary | ICD-10-CM | POA: Diagnosis not present

## 2023-09-13 LAB — RENAL FUNCTION PANEL
Albumin: 2.8 g/dL — ABNORMAL LOW (ref 3.5–5.0)
Anion gap: 7 (ref 5–15)
BUN: 15 mg/dL (ref 8–23)
CO2: 27 mmol/L (ref 22–32)
Calcium: 8.5 mg/dL — ABNORMAL LOW (ref 8.9–10.3)
Chloride: 108 mmol/L (ref 98–111)
Creatinine, Ser: 1 mg/dL (ref 0.61–1.24)
GFR, Estimated: >60 mL/min (ref >60)
Glucose, Bld: 97 mg/dL (ref 70–99)
Phosphorus: 3.6 mg/dL (ref 2.5–4.6)
Potassium: 3.7 mmol/L (ref 3.5–5.1)
Sodium: 142 mmol/L (ref 135–145)

## 2023-09-13 LAB — CBC
HCT: 36.1 % — ABNORMAL LOW (ref 39.0–52.0)
Hemoglobin: 11.7 g/dL — ABNORMAL LOW (ref 13.0–17.0)
MCH: 32.8 pg (ref 26.0–34.0)
MCHC: 32.4 g/dL (ref 30.0–36.0)
MCV: 101.1 fL — ABNORMAL HIGH (ref 80.0–100.0)
Platelets: 161 10*3/uL (ref 150–400)
RBC: 3.57 MIL/uL — ABNORMAL LOW (ref 4.22–5.81)
RDW: 13.7 % (ref 11.5–15.5)
WBC: 3.7 10*3/uL — ABNORMAL LOW (ref 4.0–10.5)
nRBC: 0 % (ref 0.0–0.2)

## 2023-09-13 LAB — PROTIME-INR
INR: 2.2 — ABNORMAL HIGH (ref 0.8–1.2)
Prothrombin Time: 24.2 s — ABNORMAL HIGH (ref 11.4–15.2)

## 2023-09-13 LAB — MAGNESIUM: Magnesium: 1.9 mg/dL (ref 1.7–2.4)

## 2023-09-13 MED ORDER — POLYVINYL ALCOHOL 1.4 % OP SOLN: 1.0000 [drp] | Freq: Four times a day (QID) | OPHTHALMIC | Status: DC | PRN

## 2023-09-13 MED ORDER — LORAZEPAM 1 MG PO TABS: 1.0000 mg | ORAL_TABLET | ORAL | Status: DC | PRN

## 2023-09-13 MED ORDER — GLYCOPYRROLATE 0.2 MG/ML IJ SOLN: 0.2000 mg | INTRAMUSCULAR | Status: DC | PRN

## 2023-09-13 MED ORDER — ONDANSETRON HCL 4 MG/2ML IJ SOLN
4.0000 mg | Freq: Four times a day (QID) | INTRAMUSCULAR | Status: DC | PRN
Start: 2023-09-13 — End: 2023-09-14

## 2023-09-13 MED ORDER — MORPHINE BOLUS VIA INFUSION
1.0000 mg | INTRAVENOUS | Status: DC | PRN
  Administered 2023-09-13 (×2): 1 mg via INTRAVENOUS
  Administered 2023-09-13 – 2023-09-14 (×3): 2 mg via INTRAVENOUS
  Administered 2023-09-14 (×2): 1 mg via INTRAVENOUS
  Administered 2023-09-14: 2 mg via INTRAVENOUS

## 2023-09-13 MED ORDER — LORAZEPAM 2 MG/ML IJ SOLN
1.0000 mg | INTRAMUSCULAR | Status: DC | PRN
  Administered 2023-09-13: 1 mg via INTRAVENOUS
  Filled 2023-09-13: qty 1

## 2023-09-13 MED ORDER — LORAZEPAM 2 MG/ML PO CONC: 1.0000 mg | ORAL | Status: DC | PRN

## 2023-09-13 MED ORDER — HALOPERIDOL LACTATE 5 MG/ML IJ SOLN: 2.0000 mg | INTRAMUSCULAR | Status: DC | PRN

## 2023-09-13 MED ORDER — HALOPERIDOL LACTATE 2 MG/ML PO CONC: 2.0000 mg | Freq: Four times a day (QID) | ORAL | Status: DC | PRN

## 2023-09-13 MED ORDER — MORPHINE SULFATE (PF) 4 MG/ML IV SOLN
INTRAVENOUS | Status: AC
  Filled 2023-09-13: qty 1

## 2023-09-13 MED ORDER — HALOPERIDOL 1 MG PO TABS: 2.0000 mg | ORAL_TABLET | Freq: Four times a day (QID) | ORAL | Status: DC | PRN

## 2023-09-13 MED ORDER — MORPHINE SULFATE (PF) 4 MG/ML IV SOLN: 4.0000 mg | INTRAVENOUS | Status: DC | PRN

## 2023-09-13 MED ORDER — MORPHINE SULFATE (PF) 4 MG/ML IV SOLN
4.0000 mg | INTRAVENOUS | Status: DC | PRN
  Administered 2023-09-13: 4 mg via INTRAVENOUS

## 2023-09-13 MED ORDER — BIOTENE DRY MOUTH MT LIQD
15.0000 mL | Freq: Two times a day (BID) | OROMUCOSAL | Status: DC
  Administered 2023-09-13: 15 mL via TOPICAL

## 2023-09-13 MED ORDER — MORPHINE 100MG IN NS 100ML (1MG/ML) PREMIX INFUSION
1.0000 mg/h | INTRAVENOUS | Status: DC
  Administered 2023-09-13: 1 mg/h via INTRAVENOUS
  Filled 2023-09-13: qty 100

## 2023-09-13 MED ORDER — ONDANSETRON 4 MG PO TBDP
4.0000 mg | ORAL_TABLET | Freq: Four times a day (QID) | ORAL | Status: DC | PRN
Start: 2023-09-13 — End: 2023-09-14

## 2023-09-13 MED ORDER — GLYCOPYRROLATE 1 MG PO TABS: 1.0000 mg | ORAL_TABLET | ORAL | Status: DC | PRN

## 2023-09-13 MED ORDER — DIPHENHYDRAMINE HCL 50 MG/ML IJ SOLN: 25.0000 mg | INTRAMUSCULAR | Status: DC | PRN

## 2023-09-13 NOTE — Plan of Care (Signed)
  Problem: Clinical Measurements: Goal: Will remain free from infection Outcome: Progressing Goal: Cardiovascular complication will be avoided Outcome: Progressing   Problem: Safety: Goal: Ability to remain free from injury will improve Outcome: Progressing   Problem: Education: Goal: Ability to verbalize understanding of medication therapies will improve Outcome: Progressing

## 2023-09-13 NOTE — Progress Notes (Signed)
   Patient Name: Stanley Wright Date of Encounter: 09/13/2023 [facility] Cardiologist: Olga Millers, MD   Interval Summary  .    He has no complaints, but remains tachypneic.   Has made some progress with diuresis, net 1.7 L in last 24 hours.   In atrial fibrillation with RVR around 100 bpm while asleep, 120s-130s when awake. Family has been meeting with our palliative care team and there are tentative plans to seek hospice care.  Vital Signs .    Vitals:   09/12/23 2215 09/12/23 2314 09/13/23 0512 09/13/23 0730  BP: 110/74 103/89 97/77 100/76  Pulse: (!) 114 (!) 34 (!) 121 (!) 103  Resp:  (!) 22 20 19   Temp:  98.2 F (36.8 C) 98.3 F (36.8 C) 98 F (36.7 C)  TempSrc:  Oral Oral Oral  SpO2: 98% 91% 97%   Weight:   105 kg   Height:        Intake/Output Summary (Last 24 hours) at 09/13/2023 0903 Last data filed at 09/13/2023 0515 Gross per 24 hour  Intake 436.28 ml  Output 1445 ml  Net -1008.72 ml      09/13/2023    5:12 AM 09/12/2023    4:32 AM 09/11/2023   11:52 AM  Last 3 Weights  Weight (lbs) 231 lb 8 oz 234 lb 2.1 oz 240 lb 4.8 oz  Weight (kg) 105.008 kg 106.2 kg 108.999 kg      Telemetry/ECG    Atrial fibrillation rapid ventricular response- Personally Reviewed  Physical Exam .   GEN: No acute distress.   Neck: Elevated JVD Cardiac: Irregular, S3 gallop present, soft apical holosystolic murmur, no diastolic murmurs, rubs, or gallops.  Respiratory: Clear to auscultation bilaterally. GI: Soft, nontender, non-distended  MS: No edema  Assessment & Plan .     78 year old man with known heart failure with mildly reduced ejection fraction and permanent atrial fibrillation as well as Parkinson's disease and history of factor V Leiden with previous pulmonary embolism and frequent falls, presents with acute exacerbation of heart failure/pulmonary edema and found to have a new severe reduction in LVEF to less than 20%.   We were planning R and L cath  tomorrow, but due to his multiple medical issues, the patient and his family are leaning towards full palliative care. Parkinson's disease has had a major negative impact on his quality of life. Palliative care is a reasonable choice, if the patient and family agree on this.  If active care is planned, recommend starting IV heparin and continuing IV amiodarone for another 24 hours, then switching to PO amiodarone, R and L heart cath this week..  For questions or updates, please contact [facility] Please consult [email or web address] for contact info under        Signed, Thurmon Fair, MD

## 2023-09-13 NOTE — Progress Notes (Signed)
 PT Cancellation Note  Patient Details Name: Stanley Wright MRN: [id number] DOB: 1946-06-11   Cancelled Treatment:    Reason Eval/Treat Not Completed: (P) Other (comment). Per conversation with OT, who spoke with RN, pt and family strongly considering transition to comfort care with pt and family request for no acute OT/PT services at this time. Due to this, PT is signing off at this time. Please reconsult as appropriate.   [name], PT, DPT Acute Rehabilitation Services  Office: [phone]   [name] 09/13/2023, 11:43 AM

## 2023-09-13 NOTE — Progress Notes (Addendum)
 [facility] PROGRESS NOTE    Progress Note  Stanley Wright  QMV:[id number] DOB: 02/03/1946 DOA: 09/10/2023 PCP: Medicine, [facility]     Brief Narrative:   Stanley Wright is an 78 y.o. 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 Cardiology was consulted he was started on IV Lasix and dig.  Cardiology is planning for Right and left heart cath on 08/27/2023. INR is acceptable.  Assessment/Plan:   Acute on chronic systolic and diastolic heart failure: 2D echo was done that showed an EF less than 20% diastolic dysfunction severe bilateral atrial dilation. Cardiology was consulted started on IV Lasix for right and left heart cath on 09/08/2023.  He is negative about 3 L. She is now DNR. Palliative care has been consulted to address goals of care. Patient has a poor prognosis  Chronic atrial fibrillation with atrial fibrillation with RVR: Continue digoxin and amiodarone per cardiology. Warfarin on hold, INR below 2 start IV heparin.  This morning INR is 2.2. Heart rate is still significantly elevated. Further management per cardiology.  Factor V Leiden (HCC)/PE DVT: INR 2.2, see above A-fib for further details  Parkinson disease: Continue Sinemet.  Deconditioning: He continues to work with PT OT.  BPH: Continue strict I's and O's and daily weights,  Psoriasis: Continue Plaquenil.  Advance care planning: Patient is now DNR/DNI palliative care has been consulted to address goals of care. Patient multiple comorbidities family is leaning towards comfort measures as also Parkinson's disease has had a negative impact on his quality of life. Which seems  reasonable. The family related that if he decompensates overnight To transition to comfort measures instead of escalating care.  Morbid obesity due to excess calories (HCC) Noted.    DVT prophylaxis: INR to continue Family Communication:Daughter Status is: Inpatient Remains inpatient appropriate because: Heart failure and A-fib with RVR    Code Status:     Code Status Orders  (From admission, onward)           Start     Ordered   09/12/23 1303  Do not attempt resuscitation (DNR)- Limited -Do Not Intubate (DNI)  (Code Status)  Continuous       Question Answer Comment  If pulseless and not breathing No CPR or chest compressions.   In Pre-Arrest Conditions (Patient Is Breathing and Has A Pulse) Do not intubate. Provide all appropriate non-invasive medical interventions. Avoid ICU transfer unless indicated or required.   Consent: Discussion documented in EHR or advanced directives reviewed      09/12/23 1302           Code Status History     Date Active Date Inactive Code Status Order ID Comments User Context   09/11/2023 0015 09/12/2023 1302 Full Code [id number]  Therisa Doyne, MD ED   12/23/2017 0115 12/26/2017 2046 Full Code [id number]  Hillary Bow, DO ED   02/25/2016 1123 02/26/2016 1444 Full Code [id number]  Emelia Loron, MD Inpatient      Advance Directive Documentation    Flowsheet Row Most Recent Value  Type of Advance Directive Healthcare Power of Attorney  Pre-existing out of facility DNR order (yellow form or pink MOST form) --  "MOST"  Form in Place? --         IV Access:   Peripheral IV   Procedures and diagnostic studies:   ECHOCARDIOGRAM COMPLETE Result Date: 09/11/2023    ECHOCARDIOGRAM REPORT   Patient Name:   Stanley Wright Date of Exam: 09/11/2023 Medical Rec #:  [id number]       Height:       72.0 in Accession #:    [id number]      Weight:       240.3 lb Date of Birth:  [birth year]       BSA:          2.303 m Patient Age:    78  years        BP:           83/59 mmHg Patient Gender: M               HR:           75 bpm. Exam Location:  Inpatient Procedure: 2D Echo, Color Doppler, Cardiac Doppler and Intracardiac            Opacification Agent (Both Spectral and Color Flow Doppler were            utilized during procedure). Indications:    I48.91* Unspecified atrial fibrillation  History:        Patient has prior history of Echocardiogram examinations, most                 recent 12/02/2022. Arrythmias:Atrial Fibrillation,                 Signs/Symptoms:Parkinson's; Risk Factors:Sleep Apnea.  Sonographer:    Stanley Wright Senior RDCS Referring Phys: Stanley Wright IMPRESSIONS  1. Left ventricular ejection fraction, by estimation, is <20%. The left ventricle has severely decreased function. The left ventricle demonstrates global hypokinesis. The left ventricular internal cavity size was moderately to severely dilated. Left ventricular diastolic parameters are indeterminate. There is the interventricular septum is flattened in systole and diastole, consistent with right ventricular pressure and volume overload.  2. Right ventricular systolic function is mildly reduced. The right ventricular size is mildly enlarged. There is mildly elevated pulmonary artery systolic pressure. The estimated right ventricular systolic pressure is 41.0 mmHg.  3. Left atrial size was severely dilated.  4. Right atrial size was mild to moderately dilated.  5. The mitral valve is normal in structure. Mild to moderate mitral valve regurgitation. No evidence of mitral stenosis.  6. The aortic valve is tricuspid. Aortic valve regurgitation is not visualized. Aortic valve sclerosis is present, with no evidence of aortic valve stenosis.  7. The inferior vena cava is dilated in size with <50% respiratory variability, suggesting right atrial pressure of 15 mmHg. Comparison(s): A prior study was performed on 12/02/2022. LVEF was 45-50% see prior report for more details.  Conclusion(s)/Recommendation(s): No left ventricular mural or apical thrombus/thrombi. FINDINGS  Left Ventricle: Left ventricular ejection fraction, by estimation, is <20%. The left ventricle has severely decreased function. The left ventricle demonstrates global hypokinesis. Definity contrast agent was given IV to delineate the left ventricular endocardial borders. The left ventricular internal cavity size was moderately to severely dilated. There is no left ventricular hypertrophy. The interventricular septum is flattened in systole and diastole, consistent with right ventricular pressure and volume overload. Left ventricular diastolic function could not be evaluated due to atrial fibrillation. Left ventricular diastolic parameters are indeterminate. Right Ventricle: The right ventricular size is mildly enlarged. No increase in  right ventricular wall thickness. Right ventricular systolic function is mildly reduced. There is mildly elevated pulmonary artery systolic pressure. The tricuspid regurgitant  velocity is 2.55 m/s, and with an assumed right atrial pressure of 15 mmHg, the estimated right ventricular systolic pressure is 41.0 mmHg. Left Atrium: Left atrial size was severely dilated. Right Atrium: Right atrial size was mild to moderately dilated. Pericardium: There is no evidence of pericardial effusion. Mitral Valve: The mitral valve is normal in structure. Mild to moderate mitral valve regurgitation, with centrally-directed jet. No evidence of mitral valve stenosis. Tricuspid Valve: The tricuspid valve is normal in structure. Tricuspid valve regurgitation is trivial. Aortic Valve: The aortic valve is tricuspid. Aortic valve regurgitation is not visualized. Aortic valve sclerosis is present, with no evidence of aortic valve stenosis. Pulmonic Valve: The pulmonic valve was normal in structure. Pulmonic valve regurgitation is mild. Aorta: The aortic root and ascending aorta are structurally normal, with no  evidence of dilitation. Venous: The inferior vena cava is dilated in size with less than 50% respiratory variability, suggesting right atrial pressure of 15 mmHg. IAS/Shunts: The atrial septum is grossly normal.  LEFT VENTRICLE PLAX 2D LVIDd:         6.10 cm LVIDs:         5.30 cm LV PW:         0.80 cm LV IVS:        0.80 cm LVOT diam:     2.20 cm LV SV:         46 LV SV Index:   20 LVOT Area:     3.80 cm  LV Volumes (MOD) LV vol d, MOD A2C: 245.0 ml LV vol d, MOD A4C: 229.0 ml LV vol s, MOD A2C: 242.5 ml LV vol s, MOD A4C: 213.0 ml LV SV MOD A2C:     2.5 ml LV SV MOD A4C:     229.0 ml LV SV MOD BP:      16.1 ml RIGHT VENTRICLE RV S prime:     8.38 cm/s TAPSE (M-mode): 1.4 cm LEFT ATRIUM              Index        RIGHT ATRIUM           Index LA diam:        4.90 cm  2.13 cm/m   RA Area:     27.20 cm LA Vol (A2C):   102.0 ml 44.28 ml/m  RA Volume:   90.20 ml  39.16 ml/m LA Vol (A4C):   130.0 ml 56.44 ml/m LA Biplane Vol: 117.0 ml 50.79 ml/m  AORTIC VALVE LVOT Vmax:   71.10 cm/s LVOT Vmean:  49.000 cm/s LVOT VTI:    0.120 m  AORTA Ao Root diam: 3.50 cm Ao Asc diam:  3.50 cm TRICUSPID VALVE TR Peak grad:   26.0 mmHg TR Vmax:        255.00 cm/s  SHUNTS Systemic VTI:  0.12 m Systemic Diam: 2.20 cm Sunit Tolia Electronically signed by Tessa Lerner Signature Date/Time: 09/11/2023/4:11:58 PM    Final      Medical Consultants:   None.   Subjective:    Stanley Wright really shortness of breath is not improved.  Objective:    Vitals:   09/12/23 2215 09/12/23 2314 09/13/23 0512 09/13/23 0730  BP: 110/74 103/89 97/77 100/76  Pulse: (!) 114 (!) 34 (!) 121 (!) 103  Resp:  (!) 22 20 19   Temp:  98.2 F (  36.8 C) 98.3 F (36.8 C) 98 F (36.7 C)  TempSrc:  Oral Oral Oral  SpO2: 98% 91% 97%   Weight:   105 kg   Height:       SpO2: 97 % O2 Flow Rate (L/min): 3 L/min   Intake/Output Summary (Last 24 hours) at 09/13/2023 1026 Last data filed at 09/13/2023 0515 Gross per 24 hour  Intake 436.28 ml   Output 1445 ml  Net -1008.72 ml   Filed Weights   09/11/23 1152 09/12/23 0432 09/13/23 0512  Weight: 109 kg 106.2 kg 105 kg    Exam: General exam: In no acute distress. Respiratory system: Good air movement and clear to auscultation. Cardiovascular system: S1 & S2 heard, RRR. No positive JVD Gastrointestinal system: Abdomen is nondistended, soft and nontender.  Extremities: 3+ edema Skin: No rashes, lesions or ulcers Data Reviewed:    Labs: Basic Metabolic Panel: Recent Labs  Lab 09/10/23 1928 09/11/23 0350 09/12/23 0347 09/13/23 0430  NA 141 141 141 142  K 4.1 3.8 3.9 3.7  CL 106 106 109 108  CO2 26 24 25 27   GLUCOSE 105* 98 105* 97  BUN 21 19 18 15   CREATININE 1.23 1.03 1.11 1.00  CALCIUM 9.0 8.7* 8.6* 8.5*  MG 2.2 2.0 2.0 1.9  PHOS 3.5 3.0 3.6 3.6   GFR Estimated Creatinine Clearance: 76.3 mL/min (by C-G formula based on SCr of 1 mg/dL). Liver Function Tests: Recent Labs  Lab 09/10/23 1928 09/11/23 0350 09/12/23 0347 09/13/23 0430  AST 20  19 17   --   --   ALT <5  <5 6  --   --   ALKPHOS 55  56 54  --   --   BILITOT 0.9  0.9 1.0  --   --   PROT 6.6  6.7 5.7*  --   --   ALBUMIN 3.4*  3.5 3.2* 3.0* 2.8*   No results for input(s): "LIPASE", "AMYLASE" in the last 168 hours. No results for input(s): "AMMONIA" in the last 168 hours. Coagulation profile Recent Labs  Lab 09/10/23 1928 09/11/23 0350 09/12/23 0347 09/13/23 0430  INR 2.7* 2.7* 2.7* 2.2*   COVID-19 Labs  Recent Labs    09/10/23 1928  FERRITIN 223    Lab Results  Component Value Date   SARSCOV2NAA NEGATIVE 09/10/2023   SARSCOV2NAA NEGATIVE 08/22/2019    CBC: Recent Labs  Lab 09/10/23 1928 09/11/23 0350 09/12/23 0347 09/13/23 0430  WBC 3.9* 3.8* 6.0 3.7*  HGB 12.4* 12.0* 12.0* 11.7*  HCT 38.7* 37.0* 36.5* 36.1*  MCV 103.2* 103.1* 101.1* 101.1*  PLT 178 167 171 161   Cardiac Enzymes: No results for input(s): "CKTOTAL", "CKMB", "CKMBINDEX", "TROPONINI" in the last  168 hours. BNP (last 3 results) No results for input(s): "PROBNP" in the last 8760 hours. CBG: No results for input(s): "GLUCAP" in the last 168 hours. D-Dimer: No results for input(s): "DDIMER" in the last 72 hours. Hgb A1c: No results for input(s): "HGBA1C" in the last 72 hours. Lipid Profile: No results for input(s): "CHOL", "HDL", "LDLCALC", "TRIG", "CHOLHDL", "LDLDIRECT" in the last 72 hours. Thyroid function studies: Recent Labs    09/10/23 1928  TSH 4.370   Anemia work up: Recent Labs    09/10/23 1928 09/11/23 0350  VITAMINB12  --  439  FOLATE  --  9.6  FERRITIN 223  --   TIBC 319  --   IRON 58  --   RETICCTPCT  --  1.1   Sepsis Labs:  Recent Labs  Lab 09/10/23 1928 09/11/23 0350 09/11/23 1616 09/11/23 1911 09/12/23 0347 09/13/23 0430  WBC 3.9* 3.8*  --   --  6.0 3.7*  LATICACIDVEN  --   --  2.0* 1.3  --   --    Microbiology Recent Results (from the past 240 hours)  Resp panel by RT-PCR (RSV, Flu A&B, Covid) Anterior Nasal Swab     Status: None   Collection Time: 09/10/23  7:04 PM   Specimen: Anterior Nasal Swab  Result Value Ref Range Status   SARS Coronavirus 2 by RT PCR NEGATIVE NEGATIVE Final   Influenza A by PCR NEGATIVE NEGATIVE Final   Influenza B by PCR NEGATIVE NEGATIVE Final    Comment: (NOTE) The Xpert Xpress SARS-CoV-2/FLU/RSV plus assay is intended as an aid in the diagnosis of influenza from Nasopharyngeal swab specimens and should not be used as a sole basis for treatment. Nasal washings and aspirates are unacceptable for Xpert Xpress SARS-CoV-2/FLU/RSV testing.  Fact Sheet for Patients: BloggerCourse.com  Fact Sheet for Healthcare Providers: SeriousBroker.it  This test is not yet approved or cleared by the [place] FDA and has been authorized for detection and/or diagnosis of SARS-CoV-2 by FDA under an Emergency Use Authorization (EUA). This EUA will remain in effect (meaning  this test can be used) for the duration of the COVID-19 declaration under Section 564(b)(1) of the Act, 21 U.S.C. section 360bbb-3(b)(1), unless the authorization is terminated or revoked.     Resp Syncytial Virus by PCR NEGATIVE NEGATIVE Final    Comment: (NOTE) Fact Sheet for Patients: BloggerCourse.com  Fact Sheet for Healthcare Providers: SeriousBroker.it  This test is not yet approved or cleared by the [place] FDA and has been authorized for detection and/or diagnosis of SARS-CoV-2 by FDA under an Emergency Use Authorization (EUA). This EUA will remain in effect (meaning this test can be used) for the duration of the COVID-19 declaration under Section 564(b)(1) of the Act, 21 U.S.C. section 360bbb-3(b)(1), unless the authorization is terminated or revoked.  Performed at [facility], [address], [place], [place] [address]      Medications:    carbidopa-levodopa  1 tablet Oral QHS   carbidopa-levodopa  2 tablet Oral Q24H   And   carbidopa-levodopa  1 tablet Oral QID   digoxin  0.125 mg Oral Daily   furosemide  40 mg Intravenous BID   [START ON 09/16/2023] hydroxychloroquine  200 mg Oral Weekly   pramipexole  0.25 mg Oral QHS   Warfarin - Pharmacist Dosing Inpatient   Does not apply q1600   Continuous Infusions:  amiodarone 30 mg/hr (09/13/23 0133)      LOS: 2 days   Marinda Elk  [facility]  09/13/2023, 10:26 AM

## 2023-09-13 NOTE — Progress Notes (Addendum)
 Daily Progress Note   Patient Name: Stanley Wright       Date: 09/13/2023 DOB: [birth year]  Age: 78 y.o. MRN#: [id number] Attending Physician: Stanley Elk, MD Primary Care Physician: Medicine, [facility] Internal Admit Date: 09/10/2023  Reason for Consultation/Follow-up: Establishing goals of care  Length of Stay: 2  Physical Exam Vitals reviewed.  Constitutional:      General: He is not in acute distress.    Appearance: He is ill-appearing.  Cardiovascular:     Rate and Rhythm: Tachycardia present.  Pulmonary:     Effort: Tachypnea present.  Skin:    General: Skin is warm and dry.  Neurological:     Mental Status: He is alert and oriented to person, place, and time.  Psychiatric:        Mood and Affect: Mood normal.        Behavior: Behavior normal.             Vital Signs: BP 100/76 (BP Location: Right Arm)   Pulse (!) 103   Temp 98 F (36.7 C) (Oral)   Resp 19   Ht 6' (1.829 m)   Wt 105 kg   SpO2 97%   BMI 31.40 kg/m  SpO2: SpO2: 97 % O2 Device: O2 Device: Nasal Cannula O2 Flow Rate: O2 Flow Rate (L/min): 3 L/min   Patient Active Problem List   Diagnosis Date Noted   DNR (do not resuscitate) 09/12/2023   Advance care planning 09/12/2023   Atrial fibrillation with RVR (HCC) 09/10/2023   Acute on chronic systolic CHF (congestive heart failure) (HCC) 09/10/2023   Primary osteoarthritis, right ankle and foot    Lower extremity edema 08/10/2019   Acute DVT (deep venous thrombosis) (HCC) 07/19/2019   History of pulmonary embolism 07/19/2019   Parkinson's disease (HCC) 02/03/2018   Community acquired pneumonia of left lower lobe of lung 12/23/2017   Sepsis (HCC) 12/23/2017   Morbid obesity due to excess calories (HCC) 08/23/2016   OSA on CPAP 08/23/2016    Dyspnea on exertion 08/22/2016   Umbilical hernia 02/25/2016   Persistent atrial fibrillation (HCC)    Encounter for therapeutic drug monitoring 08/12/2013   Long term current use of anticoagulant 07/30/2010   COLONIC POLYPS 06/03/2010   Factor V Leiden (HCC) 06/03/2010   GERD 06/03/2010   Diaphragmatic  hernia 06/03/2010   BENIGN PROSTATIC HYPERTROPHY, WITH OBSTRUCTION 06/03/2010   PSORIASIS 06/03/2010    Palliative Care Assessment & Plan   Patient Profile: 78 y.o. male  with past medical history of HFmrEF, A-fib/PE/DVT/factor V Leyden deficiency on Coumadin, Parkinson disease and AAA admitted on 09/10/2023 with progressive dyspnea, edema and orthopnea.    Admitted with acute on chronic combined congestive heart failure and atrial fibrillation with RVR.  Today's Discussion: Patient sitting up in bed awake but with eyes closed. He is tachypneic. He reports he had a pretty good night. Daughter, son-in-law, and son at bedside.   Daughter Stanley Wright shares they researched and discussed options overnight. Stanley Wright is concerned they may not be able to provide the level of care they would want to provide at home. They would like to have the patient evaluated for inpatient hospice instead of home hospice. We discussed that acceptance to hospice in any specific location is the final decision of the hospice medical director and bed availability, if applicable. ACC liaison to evaluate patient today.  We will reconnect after the patient has been evaluated. If he is not accepted to inpatient hospice we will further discuss options.  1430: Received notification from nurse that family would like to transition to comfort care now as his WOB has worsened. Attending physician placed order for morphine until PMT could add symptom management medications. Spoke with Stanley Wright hospice liaison patient's family would like to proceed with hospice care-- they are open to [facility] location.  1500: Assessed patient he remains  tachypneic. Transitioned to comfort care. Discussed continuous morphine infusion with family and they would like to proceed with this. Comfort measures ordered.  Discussed the importance of continued conversation with family and the medical providers regarding overall plan of care and treatment options, ensuring decisions are within the context of the patient's values and GOCs.   Encouraged family to call PMT with questions or concerns. PMT will continue to support.  Recommendations/Plan: Changed to full comfort care Symptom management per Stanley Wright Inpatient hospice evaluation- [facility] Continued PMT support  Symptom Management: Morphine continuous infusion and bolus doses PRN for pain/air hunger/comfort Robinul PRN for excessive secretions Ativan PRN for agitation/anxiety Zofran PRN for nausea Liquifilm tears PRN for dry eyes Haldol PRN for agitation/anxiety May have comfort feeding Comfort cart for family Unrestricted visitations in the setting of EOL (per policy) Oxygen PRN 2L or less for comfort. No escalation.   Code Status:    Code Status Orders  (From admission, onward)           Start     Ordered   09/12/23 1303  Do not attempt resuscitation (DNR)- Limited -Do Not Intubate (DNI)  (Code Status)  Continuous       Question Answer Comment  If pulseless and not breathing No CPR or chest compressions.   In Pre-Arrest Conditions (Patient Is Breathing and Has A Pulse) Do not intubate. Provide all appropriate non-invasive medical interventions. Avoid ICU transfer unless indicated or required.   Consent: Discussion documented in EHR or advanced directives reviewed      09/12/23 1302          Extensive chart review has been completed prior to seeing the patient including labs, vital signs, imaging, progress/consult notes, orders, medications, and available advance directive documents.  Care plan was discussed with SW, CM, ACC liaison, and Dr. David Wright  Time spent:  110 minutes  Thank you for allowing the Palliative Medicine Team to assist in the care of this  patient.  Stanley Burger, NP  Please contact Palliative Medicine Team phone at [phone] for questions and concerns.

## 2023-09-13 NOTE — Progress Notes (Signed)
 OT Cancellation and Discharge Note  Patient Details Name: Stanley Wright MRN: [id number] DOB: 1945/11/01   Cancelled Treatment:    Reason Eval/Treat Not Completed: Other (comment) (Per conversation with RN, pt and family strongly considering transition to comfort care with pt and family request for no acute OT/PT services at this time. Due to this, OT is signing off at this time. Please reconsult as appropriate.)  Jahanna Raether "Stanley Wright., OTR/L, MA Acute Rehab [phone]  Lendon Colonel 09/13/2023, 10:01 AM

## 2023-09-13 NOTE — Progress Notes (Signed)
 Stanley Wright [phone] [facility] Liaison Note   Referral received from hospital care team for family interest in Hospice Home.     Met with patient and family to explain services and hospice philosophy and all questions answered.     Hospice Home is able to accept patient this afternoon once consents are complete.     Asked by hospital team to please hold discharge until tomorrow morning. Spoke with daughter and she is fine with this. Requests that discharge is done as early as possible tomorrow. [facility] Liaison team to continue to follow.     Please leave IV intact and send completed DNR with patient at time of discharge.     Updated attending and [facility] manager via [employer].   Thank you for the opportunity to participate in this patient's care     Roe Rutherford, BSN, RN Hospice Nurse Liaison [phone]

## 2023-09-13 NOTE — Plan of Care (Signed)
   Problem: Education: Goal: Knowledge of General Education information will improve Description Including pain rating scale, medication(s)/side effects and non-pharmacologic comfort measures Outcome: Progressing   Problem: Health Behavior/Discharge Planning: Goal: Ability to manage health-related needs will improve Outcome: Progressing

## 2023-09-13 NOTE — Progress Notes (Signed)
 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    Patient Measurements: Height: 6' (182.9 cm) Weight: 105 kg (231 lb 8 oz) IBW/kg (Calculated) : 77.6 Vital Signs: Temp: 98 F (36.7 C) (03/30 0730) Temp Source: Oral (03/30 0730) BP: 100/76 (03/30 0730) Pulse Rate: 103 (03/30 0730)  Labs: Recent Labs    09/10/23 1928 09/11/23 0350 09/12/23 0347 09/13/23 0430  HGB 12.4* 12.0* 12.0* 11.7*  HCT 38.7* 37.0* 36.5* 36.1*  PLT 178 167 171 161  LABPROT 29.0* 28.7* 29.0* 24.2*  INR 2.7* 2.7* 2.7* 2.2*  CREATININE 1.23 1.03 1.11 1.00  TROPONINIHS 27* 33*  --   --     Estimated Creatinine Clearance: 76.3 mL/min (by C-G formula based on SCr of 1 mg/dL).  Medications:  -Warfarin 10mg  PO every Sunday, Wednesday and 5mg  PO all other days -Last Coumadin clinic note 3/17 with INR 4.5 (held doses on 3/17, 3/18)  Assessment: 82 yoM presented to ED with leg swelling and worsening dyspnea. Pharmacy consulted to continue dosing warfarin inpatient. Last dose taken on 3/27 (5mg  tablet)  PTA dosing: 5mg  daily except 10mg  Sun/Wed  INR today (3/30) 2.2, therapeutic  Hgb 11.7, Plt 161 stable No signs/symptoms of bleeding noted No new drug interactions Eating 100% of meals  Per Cardiology plan is to hold warfarin in anticipation of R/L heart cath Monday 3/31 and will initiate heparin drip if INR falls below 2  New discussion surrounding palliative care. Will continue current plan and will follow for final decisions.  Goal of Therapy:  INR 2-3 Monitor platelets by anticoagulation protocol: Yes   Plan:  HOLD warfarin tonight Daily INR Monitor signs/symptoms of bleeding Monitor appetite and for new drug interactions Follow up goals of care  decisions  Stephenie Acres, PharmD PGY1 Pharmacy Resident 09/13/2023 10:40 AM

## 2023-09-14 ENCOUNTER — Ambulatory Visit (HOSPITAL_COMMUNITY): Admit: 2023-09-14 | Admitting: Cardiology

## 2023-09-14 DIAGNOSIS — I4891 Unspecified atrial fibrillation: Secondary | ICD-10-CM | POA: Diagnosis not present

## 2023-09-14 SURGERY — RIGHT/LEFT HEART CATH AND CORONARY ANGIOGRAPHY
Anesthesia: LOCAL

## 2023-09-14 MED ORDER — SODIUM CHLORIDE 0.9 % IV SOLN: INTRAVENOUS | Status: DC

## 2023-09-14 MED ORDER — ASPIRIN 81 MG PO CHEW: 81.0000 mg | CHEWABLE_TABLET | ORAL | Status: DC

## 2023-09-14 MED ORDER — MORPHINE 100MG IN NS 100ML (1MG/ML) PREMIX INFUSION
1.0000 mg/h | INTRAVENOUS | Status: DC
Start: 2023-09-14 — End: 2023-09-22

## 2023-09-14 MED ORDER — LORAZEPAM 2 MG/ML PO CONC: 1.0000 mg | ORAL | Status: DC | PRN

## 2023-09-15 ENCOUNTER — Ambulatory Visit

## 2023-09-15 NOTE — Progress Notes (Signed)
 Heart Failure Navigator Progress Note  Assessed for Heart & Vascular TOC clinic readiness.  Patient does not meet criteria due to discharge home with Hospice. No HF TOC. .   Navigator will sign off at this time.   Rhae Hammock, BSN, Scientist, clinical (histocompatibility and immunogenetics) Only

## 2023-09-15 NOTE — Plan of Care (Signed)
  Problem: Clinical Measurements: Goal: Quality of life will improve Outcome: Progressing   Problem: Respiratory: Goal: Verbalizations of increased ease of respirations will increase Outcome: Progressing   Problem: Pain Management: Goal: Satisfaction with pain management regimen will improve Outcome: Progressing   Problem: Clinical Measurements: Goal: Quality of life will improve Outcome: Progressing   Problem: Respiratory: Goal: Verbalizations of increased ease of respirations will increase Outcome: Progressing   Problem: Pain Management: Goal: Satisfaction with pain management regimen will improve Outcome: Progressing

## 2023-09-15 NOTE — Progress Notes (Signed)
 Brief cardiology progress note:  Reviewed notes in chart, planned for discharge to home hospice today. Given this, no plans for further cardiology testing/management. Thank you for allowing [place] to participate in the care of this patient.  Jodelle Red, MD, PhD, [facility] [place]  [facility]  [place]  Heart & Vascular at [facility] at [facility] [name] [place], [place] [address] [phone]

## 2023-09-15 NOTE — Death Summary Note (Signed)
 Death Summary  Stanley Wright UJW:[id number] DOB: June 12, 1946 DOA: 09/20/23  PCP: [facility]  Admit date: Sep 20, 2023 Date of Death: 2023-09-24 Time of Death: 10:13 am Notification: [facility] notified of death of 09/24/2023   History of present illness:  Stanley Wright is a 78 y.o. male with a history of   78 y.o. 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 Cardiology was consulted he was started on IV Lasix and dig.  Cardiology is planning for Right and left heart cath on 2023-09-24.  Final Diagnoses:  Acute on chronic systolic and diastolic heart failure: With 2D echo showing 20%. Cardiology was consulted he was started on IV Lasix he diuresed about 3 L. Palliative care was consulted the patient was made a DNR. After explaining to the family the the poor outcome.  The family related they would like time to think about it. But the patient became severely short of breath after this the family decided to move towards comfort measure. Was started on IV morphine. Patient passed away in the hospital at 10:30 AM.  Factor V Leiden (HCC)/PE DVT: Parkinson disease: Deconditioning: BPH: Psoriasis:   The results of significant diagnostics from this hospitalization (including imaging, microbiology, ancillary and laboratory) are listed below for reference.    Significant Diagnostic Studies: ECHOCARDIOGRAM COMPLETE Result Date: 09/11/2023    ECHOCARDIOGRAM REPORT   Patient Name:   Stanley Wright Date of Exam: 09/11/2023 Medical Rec #:  [id number]       Height:       72.0 in Accession #:    [id number]      Weight:       240.3 lb Date of Birth:  1945/08/23        BSA:          2.303 m Patient Age:    78 years        BP:           83/59 mmHg Patient Gender: M               HR:           75 bpm. Exam Location:  Inpatient Procedure: 2D Echo, Color Doppler, Cardiac Doppler and Intracardiac            Opacification Agent (Both Spectral and Color Flow Doppler were            utilized during procedure). Indications:    I48.91* Unspecified atrial fibrillation  History:        Patient has prior history of Echocardiogram examinations, most                 recent 12/02/2022. Arrythmias:Atrial Fibrillation,                 Signs/Symptoms:Parkinson's; Risk Factors:Sleep Apnea.  Sonographer:    Irving Burton Senior RDCS Referring Phys: Consepcion Hearing ANASTASSIA DOUTOVA IMPRESSIONS  1. Left ventricular ejection fraction, by estimation, is <20%. The left ventricle has severely decreased function. The left ventricle demonstrates global hypokinesis. The left ventricular internal cavity size was moderately to severely dilated. Left ventricular diastolic parameters are indeterminate. There is the interventricular septum is flattened in systole and diastole, consistent with right ventricular  pressure and volume overload.  2. Right ventricular systolic function is mildly reduced. The right ventricular size is mildly enlarged. There is mildly elevated pulmonary artery systolic pressure. The estimated right ventricular systolic pressure is 41.0 mmHg.  3. Left atrial size was severely dilated.  4. Right atrial size was mild to moderately dilated.  5. The mitral valve is normal in structure. Mild to moderate mitral valve regurgitation. No evidence of mitral stenosis.  6. The aortic valve is tricuspid. Aortic valve regurgitation is not visualized. Aortic valve sclerosis is present, with no evidence of aortic valve stenosis.  7. The inferior vena cava is dilated in size with <50% respiratory variability, suggesting right atrial pressure of 15 mmHg. Comparison(s): A prior study was performed on 12/02/2022. LVEF was  45-50% see prior report for more details. Conclusion(s)/Recommendation(s): No left ventricular mural or apical thrombus/thrombi. FINDINGS  Left Ventricle: Left ventricular ejection fraction, by estimation, is <20%. The left ventricle has severely decreased function. The left ventricle demonstrates global hypokinesis. Definity contrast agent was given IV to delineate the left ventricular endocardial borders. The left ventricular internal cavity size was moderately to severely dilated. There is no left ventricular hypertrophy. The interventricular septum is flattened in systole and diastole, consistent with right ventricular pressure and volume overload. Left ventricular diastolic function could not be evaluated due to atrial fibrillation. Left ventricular diastolic parameters are indeterminate. Right Ventricle: The right ventricular size is mildly enlarged. No increase in right ventricular wall thickness. Right ventricular systolic function is mildly reduced. There is mildly elevated pulmonary artery systolic pressure. The tricuspid regurgitant  velocity is 2.55 m/s, and with an assumed right atrial pressure of 15 mmHg, the estimated right ventricular systolic pressure is 41.0 mmHg. Left Atrium: Left atrial size was severely dilated. Right Atrium: Right atrial size was mild to moderately dilated. Pericardium: There is no evidence of pericardial effusion. Mitral Valve: The mitral valve is normal in structure. Mild to moderate mitral valve regurgitation, with centrally-directed jet. No evidence of mitral valve stenosis. Tricuspid Valve: The tricuspid valve is normal in structure. Tricuspid valve regurgitation is trivial. Aortic Valve: The aortic valve is tricuspid. Aortic valve regurgitation is not visualized. Aortic valve sclerosis is present, with no evidence of aortic valve stenosis. Pulmonic Valve: The pulmonic valve was normal in structure. Pulmonic valve regurgitation is mild. Aorta: The aortic root and ascending  aorta are structurally normal, with no evidence of dilitation. Venous: The inferior vena cava is dilated in size with less than 50% respiratory variability, suggesting right atrial pressure of 15 mmHg. IAS/Shunts: The atrial septum is grossly normal.  LEFT VENTRICLE PLAX 2D LVIDd:         6.10 cm LVIDs:         5.30 cm LV PW:         0.80 cm LV IVS:        0.80 cm LVOT diam:     2.20 cm LV SV:         46 LV SV Index:   20 LVOT Area:     3.80 cm  LV Volumes (MOD) LV vol d, MOD A2C: 245.0 ml LV vol d, MOD A4C: 229.0 ml LV vol s, MOD A2C: 242.5 ml LV vol s, MOD A4C: 213.0 ml LV SV MOD A2C:     2.5 ml LV SV MOD A4C:     229.0 ml LV SV MOD BP:      16.1 ml RIGHT VENTRICLE RV S prime:     8.38 cm/s  TAPSE (M-mode): 1.4 cm LEFT ATRIUM              Index        RIGHT ATRIUM           Index LA diam:        4.90 cm  2.13 cm/m   RA Area:     27.20 cm LA Vol (A2C):   102.0 ml 44.28 ml/m  RA Volume:   90.20 ml  39.16 ml/m LA Vol (A4C):   130.0 ml 56.44 ml/m LA Biplane Vol: 117.0 ml 50.79 ml/m  AORTIC VALVE LVOT Vmax:   71.10 cm/s LVOT Vmean:  49.000 cm/s LVOT VTI:    0.120 m  AORTA Ao Root diam: 3.50 cm Ao Asc diam:  3.50 cm TRICUSPID VALVE TR Peak grad:   26.0 mmHg TR Vmax:        255.00 cm/s  SHUNTS Systemic VTI:  0.12 m Systemic Diam: 2.20 cm Sunit Tolia Electronically signed by Tessa Lerner Signature Date/Time: 09/11/2023/4:11:58 PM    Final    DG Chest Port 1 View Result Date: 09/10/2023 CLINICAL DATA:  Shortness of breath and leg swelling. EXAM: PORTABLE CHEST 1 VIEW COMPARISON:  12/25/2017 FINDINGS: Shallow inspiration. Cardiac enlargement. Pulmonary vascular congestion. Developing small effusions and basilar atelectasis or infiltration. This could indicate compressive atelectasis or edema. Pneumonia would be another possibility. Old right rib fractures. Mediastinal contours appear intact. IMPRESSION: Cardiac enlargement with pulmonary vascular congestion and infiltration in the lung bases. Small bilateral pleural  effusions. Electronically Signed   By: Burman Nieves M.D.   On: 09/10/2023 20:29    Microbiology: Recent Results (from the past 240 hours)  Resp panel by RT-PCR (RSV, Flu A&B, Covid) Anterior Nasal Swab     Status: None   Collection Time: 09/10/23  7:04 PM   Specimen: Anterior Nasal Swab  Result Value Ref Range Status   SARS Coronavirus 2 by RT PCR NEGATIVE NEGATIVE Final   Influenza A by PCR NEGATIVE NEGATIVE Final   Influenza B by PCR NEGATIVE NEGATIVE Final    Comment: (NOTE) The Xpert Xpress SARS-CoV-2/FLU/RSV plus assay is intended as an aid in the diagnosis of influenza from Nasopharyngeal swab specimens and should not be used as a sole basis for treatment. Nasal washings and aspirates are unacceptable for Xpert Xpress SARS-CoV-2/FLU/RSV testing.  Fact Sheet for Patients: BloggerCourse.com  Fact Sheet for Healthcare Providers: SeriousBroker.it  This test is not yet approved or cleared by the [place] FDA and has been authorized for detection and/or diagnosis of SARS-CoV-2 by FDA under an Emergency Use Authorization (EUA). This EUA will remain in effect (meaning this test can be used) for the duration of the COVID-19 declaration under Section 564(b)(1) of the Act, 21 U.S.C. section 360bbb-3(b)(1), unless the authorization is terminated or revoked.     Resp Syncytial Virus by PCR NEGATIVE NEGATIVE Final    Comment: (NOTE) Fact Sheet for Patients: BloggerCourse.com  Fact Sheet for Healthcare Providers: SeriousBroker.it  This test is not yet approved or cleared by the [place] FDA and has been authorized for detection and/or diagnosis of SARS-CoV-2 by FDA under an Emergency Use Authorization (EUA). This EUA will remain in effect (meaning this test can be used) for the duration of the COVID-19 declaration under Section 564(b)(1) of the Act, 21 U.S.C. section  360bbb-3(b)(1), unless the authorization is terminated or revoked.  Performed at [facility], [address] [place], [place] [address]      Labs: Basic  Metabolic Panel: Recent Labs  Lab 09/10/23 1928 09/11/23 0350 09/12/23 0347 09/13/23 0430  NA 141 141 141 142  K 4.1 3.8 3.9 3.7  CL 106 106 109 108  CO2 26 24 25 27   GLUCOSE 105* 98 105* 97  BUN 21 19 18 15   CREATININE 1.23 1.03 1.11 1.00  CALCIUM 9.0 8.7* 8.6* 8.5*  MG 2.2 2.0 2.0 1.9  PHOS 3.5 3.0 3.6 3.6   Liver Function Tests: Recent Labs  Lab 09/10/23 1928 09/11/23 0350 09/12/23 0347 09/13/23 0430  AST 20  19 17   --   --   ALT <5  <5 6  --   --   ALKPHOS 55  56 54  --   --   BILITOT 0.9  0.9 1.0  --   --   PROT 6.6  6.7 5.7*  --   --   ALBUMIN 3.4*  3.5 3.2* 3.0* 2.8*   No results for input(s): "LIPASE", "AMYLASE" in the last 168 hours. No results for input(s): "AMMONIA" in the last 168 hours. CBC: Recent Labs  Lab 09/10/23 1928 09/11/23 0350 09/12/23 0347 09/13/23 0430  WBC 3.9* 3.8* 6.0 3.7*  HGB 12.4* 12.0* 12.0* 11.7*  HCT 38.7* 37.0* 36.5* 36.1*  MCV 103.2* 103.1* 101.1* 101.1*  PLT 178 167 171 161   Cardiac Enzymes: No results for input(s): "CKTOTAL", "CKMB", "CKMBINDEX", "TROPONINI" in the last 168 hours. D-Dimer No results for input(s): "DDIMER" in the last 72 hours. BNP: Invalid input(s): "POCBNP" CBG: No results for input(s): "GLUCAP" in the last 168 hours. Anemia work up No results for input(s): "VITAMINB12", "FOLATE", "FERRITIN", "TIBC", "IRON", "RETICCTPCT" in the last 72 hours. Urinalysis    Component Value Date/Time   COLORURINE YELLOW 09/11/2023 0423   APPEARANCEUR CLEAR 09/11/2023 0423   LABSPEC 1.023 09/11/2023 0423   PHURINE 5.0 09/11/2023 0423   GLUCOSEU NEGATIVE 09/11/2023 0423   HGBUR NEGATIVE 09/11/2023 0423   BILIRUBINUR NEGATIVE 09/11/2023 0423   BILIRUBINUR negative 09/23/2016 1432   BILIRUBINUR neg 06/28/2013 1518   KETONESUR 5 (A) 09/11/2023  0423   PROTEINUR NEGATIVE 09/11/2023 0423   UROBILINOGEN 0.2 09/23/2016 1432   UROBILINOGEN 0.2 12/03/2009 0043   NITRITE NEGATIVE 09/11/2023 0423   LEUKOCYTESUR NEGATIVE 09/11/2023 0423   Sepsis Labs Recent Labs  Lab 09/10/23 1928 09/11/23 0350 09/12/23 0347 09/13/23 0430  WBC 3.9* 3.8* 6.0 3.7*       SIGNED:  Marinda Elk, MD  [facility] 09/06/2023, 10:12 AM Pager   If 7PM-7AM, please contact night-coverage [email or web address] Password [email or web address]

## 2023-09-15 NOTE — Discharge Summary (Deleted)
 Physician Discharge Summary  Stanley Wright:[id number] DOB: 1945-12-02 DOA: 09/10/2023  PCP: [facility]  Admit date: 09/10/2023 Discharge date: 08/29/2023  Admitted From: Home Disposition:  Residential Hosp[facility]  Recommendations for Outpatient Follow-up:  None  Home Health:No Equipment/Devices:None  Discharge Condition:Stable CODE STATUS:Full Diet recommendation: Heart Healthy   Brief/Interim Summary: 78 y.o. 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 Cardiology was consulted he was started on IV Lasix and dig.  Cardiology is planning for Right and left heart cath on 09/10/2023. INR is acceptable.  Discharge Diagnoses:  Principal Problem:   Atrial fibrillation with RVR (HCC) Active Problems:   Factor V Leiden (HCC)   Persistent atrial fibrillation (HCC)   Morbid obesity due to excess calories (HCC)   Parkinson's disease (HCC)   History of pulmonary embolism   Lower extremity edema   Acute on chronic systolic CHF (congestive heart failure) (HCC)   DNR (do not resuscitate)   Advance care planning  Acute on chronic systolic and diastolic heart failure 2D echo was done that showed low EF cardiology was consulted will start on IV Lasix they recommended right heart cath on 08/30/2023 he diuresed about 3 L. Palliative Care was consulted due to his poor prognosis. On the night of 09/13/2023 he got severely short of breath. The family decided to move towards comfort measures all medications not related to comfort were discontinued. He was started on morphine drip. He appears comfortable.  Chronic A-fib with RVR: All medications not related to comfort  were discontinued.  Factor V Leiden (HCC)/PE DVT: INR 2.2, see above A-fib for further details   Parkinson disease: Deconditioning: BPH: Psoriasis:   Discharge Instructions  Discharge Instructions     Diet - low sodium heart healthy   Complete by: As directed    Increase activity slowly   Complete by: As directed       Allergies as of 08/24/2023       Reactions   Tylenol [acetaminophen] Other (See Comments)   Pt states he had a DNA test completed that stated he should never take tylenol.        Medication List     STOP taking these medications    carbidopa-levodopa 25-100 MG tablet Commonly known as: SINEMET IR   carbidopa-levodopa 50-200 MG tablet Commonly known as: SINEMET CR   clonazePAM 0.5 MG tablet Commonly known as: KLONOPIN   diltiazem 360 MG 24 hr capsule Commonly known as: CARDIZEM CD   furosemide 40 MG tablet Commonly known as: LASIX   hydroxychloroquine 200 MG tablet Commonly known as: PLAQUENIL   pramipexole 0.25 MG tablet Commonly known as: MIRAPEX   warfarin 5 MG tablet Commonly known as: COUMADIN       TAKE these medications    LORazepam 2 MG/ML concentrated solution Commonly known as: ATIVAN Place 0.5 mLs (1 mg total) under the tongue every 4 (four) hours as needed for anxiety, seizure or sleep.   morphine 1 mg/mL Soln infusion Inject 1-6 mg/hr into the vein continuous.        Allergies  Allergen Reactions   Tylenol [Acetaminophen] Other (See Comments)    Pt states he had a DNA test completed that stated  he should never take tylenol.    Consultations: Palliative care Cardiology   Procedures/Studies: ECHOCARDIOGRAM COMPLETE Result Date: 09/11/2023    ECHOCARDIOGRAM REPORT   Patient Name:   Stanley Wright Date of Exam: 09/11/2023 Medical Rec #:  [id number]       Height:       72.0 in Accession #:    [id number]      Weight:       240.3 lb Date of Birth:  Dec 17, 1945       BSA:          2.303 m Patient Age:    78 years         BP:           83/59 mmHg Patient Gender: M               HR:           75 bpm. Exam Location:  Inpatient Procedure: 2D Echo, Color Doppler, Cardiac Doppler and Intracardiac            Opacification Agent (Both Spectral and Color Flow Doppler were            utilized during procedure). Indications:    I48.91* Unspecified atrial fibrillation  History:        Patient has prior history of Echocardiogram examinations, most                 recent 12/02/2022. Arrythmias:Atrial Fibrillation,                 Signs/Symptoms:Parkinson's; Risk Factors:Sleep Apnea.  Sonographer:    Irving Burton Senior RDCS Referring Phys: Consepcion Hearing ANASTASSIA DOUTOVA IMPRESSIONS  1. Left ventricular ejection fraction, by estimation, is <20%. The left ventricle has severely decreased function. The left ventricle demonstrates global hypokinesis. The left ventricular internal cavity size was moderately to severely dilated. Left ventricular diastolic parameters are indeterminate. There is the interventricular septum is flattened in systole and diastole, consistent with right ventricular pressure and volume overload.  2. Right ventricular systolic function is mildly reduced. The right ventricular size is mildly enlarged. There is mildly elevated pulmonary artery systolic pressure. The estimated right ventricular systolic pressure is 41.0 mmHg.  3. Left atrial size was severely dilated.  4. Right atrial size was mild to moderately dilated.  5. The mitral valve is normal in structure. Mild to moderate mitral valve regurgitation. No evidence of mitral stenosis.  6. The aortic valve is tricuspid. Aortic valve regurgitation is not visualized. Aortic valve sclerosis is present, with no evidence of aortic valve stenosis.  7. The inferior vena cava is dilated in size with <50% respiratory variability, suggesting right atrial pressure of 15 mmHg. Comparison(s): A prior study was performed on 12/02/2022. LVEF was 45-50% see prior report for more details.  Conclusion(s)/Recommendation(s): No left ventricular mural or apical thrombus/thrombi. FINDINGS  Left Ventricle: Left ventricular ejection fraction, by estimation, is <20%. The left ventricle has severely decreased function. The left ventricle demonstrates global hypokinesis. Definity contrast agent was given IV to delineate the left ventricular endocardial borders. The left ventricular internal cavity size was moderately to severely dilated. There is no left ventricular hypertrophy. The interventricular septum is flattened in systole and diastole, consistent with right ventricular pressure and volume overload. Left ventricular diastolic function could not be evaluated due to atrial fibrillation. Left ventricular diastolic parameters are indeterminate. Right Ventricle: The right ventricular size is mildly enlarged. No increase in right ventricular wall thickness. Right ventricular systolic function is mildly reduced.  There is mildly elevated pulmonary artery systolic pressure. The tricuspid regurgitant  velocity is 2.55 m/s, and with an assumed right atrial pressure of 15 mmHg, the estimated right ventricular systolic pressure is 41.0 mmHg. Left Atrium: Left atrial size was severely dilated. Right Atrium: Right atrial size was mild to moderately dilated. Pericardium: There is no evidence of pericardial effusion. Mitral Valve: The mitral valve is normal in structure. Mild to moderate mitral valve regurgitation, with centrally-directed jet. No evidence of mitral valve stenosis. Tricuspid Valve: The tricuspid valve is normal in structure. Tricuspid valve regurgitation is trivial. Aortic Valve: The aortic valve is tricuspid. Aortic valve regurgitation is not visualized. Aortic valve sclerosis is present, with no evidence of aortic valve stenosis. Pulmonic Valve: The pulmonic valve was normal in structure. Pulmonic valve regurgitation is mild. Aorta: The aortic root and ascending aorta are structurally normal, with no  evidence of dilitation. Venous: The inferior vena cava is dilated in size with less than 50% respiratory variability, suggesting right atrial pressure of 15 mmHg. IAS/Shunts: The atrial septum is grossly normal.  LEFT VENTRICLE PLAX 2D LVIDd:         6.10 cm LVIDs:         5.30 cm LV PW:         0.80 cm LV IVS:        0.80 cm LVOT diam:     2.20 cm LV SV:         46 LV SV Index:   20 LVOT Area:     3.80 cm  LV Volumes (MOD) LV vol d, MOD A2C: 245.0 ml LV vol d, MOD A4C: 229.0 ml LV vol s, MOD A2C: 242.5 ml LV vol s, MOD A4C: 213.0 ml LV SV MOD A2C:     2.5 ml LV SV MOD A4C:     229.0 ml LV SV MOD BP:      16.1 ml RIGHT VENTRICLE RV S prime:     8.38 cm/s TAPSE (M-mode): 1.4 cm LEFT ATRIUM              Index        RIGHT ATRIUM           Index LA diam:        4.90 cm  2.13 cm/m   RA Area:     27.20 cm LA Vol (A2C):   102.0 ml 44.28 ml/m  RA Volume:   90.20 ml  39.16 ml/m LA Vol (A4C):   130.0 ml 56.44 ml/m LA Biplane Vol: 117.0 ml 50.79 ml/m  AORTIC VALVE LVOT Vmax:   71.10 cm/s LVOT Vmean:  49.000 cm/s LVOT VTI:    0.120 m  AORTA Ao Root diam: 3.50 cm Ao Asc diam:  3.50 cm TRICUSPID VALVE TR Peak grad:   26.0 mmHg TR Vmax:        255.00 cm/s  SHUNTS Systemic VTI:  0.12 m Systemic Diam: 2.20 cm Sunit Tolia Electronically signed by Tessa Lerner Signature Date/Time: 09/11/2023/4:11:58 PM    Final    DG Chest Port 1 View Result Date: 09/10/2023 CLINICAL DATA:  Shortness of breath and leg swelling. EXAM: PORTABLE CHEST 1 VIEW COMPARISON:  12/25/2017 FINDINGS: Shallow inspiration. Cardiac enlargement. Pulmonary vascular congestion. Developing small effusions and basilar atelectasis or infiltration. This could indicate compressive atelectasis or edema. Pneumonia would be another possibility. Old right rib fractures. Mediastinal contours appear intact. IMPRESSION: Cardiac enlargement with pulmonary vascular congestion and infiltration in the lung bases. Small bilateral pleural effusions.  Electronically Signed   By:  Burman Nieves M.D.   On: 09/10/2023 20:29   (Echo, Carotid, EGD, Colonoscopy, ERCP)    Subjective: Comfortable in bed sleeping  Discharge Exam: Vitals:   09/13/23 0730 09/13/23 1301  BP: 100/76 92/81  Pulse: (!) 103 (!) 125  Resp: 19 20  Temp: 98 F (36.7 C) 98.5 F (36.9 C)  SpO2:     Vitals:   09/12/23 2314 09/13/23 0512 09/13/23 0730 09/13/23 1301  BP: 103/89 97/77 100/76 92/81  Pulse: (!) 34 (!) 121 (!) 103 (!) 125  Resp: (!) 22 20 19 20   Temp: 98.2 F (36.8 C) 98.3 F (36.8 C) 98 F (36.7 C) 98.5 F (36.9 C)  TempSrc: Oral Oral Oral Oral  SpO2: 91% 97%    Weight:  105 kg    Height:        General: Pt is alert, awake, not in acute distress Cardiovascular: RRR, S1/S2 +, no rubs, no gallops Respiratory: CTA bilaterally, no wheezing, no rhonchi Abdominal: Soft, NT, ND, bowel sounds + Extremities: no edema, no cyanosis    The results of significant diagnostics from this hospitalization (including imaging, microbiology, ancillary and laboratory) are listed below for reference.     Microbiology: Recent Results (from the past 240 hours)  Resp panel by RT-PCR (RSV, Flu A&B, Covid) Anterior Nasal Swab     Status: None   Collection Time: 09/10/23  7:04 PM   Specimen: Anterior Nasal Swab  Result Value Ref Range Status   SARS Coronavirus 2 by RT PCR NEGATIVE NEGATIVE Final   Influenza A by PCR NEGATIVE NEGATIVE Final   Influenza B by PCR NEGATIVE NEGATIVE Final    Comment: (NOTE) The Xpert Xpress SARS-CoV-2/FLU/RSV plus assay is intended as an aid in the diagnosis of influenza from Nasopharyngeal swab specimens and should not be used as a sole basis for treatment. Nasal washings and aspirates are unacceptable for Xpert Xpress SARS-CoV-2/FLU/RSV testing.  Fact Sheet for Patients: BloggerCourse.com  Fact Sheet for Healthcare Providers: SeriousBroker.it  This test is not yet approved or cleared by the [place] FDA and has been authorized for detection and/or diagnosis of SARS-CoV-2 by FDA under an Emergency Use Authorization (EUA). This EUA will remain in effect (meaning this test can be used) for the duration of the COVID-19 declaration under Section 564(b)(1) of the Act, 21 U.S.C. section 360bbb-3(b)(1), unless the authorization is terminated or revoked.     Resp Syncytial Virus by PCR NEGATIVE NEGATIVE Final    Comment: (NOTE) Fact Sheet for Patients: BloggerCourse.com  Fact Sheet for Healthcare Providers: SeriousBroker.it  This test is not yet approved or cleared by the [place] FDA and has been authorized for detection and/or diagnosis of SARS-CoV-2 by FDA under an Emergency Use Authorization (EUA). This EUA will remain in effect (meaning this test can be used) for the duration of the COVID-19 declaration under Section 564(b)(1) of the Act, 21 U.S.C. section 360bbb-3(b)(1), unless the authorization is terminated or revoked.  Performed at [facility], [address], [place], [place] [address]      Labs: BNP (last 3 results) Recent Labs    09/10/23 1928  BNP 938.1*   Basic Metabolic Panel: Recent Labs  Lab 09/10/23 1928 09/11/23 0350 09/12/23 0347 09/13/23 0430  NA 141 141 141 142  K 4.1 3.8 3.9 3.7  CL 106 106 109 108  CO2 26 24 25 27   GLUCOSE 105* 98 105* 97  BUN 21 19 18  15  CREATININE 1.23 1.03 1.11 1.00  CALCIUM 9.0 8.7* 8.6* 8.5*  MG 2.2 2.0 2.0 1.9  PHOS 3.5 3.0 3.6 3.6   Liver Function Tests: Recent Labs  Lab 09/10/23 1928 09/11/23 0350 09/12/23 0347 09/13/23 0430  AST 20  19 17   --   --   ALT <5  <5 6  --   --   ALKPHOS 55  56 54  --   --   BILITOT 0.9  0.9 1.0  --   --   PROT 6.6  6.7 5.7*  --   --   ALBUMIN 3.4*  3.5 3.2* 3.0* 2.8*   No results for input(s): "LIPASE", "AMYLASE" in the last 168 hours. No results for input(s): "AMMONIA" in the last 168  hours. CBC: Recent Labs  Lab 09/10/23 1928 09/11/23 0350 09/12/23 0347 09/13/23 0430  WBC 3.9* 3.8* 6.0 3.7*  HGB 12.4* 12.0* 12.0* 11.7*  HCT 38.7* 37.0* 36.5* 36.1*  MCV 103.2* 103.1* 101.1* 101.1*  PLT 178 167 171 161   Cardiac Enzymes: No results for input(s): "CKTOTAL", "CKMB", "CKMBINDEX", "TROPONINI" in the last 168 hours. BNP: Invalid input(s): "POCBNP" CBG: No results for input(s): "GLUCAP" in the last 168 hours. D-Dimer No results for input(s): "DDIMER" in the last 72 hours. Hgb A1c No results for input(s): "HGBA1C" in the last 72 hours. Lipid Profile No results for input(s): "CHOL", "HDL", "LDLCALC", "TRIG", "CHOLHDL", "LDLDIRECT" in the last 72 hours. Thyroid function studies No results for input(s): "TSH", "T4TOTAL", "T3FREE", "THYROIDAB" in the last 72 hours.  Invalid input(s): "FREET3" Anemia work up No results for input(s): "VITAMINB12", "FOLATE", "FERRITIN", "TIBC", "IRON", "RETICCTPCT" in the last 72 hours. Urinalysis    Component Value Date/Time   COLORURINE YELLOW 09/11/2023 0423   APPEARANCEUR CLEAR 09/11/2023 0423   LABSPEC 1.023 09/11/2023 0423   PHURINE 5.0 09/11/2023 0423   GLUCOSEU NEGATIVE 09/11/2023 0423   HGBUR NEGATIVE 09/11/2023 0423   BILIRUBINUR NEGATIVE 09/11/2023 0423   BILIRUBINUR negative 09/23/2016 1432   BILIRUBINUR neg 06/28/2013 1518   KETONESUR 5 (A) 09/11/2023 0423   PROTEINUR NEGATIVE 09/11/2023 0423   UROBILINOGEN 0.2 09/23/2016 1432   UROBILINOGEN 0.2 12/03/2009 0043   NITRITE NEGATIVE 09/11/2023 0423   LEUKOCYTESUR NEGATIVE 09/11/2023 0423   Sepsis Labs Recent Labs  Lab 09/10/23 1928 09/11/23 0350 09/12/23 0347 09/13/23 0430  WBC 3.9* 3.8* 6.0 3.7*   Microbiology Recent Results (from the past 240 hours)  Resp panel by RT-PCR (RSV, Flu A&B, Covid) Anterior Nasal Swab     Status: None   Collection Time: 09/10/23  7:04 PM   Specimen: Anterior Nasal Swab  Result Value Ref Range Status   SARS Coronavirus 2 by  RT PCR NEGATIVE NEGATIVE Final   Influenza A by PCR NEGATIVE NEGATIVE Final   Influenza B by PCR NEGATIVE NEGATIVE Final    Comment: (NOTE) The Xpert Xpress SARS-CoV-2/FLU/RSV plus assay is intended as an aid in the diagnosis of influenza from Nasopharyngeal swab specimens and should not be used as a sole basis for treatment. Nasal washings and aspirates are unacceptable for Xpert Xpress SARS-CoV-2/FLU/RSV testing.  Fact Sheet for Patients: BloggerCourse.com  Fact Sheet for Healthcare Providers: SeriousBroker.it  This test is not yet approved or cleared by the [place] FDA and has been authorized for detection and/or diagnosis of SARS-CoV-2 by FDA under an Emergency Use Authorization (EUA). This EUA will remain in effect (meaning this test can be used) for the duration of the COVID-19 declaration under Section 564(b)(1) of the Act,  21 U.S.C. section 360bbb-3(b)(1), unless the authorization is terminated or revoked.     Resp Syncytial Virus by PCR NEGATIVE NEGATIVE Final    Comment: (NOTE) Fact Sheet for Patients: BloggerCourse.com  Fact Sheet for Healthcare Providers: SeriousBroker.it  This test is not yet approved or cleared by the [place] FDA and has been authorized for detection and/or diagnosis of SARS-CoV-2 by FDA under an Emergency Use Authorization (EUA). This EUA will remain in effect (meaning this test can be used) for the duration of the COVID-19 declaration under Section 564(b)(1) of the Act, 21 U.S.C. section 360bbb-3(b)(1), unless the authorization is terminated or revoked.  Performed at [facility], [address] [place], [place] [address]      Time coordinating discharge: Over 35 minutes  SIGNED:   Marinda Elk, MD  [facility] 08/18/2023, 9:26 AM Pager   If 7PM-7AM, please contact  night-coverage [email or web address] Password [email or web address]

## 2023-09-15 DEATH — deceased

## 2023-10-12 ENCOUNTER — Ambulatory Visit: Payer: HMO | Admitting: Neurology

## 2023-10-15 ENCOUNTER — Ambulatory Visit: Payer: HMO | Admitting: Neurology

## 2023-11-12 ENCOUNTER — Ambulatory Visit: Admitting: Family Medicine
